# Patient Record
Sex: Female | Born: 1950
Health system: Southern US, Community
[De-identification: ages and names within clinical notes are randomized; demographics above are authoritative.]

## PROBLEM LIST (undated history)

## (undated) DIAGNOSIS — I447 Left bundle-branch block, unspecified: Secondary | ICD-10-CM

## (undated) DIAGNOSIS — K589 Irritable bowel syndrome without diarrhea: Secondary | ICD-10-CM

## (undated) DIAGNOSIS — K219 Gastro-esophageal reflux disease without esophagitis: Secondary | ICD-10-CM

## (undated) DIAGNOSIS — I10 Essential (primary) hypertension: Secondary | ICD-10-CM

## (undated) DIAGNOSIS — E039 Hypothyroidism, unspecified: Secondary | ICD-10-CM

## (undated) DIAGNOSIS — N183 Chronic kidney disease, stage 3 unspecified: Secondary | ICD-10-CM

## (undated) DIAGNOSIS — Z8673 Personal history of transient ischemic attack (TIA), and cerebral infarction without residual deficits: Secondary | ICD-10-CM

## (undated) DIAGNOSIS — G43909 Migraine, unspecified, not intractable, without status migrainosus: Secondary | ICD-10-CM

## (undated) DIAGNOSIS — E785 Hyperlipidemia, unspecified: Secondary | ICD-10-CM

## (undated) DIAGNOSIS — F429 Obsessive-compulsive disorder, unspecified: Secondary | ICD-10-CM

## (undated) DIAGNOSIS — I639 Cerebral infarction, unspecified: Secondary | ICD-10-CM

## (undated) DIAGNOSIS — E782 Mixed hyperlipidemia: Secondary | ICD-10-CM

## (undated) DIAGNOSIS — N390 Urinary tract infection, site not specified: Secondary | ICD-10-CM

## (undated) DIAGNOSIS — K582 Mixed irritable bowel syndrome: Secondary | ICD-10-CM

## (undated) DIAGNOSIS — J302 Other seasonal allergic rhinitis: Secondary | ICD-10-CM

## (undated) DIAGNOSIS — Z87448 Personal history of other diseases of urinary system: Secondary | ICD-10-CM

## (undated) DIAGNOSIS — R7303 Prediabetes: Secondary | ICD-10-CM

## (undated) DIAGNOSIS — K5790 Diverticulosis of intestine, part unspecified, without perforation or abscess without bleeding: Secondary | ICD-10-CM

## (undated) DIAGNOSIS — Z8719 Personal history of other diseases of the digestive system: Secondary | ICD-10-CM

## (undated) DIAGNOSIS — K449 Diaphragmatic hernia without obstruction or gangrene: Secondary | ICD-10-CM

## (undated) DIAGNOSIS — K4020 Bilateral inguinal hernia, without obstruction or gangrene, not specified as recurrent: Secondary | ICD-10-CM

## (undated) DIAGNOSIS — K5730 Diverticulosis of large intestine without perforation or abscess without bleeding: Secondary | ICD-10-CM

## (undated) DIAGNOSIS — G4733 Obstructive sleep apnea (adult) (pediatric): Secondary | ICD-10-CM

## (undated) DIAGNOSIS — F909 Attention-deficit hyperactivity disorder, unspecified type: Secondary | ICD-10-CM

## (undated) DIAGNOSIS — G459 Transient cerebral ischemic attack, unspecified: Secondary | ICD-10-CM

## (undated) DIAGNOSIS — M8580 Other specified disorders of bone density and structure, unspecified site: Secondary | ICD-10-CM

## (undated) DIAGNOSIS — T7840XA Allergy, unspecified, initial encounter: Secondary | ICD-10-CM

## (undated) DIAGNOSIS — I509 Heart failure, unspecified: Secondary | ICD-10-CM

## (undated) DIAGNOSIS — J439 Emphysema, unspecified: Secondary | ICD-10-CM

## (undated) DIAGNOSIS — H269 Unspecified cataract: Secondary | ICD-10-CM

## (undated) DIAGNOSIS — K648 Other hemorrhoids: Secondary | ICD-10-CM

## (undated) DIAGNOSIS — A419 Sepsis, unspecified organism: Secondary | ICD-10-CM

## (undated) DIAGNOSIS — N3010 Interstitial cystitis (chronic) without hematuria: Secondary | ICD-10-CM

## (undated) DIAGNOSIS — D649 Anemia, unspecified: Secondary | ICD-10-CM

## (undated) DIAGNOSIS — A048 Other specified bacterial intestinal infections: Secondary | ICD-10-CM

## (undated) DIAGNOSIS — Z87440 Personal history of urinary (tract) infections: Secondary | ICD-10-CM

## (undated) DIAGNOSIS — Z8619 Personal history of other infectious and parasitic diseases: Secondary | ICD-10-CM

## (undated) DIAGNOSIS — I5032 Chronic diastolic (congestive) heart failure: Secondary | ICD-10-CM

## (undated) DIAGNOSIS — M810 Age-related osteoporosis without current pathological fracture: Secondary | ICD-10-CM

## (undated) DIAGNOSIS — N289 Disorder of kidney and ureter, unspecified: Secondary | ICD-10-CM

## (undated) DIAGNOSIS — F419 Anxiety disorder, unspecified: Secondary | ICD-10-CM

## (undated) DIAGNOSIS — F32A Depression, unspecified: Secondary | ICD-10-CM

## (undated) DIAGNOSIS — G4730 Sleep apnea, unspecified: Secondary | ICD-10-CM

## (undated) DIAGNOSIS — E559 Vitamin D deficiency, unspecified: Secondary | ICD-10-CM

## (undated) DIAGNOSIS — E079 Disorder of thyroid, unspecified: Secondary | ICD-10-CM

## (undated) HISTORY — DX: Unspecified cataract: H26.9

## (undated) HISTORY — PX: AUGMENTATION MAMMAPLASTY: SUR837

## (undated) HISTORY — DX: Anxiety disorder, unspecified: F41.9

## (undated) HISTORY — PX: COLON SURGERY: SHX602

## (undated) HISTORY — DX: Vitamin D deficiency, unspecified: E55.9

## (undated) HISTORY — DX: Prediabetes: R73.03

## (undated) HISTORY — DX: Allergy, unspecified, initial encounter: T78.40XA

## (undated) HISTORY — DX: Heart failure, unspecified: I50.9

## (undated) HISTORY — DX: Anemia, unspecified: D64.9

## (undated) HISTORY — PX: BREAST ENHANCEMENT SURGERY: SHX7

## (undated) HISTORY — DX: Personal history of other diseases of the digestive system: Z87.19

## (undated) HISTORY — PX: OOPHORECTOMY: SHX86

## (undated) HISTORY — DX: Migraine, unspecified, not intractable, without status migrainosus: G43.909

## (undated) HISTORY — DX: Cerebral infarction, unspecified: I63.9

## (undated) HISTORY — DX: Attention-deficit hyperactivity disorder, unspecified type: F90.9

## (undated) HISTORY — DX: Hyperlipidemia, unspecified: E78.5

## (undated) HISTORY — PX: HERNIA REPAIR: SHX51

## (undated) HISTORY — DX: Transient cerebral ischemic attack, unspecified: G45.9

## (undated) HISTORY — PX: ABDOMINAL HYSTERECTOMY: SHX81

## (undated) HISTORY — DX: Other specified bacterial intestinal infections: A04.8

## (undated) HISTORY — DX: Age-related osteoporosis without current pathological fracture: M81.0

## (undated) HISTORY — PX: BREAST SURGERY: SHX581

## (undated) HISTORY — PX: COSMETIC SURGERY: SHX468

## (undated) HISTORY — PX: FRACTURE SURGERY: SHX138

## (undated) HISTORY — DX: Gastro-esophageal reflux disease without esophagitis: K21.9

## (undated) HISTORY — PX: TONSILLECTOMY: SUR1361

## (undated) HISTORY — DX: Emphysema, unspecified: J43.9

## (undated) HISTORY — PX: TUBAL LIGATION: SHX77

## (undated) HISTORY — DX: Disorder of thyroid, unspecified: E07.9

## (undated) HISTORY — PX: EYE SURGERY: SHX253

---

## 1974-04-27 HISTORY — PX: HEMORRHOID SURGERY: SHX153

## 1995-04-28 HISTORY — PX: BUNIONECTOMY: SHX129

## 1997-08-31 ENCOUNTER — Ambulatory Visit (HOSPITAL_COMMUNITY): Admission: RE | Admit: 1997-08-31 | Discharge: 1997-08-31 | Payer: Self-pay | Admitting: Internal Medicine

## 1998-05-01 ENCOUNTER — Ambulatory Visit (HOSPITAL_COMMUNITY): Admission: RE | Admit: 1998-05-01 | Discharge: 1998-05-01 | Payer: Self-pay | Admitting: Internal Medicine

## 1998-05-01 ENCOUNTER — Encounter: Payer: Self-pay | Admitting: Internal Medicine

## 1998-05-09 ENCOUNTER — Encounter: Payer: Self-pay | Admitting: Internal Medicine

## 1998-05-09 ENCOUNTER — Ambulatory Visit (HOSPITAL_COMMUNITY): Admission: RE | Admit: 1998-05-09 | Discharge: 1998-05-09 | Payer: Self-pay | Admitting: Internal Medicine

## 1998-05-20 ENCOUNTER — Ambulatory Visit (HOSPITAL_COMMUNITY): Admission: RE | Admit: 1998-05-20 | Discharge: 1998-05-20 | Payer: Self-pay | Admitting: Internal Medicine

## 1998-12-17 ENCOUNTER — Encounter: Payer: Self-pay | Admitting: Internal Medicine

## 1998-12-17 ENCOUNTER — Ambulatory Visit (HOSPITAL_COMMUNITY): Admission: RE | Admit: 1998-12-17 | Discharge: 1998-12-17 | Payer: Self-pay | Admitting: Internal Medicine

## 1999-01-23 ENCOUNTER — Ambulatory Visit (HOSPITAL_COMMUNITY): Admission: RE | Admit: 1999-01-23 | Discharge: 1999-01-23 | Payer: Self-pay | Admitting: Internal Medicine

## 1999-01-23 ENCOUNTER — Encounter: Payer: Self-pay | Admitting: Internal Medicine

## 1999-08-09 ENCOUNTER — Ambulatory Visit: Admission: RE | Admit: 1999-08-09 | Discharge: 1999-08-09 | Payer: Self-pay | Admitting: Neurology

## 2000-03-29 ENCOUNTER — Encounter: Payer: Self-pay | Admitting: *Deleted

## 2000-04-02 ENCOUNTER — Inpatient Hospital Stay (HOSPITAL_COMMUNITY): Admission: RE | Admit: 2000-04-02 | Discharge: 2000-04-03 | Payer: Self-pay | Admitting: *Deleted

## 2000-04-02 ENCOUNTER — Encounter (INDEPENDENT_AMBULATORY_CARE_PROVIDER_SITE_OTHER): Payer: Self-pay | Admitting: Specialist

## 2001-09-30 ENCOUNTER — Encounter: Payer: Self-pay | Admitting: Internal Medicine

## 2001-09-30 ENCOUNTER — Encounter: Admission: RE | Admit: 2001-09-30 | Discharge: 2001-09-30 | Payer: Self-pay | Admitting: Internal Medicine

## 2001-10-10 ENCOUNTER — Encounter: Payer: Self-pay | Admitting: Internal Medicine

## 2001-10-10 ENCOUNTER — Ambulatory Visit (HOSPITAL_COMMUNITY): Admission: RE | Admit: 2001-10-10 | Discharge: 2001-10-10 | Payer: Self-pay | Admitting: Internal Medicine

## 2002-10-05 ENCOUNTER — Encounter: Payer: Self-pay | Admitting: Internal Medicine

## 2002-10-05 ENCOUNTER — Ambulatory Visit (HOSPITAL_COMMUNITY): Admission: RE | Admit: 2002-10-05 | Discharge: 2002-10-05 | Payer: Self-pay | Admitting: Internal Medicine

## 2002-10-09 ENCOUNTER — Encounter: Payer: Self-pay | Admitting: Internal Medicine

## 2002-10-09 ENCOUNTER — Ambulatory Visit (HOSPITAL_COMMUNITY): Admission: RE | Admit: 2002-10-09 | Discharge: 2002-10-09 | Payer: Self-pay | Admitting: Internal Medicine

## 2003-04-28 HISTORY — PX: CATARACT EXTRACTION W/ INTRAOCULAR LENS IMPLANT: SHX1309

## 2003-08-30 ENCOUNTER — Ambulatory Visit (HOSPITAL_COMMUNITY): Admission: RE | Admit: 2003-08-30 | Discharge: 2003-08-30 | Payer: Self-pay | Admitting: Internal Medicine

## 2004-06-11 ENCOUNTER — Encounter: Admission: RE | Admit: 2004-06-11 | Discharge: 2004-06-11 | Payer: Self-pay | Admitting: Internal Medicine

## 2005-08-13 ENCOUNTER — Ambulatory Visit (HOSPITAL_COMMUNITY): Admission: RE | Admit: 2005-08-13 | Discharge: 2005-08-13 | Payer: Self-pay | Admitting: Internal Medicine

## 2005-08-26 ENCOUNTER — Encounter: Payer: Self-pay | Admitting: Internal Medicine

## 2006-02-25 ENCOUNTER — Ambulatory Visit: Payer: Self-pay | Admitting: Gastroenterology

## 2006-03-25 ENCOUNTER — Ambulatory Visit: Payer: Self-pay | Admitting: Gastroenterology

## 2006-04-05 ENCOUNTER — Ambulatory Visit: Payer: Self-pay | Admitting: Gastroenterology

## 2007-11-17 ENCOUNTER — Ambulatory Visit: Payer: Self-pay | Admitting: Surgery

## 2007-11-17 ENCOUNTER — Encounter (INDEPENDENT_AMBULATORY_CARE_PROVIDER_SITE_OTHER): Payer: Self-pay | Admitting: Family Medicine

## 2007-11-17 ENCOUNTER — Ambulatory Visit: Admission: RE | Admit: 2007-11-17 | Discharge: 2007-11-17 | Payer: Self-pay | Admitting: Family Medicine

## 2008-05-03 ENCOUNTER — Ambulatory Visit (HOSPITAL_COMMUNITY): Admission: RE | Admit: 2008-05-03 | Discharge: 2008-05-03 | Payer: Self-pay | Admitting: Internal Medicine

## 2008-05-15 ENCOUNTER — Encounter: Admission: RE | Admit: 2008-05-15 | Discharge: 2008-05-15 | Payer: Self-pay | Admitting: Internal Medicine

## 2010-04-27 DIAGNOSIS — Z8619 Personal history of other infectious and parasitic diseases: Secondary | ICD-10-CM

## 2010-04-27 HISTORY — DX: Personal history of other infectious and parasitic diseases: Z86.19

## 2010-09-12 NOTE — H&P (Signed)
[facility]  Patient:    [name], [name] [facility]                     MRN: [id number] Adm. Date:  [id number] Attending:  Donne Hazel                         History and Physical  HISTORY OF PRESENT ILLNESS:  [name] is a 60 year old female, postmenopausal. She is admitted at this time to undergo total vaginal hysterectomy for a history of abnormal uterine bleeding.  The patient has been unresponsive to conservative medical management regarding her abnormal uterine bleeding.  She requested definitive therapy in the form of vaginal hysterectomy.  Also, the patient was having some urinary symptoms which were evaluated by Dr. Verl Dicker; he felt that this was primarily an urge incontinence problem and felt that the patient would not benefit from surgical repair.  She is on multiple medications.  MEDICAL HISTORY 1. History of left bundle branch block. 2. History of depression. 3. History of hypothyroidism. 4. History of irritable bowel syndrome. 5. History of interstitial cystitis.  SURGICAL HISTORY 1. History of foot surgery in 1997. 2. Hemorrhoidectomy in 1976. 3. History of breast augmentation. 4. Bilateral tubal ligation in 1976. 5. History of tonsillectomy.  OBSTETRICAL HISTORY:  Normal spontaneous vaginal deliveries x 3 at term.  CURRENT MEDICATIONS:  Current medications are multiple; please see clinic chart.  ALLERGIES:  SULFA, PLASTIC TAPE, CODEINE and CEPHALOSPORINS.  SOCIAL HISTORY:  Positive for smoking one pack per day with 30 years of use.  PHYSICAL EXAMINATION  VITAL SIGNS:  Stable, temperature 98.1, pulse 64, respirations 14, blood pressure 116/74.  GENERAL:  She is a well-developed, well-nourished female in no acute distress.  HEENT:  Within normal limits.  NECK:  Supple without adenopathy or thyromegaly.  HEART:  Regular rate and rhythm without murmur, gallop or rub.  LUNGS:  Clear with decreased breath sounds  throughout.  BREASTS:  Exam is consistent with her previous breast augmentation surgery but are without masses, nipple discharge or axillary adenopathy.  ABDOMEN:  Soft and benign, without masses, tenderness, hernia or organomegaly.  PELVIC:  Normal external female genitalia noted.  Vagina and cervix clear. The uterus is upper limits of normal, freely mobile and nontender.  Adnexa free of mass.  ADMITTING DIAGNOSES 1. Abnormal uterine bleeding. 2. Multiple medical problems.  PLAN:  Total vaginal hysterectomy with bilateral salpingo-oophorectomy.  DISCUSSION:  The risks and benefits of this surgery have been explained at length to the patient.  Goals of therapy and the surgery in detail were explained.  The risks of bleeding, infection and risk of injury to surrounding organs were reviewed.  The patient was allowed to ask questions and wished to proceed. DD:  04/02/00 TD:  04/02/00 Job: [id number]

## 2010-09-12 NOTE — Discharge Summary (Signed)
[facility]  Patient:    [name], [name] [facility]                     MRN: [id number] Adm. Date:  [id number] Disc. Date: [id number] Attending:  Donne Hazel                           Discharge Summary  HISTORY OF PRESENT ILLNESS:  [name] is a 60 year old postmenopausal female admitted to undergo total vaginal hysterectomy with optional bilateral oophorectomy.  She has had irregular bleeding and request a definitive treatment of this, and request hysterectomy.  She also request oophorectomy.  PAST MEDICAL HISTORY: 1. History of left bundle branch block. 2. History of depression. 3. History of hypothyroidism. 4. History of irritable bowel syndrome. 5. History of interstitial cystitis.  PAST SURGICAL HISTORY: 1. Foot surgery in 1997. 2. Hemorrhoidectomy in 1976. 3. History of breast augmentation. 4. Bilateral tubal ligation in 1976. 5. History of tonsillectomy.  OBSTETRICAL HISTORY:  Normal spontaneous vaginal deliveries at term.  CURRENT MEDICATIONS:  Multiple.  ALLERGIES:  SULFA, PLASTIC TAPE, CODEINE, CEPHALOSPORINS.  SOCIAL HISTORY:  Positive for smoking.  PHYSICAL EXAMINATION:  Please see clinic admission history and physical.  ADMISSION DIAGNOSIS:  Abnormal uterine bleeding.  HOSPITAL COURSE:  The patient was admitted on the same day of surgery on April 02, 2000, where she underwent a total vaginal hysterectomy and bilateral salpingo-oophorectomy.  The surgery went well, and blood loss was 100 cc.  The patient stayed in the hospital a little over 24 hours.  She strongly requested discharge on postoperative day #1.  Her hemoglobin was stable at 11.7.  She resumed normal bowel and bladder function, and was ambulatory.  She is discharged in stable condition after only a 24 hour visit after her surgery.  She did very well without complications postoperatively.  DISCHARGE DIAGNOSES: 1. Abnormal uterine bleeding, now status post total  vaginal hysterectomy with bilateral salpingo-oophorectomy. 2. Stable postoperative course.  PLAN: 1. Home. 2. Routine postoperative instructions given. 3. Demerol #40. 4. Motrin #30. 5. Phenergan #20. 6. Follow up in the office in one week for a routine postoperative checkup. DD:  05/10/00 TD:  05/10/00 Job: [id number]

## 2010-09-12 NOTE — Op Note (Signed)
[facility]  Patient:    [name], [name] [facility]                     MRN: [id number] Proc. Date: 04/02/00 Adm. Date:  [id number] Attending:  Donne Hazel                           Operative Report  PREOPERATIVE DIAGNOSIS:  Abnormal uterine bleeding.  POSTOPERATIVE DIAGNOSES:  Abnormal uterine bleeding.  PROCEDURE:  Total vaginal hysterectomy with bilateral salpingo-oophorectomy.  SURGEON:  Willey Blade, M.D.  ASSISTANT:  Juluis Mire, M.D.  ANESTHESIA:  General endotracheal.  ESTIMATED BLOOD LOSS:  100 cc.  COMPLICATIONS:  None.  FINDINGS AT TIME OF HYSTERECTOMY:  The uterus was visualized and noted to be quite boggy, consistent with adenomyosis.  This was also the upper limits of normal.  The adnexa was visualized at the time of surgery and noted to be normal.  DESCRIPTION OF PROCEDURE:  The patient was taken to the operating room where a general endotracheal anesthetic was administered.  The patient was placed on the operating table in the dorsal lithotomy position; the perineum and vagina was prepped and draped in the usual sterile fashion with Betadine and sterile drapes.  The patient received a preoperative antibiotic.  A weighted speculum was placed in the posterior vernix of the vagina, and the cervix was grasped with a Christella Hartigan tenaculum.  The posterior cul-de-sac was sharply entered and atraumatically entered without difficulty.  The uterosacral ligaments were then clamped bilaterally with curved Heaney clamps.  The vascular pedicle was sharply created and suture ligated with a transfixing suture of 0 Vicryl. These ligatures were held for later use.  A circumferential incision was then made around the anterior portion of the cervix sharply.  The bladder flap was developed sharply and bluntly over the lower uterine segment and advanced cephalad.  A bladder blade was placed behind the bladder to ensure its protection during the  procedure.  Successful bites were then carried up the body of the uterus, all hugging the uterus very intently; all vascular pedicles were clamped with curved Heaney clamps and sharply created.  These were suture ligated with 0 Vicryl suture.  The anterior cul-de-sac was then atraumatically entered and a bladder blade placed behind the bladder.  The dissection was then continued up the broad ligament hugging the uterus until the fundal region was met.  This dissection included the vascular pedicles of the uterine artery pedicles.  These were suture ligated with 0 Vicryl suture as well.  The fundus was eventually met.  The fundus was then delivered posteriorly through the incision with the assistance of thyroid tenacula.  The uterine ovarian ligament was then clamped with Heaney clamps bilaterally and the uterus dissected free.  The infundibulopelvic ligaments were then clamped bilaterally proximal to the ovaries and the ovaries dissected free.  This was done without difficulty.  The infundibulopelvic ligaments were then doubly ligated bilaterally with first a free-tie of 0 Vicryl suture.  A transfixing 0 Vicryl tie was then placed distal to the previous tie, and thus the infundibulopelvic pelvic ligaments were doubly ligated bilaterally.  Good hemostasis was noted from these operative areas.  Attention was then turned to closure of the vagina.  Next, the uterosacral ligaments were plicated in the midline with two suture of 0 Vicryl.  This was done to obliterate the posterior cul-de-sac.  The two existing ligatures on the uterosacral  ligaments were then tied together for cuff support.  Next, the vagina was then closed with multiple figure-of-eight sutures of 0 Vicryl.  This was done in an anterior to posterior fashion.  Good anatomical reapproximation was took place.  The vagina was closed completely. A Foley catheter was then inserted with clear urine obtained.  All vaginal instruments  were then removed, and the patient awakened, extubated, and taken to the recovery room in good condition.  Final sponge, needle and instrument counts were correct x 3.  There were no perioperative complications. DD:  04/02/00 TD:  04/02/00 Job: [id number]

## 2010-09-12 NOTE — Assessment & Plan Note (Signed)
[facility]                           GASTROENTEROLOGY OFFICE NOTE   RIAH, KEHOE                       MRN:          [id number]  DATE:02/25/2006                            DOB:          February 23, 1951    REASON FOR REFERRAL:  Change in bowel habits with intermittent incontinence  and a family history of colon polyps.   HISTORY OF PRESENT ILLNESS:  Ms. Parsley is a 60 year old white female that I  have previously evaluated.  She has a long history of alternating diarrhea  and constipation associated with mild gas and bloating.  Her symptoms have  been relatively inactive for the past several years, and she generally has  normal bowel movements.  Her mother had colon polyps at about age 82.  No  other family members with colon cancer, colon polyps or inflammatory bowel  disease.  She underwent colonoscopy in July 1999 which was only complete to  the hepatic flexure.  The colon was very tortuous and lax.  The exam was  normal but incomplete.  She relates problems with occasionally having  incontinence of mucus per rectum intermittently for the past several months.  She also occasionally has a small amount of fecal incontinence a short while  after a bowel movement.  She has a known rectocele and cystocele that were  diagnosed several years ago.  She has not been for GYN followup since then.  She has had recurrent problems with urinary tract infections as well.  She  notes no change in stool caliber, abdominal pain, rectal pain, melena or  hematochezia.   PAST MEDICAL HISTORY:  1. Hypertension.  2. Gastroesophageal reflux disease.  3. Irritable bowel syndrome.  4. Depression.  5. Hypothyroidism.  6. Attention deficit disorder.  7. Obsessive-compulsive disorder.  8. Recurrent urinary tract infections.  9. Interstitial cystitis.  10.Status post hemorrhoidectomy.  11.Status post hysterectomy.  12.Status post tubal ligation.  13.Status post  bunionectomy.  14.Sleep apnea.  15.Status post breast surgery.  16.Left bundle branch block.  17.Allergies.  18.Depression.   MEDICATION ALLERGIES:  1. KEFLEX.  2. SULFA.  3. MACRODANTIN.  4. INTOLERANCE TO CODEINE AND DIET PILLS.   CURRENT MEDICATIONS:  Listed on the chart.  Updated and reviewed.   SOCIAL HISTORY:  Per the handwritten form.   REVIEW OF SYMPTOMS:  Per the handwritten form.   PHYSICAL EXAMINATION:  GENERAL:  Well-developed, well-nourished white  female, in no acute distress.  VITAL SIGNS:  Height 5 feet 5 inches.  Weight 178 pounds.  Blood pressure is  108/72.  Pulse 60 and regular.  HEENT:  Anicteric sclerae.  Oropharynx clear.  CHEST:  Clear to auscultation bilaterally.  CARDIAC:  Regular rate and rhythm without murmurs appreciated.  ABDOMEN:  Soft, non-tender, non-distended.  Normoactive bowel sounds.  No  palpable organomegaly, masses or hernias.  RECTAL:  Examination deferred to time of colonoscopy.  EXTREMITIES:  No clubbing, cyanosis or edema.  NEUROLOGIC:  Alert and oriented x3.  Grossly nonfocal.   Laboratory data from January 26, 2006:  Normal CBC.  Urinalysis consistent  with a urinary tract  infection.  Urine culture showing greater than 100,000  colonies of Escherichia coli.   ASSESSMENT AND PLAN:  1. Change in bowel habits with mucus per rectum and occasional amounts of      fecal incontinence following bowel movements.  First degree relative      with colon polyps at age 40.  History of a rectocele and cystocele.  I      suspect her symptoms are coming from a rectocele.  Other anorectal      pathology needs to be excluded.  Colorectal neoplasms to be excluded.      Risks, benefits and alternatives to colonoscopy with possible biopsy      and possible polypectomy discussed with the patient, and she consents      to proceed.  This will be scheduled electively.  If I am not successful      in reaching the cecum, we will plan to obtain an  air-contrasted barium      enema to evaluate the remainder of the colon.  I have advised her to      schedule followup appointment with her gynecologist.  I have also      advised her to begin a daily fiber supplement to help with the mucus      per rectum.     Venita Lick. Russella Dar, MD, [facility]  Electronically Signed    MTS/MedQ  DD: 02/25/2006  DT: 02/25/2006  Job #: [id number]   cc:   Lucky Cowboy, M.D.

## 2010-09-25 ENCOUNTER — Other Ambulatory Visit: Payer: Self-pay | Admitting: Internal Medicine

## 2010-09-25 DIAGNOSIS — Z1231 Encounter for screening mammogram for malignant neoplasm of breast: Secondary | ICD-10-CM

## 2010-10-24 ENCOUNTER — Ambulatory Visit
Admission: RE | Admit: 2010-10-24 | Discharge: 2010-10-24 | Disposition: A | Discharge status: Home / Self Care | Payer: Commercial Managed Care - PPO | Source: Ambulatory Visit | Attending: Internal Medicine | Admitting: Internal Medicine

## 2010-10-24 DIAGNOSIS — Z1231 Encounter for screening mammogram for malignant neoplasm of breast: Secondary | ICD-10-CM

## 2011-03-20 ENCOUNTER — Other Ambulatory Visit: Payer: Self-pay | Admitting: Family Medicine

## 2011-03-20 ENCOUNTER — Emergency Department
Admission: EM | Admit: 2011-03-20 | Discharge: 2011-03-20 | Disposition: A | Discharge status: Home / Self Care | Payer: Commercial Managed Care - PPO | Source: Home / Self Care | Attending: Family Medicine | Admitting: Family Medicine

## 2011-03-20 ENCOUNTER — Encounter: Payer: Self-pay | Admitting: Emergency Medicine

## 2011-03-20 DIAGNOSIS — L309 Dermatitis, unspecified: Secondary | ICD-10-CM

## 2011-03-20 DIAGNOSIS — L259 Unspecified contact dermatitis, unspecified cause: Secondary | ICD-10-CM

## 2011-03-20 HISTORY — DX: Obsessive-compulsive disorder, unspecified: F42.9

## 2011-03-20 HISTORY — DX: Irritable bowel syndrome, unspecified: K58.9

## 2011-03-20 HISTORY — DX: Essential (primary) hypertension: I10

## 2011-03-20 HISTORY — DX: Depression, unspecified: F32.A

## 2011-03-20 MED ORDER — MICONAZOLE NITRATE 2 % EX CREA: TOPICAL_CREAM | Freq: Two times a day (BID) | CUTANEOUS | Status: AC

## 2011-03-20 MED ORDER — TRIAMCINOLONE ACETONIDE 0.1 % EX CREA: TOPICAL_CREAM | Freq: Two times a day (BID) | CUTANEOUS | Status: AC

## 2011-03-20 MED ORDER — DOXYCYCLINE HYCLATE 100 MG PO TABS: 100.0000 mg | ORAL_TABLET | Freq: Two times a day (BID) | ORAL | Status: AC

## 2011-03-20 NOTE — ED Notes (Signed)
Dermatitis on both hands intermittently for at least 5 years; now right thumb main problem.

## 2011-03-24 LAB — WOUND CULTURE
Gram Stain: NONE SEEN
Gram Stain: NONE SEEN

## 2011-03-24 NOTE — ED Provider Notes (Signed)
History     CSN: [id number] Arrival date & time: 03/20/2011  1:15 PM   First MD Initiated Contact with Patient 03/20/11 1418      Chief Complaint  Patient presents with  . Dermatitis     HPI Comments: Patient complains of 5 year history of recurring painful rash on right thumb, worse over the past several days.  The rash is exacerbated by frequent hand washing (she is a Engineer, civil (consulting)).  Recently there has been slight weeping from the lesions.  She states that she applies Neosporin ointment regularly.  Patient is a 60 y.o. female presenting with rash. The history is provided by the patient.  Rash  This is a chronic problem. The current episode started more than 1 week ago. The problem has been gradually worsening. The problem is associated with nothing. There has been no fever. Affected Location: Right thumb. The pain is mild. The pain has been constant since onset. Associated symptoms include weeping. She has tried antibiotic cream (Neosporin) for the symptoms.    Past Medical History  Diagnosis Date  . Hypothyroid   . Hypertension   . ADD (attention deficit disorder with hyperactivity)   . OCD (obsessive compulsive disorder)   . Depression   . IBS (irritable bowel syndrome)     Past Surgical History  Procedure Date  . Tonsillectomy   . Colon surgery   . Oophorectomy   . Bunionectomy     History reviewed. No pertinent family history.  History  Substance Use Topics  . Smoking status: Not on file  . Smokeless tobacco: Not on file  . Alcohol Use: No    OB History    Grav Para Term Preterm Abortions TAB SAB Ect Mult Living                  Review of Systems  Constitutional: Negative.   HENT: Negative.   Eyes: Negative.   Respiratory: Negative.   Cardiovascular: Negative.   Gastrointestinal: Negative.   Genitourinary: Negative.   Musculoskeletal: Negative.   Skin: Positive for rash.       She notes that there is no rash elsewherre.  Neurological: Negative.      Allergies  Codeine; Keflex; and Sulfa antibiotics  Home Medications   Current Outpatient Rx  Name Route Sig Dispense Refill  . ASPIRIN 81 MG PO CHEW Oral Chew 81 mg by mouth daily.      . ATENOLOL-CHLORTHALIDONE 100-25 MG PO TABS Oral Take 1 tablet by mouth daily.      . BUPROPION HCL ER (SR) 150 MG PO TB12 Oral Take 150 mg by mouth 2 (two) times daily.      Marland Kitchen HYOSCYAMINE SULFATE ER 0.375 MG PO TB12 Oral Take 0.375 mg by mouth every 12 (twelve) hours as needed.      Marland Kitchen LEVOTHYROXINE SODIUM 100 MCG PO TABS Oral Take 100 mcg by mouth daily.      . METHYLPHENIDATE HCL 20 MG PO TABS Oral Take 20 mg by mouth 2 (two) times daily.      Marland Kitchen DOXYCYCLINE HYCLATE 100 MG PO TABS Oral Take 1 tablet (100 mg total) by mouth 2 (two) times daily. 20 tablet 0  . MICONAZOLE NITRATE 2 % EX CREA Topical Apply topically 2 (two) times daily. 28.35 g 0  . TRIAMCINOLONE ACETONIDE 0.1 % EX CREA Topical Apply topically 2 (two) times daily. 30 g 1    BP 163/94  Pulse 83  Temp(Src) 97.6 F (36.4 C) (Oral)  Resp  16  Ht 5\' 4"  (1.626 m)  Wt 181 lb (82.101 kg)  BMI 31.07 kg/m2  SpO2 97%  Physical Exam  Nursing note and vitals reviewed. Constitutional: She appears well-developed and well-nourished. No distress.  Skin:          Skin of right thumb is somewhat macerated and moist but not swollen.  Tender to palpation, mildly erythematous.  Minute amount of discharge apparent at nail folds.  Distal neurovascular function is intact.  Finger has full range of motion all joints.    ED Course  Procedures none   Labs Reviewed  FUNGUS CULTURE W SMEAR   Narrative:    Performed at:  [facility]                [address]                [place], [place] [address]  WOUND CULTURE   Narrative:    Performed at:  [facility]                [address]                [place], [place] [address]      1. Dermatitis       MDM  Chronic  dermatitis.  Suspect a possible contact allergy to Neomycin.  Advised to discontinue OTC antibiotic creams/ointments. ? Chronic superficial fungal infection and/or bacterial infection. Will obtain bacterial culture and fungal culture. Will empirically begin oral doxycycline, topical miconazole cream, and topical triamcinolone cream. Recommend follow-up dermatologist if not improving.        Donna Christen, MD 03/24/11 [phone]

## 2011-03-27 ENCOUNTER — Telehealth: Payer: Self-pay | Admitting: Emergency Medicine

## 2011-04-17 LAB — FUNGUS CULTURE W SMEAR

## 2011-05-08 ENCOUNTER — Encounter: Payer: Self-pay | Admitting: Gastroenterology

## 2012-01-26 ENCOUNTER — Encounter: Payer: Self-pay | Admitting: Gastroenterology

## 2012-03-27 ENCOUNTER — Emergency Department (HOSPITAL_COMMUNITY): Payer: 59

## 2012-03-27 ENCOUNTER — Encounter (HOSPITAL_COMMUNITY): Payer: Self-pay | Admitting: Emergency Medicine

## 2012-03-27 ENCOUNTER — Emergency Department (HOSPITAL_COMMUNITY)
Admission: EM | Admit: 2012-03-27 | Discharge: 2012-03-27 | Disposition: A | Discharge status: Home / Self Care | Payer: 59 | Attending: Emergency Medicine | Admitting: Emergency Medicine

## 2012-03-27 DIAGNOSIS — E039 Hypothyroidism, unspecified: Secondary | ICD-10-CM | POA: Insufficient documentation

## 2012-03-27 DIAGNOSIS — R109 Unspecified abdominal pain: Secondary | ICD-10-CM | POA: Insufficient documentation

## 2012-03-27 DIAGNOSIS — Z79899 Other long term (current) drug therapy: Secondary | ICD-10-CM | POA: Insufficient documentation

## 2012-03-27 DIAGNOSIS — F329 Major depressive disorder, single episode, unspecified: Secondary | ICD-10-CM | POA: Insufficient documentation

## 2012-03-27 DIAGNOSIS — K589 Irritable bowel syndrome without diarrhea: Secondary | ICD-10-CM | POA: Insufficient documentation

## 2012-03-27 DIAGNOSIS — Z7982 Long term (current) use of aspirin: Secondary | ICD-10-CM | POA: Insufficient documentation

## 2012-03-27 DIAGNOSIS — I10 Essential (primary) hypertension: Secondary | ICD-10-CM | POA: Insufficient documentation

## 2012-03-27 DIAGNOSIS — F988 Other specified behavioral and emotional disorders with onset usually occurring in childhood and adolescence: Secondary | ICD-10-CM | POA: Insufficient documentation

## 2012-03-27 DIAGNOSIS — F3289 Other specified depressive episodes: Secondary | ICD-10-CM | POA: Insufficient documentation

## 2012-03-27 DIAGNOSIS — R079 Chest pain, unspecified: Secondary | ICD-10-CM | POA: Insufficient documentation

## 2012-03-27 DIAGNOSIS — K5730 Diverticulosis of large intestine without perforation or abscess without bleeding: Secondary | ICD-10-CM | POA: Insufficient documentation

## 2012-03-27 DIAGNOSIS — F605 Obsessive-compulsive personality disorder: Secondary | ICD-10-CM | POA: Insufficient documentation

## 2012-03-27 HISTORY — DX: Diverticulosis of intestine, part unspecified, without perforation or abscess without bleeding: K57.90

## 2012-03-27 LAB — URINALYSIS, ROUTINE W REFLEX MICROSCOPIC
Hgb urine dipstick: NEGATIVE
Leukocytes, UA: NEGATIVE
Nitrite: NEGATIVE
Protein, ur: NEGATIVE mg/dL
Specific Gravity, Urine: 1.018 (ref 1.005–1.030)
Urobilinogen, UA: 0.2 mg/dL (ref 0.0–1.0)

## 2012-03-27 LAB — COMPREHENSIVE METABOLIC PANEL
ALT: 13 U/L (ref 0–35)
CO2: 24 mEq/L (ref 19–32)
Calcium: 9 mg/dL (ref 8.4–10.5)
Creatinine, Ser: 0.88 mg/dL (ref 0.50–1.10)
GFR calc Af Amer: 81 mL/min — ABNORMAL LOW (ref >90)
GFR calc non Af Amer: 70 mL/min — ABNORMAL LOW (ref >90)
Glucose, Bld: 107 mg/dL — ABNORMAL HIGH (ref 70–99)
Sodium: 137 mEq/L (ref 135–145)
Total Protein: 6.9 g/dL (ref 6.0–8.3)

## 2012-03-27 LAB — CBC WITH DIFFERENTIAL/PLATELET
Basophils Absolute: 0 10*3/uL (ref 0.0–0.1)
Eosinophils Relative: 5 % (ref 0–5)
Lymphocytes Relative: 35 % (ref 12–46)
Lymphs Abs: 1.9 10*3/uL (ref 0.7–4.0)
MCV: 88 fL (ref 78.0–100.0)
Neutro Abs: 2.9 10*3/uL (ref 1.7–7.7)
Neutrophils Relative %: 52 % (ref 43–77)
Platelets: 279 10*3/uL (ref 150–400)
RBC: 4.26 MIL/uL (ref 3.87–5.11)
RDW: 12.5 % (ref 11.5–15.5)
WBC: 5.5 10*3/uL (ref 4.0–10.5)

## 2012-03-27 LAB — LIPASE, BLOOD: Lipase: 22 U/L (ref 11–59)

## 2012-03-27 LAB — POCT I-STAT TROPONIN I: Troponin i, poc: 0 ng/mL (ref 0.00–0.08)

## 2012-03-27 MED ORDER — SODIUM CHLORIDE 0.9 % IV SOLN
1000.0000 mL | Freq: Once | INTRAVENOUS | Status: AC
  Administered 2012-03-27: 1000 mL via INTRAVENOUS

## 2012-03-27 MED ORDER — SODIUM CHLORIDE 0.9 % IV SOLN: 1000.0000 mL | INTRAVENOUS | Status: DC

## 2012-03-27 MED ORDER — ONDANSETRON HCL 4 MG/2ML IJ SOLN
4.0000 mg | Freq: Once | INTRAMUSCULAR | Status: AC
  Administered 2012-03-27: 4 mg via INTRAVENOUS
  Filled 2012-03-27: qty 2

## 2012-03-27 MED ORDER — PANTOPRAZOLE SODIUM 40 MG PO TBEC: 40.0000 mg | DELAYED_RELEASE_TABLET | Freq: Every day | ORAL | Status: DC

## 2012-03-27 NOTE — ED Notes (Signed)
States has had epigastric pain radiating to back, started yesterday, had some nausea and vomiting. States it feels like irritable bowel episode that she has had in past. Has hx irritable bowel, recently had a tooth pulled. On 1-10 scale, pain at a 3.

## 2012-03-27 NOTE — ED Notes (Signed)
Patient transported to X-ray 

## 2012-03-27 NOTE — ED Provider Notes (Signed)
History    CSN: [id number] Arrival date & time 03/27/12  4696 First MD Initiated Contact with Patient 03/27/12 [phone]     Chief Complaint  Patient presents with  . Chest Pain  . Abdominal Pain   HPI Pt presents with epigastric abdominal pain that started last evening before going to work.  It started with her having some nausea and vomiting.  That was preceded with GERD type burping and discomfort.  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  The symptoms escalated during the evening and the pain increased to 8/10 and was aching and uncomfortable.  They have been pretty constant since about midnight but varying in intensity.  She took excedrin migraine and levsin with some improvement although they increased again as she became more active again.   No burning type sensation.  Some nausea but no shortness of breath.  Deep breathing, relexation has helped somewhat.  Pt has history of LBBB but no heart disease.  She does have IBS and feels that this is similar to her prior episodes but she has not had any symptoms in a while.  She wants to make sure this is not related to her heart.   Past Medical History  Diagnosis Date  . Hypothyroid   . Hypertension   . ADD (attention deficit disorder with hyperactivity)   . OCD (obsessive compulsive disorder)   . Depression   . IBS (irritable bowel syndrome)   . Diverticulosis     Past Surgical History  Procedure Date  . Tonsillectomy   . Oophorectomy   . Bunionectomy   . Colon surgery     colonoscopy    History reviewed. No pertinent family history.  History  Substance Use Topics  . Smoking status: Not on file  . Smokeless tobacco: Not on file  . Alcohol Use: No    OB History    Grav Para Term Preterm Abortions TAB SAB Ect Mult Living                  Review of Systems  All other systems reviewed and are negative.    Allergies  Cephalexin; Codeine; and Sulfa antibiotics  Home Medications    Current Outpatient Rx  Name  Route  Sig  Dispense  Refill  . ASPIRIN 81 MG PO CHEW   Oral   Chew 81 mg by mouth daily.           . ATENOLOL-CHLORTHALIDONE 100-25 MG PO TABS   Oral   Take 1 tablet by mouth daily.           . BUPROPION HCL ER (SR) 150 MG PO TB12   Oral   Take 150 mg by mouth 2 (two) times daily.           Marland Kitchen HYOSCYAMINE SULFATE ER 0.375 MG PO TB12   Oral   Take 0.375 mg by mouth every 12 (twelve) hours as needed.           Marland Kitchen LEVOTHYROXINE SODIUM 100 MCG PO TABS   Oral   Take 100 mcg by mouth daily.           . METHYLPHENIDATE HCL 20 MG PO TABS   Oral   Take 20 mg by mouth 2 (two) times daily.             There were no vitals taken for this visit.  Physical Exam  Nursing note and vitals reviewed.  Constitutional: She appears well-developed and well-nourished. No distress.  HENT:  Head: Normocephalic and atraumatic.  Right Ear: External ear normal.  Left Ear: External ear normal.  Eyes: Conjunctivae normal are normal. Right eye exhibits no discharge. Left eye exhibits no discharge. No scleral icterus.  Neck: Neck supple. No tracheal deviation present.  Cardiovascular: Normal rate, regular rhythm and intact distal pulses.   Pulmonary/Chest: Effort normal and breath sounds normal. No stridor. No respiratory distress. She has no wheezes. She has no rales.  Abdominal: Soft. Bowel sounds are normal. She exhibits no distension. There is no tenderness. There is no rebound and no guarding.  Musculoskeletal: She exhibits no edema and no tenderness.  Neurological: She is alert. She has normal strength. No sensory deficit. Cranial nerve deficit:  no gross defecits noted. She exhibits normal muscle tone. She displays no seizure activity. Coordination normal.  Skin: Skin is warm and dry. No rash noted.  Psychiatric: She has a normal mood and affect.    ED Course  Procedures (including critical care time) 7:47 AM PT does not want any medications at this  time.   Rate: 63  Rhythm: normal sinus rhythm  QRS Axis: normal  Intervals: normal  ST/T Wave abnormalities: normal  Conduction Disutrbances:LBBB  Narrative Interpretation: LBBB  Old EKG Reviewed: none available  Labs Reviewed  COMPREHENSIVE METABOLIC PANEL - Abnormal; Notable for the following:    Glucose, Bld 107 (*)     GFR calc non Af Amer 70 (*)     GFR calc Af Amer 81 (*)     All other components within normal limits  LIPASE, BLOOD  URINALYSIS, ROUTINE W REFLEX MICROSCOPIC  CBC WITH DIFFERENTIAL  POCT I-STAT TROPONIN I   Dg Abd Acute W/chest  03/27/2012  *RADIOLOGY REPORT*  Clinical Data: Abdominal pain.  Shortness of breath.  History of irritable bowel syndrome.  ACUTE ABDOMEN SERIES (ABDOMEN 2 VIEW & CHEST 1 VIEW)  Comparison: None.  Findings: Bowel gas pattern unremarkable without evidence of obstruction or significant ileus.  No evidence of free air or significant air fluid levels on the erect image.  Moderate stool burden throughout colon.  Numerous pelvic phleboliths.  No visible opaque urinary tract calculi.  Degenerative changes involving the lower lumbar spine.  Cardiomediastinal silhouette unremarkable.   Lungs clear. Bronchovascular markings normal.  Pulmonary vascularity normal.  No pneumothorax.  No pleural effusions.  Bilateral breast prostheses.  IMPRESSION: Acute abdominal or pulmonary abnormalities.   Original Report Authenticated By: Hulan Saas, M.D.      1. Chest pain       MDM  CP TIMI 1.  Low risk for ACS.  Hx of LBBB which is present on current ECG. Negative Sgarbossa criteria.  Pt had approx 8 hours of constant waxing and waning pain.  With her normal troponin, GI symptoms,  doubt cardiac etiology.  ABD pain Benign exam.  Nl labs and xrays.  Likely related to her IBS, possibly GERD  Will dc home.  Rec close follow up.  Return to the ED for recurrent symptoms.  Will continue hyoscyamine.  Consider adding antacid.       Celene Kras,  MD 03/27/12 [phone]

## 2012-03-27 NOTE — ED Notes (Signed)
O2 sats NOT 79%- pt washed hands, making sensor wet, not reading correctly. O2 sat- 100% on rm air

## 2012-04-27 HISTORY — PX: COLONOSCOPY: SHX174

## 2012-06-11 ENCOUNTER — Other Ambulatory Visit: Payer: Self-pay

## 2012-09-14 ENCOUNTER — Encounter: Payer: Self-pay | Admitting: Gastroenterology

## 2012-10-06 ENCOUNTER — Encounter (HOSPITAL_COMMUNITY): Payer: Self-pay | Admitting: Emergency Medicine

## 2012-10-06 ENCOUNTER — Emergency Department (HOSPITAL_COMMUNITY)
Admission: EM | Admit: 2012-10-06 | Discharge: 2012-10-06 | Disposition: A | Discharge status: Home / Self Care | Payer: 59 | Attending: Emergency Medicine | Admitting: Emergency Medicine

## 2012-10-06 DIAGNOSIS — N12 Tubulo-interstitial nephritis, not specified as acute or chronic: Secondary | ICD-10-CM

## 2012-10-06 DIAGNOSIS — R51 Headache: Secondary | ICD-10-CM | POA: Insufficient documentation

## 2012-10-06 DIAGNOSIS — Z8719 Personal history of other diseases of the digestive system: Secondary | ICD-10-CM | POA: Insufficient documentation

## 2012-10-06 DIAGNOSIS — R259 Unspecified abnormal involuntary movements: Secondary | ICD-10-CM | POA: Insufficient documentation

## 2012-10-06 DIAGNOSIS — F909 Attention-deficit hyperactivity disorder, unspecified type: Secondary | ICD-10-CM | POA: Insufficient documentation

## 2012-10-06 DIAGNOSIS — Z7982 Long term (current) use of aspirin: Secondary | ICD-10-CM | POA: Insufficient documentation

## 2012-10-06 DIAGNOSIS — Z87891 Personal history of nicotine dependence: Secondary | ICD-10-CM | POA: Insufficient documentation

## 2012-10-06 DIAGNOSIS — N10 Acute tubulo-interstitial nephritis: Secondary | ICD-10-CM | POA: Insufficient documentation

## 2012-10-06 DIAGNOSIS — F3289 Other specified depressive episodes: Secondary | ICD-10-CM | POA: Insufficient documentation

## 2012-10-06 DIAGNOSIS — K589 Irritable bowel syndrome without diarrhea: Secondary | ICD-10-CM | POA: Insufficient documentation

## 2012-10-06 DIAGNOSIS — Z87440 Personal history of urinary (tract) infections: Secondary | ICD-10-CM | POA: Insufficient documentation

## 2012-10-06 DIAGNOSIS — F329 Major depressive disorder, single episode, unspecified: Secondary | ICD-10-CM | POA: Insufficient documentation

## 2012-10-06 DIAGNOSIS — I10 Essential (primary) hypertension: Secondary | ICD-10-CM | POA: Insufficient documentation

## 2012-10-06 DIAGNOSIS — R110 Nausea: Secondary | ICD-10-CM | POA: Insufficient documentation

## 2012-10-06 DIAGNOSIS — E039 Hypothyroidism, unspecified: Secondary | ICD-10-CM | POA: Insufficient documentation

## 2012-10-06 DIAGNOSIS — F429 Obsessive-compulsive disorder, unspecified: Secondary | ICD-10-CM | POA: Insufficient documentation

## 2012-10-06 DIAGNOSIS — Z79899 Other long term (current) drug therapy: Secondary | ICD-10-CM | POA: Insufficient documentation

## 2012-10-06 DIAGNOSIS — IMO0001 Reserved for inherently not codable concepts without codable children: Secondary | ICD-10-CM | POA: Insufficient documentation

## 2012-10-06 LAB — URINALYSIS, ROUTINE W REFLEX MICROSCOPIC
Glucose, UA: NEGATIVE mg/dL
Specific Gravity, Urine: 1.016 (ref 1.005–1.030)
Urobilinogen, UA: 0.2 mg/dL (ref 0.0–1.0)

## 2012-10-06 LAB — URINE MICROSCOPIC-ADD ON

## 2012-10-06 LAB — CBC WITH DIFFERENTIAL/PLATELET
Basophils Absolute: 0 10*3/uL (ref 0.0–0.1)
Basophils Relative: 0 % (ref 0–1)
Eosinophils Relative: 1 % (ref 0–5)
HCT: 36 % (ref 36.0–46.0)
MCH: 29.5 pg (ref 26.0–34.0)
MCHC: 33.9 g/dL (ref 30.0–36.0)
MCV: 87.2 fL (ref 78.0–100.0)
Monocytes Absolute: 0.5 10*3/uL (ref 0.1–1.0)
RDW: 12.7 % (ref 11.5–15.5)

## 2012-10-06 LAB — COMPREHENSIVE METABOLIC PANEL
AST: 25 U/L (ref 0–37)
Albumin: 3 g/dL — ABNORMAL LOW (ref 3.5–5.2)
Calcium: 9 mg/dL (ref 8.4–10.5)
Creatinine, Ser: 0.89 mg/dL (ref 0.50–1.10)
GFR calc non Af Amer: 69 mL/min — ABNORMAL LOW (ref >90)

## 2012-10-06 MED ORDER — CIPROFLOXACIN HCL 500 MG PO TABS: 500.0000 mg | ORAL_TABLET | Freq: Two times a day (BID) | ORAL | Status: DC

## 2012-10-06 MED ORDER — CIPROFLOXACIN HCL 500 MG PO TABS
500.0000 mg | ORAL_TABLET | Freq: Once | ORAL | Status: AC
  Administered 2012-10-06: 500 mg via ORAL
  Filled 2012-10-06: qty 1

## 2012-10-06 NOTE — ED Notes (Signed)
Pt coming from [facility] [place] office, sent for fever, chills. Rule out urosepsis

## 2012-10-06 NOTE — Discharge Instructions (Signed)

## 2012-10-06 NOTE — ED Notes (Signed)
A drink was given. Nurse was notified.

## 2012-10-06 NOTE — ED Provider Notes (Signed)
History     CSN: [id number]  Arrival date & time 10/06/12  1443   First MD Initiated Contact with Patient 10/06/12 1526      Chief Complaint  Patient presents with  . Fever    (Consider location/radiation/quality/duration/timing/severity/associated sxs/prior treatment) HPI Comments: Patient presents to the ED with a febrile illness. She states that 2 days ago she started having fever and shaking chills. She's had a MAXIMUM TEMPERATURE of 103. She states she's had diffuse myalgias. She's had some intermittent bifrontal headaches. She denies any neck stiffness. She has had a history of recurrent UTIs. She's currently not having any UTI symptoms but has had UTIs in the past without symptoms. She denies any current diarrhea. She has some mild nausea but no vomiting. She denies any congestion sore throat or coughing. She denies any abdominal pain. She was seen by her primary care provider who found her blood pressure to be 80/50 and his her heart rate to be 103. She was sent over here for further evaluation. She denies any rashes or recent tick bites  Patient is a 62 y.o. female presenting with fever.  Fever Associated symptoms: chills, headaches, myalgias and nausea   Associated symptoms: no chest pain, no congestion, no cough, no diarrhea, no rash, no rhinorrhea and no vomiting     Past Medical History  Diagnosis Date  . Hypothyroid   . Hypertension   . ADD (attention deficit disorder with hyperactivity)   . OCD (obsessive compulsive disorder)   . Depression   . IBS (irritable bowel syndrome)   . Diverticulosis     Past Surgical History  Procedure Laterality Date  . Tonsillectomy    . Oophorectomy    . Bunionectomy    . Colon surgery      colonoscopy    No family history on file.  History  Substance Use Topics  . Smoking status: Former Smoker -- 1.00 packs/day for 40 years    Types: Cigarettes    Quit date: 04/27/2002  . Smokeless tobacco: Never Used  . Alcohol Use: No     OB History   Grav Para Term Preterm Abortions TAB SAB Ect Mult Living                  Review of Systems  Constitutional: Positive for fever, chills and fatigue. Negative for diaphoresis.  HENT: Negative for congestion, rhinorrhea and sneezing.   Eyes: Negative.   Respiratory: Negative for cough, chest tightness and shortness of breath.   Cardiovascular: Negative for chest pain and leg swelling.  Gastrointestinal: Positive for nausea. Negative for vomiting, abdominal pain, diarrhea and blood in stool.  Genitourinary: Negative for frequency, hematuria, flank pain and difficulty urinating.  Musculoskeletal: Positive for myalgias. Negative for back pain and arthralgias.  Skin: Negative for rash.  Neurological: Positive for headaches. Negative for dizziness, speech difficulty, weakness and numbness.    Allergies  Cephalexin; Codeine; and Sulfa antibiotics  Home Medications   Current Outpatient Rx  Name  Route  Sig  Dispense  Refill  . aspirin 81 MG chewable tablet   Oral   Chew 81 mg by mouth daily.           Marland Kitchen aspirin-acetaminophen-caffeine (EXCEDRIN MIGRAINE) 250-250-65 MG per tablet   Oral   Take 2 tablets by mouth every 6 (six) hours as needed. Pain or headache         . atenolol-chlorthalidone (TENORETIC) 100-25 MG per tablet   Oral   Take 1 tablet by  mouth daily.           Marland Kitchen buPROPion (WELLBUTRIN SR) 150 MG 12 hr tablet   Oral   Take 150 mg by mouth daily.          . calcium carbonate (OS-CAL) 600 MG TABS   Oral   Take 600 mg by mouth daily.         . Cholecalciferol (VITAMIN D-3) 5000 UNITS TABS   Oral   Take 1 tablet by mouth daily.         . hyoscyamine (LEVBID) 0.375 MG 12 hr tablet   Oral   Take 0.375 mg by mouth every 12 (twelve) hours as needed. Stomach pain         . hyoscyamine (LEVSIN SL) 0.125 MG SL tablet   Sublingual   Place 0.125 mg under the tongue every 4 (four) hours as needed. Stomach pain         . levothyroxine  (SYNTHROID, LEVOTHROID) 100 MCG tablet   Oral   Take 100 mcg by mouth daily.           . magnesium gluconate (MAGONATE) 500 MG tablet   Oral   Take 500 mg by mouth daily.         . methylphenidate (RITALIN) 20 MG tablet   Oral   Take 20 mg by mouth daily.          . Multiple Vitamin (MULTIVITAMIN WITH MINERALS) TABS   Oral   Take 1 tablet by mouth daily.         . Omega-3 Fatty Acids (FISH OIL) 1000 MG CAPS   Oral   Take 1 capsule by mouth daily.         . pantoprazole (PROTONIX) 40 MG tablet   Oral   Take 1 tablet (40 mg total) by mouth daily.   14 tablet   1   . ciprofloxacin (CIPRO) 500 MG tablet   Oral   Take 1 tablet (500 mg total) by mouth 2 (two) times daily. One po bid x 10 days   20 tablet   0     BP 108/58  Pulse 84  Temp(Src) 100.3 F (37.9 C) (Oral)  Resp 20  SpO2 94%  Physical Exam  Constitutional: She is oriented to person, place, and time. She appears well-developed and well-nourished.  HENT:  Head: Normocephalic and atraumatic.  Mouth/Throat: Oropharynx is clear and moist.  Eyes: Pupils are equal, round, and reactive to light.  Neck: Normal range of motion. Neck supple.  Cardiovascular: Normal rate, regular rhythm and normal heart sounds.   Pulmonary/Chest: Effort normal and breath sounds normal. No respiratory distress. She has no wheezes. She has no rales. She exhibits no tenderness.  Abdominal: Soft. Bowel sounds are normal. There is no tenderness. There is no rebound and no guarding.  No CVA tenderness  Musculoskeletal: Normal range of motion. She exhibits no edema.  Lymphadenopathy:    She has no cervical adenopathy.  Neurological: She is alert and oriented to person, place, and time.  Skin: Skin is warm and dry. No rash noted.  Psychiatric: She has a normal mood and affect.    ED Course  Procedures (including critical care time)  Results for orders placed during the hospital encounter of 10/06/12  CBC WITH DIFFERENTIAL       Result Value Range   WBC 6.3  4.0 - 10.5 K/uL   RBC 4.13  3.87 - 5.11 MIL/uL   Hemoglobin 12.2  12.0 -  15.0 g/dL   HCT 40.9  81.1 - 91.4 %   MCV 87.2  78.0 - 100.0 fL   MCH 29.5  26.0 - 34.0 pg   MCHC 33.9  30.0 - 36.0 g/dL   RDW 78.2  95.6 - 21.3 %   Platelets 190  150 - 400 K/uL   Neutrophils Relative % 68  43 - 77 %   Neutro Abs 4.3  1.7 - 7.7 K/uL   Lymphocytes Relative 23  12 - 46 %   Lymphs Abs 1.4  0.7 - 4.0 K/uL   Monocytes Relative 9  3 - 12 %   Monocytes Absolute 0.5  0.1 - 1.0 K/uL   Eosinophils Relative 1  0 - 5 %   Eosinophils Absolute 0.1  0.0 - 0.7 K/uL   Basophils Relative 0  0 - 1 %   Basophils Absolute 0.0  0.0 - 0.1 K/uL  COMPREHENSIVE METABOLIC PANEL      Result Value Range   Sodium 134 (*) 135 - 145 mEq/L   Potassium 3.6  3.5 - 5.1 mEq/L   Chloride 101  96 - 112 mEq/L   CO2 23  19 - 32 mEq/L   Glucose, Bld 122 (*) 70 - 99 mg/dL   BUN 18  6 - 23 mg/dL   Creatinine, Ser 0.86  0.50 - 1.10 mg/dL   Calcium 9.0  8.4 - 57.8 mg/dL   Total Protein 6.8  6.0 - 8.3 g/dL   Albumin 3.0 (*) 3.5 - 5.2 g/dL   AST 25  0 - 37 U/L   ALT 20  0 - 35 U/L   Alkaline Phosphatase 79  39 - 117 U/L   Total Bilirubin 0.6  0.3 - 1.2 mg/dL   GFR calc non Af Amer 69 (*) >90 mL/min   GFR calc Af Amer 79 (*) >90 mL/min  URINALYSIS, ROUTINE W REFLEX MICROSCOPIC      Result Value Range   Color, Urine YELLOW  YELLOW   APPearance CLOUDY (*) CLEAR   Specific Gravity, Urine 1.016  1.005 - 1.030   pH 5.5  5.0 - 8.0   Glucose, UA NEGATIVE  NEGATIVE mg/dL   Hgb urine dipstick TRACE (*) NEGATIVE   Bilirubin Urine NEGATIVE  NEGATIVE   Ketones, ur NEGATIVE  NEGATIVE mg/dL   Protein, ur 30 (*) NEGATIVE mg/dL   Urobilinogen, UA 0.2  0.0 - 1.0 mg/dL   Nitrite POSITIVE (*) NEGATIVE   Leukocytes, UA MODERATE (*) NEGATIVE  URINE MICROSCOPIC-ADD ON      Result Value Range   Squamous Epithelial / LPF FEW (*) RARE   WBC, UA 7-10  <3 WBC/hpf   RBC / HPF 0-2  <3 RBC/hpf   Bacteria, UA MANY  (*) RARE   Urine-Other MUCOUS PRESENT    CG4 I-STAT (LACTIC ACID)      Result Value Range   Lactic Acid, Venous 0.84  0.5 - 2.2 mmol/L   No results found.   1. Pyelonephritis       MDM  Patient with a urinary tract infection. Given the fever chills and myalgias associated with the back pain it's likely pyelonephritis. She is well-appearing with no vomiting. Her lactic acid is not suggestive of sepsis and her blood pressures been normal since she's been in ED. She was given IV fluids and a dose of Cipro here in the ED. His urine was sent for culture. She was advised to followup closely with her primary care physician return here  if she has worsening symptoms.        Rolan Bucco, MD 10/06/12 1726

## 2012-10-06 NOTE — ED Notes (Signed)
Pt sent her by PCP who wanted patient evaluated for urosepsis. Pt was at office today and had a low blood pressure, BP 88/50 and pulse of 103. Pt reports fevers as high as 103 F.

## 2012-10-08 LAB — URINE CULTURE: Special Requests: NORMAL

## 2012-10-09 ENCOUNTER — Telehealth (HOSPITAL_COMMUNITY): Payer: Self-pay | Admitting: Emergency Medicine

## 2012-10-09 NOTE — ED Notes (Signed)
Post ED Visit - Positive Culture Follow-up  Culture report reviewed by antimicrobial stewardship pharmacist: []  Wes Dulaney, Pharm.D., BCPS []  Celedonio Miyamoto, Pharm.D., BCPS []  Georgina Pillion, [address].D., BCPS []  [place], [address].D., BCPS, AAHIVP []  Estella Husk, Pharm.D., BCPS, AAHIVP [x]  Abran Duke, [address].D., BCPS  Positive urine culture Treated with Cipro, organism sensitive to the same and no further patient follow-up is required at this time.  Kylie A Holland 10/09/2012, 10:31 AM

## 2012-10-12 LAB — CULTURE, BLOOD (ROUTINE X 2): Culture: NO GROWTH

## 2012-11-01 ENCOUNTER — Ambulatory Visit (AMBULATORY_SURGERY_CENTER): Payer: 59

## 2012-11-01 VITALS — Ht 64.5 in | Wt 177.2 lb

## 2012-11-01 DIAGNOSIS — R194 Change in bowel habit: Secondary | ICD-10-CM

## 2012-11-01 DIAGNOSIS — Z8371 Family history of colonic polyps: Secondary | ICD-10-CM

## 2012-11-01 DIAGNOSIS — R198 Other specified symptoms and signs involving the digestive system and abdomen: Secondary | ICD-10-CM

## 2012-11-01 MED ORDER — MOVIPREP 100 G PO SOLR: ORAL | Status: DC

## 2012-11-16 ENCOUNTER — Encounter: Payer: Self-pay | Admitting: Gastroenterology

## 2012-11-16 ENCOUNTER — Ambulatory Visit (AMBULATORY_SURGERY_CENTER): Payer: 59 | Admitting: Gastroenterology

## 2012-11-16 VITALS — BP 149/96 | HR 59 | Temp 93.5°F | Resp 34 | Ht 64.5 in | Wt 177.0 lb

## 2012-11-16 DIAGNOSIS — D126 Benign neoplasm of colon, unspecified: Secondary | ICD-10-CM

## 2012-11-16 DIAGNOSIS — R198 Other specified symptoms and signs involving the digestive system and abdomen: Secondary | ICD-10-CM

## 2012-11-16 DIAGNOSIS — Z83719 Family history of colon polyps, unspecified: Secondary | ICD-10-CM

## 2012-11-16 DIAGNOSIS — Z8371 Family history of colonic polyps: Secondary | ICD-10-CM

## 2012-11-16 MED ORDER — SODIUM CHLORIDE 0.9 % IV SOLN: 500.0000 mL | INTRAVENOUS | Status: DC

## 2012-11-16 NOTE — Progress Notes (Signed)
Called to room to assist during endoscopic procedure.  Patient ID and intended procedure confirmed with present staff. Received instructions for my participation in the procedure from the performing physician.  

## 2012-11-16 NOTE — Progress Notes (Signed)
Patient did not experience any of the following events: a burn prior to discharge; a fall within the facility; wrong site/side/patient/procedure/implant event; or a hospital transfer or hospital admission upon discharge from the facility. (G8907) Patient did not have preoperative order for IV antibiotic SSI prophylaxis. (G8918)  

## 2012-11-16 NOTE — Progress Notes (Signed)
Procedure ends, to recovery, report given and VSS. 

## 2012-11-16 NOTE — Op Note (Addendum)
[facility] [address] [place], [address]   COLONOSCOPY PROCEDURE REPORT PATIENT: [name], [name]  MR#: [id number] BIRTHDATE: [birth year] , 62  yrs. old GENDER: Female ENDOSCOPIST: [name] Dare, MD, [facility] PROCEDURE DATE:  11/16/2012 PROCEDURE:   Colonoscopy with biopsy and snare polypectomy First Screening Colonoscopy - Avg.  risk and is 50 yrs.  old or older - No.  Prior Negative Screening - Now for repeat screening. Above average risk  History of Adenoma - Now for follow-up colonoscopy & has been > or = to 3 yrs.  N/A  Polyps Removed Today? Yes. ASA CLASS:   Class II INDICATIONS:Patient's family history of colon polyps. MEDICATIONS: MAC sedation, administered by CRNA and propofol (Diprivan) 330mg  IV DESCRIPTION OF PROCEDURE:   After the risks benefits and alternatives of the procedure were thoroughly explained, informed consent was obtained.  A digital rectal exam revealed no abnormalities of the rectum.   The LB [id number]  endoscope was introduced through the anus and advanced to the cecum, which was identified by both the appendix and ileocecal valve. No adverse events experienced with a tortuous and redundant colon.   The quality of the prep was adequate, using MoviPrep.  The instrument was then slowly withdrawn as the colon was fully examined.  COLON FINDINGS: A sessile polyp measuring 7 mm in size was found in the sigmoid colon. A polypectomy was performed using snare cautery. The resection was complete and the polyp tissue was completely retrieved.  A sessile polyp measuring 3 mm in size was found in the sigmoid colon. A polypectomy was performed with cold forceps. The resection was complete and the polyp tissue was completely retrieved. Mild diverticulosis was noted in the sigmoid colon. The colon was otherwise normal. There was no diverticulosis, inflammation, polyps or cancers unless previously stated. Retroflexed views revealed no  abnormalities. The time to cecum=3 minutes 32 seconds.  Withdrawal time=12 minutes 00 seconds.  The scope was withdrawn and the procedure completed. COMPLICATIONS: There were no complications. ENDOSCOPIC IMPRESSION: 1.   Sessile polyp measuring 7 mm in the sigmoid colon; polypectomy performed using snare cautery 2.   Sessile polyp measuring 3 mmin the sigmoid colon; polypectomy performed with cold forceps 3.   Mild diverticulosis was noted in the sigmoid colon  RECOMMENDATIONS: 1.  Hold aspirin, aspirin products, and anti-inflammatory medication for 2 weeks. 2.  Await pathology results 3.  High fiber diet with liberal fluid intake. 4.  Repeat Colonoscopy in 5 years.  eSigned:  [name] Dare, MD, [facility] 11/16/2012 11:00 AM Revised: 11/16/2012 11:00 AM  cc: Lucky Cowboy, MD

## 2012-11-16 NOTE — Patient Instructions (Addendum)
YOU HAD AN ENDOSCOPIC PROCEDURE TODAY AT THE [facility]: Refer to the procedure report that was given to you for any specific questions about what was found during the examination.  If the procedure report does not answer your questions, please call your gastroenterologist to clarify.  If you requested that your care partner not be given the details of your procedure findings, then the procedure report has been included in a sealed envelope for you to review at your convenience later.  YOU SHOULD EXPECT: Some feelings of bloating in the abdomen. Passage of more gas than usual.  Walking can help get rid of the air that was put into your GI tract during the procedure and reduce the bloating. If you had a lower endoscopy (such as a colonoscopy or flexible sigmoidoscopy) you may notice spotting of blood in your stool or on the toilet paper. If you underwent a bowel prep for your procedure, then you may not have a normal bowel movement for a few days.  DIET: Your first meal following the procedure should be a light meal and then it is ok to progress to your normal diet.  A half-sandwich or bowl of soup is an example of a good first meal.  Heavy or fried foods are harder to digest and may make you feel nauseous or bloated.  Likewise meals heavy in dairy and vegetables can cause extra gas to form and this can also increase the bloating.  Drink plenty of fluids but you should avoid alcoholic beverages for 24 hours.  ACTIVITY: Your care partner should take you home directly after the procedure.  You should plan to take it easy, moving slowly for the rest of the day.  You can resume normal activity the day after the procedure however you should NOT DRIVE or use heavy machinery for 24 hours (because of the sedation medicines used during the test).    SYMPTOMS TO REPORT IMMEDIATELY: A gastroenterologist can be reached at any hour.  During normal business hours, 8:30 AM to 5:00 PM Monday through Friday,  call [phone].  After hours and on weekends, please call the GI answering service at [phone] who will take a message and have the physician on call contact you.   Following lower endoscopy (colonoscopy or flexible sigmoidoscopy):  Excessive amounts of blood in the stool  Significant tenderness or worsening of abdominal pains  Swelling of the abdomen that is new, acute  Fever of 100F or higher    FOLLOW UP: If any biopsies were taken you will be contacted by phone or by letter within the next 1-3 weeks.  Call your gastroenterologist if you have not heard about the biopsies in 3 weeks.  Our staff will call the home number listed on your records the next business day following your procedure to check on you and address any questions or concerns that you may have at that time regarding the information given to you following your procedure. This is a courtesy call and so if there is no answer at the home number and we have not heard from you through the emergency physician on call, we will assume that you have returned to your regular daily activities without incident.  SIGNATURES/CONFIDENTIALITY: You and/or your care partner have signed paperwork which will be entered into your electronic medical record.  These signatures attest to the fact that that the information above on your After Visit Summary has been reviewed and is understood.  Full responsibility of the confidentiality   of this discharge information lies with you and/or your care-partner.        INFORMATION ON POLYPS ,DIVERTICULOSIS,&HIGH FIBER DIET GIVEN TO YOU TODAY   HOLD ASPIRIN FOR TWO WEEKS

## 2012-11-18 ENCOUNTER — Telehealth: Payer: Self-pay | Admitting: *Deleted

## 2012-11-18 NOTE — Telephone Encounter (Signed)
Left message

## 2012-11-23 ENCOUNTER — Encounter: Payer: Self-pay | Admitting: Gastroenterology

## 2013-03-02 ENCOUNTER — Other Ambulatory Visit: Payer: Self-pay

## 2013-04-04 ENCOUNTER — Ambulatory Visit (INDEPENDENT_AMBULATORY_CARE_PROVIDER_SITE_OTHER): Payer: 59 | Admitting: Emergency Medicine

## 2013-04-04 ENCOUNTER — Encounter: Payer: Self-pay | Admitting: Emergency Medicine

## 2013-04-04 VITALS — BP 122/80

## 2013-04-04 DIAGNOSIS — I10 Essential (primary) hypertension: Secondary | ICD-10-CM

## 2013-04-04 DIAGNOSIS — E039 Hypothyroidism, unspecified: Secondary | ICD-10-CM

## 2013-04-04 DIAGNOSIS — R0602 Shortness of breath: Secondary | ICD-10-CM

## 2013-04-04 DIAGNOSIS — J441 Chronic obstructive pulmonary disease with (acute) exacerbation: Secondary | ICD-10-CM

## 2013-04-04 DIAGNOSIS — J438 Other emphysema: Secondary | ICD-10-CM

## 2013-04-04 LAB — CBC WITH DIFFERENTIAL/PLATELET
Basophils Absolute: 0.1 10*3/uL (ref 0.0–0.1)
Eosinophils Absolute: 0.4 10*3/uL (ref 0.0–0.7)
Eosinophils Relative: 8 % — ABNORMAL HIGH (ref 0–5)
MCH: 30.8 pg (ref 26.0–34.0)
MCHC: 34 g/dL (ref 30.0–36.0)
MCV: 90.5 fL (ref 78.0–100.0)
Neutrophils Relative %: 36 % — ABNORMAL LOW (ref 43–77)
Platelets: 296 10*3/uL (ref 150–400)
RDW: 14.1 % (ref 11.5–15.5)
WBC: 5.5 10*3/uL (ref 4.0–10.5)

## 2013-04-04 LAB — BASIC METABOLIC PANEL WITH GFR
CO2: 30 mEq/L (ref 19–32)
Calcium: 9.3 mg/dL (ref 8.4–10.5)
Creat: 0.92 mg/dL (ref 0.50–1.10)
GFR, Est African American: 78 mL/min
Glucose, Bld: 87 mg/dL (ref 70–99)
Sodium: 139 mEq/L (ref 135–145)

## 2013-04-04 MED ORDER — IPRATROPIUM-ALBUTEROL 0.5-2.5 (3) MG/3ML IN SOLN: 3.0000 mL | RESPIRATORY_TRACT | Status: DC | PRN

## 2013-04-04 NOTE — Patient Instructions (Signed)
Chronic Obstructive Pulmonary Disease Exacerbation ° Chronic obstructive pulmonary disease (COPD) is a lung disease. The lungs become damaged, making it hard to get air in and out of your lungs. COPD exacerbation means that your COPD has gotten worse. If you do not get help, this can be a life-threatening problem. °HOME CARE °· Do not smoke. °· Avoid tobacco smoke and other things that bother your lungs. °· If given, take your antibiotic medicine as told. Finish the medicine even if you start to feel better. °· Only take medicines as told by your doctor. °· Drink enough fluids to keep your pee (urine) clear or pale yellow. °· Use a cool mist machine (vaporizer). °· If you use oxygen or a machine that turns liquid medicine into a mist (nebulizer), continue to use them as told. °· Keep up with shots (vaccinations) as told by your doctor. °· Exercise regularly. °· Eat healthy foods. °· Keep all doctor visits as told. °GET HELP RIGHT AWAY IF: °· You are very short of breath. °· You have trouble talking. °· You have bad chest pain. °· You have blood in your spit (sputum). °· You have a fever, or you keep throwing up (vomiting). °· You feel weak, or you pass out (faint). °· You feel confused. °· You keep getting worse. °MAKE SURE YOU:  °· Understand these instructions. °· Will watch your condition. °· Will get help right away if you are not doing well or get worse. °Document Released: 04/02/2011 Document Revised: 07/06/2011 Document Reviewed: 04/02/2011 °[employer]® Patient Information ©2014 [employer]. ° °

## 2013-04-05 DIAGNOSIS — E039 Hypothyroidism, unspecified: Secondary | ICD-10-CM | POA: Insufficient documentation

## 2013-04-05 DIAGNOSIS — J441 Chronic obstructive pulmonary disease with (acute) exacerbation: Secondary | ICD-10-CM | POA: Insufficient documentation

## 2013-04-05 LAB — TSH: TSH: 0.442 u[IU]/mL (ref 0.350–4.500)

## 2013-04-05 NOTE — Progress Notes (Signed)
Subjective:    Patient ID: [name], female    DOB: 03-04-1951, 62 y.o.   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 She notes with colder weather she has a dry cough with occasional wheeze and mild SOB with exertion. She does not have current Albuterol MDI because she felt they did not work in the past. She wants to try Nebulizer instead. She notes occasional LE edema with prolonged standing, resolves with elevation of legs and activity. She had neg chest XRAY 03/27/12  She also needs HTn/ Hypothyroid recheck of labs. She notes otherwise feeling well. No unusual weight gain. BP good at home.   Current Outpatient Prescriptions on File Prior to Visit  Medication Sig Dispense Refill  . aspirin 81 MG chewable tablet Chew 81 mg by mouth daily.        Marland Kitchen aspirin-acetaminophen-caffeine (EXCEDRIN MIGRAINE) 250-250-65 MG per tablet Take 2 tablets by mouth every 6 (six) hours as needed. Pain or headache      . atenolol (TENORMIN) 100 MG tablet Take 100 mg by mouth daily.      Marland Kitchen buPROPion (WELLBUTRIN SR) 150 MG 12 hr tablet Take 150 mg by mouth daily.       . calcium carbonate (OS-CAL) 600 MG TABS Take 600 mg by mouth daily.      . calcium carbonate (TUMS - DOSED IN MG ELEMENTAL CALCIUM) 500 MG chewable tablet Chew 1 tablet by mouth daily.      . Cholecalciferol (VITAMIN D-3) 5000 UNITS TABS Take 1 tablet by mouth daily.      . hyoscyamine (LEVBID) 0.375 MG 12 hr tablet Take 0.375 mg by mouth every 12 (twelve) hours as needed. Stomach pain      . hyoscyamine (LEVSIN SL) 0.125 MG SL tablet Place 0.125 mg under the tongue every 4 (four) hours as needed. Stomach pain      . levothyroxine (SYNTHROID, LEVOTHROID) 100 MCG tablet Take 100 mcg by mouth daily. 1.5 Tue Thur only 1 rest of week      . magnesium gluconate (MAGONATE) 500 MG tablet Take 500 mg by mouth daily.      . methylphenidate (RITALIN) 20 MG tablet Take 20 mg by mouth as  needed.       . Multiple Vitamin (MULTIVITAMIN WITH MINERALS) TABS Take 1 tablet by mouth daily.      . Omega-3 Fatty Acids (FISH OIL) 1000 MG CAPS Take 1 capsule by mouth daily.      . pantoprazole (PROTONIX) 40 MG tablet Take 1 tablet (40 mg total) by mouth daily.  14 tablet  1  . acetaminophen (TYLENOL) 500 MG tablet Take 500 mg by mouth every 6 (six) hours as needed for pain.      Marland Kitchen ibuprofen (ADVIL,MOTRIN) 200 MG tablet Take 200 mg by mouth every 6 (six) hours as needed for pain.       No current facility-administered medications on file prior to visit.   ALLERGIES Cephalexin; Codeine; and Sulfa antibiotics  Past Medical History  Diagnosis Date  . Hypothyroid   . Hypertension   . ADD (attention deficit disorder with hyperactivity)   . OCD (obsessive compulsive disorder)   . Depression   . IBS (irritable bowel syndrome)   . Diverticulosis   . Irregular heart rate     left bundle branch block  . Helicobacter pylori (H. pylori)   . History of IBS  Review of Systems  Respiratory: Positive for cough, shortness of breath and wheezing.   All other systems reviewed and are negative.   BP 122/80     Objective:   Physical Exam  Nursing note and vitals reviewed. Constitutional: She is oriented to person, place, and time. She appears well-developed and well-nourished. No distress.  HENT:  Head: Normocephalic and atraumatic.  Right Ear: External ear normal.  Left Ear: External ear normal.  Nose: Nose normal.  Mouth/Throat: Oropharynx is clear and moist. No oropharyngeal exudate.  Eyes: Conjunctivae and EOM are normal.  Neck: Normal range of motion. Neck supple. No JVD present. No thyromegaly present.  Cardiovascular: Normal rate, regular rhythm, normal heart sounds and intact distal pulses.   Pulmonary/Chest: Effort normal. She has wheezes.  Abdominal: Soft. Bowel sounds are normal. She exhibits no distension and no mass. There is no tenderness. There is no rebound and no  guarding.  Musculoskeletal: Normal range of motion. She exhibits no edema and no tenderness.  Lymphadenopathy:    She has no cervical adenopathy.  Neurological: She is alert and oriented to person, place, and time. No cranial nerve deficit.  Skin: Skin is warm and dry. No rash noted. No erythema. No pallor.  Psychiatric: She has a normal mood and affect. Her behavior is normal. Judgment and thought content normal.          Assessment & Plan:  1. HTN, HYPOTHYROID RECHECK OF LABS 2.Cough with wheeze with SOB with exertion- Check labs, Patient prefers neb treatments at home, she hope those will be more efficient and can tolerate better than hand held puffer. Duoneb up to QID. Machine and RX given, use AD. May need Pred DP w/c with results. If no improvement will need pulm/ cardio evaluation pt w/c

## 2013-04-21 DIAGNOSIS — F909 Attention-deficit hyperactivity disorder, unspecified type: Secondary | ICD-10-CM | POA: Insufficient documentation

## 2013-04-21 DIAGNOSIS — E559 Vitamin D deficiency, unspecified: Secondary | ICD-10-CM | POA: Insufficient documentation

## 2013-04-21 DIAGNOSIS — F339 Major depressive disorder, recurrent, unspecified: Secondary | ICD-10-CM | POA: Insufficient documentation

## 2013-04-21 DIAGNOSIS — F429 Obsessive-compulsive disorder, unspecified: Secondary | ICD-10-CM | POA: Insufficient documentation

## 2013-04-21 DIAGNOSIS — F3342 Major depressive disorder, recurrent, in full remission: Secondary | ICD-10-CM | POA: Insufficient documentation

## 2013-04-21 DIAGNOSIS — G43909 Migraine, unspecified, not intractable, without status migrainosus: Secondary | ICD-10-CM | POA: Insufficient documentation

## 2013-04-21 DIAGNOSIS — F329 Major depressive disorder, single episode, unspecified: Secondary | ICD-10-CM | POA: Insufficient documentation

## 2013-04-21 DIAGNOSIS — R7309 Other abnormal glucose: Secondary | ICD-10-CM | POA: Insufficient documentation

## 2013-04-21 DIAGNOSIS — K5790 Diverticulosis of intestine, part unspecified, without perforation or abscess without bleeding: Secondary | ICD-10-CM | POA: Insufficient documentation

## 2013-04-25 ENCOUNTER — Ambulatory Visit (INDEPENDENT_AMBULATORY_CARE_PROVIDER_SITE_OTHER): Payer: 59 | Admitting: Internal Medicine

## 2013-04-25 ENCOUNTER — Encounter: Payer: Self-pay | Admitting: Internal Medicine

## 2013-04-25 VITALS — BP 130/74 | HR 76 | Temp 97.9°F | Resp 18 | Wt 181.2 lb

## 2013-04-25 DIAGNOSIS — K589 Irritable bowel syndrome without diarrhea: Secondary | ICD-10-CM

## 2013-04-25 DIAGNOSIS — E782 Mixed hyperlipidemia: Secondary | ICD-10-CM

## 2013-04-25 DIAGNOSIS — F988 Other specified behavioral and emotional disorders with onset usually occurring in childhood and adolescence: Secondary | ICD-10-CM

## 2013-04-25 DIAGNOSIS — J45909 Unspecified asthma, uncomplicated: Secondary | ICD-10-CM

## 2013-04-25 DIAGNOSIS — J0410 Acute tracheitis without obstruction: Secondary | ICD-10-CM

## 2013-04-25 DIAGNOSIS — Z79899 Other long term (current) drug therapy: Secondary | ICD-10-CM

## 2013-04-25 DIAGNOSIS — E559 Vitamin D deficiency, unspecified: Secondary | ICD-10-CM

## 2013-04-25 DIAGNOSIS — R110 Nausea: Secondary | ICD-10-CM

## 2013-04-25 DIAGNOSIS — I10 Essential (primary) hypertension: Secondary | ICD-10-CM

## 2013-04-25 DIAGNOSIS — R7309 Other abnormal glucose: Secondary | ICD-10-CM

## 2013-04-25 LAB — BASIC METABOLIC PANEL WITH GFR
BUN: 20 mg/dL (ref 6–23)
CO2: 29 mEq/L (ref 19–32)
Chloride: 103 mEq/L (ref 96–112)
Creat: 1.02 mg/dL (ref 0.50–1.10)
GFR, Est African American: 69 mL/min
Glucose, Bld: 82 mg/dL (ref 70–99)
Potassium: 4.5 mEq/L (ref 3.5–5.3)
Sodium: 139 mEq/L (ref 135–145)

## 2013-04-25 LAB — MAGNESIUM: Magnesium: 2 mg/dL (ref 1.5–2.5)

## 2013-04-25 LAB — LIPID PANEL
HDL: 57 mg/dL (ref >39)
LDL Cholesterol: 94 mg/dL (ref 0–99)
Total CHOL/HDL Ratio: 2.9 Ratio
Triglycerides: 84 mg/dL (ref <150)

## 2013-04-25 LAB — HEPATIC FUNCTION PANEL
ALT: 14 U/L (ref 0–35)
AST: 19 U/L (ref 0–37)
Albumin: 3.6 g/dL (ref 3.5–5.2)
Bilirubin, Direct: 0.1 mg/dL (ref 0.0–0.3)
Total Protein: 6.7 g/dL (ref 6.0–8.3)

## 2013-04-25 LAB — CBC WITH DIFFERENTIAL/PLATELET
Basophils Absolute: 0 10*3/uL (ref 0.0–0.1)
Basophils Relative: 1 % (ref 0–1)
Eosinophils Absolute: 0.3 10*3/uL (ref 0.0–0.7)
Eosinophils Relative: 8 % — ABNORMAL HIGH (ref 0–5)
HCT: 39.3 % (ref 36.0–46.0)
Hemoglobin: 13.6 g/dL (ref 12.0–15.0)
MCH: 31.2 pg (ref 26.0–34.0)
MCHC: 34.6 g/dL (ref 30.0–36.0)
MCV: 90.1 fL (ref 78.0–100.0)
Monocytes Absolute: 0.4 10*3/uL (ref 0.1–1.0)
Monocytes Relative: 10 % (ref 3–12)
Platelets: 243 10*3/uL (ref 150–400)

## 2013-04-25 LAB — HEMOGLOBIN A1C: Hgb A1c MFr Bld: 5.7 % — ABNORMAL HIGH (ref <5.7)

## 2013-04-25 MED ORDER — METHYLPHENIDATE HCL 20 MG PO TABS: ORAL_TABLET | ORAL | Status: DC

## 2013-04-25 MED ORDER — HYDROCODONE-ACETAMINOPHEN 5-325 MG PO TABS: ORAL_TABLET | ORAL | Status: DC

## 2013-04-25 MED ORDER — PREDNISONE 20 MG PO TABS: 20.0000 mg | ORAL_TABLET | ORAL | Status: DC

## 2013-04-25 MED ORDER — LISINOPRIL-HYDROCHLOROTHIAZIDE 20-12.5 MG PO TABS: 1.0000 | ORAL_TABLET | Freq: Every day | ORAL | Status: DC

## 2013-04-25 MED ORDER — AZITHROMYCIN 250 MG PO TABS: ORAL_TABLET | ORAL | Status: AC

## 2013-04-25 MED ORDER — HYOSCYAMINE SULFATE 0.125 MG SL SUBL: 0.1250 mg | SUBLINGUAL_TABLET | SUBLINGUAL | Status: DC | PRN

## 2013-04-25 MED ORDER — ONDANSETRON HCL 8 MG PO TABS: 8.0000 mg | ORAL_TABLET | Freq: Three times a day (TID) | ORAL | Status: DC | PRN

## 2013-04-25 NOTE — Patient Instructions (Addendum)
Cut Atenolol 100 mg in 1/2 = 50 mg daily  Start Lisinopril/hct 20/12.5   1 daily  - monitor BP's    Continue diet & medications same as discussed.   Further disposition pending lab results.    Asthma, Adult Asthma is a disease of the lungs and can make it hard to breathe. Asthma cannot be cured, but medicine can help control it. Asthma may be started (triggered) by:  Pollen.  Dust.  Animal skin flakes (dander).  Molds.  Foods.  Respiratory infections (colds, flu).  Smoke.  Exercise.  Stress.  Other things that cause allergic reactions or allergies (allergens). HOME CARE   Talk to your doctor about how to manage your attacks at home. This may include:  Using a tool called a peak flow meter.  Having medicine ready to stop the attack.  Take all medicine as told by your doctor.  Wash bed sheets and blankets every week in hot water and put them in the dryer.  Drink enough fluids to keep your pee (urine) clear or pale yellow.  Always be ready to get emergency help. Write down the phone number for your doctor. Keep it where you can easily find it.  Talk about exercise routines with your doctor.  If animal dander is causing your asthma, you may need to find a new home for your pet(s). GET HELP RIGHT AWAY IF:   You have muscle aches.  You cough more.  You have chest pain.  You have thick spit (sputum) that changes to yellow, green, [place], or bloody.  Medicine does not stop your wheezing.  You have problems breathing.  You have a fever.  Your medicine causes:  A rash.  Itching.  Puffiness (swelling).  Breathing problems. MAKE SURE YOU:   Understand these instructions.  Will watch your condition.  Will get help right away if you are not doing well or get worse. Document Released: 09/30/2007 Document Revised: 08/08/2012 Document Reviewed: 02/22/2008 [facility] Patient Information 2014 [place], [place].   Hypertension As your heart beats, it  forces blood through your arteries. This force is your blood pressure. If the pressure is too high, it is called hypertension (HTN) or high blood pressure. HTN is dangerous because you may have it and not know it. High blood pressure may mean that your heart has to work harder to pump blood. Your arteries may be narrow or stiff. The extra work puts you at risk for heart disease, stroke, and other problems.  Blood pressure consists of two numbers, a higher number over a lower, 110/72, for example. It is stated as "110 over 72." The ideal is below 120 for the top number (systolic) and under 80 for the bottom (diastolic). Write down your blood pressure today. You should pay close attention to your blood pressure if you have certain conditions such as:  Heart failure.  Prior heart attack.  Diabetes  Chronic kidney disease.  Prior stroke.  Multiple risk factors for heart disease. To see if you have HTN, your blood pressure should be measured while you are seated with your arm held at the level of the heart. It should be measured at least twice. A one-time elevated blood pressure reading (especially in the Emergency Department) does not mean that you need treatment. There may be conditions in which the blood pressure is different between your right and left arms. It is important to see your caregiver soon for a recheck. Most people have essential hypertension which means that there is  not a specific cause. This type of high blood pressure may be lowered by changing lifestyle factors such as:  Stress.  Smoking.  Lack of exercise.  Excessive weight.  Drug/tobacco/alcohol use.  Eating less salt. Most people do not have symptoms from high blood pressure until it has caused damage to the body. Effective treatment can often prevent, delay or reduce that damage. TREATMENT  When a cause has been identified, treatment for high blood pressure is directed at the cause. There are a large number of  medications to treat HTN. These fall into several categories, and your caregiver will help you select the medicines that are best for you. Medications may have side effects. You should review side effects with your caregiver. If your blood pressure stays high after you have made lifestyle changes or started on medicines,   Your medication(s) may need to be changed.  Other problems may need to be addressed.  Be certain you understand your prescriptions, and know how and when to take your medicine.  Be sure to follow up with your caregiver within the time frame advised (usually within two weeks) to have your blood pressure rechecked and to review your medications.  If you are taking more than one medicine to lower your blood pressure, make sure you know how and at what times they should be taken. Taking two medicines at the same time can result in blood pressure that is too low. SEEK IMMEDIATE MEDICAL CARE IF:  You develop a severe headache, blurred or changing vision, or confusion.  You have unusual weakness or numbness, or a faint feeling.  You have severe chest or abdominal pain, vomiting, or breathing problems. MAKE SURE YOU:   Understand these instructions.  Will watch your condition.  Will get help right away if you are not doing well or get worse. Document Released: 04/13/2005 Document Revised: 07/06/2011 Document Reviewed: 12/02/2007 [facility] Patient Information 2014 [place], [place].  Diabetes and Exercise Exercising regularly is important. It is not just about losing weight. It has many health benefits, such as:  Improving your overall fitness, flexibility, and endurance.  Increasing your bone density.  Helping with weight control.  Decreasing your body fat.  Increasing your muscle strength.  Reducing stress and tension.  Improving your overall health. People with diabetes who exercise gain additional benefits because exercise:  Reduces appetite.  Improves the  body's use of blood sugar (glucose).  Helps lower or control blood glucose.  Decreases blood pressure.  Helps control blood lipids (such as cholesterol and triglycerides).  Improves the body's use of the hormone insulin by:  Increasing the body's insulin sensitivity.  Reducing the body's insulin needs.  Decreases the risk for heart disease because exercising:  Lowers cholesterol and triglycerides levels.  Increases the levels of good cholesterol (such as high-density lipoproteins [HDL]) in the body.  Lowers blood glucose levels. YOUR ACTIVITY PLAN  Choose an activity that you enjoy and set realistic goals. Your health care provider or diabetes educator can help you make an activity plan that works for you. You can break activities into 2 or 3 sessions throughout the day. Doing so is as good as one long session. Exercise ideas include:  Taking the dog for a walk.  Taking the stairs instead of the elevator.  Dancing to your favorite song.  Doing your favorite exercise with a friend. RECOMMENDATIONS FOR EXERCISING WITH TYPE 1 OR TYPE 2 DIABETES   Check your blood glucose before exercising. If blood glucose levels are greater  than 240 mg/dL, check for urine ketones. Do not exercise if ketones are present.  Avoid injecting insulin into areas of the body that are going to be exercised. For example, avoid injecting insulin into:  The arms when playing tennis.  The legs when jogging.  Keep a record of:  Food intake before and after you exercise.  Expected peak times of insulin action.  Blood glucose levels before and after you exercise.  The type and amount of exercise you have done.  Review your records with your health care provider. Your health care provider will help you to develop guidelines for adjusting food intake and insulin amounts before and after exercising.  If you take insulin or oral hypoglycemic agents, watch for signs and symptoms of hypoglycemia. They  include:  Dizziness.  Shaking.  Sweating.  Chills.  Confusion.  Drink plenty of water while you exercise to prevent dehydration or heat stroke. Body water is lost during exercise and must be replaced.  Talk to your health care provider before starting an exercise program to make sure it is safe for you. Remember, almost any type of activity is better than none. Document Released: 07/04/2003 Document Revised: 12/14/2012 Document Reviewed: 09/20/2012 [facility] Patient Information 2014 [place], [place].  Cholesterol Cholesterol is a white, waxy, fat-like protein needed by your body in small amounts. The liver makes all the cholesterol you need. It is carried from the liver by the blood through the blood vessels. Deposits (plaque) may build up on blood vessel walls. This makes the arteries narrower and stiffer. Plaque increases the risk for heart attack and stroke. You cannot feel your cholesterol level even if it is very high. The only way to know is by a blood test to check your lipid (fats) levels. Once you know your cholesterol levels, you should keep a record of the test results. Work with your caregiver to to keep your levels in the desired range. WHAT THE RESULTS MEAN:  Total cholesterol is a rough measure of all the cholesterol in your blood.  LDL is the so-called bad cholesterol. This is the type that deposits cholesterol in the walls of the arteries. You want this level to be low.  HDL is the good cholesterol because it cleans the arteries and carries the LDL away. You want this level to be high.  Triglycerides are fat that the body can either burn for energy or store. High levels are closely linked to heart disease. DESIRED LEVELS:  Total cholesterol below 200.  LDL below 100 for people at risk, below 70 for very high risk.  HDL above 50 is good, above 60 is best.  Triglycerides below 150. HOW TO LOWER YOUR CHOLESTEROL:  Diet.  Choose fish or white meat chicken and  [place], roasted or baked. Limit fatty cuts of red meat, fried foods, and processed meats, such as sausage and lunch meat.  Eat lots of fresh fruits and vegetables. Choose whole grains, beans, pasta, potatoes and cereals.  Use only small amounts of olive, corn or canola oils. Avoid butter, mayonnaise, shortening or palm kernel oils. Avoid foods with trans-fats.  Use skim/nonfat milk and low-fat/nonfat yogurt and cheeses. Avoid whole milk, cream, ice cream, egg yolks and cheeses. Healthy desserts include angel food cake, ginger snaps, animal crackers, hard candy, popsicles, and low-fat/nonfat frozen yogurt. Avoid pastries, cakes, pies and cookies.  Exercise.  A regular program helps decrease LDL and raises HDL.  Helps with weight control.  Do things that increase your activity level like  gardening, walking, or taking the stairs.  Medication.  May be prescribed by your caregiver to help lowering cholesterol and the risk for heart disease.  You may need medicine even if your levels are normal if you have several risk factors. HOME CARE INSTRUCTIONS   Follow your diet and exercise programs as suggested by your caregiver.  Take medications as directed.  Have blood work done when your caregiver feels it is necessary. MAKE SURE YOU:   Understand these instructions.  Will watch your condition.  Will get help right away if you are not doing well or get worse. Document Released: 01/06/2001 Document Revised: 07/06/2011 Document Reviewed: 06/29/2007 [facility] Patient Information 2014 [place], [place].  Vitamin D Deficiency Vitamin D is an important vitamin that your body needs. Having too little of it in your body is called a deficiency. A very bad deficiency can make your bones soft and can cause a condition called rickets.  Vitamin D is important to your body for different reasons, such as:   It helps your body absorb 2 minerals called calcium and phosphorus.  It helps make your bones  healthy.  It may prevent some diseases, such as diabetes and multiple sclerosis.  It helps your muscles and heart. You can get vitamin D in several ways. It is a natural part of some foods. The vitamin is also added to some dairy products and cereals. Some people take vitamin D supplements. Also, your body makes vitamin D when you are in the sun. It changes the sun's rays into a form of the vitamin that your body can use. CAUSES   Not eating enough foods that contain vitamin D.  Not getting enough sunlight.  Having certain digestive system diseases that make it hard to absorb vitamin D. These diseases include Crohn's disease, chronic pancreatitis, and cystic fibrosis.  Having a surgery in which part of the stomach or small intestine is removed.  Being obese. Fat cells pull vitamin D out of your blood. That means that obese people may not have enough vitamin D left in their blood and in other body tissues.  Having chronic kidney or liver disease. RISK FACTORS Risk factors are things that make you more likely to develop a vitamin D deficiency. They include:  Being older.  Not being able to get outside very much.  Living in a nursing home.  Having had broken bones.  Having weak or thin bones (osteoporosis).  Having a disease or condition that changes how your body absorbs vitamin D.  Having dark skin.  Some medicines such as seizure medicines or steroids.  Being overweight or obese. SYMPTOMS Mild cases of vitamin D deficiency may not have any symptoms. If you have a very bad case, symptoms may include:  Bone pain.  Muscle pain.  Falling often.  Broken bones caused by a minor injury, due to osteoporosis. DIAGNOSIS A blood test is the best way to tell if you have a vitamin D deficiency. TREATMENT Vitamin D deficiency can be treated in different ways. Treatment for vitamin D deficiency depends on what is causing it. Options include:  Taking vitamin D  supplements.  Taking a calcium supplement. Your caregiver will suggest what dose is best for you. HOME CARE INSTRUCTIONS  Take any supplements that your caregiver prescribes. Follow the directions carefully. Take only the suggested amount.  Have your blood tested 2 months after you start taking supplements.  Eat foods that contain vitamin D. Healthy choices include:  Fortified dairy products, cereals, or  juices. Fortified means vitamin D has been added to the food. Check the label on the package to be sure.  Fatty fish like salmon or trout.  Eggs.  Oysters.  Do not use a tanning bed.  Keep your weight at a healthy level. Lose weight if you need to.  Keep all follow-up appointments. Your caregiver will need to perform blood tests to make sure your vitamin D deficiency is going away. SEEK MEDICAL CARE IF:  You have any questions about your treatment.  You continue to have symptoms of vitamin D deficiency.  You have nausea or vomiting.  You are constipated.  You feel confused.  You have severe abdominal or back pain. MAKE SURE YOU:  Understand these instructions.  Will watch your condition.  Will get help right away if you are not doing well or get worse. Document Released: 07/06/2011 Document Revised: 08/08/2012 Document Reviewed: 07/06/2011 [facility] Patient Information 2014 [place], [place].

## 2013-04-25 NOTE — Progress Notes (Signed)
Patient ID: [name], female   DOB: 1950-08-28, 62 y.o.   MRN: [id number]   This very nice 62 y.o. DWF presents for 3 month follow up with Hypertension, Hyperlipidemia, Asthma, ADD, Migraine,  IBS, Hypothyroidism, Pre-Diabetes and Vitamin D Deficiency. She was recently seen for asthma Sx's and given Rx for Duoneb which she uses sporadically.    Hypertension predates since 2003 and she also has remote hx/o vascular HA's. BP has been controlled on a beta blocker and she does report occasional asthma Sx's. Today's BP: 130/74 mmHg . Patient denies any cardiac type chest pain, palpitations, dyspnea/orthopnea/PND, dizziness, claudication, or dependent edema.    Hyperlipidemia is controlled with diet. Last Cholesterol was 150, Triglycerides were 49, HDL 60 and LDL 80 - at goal on diet alone. Patient denies myalgias or other med SE's.    Also, the patient has history of PreDiabetes with A1c 5.8% in Mar 2013 with last A1c of 5.4% in June this year. Patient denies any symptoms of reactive hypoglycemia, diabetic polys, paresthesias or visual blurring.   Further, Patient has history of Vitamin D Deficiency of 18 in 2008 & with last vitamin D of 63. Patient supplements vitamin D without any suspected side-effects.  Medication Sig Dispense Refill  . acetaminophen (TYLENOL) 500 MG tablet Take 500 mg by mouth every 6 (six) hours as needed for pain.      Marland Kitchen aspirin 81 MG chewable tablet Chew 81 mg by mouth daily.        Marland Kitchen aspirin-acetaminophen-caffeine (EXCEDRIN MIGRAINE) 250-250-65 MG per tablet Take 2 tablets by mouth every 6 (six) hours as needed. Pain or headache      . atenolol (TENORMIN) 100 MG tablet Take 100 mg by mouth daily.      Marland Kitchen buPROPion (WELLBUTRIN SR) 150 MG 12 hr tablet Take 150 mg by mouth daily.       . calcium carbonate (OS-CAL) 600 MG TABS Take 600 mg by mouth daily.      . calcium carbonate (TUMS - DOSED IN MG ELEMENTAL CALCIUM) 500 MG chewable tablet Chew 1 tablet by mouth daily.      .  Cholecalciferol (VITAMIN D-3) 5000 UNITS TABS Take 1 tablet by mouth daily.      . hyoscyamine (LEVBID) 0.375 MG 12 hr tablet Take 0.375 mg by mouth every 12 (twelve) hours as needed. Stomach pain      . ibuprofen (ADVIL,MOTRIN) 200 MG tablet Take 200 mg by mouth every 6 (six) hours as needed for pain.      Marland Kitchen ipratropium-albuterol (DUONEB) 0.5-2.5 (3) MG/3ML SOLN Take 3 mLs by nebulization every 4 (four) hours as needed.  360 mL  5  . levothyroxine (SYNTHROID, LEVOTHROID) 100 MCG tablet Take 100 mcg by mouth daily. 1.5 Tue Thur only 1 rest of week      . magnesium gluconate (MAGONATE) 500 MG tablet Take 500 mg by mouth daily.      . Multiple Vitamin (MULTIVITAMIN WITH MINERALS) TABS Take 1 tablet by mouth daily.      . Omega-3 Fatty Acids (FISH OIL) 1000 MG CAPS Take 1 capsule by mouth daily.      . pantoprazole (PROTONIX) 40 MG tablet Take 1 tablet (40 mg total) by mouth daily.  14 tablet  1   Allergies  Allergen Reactions  . Cephalexin Anaphylaxis  . Codeine Nausea Only  . Sulfa Antibiotics Rash    PMHx:   Past Medical History  Diagnosis Date  . Hypothyroid   . Hypertension   .  ADD (attention deficit disorder with hyperactivity)   . OCD (obsessive compulsive disorder)   . IBS (irritable bowel syndrome)   . Diverticulosis   . Irregular heart rate     left bundle branch block  . Helicobacter pylori (H. pylori)   . History of IBS   . Depression   . Prediabetes   . Migraines   . Vitamin D deficiency     FHx:    Reviewed / unchanged  SHx:    Reviewed / unchanged  Systems Review: Constitutional: Denies fever, chills, wt changes, headaches, insomnia, fatigue, night sweats, change in appetite. Eyes: Denies redness, blurred vision, diplopia, discharge, itchy, watery eyes.  ENT: Denies discharge, congestion, post nasal drip, epistaxis, sore throat, earache, hearing loss, dental pain, tinnitus, vertigo, sinus pain, snoring.  CV: Denies chest pain, palpitations, irregular heartbeat,  syncope, dyspnea, diaphoresis, orthopnea, PND, claudication, edema. Respiratory: denies cough, dyspnea, DOE, pleurisy, hoarseness, laryngitis, wheezing.  Gastrointestinal: Denies dysphagia, odynophagia, heartburn, reflux, water brash, abdominal pain or cramps, nausea, vomiting, bloating, diarrhea, constipation, hematemesis, melena, hematochezia,  or hemorrhoids. Genitourinary: Denies dysuria, frequency, urgency, nocturia, hesitancy, discharge, hematuria, flank pain. Musculoskeletal: Denies arthralgias, myalgias, stiffness, jt. swelling, pain, limp, strain/sprain.  Skin: Denies pruritus, rash, hives, warts, acne, eczema, change in skin lesion(s). Neuro: No weakness, tremor, incoordination, spasms, paresthesia, or pain. Psychiatric: Denies confusion, memory loss, or sensory loss. Endo: Denies change in weight, skin, hair change.  Heme/Lymph: No excessive bleeding, bruising, orenlarged lymph nodes.  BP: 130/74  Pulse: 76  Temp: 97.9 F (36.6 C)  Resp: 18    Estimated body mass index is 30.63 kg/(m^2) as calculated from the following:   Height as of 11/16/12: 5' 4.5" (1.638 m).   Weight as of this encounter: 181 lb 3.2 oz (82.192 kg).  On Exam: Appears well nourished - in no distress. Eyes: PERRLA, EOMs, conjunctiva no swelling or erythema. Sinuses: No frontal/maxillary tenderness ENT/Mouth: EAC's clear, TM's nl w/o erythema, bulging. Nares clear w/o erythema, swelling, exudates. Oropharynx clear without erythema or exudates. Oral hygiene is good. Tongue normal, non obstructing. Hearing intact.  Neck: Supple. Thyroid nl. Car 2+/2+ without bruits, nodes or JVD. Chest: Respirations nl with BS clear & equal w/o rales, rhonchi or stridor, but few scattered wheezes noted. Cor: Heart sounds normal w/ regular rate and rhythm without sig. murmurs, gallops, clicks, or rubs. Peripheral pulses normal and equal  without edema.  Abdomen: Soft & bowel sounds normal. Non-tender w/o guarding, rebound,  hernias, masses, or organomegaly.  Lymphatics: Unremarkable.  Musculoskeletal: Full ROM all peripheral extremities, joint stability, 5/5 strength, and normal gait.  Skin: Warm, dry without exposed rashes, lesions, ecchymosis apparent.  Neuro: Cranial nerves intact, reflexes equal bilaterally. Sensory-motor testing grossly intact. Tendon reflexes grossly intact.  Pysch: Alert & oriented x 3. Insight and judgement nl & appropriate. No ideations.  Assessment and Plan:  1. Hypertension - Continue monitor blood pressure at home. Cut Atenolol 100 mg in 1/2 = 50 mg qd and add Lisinopril/HCT 20/12.5  And will taper to d/c atenolol if asthma persists.  2. Hyperlipidemia - Continue diet, exercise,& lifestyle modifications. Continue monitor periodic cholesterol/liver & renal functions   3. Pre-diabetes/Insulin Resistance - Continue diet, exercise, lifestyle modifications. Monitor appropriate labs.  4. Vitamin D Deficiency - Continue supplementation.  5. Asthma- ? Beta Blocker exascerbated  Recommended regular exercise, BP monitoring, weight control, and discussed med and SE's. Recommended labs to assess and monitor clinical status. Further disposition pending results of labs.

## 2013-04-27 DIAGNOSIS — I447 Left bundle-branch block, unspecified: Secondary | ICD-10-CM

## 2013-04-27 HISTORY — DX: Left bundle-branch block, unspecified: I44.7

## 2013-05-26 ENCOUNTER — Encounter: Payer: Self-pay | Admitting: Physician Assistant

## 2013-05-26 ENCOUNTER — Telehealth: Payer: Self-pay

## 2013-05-26 ENCOUNTER — Ambulatory Visit (INDEPENDENT_AMBULATORY_CARE_PROVIDER_SITE_OTHER): Payer: 59 | Admitting: Physician Assistant

## 2013-05-26 VITALS — BP 100/60 | HR 72 | Temp 97.9°F | Resp 16 | Ht 64.0 in | Wt 172.0 lb

## 2013-05-26 DIAGNOSIS — N3000 Acute cystitis without hematuria: Secondary | ICD-10-CM

## 2013-05-26 DIAGNOSIS — K219 Gastro-esophageal reflux disease without esophagitis: Secondary | ICD-10-CM

## 2013-05-26 DIAGNOSIS — N179 Acute kidney failure, unspecified: Secondary | ICD-10-CM

## 2013-05-26 DIAGNOSIS — E039 Hypothyroidism, unspecified: Secondary | ICD-10-CM

## 2013-05-26 DIAGNOSIS — R5383 Other fatigue: Secondary | ICD-10-CM

## 2013-05-26 DIAGNOSIS — R0600 Dyspnea, unspecified: Secondary | ICD-10-CM

## 2013-05-26 DIAGNOSIS — R5381 Other malaise: Secondary | ICD-10-CM

## 2013-05-26 LAB — IRON AND TIBC
%SAT: 23 % (ref 20–55)
Iron: 85 ug/dL (ref 42–145)
TIBC: 366 ug/dL (ref 250–470)
UIBC: 281 ug/dL (ref 125–400)

## 2013-05-26 LAB — BASIC METABOLIC PANEL WITHOUT GFR
BUN: 34 mg/dL — ABNORMAL HIGH (ref 6–23)
CO2: 29 meq/L (ref 19–32)
Calcium: 9.8 mg/dL (ref 8.4–10.5)
Chloride: 98 meq/L (ref 96–112)
Creat: 2.02 mg/dL — ABNORMAL HIGH (ref 0.50–1.10)
GFR, Est African American: 30 mL/min — ABNORMAL LOW
GFR, Est Non African American: 26 mL/min — ABNORMAL LOW
Glucose, Bld: 82 mg/dL (ref 70–99)
Potassium: 4.7 meq/L (ref 3.5–5.3)
Sodium: 135 meq/L (ref 135–145)

## 2013-05-26 LAB — CBC WITH DIFFERENTIAL/PLATELET
Basophils Absolute: 0 K/uL (ref 0.0–0.1)
Basophils Relative: 1 % (ref 0–1)
Eosinophils Absolute: 0.4 K/uL (ref 0.0–0.7)
Eosinophils Relative: 6 % — ABNORMAL HIGH (ref 0–5)
HCT: 41.5 % (ref 36.0–46.0)
Hemoglobin: 14.2 g/dL (ref 12.0–15.0)
Lymphocytes Relative: 44 % (ref 12–46)
Lymphs Abs: 2.8 K/uL (ref 0.7–4.0)
MCH: 31.6 pg (ref 26.0–34.0)
MCHC: 34.2 g/dL (ref 30.0–36.0)
MCV: 92.4 fL (ref 78.0–100.0)
Monocytes Absolute: 0.5 K/uL (ref 0.1–1.0)
Monocytes Relative: 8 % (ref 3–12)
Neutro Abs: 2.7 K/uL (ref 1.7–7.7)
Neutrophils Relative %: 41 % — ABNORMAL LOW (ref 43–77)
Platelets: 376 K/uL (ref 150–400)
RBC: 4.49 MIL/uL (ref 3.87–5.11)
RDW: 14 % (ref 11.5–15.5)
WBC: 6.4 K/uL (ref 4.0–10.5)

## 2013-05-26 LAB — MAGNESIUM: MAGNESIUM: 2 mg/dL (ref 1.5–2.5)

## 2013-05-26 LAB — VITAMIN B12: Vitamin B-12: 1138 pg/mL — ABNORMAL HIGH (ref 211–911)

## 2013-05-26 LAB — TSH: TSH: 0.957 u[IU]/mL (ref 0.350–4.500)

## 2013-05-26 LAB — HEPATIC FUNCTION PANEL
ALT: 14 U/L (ref 0–35)
AST: 16 U/L (ref 0–37)
Albumin: 3.8 g/dL (ref 3.5–5.2)
Alkaline Phosphatase: 72 U/L (ref 39–117)
Bilirubin, Direct: 0.1 mg/dL (ref 0.0–0.3)
Indirect Bilirubin: 0.3 mg/dL (ref 0.2–1.2)
Total Bilirubin: 0.4 mg/dL (ref 0.2–1.2)
Total Protein: 7 g/dL (ref 6.0–8.3)

## 2013-05-26 LAB — FERRITIN: FERRITIN: 56 ng/mL (ref 10–291)

## 2013-05-26 MED ORDER — CIPROFLOXACIN HCL 500 MG PO TABS: 500.0000 mg | ORAL_TABLET | Freq: Two times a day (BID) | ORAL | Status: AC

## 2013-05-26 MED ORDER — SUCRALFATE 1 G PO TABS: 1.0000 g | ORAL_TABLET | Freq: Four times a day (QID) | ORAL | Status: DC

## 2013-05-26 NOTE — Patient Instructions (Addendum)
Diet for Gastroesophageal Reflux Disease, Adult Reflux (acid reflux) is when acid from your stomach flows up into the esophagus. When acid comes in contact with the esophagus, the acid causes irritation and soreness (inflammation) in the esophagus. When reflux happens often or so severely that it causes damage to the esophagus, it is called gastroesophageal reflux disease (GERD). Nutrition therapy can help ease the discomfort of GERD. FOODS OR DRINKS TO AVOID OR LIMIT  Smoking or chewing tobacco. Nicotine is one of the most potent stimulants to acid production in the gastrointestinal tract.  Caffeinated and decaffeinated coffee and black tea.  Regular or low-calorie carbonated beverages or energy drinks (caffeine-free carbonated beverages are allowed).   Strong spices, such as black pepper, white pepper, red pepper, cayenne, curry powder, and chili powder.  Peppermint or spearmint.  Chocolate.  High-fat foods, including meats and fried foods. Extra added fats including oils, butter, salad dressings, and nuts. Limit these to less than 8 tsp per day.  Fruits and vegetables if they are not tolerated, such as citrus fruits or tomatoes.  Alcohol.  Any food that seems to aggravate your condition. If you have questions regarding your diet, call your caregiver or a registered dietitian. OTHER THINGS THAT MAY HELP GERD INCLUDE:   Eating your meals slowly, in a relaxed setting.  Eating 5 to 6 small meals per day instead of 3 large meals.  Eliminating food for a period of time if it causes distress.  Not lying down until 3 hours after eating a meal.  Keeping the head of your bed raised 6 to 9 inches (15 to 23 cm) by using a foam wedge or blocks under the legs of the bed. Lying flat may make symptoms worse.  Being physically active. Weight loss may be helpful in reducing reflux in overweight or obese adults.  Wear loose fitting clothing EXAMPLE MEAL PLAN This meal plan is approximately  2,000 calories based on [email or web address] meal planning guidelines. Breakfast   cup cooked oatmeal.  1 cup strawberries.  1 cup low-fat milk.  1 oz almonds. Snack  1 cup cucumber slices.  6 oz yogurt (made from low-fat or fat-free milk). Lunch  2 slice whole-wheat bread.  2 oz sliced [place].  2 tsp mayonnaise.  1 cup blueberries.  1 cup snap peas. Snack  6 whole-wheat crackers.  1 oz string cheese. Dinner   cup brown rice.  1 cup mixed veggies.  1 tsp olive oil.  3 oz grilled fish. Document Released: 04/13/2005 Document Revised: 07/06/2011 Document Reviewed: 02/27/2011 [facility] Patient Information 2014 [place], [place]. Chronic Obstructive Pulmonary Disease Chronic obstructive pulmonary disease (COPD) is a common lung condition in which airflow from the lungs is limited. COPD is a general term that can be used to describe many different lung problems that limit airflow, including both chronic bronchitis and emphysema. If you have COPD, your lung function will probably never return to normal, but there are measures you can take to improve lung function and make yourself feel better.  CAUSES  Smoking (common).  Exposure to secondhand smoke.  Genetic problems. Chronic inflammatory lung diseases or recurrent infections. SYMPTOMS  Shortness of breath, especially with physical activity.  Deep, persistent (chronic) cough with a large amount of thick mucus.  Wheezing.  Rapid breaths (tachypnea).  [place] or bluish discoloration (cyanosis) of the skin, especially in fingers, toes, or lips.  Fatigue.  Weight loss.  Frequent infections or episodes when breathing symptoms become much worse (exacerbations).  Chest tightness. DIAGNOSIS  Your healthcare provider will take a medical history and perform a physical examination to make the initial diagnosis. Additional tests for COPD may include:  Lung (pulmonary) function tests. Chest X-ray. CT scan. Blood  tests. TREATMENT  Treatment available to help you feel better when you have COPD include:  Inhaler and nebulizer medicines. These help manage the symptoms of COPD and make your breathing more comfortable Supplemental oxygen. Supplemental oxygen is only helpful if you have a low oxygen level in your blood.  Exercise and physical activity. These are beneficial for nearly all people with COPD. Some people may also benefit from a pulmonary rehabilitation program. HOME CARE INSTRUCTIONS  Take all medicines (inhaled or pills) as directed by your health care provider. Only take over-the-counter or prescription medicines for pain, fever, or discomfort as directed by your health care provider.  Avoid over-the-counter medicines or cough syrups that dry up your airway (such as antihistamines) and slow down the elimination of secretions unless instructed otherwise by your healthcare provider.  If you are a smoker, the most important thing that you can do is stop smoking. Continuing to smoke will cause further lung damage and breathing trouble. Ask your health care provider for help with quitting smoking. He or she can direct you to community resources or hospitals that provide support. Avoid exposure to irritants such as smoke, chemicals, and fumes that aggravate your breathing. Use oxygen therapy and pulmonary rehabilitation if directed by your health care provider. If you require home oxygen therapy, ask your healthcare provider whether you should purchase a pulse oximeter to measure your oxygen level at home.  Avoid contact with individuals who have a contagious illness. Avoid extreme temperature and humidity changes. Eat healthy foods. Eating smaller, more frequent meals and resting before meals may help you maintain your strength. Stay active, but balance activity with periods of rest. Exercise and physical activity will help you maintain your ability to do things you want to do. Preventing infection  and hospitalization is very important when you have COPD. Make sure to receive all the vaccines your health care provider recommends, especially the pneumococcal and influenza vaccines. Ask your healthcare provider whether you need a pneumonia vaccine. Learn and use relaxation techniques to manage stress. Learn and use controlled breathing techniques as directed by your health care provider. Controlled breathing techniques include:  Pursed lip breathing. Start by breathing in (inhaling) through your nose for 1 second. Then, purse your lips as if you were going to whistle and breathe out (exhale) through the pursed lips for 2 seconds.  Diaphragmatic breathing. Start by putting one hand on your abdomen just above your waist. Inhale slowly through your nose. The hand on your abdomen should move out. Then purse your lips and exhale slowly. You should be able to feel the hand on your abdomen moving in as you exhale.  Learn and use controlled coughing to clear mucus from your lungs. Controlled coughing is a series of short, progressive coughs. The steps of controlled coughing are:  Lean your head slightly forward.  Breathe in deeply using diaphragmatic breathing.  Try to hold your breath for 3 seconds.  Keep your mouth slightly open while coughing twice.  Spit any mucus out into a tissue.  Rest and repeat the steps once or twice as needed. SEEK MEDICAL CARE IF:  You are coughing up more mucus than usual.  There is a change in the color or thickness of your mucus.  Your breathing is more labored than usual.  Your breathing is faster than usual.  SEEK IMMEDIATE MEDICAL CARE IF:  You have shortness of breath while you are resting.  You have shortness of breath that prevents you from: Being able to talk.  Performing your usual physical activities.  You have chest pain lasting longer than 5 minutes.  Your skin color is more cyanotic than usual. You measure low oxygen saturations for  longer than 5 minutes with a pulse oximeter. MAKE SURE YOU:  Understand these instructions. Will watch your condition. Will get help right away if you are not doing well or get worse. Document Released: 01/21/2005 Document Revised: 02/01/2013 Document Reviewed: 12/08/2012 [facility] Patient Information 2014 [place], [place].

## 2013-05-26 NOTE — Progress Notes (Signed)
HPI 63 y.o. female presents for one month follow up. Her last CBC was abnormal at her 3 month follow up and she is here for recheck of that. She states that after that visit she had the flu for a week, now she has been feeling tired since then. She has some epigastric pain, SOB, nausea, lower back pain, and frequency, urgency, and foul odor to her urine for the last 3-4 days. She has been taking a lot of Ibuprofen and Execedrin for the flu and lower back pain. She is on protonix and she denies dark/black stools or blood in her stool.   Increase SOB with exertion since having the flu. She states she has dizziness,nausea, and sweating with it.  She denies CP, after resting it lasts for 10-15 mins. She does have COPD but denies cough, wheezing, fever or chills.  Current Medications:  Current Outpatient Prescriptions on File Prior to Visit  Medication Sig Dispense Refill  . acetaminophen (TYLENOL) 500 MG tablet Take 500 mg by mouth every 6 (six) hours as needed for pain.      Marland Kitchen aspirin 81 MG chewable tablet Chew 81 mg by mouth daily.        Marland Kitchen aspirin-acetaminophen-caffeine (EXCEDRIN MIGRAINE) 250-250-65 MG per tablet Take 2 tablets by mouth every 6 (six) hours as needed. Pain or headache      . atenolol (TENORMIN) 100 MG tablet Take 100 mg by mouth daily.      Marland Kitchen buPROPion (WELLBUTRIN SR) 150 MG 12 hr tablet Take 150 mg by mouth daily.       . calcium carbonate (OS-CAL) 600 MG TABS Take 600 mg by mouth daily.      . calcium carbonate (TUMS - DOSED IN MG ELEMENTAL CALCIUM) 500 MG chewable tablet Chew 1 tablet by mouth daily.      . Cholecalciferol (VITAMIN D-3) 5000 UNITS TABS Take 1 tablet by mouth daily.      Marland Kitchen HYDROcodone-acetaminophen (NORCO) 5-325 MG per tablet 1/2 to 1 tablet every 3 to 4 hours as needed for severe cough or pain  50 tablet  0  . hyoscyamine (LEVBID) 0.375 MG 12 hr tablet Take 0.375 mg by mouth every 12 (twelve) hours as needed. Stomach pain      . hyoscyamine (LEVSIN SL) 0.125 MG SL  tablet Place 1 tablet (0.125 mg total) under the tongue every 4 (four) hours as needed. Stomach pain  100 tablet  99  . hyoscyamine (LEVSIN/SL) 0.125 MG SL tablet Place 1 tablet (0.125 mg total) under the tongue every 4 (four) hours as needed.  100 tablet  99  . ibuprofen (ADVIL,MOTRIN) 200 MG tablet Take 200 mg by mouth every 6 (six) hours as needed for pain.      Marland Kitchen ipratropium-albuterol (DUONEB) 0.5-2.5 (3) MG/3ML SOLN Take 3 mLs by nebulization every 4 (four) hours as needed.  360 mL  5  . levothyroxine (SYNTHROID, LEVOTHROID) 100 MCG tablet Take 100 mcg by mouth daily. 1.5 Tue Thur only 1 rest of week      . lisinopril-hydrochlorothiazide (PRINZIDE,ZESTORETIC) 20-12.5 MG per tablet Take 1 tablet by mouth daily. For BP  90 tablet  99  . magnesium gluconate (MAGONATE) 500 MG tablet Take 500 mg by mouth daily.      . methylphenidate (RITALIN) 20 MG tablet 1/2 to 1 tab 3 x daily as needed for ADD or alertness  90 tablet  0  . Multiple Vitamin (MULTIVITAMIN WITH MINERALS) TABS Take 1 tablet by mouth daily.      Marland Kitchen  Omega-3 Fatty Acids (FISH OIL) 1000 MG CAPS Take 1 capsule by mouth daily.      . ondansetron (ZOFRAN) 8 MG tablet Take 1 tablet (8 mg total) by mouth every 8 (eight) hours as needed for nausea or vomiting.  90 tablet  1  . pantoprazole (PROTONIX) 40 MG tablet Take 1 tablet (40 mg total) by mouth daily.  14 tablet  1  . predniSONE (DELTASONE) 20 MG tablet Take 1 tablet (20 mg total) by mouth See admin instructions. 1 tab 3 x day for 3 days, then 1 tab 2 x day for 3 days, then 1 tab 1 x day for 5 days  20 tablet  0   No current facility-administered medications on file prior to visit.   Medical History:  Past Medical History  Diagnosis Date  . Hypothyroid   . Hypertension   . ADD (attention deficit disorder with hyperactivity)   . OCD (obsessive compulsive disorder)   . IBS (irritable bowel syndrome)   . Diverticulosis   . Irregular heart rate     left bundle branch block  .  Helicobacter pylori (H. pylori)   . History of IBS   . Depression   . Prediabetes   . Migraines   . Vitamin D deficiency    Allergies:  Allergies  Allergen Reactions  . Cephalexin Anaphylaxis  . Codeine Nausea Only  . Sulfa Antibiotics Rash    ROS  Review of Systems: [y] = yes, [ ]  = no   General: Weight gain [ ] ; Weight loss [ ] ; Anorexia [ ] ; Fatigue [ ] ; Fever [ ] ; Chills [ ] ; Weakness [ y]  Cardiac: Chest pain/pressure [ ] ; Resting SOB [ ] ; Exertional SOB Blue.Reese ]; Orthopnea [ ] ; Pedal Edema [ ] ; Palpitations [ ] ; Syncope [ ] ; Presyncope [ ] ; Paroxysmal nocturnal dyspnea[ ]   Pulmonary: Cough [ ] ; Wheezing[ ] ; Hemoptysis[ ] ; Sputum [ ] ; Snoring [ ]   GI: Vomiting[ ] ; Dysphagia[ ] ; Melena[ ] ; Hematochezia [ ] ; Heartburn[y ]; Abdominal pain [ ] ; Constipation [ ] ; Diarrhea [ ] ; BRBPR [ ]   GU: Hematuria[ ] ; Dysuria Blue.Reese ]; Nocturia[ ]   Vascular: Pain in legs with walking [ ] ; Pain in feet with lying flat [ ] ; Non-healing sores [ ] ; Stroke [ ] ; TIA [ ] ; Slurred speech [ ] ;  Neuro: Headaches[ ] ; Vertigo[ ] ; Seizures[ ] ; Paresthesias[ ] ;Blurred vision [ ] ; Diplopia [ ] ; Vision changes [ ]   Ortho/Skin: Arthritis [ ] ; Joint pain [ ] ; Muscle pain [ ] ; Joint swelling [ ] ; Back Pain Blue.Reese ]; Rash [ ]   Psych: Depression[ ] ; Anxiety[ ]   Heme: Bleeding problems [ ] ; Clotting disorders [ ] ; Anemia [ ]   Endocrine: Diabetes [ ] ; Thyroid dysfunction[ ]    Family history- Review and unchanged Social history- Review and unchanged Physical Exam: Filed Vitals:   05/26/13 0901  BP: 100/60  Pulse: 72  Temp: 97.9 F (36.6 C)  Resp: 16   Filed Weights   05/26/13 0901  Weight: 172 lb (78.019 kg)   General Appearance: Well nourished, in no apparent distress. Eyes: PERRLA, EOMs, conjunctiva no swelling or erythema Sinuses: No Frontal/maxillary tenderness ENT/Mouth: Ext aud canals clear, TMs without erythema, bulging. No erythema, swelling, or exudate on post pharynx.  Tonsils not swollen or erythematous.  Hearing normal.  Neck: Supple, thyroid normal.  Respiratory: Respiratory effort normal, BS equal bilaterally without rales, rhonchi, wheezing or stridor.  Cardio: RRR with no MRGs. Brisk peripheral pulses without edema.  Abdomen: Soft, +  BS.  epigastric tender, no guarding, rebound, hernias, masses. Lymphatics: Non tender without lymphadenopathy.  Musculoskeletal: Full ROM, 5/5 strength, normal gait.  Skin: Warm, dry without rashes, lesions, ecchymosis.  Neuro: Cranial nerves intact. Normal muscle tone, no cerebellar symptoms. Sensation intact.  Psych: Awake and oriented X 3, normal affect, Insight and Judgment appropriate.   Assessment and Plan:  Epigastric pain/GERD likely NSAID induced- continue protonix, Carafate given, stop NSAIDS and has Norco at home so continue this.  Fatigue- rule out anemia from GERD, check TSH UTI- check urine, since it is Friday we will start her on Cipro.  SOB with exertion, history of LBBB unchanged, and history of smoking, bradycardia- Suggest seeing cardio again.  Continue diet and meds as discussed. Further disposition pending results of labs. OVER 30 minutes of exam, counseling, chart review, referral performed   Vicie Mutters 9:08 AM

## 2013-05-26 NOTE — Telephone Encounter (Signed)
Benjamine Mola called from [place]. Cipro & Sucralfate was called in for pt. There is an interaction between 2 meds and Benjamine Mola wants to know if you want to alter meds or call something else in?

## 2013-05-27 ENCOUNTER — Telehealth: Payer: Self-pay | Admitting: Physician Assistant

## 2013-05-27 ENCOUNTER — Encounter: Payer: Self-pay | Admitting: Physician Assistant

## 2013-05-27 LAB — URINALYSIS, ROUTINE W REFLEX MICROSCOPIC
Bilirubin Urine: NEGATIVE
GLUCOSE, UA: NEGATIVE mg/dL
Nitrite: POSITIVE — AB
PH: 5.5 (ref 5.0–8.0)
Protein, ur: NEGATIVE mg/dL
Specific Gravity, Urine: 1.013 (ref 1.005–1.030)
Urobilinogen, UA: 0.2 mg/dL (ref 0.0–1.0)

## 2013-05-27 LAB — URINALYSIS, MICROSCOPIC ONLY
Casts: NONE SEEN
Crystals: NONE SEEN
SQUAMOUS EPITHELIAL / LPF: NONE SEEN
WBC, UA: >50 WBC/hpf — AB (ref <3)

## 2013-05-27 NOTE — Telephone Encounter (Signed)
Patient has a history of pyelonephritis, after reviewing her labs I called the patient at home to inform her it did look like she had an infection although her WBC is normal, her kidney function is currently elevated and she has acute renal failure due to dehydration/infection. She states that she is feeling better other than nausea which she has zofran for and it is helping. She does not want to go to the ER at this time. She will continue the Cipro and push fluids, if she has any chest pain, shortness of breath, severe back pain, unable to hold anything down, fever/shakes, or stops urinating then she will go to the ER. She will call on Monday and we may need to get a CT AB.

## 2013-05-28 LAB — URINE CULTURE

## 2013-05-29 ENCOUNTER — Encounter: Payer: Self-pay | Admitting: Physician Assistant

## 2013-05-29 ENCOUNTER — Ambulatory Visit (INDEPENDENT_AMBULATORY_CARE_PROVIDER_SITE_OTHER): Payer: 59 | Admitting: Physician Assistant

## 2013-05-29 ENCOUNTER — Other Ambulatory Visit: Payer: 59

## 2013-05-29 VITALS — BP 100/60 | HR 68 | Temp 98.1°F | Resp 16 | Wt 175.0 lb

## 2013-05-29 DIAGNOSIS — R109 Unspecified abdominal pain: Secondary | ICD-10-CM

## 2013-05-29 DIAGNOSIS — N179 Acute kidney failure, unspecified: Secondary | ICD-10-CM

## 2013-05-29 LAB — HEPATIC FUNCTION PANEL
ALT: 15 U/L (ref 0–35)
AST: 13 U/L (ref 0–37)
Albumin: 3.8 g/dL (ref 3.5–5.2)
Alkaline Phosphatase: 62 U/L (ref 39–117)
BILIRUBIN DIRECT: 0.1 mg/dL (ref 0.0–0.3)
BILIRUBIN TOTAL: 0.3 mg/dL (ref 0.2–1.2)
Indirect Bilirubin: 0.2 mg/dL (ref 0.2–1.2)
Total Protein: 6.3 g/dL (ref 6.0–8.3)

## 2013-05-29 LAB — CBC WITH DIFFERENTIAL/PLATELET
Basophils Absolute: 0.1 10*3/uL (ref 0.0–0.1)
Basophils Relative: 1 % (ref 0–1)
EOS PCT: 7 % — AB (ref 0–5)
Eosinophils Absolute: 0.4 10*3/uL (ref 0.0–0.7)
HCT: 38.8 % (ref 36.0–46.0)
HEMOGLOBIN: 13.1 g/dL (ref 12.0–15.0)
LYMPHS ABS: 2.3 10*3/uL (ref 0.7–4.0)
Lymphocytes Relative: 42 % (ref 12–46)
MCH: 31 pg (ref 26.0–34.0)
MCHC: 33.8 g/dL (ref 30.0–36.0)
MCV: 91.9 fL (ref 78.0–100.0)
MONOS PCT: 11 % (ref 3–12)
Monocytes Absolute: 0.6 10*3/uL (ref 0.1–1.0)
NEUTROS PCT: 39 % — AB (ref 43–77)
Neutro Abs: 2.2 10*3/uL (ref 1.7–7.7)
Platelets: 331 10*3/uL (ref 150–400)
RBC: 4.22 MIL/uL (ref 3.87–5.11)
RDW: 13.9 % (ref 11.5–15.5)
WBC: 5.5 10*3/uL (ref 4.0–10.5)

## 2013-05-29 LAB — BASIC METABOLIC PANEL WITH GFR
BUN: 46 mg/dL — ABNORMAL HIGH (ref 6–23)
CO2: 28 meq/L (ref 19–32)
Calcium: 9.6 mg/dL (ref 8.4–10.5)
Chloride: 102 mEq/L (ref 96–112)
Creat: 2.07 mg/dL — ABNORMAL HIGH (ref 0.50–1.10)
GFR, Est African American: 29 mL/min — ABNORMAL LOW
GFR, Est Non African American: 25 mL/min — ABNORMAL LOW
Glucose, Bld: 83 mg/dL (ref 70–99)
Potassium: 4.3 mEq/L (ref 3.5–5.3)
SODIUM: 137 meq/L (ref 135–145)

## 2013-05-29 NOTE — Progress Notes (Signed)
   Subjective:    Patient ID: [name], female    DOB: 1950-07-30, 63 y.o.   MRN: [id number]  HPI 63 y.o. female with history of pyelonephritis presented on Friday with urinary symptoms and back pain, no fever/chills. Her urine showed UTI sensitive to the cipro which she was placed on Friday. He kidney function showed she was in acute renal failure, we were in contact over the weekend and she did not want to go to the ER. So she has been on her cipro, taking zofran for nausea, and has been pushing fluids.   Today she states the urinary symptoms are better, lower back pain still there, more positional and has improved. She still has nausea, dizziness, and weakness. Nausea is worse with moving around. She states her BP has been lower at home. She is on 1/2 of atenolol 100mg  and was taken off her HCTZ, she is still on the lisinopril though which was started in December.    Review of Systems  Constitutional: Positive for fatigue. Negative for fever and chills.  HENT: Negative.   Respiratory: Negative.   Cardiovascular: Negative.   Gastrointestinal: Negative.   Genitourinary: Negative.   Musculoskeletal: Positive for back pain.  Skin: Negative.   Neurological: Positive for weakness and light-headedness. Negative for tremors, seizures, syncope, speech difficulty, numbness and headaches.  Psychiatric/Behavioral: Negative.        Objective:   Physical Exam  Constitutional: She is oriented to person, place, and time. She appears well-developed and well-nourished.  Neck: Normal range of motion. Neck supple.  Cardiovascular: Normal rate and regular rhythm.   Pulmonary/Chest: Effort normal and breath sounds normal.  Abdominal: Soft. Bowel sounds are normal. She exhibits no distension and no mass. There is tenderness (epigastric tenderness). There is no rebound and no guarding.  Musculoskeletal: Normal range of motion. She exhibits no tenderness.  Neurological: She is alert and oriented to  person, place, and time.  Skin: Skin is warm and dry.      Assessment & Plan:  UTI - finish Cipro will recheck urine in 2-4 weeks Acute kidney failure- will stop the lisinopril, she is not on any NSAIDS, will get stat BMP, CBC.    With epigastric pain and lower back pain still with nausea and kidney failure will get CT AB no  constrast Epigastric pain/nausea- check CBC, LFTs- continue protonix, may need GI consult

## 2013-05-29 NOTE — Patient Instructions (Signed)
Acute Kidney Injury No NSAIDS Stop lisinopril  Acute kidney injury is a disease in which there is sudden (acute) damage to the kidneys. The kidneys are 2 organs that lie on either side of the spine between the middle of the back and the front of the abdomen. The kidneys:  Remove wastes and extra water from the blood.   Produce important hormones. These help keep bones strong, regulate blood pressure, and help create red blood cells.   Balance the fluids and chemicals in the blood and tissues. A small amount of kidney damage may not cause problems, but a large amount of damage may make it difficult or impossible for the kidneys to work the way they should. Acute kidney injury may develop into long-lasting (chronic) kidney disease. It may also develop into a life-threatening disease called end-stage kidney disease. Acute kidney injury can get worse very quickly, so it should be treated right away. Early treatment may prevent other kidney diseases from developing.  CAUSES   A problem with blood flow to the kidneys. This may be caused by:   Blood loss.   Heart disease.   Severe burns.   Liver disease.  Direct damage to the kidneys. This may be caused by:  Some medicines.   A kidney infection.   Poisoning or consuming toxic substances.   A surgical wound.   A blow to the kidney area.   A problem with urine flow. This may be caused by:   Cancer.   Kidney stones.   An enlarged prostate. SYMPTOMS   Swelling (edema) of the legs, ankles, or feet.   Tiredness (lethargy).   Nausea or vomiting.   Confusion.   Problems with urination, such as:   Painful or burning feeling during urination.   Decreased urine production.   Frequent accidents in children who are potty trained.   Bloody urine.   Muscle twitches and cramps.   Shortness of breath.   Seizures.   Chest pain or pressure. Sometimes, no symptoms are present. DIAGNOSIS Acute  kidney injury may be detected and diagnosed by tests, including blood, urine, imaging, or kidney biopsy tests.  TREATMENT Treatment of acute kidney injury varies depending on the cause and severity of the kidney damage. In mild cases, no treatment may be needed. The kidneys may heal on their own. If acute kidney injury is more severe, your caregiver will treat the cause of the kidney damage, help the kidneys heal, and prevent complications from occurring. Severe cases may require a procedure to remove toxic wastes from the body (dialysis) or surgery to repair kidney damage. Surgery may involve:   Repair of a torn kidney.   Removal of an obstruction. Most of the time, you will need to stay overnight at the hospital.  HOME CARE INSTRUCTIONS:  Follow your prescribed diet.  Only take over-the-counter or prescription medicines as directed by your caregiver.  Do not take any new medicines (prescription, over-the-counter, or nutritional supplements) unless approved by your caregiver. Many medicines can worsen your kidney damage or need to have the dose adjusted.   Keep all follow-up appointments as directed by your caregiver.  Observe your condition to make sure you are healing as expected. SEEK IMMEDIATE MEDICAL CARE IF:  You are feeling ill or have severe pain in the back or side.   Your symptoms return or you have new symptoms.  You have any symptoms of end-stage kidney disease. These include:   Persistent itchiness.   Loss of appetite.  Headaches.   Abnormally dark or light skin.  Numbness in the hands or feet.   Easy bruising.   Frequent hiccups.   Menstruation stops.   You have a fever.  You have increased urine production.  You have pain or bleeding when urinating. MAKE SURE YOU:   Understand these instructions.  Will watch your condition.  Will get help right away if you are not doing well or get worse Document Released: 10/27/2010 Document  Revised: 08/08/2012 Document Reviewed: 12/11/2011 [facility] Patient Information 2014 [place].

## 2013-05-29 NOTE — Addendum Note (Signed)
Addended by: Vicie Mutters R on: 05/29/2013 08:51 AM   Modules accepted: Orders

## 2013-05-30 ENCOUNTER — Ambulatory Visit (HOSPITAL_COMMUNITY)
Admission: RE | Admit: 2013-05-30 | Discharge: 2013-05-30 | Disposition: A | Discharge status: Home / Self Care | Payer: 59 | Source: Ambulatory Visit | Attending: Physician Assistant | Admitting: Physician Assistant

## 2013-05-30 ENCOUNTER — Encounter (HOSPITAL_COMMUNITY): Payer: Self-pay

## 2013-05-30 DIAGNOSIS — R109 Unspecified abdominal pain: Secondary | ICD-10-CM | POA: Insufficient documentation

## 2013-05-30 DIAGNOSIS — N179 Acute kidney failure, unspecified: Secondary | ICD-10-CM

## 2013-05-30 DIAGNOSIS — M549 Dorsalgia, unspecified: Secondary | ICD-10-CM | POA: Insufficient documentation

## 2013-05-30 DIAGNOSIS — I700 Atherosclerosis of aorta: Secondary | ICD-10-CM | POA: Insufficient documentation

## 2013-05-31 ENCOUNTER — Other Ambulatory Visit: Payer: Self-pay | Admitting: Physician Assistant

## 2013-05-31 DIAGNOSIS — N179 Acute kidney failure, unspecified: Secondary | ICD-10-CM

## 2013-06-01 ENCOUNTER — Other Ambulatory Visit: Payer: 59

## 2013-06-01 DIAGNOSIS — N179 Acute kidney failure, unspecified: Secondary | ICD-10-CM

## 2013-06-01 LAB — BASIC METABOLIC PANEL WITH GFR
BUN: 23 mg/dL (ref 6–23)
CALCIUM: 9.3 mg/dL (ref 8.4–10.5)
CO2: 31 meq/L (ref 19–32)
CREATININE: 1.3 mg/dL — AB (ref 0.50–1.10)
Chloride: 101 mEq/L (ref 96–112)
GFR, EST AFRICAN AMERICAN: 51 mL/min — AB
GFR, Est Non African American: 44 mL/min — ABNORMAL LOW
Glucose, Bld: 116 mg/dL — ABNORMAL HIGH (ref 70–99)
Potassium: 4.2 mEq/L (ref 3.5–5.3)
SODIUM: 136 meq/L (ref 135–145)

## 2013-06-05 ENCOUNTER — Other Ambulatory Visit: Payer: Self-pay | Admitting: Physician Assistant

## 2013-06-05 DIAGNOSIS — I10 Essential (primary) hypertension: Secondary | ICD-10-CM

## 2013-06-06 ENCOUNTER — Ambulatory Visit: Payer: Self-pay | Admitting: Internal Medicine

## 2013-06-13 ENCOUNTER — Institutional Professional Consult (permissible substitution): Payer: 59 | Admitting: Cardiology

## 2013-06-19 ENCOUNTER — Other Ambulatory Visit: Payer: Self-pay | Admitting: Internal Medicine

## 2013-07-04 ENCOUNTER — Institutional Professional Consult (permissible substitution): Payer: 59 | Admitting: Cardiology

## 2013-07-04 ENCOUNTER — Ambulatory Visit (INDEPENDENT_AMBULATORY_CARE_PROVIDER_SITE_OTHER): Payer: 59 | Admitting: *Deleted

## 2013-07-04 DIAGNOSIS — R300 Dysuria: Secondary | ICD-10-CM

## 2013-07-04 DIAGNOSIS — Z79899 Other long term (current) drug therapy: Secondary | ICD-10-CM

## 2013-07-04 LAB — BASIC METABOLIC PANEL WITH GFR
BUN: 17 mg/dL (ref 6–23)
CALCIUM: 9 mg/dL (ref 8.4–10.5)
CO2: 30 mEq/L (ref 19–32)
Chloride: 105 mEq/L (ref 96–112)
Creat: 1.14 mg/dL — ABNORMAL HIGH (ref 0.50–1.10)
GFR, EST AFRICAN AMERICAN: 60 mL/min
GFR, EST NON AFRICAN AMERICAN: 52 mL/min — AB
GLUCOSE: 90 mg/dL (ref 70–99)
POTASSIUM: 3.8 meq/L (ref 3.5–5.3)
Sodium: 141 mEq/L (ref 135–145)

## 2013-07-04 NOTE — Progress Notes (Signed)
Patient ID: [name], female   DOB: 04-Apr-1951, 63 y.o.   MRN: [id number] Patient presents for recheck of BMET per Vicie Mutters, PA-C orders.  Also has c/o dysuria and UTI symptoms.  Requested UA and C&S.  Per Dr. Melford Aase, ok to recheck urine while patient here with current symptoms.

## 2013-07-05 ENCOUNTER — Institutional Professional Consult (permissible substitution): Payer: 59 | Admitting: Cardiology

## 2013-07-05 LAB — URINALYSIS, ROUTINE W REFLEX MICROSCOPIC
Bilirubin Urine: NEGATIVE
GLUCOSE, UA: NEGATIVE mg/dL
HGB URINE DIPSTICK: NEGATIVE
Ketones, ur: NEGATIVE mg/dL
Nitrite: NEGATIVE
Protein, ur: NEGATIVE mg/dL
Specific Gravity, Urine: <1.005 — ABNORMAL LOW (ref 1.005–1.030)
Urobilinogen, UA: 0.2 mg/dL (ref 0.0–1.0)
pH: 6 (ref 5.0–8.0)

## 2013-07-05 LAB — URINE CULTURE
Colony Count: NO GROWTH
Organism ID, Bacteria: NO GROWTH

## 2013-07-05 LAB — URINALYSIS, MICROSCOPIC ONLY
Bacteria, UA: NONE SEEN
CASTS: NONE SEEN
Crystals: NONE SEEN
Squamous Epithelial / LPF: NONE SEEN

## 2013-07-24 ENCOUNTER — Encounter: Payer: Self-pay | Admitting: Cardiovascular Disease

## 2013-07-24 ENCOUNTER — Ambulatory Visit (INDEPENDENT_AMBULATORY_CARE_PROVIDER_SITE_OTHER): Payer: 59 | Admitting: Cardiovascular Disease

## 2013-07-24 VITALS — BP 130/94 | HR 71 | Ht 64.0 in | Wt 174.0 lb

## 2013-07-24 DIAGNOSIS — I447 Left bundle-branch block, unspecified: Secondary | ICD-10-CM

## 2013-07-24 DIAGNOSIS — I10 Essential (primary) hypertension: Secondary | ICD-10-CM

## 2013-07-24 DIAGNOSIS — R5383 Other fatigue: Secondary | ICD-10-CM

## 2013-07-24 DIAGNOSIS — Z79899 Other long term (current) drug therapy: Secondary | ICD-10-CM

## 2013-07-24 DIAGNOSIS — N19 Unspecified kidney failure: Secondary | ICD-10-CM

## 2013-07-24 DIAGNOSIS — R5381 Other malaise: Secondary | ICD-10-CM

## 2013-07-24 NOTE — Progress Notes (Signed)
Patient ID: [name], female   DOB: 01-23-1951, 63 y.o.   MRN: [id number]   63 yo referred for LBBB  Review of ECGls in [place] shows this was present in 2013 She indicates it has been present since mid 90's  She was started on ACE recently and had nausea lightheadedness and diabphoresis Had acute renal failure Symptoms improved stopping med  On chronic beta blocker no documented AV block.  Indicates echo over 10 years ago.  Non constast CT in ER normal 05/29/13 no calculi.  She has mild fatigue and dyspnea.  No palpitations or syncope  Works as Manufacturing engineer at [employer]  No family history of DCM  Dad had "loud murmur"  No connective tissue disease no sarcoid and CXR with no CE or hilar adenopathy    ROS: Denies fever, malais, weight loss, blurry vision, decreased visual acuity, cough, sputum, SOB, hemoptysis, pleuritic pain, palpitaitons, heartburn, abdominal pain, melena, lower extremity edema, claudication, or rash.  All other systems reviewed and negative   General: Affect appropriate Healthy:  appears stated age 76: normal Neck supple with no adenopathy JVP normal no bruits no thyromegaly Lungs clear with no wheezing and good diaphragmatic motion Heart:  S1/S2 no murmur,rub, gallop or click PMI normal Abdomen: benighn, BS positve, no tenderness, no AAA no bruit.  No HSM or HJR Distal pulses intact with no bruits No edema Neuro non-focal Skin warm and dry No muscular weakness  Medications Current Outpatient Prescriptions  Medication Sig Dispense Refill  . acetaminophen (TYLENOL) 500 MG tablet Take 500 mg by mouth every 6 (six) hours as needed for pain.      Marland Kitchen aspirin 81 MG chewable tablet Chew 81 mg by mouth daily.        Marland Kitchen atenolol (TENORMIN) 100 MG tablet Take 50 mg by mouth daily.       Marland Kitchen buPROPion (WELLBUTRIN SR) 150 MG 12 hr tablet Take 150 mg by mouth daily.       . calcium carbonate (OS-CAL) 600 MG TABS Take 600 mg by mouth daily.      . calcium carbonate (TUMS - DOSED IN MG  ELEMENTAL CALCIUM) 500 MG chewable tablet Chew 1 tablet by mouth as needed.       . Cholecalciferol (VITAMIN D-3) 5000 UNITS TABS Take 1 tablet by mouth daily.      Marland Kitchen HYDROcodone-acetaminophen (NORCO) 5-325 MG per tablet 1/2 to 1 tablet every 3 to 4 hours as needed for severe cough or pain  50 tablet  0  . hyoscyamine (LEVSIN SL) 0.125 MG SL tablet Place 1 tablet (0.125 mg total) under the tongue every 4 (four) hours as needed. Stomach pain  100 tablet  99  . ipratropium-albuterol (DUONEB) 0.5-2.5 (3) MG/3ML SOLN Take 3 mLs by nebulization every 4 (four) hours as needed.  360 mL  5  . levothyroxine (SYNTHROID, LEVOTHROID) 100 MCG tablet Take 100 mcg by mouth daily. 1.5 Tue Thur only 1 rest of week      . magnesium gluconate (MAGONATE) 500 MG tablet Take 500 mg by mouth daily.      . Multiple Vitamin (MULTIVITAMIN WITH MINERALS) TABS Take 1 tablet by mouth as needed.       . Omega-3 Fatty Acids (FISH OIL) 1000 MG CAPS Take 1 capsule by mouth daily.      . ondansetron (ZOFRAN) 8 MG tablet Take 1 tablet (8 mg total) by mouth every 8 (eight) hours as needed for nausea or vomiting.  90 tablet  1   No current facility-administered medications for this visit.    Allergies Ace inhibitors; Cephalexin; Codeine; Hydrochlorothiazide; and Sulfa antibiotics  Family History: Family History  Problem Relation Age of Onset  . Colon polyps Mother   . Colon cancer Neg Hx     Social History: History   Social History  . Marital Status: Legally Separated    Spouse Name: N/A    Number of Children: N/A  . Years of Education: N/A   Occupational History  . Not on file.   Social History Main Topics  . Smoking status: Former Smoker -- 1.00 packs/day for 40 years    Types: Cigarettes    Quit date: 04/27/2002  . Smokeless tobacco: Never Used  . Alcohol Use: No  . Drug Use: No  . Sexual Activity: Not on file   Other Topics Concern  . Not on file   Social History Narrative  . No narrative on file     Electrocardiogram: 12.1.13  abd 1.30.15  SR LBBB no changes   Assessment and Plan

## 2013-07-24 NOTE — Patient Instructions (Signed)
Your physician wants you to follow-up in:  [place] will receive a reminder letter in the mail two months in advance. If you don't receive a letter, please call our office to schedule the follow-up appointment. Your physician recommends that you continue on your current medications as directed. Please refer to the Current Medication list given to you today.  Your physician has requested that you have a renal artery duplex. During this test, an ultrasound is used to evaluate blood flow to the kidneys. Allow one hour for this exam. Do not eat after midnight the day before and avoid carbonated beverages. Take your medications as you usually do.  Your physician has requested that you have an echocardiogram. Echocardiography is a painless test that uses sound waves to create images of your heart. It provides your doctor with information about the size and shape of your heart and how well your heart's chambers and valves are working. This procedure takes approximately one hour. There are no restrictions for this procedure.

## 2013-07-24 NOTE — Assessment & Plan Note (Signed)
Can add hydralazine or norvasc in future if needed  F/U primary

## 2013-07-24 NOTE — Assessment & Plan Note (Signed)
Resolved off ACE  F/U renal duplex to r/o RAS

## 2013-07-24 NOTE — Assessment & Plan Note (Signed)
Chronic mild dyspnea and fatigue check Echo  No evidence of high grade AV block  Would not titrate beta blocker any more Yearly ECG

## 2013-08-14 ENCOUNTER — Ambulatory Visit (HOSPITAL_BASED_OUTPATIENT_CLINIC_OR_DEPARTMENT_OTHER): Payer: 59 | Admitting: Radiology

## 2013-08-14 ENCOUNTER — Ambulatory Visit (HOSPITAL_COMMUNITY): Payer: 59 | Attending: Cardiology | Admitting: Cardiology

## 2013-08-14 DIAGNOSIS — Z79899 Other long term (current) drug therapy: Secondary | ICD-10-CM

## 2013-08-14 DIAGNOSIS — R5381 Other malaise: Secondary | ICD-10-CM | POA: Insufficient documentation

## 2013-08-14 DIAGNOSIS — R0602 Shortness of breath: Secondary | ICD-10-CM

## 2013-08-14 DIAGNOSIS — N19 Unspecified kidney failure: Secondary | ICD-10-CM

## 2013-08-14 DIAGNOSIS — I10 Essential (primary) hypertension: Secondary | ICD-10-CM

## 2013-08-14 DIAGNOSIS — I700 Atherosclerosis of aorta: Secondary | ICD-10-CM

## 2013-08-14 DIAGNOSIS — R5383 Other fatigue: Secondary | ICD-10-CM

## 2013-08-14 NOTE — Progress Notes (Signed)
Echocardiogram performed.  

## 2013-08-14 NOTE — Progress Notes (Signed)
Renal artery duplex performed  

## 2013-08-24 ENCOUNTER — Ambulatory Visit (INDEPENDENT_AMBULATORY_CARE_PROVIDER_SITE_OTHER): Payer: 59 | Admitting: Emergency Medicine

## 2013-08-24 ENCOUNTER — Encounter: Payer: Self-pay | Admitting: Emergency Medicine

## 2013-08-24 VITALS — BP 108/80 | HR 84 | Temp 98.2°F | Resp 18 | Ht 64.0 in | Wt 170.0 lb

## 2013-08-24 DIAGNOSIS — E559 Vitamin D deficiency, unspecified: Secondary | ICD-10-CM

## 2013-08-24 DIAGNOSIS — R7309 Other abnormal glucose: Secondary | ICD-10-CM

## 2013-08-24 DIAGNOSIS — I10 Essential (primary) hypertension: Secondary | ICD-10-CM

## 2013-08-24 LAB — CBC WITH DIFFERENTIAL/PLATELET
Basophils Absolute: 0.1 10*3/uL (ref 0.0–0.1)
Basophils Relative: 1 % (ref 0–1)
Eosinophils Absolute: 0.3 10*3/uL (ref 0.0–0.7)
Eosinophils Relative: 5 % (ref 0–5)
HCT: 41.2 % (ref 36.0–46.0)
HEMOGLOBIN: 14 g/dL (ref 12.0–15.0)
LYMPHS ABS: 2.1 10*3/uL (ref 0.7–4.0)
Lymphocytes Relative: 42 % (ref 12–46)
MCH: 30.6 pg (ref 26.0–34.0)
MCHC: 34 g/dL (ref 30.0–36.0)
MCV: 90.2 fL (ref 78.0–100.0)
MONOS PCT: 8 % (ref 3–12)
Monocytes Absolute: 0.4 10*3/uL (ref 0.1–1.0)
NEUTROS PCT: 44 % (ref 43–77)
Neutro Abs: 2.2 10*3/uL (ref 1.7–7.7)
Platelets: 312 10*3/uL (ref 150–400)
RBC: 4.57 MIL/uL (ref 3.87–5.11)
RDW: 13.5 % (ref 11.5–15.5)
WBC: 5 10*3/uL (ref 4.0–10.5)

## 2013-08-24 LAB — BASIC METABOLIC PANEL WITH GFR
BUN: 27 mg/dL — ABNORMAL HIGH (ref 6–23)
CO2: 28 mEq/L (ref 19–32)
Calcium: 9.6 mg/dL (ref 8.4–10.5)
Chloride: 102 mEq/L (ref 96–112)
Creat: 1.04 mg/dL (ref 0.50–1.10)
GFR, Est African American: 67 mL/min
GFR, Est Non African American: 58 mL/min — ABNORMAL LOW
GLUCOSE: 90 mg/dL (ref 70–99)
POTASSIUM: 4.6 meq/L (ref 3.5–5.3)
Sodium: 139 mEq/L (ref 135–145)

## 2013-08-24 LAB — HEMOGLOBIN A1C
Hgb A1c MFr Bld: 5.5 % (ref <5.7)
Mean Plasma Glucose: 111 mg/dL (ref <117)

## 2013-08-24 LAB — MAGNESIUM: MAGNESIUM: 1.7 mg/dL (ref 1.5–2.5)

## 2013-08-24 NOTE — Patient Instructions (Signed)
Hypertension Hypertension is another name for high blood pressure. High blood pressure may mean that your heart needs to work harder to pump blood. Blood pressure consists of two numbers, which includes a higher number over a lower number (example: 110/72). HOME CARE   Make lifestyle changes as told by your doctor. This may include weight loss and exercise.  Take your blood pressure medicine every day.  Limit how much salt you use.  Stop smoking if you smoke.  Do not use drugs.  Talk to your doctor if you are using decongestants or birth control pills. These medicines might make blood pressure higher.  Females should not drink more than 1 alcoholic drink per day. Males should not drink more than 2 alcoholic drinks per day.  See your doctor as told. GET HELP RIGHT AWAY IF:   You have a blood pressure reading with a top number of 180 or higher.  You get a very bad headache.  You get blurred or changing vision.  You feel confused.  You feel weak, numb, or faint.  You get chest or belly (abdominal) pain.  You throw up (vomit).  You cannot breathe very well. MAKE SURE YOU:   Understand these instructions.  Will watch your condition.  Will get help right away if you are not doing well or get worse. Document Released: 09/30/2007 Document Revised: 07/06/2011 Document Reviewed: 09/30/2007 [employer] Patient Information 2014 [employer].  

## 2013-08-24 NOTE — Progress Notes (Signed)
Subjective:    Patient ID: [name], female    DOB: Jul 22, 1950, 63 y.o.   MRN: [id number]  HPI Comments: 63 yo female presents for 3 month F/U for HTN,  Pre-Dm, D. deficient SHe does not eat meat. She had renal doppler that was neg. Echo was WNL. She is keeping active but no routine cardio. Her cholesterol has not been elevated in the past year. Her BP is good at home. HGBA1C      5.7   04/25/2013 CREATININE     1.14   07/04/2013 BUN              17   07/04/2013 NA              141   07/04/2013 K               3.8   07/04/2013 CL              105   07/04/2013 CO2              30   07/04/2013 WBC      5.5   05/29/2013 HGB     13.1   05/29/2013 HCT     38.8   05/29/2013 MCV     91.9   05/29/2013 PLT      331   05/29/2013      Medication List       This list is accurate as of: 08/24/13  9:03 AM.  Always use your most recent med list.               acetaminophen 500 MG tablet  Commonly known as:  TYLENOL  Take 500 mg by mouth every 6 (six) hours as needed for pain.     aspirin 81 MG chewable tablet  Chew 81 mg by mouth daily.     atenolol 100 MG tablet  Commonly known as:  TENORMIN  Take 50 mg by mouth daily.     buPROPion 150 MG 12 hr tablet  Commonly known as:  WELLBUTRIN SR  Take 150 mg by mouth daily.     calcium carbonate 500 MG chewable tablet  Commonly known as:  TUMS - dosed in mg elemental calcium  Chew 1 tablet by mouth as needed.     calcium carbonate 600 MG Tabs tablet  Commonly known as:  OS-CAL  Take 600 mg by mouth daily.     hyoscyamine 0.125 MG SL tablet  Commonly known as:  LEVSIN SL  Place 1 tablet (0.125 mg total) under the tongue every 4 (four) hours as needed. Stomach pain     levothyroxine 100 MCG tablet  Commonly known as:  SYNTHROID, LEVOTHROID  Take 100 mcg by mouth daily. 1.5 Tue Thur only 1 rest of week     Magnesium 250 MG Tabs  Take 250 mg by mouth daily.     ondansetron 8 MG tablet  Commonly known as:  ZOFRAN  Take 1 tablet (8 mg  total) by mouth every 8 (eight) hours as needed for nausea or vomiting.     Vitamin D-3 5000 UNITS Tabs  Take 1 tablet by mouth daily.       Allergies  Allergen Reactions  . Ace Inhibitors     Acute renal failure  . Cephalexin Anaphylaxis  . Codeine Nausea Only  . Hydrochlorothiazide   . Sulfa Antibiotics Rash   Past Medical History  Diagnosis Date  . Hypothyroid   .  Hypertension   . ADD (attention deficit disorder with hyperactivity)   . OCD (obsessive compulsive disorder)   . IBS (irritable bowel syndrome)   . Diverticulosis   . Irregular heart rate     left bundle branch block  . Helicobacter pylori (H. pylori)   . History of IBS   . Depression   . Prediabetes   . Migraines   . Vitamin D deficiency      Review of Systems  All other systems reviewed and are negative.  BP 108/80  Pulse 84  Temp(Src) 98.2 F (36.8 C) (Temporal)  Resp 18  Ht 5\' 4"  (1.626 m)  Wt 170 lb (77.111 kg)  BMI 29.17 kg/m2     Objective:   Physical Exam  Nursing note and vitals reviewed. Constitutional: She is oriented to person, place, and time. She appears well-developed and well-nourished. No distress.  overweight  HENT:  Head: Normocephalic and atraumatic.  Right Ear: External ear normal.  Left Ear: External ear normal.  Nose: Nose normal.  Eyes: Conjunctivae and EOM are normal.  Neck: Normal range of motion. Neck supple. No JVD present. No thyromegaly present.  Cardiovascular: Normal rate, regular rhythm, normal heart sounds and intact distal pulses.   Pulmonary/Chest: Effort normal and breath sounds normal.  Abdominal: Soft. Bowel sounds are normal. She exhibits no distension and no mass. There is no tenderness. There is no rebound and no guarding.  Musculoskeletal: Normal range of motion. She exhibits no edema and no tenderness.  Lymphadenopathy:    She has no cervical adenopathy.  Neurological: She is alert and oriented to person, place, and time. No cranial nerve  deficit.  Skin: Skin is warm and dry. No rash noted. No erythema. No pallor.  Psychiatric: She has a normal mood and affect. Her behavior is normal. Judgment and thought content normal.          Assessment & Plan:  1.  3 month F/U for HTN, Pre-Dm, D. Deficient. Needs healthy diet, cardio QD and obtain healthy weight. Check Labs, Check BP if >130/80 call office

## 2013-08-25 LAB — VITAMIN D 25 HYDROXY (VIT D DEFICIENCY, FRACTURES): Vit D, 25-Hydroxy: 85 ng/mL (ref 30–89)

## 2013-10-10 ENCOUNTER — Encounter: Payer: Self-pay | Admitting: Internal Medicine

## 2013-10-11 ENCOUNTER — Other Ambulatory Visit: Payer: Self-pay

## 2013-10-11 DIAGNOSIS — Z1231 Encounter for screening mammogram for malignant neoplasm of breast: Secondary | ICD-10-CM

## 2013-10-24 ENCOUNTER — Other Ambulatory Visit: Payer: Self-pay | Admitting: Internal Medicine

## 2013-10-24 ENCOUNTER — Encounter: Payer: Self-pay | Admitting: Internal Medicine

## 2013-10-30 ENCOUNTER — Other Ambulatory Visit: Payer: Self-pay | Admitting: Internal Medicine

## 2013-11-09 ENCOUNTER — Ambulatory Visit
Admission: RE | Admit: 2013-11-09 | Discharge: 2013-11-09 | Disposition: A | Discharge status: Home / Self Care | Payer: 59 | Source: Ambulatory Visit

## 2013-11-09 DIAGNOSIS — Z1231 Encounter for screening mammogram for malignant neoplasm of breast: Secondary | ICD-10-CM

## 2013-11-11 ENCOUNTER — Other Ambulatory Visit: Payer: Self-pay | Admitting: Internal Medicine

## 2013-11-15 ENCOUNTER — Encounter: Payer: Self-pay | Admitting: Internal Medicine

## 2013-11-15 ENCOUNTER — Ambulatory Visit (INDEPENDENT_AMBULATORY_CARE_PROVIDER_SITE_OTHER): Payer: 59 | Admitting: Internal Medicine

## 2013-11-15 VITALS — BP 104/68 | HR 76 | Temp 97.9°F | Resp 16 | Ht 63.5 in | Wt 170.4 lb

## 2013-11-15 DIAGNOSIS — R740 Nonspecific elevation of levels of transaminase and lactic acid dehydrogenase [LDH]: Secondary | ICD-10-CM

## 2013-11-15 DIAGNOSIS — E559 Vitamin D deficiency, unspecified: Secondary | ICD-10-CM

## 2013-11-15 DIAGNOSIS — Z1212 Encounter for screening for malignant neoplasm of rectum: Secondary | ICD-10-CM

## 2013-11-15 DIAGNOSIS — Z0000 Encounter for general adult medical examination without abnormal findings: Secondary | ICD-10-CM

## 2013-11-15 DIAGNOSIS — I10 Essential (primary) hypertension: Secondary | ICD-10-CM

## 2013-11-15 DIAGNOSIS — Z113 Encounter for screening for infections with a predominantly sexual mode of transmission: Secondary | ICD-10-CM

## 2013-11-15 DIAGNOSIS — R7401 Elevation of levels of liver transaminase levels: Secondary | ICD-10-CM

## 2013-11-15 LAB — CBC WITH DIFFERENTIAL/PLATELET
BASOS PCT: 1 % (ref 0–1)
Basophils Absolute: 0.1 10*3/uL (ref 0.0–0.1)
EOS ABS: 0.2 10*3/uL (ref 0.0–0.7)
EOS PCT: 4 % (ref 0–5)
HCT: 40 % (ref 36.0–46.0)
Hemoglobin: 14 g/dL (ref 12.0–15.0)
Lymphocytes Relative: 33 % (ref 12–46)
Lymphs Abs: 1.7 10*3/uL (ref 0.7–4.0)
MCH: 31.4 pg (ref 26.0–34.0)
MCHC: 35 g/dL (ref 30.0–36.0)
MCV: 89.7 fL (ref 78.0–100.0)
MONOS PCT: 5 % (ref 3–12)
Monocytes Absolute: 0.3 10*3/uL (ref 0.1–1.0)
NEUTROS PCT: 57 % (ref 43–77)
Neutro Abs: 3 10*3/uL (ref 1.7–7.7)
Platelets: 337 10*3/uL (ref 150–400)
RBC: 4.46 MIL/uL (ref 3.87–5.11)
RDW: 14 % (ref 11.5–15.5)
WBC: 5.2 10*3/uL (ref 4.0–10.5)

## 2013-11-15 NOTE — Patient Instructions (Addendum)
Recommend the book "The END of DIETING" by Dr Baker Janus   and the book "The END of DIABETES " by Dr Excell Seltzer  At [email or web address] - get book & Audio CD's      Being diabetic has a  300% increased risk for heart attack, stroke, cancer, and alzheimer- type vascular dementia. It is very important that you work harder with diet by avoiding all foods that are white except chicken & fish. Avoid white rice (brown & wild rice is OK), white potatoes (sweetpotatoes in moderation is OK), White bread or wheat bread or anything made out of white flour like bagels, donuts, rolls, buns, biscuits, cakes, pastries, cookies, pizza crust, and pasta (made from white flour & egg whites) - vegetarian pasta or spinach or wheat pasta is OK. Multigrain breads like Arnold's or [employer], or multigrain sandwich thins or flatbreads.  Diet, exercise and weight loss can reverse and cure diabetes in the early stages.  Diet, exercise and weight loss is very important in the control and prevention of complications of diabetes which affects every system in your body, ie. Brain - dementia/stroke, eyes - glaucoma/blindness, heart - heart attack/heart failure, kidneys - dialysis, stomach - gastric paralysis, intestines - malabsorption, nerves - severe painful neuritis, circulation - gangrene & loss of a leg(s), and finally cancer and Alzheimers.    I recommend avoid fried & greasy foods,  sweets/candy, white rice (brown or wild rice or Quinoa is OK), white potatoes (sweet potatoes are OK) - anything made from white flour - bagels, doughnuts, rolls, buns, biscuits,white and wheat breads, pizza crust and traditional pasta made of white flour & egg white(vegetarian pasta or spinach or wheat pasta is OK).  Multi-grain bread is OK - like multi-grain flat bread or sandwich thins. Avoid alcohol in excess. Exercise is also important.    Eat all the vegetables you want - avoid meat, especially red meat and dairy - especially cheese.  Cheese  is the most concentrated form of trans-fats which is the worst thing to clog up our arteries. Veggie cheese is OK which can be found in the fresh produce section at [facility] or [employer] or [employer]  +++++++++++++++++++++++++++++++++++++++++++  Preventive Care for Adults A healthy lifestyle and preventive care can promote health and wellness. Preventive health guidelines for women include the following key practices.  A routine yearly physical is a good way to check with your health care provider about your health and preventive screening. It is a chance to share any concerns and updates on your health and to receive a thorough exam.  Visit your dentist for a routine exam and preventive care every 6 months. Brush your teeth twice a day and floss once a day. Good oral hygiene prevents tooth decay and gum disease.  The frequency of eye exams is based on your age, health, family medical history, use of contact lenses, and other factors. Follow your health care provider's recommendations for frequency of eye exams.  Eat a healthy diet. Foods like vegetables, fruits, whole grains, low-fat dairy products, and lean protein foods contain the nutrients you need without too many calories. Decrease your intake of foods high in solid fats, added sugars, and salt. Eat the right amount of calories for you.Get information about a proper diet from your health care provider, if necessary.  Regular physical exercise is one of the most important things you can do for your health. Most adults should get at least 150 minutes of moderate-intensity exercise (any activity  that increases your heart rate and causes you to sweat) each week. In addition, most adults need muscle-strengthening exercises on 2 or more days a week.  Maintain a healthy weight. The body mass index (BMI) is a screening tool to identify possible weight problems. It provides an estimate of body fat based on height and weight. Your health care  provider can find your BMI, and can help you achieve or maintain a healthy weight.For adults 20 years and older:  A BMI below 18.5 is considered underweight.  A BMI of 18.5 to 24.9 is normal.  A BMI of 25 to 29.9 is considered overweight.  A BMI of 30 and above is considered obese.  Maintain normal blood lipids and cholesterol levels by exercising and minimizing your intake of saturated fat. Eat a balanced diet with plenty of fruit and vegetables. Blood tests for lipids and cholesterol should begin at age 82 and be repeated every 5 years. If your lipid or cholesterol levels are high, you are over 50, or you are at high risk for heart disease, you may need your cholesterol levels checked more frequently.Ongoing high lipid and cholesterol levels should be treated with medicines if diet and exercise are not working.  If you smoke, find out from your health care provider how to quit. If you do not use tobacco, do not start.  Lung cancer screening is recommended for adults aged 56-80 years who are at high risk for developing lung cancer because of a history of smoking. A yearly low-dose CT scan of the lungs is recommended for people who have at least a 30-pack-year history of smoking and are a current smoker or have quit within the past 15 years. A pack year of smoking is smoking an average of 1 pack of cigarettes a day for 1 year (for example: 1 pack a day for 30 years or 2 packs a day for 15 years). Yearly screening should continue until the smoker has stopped smoking for at least 15 years. Yearly screening should be stopped for people who develop a health problem that would prevent them from having lung cancer treatment.  If you are pregnant, do not drink alcohol. If you are breastfeeding, be very cautious about drinking alcohol. If you are not pregnant and choose to drink alcohol, do not have more than 1 drink per day. One drink is considered to be 12 ounces (355 mL) of beer, 5 ounces (148 mL) of  wine, or 1.5 ounces (44 mL) of liquor.  Avoid use of street drugs. Do not share needles with anyone. Ask for help if you need support or instructions about stopping the use of drugs.  High blood pressure causes heart disease and increases the risk of stroke. Your blood pressure should be checked at least every 1 to 2 years. Ongoing high blood pressure should be treated with medicines if weight loss and exercise do not work.  If you are 2-29 years old, ask your health care provider if you should take aspirin to prevent strokes.  Diabetes screening involves taking a blood sample to check your fasting blood sugar level. This should be done once every 3 years, after age 82, if you are within normal weight and without risk factors for diabetes. Testing should be considered at a younger age or be carried out more frequently if you are overweight and have at least 1 risk factor for diabetes.  Breast cancer screening is essential preventive care for women. You should practice "breast self-awareness." This means  understanding the normal appearance and feel of your breasts and may include breast self-examination. Any changes detected, no matter how small, should be reported to a health care provider. Women in their 66s and 30s should have a clinical breast exam (CBE) by a health care provider as part of a regular health exam every 1 to 3 years. After age 54, women should have a CBE every year. Starting at age 66, women should consider having a mammogram (breast X-ray test) every year. Women who have a family history of breast cancer should talk to their health care provider about genetic screening. Women at a high risk of breast cancer should talk to their health care providers about having an MRI and a mammogram every year.  Breast cancer gene (BRCA)-related cancer risk assessment is recommended for women who have family members with BRCA-related cancers. BRCA-related cancers include breast, ovarian, tubal, and  peritoneal cancers. Having family members with these cancers may be associated with an increased risk for harmful changes (mutations) in the breast cancer genes BRCA1 and BRCA2. Results of the assessment will determine the need for genetic counseling and BRCA1 and BRCA2 testing.  Routine pelvic exams to screen for cancer are no longer recommended for nonpregnant women who are considered low risk for cancer of the pelvic organs (ovaries, uterus, and vagina) and who do not have symptoms. Ask your health care provider if a screening pelvic exam is right for you.  If you have had past treatment for cervical cancer or a condition that could lead to cancer, you need Pap tests and screening for cancer for at least 20 years after your treatment. If Pap tests have been discontinued, your risk factors (such as having a new sexual partner) need to be reassessed to determine if screening should be resumed. Some women have medical problems that increase the chance of getting cervical cancer. In these cases, your health care provider may recommend more frequent screening and Pap tests.  The HPV test is an additional test that may be used for cervical cancer screening. The HPV test looks for the virus that can cause the cell changes on the cervix. The cells collected during the Pap test can be tested for HPV. The HPV test could be used to screen women aged 30 years and older, and should be used in women of any age who have unclear Pap test results. After the age of 42, women should have HPV testing at the same frequency as a Pap test.  Colorectal cancer can be detected and often prevented. Most routine colorectal cancer screening begins at the age of 57 years and continues through age 32 years. However, your health care provider may recommend screening at an earlier age if you have risk factors for colon cancer. On a yearly basis, your health care provider may provide home test kits to check for hidden blood in the stool.  Use of a small camera at the end of a tube, to directly examine the colon (sigmoidoscopy or colonoscopy), can detect the earliest forms of colorectal cancer. Talk to your health care provider about this at age 68, when routine screening begins. Direct exam of the colon should be repeated every 5-10 years through age 1 years, unless early forms of pre-cancerous polyps or small growths are found.  People who are at an increased risk for hepatitis B should be screened for this virus. You are considered at high risk for hepatitis B if:  You were born in a country where hepatitis  B occurs often. Talk with your health care provider about which countries are considered high risk.  Your parents were born in a high-risk country and you have not received a shot to protect against hepatitis B (hepatitis B vaccine).  You have HIV or AIDS.  You use needles to inject street drugs.  You live with, or have sex with, someone who has Hepatitis B.  You get hemodialysis treatment.  You take certain medicines for conditions like cancer, organ transplantation, and autoimmune conditions.  Hepatitis C blood testing is recommended for all people born from [birth year] through 1965 and any individual with known risks for hepatitis C.  Practice safe sex. Use condoms and avoid high-risk sexual practices to reduce the spread of sexually transmitted infections (STIs). STIs include gonorrhea, chlamydia, syphilis, trichomonas, herpes, HPV, and human immunodeficiency virus (HIV). Herpes, HIV, and HPV are viral illnesses that have no cure. They can result in disability, cancer, and death.  You should be screened for sexually transmitted illnesses (STIs) including gonorrhea and chlamydia if:  You are sexually active and are younger than 24 years.  You are older than 24 years and your health care provider tells you that you are at risk for this type of infection.  Your sexual activity has changed since you were last screened and  you are at an increased risk for chlamydia or gonorrhea. Ask your health care provider if you are at risk.  If you are at risk of being infected with HIV, it is recommended that you take a prescription medicine daily to prevent HIV infection. This is called preexposure prophylaxis (PrEP). You are considered at risk if:  You are a heterosexual woman, are sexually active, and are at increased risk for HIV infection.  You take drugs by injection.  You are sexually active with a partner who has HIV.  Talk with your health care provider about whether you are at high risk of being infected with HIV. If you choose to begin PrEP, you should first be tested for HIV. You should then be tested every 3 months for as long as you are taking PrEP.  Osteoporosis is a disease in which the bones lose minerals and strength with aging. This can result in serious bone fractures or breaks. The risk of osteoporosis can be identified using a bone density scan. Women ages 33 years and over and women at risk for fractures or osteoporosis should discuss screening with their health care providers. Ask your health care provider whether you should take a calcium supplement or vitamin D to reduce the rate of osteoporosis.  Menopause can be associated with physical symptoms and risks. Hormone replacement therapy is available to decrease symptoms and risks. You should talk to your health care provider about whether hormone replacement therapy is right for you.  Use sunscreen. Apply sunscreen liberally and repeatedly throughout the day. You should seek shade when your shadow is shorter than you. Protect yourself by wearing long sleeves, pants, a wide-brimmed hat, and sunglasses year round, whenever you are outdoors.  Once a month, do a whole body skin exam, using a mirror to look at the skin on your back. Tell your health care provider of new moles, moles that have irregular borders, moles that are larger than a pencil eraser, or  moles that have changed in shape or color.  Stay current with required vaccines (immunizations).  Influenza vaccine. All adults should be immunized every year.  Tetanus, diphtheria, and acellular pertussis (Td, Tdap) vaccine. Pregnant women  should receive 1 dose of Tdap vaccine during each pregnancy. The dose should be obtained regardless of the length of time since the last dose. Immunization is preferred during the 27th-36th week of gestation. An adult who has not previously received Tdap or who does not know her vaccine status should receive 1 dose of Tdap. This initial dose should be followed by tetanus and diphtheria toxoids (Td) booster doses every 10 years. Adults with an unknown or incomplete history of completing a 3-dose immunization series with Td-containing vaccines should begin or complete a primary immunization series including a Tdap dose. Adults should receive a Td booster every 10 years.  Varicella vaccine. An adult without evidence of immunity to varicella should receive 2 doses or a second dose if she has previously received 1 dose. Pregnant females who do not have evidence of immunity should receive the first dose after pregnancy. This first dose should be obtained before leaving the health care facility. The second dose should be obtained 4-8 weeks after the first dose.  Human papillomavirus (HPV) vaccine. Females aged 13-26 years who have not received the vaccine previously should obtain the 3-dose series. The vaccine is not recommended for use in pregnant females. However, pregnancy testing is not needed before receiving a dose. If a female is found to be pregnant after receiving a dose, no treatment is needed. In that case, the remaining doses should be delayed until after the pregnancy. Immunization is recommended for any person with an immunocompromised condition through the age of 60 years if she did not get any or all doses earlier. During the 3-dose series, the second dose  should be obtained 4-8 weeks after the first dose. The third dose should be obtained 24 weeks after the first dose and 16 weeks after the second dose.  Zoster vaccine. One dose is recommended for adults aged 16 years or older unless certain conditions are present.  Measles, mumps, and rubella (MMR) vaccine. Adults born before 25 generally are considered immune to measles and mumps. Adults born in 1 or later should have 1 or more doses of MMR vaccine unless there is a contraindication to the vaccine or there is laboratory evidence of immunity to each of the three diseases. A routine second dose of MMR vaccine should be obtained at least 28 days after the first dose for students attending postsecondary schools, health care workers, or international travelers. People who received inactivated measles vaccine or an unknown type of measles vaccine during 1963-1967 should receive 2 doses of MMR vaccine. People who received inactivated mumps vaccine or an unknown type of mumps vaccine before 1979 and are at high risk for mumps infection should consider immunization with 2 doses of MMR vaccine. For females of childbearing age, rubella immunity should be determined. If there is no evidence of immunity, females who are not pregnant should be vaccinated. If there is no evidence of immunity, females who are pregnant should delay immunization until after pregnancy. Unvaccinated health care workers born before 18 who lack laboratory evidence of measles, mumps, or rubella immunity or laboratory confirmation of disease should consider measles and mumps immunization with 2 doses of MMR vaccine or rubella immunization with 1 dose of MMR vaccine.  Pneumococcal 13-valent conjugate (PCV13) vaccine. When indicated, a person who is uncertain of her immunization history and has no record of immunization should receive the PCV13 vaccine. An adult aged 64 years or older who has certain medical conditions and has not been  previously immunized should receive 1  dose of PCV13 vaccine. This PCV13 should be followed with a dose of pneumococcal polysaccharide (PPSV23) vaccine. The PPSV23 vaccine dose should be obtained at least 8 weeks after the dose of PCV13 vaccine. An adult aged 91 years or older who has certain medical conditions and previously received 1 or more doses of PPSV23 vaccine should receive 1 dose of PCV13. The PCV13 vaccine dose should be obtained 1 or more years after the last PPSV23 vaccine dose.  Pneumococcal polysaccharide (PPSV23) vaccine. When PCV13 is also indicated, PCV13 should be obtained first. All adults aged 59 years and older should be immunized. An adult younger than age 65 years who has certain medical conditions should be immunized. Any person who resides in a nursing home or long-term care facility should be immunized. An adult smoker should be immunized. People with an immunocompromised condition and certain other conditions should receive both PCV13 and PPSV23 vaccines. People with human immunodeficiency virus (HIV) infection should be immunized as soon as possible after diagnosis. Immunization during chemotherapy or radiation therapy should be avoided. Routine use of PPSV23 vaccine is not recommended for American Indians, [place] Natives, or people younger than 65 years unless there are medical conditions that require PPSV23 vaccine. When indicated, people who have unknown immunization and have no record of immunization should receive PPSV23 vaccine. One-time revaccination 5 years after the first dose of PPSV23 is recommended for people aged 19-64 years who have chronic kidney failure, nephrotic syndrome, asplenia, or immunocompromised conditions. People who received 1-2 doses of PPSV23 before age 47 years should receive another dose of PPSV23 vaccine at age 55 years or later if at least 5 years have passed since the previous dose. Doses of PPSV23 are not needed for people immunized with PPSV23 at or  after age 64 years.  Meningococcal vaccine. Adults with asplenia or persistent complement component deficiencies should receive 2 doses of quadrivalent meningococcal conjugate (MenACWY-D) vaccine. The doses should be obtained at least 2 months apart. Microbiologists working with certain meningococcal bacteria, [place] recruits, people at risk during an outbreak, and people who travel to or live in countries with a high rate of meningitis should be immunized. A first-year college student up through age 74 years who is living in a residence hall should receive a dose if she did not receive a dose on or after her 16th birthday. Adults who have certain high-risk conditions should receive one or more doses of vaccine.  Hepatitis A vaccine. Adults who wish to be protected from this disease, have certain high-risk conditions, work with hepatitis A-infected animals, work in hepatitis A research labs, or travel to or work in countries with a high rate of hepatitis A should be immunized. Adults who were previously unvaccinated and who anticipate close contact with an international adoptee during the first 60 days after arrival in the [place] from a country with a high rate of hepatitis A should be immunized.  Hepatitis B vaccine. Adults who wish to be protected from this disease, have certain high-risk conditions, may be exposed to blood or other infectious body fluids, are household contacts or sex partners of hepatitis B positive people, are clients or workers in certain care facilities, or travel to or work in countries with a high rate of hepatitis B should be immunized.  Haemophilus influenzae type b (Hib) vaccine. A previously unvaccinated person with asplenia or sickle cell disease or having a scheduled splenectomy should receive 1 dose of Hib vaccine. Regardless of previous immunization, a recipient of a  hematopoietic stem cell transplant should receive a 3-dose series 6-12 months after her successful  transplant. Hib vaccine is not recommended for adults with HIV infection. Preventive Services / Frequency Ages 68 to 64years  Blood pressure check.** / Every 1 to 2 years.  Lipid and cholesterol check.** / Every 5 years beginning at age 39 years.  Lung cancer screening. / Every year if you are aged 63-80 years and have a 30-pack-year history of smoking and currently smoke or have quit within the past 15 years. Yearly screening is stopped once you have quit smoking for at least 15 years or develop a health problem that would prevent you from having lung cancer treatment.  Clinical breast exam.** / Every year after age 2 years.  BRCA-related cancer risk assessment.** / For women who have family members with a BRCA-related cancer (breast, ovarian, tubal, or peritoneal cancers).  Mammogram.** / Every year beginning at age 92 years and continuing for as long as you are in good health. Consult with your health care provider.  Pap test.** / Every 3 years starting at age 36 years through age 27 or 61 years with a history of 3 consecutive normal Pap tests.  HPV screening.** / Every 3 years from ages 58 years through ages 84 to 46 years with a history of 3 consecutive normal Pap tests.  Fecal occult blood test (FOBT) of stool. / Every year beginning at age 72 years and continuing until age 41 years. You may not need to do this test if you get a colonoscopy every 10 years.  Flexible sigmoidoscopy or colonoscopy.** / Every 5 years for a flexible sigmoidoscopy or every 10 years for a colonoscopy beginning at age 69 years and continuing until age 87 years.  Hepatitis C blood test.** / For all people born from [birth year] through 1964 and any individual with known risks for hepatitis C.  Skin self-exam. / Monthly.  Influenza vaccine. / Every year.  Tetanus, diphtheria, and acellular pertussis (Tdap/Td) vaccine.** / Consult your health care provider. Pregnant women should receive 1 dose of Tdap vaccine  during each pregnancy. 1 dose of Td every 10 years.  Varicella vaccine.** / Consult your health care provider. Pregnant females who do not have evidence of immunity should receive the first dose after pregnancy.  Zoster vaccine.** / 1 dose for adults aged 58 years or older.  Measles, mumps, rubella (MMR) vaccine.** / You need at least 1 dose of MMR if you were born in 1957 or later. You may also need a 2nd dose. For females of childbearing age, rubella immunity should be determined. If there is no evidence of immunity, females who are not pregnant should be vaccinated. If there is no evidence of immunity, females who are pregnant should delay immunization until after pregnancy.  Pneumococcal 13-valent conjugate (PCV13) vaccine.** / Consult your health care provider.  Pneumococcal polysaccharide (PPSV23) vaccine.** / 1 to 2 doses if you smoke cigarettes or if you have certain conditions.  Meningococcal vaccine.** / Consult your health care provider.  Hepatitis A vaccine.** / Consult your health care provider.  Hepatitis B vaccine.** / Consult your health care provider.  Haemophilus influenzae type b (Hib) vaccine.** / Consult your health care provider. Ages 38 years and over  Blood pressure check.** / Every 1 to 2 years.  Lipid and cholesterol check.** / Every 5 years beginning at age 29 years.  Lung cancer screening. / Every year if you are aged 29-80 years and have a 30-pack-year history of smoking  and currently smoke or have quit within the past 15 years. Yearly screening is stopped once you have quit smoking for at least 15 years or develop a health problem that would prevent you from having lung cancer treatment.  Clinical breast exam.** / Every year after age 55 years.  BRCA-related cancer risk assessment.** / For women who have family members with a BRCA-related cancer (breast, ovarian, tubal, or peritoneal cancers).  Mammogram.** / Every year beginning at age 49 years and  continuing for as long as you are in good health. Consult with your health care provider.  Pap test.** / Every 3 years starting at age 45 years through age 39 or 103 years with 3 consecutive normal Pap tests. Testing can be stopped between 65 and 70 years with 3 consecutive normal Pap tests and no abnormal Pap or HPV tests in the past 10 years.  HPV screening.** / Every 3 years from ages 59 years through ages 42 or 84 years with a history of 3 consecutive normal Pap tests. Testing can be stopped between 65 and 70 years with 3 consecutive normal Pap tests and no abnormal Pap or HPV tests in the past 10 years.  Fecal occult blood test (FOBT) of stool. / Every year beginning at age 31 years and continuing until age 68 years. You may not need to do this test if you get a colonoscopy every 10 years.  Flexible sigmoidoscopy or colonoscopy.** / Every 5 years for a flexible sigmoidoscopy or every 10 years for a colonoscopy beginning at age 50 years and continuing until age 55 years.  Hepatitis C blood test.** / For all people born from [birth year] through 1964 and any individual with known risks for hepatitis C.  Osteoporosis screening.** / A one-time screening for women ages 98 years and over and women at risk for fractures or osteoporosis.  Skin self-exam. / Monthly.  Influenza vaccine. / Every year.  Tetanus, diphtheria, and acellular pertussis (Tdap/Td) vaccine.** / 1 dose of Td every 10 years.  Varicella vaccine.** / Consult your health care provider.  Zoster vaccine.** / 1 dose for adults aged 78 years or older.  Pneumococcal 13-valent conjugate (PCV13) vaccine.** / Consult your health care provider.  Pneumococcal polysaccharide (PPSV23) vaccine.** / 1 dose for all adults aged 77 years and older.  Meningococcal vaccine.** / Consult your health care provider.  Hepatitis A vaccine.** / Consult your health care provider.  Hepatitis B vaccine.** / Consult your health care provider.  Haemophilus  influenzae type b (Hib) vaccine.** / Consult your health care provider. ** Family history and personal history of risk and conditions may change your health care provider's recommendations. Document Released: 06/09/2001 Document Revised: 04/18/2013 Document Reviewed: 09/08/2010 [facility] Patient Information 2015 [place], [place]. This information is not intended to replace advice given to you by your health care provider. Make sure you discuss any questions you have with your health care provider.

## 2013-11-15 NOTE — Progress Notes (Signed)
Patient ID: [name], female   DOB: [birth year], 63 y.o.   MRN: [id number]   Annual Screening Comprehensive Examination  This very nice 63 y.o.DWF presents for complete physical.  Patient has been followed for HTN,  Prediabetes, Hyperlipidemia, and Vitamin D Deficiency.    HTN predates since 2003. Patient's BP has been controlled and today's BP: 104/68 mmHg. Patient denies any cardiac symptoms as chest pain, palpitations, shortness of breath, dizziness or ankle swelling.   Patient's hyperlipidemia is controlled with diet and medications. Patient denies myalgias or other medication SE's. Last lipids were at goal as below. Lab Results  Component Value Date   CHOL 168 04/25/2013   HDL 57 04/25/2013   LDLCALC 94 04/25/2013   TRIG 84 04/25/2013   CHOLHDL 2.9 04/25/2013    Patient has prediabetes predating since Feb 2011 with A1c 5.7% and the 6.0% in Aug 2012 and last A1c was 5.5% in Apr 2015. Patient denies reactive hypoglycemic symptoms, visual blurring, diabetic polys, or paresthesias.    Finally, patient has history of Vitamin D Deficiency and last Vitamin D was 85 in Apr 2015.  Medication Sig  . acetaminophen 500 MG tablet Take 500 mg  every 6 hours as needed   . aspirin 81 MG chewable tablet Chew 81 mg by mouth daily.    Marland Kitchen atenolol  100 MG tablet Take 50 mg by mouth daily.   Marland Kitchen buPROPion SR 150 MG 12 hr  Take 150 mg by mouth daily.   . OS-CAL 600 MG TABS Take 600 mg by mouth daily.  . calcium carbonate (TUMS)500 MG  Chew 1 tablet as needed. Uses Rolaids  . VITAMIN D 5000 UNITS Take 1 tablet by mouth daily.  . hyoscyamine  0.125 MG SL tablet  1 tablet  under  tongue every 4  hours as needed  . levothyroxine  100 MCG tablet Take 100 mcg by mouth daily. 1.5 Tue Thur only 1 rest of week  . Magnesium 250 MG TABS Take 250 mg by mouth daily.  . ondansetron  8 MG tablet TAKE 1 TABLET 3 TIMES A DAY AS NEEDED    Allergies  Allergen Reactions  . Ace Inhibitors     Acute renal failure  .  Cephalexin Anaphylaxis  . Codeine Nausea Only  . Hydrochlorothiazide   . Sulfa Antibiotics Rash   Past Medical History  Diagnosis Date  . Hypothyroid   . Hypertension   . ADD (attention deficit disorder with hyperactivity)   . OCD (obsessive compulsive disorder)   . IBS (irritable bowel syndrome)   . Diverticulosis   . Irregular heart rate     left bundle branch block  . Helicobacter pylori (H. pylori)   . History of IBS   . Depression   . Prediabetes   . Migraines   . Vitamin D deficiency    Past Surgical History  Procedure Laterality Date  . Tonsillectomy    . Oophorectomy    . Bunionectomy      Bil  . Colon surgery      colonoscopy  . Hemorrhoid surgery    . Breast enhancement surgery     Family History  Problem Relation Age of Onset  . Colon polyps Mother   . Colon cancer Neg Hx    History  Substance Use Topics  . Smoking status: Former Smoker -- 1.00 packs/day for 40 years    Types: Cigarettes    Quit date: 04/27/2002  . Smokeless tobacco: Never Used  .  Alcohol Use: Yes     Comment: ocassional    ROS Constitutional: Denies fever, chills, weight loss/gain, headaches, insomnia, fatigue, night sweats, and change in appetite. Eyes: Denies redness, blurred vision, diplopia, discharge, itchy, watery eyes.  ENT: Denies discharge, congestion, post nasal drip, epistaxis, sore throat, earache, hearing loss, dental pain, Tinnitus, Vertigo, Sinus pain, snoring.  Cardio: Denies chest pain, palpitations, irregular heartbeat, syncope, dyspnea, diaphoresis, orthopnea, PND, claudication, edema Respiratory: denies cough, dyspnea, DOE, pleurisy, hoarseness, laryngitis, wheezing.  Gastrointestinal: Denies dysphagia, heartburn, reflux, water brash, pain, cramps, nausea, vomiting, bloating, diarrhea, constipation, hematemesis, melena, hematochezia, jaundice, hemorrhoids Genitourinary: Denies dysuria, frequency, urgency, nocturia, hesitancy, discharge, hematuria, flank  pain Breast: Breast lumps, nipple discharge, bleeding.  Musculoskeletal: Denies arthralgia, myalgia, stiffness, Jt. Swelling, pain, limp, and strain/sprain. Denies falls. Skin: Denies puritis, rash, hives, warts, acne, eczema, changing in skin lesion Neuro: No weakness, tremor, incoordination, spasms, paresthesia, pain Psychiatric: Denies confusion, memory loss, sensory loss. Denies Depression. Endocrine: Denies change in weight, skin, hair change, nocturia, and paresthesia, diabetic polys, visual blurring, hyper / hypo glycemic episodes.  Heme/Lymph: No excessive bleeding, bruising, enlarged lymph nodes.  Physical Exam  BP 104/68  P 76  T 97.9 F   Resp 16  Ht 5' 3.5"   Wt 170 lb 6.4 oz   BMI 29.71 kg/m2  General Appearance: Well nourished and in no apparent distress. Eyes: PERRLA, EOMs, conjunctiva no swelling or erythema, normal fundi and vessels. Sinuses: No frontal/maxillary tenderness ENT/Mouth: EACs patent / TMs  nl. Nares clear without erythema, swelling, mucoid exudates. Oral hygiene is good. No erythema, swelling, or exudate. Tongue normal, non-obstructing. Tonsils not swollen or erythematous. Hearing normal.  Neck: Supple, thyroid normal. No bruits, nodes or JVD. Respiratory: Respiratory effort normal.  BS equal and clear bilateral without rales, rhonci, wheezing or stridor. Cardio: Heart sounds are normal with regular rate and rhythm and no murmurs, rubs or gallops. Peripheral pulses are normal and equal bilaterally without edema. No aortic or femoral bruits. Chest: symmetric with normal excursions and percussion. Breasts: DEferred patient preference to GYN.  Abdomen: Flat, soft, with bowl sounds. Nontender, no guarding, rebound, hernias, masses, or organomegaly.  Lymphatics: Non tender without lymphadenopathy.  Musculoskeletal: Full ROM all peripheral extremities, joint stability, 5/5 strength, and normal gait. Skin: Warm and dry without rashes, lesions, cyanosis, clubbing  or  ecchymosis.  Neuro: Cranial nerves intact, reflexes equal bilaterally. Normal muscle tone, no cerebellar symptoms. Sensation intact.  Pysch: Awake and oriented X 3, normal affect, Insight and Judgment appropriate.   Assessment and Plan  1. Annual Screening Examination 2. Hypertension  3. Hyperlipidemia 4. Pre Diabetes 5. Vitamin D Deficiency  Continue prudent diet as discussed, weight control, BP monitoring, regular exercise, and medications. Discussed med's effects and SE's. Screening labs and tests as requested with regular follow-up as recommended.

## 2013-11-16 LAB — HEPATIC FUNCTION PANEL
ALBUMIN: 3.8 g/dL (ref 3.5–5.2)
ALT: 12 U/L (ref 0–35)
AST: 16 U/L (ref 0–37)
Alkaline Phosphatase: 65 U/L (ref 39–117)
Bilirubin, Direct: 0.1 mg/dL (ref 0.0–0.3)
Indirect Bilirubin: 0.6 mg/dL (ref 0.2–1.2)
Total Bilirubin: 0.7 mg/dL (ref 0.2–1.2)
Total Protein: 6.9 g/dL (ref 6.0–8.3)

## 2013-11-16 LAB — HEPATITIS C ANTIBODY: HCV AB: NEGATIVE

## 2013-11-16 LAB — BASIC METABOLIC PANEL WITH GFR
BUN: 20 mg/dL (ref 6–23)
CALCIUM: 9.9 mg/dL (ref 8.4–10.5)
CO2: 26 mEq/L (ref 19–32)
CREATININE: 1.06 mg/dL (ref 0.50–1.10)
Chloride: 102 mEq/L (ref 96–112)
GFR, Est African American: 65 mL/min
GFR, Est Non African American: 56 mL/min — ABNORMAL LOW
GLUCOSE: 97 mg/dL (ref 70–99)
Potassium: 4.3 mEq/L (ref 3.5–5.3)
SODIUM: 138 meq/L (ref 135–145)

## 2013-11-16 LAB — URINALYSIS, MICROSCOPIC ONLY
Bacteria, UA: NONE SEEN
Casts: NONE SEEN
Crystals: NONE SEEN
SQUAMOUS EPITHELIAL / LPF: NONE SEEN

## 2013-11-16 LAB — MAGNESIUM: MAGNESIUM: 2.1 mg/dL (ref 1.5–2.5)

## 2013-11-16 LAB — MICROALBUMIN / CREATININE URINE RATIO
CREATININE, URINE: 40 mg/dL
Microalb Creat Ratio: 12.5 mg/g (ref 0.0–30.0)
Microalb, Ur: 0.5 mg/dL (ref 0.00–1.89)

## 2013-11-16 LAB — LIPID PANEL
Cholesterol: 186 mg/dL (ref 0–200)
HDL: 66 mg/dL (ref >39)
LDL Cholesterol: 103 mg/dL — ABNORMAL HIGH (ref 0–99)
Total CHOL/HDL Ratio: 2.8 Ratio
Triglycerides: 83 mg/dL (ref <150)
VLDL: 17 mg/dL (ref 0–40)

## 2013-11-16 LAB — HEPATITIS B CORE ANTIBODY, TOTAL: Hep B Core Total Ab: NONREACTIVE

## 2013-11-16 LAB — HIV ANTIBODY (ROUTINE TESTING W REFLEX): HIV 1&2 Ab, 4th Generation: NONREACTIVE

## 2013-11-16 LAB — HEPATITIS B SURFACE ANTIBODY,QUALITATIVE: Hep B S Ab: NEGATIVE

## 2013-11-16 LAB — TSH: TSH: 0.614 u[IU]/mL (ref 0.350–4.500)

## 2013-11-16 LAB — VITAMIN B12: Vitamin B-12: 611 pg/mL (ref 211–911)

## 2013-11-16 LAB — HEMOGLOBIN A1C
HEMOGLOBIN A1C: 5.8 % — AB (ref <5.7)
Mean Plasma Glucose: 120 mg/dL — ABNORMAL HIGH (ref <117)

## 2013-11-16 LAB — INSULIN, FASTING: INSULIN FASTING, SERUM: 7 u[IU]/mL (ref 3–28)

## 2013-11-16 LAB — VITAMIN D 25 HYDROXY (VIT D DEFICIENCY, FRACTURES): VIT D 25 HYDROXY: 84 ng/mL (ref 30–89)

## 2013-11-16 LAB — HEPATITIS A ANTIBODY, TOTAL: Hep A Total Ab: REACTIVE — AB

## 2013-11-17 LAB — HEPATITIS B E ANTIBODY: Hepatitis Be Antibody: NONREACTIVE

## 2013-12-28 ENCOUNTER — Other Ambulatory Visit: Payer: Self-pay | Admitting: Internal Medicine

## 2014-01-11 ENCOUNTER — Encounter: Payer: Self-pay | Admitting: Gastroenterology

## 2014-01-15 ENCOUNTER — Other Ambulatory Visit: Payer: Self-pay | Admitting: Internal Medicine

## 2014-03-08 ENCOUNTER — Encounter: Payer: Self-pay | Admitting: Physician Assistant

## 2014-03-08 ENCOUNTER — Ambulatory Visit (INDEPENDENT_AMBULATORY_CARE_PROVIDER_SITE_OTHER): Payer: 59 | Admitting: Physician Assistant

## 2014-03-08 VITALS — BP 102/60 | HR 56 | Temp 97.9°F | Resp 16 | Ht 63.5 in | Wt 169.0 lb

## 2014-03-08 DIAGNOSIS — F909 Attention-deficit hyperactivity disorder, unspecified type: Secondary | ICD-10-CM

## 2014-03-08 DIAGNOSIS — K2100 Gastro-esophageal reflux disease with esophagitis, without bleeding: Secondary | ICD-10-CM

## 2014-03-08 DIAGNOSIS — M25511 Pain in right shoulder: Secondary | ICD-10-CM

## 2014-03-08 DIAGNOSIS — Z79899 Other long term (current) drug therapy: Secondary | ICD-10-CM

## 2014-03-08 DIAGNOSIS — E559 Vitamin D deficiency, unspecified: Secondary | ICD-10-CM

## 2014-03-08 DIAGNOSIS — E782 Mixed hyperlipidemia: Secondary | ICD-10-CM

## 2014-03-08 DIAGNOSIS — E039 Hypothyroidism, unspecified: Secondary | ICD-10-CM

## 2014-03-08 DIAGNOSIS — I10 Essential (primary) hypertension: Secondary | ICD-10-CM

## 2014-03-08 DIAGNOSIS — R7303 Prediabetes: Secondary | ICD-10-CM

## 2014-03-08 DIAGNOSIS — F988 Other specified behavioral and emotional disorders with onset usually occurring in childhood and adolescence: Secondary | ICD-10-CM

## 2014-03-08 LAB — HEMOGLOBIN A1C
HEMOGLOBIN A1C: 5.4 % (ref <5.7)
Mean Plasma Glucose: 108 mg/dL (ref <117)

## 2014-03-08 LAB — CBC WITH DIFFERENTIAL/PLATELET
Basophils Absolute: 0.1 10*3/uL (ref 0.0–0.1)
Basophils Relative: 1 % (ref 0–1)
EOS PCT: 6 % — AB (ref 0–5)
Eosinophils Absolute: 0.3 10*3/uL (ref 0.0–0.7)
HCT: 37.6 % (ref 36.0–46.0)
HEMOGLOBIN: 13 g/dL (ref 12.0–15.0)
LYMPHS ABS: 2.1 10*3/uL (ref 0.7–4.0)
Lymphocytes Relative: 39 % (ref 12–46)
MCH: 31 pg (ref 26.0–34.0)
MCHC: 34.6 g/dL (ref 30.0–36.0)
MCV: 89.5 fL (ref 78.0–100.0)
Monocytes Absolute: 0.5 10*3/uL (ref 0.1–1.0)
Monocytes Relative: 10 % (ref 3–12)
Neutro Abs: 2.4 10*3/uL (ref 1.7–7.7)
Neutrophils Relative %: 44 % (ref 43–77)
Platelets: 305 10*3/uL (ref 150–400)
RBC: 4.2 MIL/uL (ref 3.87–5.11)
RDW: 13.8 % (ref 11.5–15.5)
WBC: 5.4 10*3/uL (ref 4.0–10.5)

## 2014-03-08 MED ORDER — ESOMEPRAZOLE MAGNESIUM 40 MG PO CPDR: 40.0000 mg | DELAYED_RELEASE_CAPSULE | Freq: Every day | ORAL | Status: DC

## 2014-03-08 MED ORDER — METHYLPHENIDATE HCL ER (LA) 20 MG PO CP24: 20.0000 mg | ORAL_CAPSULE | Freq: Every day | ORAL | Status: DC

## 2014-03-08 NOTE — Progress Notes (Signed)
Assessment and Plan:  Hypertension: Continue medication, monitor blood pressure at home. Continue DASH diet.  Reminder to go to the ER if any CP, SOB, nausea, dizziness, severe HA, changes vision/speech, left arm numbness and tingling, and jaw pain. Cholesterol: Continue diet and exercise. Check cholesterol.  Pre-diabetes-Continue diet and exercise. Check A1C Vitamin D Def- check level and continue medications.  ADD-  Continue ADD medication, helps with focus, no AE's. The patient was counseled on the addictive nature of the medication and was encouraged to take drug holidays when not needed.  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 Not likely cardio, but if it happens with exertion/accaompaniments will send to cardio/ER.   Continue diet and meds as discussed. Further disposition pending results of labs.  HPI 63 y.o. female  presents for 3 month follow up with hypertension, hyperlipidemia, prediabetes and vitamin D. Her blood pressure has been controlled at home, today their BP is BP: 102/60 mmHg She does not workout. She denies chest pain, shortness of breath, dizziness.  She is not on cholesterol medication and denies myalgias. Her cholesterol is at goal. The cholesterol last visit was:   Lab Results  Component Value Date   CHOL 186 11/15/2013   HDL 66 11/15/2013   LDLCALC 103* 11/15/2013   TRIG 83 11/15/2013   CHOLHDL 2.8 11/15/2013   She has been working on diet and exercise for prediabetes, and denies paresthesia of the feet, polydipsia, polyuria and visual disturbances. Last A1C in the office was:  Lab Results  Component Value Date   HGBA1C 5.8* 11/15/2013   Patient is on Vitamin D supplement.   Lab Results  Component Value Date   VD25OH 69 11/15/2013     She is on thyroid  medication. Her medication was not changed last visit. Patient denies fatigue, weight changes, heat/cold intolerance, bowel/skin changes or CVS symptoms  Lab Results  Component Value Date   TSH 0.614 11/15/2013  .  She is on wellbutrin but uncertain which dose she is on.  She has had ADD for years, has been off ritalin and takes very sparingly but she would like a refill. She is on Ritalin 20mg  ER and states it helps her at work.  Has been having right shoulder blade pain, burning sensation, lasts 30 secs, denies any accompaniments, radiation, and nothing makes it worse or better.   Current Medications:  Current Outpatient Prescriptions on File Prior to Visit  Medication Sig Dispense Refill  . acetaminophen (TYLENOL) 500 MG tablet Take 500 mg by mouth every 6 (six) hours as needed for pain.    Marland Kitchen aspirin 81 MG chewable tablet Chew 81 mg by mouth daily.      Marland Kitchen atenolol (TENORMIN) 100 MG tablet TAKE 1 TABLET BY MOUTH DAILY 90 tablet 3  . buPROPion (WELLBUTRIN SR) 150 MG 12 hr tablet Take 150 mg by mouth daily.     Marland Kitchen buPROPion (WELLBUTRIN XL) 300 MG 24 hr tablet TAKE 1 TABLET BY MOUTH DAILY 90 tablet 0  . calcium carbonate (OS-CAL) 600 MG TABS Take 600 mg by mouth daily.    . calcium carbonate (TUMS - DOSED IN MG ELEMENTAL CALCIUM) 500 MG chewable tablet Chew 1 tablet by mouth as needed. Uses Rolaids    . Cholecalciferol (VITAMIN D-3) 5000 UNITS TABS Take 1 tablet by mouth  daily.    . hyoscyamine (LEVSIN SL) 0.125 MG SL tablet Place 1 tablet (0.125 mg total) under the tongue every 4 (four) hours as needed. Stomach pain 100 tablet 99  . levothyroxine (SYNTHROID, LEVOTHROID) 100 MCG tablet Take 100 mcg by mouth daily. 1.5 Tue Thur only 1 rest of week    . Magnesium 250 MG TABS Take 250 mg by mouth daily.    . ondansetron (ZOFRAN) 8 MG tablet TAKE 1 TABLET BY MOUTH 3 TIMES A DAY AS NEEDED FOR NAUSEA 30 tablet 3  . OSCIMIN SR 0.375 MG 12 hr tablet TAKE 1-2 TABLETS BY MOUTH TWICE DAILY 120 tablet PRN    No current facility-administered medications on file prior to visit.   Medical History:  Past Medical History  Diagnosis Date  . Hypothyroid   . Hypertension   . ADD (attention deficit disorder with hyperactivity)   . OCD (obsessive compulsive disorder)   . IBS (irritable bowel syndrome)   . Diverticulosis   . Irregular heart rate     left bundle branch block  . Helicobacter pylori (H. pylori)   . History of IBS   . Depression   . Prediabetes   . Migraines   . Vitamin D deficiency    Allergies:  Allergies  Allergen Reactions  . Ace Inhibitors     Acute renal failure  . Cephalexin Anaphylaxis  . Codeine Nausea Only  . Hydrochlorothiazide   . Sulfa Antibiotics Rash   Review of Systems: see HPI  Family history- Review and unchanged Social history- Review and unchanged Physical Exam: BP 102/60 mmHg  Pulse 56  Temp(Src) 97.9 F (36.6 C)  Resp 16  Ht 5' 3.5" (1.613 m)  Wt 169 lb (76.658 kg)  BMI 29.46 kg/m2 Wt Readings from Last 3 Encounters:  03/08/14 169 lb (76.658 kg)  11/15/13 170 lb 6.4 oz (77.293 kg)  08/24/13 170 lb (77.111 kg)   General Appearance: Well nourished, in no apparent distress. Eyes: PERRLA, EOMs, conjunctiva no swelling or erythema Sinuses: No Frontal/maxillary tenderness ENT/Mouth: Ext aud canals clear, TMs without erythema, bulging. No erythema, swelling, or exudate on post pharynx.  Tonsils not swollen or erythematous. Hearing normal.  Neck: Supple, thyroid normal.  Respiratory: Respiratory effort normal, BS equal bilaterally without rales, rhonchi, wheezing or stridor.  Cardio: RRR with no MRGs. Brisk peripheral pulses without edema.  Abdomen: Soft, + BS.  + epigastric tenderness, no guarding, rebound, hernias, masses. Lymphatics: Non tender without lymphadenopathy.  Musculoskeletal: Full ROM, 5/5 strength, normal gait.  Skin: Warm, dry without rashes, lesions, ecchymosis.  Neuro: Cranial nerves intact. Normal muscle tone, no cerebellar  symptoms. Sensation intact.  Psych: Awake and oriented X 3, normal affect, Insight and Judgment appropriate.    Vicie Mutters, PA-C 8:59 AM [facility]

## 2014-03-08 NOTE — Patient Instructions (Signed)
    Bad carbs also include fruit juice, alcohol, and sweet tea. These are empty calories that do not signal to your brain that you are full.   Please remember the good carbs are still carbs which convert into sugar. So please measure them out no more than 1/2-1 cup of rice, oatmeal, pasta, and beans  Veggies are however free foods! Pile them on.   Not all fruit is created equal. Please see the list below, the fruit at the bottom is higher in sugars than the fruit at the top. Please avoid all dried fruits.    Take the nexium/protonix for 2-4 more weeks once a day, then switch to pepcid or zantac (generic is fine) 2 x a day for 2 weeks, then once a day for 2 weeks and then stop. Avoid alcohol, spicy foods, NSAIDS (aleve, ibuprofen) at this time. See foods below.   Food Choices for Gastroesophageal Reflux Disease When you have gastroesophageal reflux disease (GERD), the foods you eat and your eating habits are very important. Choosing the right foods can help ease the discomfort of GERD. WHAT GENERAL GUIDELINES DO I NEED TO FOLLOW?  Choose fruits, vegetables, whole grains, low-fat dairy products, and low-fat meat, fish, and poultry.  Limit fats such as oils, salad dressings, butter, nuts, and avocado.  Keep a food diary to identify foods that cause symptoms.  Avoid foods that cause reflux. These may be different for different people.  Eat frequent small meals instead of three large meals each day.  Eat your meals slowly, in a relaxed setting.  Limit fried foods.  Cook foods using methods other than frying.  Avoid drinking alcohol.  Avoid drinking large amounts of liquids with your meals.  Avoid bending over or lying down until 2-3 hours after eating. WHAT FOODS ARE NOT RECOMMENDED? The following are some foods and drinks that may worsen your symptoms: Vegetables Tomatoes. Tomato juice. Tomato and spaghetti sauce. Chili peppers. Onion and garlic. Horseradish. Fruits Oranges,  grapefruit, and lemon (fruit and juice). Meats High-fat meats, fish, and poultry. This includes hot dogs, ribs, ham, sausage, salami, and bacon. Dairy Whole milk and chocolate milk. Sour cream. Cream. Butter. Ice cream. Cream cheese.  Beverages Coffee and tea, with or without caffeine. Carbonated beverages or energy drinks. Condiments Hot sauce. Barbecue sauce.  Sweets/Desserts Chocolate and cocoa. Donuts. Peppermint and spearmint. Fats and Oils High-fat foods, including [place] fries and potato chips. Other Vinegar. Strong spices, such as black pepper, white pepper, red pepper, cayenne, curry powder, cloves, ginger, and chili powder.

## 2014-03-09 LAB — LIPID PANEL
CHOL/HDL RATIO: 2.2 ratio
Cholesterol: 149 mg/dL (ref 0–200)
HDL: 69 mg/dL (ref >39)
LDL Cholesterol: 64 mg/dL (ref 0–99)
Triglycerides: 80 mg/dL (ref <150)
VLDL: 16 mg/dL (ref 0–40)

## 2014-03-09 LAB — BASIC METABOLIC PANEL WITH GFR
BUN: 34 mg/dL — ABNORMAL HIGH (ref 6–23)
CALCIUM: 9.2 mg/dL (ref 8.4–10.5)
CO2: 30 mEq/L (ref 19–32)
Chloride: 104 mEq/L (ref 96–112)
Creat: 0.98 mg/dL (ref 0.50–1.10)
GFR, EST AFRICAN AMERICAN: 71 mL/min
GFR, Est Non African American: 62 mL/min
GLUCOSE: 94 mg/dL (ref 70–99)
Potassium: 4.5 mEq/L (ref 3.5–5.3)
SODIUM: 139 meq/L (ref 135–145)

## 2014-03-09 LAB — HEPATIC FUNCTION PANEL
ALBUMIN: 3.6 g/dL (ref 3.5–5.2)
ALT: 16 U/L (ref 0–35)
AST: 17 U/L (ref 0–37)
Alkaline Phosphatase: 59 U/L (ref 39–117)
BILIRUBIN DIRECT: 0.1 mg/dL (ref 0.0–0.3)
Indirect Bilirubin: 0.4 mg/dL (ref 0.2–1.2)
Total Bilirubin: 0.5 mg/dL (ref 0.2–1.2)
Total Protein: 6.4 g/dL (ref 6.0–8.3)

## 2014-03-09 LAB — TSH: TSH: 0.064 u[IU]/mL — AB (ref 0.350–4.500)

## 2014-03-09 LAB — MAGNESIUM: MAGNESIUM: 1.8 mg/dL (ref 1.5–2.5)

## 2014-03-09 LAB — VITAMIN D 25 HYDROXY (VIT D DEFICIENCY, FRACTURES): Vit D, 25-Hydroxy: 83 ng/mL (ref 30–89)

## 2014-03-09 LAB — INSULIN, FASTING: Insulin fasting, serum: 3.3 u[IU]/mL (ref 2.0–19.6)

## 2014-04-02 ENCOUNTER — Encounter: Payer: Self-pay | Admitting: Physician Assistant

## 2014-04-02 ENCOUNTER — Ambulatory Visit (INDEPENDENT_AMBULATORY_CARE_PROVIDER_SITE_OTHER): Payer: 59 | Admitting: Physician Assistant

## 2014-04-02 VITALS — BP 106/62 | HR 72 | Temp 98.2°F | Resp 18 | Ht 63.5 in | Wt 170.0 lb

## 2014-04-02 DIAGNOSIS — N3289 Other specified disorders of bladder: Secondary | ICD-10-CM

## 2014-04-02 DIAGNOSIS — R300 Dysuria: Secondary | ICD-10-CM

## 2014-04-02 DIAGNOSIS — K589 Irritable bowel syndrome without diarrhea: Secondary | ICD-10-CM

## 2014-04-02 MED ORDER — CIPROFLOXACIN HCL 250 MG PO TABS: 250.0000 mg | ORAL_TABLET | Freq: Two times a day (BID) | ORAL | Status: DC

## 2014-04-02 MED ORDER — HYOSCYAMINE SULFATE 0.125 MG SL SUBL: 0.2500 mg | SUBLINGUAL_TABLET | SUBLINGUAL | Status: DC | PRN

## 2014-04-02 NOTE — Progress Notes (Addendum)
Subjective:    Patient ID: [name], female    DOB: 1950-08-01, 63 y.o.   MRN: [id number]  Dysuria  This is a new problem. Episode onset: 3 days ago. The problem occurs every urination. The quality of the pain is described as burning (Pinch and then cramping pain). The pain is at a severity of 8/10. She is not sexually active. There is a history of pyelonephritis (A couple years ago). Associated symptoms include chills, frequency and urgency. Pertinent negatives include no flank pain. Associated symptoms comments: Is having bladder spasms and had incontinence last Saturday.. Treatments tried: Azo  The treatment provided mild relief. There is no history of kidney stones.  States she only takes Hyoscamine for the acute symptoms for bladder spasms and stomach pain. Takes Oscimin for long term bladder spasms?? Patient states she did she Dr. Gaynelle Arabian before for urinary issues.  She states she has h/o interstitial cystitis.  She states she has been diagnosed with cystocele and rectocele.  Patient refuses surgery for prolapses due to being scarred of getting infection during surgery (MRSA). GFR= 62 on 03/08/14 Total hysterectomy Review of Systems  Constitutional: Positive for chills. Negative for fever, diaphoresis and fatigue.  HENT: Negative.   Eyes: Negative.   Respiratory: Negative.   Cardiovascular: Negative.   Gastrointestinal: Positive for abdominal pain.       Generalized abdominal pain and cramping. Has IBS and history of interstitial cystitis.  Genitourinary: Positive for dysuria, urgency and frequency. Negative for flank pain, vaginal bleeding and vaginal discharge.  Skin: Negative.  Negative for rash.  Neurological: Negative.  Negative for dizziness, light-headedness and headaches.  Psychiatric/Behavioral: Positive for sleep disturbance. The patient is nervous/anxious.    Past Medical History  Diagnosis Date  . Hypothyroid   . Hypertension   . ADD (attention deficit disorder  with hyperactivity)   . OCD (obsessive compulsive disorder)   . IBS (irritable bowel syndrome)   . Diverticulosis   . Irregular heart rate     left bundle branch block  . Helicobacter pylori (H. pylori)   . History of IBS   . Depression   . Prediabetes   . Migraines   . Vitamin D deficiency    Current Outpatient Prescriptions on File Prior to Visit  Medication Sig Dispense Refill  . acetaminophen (TYLENOL) 500 MG tablet Take 500 mg by mouth every 6 (six) hours as needed for pain.    Marland Kitchen aspirin 81 MG chewable tablet Chew 81 mg by mouth daily.      Marland Kitchen atenolol (TENORMIN) 100 MG tablet TAKE 1 TABLET BY MOUTH DAILY 90 tablet 3  . buPROPion (WELLBUTRIN SR) 150 MG 12 hr tablet Take 150 mg by mouth daily.     Marland Kitchen buPROPion (WELLBUTRIN XL) 300 MG 24 hr tablet TAKE 1 TABLET BY MOUTH DAILY 90 tablet 0  . calcium carbonate (OS-CAL) 600 MG TABS Take 600 mg by mouth daily.    . calcium carbonate (TUMS - DOSED IN MG ELEMENTAL CALCIUM) 500 MG chewable tablet Chew 1 tablet by mouth as needed. Uses Rolaids    . Cholecalciferol (VITAMIN D-3) 5000 UNITS TABS Take 1 tablet by mouth daily.    Marland Kitchen esomeprazole (NEXIUM) 40 MG capsule Take 1 capsule (40 mg total) by mouth daily. 10 capsule 0  . hyoscyamine (LEVSIN SL) 0.125 MG SL tablet Place 1 tablet (0.125 mg total) under the tongue every 4 (four) hours as needed. Stomach pain 100 tablet 99  . levothyroxine (SYNTHROID, LEVOTHROID) 100 MCG  tablet Take 100 mcg by mouth daily. 1.5 Tue Thur only 1 rest of week    . Magnesium 250 MG TABS Take 250 mg by mouth daily.    . methylphenidate (RITALIN LA) 20 MG 24 hr capsule Take 1 capsule (20 mg total) by mouth daily. 30 capsule 0  . ondansetron (ZOFRAN) 8 MG tablet TAKE 1 TABLET BY MOUTH 3 TIMES A DAY AS NEEDED FOR NAUSEA 30 tablet 3  . OSCIMIN SR 0.375 MG 12 hr tablet TAKE 1-2 TABLETS BY MOUTH TWICE DAILY 120 tablet PRN   No current facility-administered medications on file prior to visit.   Allergies  Allergen  Reactions  . Ace Inhibitors     Acute renal failure  . Cephalexin Anaphylaxis  . Codeine Nausea Only  . Hydrochlorothiazide   . Macrobid [Nitrofurantoin]   . Sulfa Antibiotics Rash     BP 106/62 mmHg  Pulse 72  Temp(Src) 98.2 F (36.8 C) (Temporal)  Resp 18  Ht 5' 3.5" (1.613 m)  Wt 170 lb (77.111 kg)  BMI 29.64 kg/m2  SpO2 97% Wt Readings from Last 3 Encounters:  04/02/14 170 lb (77.111 kg)  03/08/14 169 lb (76.658 kg)  11/15/13 170 lb 6.4 oz (77.293 kg)   Objective:   Physical Exam  Constitutional: She is oriented to person, place, and time. She appears well-developed. She does not have a sickly appearance. No distress.  HENT:  Head: Normocephalic.  Eyes: Conjunctivae and lids are normal. Right eye exhibits no discharge. Left eye exhibits no discharge. No scleral icterus.  Neck: Phonation normal.  Cardiovascular: Normal rate, regular rhythm, S1 normal, S2 normal, normal heart sounds and normal pulses.  Exam reveals no gallop, no distant heart sounds and no friction rub.   No murmur heard. Pulmonary/Chest: Effort normal and breath sounds normal. No respiratory distress. She has no decreased breath sounds. She has no wheezes. She has no rhonchi. She has no rales. She exhibits no tenderness.  Abdominal: Soft. Normal appearance and bowel sounds are normal. She exhibits no distension and no abdominal bruit. There is no hepatosplenomegaly. There is tenderness in the suprapubic area. There is no rebound, no guarding and no CVA tenderness. No hernia.  Neurological: She is alert and oriented to person, place, and time. Gait normal.  Skin: Skin is warm, dry and intact. No rash noted. She is not diaphoretic.  Psychiatric: She has a normal mood and affect. Her speech is normal and behavior is normal. Judgment and thought content normal. Cognition and memory are normal.  Vitals reviewed.  Assessment & Plan:  1. Dysuria Ordered labs to R/O UTI. - Urine culture - Urinalysis, Routine w  reflex microscopic - Take Cipro as prescribed with food until sensitivity report comes back- ciprofloxacin (CIPRO) 250 MG tablet; Take 1 tablet (250 mg total) by mouth 2 (two) times daily. For 5 days  Dispense: 10 tablet; Refill: 0  2. IBS (irritable bowel syndrome) and Bladder Spasms -Take Levsin as prescribed for bladder spasms and IBS- hyoscyamine (LEVSIN SL) 0.125 MG SL tablet; Place 2 tablets (0.25 mg total) under the tongue every 4 (four) hours as needed. Stomach pain and bladder spasms  Dispense: 100 tablet; Refill: 2 -Might discontinue Oscimin?? -Told patient to pay attention to I's and O's.  Discussed medication effects and SE's.  Pt agreed to treatment plan. If you are experiencing urinary retention, bladder pain, abdominal pain, nausea, vomiting or constipation, then go to ED immediately.   If you are not feeling better in 7-10  days, then please call the office.  Addendum:  Spoke with Dr. Melford Aase and Vicie Mutters, PA-C Will have Wells Guiles ([place]) call patient to clarify medications today (04/03/14)- Can you call patient and ask her if she is taking regular hyoscyamine (Levsin) along with Oscimin (extended release of hyoscyamine).  If she is, then please tell her to stop the Oscimin while she is on the short acting Levsin.   She can only be on one at a time due to anticholinergic effects.  Can cause blindness, urinary retention, bowel ileus/constipation, and dry mouth.   We can try her on a low dose of amitriptyline to take at bedtime for bladder spasms along with the short acting Levsin.   Maxine Huynh, Stephani Police, PA-C 3:15 PM [facility]

## 2014-04-02 NOTE — Addendum Note (Signed)
Addended by: Charolette Forward on: 04/02/2014 08:47 PM   Modules accepted: Miquel Dunn

## 2014-04-02 NOTE — Patient Instructions (Addendum)
-Take Cipro as prescribed with food.  Can take Zofran at home for nausea. -Will message you on MyChart with results. Might have to change medication depending on sensitivity report. -Take Hyoscyamine 0.125mg - 2 tablets under tongue every 4 hours as needed for bladder spasms and stomach pain.   If you notice urinary retention and having abdominal pain, nausea, vomiting or constipation, then go to ED immediately.    If you are not feeling better in 7-10 days, then please call the office.   Urinary Tract Infection Urinary tract infections (UTIs) can develop anywhere along your urinary tract. Your urinary tract is your body's drainage system for removing wastes and extra water. Your urinary tract includes two kidneys, two ureters, a bladder, and a urethra. Your kidneys are a pair of bean-shaped organs. Each kidney is about the size of your fist. They are located below your ribs, one on each side of your spine. CAUSES Infections are caused by microbes, which are microscopic organisms, including fungi, viruses, and bacteria. These organisms are so small that they can only be seen through a microscope. Bacteria are the microbes that most commonly cause UTIs. SYMPTOMS  Symptoms of UTIs may vary by age and gender of the patient and by the location of the infection. Symptoms in young women typically include a frequent and intense urge to urinate and a painful, burning feeling in the bladder or urethra during urination. Older women and men are more likely to be tired, shaky, and weak and have muscle aches and abdominal pain. A fever may mean the infection is in your kidneys. Other symptoms of a kidney infection include pain in your back or sides below the ribs, nausea, and vomiting. DIAGNOSIS To diagnose a UTI, your caregiver will ask you about your symptoms. Your caregiver also will ask to provide a urine sample. The urine sample will be tested for bacteria and white blood cells. White blood cells are made by  your body to help fight infection. TREATMENT  Typically, UTIs can be treated with medication. Because most UTIs are caused by a bacterial infection, they usually can be treated with the use of antibiotics. The choice of antibiotic and length of treatment depend on your symptoms and the type of bacteria causing your infection. HOME CARE INSTRUCTIONS  If you were prescribed antibiotics, take them exactly as your caregiver instructs you. Finish the medication even if you feel better after you have only taken some of the medication.  Drink enough water and fluids to keep your urine clear or pale yellow.  Avoid caffeine, tea, and carbonated beverages. They tend to irritate your bladder.  Empty your bladder often. Avoid holding urine for long periods of time.  Empty your bladder before and after sexual intercourse.  After a bowel movement, women should cleanse from front to back. Use each tissue only once. SEEK MEDICAL CARE IF:   You have back pain.  You develop a fever.  Your symptoms do not begin to resolve within 3 days. SEEK IMMEDIATE MEDICAL CARE IF:   You have severe back pain or lower abdominal pain.  You develop chills.  You have nausea or vomiting.  You have continued burning or discomfort with urination. MAKE SURE YOU:   Understand these instructions.  Will watch your condition.  Will get help right away if you are not doing well or get worse. Document Released: 01/21/2005 Document Revised: 10/13/2011 Document Reviewed: 05/22/2011 [facility] Patient Information 2015 [place], [place]. This information is not intended to replace advice given  to you by your health care provider. Make sure you discuss any questions you have with your health care provider.  

## 2014-04-03 ENCOUNTER — Telehealth: Payer: Self-pay | Admitting: *Deleted

## 2014-04-03 LAB — URINALYSIS, ROUTINE W REFLEX MICROSCOPIC
BILIRUBIN URINE: NEGATIVE
Glucose, UA: NEGATIVE mg/dL
Hgb urine dipstick: NEGATIVE
Ketones, ur: NEGATIVE mg/dL
NITRITE: POSITIVE — AB
PROTEIN: NEGATIVE mg/dL
SPECIFIC GRAVITY, URINE: 1.016 (ref 1.005–1.030)
UROBILINOGEN UA: 1 mg/dL (ref 0.0–1.0)
pH: 7 (ref 5.0–8.0)

## 2014-04-03 LAB — URINALYSIS, MICROSCOPIC ONLY
Casts: NONE SEEN
Crystals: NONE SEEN
SQUAMOUS EPITHELIAL / LPF: NONE SEEN

## 2014-04-03 MED ORDER — AMITRIPTYLINE HCL 10 MG PO TABS: 10.0000 mg | ORAL_TABLET | Freq: Every day | ORAL | Status: DC

## 2014-04-03 NOTE — Addendum Note (Signed)
Addended by: Charolette Forward on: 04/03/2014 08:45 AM   Modules accepted: Miquel Dunn

## 2014-04-03 NOTE — Telephone Encounter (Signed)
-----   Message from Charolette Forward, PA-C sent at 04/03/2014  8:31 AM EST ----- Regarding: Medication Clarification  Can you call patient and ask her if she is taking regular hyoscyamine (Levsin) along with Oscimin (extended release of hyoscyamine).  If she is, then please tell her to stop the Oscimin while she is on the short acting Levsin.    She can only be on one at a time due to anticholinergic effects.  (can' see , can't pee, can't have bowel movement, can't spit).  We can try her on a low dose of amitriptyline to take at bedtime for bladder spasms along with the short acting Levsin.  Thanks,   [employer], PA-C

## 2014-04-03 NOTE — Telephone Encounter (Signed)
Patient states she is currently taking both the Levsin and Oscimin.  Advised her to d/c taking both and she wants to try the Amitriptyline Rx with the Levisin to see if that offers any better relief of bladder symptoms.  Rx sent into [facility] per Jennifer's orders.

## 2014-04-04 LAB — URINE CULTURE: Colony Count: 9000

## 2014-04-12 ENCOUNTER — Other Ambulatory Visit: Payer: Self-pay | Admitting: Internal Medicine

## 2014-04-12 ENCOUNTER — Other Ambulatory Visit: Payer: Self-pay | Admitting: *Deleted

## 2014-04-12 MED ORDER — ALPRAZOLAM 0.5 MG PO TABS: ORAL_TABLET | ORAL | Status: DC

## 2014-04-15 ENCOUNTER — Other Ambulatory Visit: Payer: Self-pay | Admitting: Physician Assistant

## 2014-04-15 ENCOUNTER — Telehealth: Payer: Self-pay | Admitting: Internal Medicine

## 2014-04-15 ENCOUNTER — Other Ambulatory Visit: Payer: Self-pay | Admitting: Internal Medicine

## 2014-04-15 DIAGNOSIS — K2100 Gastro-esophageal reflux disease with esophagitis, without bleeding: Secondary | ICD-10-CM

## 2014-04-15 MED ORDER — BUPROPION HCL ER (XL) 300 MG PO TB24: 300.0000 mg | ORAL_TABLET | Freq: Every day | ORAL | Status: DC

## 2014-04-15 MED ORDER — ALPRAZOLAM 0.5 MG PO TABS: ORAL_TABLET | ORAL | Status: DC

## 2014-04-15 MED ORDER — ESOMEPRAZOLE MAGNESIUM 40 MG PO CPDR: 40.0000 mg | DELAYED_RELEASE_CAPSULE | Freq: Every day | ORAL | Status: DC

## 2014-04-16 ENCOUNTER — Other Ambulatory Visit: Payer: Self-pay | Admitting: Internal Medicine

## 2014-05-29 ENCOUNTER — Other Ambulatory Visit: Payer: Self-pay | Admitting: Internal Medicine

## 2014-05-29 DIAGNOSIS — F988 Other specified behavioral and emotional disorders with onset usually occurring in childhood and adolescence: Secondary | ICD-10-CM

## 2014-05-29 MED ORDER — METHYLPHENIDATE HCL ER (LA) 20 MG PO CP24: 20.0000 mg | ORAL_CAPSULE | Freq: Every day | ORAL | Status: DC

## 2014-06-12 ENCOUNTER — Encounter: Payer: Self-pay | Admitting: Internal Medicine

## 2014-06-12 ENCOUNTER — Other Ambulatory Visit: Payer: Self-pay | Admitting: Physician Assistant

## 2014-06-12 ENCOUNTER — Ambulatory Visit (INDEPENDENT_AMBULATORY_CARE_PROVIDER_SITE_OTHER): Payer: 59 | Admitting: Internal Medicine

## 2014-06-12 VITALS — BP 112/76 | HR 68 | Temp 97.9°F | Resp 16 | Ht 63.5 in | Wt 172.8 lb

## 2014-06-12 DIAGNOSIS — Z79899 Other long term (current) drug therapy: Secondary | ICD-10-CM

## 2014-06-12 DIAGNOSIS — R300 Dysuria: Secondary | ICD-10-CM

## 2014-06-12 DIAGNOSIS — E782 Mixed hyperlipidemia: Secondary | ICD-10-CM | POA: Insufficient documentation

## 2014-06-12 DIAGNOSIS — F900 Attention-deficit hyperactivity disorder, predominantly inattentive type: Secondary | ICD-10-CM

## 2014-06-12 DIAGNOSIS — R7309 Other abnormal glucose: Secondary | ICD-10-CM

## 2014-06-12 DIAGNOSIS — I10 Essential (primary) hypertension: Secondary | ICD-10-CM

## 2014-06-12 DIAGNOSIS — E559 Vitamin D deficiency, unspecified: Secondary | ICD-10-CM

## 2014-06-12 DIAGNOSIS — R7303 Prediabetes: Secondary | ICD-10-CM

## 2014-06-12 HISTORY — DX: Other long term (current) drug therapy: Z79.899

## 2014-06-12 LAB — CBC WITH DIFFERENTIAL/PLATELET
BASOS ABS: 0.1 10*3/uL (ref 0.0–0.1)
BASOS PCT: 1 % (ref 0–1)
EOS PCT: 6 % — AB (ref 0–5)
Eosinophils Absolute: 0.3 10*3/uL (ref 0.0–0.7)
HCT: 39.5 % (ref 36.0–46.0)
Hemoglobin: 13.3 g/dL (ref 12.0–15.0)
Lymphocytes Relative: 40 % (ref 12–46)
Lymphs Abs: 2 10*3/uL (ref 0.7–4.0)
MCH: 30.9 pg (ref 26.0–34.0)
MCHC: 33.7 g/dL (ref 30.0–36.0)
MCV: 91.9 fL (ref 78.0–100.0)
MPV: 9.9 fL (ref 8.6–12.4)
Monocytes Absolute: 0.4 10*3/uL (ref 0.1–1.0)
Monocytes Relative: 7 % (ref 3–12)
Neutro Abs: 2.3 10*3/uL (ref 1.7–7.7)
Neutrophils Relative %: 46 % (ref 43–77)
PLATELETS: 287 10*3/uL (ref 150–400)
RBC: 4.3 MIL/uL (ref 3.87–5.11)
RDW: 14.3 % (ref 11.5–15.5)
WBC: 5 10*3/uL (ref 4.0–10.5)

## 2014-06-12 LAB — HEMOGLOBIN A1C
Hgb A1c MFr Bld: 5.5 % (ref <5.7)
Mean Plasma Glucose: 111 mg/dL (ref <117)

## 2014-06-12 MED ORDER — PREDNISONE 20 MG PO TABS: ORAL_TABLET | ORAL | Status: DC

## 2014-06-12 MED ORDER — AMITRIPTYLINE HCL 25 MG PO TABS: ORAL_TABLET | ORAL | Status: DC

## 2014-06-12 NOTE — Progress Notes (Signed)
Patient ID: [name], female   DOB: [birth year], 64 y.o.   MRN: [id number]   This very nice 64 y.o. DWF presents for 3 month follow up with Hypertension, Hyperlipidemia, Pre-Diabetes and Vitamin D Deficiency.    Patient is treated for HTN since 2003 & BP has been controlled at home. Today's BP: 112/76 mmHg. Patient has had no complaints of any cardiac type chest pain, palpitations, dyspnea/orthopnea/PND, dizziness, claudication, or dependent edema.   Hyperlipidemia is controlled with diet & meds. Patient denies myalgias or other med SE's. Last Lipids were at goal -  Total Chol 149; HDL  69; LDL  64; Trig 80 on 03/08/2014.   Also, the patient has history of PreDiabetes and has had no symptoms of reactive hypoglycemia, diabetic polys, paresthesias or visual blurring.  Last A1c was 5.4% on 03/08/2014.   Further, the patient also has history of Vitamin D Deficiency of 18 in 2008 and supplements vitamin D without any suspected side-effects. Last vitamin D was 83 on 03/08/2014.  Medication Sig  . acetaminophen  500 MG Take 500 mg by mouth every 6 (six) hours as needed for pain.  Marland Kitchen ALPRAZolam0.5 MG tablet Take 1/2 to 1 tablet 3 x day if needed for anxiety or sleep  . aspirin 81 MG chewable tablet Chew 81 mg by mouth daily.    Marland Kitchen atenolol 100 MG tablet TAKE 1 TABLET BY MOUTH DAILY  . SUPER B COMPLEX  Take by mouth daily.  Marland Kitchen buPROPion - XL) 300 MG 24 hr tablet TAKE 1 TABLET BY MOUTH DAILY  . OS-CAL 600 MG  Take 600 mg by mouth daily.  . calcium carbonate  500 MG  Chew 1 tablet by mouth as needed. Uses Rolaids  . VITAMIN D 5000 UNITS TABS Take 1 tablet by mouth daily.  Marland Kitchen esomeprazole (NEXIUM) 40 MG  Take 1 capsule (40 mg total) by mouth daily.  . hyoscyamine SL) 0.125 MG SL tablet Place 2 tablets (0.25 mg total) under the tongue every 4 (four) hours as needed.   Marland Kitchen levothyroxine  100 MCG tablet Take 100 mcg by mouth daily. 1.5 Tue Thur only 1 rest of week  . Magnesium 250 MG TABS Take 250 mg by mouth  daily.  Marland Kitchen RITALIN LA 20 MG 24 hr capsule Take 1 capsule (20 mg total) by mouth daily.  . Multiple Vitamins-Minerals  Take 1 tablet by mouth daily.  . ondansetron (ZOFRAN) 8 MG tablet TAKE 1 TABLET BY MOUTH 3 TIMES A DAY AS NEEDED FOR NAUSEA  . OSCIMIN SR 0.375 MG 12 hr tablet TAKE 1-2 TABLETS BY MOUTH TWICE DAILY   Allergies  Allergen Reactions  . Ace Inhibitors     Acute renal failure  . Cephalexin Anaphylaxis  . Codeine Nausea Only  . Hydrochlorothiazide   . Macrobid [Nitrofurantoin]   . Sulfa Antibiotics Rash   PMHx:   Past Medical History  Diagnosis Date  . Hypothyroid   . Hypertension   . ADD (attention deficit disorder with hyperactivity)   . OCD (obsessive compulsive disorder)   . IBS (irritable bowel syndrome)   . Diverticulosis   . Irregular heart rate     left bundle branch block  . Helicobacter pylori (H. pylori)   . History of IBS   . Depression   . Prediabetes   . Migraines   . Vitamin D deficiency    Immunization History  Administered Date(s) Administered  . Pneumococcal Polysaccharide-23 02/12/1999  . Zoster 11/24/2005   Past Surgical  History  Procedure Laterality Date  . Tonsillectomy    . Oophorectomy    . Bunionectomy      Bil  . Colon surgery      colonoscopy  . Hemorrhoid surgery    . Breast enhancement surgery     FHx:    Reviewed / unchanged  SHx:    Reviewed / unchanged  Systems Review:  Constitutional: Denies fever, chills, wt changes, headaches, insomnia, fatigue, night sweats, change in appetite. Eyes: Denies redness, blurred vision, diplopia, discharge, itchy, watery eyes.  ENT: Denies discharge, congestion, post nasal drip, epistaxis, sore throat, earache, hearing loss, dental pain, tinnitus, vertigo, sinus pain, snoring.  CV: Denies chest pain, palpitations, irregular heartbeat, syncope, dyspnea, diaphoresis, orthopnea, PND, claudication or edema. Respiratory: denies cough, dyspnea, DOE, pleurisy, hoarseness, laryngitis,  wheezing.  Gastrointestinal: Denies dysphagia, odynophagia, heartburn, reflux, water brash, abdominal pain or cramps, nausea, vomiting, bloating, diarrhea, constipation, hematemesis, melena, hematochezia  or hemorrhoids. Genitourinary: Denies frequency,  nocturia, hesitancy, discharge, hematuria or flank pain. (+) c/o dysuria & urgency. Musculoskeletal: Denies arthralgias, myalgias, stiffness, jt. swelling, pain, limping or strain/sprain.  Skin: Denies pruritus, rash, hives, warts, acne, eczema or change in skin lesion(s). Neuro: No weakness, tremor, incoordination, spasms, paresthesia or pain. Psychiatric: Denies confusion, memory loss or sensory loss. Endo: Denies change in weight, skin or hair change.  Heme/Lymph: No excessive bleeding, bruising or enlarged lymph nodes.  Physical Exam  BP 112/76   Pulse 68  Temp 97.9 F  Resp 16  Ht 5' 3.5"   Wt 172 lb 12.8 oz     BMI 30.13   Appears well nourished and in no distress. Eyes: PERRLA, EOMs, conjunctiva no swelling or erythema. Sinuses: No frontal/maxillary tenderness ENT/Mouth: EAC's clear, TM's nl w/o erythema, bulging. Nares clear w/o erythema, swelling, exudates. Oropharynx clear without erythema or exudates. Oral hygiene is good. Tongue normal, non obstructing. Hearing intact.  Neck: Supple. Thyroid nl. Car 2+/2+ without bruits, nodes or JVD. Chest: Respirations nl with BS clear & equal w/o rales, rhonchi, wheezing or stridor.  Cor: Heart sounds normal w/ regular rate and rhythm without sig. murmurs, gallops, clicks, or rubs. Peripheral pulses normal and equal  without edema.  Abdomen: Soft & bowel sounds normal. Non-tender w/o guarding, rebound, hernias, masses, or organomegaly.  Lymphatics: Unremarkable.  Musculoskeletal: Full ROM all peripheral extremities, joint stability, 5/5 strength, and normal gait.  Skin: Warm, dry without exposed rashes, lesions or ecchymosis apparent.  Neuro: Cranial nerves intact, reflexes equal  bilaterally. Sensory-motor testing grossly intact. Tendon reflexes grossly intact.  Pysch: Alert & oriented x 3.  Insight and judgement nl & appropriate. No ideations.  Assessment and Plan:  1. Essential hypertension  - TSH  2. Prediabetes  - Hemoglobin A1c - Insulin, fasting  3. Mixed hyperlipidemia  - Lipid panel  4. Vitamin D deficiency  - Vit D  25 hydroxy (rtn osteoporosis monitoring)  5. Attention deficit hyperactivity disorder (ADHD), predominantly inattentive type   6. Medication management  - CBC with Differential/Platelet - BASIC METABOLIC PANEL WITH GFR - Hepatic function panel - Magnesium  7. Dysuria  - Urine Microscopic - Urine culture   Recommended regular exercise, BP monitoring, weight control, and discussed med and SE's. Recommended labs to assess and monitor clinical status. Further disposition pending results of labs.

## 2014-06-12 NOTE — Patient Instructions (Signed)

## 2014-06-13 LAB — BASIC METABOLIC PANEL WITH GFR
BUN: 29 mg/dL — AB (ref 6–23)
CO2: 30 mEq/L (ref 19–32)
Calcium: 9 mg/dL (ref 8.4–10.5)
Chloride: 102 mEq/L (ref 96–112)
Creat: 0.96 mg/dL (ref 0.50–1.10)
GFR, EST AFRICAN AMERICAN: 73 mL/min
GFR, Est Non African American: 64 mL/min
Glucose, Bld: 95 mg/dL (ref 70–99)
Potassium: 4.2 mEq/L (ref 3.5–5.3)
Sodium: 140 mEq/L (ref 135–145)

## 2014-06-13 LAB — URINALYSIS, MICROSCOPIC ONLY
CRYSTALS: NONE SEEN
Casts: NONE SEEN
Squamous Epithelial / LPF: NONE SEEN

## 2014-06-13 LAB — LIPID PANEL
CHOL/HDL RATIO: 2.9 ratio
Cholesterol: 183 mg/dL (ref 0–200)
HDL: 64 mg/dL (ref >39)
LDL Cholesterol: 100 mg/dL — ABNORMAL HIGH (ref 0–99)
Triglycerides: 94 mg/dL (ref <150)
VLDL: 19 mg/dL (ref 0–40)

## 2014-06-13 LAB — VITAMIN D 25 HYDROXY (VIT D DEFICIENCY, FRACTURES): Vit D, 25-Hydroxy: 55 ng/mL (ref 30–100)

## 2014-06-13 LAB — INSULIN, FASTING: Insulin fasting, serum: 3.3 u[IU]/mL (ref 2.0–19.6)

## 2014-06-13 LAB — HEPATIC FUNCTION PANEL
ALBUMIN: 3.7 g/dL (ref 3.5–5.2)
ALT: 16 U/L (ref 0–35)
AST: 18 U/L (ref 0–37)
Alkaline Phosphatase: 67 U/L (ref 39–117)
BILIRUBIN DIRECT: 0.1 mg/dL (ref 0.0–0.3)
BILIRUBIN TOTAL: 0.3 mg/dL (ref 0.2–1.2)
Indirect Bilirubin: 0.2 mg/dL (ref 0.2–1.2)
TOTAL PROTEIN: 6.4 g/dL (ref 6.0–8.3)

## 2014-06-13 LAB — MAGNESIUM: MAGNESIUM: 2.1 mg/dL (ref 1.5–2.5)

## 2014-06-13 LAB — TSH: TSH: 0.677 u[IU]/mL (ref 0.350–4.500)

## 2014-06-14 LAB — URINE CULTURE: Colony Count: 3000

## 2014-06-15 ENCOUNTER — Telehealth: Payer: Self-pay | Admitting: Internal Medicine

## 2014-07-16 NOTE — Telephone Encounter (Signed)
documented

## 2014-10-02 ENCOUNTER — Ambulatory Visit (INDEPENDENT_AMBULATORY_CARE_PROVIDER_SITE_OTHER): Payer: 59 | Admitting: Physician Assistant

## 2014-10-02 ENCOUNTER — Encounter: Payer: Self-pay | Admitting: Physician Assistant

## 2014-10-02 ENCOUNTER — Other Ambulatory Visit: Payer: Self-pay

## 2014-10-02 VITALS — BP 130/78 | HR 68 | Temp 98.6°F | Resp 16 | Ht 63.5 in | Wt 176.0 lb

## 2014-10-02 DIAGNOSIS — E559 Vitamin D deficiency, unspecified: Secondary | ICD-10-CM

## 2014-10-02 DIAGNOSIS — E782 Mixed hyperlipidemia: Secondary | ICD-10-CM

## 2014-10-02 DIAGNOSIS — I10 Essential (primary) hypertension: Secondary | ICD-10-CM

## 2014-10-02 DIAGNOSIS — E039 Hypothyroidism, unspecified: Secondary | ICD-10-CM

## 2014-10-02 DIAGNOSIS — H6122 Impacted cerumen, left ear: Secondary | ICD-10-CM

## 2014-10-02 DIAGNOSIS — R7303 Prediabetes: Secondary | ICD-10-CM

## 2014-10-02 DIAGNOSIS — Z79899 Other long term (current) drug therapy: Secondary | ICD-10-CM

## 2014-10-02 DIAGNOSIS — R7309 Other abnormal glucose: Secondary | ICD-10-CM

## 2014-10-02 LAB — TSH: TSH: 0.652 u[IU]/mL (ref 0.350–4.500)

## 2014-10-02 LAB — BASIC METABOLIC PANEL WITH GFR
BUN: 25 mg/dL — ABNORMAL HIGH (ref 6–23)
CALCIUM: 9.4 mg/dL (ref 8.4–10.5)
CHLORIDE: 102 meq/L (ref 96–112)
CO2: 28 meq/L (ref 19–32)
Creat: 1.07 mg/dL (ref 0.50–1.10)
GFR, EST NON AFRICAN AMERICAN: 55 mL/min — AB
GFR, Est African American: 64 mL/min
GLUCOSE: 80 mg/dL (ref 70–99)
POTASSIUM: 4 meq/L (ref 3.5–5.3)
Sodium: 137 mEq/L (ref 135–145)

## 2014-10-02 LAB — HEPATIC FUNCTION PANEL
ALT: 19 U/L (ref 0–35)
AST: 22 U/L (ref 0–37)
Albumin: 3.5 g/dL (ref 3.5–5.2)
Alkaline Phosphatase: 63 U/L (ref 39–117)
BILIRUBIN TOTAL: 0.5 mg/dL (ref 0.2–1.2)
Bilirubin, Direct: 0.1 mg/dL (ref 0.0–0.3)
Indirect Bilirubin: 0.4 mg/dL (ref 0.2–1.2)
Total Protein: 6.6 g/dL (ref 6.0–8.3)

## 2014-10-02 LAB — CBC WITH DIFFERENTIAL/PLATELET
Basophils Absolute: 0.1 10*3/uL (ref 0.0–0.1)
Basophils Relative: 1 % (ref 0–1)
EOS ABS: 0.3 10*3/uL (ref 0.0–0.7)
Eosinophils Relative: 5 % (ref 0–5)
HEMATOCRIT: 39.9 % (ref 36.0–46.0)
HEMOGLOBIN: 13.3 g/dL (ref 12.0–15.0)
Lymphocytes Relative: 39 % (ref 12–46)
Lymphs Abs: 2.5 10*3/uL (ref 0.7–4.0)
MCH: 30.6 pg (ref 26.0–34.0)
MCHC: 33.3 g/dL (ref 30.0–36.0)
MCV: 91.7 fL (ref 78.0–100.0)
MONOS PCT: 8 % (ref 3–12)
MPV: 10.1 fL (ref 8.6–12.4)
Monocytes Absolute: 0.5 10*3/uL (ref 0.1–1.0)
NEUTROS ABS: 3 10*3/uL (ref 1.7–7.7)
NEUTROS PCT: 47 % (ref 43–77)
PLATELETS: 293 10*3/uL (ref 150–400)
RBC: 4.35 MIL/uL (ref 3.87–5.11)
RDW: 13.9 % (ref 11.5–15.5)
WBC: 6.4 10*3/uL (ref 4.0–10.5)

## 2014-10-02 LAB — MAGNESIUM: MAGNESIUM: 2.1 mg/dL (ref 1.5–2.5)

## 2014-10-02 LAB — LIPID PANEL
CHOLESTEROL: 150 mg/dL (ref 0–200)
HDL: 56 mg/dL (ref >=46)
LDL CALC: 86 mg/dL (ref 0–99)
Total CHOL/HDL Ratio: 2.7 Ratio
Triglycerides: 41 mg/dL (ref <150)
VLDL: 8 mg/dL (ref 0–40)

## 2014-10-02 LAB — HEMOGLOBIN A1C
HEMOGLOBIN A1C: 5.6 % (ref <5.7)
Mean Plasma Glucose: 114 mg/dL (ref <117)

## 2014-10-02 MED ORDER — METHYLPHENIDATE HCL 20 MG PO TABS
ORAL_TABLET | ORAL | Status: DC
Start: 2014-10-02 — End: 2015-01-10

## 2014-10-02 NOTE — Patient Instructions (Addendum)
Use a dropper or use a cap to put olive oil,mineral oil or canola oil in the effected ear- 2-3 times a week. Let it soak for 20-30 min then you can take a shower or use a baby bulb with warm water to wash out the ear wax.  Do not use Qtips  VAGINAL DRYNESS OVERVIEW  Vaginal dryness, also known as atrophic vaginitis, is a common condition in postmenopausal women. This condition is also common in women who have had both ovaries removed at the time of hysterectomy.   Some women have uncomfortable symptoms of vaginal dryness, such as pain with sex, burning vaginal discomfort or itching, or abnormal vaginal discharge, while others have no symptoms at all.  VAGINAL DRYNESS CAUSES   Estrogen helps to keep the vagina moist and to maintain thickness of the vaginal lining. Vaginal dryness occurs when the ovaries produce a decreased amount of estrogen. This can occur at certain times in a woman's life, and may be permanent or temporary. Times when less estrogen is made include: ?At the time of menopause. ?After surgical removal of the ovaries, chemotherapy, or radiation therapy of the pelvis for cancer. ?After having a baby, particularly in women who breastfeed. ?While using certain medications, such as danazol, medroxyprogesterone (brand names: Provera or DepoProvera), leuprolide (brand name: Lupron), or nafarelin. When these medications are stopped, estrogen production resumes.  Women who smoke cigarettes have been shown to have an increased risk of an earlier menopause transition as compared to non-smokers. Therefore, atrophic vaginitis symptoms may appear at a younger age in this population.  VAGINAL DRYNESS TREATMENT   There are three treatment options for women with vaginal dryness:  Vaginal lubricants and moisturizers - Vaginal lubricants and moisturizers can be purchased without a prescription. These products do not contain any hormones and have virtually no side effects. - Albolene is found in the  facial cleanser section at [facility], [facility], or [employer]. It is a large jar with a blue top. This is the best lubricant for women because it is hypoallergenic. -Natural lubricants, such as olive, avocado or peanut oil, are easily available products that may be used as a lubricant with sex.  -Vaginal moisturizes (eg, Replens, Moist Again, Vagisil, K-Y Silk-E, and Feminease) are formulated to allow water to be retained in the vaginal tissues. Moisturizers are applied into the vagina three times weekly to allow a continued moisturizing effect. These should not be used just before having sex, as they can be irritating.  Vaginal estrogen - Vaginal estrogen is the most effective treatment option for women with vaginal dryness. Vaginal estrogen must be prescribed by a healthcare provider. Very low doses of vaginal estrogen can be used when it is put into the vagina to treat vaginal dryness. A small amount of estrogen is absorbed into the bloodstream, but only about 100 times less than when using estrogen pills or tablets. As a result, there is a much lower risk of side effects, such as blood clots, breast cancer, and heart attack, compared with other estrogen-containing products (birth control pills, menopausal hormone therapy).   Ospemifene - Ospemifene is a prescription medication that is similar to estrogen, but is not estrogen. In the vaginal tissue, it acts similarly to estrogen. In the breast tissue, it acts as an estrogen blocker. It comes in a pill, and is prescribed for women who want to use an estrogen-like medication for vaginal dryness or painful sex associated with vaginal dryness, but prefer not to use a vaginal medication. The medication may  cause hot flashes as a side effect. This type of medication may increase the risk of blood clots or uterine cancer. Further study of ospemifene is needed to evaluate the risk of these complications. This medication has not been tested in women who have had breast  cancer or are at a high risk of developing breast cancer.    Sexual activity - Vaginal estrogen improves vaginal dryness quickly, usually within a few weeks. You may continue to have sex as you treat vaginal dryness because sex itself can help to keep the vaginal tissues healthy. Vaginal intercourse may help the vaginal tissues by keeping them soft and stretchable and preventing the tissues from shrinking.  If sex continues to be painful despite treatment for vaginal dryness, talk to your healthcare provider.   About Cystocele  Overview  The pelvic organs, including the bladder, are normally supported by pelvic floor muscles and ligaments.  When these muscles and ligaments are stretched, weakened or torn, the wall between the bladder and the vagina sags or herniates causing a prolapse, sometimes called a cystocele.  This condition may cause discomfort and problems with emptying the bladder.  It can be present in various stages.  Some people are not aware of the changes.  Others may notice changes at the vaginal opening or a feeling of the bladder dropping outside the body.  Causes of a Cystocele  A cystocele is usually caused by muscle straining or stretching during childbirth.  In addition, cystocele is more common after menopause, because the hormone estrogen helps keep the elastic tissues around the pelvic organs strong.  A cystocele is more likely to occur when levels of estrogen decrease.  Other causes include: heavy lifting, chronic coughing, previous pelvic surgery and obesity.  Symptoms  A bladder that has dropped from its normal position may cause: unwanted urine leakage (stress incontinence), frequent urination or urge to urinate, incomplete emptying of the bladder (not feeling bladder relief after emptying), pain or discomfort in the vagina, pelvis, groin, lower back or lower abdomen and frequent urinary tract infections.  Mild cases may not cause any symptoms.  Treatment  Options  Pelvic floor (Kegel) exercises:  Strength training the muscles in your genital area  Behavioral changes: Treating and preventing constipation, taking time to empty your bladder properly, learning to lift properly and/or avoid heavy lifting when possible, stopping smoking, avoiding weight gain and treating a chronic cough or bronchitis.  A pessary: A vaginal support device is sometimes used to help pelvic support caused by muscle and ligament changes.  Surgery: Surgical repair may be necessary if symptoms cannot be managed with exercise, behavioral changes and a pessary.  Surgery is usually considered for severe cases.   2007, [employer]

## 2014-10-02 NOTE — Progress Notes (Signed)
Assessment and Plan:  1. Hypertension -Continue medication, monitor blood pressure at home. Continue DASH diet.  Reminder to go to the ER if any CP, SOB, nausea, dizziness, severe HA, changes vision/speech, left arm numbness and tingling and jaw pain.  2. Cholesterol -Continue diet and exercise. Check cholesterol.   3. Prediabetes  -Continue diet and exercise. Check A1C  4. Vitamin D Def - check level and continue medications.   5. Hypothyroidism -check TSH level, continue medications the same, reminded to take on an empty stomach 30-49mins before food.   6. Cerumen impaction - stop using Qtips, irrigation used in the office without complications, use OTC drops/oil at home to prevent reoccurence   Future Appointments Date Time Provider [place]  01/10/2015 11:00 AM Unk [name], MD [facility] None    Continue diet and meds as discussed. Further disposition pending results of labs. Over 30 minutes of exam, counseling, chart review, and critical decision making was performed  HPI 64 y.o. female  presents for 4 month follow up on hypertension, cholesterol, prediabetes, and vitamin D deficiency.   Her blood pressure has been controlled at home, today their BP is BP: 130/78 mmHg  She does not workout. She denies chest pain, shortness of breath, dizziness.  She is not on cholesterol medication and denies myalgias. Her cholesterol is at goal. The cholesterol last visit was:   Lab Results  Component Value Date   CHOL 183 06/12/2014   HDL 64 06/12/2014   LDLCALC 100* 06/12/2014   TRIG 94 06/12/2014   CHOLHDL 2.9 06/12/2014    She has been working on diet and exercise for prediabetes, and denies paresthesia of the feet, polydipsia, polyuria and visual disturbances. Last A1C in the office was:  Lab Results  Component Value Date   HGBA1C 5.5 06/12/2014   Patient is on Vitamin D supplement.   Lab Results  Component Value Date   VD25OH 55 06/12/2014     She is on  thyroid medication. Her medication was not changed last visit.   Lab Results  Component Value Date   TSH 0.677 06/12/2014   She has had negative UA x 2 times, has history of ICS, still has "bladder spasms" occ, she takes amitriptyline which is helping and will keep a food diary.   Current Medications:  Current Outpatient Prescriptions on File Prior to Visit  Medication Sig Dispense Refill  . acetaminophen (TYLENOL) 500 MG tablet Take 500 mg by mouth every 6 (six) hours as needed for pain.    Marland Kitchen ALPRAZolam (XANAX) 0.5 MG tablet Take 1/2 to 1 tablet 3 x day if needed for anxiety or sleep 90 tablet 2  . amitriptyline (ELAVIL) 25 MG tablet Take 1 to 2 tablets at bedtime for bladder spasms 180 tablet 1  . aspirin 81 MG chewable tablet Chew 81 mg by mouth daily.      Marland Kitchen atenolol (TENORMIN) 100 MG tablet TAKE 1 TABLET BY MOUTH DAILY 90 tablet 3  . B Complex-C (SUPER B COMPLEX PO) Take by mouth daily.    Marland Kitchen buPROPion (WELLBUTRIN XL) 300 MG 24 hr tablet TAKE 1 TABLET BY MOUTH DAILY 90 tablet 0  . calcium carbonate (OS-CAL) 600 MG TABS Take 600 mg by mouth daily.    . calcium carbonate (TUMS - DOSED IN MG ELEMENTAL CALCIUM) 500 MG chewable tablet Chew 1 tablet by mouth as needed. Uses Rolaids    . Cholecalciferol (VITAMIN D-3) 5000 UNITS TABS Take 1 tablet by mouth daily.    Marland Kitchen  hyoscyamine (LEVSIN SL) 0.125 MG SL tablet Place 2 tablets (0.25 mg total) under the tongue every 4 (four) hours as needed. Stomach pain and bladder spasms 100 tablet 2  . levothyroxine (SYNTHROID, LEVOTHROID) 100 MCG tablet Take 100 mcg by mouth daily. 1.5 Tue Thur only 1 rest of week    . Magnesium 250 MG TABS Take 250 mg by mouth daily.    . methylphenidate (RITALIN LA) 20 MG 24 hr capsule Take 1 capsule (20 mg total) by mouth daily. 30 capsule 0  . Multiple Vitamins-Minerals (MULTIVITAMIN WITH MINERALS) tablet Take 1 tablet by mouth daily.    . ondansetron (ZOFRAN) 8 MG tablet TAKE 1 TABLET BY MOUTH 3 TIMES A DAY AS NEEDED FOR  NAUSEA 30 tablet 3  . OSCIMIN SR 0.375 MG 12 hr tablet TAKE 1-2 TABLETS BY MOUTH TWICE DAILY 120 tablet PRN  . predniSONE (DELTASONE) 20 MG tablet 1 tab 3 x day for 3 days, then 1 tab 2 x day for 3 days, then 1 tab 1 x day for 5 days 20 tablet 0   No current facility-administered medications on file prior to visit.   Medical History:  Past Medical History  Diagnosis Date  . Hypothyroid   . Hypertension   . ADD (attention deficit disorder with hyperactivity)   . OCD (obsessive compulsive disorder)   . IBS (irritable bowel syndrome)   . Diverticulosis   . Irregular heart rate     left bundle branch block  . Helicobacter pylori (H. pylori)   . History of IBS   . Depression   . Prediabetes   . Migraines   . Vitamin D deficiency    Allergies:  Allergies  Allergen Reactions  . Ace Inhibitors     Acute renal failure  . Cephalexin Anaphylaxis  . Codeine Nausea Only  . Hydrochlorothiazide   . Macrobid [Nitrofurantoin]   . Sulfa Antibiotics Rash    Review of Systems:  Review of Systems  Constitutional: Negative.   HENT: Negative.   Respiratory: Positive for shortness of breath (occ SOB, no accompaniments ). Negative for cough, hemoptysis, sputum production and wheezing.   Cardiovascular: Negative for chest pain, palpitations, orthopnea, claudication, leg swelling and PND.  Gastrointestinal: Negative.   Genitourinary: Positive for dysuria, urgency and frequency. Negative for hematuria and flank pain.  Musculoskeletal: Positive for joint pain (right 1st MCP). Negative for myalgias, back pain, falls and neck pain.  Skin: Negative.   Neurological: Negative.   Psychiatric/Behavioral: Negative.     Family history- Review and unchanged Social history- Review and unchanged Physical Exam: BP 130/78 mmHg  Pulse 68  Temp(Src) 98.6 F (37 C)  Resp 16  Ht 5' 3.5" (1.613 m)  Wt 176 lb (79.833 kg)  BMI 30.68 kg/m2 Wt Readings from Last 3 Encounters:  10/02/14 176 lb (79.833 kg)   06/12/14 172 lb 12.8 oz (78.382 kg)  04/02/14 170 lb (77.111 kg)   General Appearance: Well nourished, in no apparent distress. Eyes: PERRLA, EOMs, conjunctiva no swelling or erythema Sinuses: No Frontal/maxillary tenderness ENT/Mouth: Ext aud canals clear, left with cerumen impaction TMs without erythema, bulging. No erythema, swelling, or exudate on post pharynx.  Tonsils not swollen or erythematous. Hearing normal.  Neck: Supple, thyroid normal.  Respiratory: Respiratory effort normal, BS equal bilaterally without rales, rhonchi, wheezing or stridor.  Cardio: RRR with no MRGs. Brisk peripheral pulses without edema.  Abdomen: Soft, + BS,  Non tender, no guarding, rebound, hernias, masses. Lymphatics: Non tender without lymphadenopathy.  Musculoskeletal: Full ROM, 5/5 strength, Normal gait Skin: Warm, dry without rashes, lesions, ecchymosis.  Neuro: Cranial nerves intact. Normal muscle tone, no cerebellar symptoms. Psych: Awake and oriented X 3, normal affect, Insight and Judgment appropriate.    Vicie Mutters, PA-C 8:47 AM [facility]

## 2014-10-03 LAB — VITAMIN D 25 HYDROXY (VIT D DEFICIENCY, FRACTURES): VIT D 25 HYDROXY: 58 ng/mL (ref 30–100)

## 2014-10-08 ENCOUNTER — Other Ambulatory Visit: Payer: Self-pay | Admitting: Internal Medicine

## 2014-11-19 ENCOUNTER — Encounter: Payer: Self-pay | Admitting: Internal Medicine

## 2014-12-28 ENCOUNTER — Other Ambulatory Visit: Payer: Self-pay | Admitting: Internal Medicine

## 2015-01-10 ENCOUNTER — Encounter: Payer: Self-pay | Admitting: Internal Medicine

## 2015-01-10 ENCOUNTER — Other Ambulatory Visit: Payer: Self-pay | Admitting: *Deleted

## 2015-01-10 ENCOUNTER — Ambulatory Visit (INDEPENDENT_AMBULATORY_CARE_PROVIDER_SITE_OTHER): Payer: 59 | Admitting: Internal Medicine

## 2015-01-10 VITALS — BP 126/82 | HR 56 | Temp 97.5°F | Resp 16 | Ht 63.0 in | Wt 164.6 lb

## 2015-01-10 DIAGNOSIS — I10 Essential (primary) hypertension: Secondary | ICD-10-CM | POA: Diagnosis not present

## 2015-01-10 DIAGNOSIS — R6889 Other general symptoms and signs: Secondary | ICD-10-CM | POA: Diagnosis not present

## 2015-01-10 DIAGNOSIS — I447 Left bundle-branch block, unspecified: Secondary | ICD-10-CM

## 2015-01-10 DIAGNOSIS — N183 Chronic kidney disease, stage 3 (moderate): Secondary | ICD-10-CM | POA: Diagnosis not present

## 2015-01-10 DIAGNOSIS — Z6821 Body mass index (BMI) 21.0-21.9, adult: Secondary | ICD-10-CM

## 2015-01-10 DIAGNOSIS — R7309 Other abnormal glucose: Secondary | ICD-10-CM | POA: Diagnosis not present

## 2015-01-10 DIAGNOSIS — E782 Mixed hyperlipidemia: Secondary | ICD-10-CM

## 2015-01-10 DIAGNOSIS — Z0000 Encounter for general adult medical examination without abnormal findings: Secondary | ICD-10-CM

## 2015-01-10 DIAGNOSIS — Z0001 Encounter for general adult medical examination with abnormal findings: Secondary | ICD-10-CM

## 2015-01-10 DIAGNOSIS — Z79899 Other long term (current) drug therapy: Secondary | ICD-10-CM | POA: Diagnosis not present

## 2015-01-10 DIAGNOSIS — N1831 Chronic kidney disease, stage 3a: Secondary | ICD-10-CM | POA: Insufficient documentation

## 2015-01-10 DIAGNOSIS — F900 Attention-deficit hyperactivity disorder, predominantly inattentive type: Secondary | ICD-10-CM | POA: Diagnosis not present

## 2015-01-10 DIAGNOSIS — K589 Irritable bowel syndrome without diarrhea: Secondary | ICD-10-CM

## 2015-01-10 DIAGNOSIS — R5383 Other fatigue: Secondary | ICD-10-CM

## 2015-01-10 DIAGNOSIS — Z1212 Encounter for screening for malignant neoplasm of rectum: Secondary | ICD-10-CM

## 2015-01-10 DIAGNOSIS — E559 Vitamin D deficiency, unspecified: Secondary | ICD-10-CM

## 2015-01-10 DIAGNOSIS — R7303 Prediabetes: Secondary | ICD-10-CM

## 2015-01-10 LAB — CBC WITH DIFFERENTIAL/PLATELET
Basophils Absolute: 0.1 10*3/uL (ref 0.0–0.1)
Basophils Relative: 1 % (ref 0–1)
EOS ABS: 0.3 10*3/uL (ref 0.0–0.7)
Eosinophils Relative: 6 % — ABNORMAL HIGH (ref 0–5)
HCT: 38.4 % (ref 36.0–46.0)
HEMOGLOBIN: 13.2 g/dL (ref 12.0–15.0)
LYMPHS ABS: 2.4 10*3/uL (ref 0.7–4.0)
Lymphocytes Relative: 43 % (ref 12–46)
MCH: 31.4 pg (ref 26.0–34.0)
MCHC: 34.4 g/dL (ref 30.0–36.0)
MCV: 91.4 fL (ref 78.0–100.0)
MONOS PCT: 9 % (ref 3–12)
MPV: 9.9 fL (ref 8.6–12.4)
Monocytes Absolute: 0.5 10*3/uL (ref 0.1–1.0)
NEUTROS PCT: 41 % — AB (ref 43–77)
Neutro Abs: 2.3 10*3/uL (ref 1.7–7.7)
PLATELETS: 258 10*3/uL (ref 150–400)
RBC: 4.2 MIL/uL (ref 3.87–5.11)
RDW: 14.7 % (ref 11.5–15.5)
WBC: 5.6 10*3/uL (ref 4.0–10.5)

## 2015-01-10 LAB — POC HEMOCCULT BLD/STL (HOME/3-CARD/SCREEN)
Card #2 Fecal Occult Blod, POC: NEGATIVE
Card #3 Fecal Occult Blood, POC: NEGATIVE
Fecal Occult Blood, POC: NEGATIVE

## 2015-01-10 LAB — IRON AND TIBC
%SAT: 26 % (ref 11–50)
IRON: 94 ug/dL (ref 45–160)
TIBC: 356 ug/dL (ref 250–450)
UIBC: 262 ug/dL (ref 125–400)

## 2015-01-10 LAB — LIPID PANEL
Cholesterol: 145 mg/dL (ref 125–200)
HDL: 56 mg/dL (ref >=46)
LDL Cholesterol: 79 mg/dL (ref <130)
Total CHOL/HDL Ratio: 2.6 Ratio (ref <=5.0)
Triglycerides: 48 mg/dL (ref <150)
VLDL: 10 mg/dL (ref <30)

## 2015-01-10 LAB — BASIC METABOLIC PANEL WITH GFR
BUN: 19 mg/dL (ref 7–25)
CHLORIDE: 103 mmol/L (ref 98–110)
CO2: 30 mmol/L (ref 20–31)
CREATININE: 1.08 mg/dL — AB (ref 0.50–0.99)
Calcium: 9.4 mg/dL (ref 8.6–10.4)
GFR, Est African American: 63 mL/min (ref >=60)
GFR, Est Non African American: 55 mL/min — ABNORMAL LOW (ref >=60)
Glucose, Bld: 86 mg/dL (ref 65–99)
Potassium: 4 mmol/L (ref 3.5–5.3)
Sodium: 142 mmol/L (ref 135–146)

## 2015-01-10 LAB — HEPATIC FUNCTION PANEL
ALBUMIN: 3.6 g/dL (ref 3.6–5.1)
ALT: 20 U/L (ref 6–29)
AST: 23 U/L (ref 10–35)
Alkaline Phosphatase: 64 U/L (ref 33–130)
Bilirubin, Direct: 0.1 mg/dL (ref <=0.2)
Indirect Bilirubin: 0.4 mg/dL (ref 0.2–1.2)
TOTAL PROTEIN: 6.2 g/dL (ref 6.1–8.1)
Total Bilirubin: 0.5 mg/dL (ref 0.2–1.2)

## 2015-01-10 LAB — MAGNESIUM: MAGNESIUM: 2.1 mg/dL (ref 1.5–2.5)

## 2015-01-10 MED ORDER — METHYLPHENIDATE HCL 20 MG PO TABS: ORAL_TABLET | ORAL | Status: DC

## 2015-01-10 NOTE — Patient Instructions (Signed)
Recommend Adult Low dose Aspirin or   coated  Aspirin 81 mg daily   To reduce risk of Colon Cancer 20 %,   Skin Cancer 26 % ,   Melanoma 46%   and   Pancreatic cancer 60%  ++++++++++++++++++  Vitamin D goal   is between 70-100.   Please make sure that you are taking your Vitamin D as directed.   It is very important as a natural anti-inflammatory   helping hair, skin, and nails, as well as reducing stroke and heart attack risk.   It helps your bones and helps with mood.  It also decreases numerous cancer risks so please take it as directed.   Low Vit D is associated with a 200-300% higher risk for CANCER   and 200-300% higher risk for HEART   ATTACK  &  STROKE.   ......................................  It is also associated with higher death rate at younger ages,   autoimmune diseases like Rheumatoid arthritis, Lupus, Multiple Sclerosis.     Also many other serious conditions, like depression, Alzheimer's  Dementia, infertility, muscle aches, fatigue, fibromyalgia - just to name a few.  +++++++++++++++++++  Recommend the book "The END of DIETING" by Dr Joel Fuhrman   & the book "The END of DIABETES " by Dr Joel Fuhrman  At [employer] - get book & Audio CD's     Being diabetic has a  300% increased risk for heart attack, stroke, cancer, and alzheimer- type vascular dementia. It is very important that you work harder with diet by avoiding all foods that are white. Avoid white rice (brown & wild rice is OK), white potatoes (sweetpotatoes in moderation is OK), White bread or wheat bread or anything made out of white flour like bagels, donuts, rolls, buns, biscuits, cakes, pastries, cookies, pizza crust, and pasta (made from white flour & egg whites) - vegetarian pasta or spinach or wheat pasta is OK. Multigrain breads like Arnold's or [employer], or multigrain sandwich thins or flatbreads.  Diet, exercise and weight loss can reverse and cure diabetes in the early  stages.  Diet, exercise and weight loss is very important in the control and prevention of complications of diabetes which affects every system in your body, ie. Brain - dementia/stroke, eyes - glaucoma/blindness, heart - heart attack/heart failure, kidneys - dialysis, stomach - gastric paralysis, intestines - malabsorption, nerves - severe painful neuritis, circulation - gangrene & loss of a leg(s), and finally cancer and Alzheimers.    I recommend avoid fried & greasy foods,  sweets/candy, white rice (brown or wild rice or Quinoa is OK), white potatoes (sweet potatoes are OK) - anything made from white flour - bagels, doughnuts, rolls, buns, biscuits,white and wheat breads, pizza crust and traditional pasta made of white flour & egg white(vegetarian pasta or spinach or wheat pasta is OK).  Multi-grain bread is OK - like multi-grain flat bread or sandwich thins. Avoid alcohol in excess. Exercise is also important.    Eat all the vegetables you want - avoid meat, especially red meat and dairy - especially cheese.  Cheese is the most concentrated form of trans-fats which is the worst thing to clog up our arteries. Veggie cheese is OK which can be found in the fresh produce section at [employer] or [employer] or [employer]  ++++++++++++++++++++++++++   Preventive Care for Adults  A healthy lifestyle and preventive care can promote health and wellness. Preventive health guidelines for women include the following key practices.  A routine   yearly physical is a good way to check with your health care provider about your health and preventive screening. It is a chance to share any concerns and updates on your health and to receive a thorough exam.  Visit your dentist for a routine exam and preventive care every 6 months. Brush your teeth twice a day and floss once a day. Good oral hygiene prevents tooth decay and gum disease.  The frequency of eye exams is based on your age, health, family medical  history, use of contact lenses, and other factors. Follow your health care provider's recommendations for frequency of eye exams.  Eat a healthy diet. Foods like vegetables, fruits, whole grains, low-fat dairy products, and lean protein foods contain the nutrients you need without too many calories. Decrease your intake of foods high in solid fats, added sugars, and salt. Eat the right amount of calories for you.Get information about a proper diet from your health care provider, if necessary.  Regular physical exercise is one of the most important things you can do for your health. Most adults should get at least 150 minutes of moderate-intensity exercise (any activity that increases your heart rate and causes you to sweat) each week. In addition, most adults need muscle-strengthening exercises on 2 or more days a week.  Maintain a healthy weight. The body mass index (BMI) is a screening tool to identify possible weight problems. It provides an estimate of body fat based on height and weight. Your health care provider can find your BMI and can help you achieve or maintain a healthy weight.For adults 20 years and older:  A BMI below 18.5 is considered underweight.  A BMI of 18.5 to 24.9 is normal.  A BMI of 25 to 29.9 is considered overweight.  A BMI of 30 and above is considered obese.  Maintain normal blood lipids and cholesterol levels by exercising and minimizing your intake of saturated fat. Eat a balanced diet with plenty of fruit and vegetables. Blood tests for lipids and cholesterol should begin at age 20 and be repeated every 5 years. If your lipid or cholesterol levels are high, you are over 50, or you are at high risk for heart disease, you may need your cholesterol levels checked more frequently.Ongoing high lipid and cholesterol levels should be treated with medicines if diet and exercise are not working.  If you smoke, find out from your health care provider how to quit. If you do  not use tobacco, do not start.  Lung cancer screening is recommended for adults aged 55-80 years who are at high risk for developing lung cancer because of a history of smoking. A yearly low-dose CT scan of the lungs is recommended for people who have at least a 30-pack-year history of smoking and are a current smoker or have quit within the past 15 years. A pack year of smoking is smoking an average of 1 pack of cigarettes a day for 1 year (for example: 1 pack a day for 30 years or 2 packs a day for 15 years). Yearly screening should continue until the smoker has stopped smoking for at least 15 years. Yearly screening should be stopped for people who develop a health problem that would prevent them from having lung cancer treatment.  High blood pressure causes heart disease and increases the risk of stroke. Your blood pressure should be checked at least every 1 to 2 years. Ongoing high blood pressure should be treated with medicines if weight loss and exercise   do not work.  If you are 55-79 years old, ask your health care provider if you should take aspirin to prevent strokes.  Diabetes screening involves taking a blood sample to check your fasting blood sugar level. This should be done once every 3 years, after age 45, if you are within normal weight and without risk factors for diabetes. Testing should be considered at a younger age or be carried out more frequently if you are overweight and have at least 1 risk factor for diabetes.  Breast cancer screening is essential preventive care for women. You should practice "breast self-awareness." This means understanding the normal appearance and feel of your breasts and may include breast self-examination. Any changes detected, no matter how small, should be reported to a health care provider. Women in their 20s and 30s should have a clinical breast exam (CBE) by a health care provider as part of a regular health exam every 1 to 3 years. After age 40, women  should have a CBE every year. Starting at age 40, women should consider having a mammogram (breast X-ray test) every year. Women who have a family history of breast cancer should talk to their health care provider about genetic screening. Women at a high risk of breast cancer should talk to their health care providers about having an MRI and a mammogram every year.  Breast cancer gene (BRCA)-related cancer risk assessment is recommended for women who have family members with BRCA-related cancers. BRCA-related cancers include breast, ovarian, tubal, and peritoneal cancers. Having family members with these cancers may be associated with an increased risk for harmful changes (mutations) in the breast cancer genes BRCA1 and BRCA2. Results of the assessment will determine the need for genetic counseling and BRCA1 and BRCA2 testing.  Routine pelvic exams to screen for cancer are no longer recommended for nonpregnant women who are considered low risk for cancer of the pelvic organs (ovaries, uterus, and vagina) and who do not have symptoms. Ask your health care provider if a screening pelvic exam is right for you.  If you have had past treatment for cervical cancer or a condition that could lead to cancer, you need Pap tests and screening for cancer for at least 20 years after your treatment. If Pap tests have been discontinued, your risk factors (such as having a new sexual partner) need to be reassessed to determine if screening should be resumed. Some women have medical problems that increase the chance of getting cervical cancer. In these cases, your health care provider may recommend more frequent screening and Pap tests.  Colorectal cancer can be detected and often prevented. Most routine colorectal cancer screening begins at the age of 50 years and continues through age 75 years. However, your health care provider may recommend screening at an earlier age if you have risk factors for colon cancer. On a  yearly basis, your health care provider may provide home test kits to check for hidden blood in the stool. Use of a small camera at the end of a tube, to directly examine the colon (sigmoidoscopy or colonoscopy), can detect the earliest forms of colorectal cancer. Talk to your health care provider about this at age 50, when routine screening begins. Direct exam of the colon should be repeated every 5-10 years through age 75 years, unless early forms of pre-cancerous polyps or small growths are found.  Hepatitis C blood testing is recommended for all people born from 1945 through 1965 and any individual with known risks for   hepatitis C.  Pra  Osteoporosis is a disease in which the bones lose minerals and strength with aging. This can result in serious bone fractures or breaks. The risk of osteoporosis can be identified using a bone density scan. Women ages 65 years and over and women at risk for fractures or osteoporosis should discuss screening with their health care providers. Ask your health care provider whether you should take a calcium supplement or vitamin D to reduce the rate of osteoporosis.  Menopause can be associated with physical symptoms and risks. Hormone replacement therapy is available to decrease symptoms and risks. You should talk to your health care provider about whether hormone replacement therapy is right for you.  Use sunscreen. Apply sunscreen liberally and repeatedly throughout the day. You should seek shade when your shadow is shorter than you. Protect yourself by wearing long sleeves, pants, a wide-brimmed hat, and sunglasses year round, whenever you are outdoors.  Once a month, do a whole body skin exam, using a mirror to look at the skin on your back. Tell your health care provider of new moles, moles that have irregular borders, moles that are larger than a pencil eraser, or moles that have changed in shape or color.  Stay current with required vaccines  (immunizations).  Influenza vaccine. All adults should be immunized every year.  Tetanus, diphtheria, and acellular pertussis (Td, Tdap) vaccine. Pregnant women should receive 1 dose of Tdap vaccine during each pregnancy. The dose should be obtained regardless of the length of time since the last dose. Immunization is preferred during the 27th-36th week of gestation. An adult who has not previously received Tdap or who does not know her vaccine status should receive 1 dose of Tdap. This initial dose should be followed by tetanus and diphtheria toxoids (Td) booster doses every 10 years. Adults with an unknown or incomplete history of completing a 3-dose immunization series with Td-containing vaccines should begin or complete a primary immunization series including a Tdap dose. Adults should receive a Td booster every 10 years.  Varicella vaccine. An adult without evidence of immunity to varicella should receive 2 doses or a second dose if she has previously received 1 dose. Pregnant females who do not have evidence of immunity should receive the first dose after pregnancy. This first dose should be obtained before leaving the health care facility. The second dose should be obtained 4-8 weeks after the first dose.  Human papillomavirus (HPV) vaccine. Females aged 13-26 years who have not received the vaccine previously should obtain the 3-dose series. The vaccine is not recommended for use in pregnant females. However, pregnancy testing is not needed before receiving a dose. If a female is found to be pregnant after receiving a dose, no treatment is needed. In that case, the remaining doses should be delayed until after the pregnancy. Immunization is recommended for any person with an immunocompromised condition through the age of 26 years if she did not get any or all doses earlier. During the 3-dose series, the second dose should be obtained 4-8 weeks after the first dose. The third dose should be obtained  24 weeks after the first dose and 16 weeks after the second dose.  Zoster vaccine. One dose is recommended for adults aged 60 years or older unless certain conditions are present.  Measles, mumps, and rubella (MMR) vaccine. Adults born before 1957 generally are considered immune to measles and mumps. Adults born in 1957 or later should have 1 or more doses of MMR   vaccine unless there is a contraindication to the vaccine or there is laboratory evidence of immunity to each of the three diseases. A routine second dose of MMR vaccine should be obtained at least 28 days after the first dose for students attending postsecondary schools, health care workers, or international travelers. People who received inactivated measles vaccine or an unknown type of measles vaccine during 1963-1967 should receive 2 doses of MMR vaccine. People who received inactivated mumps vaccine or an unknown type of mumps vaccine before 1979 and are at high risk for mumps infection should consider immunization with 2 doses of MMR vaccine. For females of childbearing age, rubella immunity should be determined. If there is no evidence of immunity, females who are not pregnant should be vaccinated. If there is no evidence of immunity, females who are pregnant should delay immunization until after pregnancy. Unvaccinated health care workers born before 1957 who lack laboratory evidence of measles, mumps, or rubella immunity or laboratory confirmation of disease should consider measles and mumps immunization with 2 doses of MMR vaccine or rubella immunization with 1 dose of MMR vaccine.  Pneumococcal 13-valent conjugate (PCV13) vaccine. When indicated, a person who is uncertain of her immunization history and has no record of immunization should receive the PCV13 vaccine. An adult aged 19 years or older who has certain medical conditions and has not been previously immunized should receive 1 dose of PCV13 vaccine. This PCV13 should be followed  with a dose of pneumococcal polysaccharide (PPSV23) vaccine. The PPSV23 vaccine dose should be obtained at least 8 weeks after the dose of PCV13 vaccine. An adult aged 19 years or older who has certain medical conditions and previously received 1 or more doses of PPSV23 vaccine should receive 1 dose of PCV13. The PCV13 vaccine dose should be obtained 1 or more years after the last PPSV23 vaccine dose.    Pneumococcal polysaccharide (PPSV23) vaccine. When PCV13 is also indicated, PCV13 should be obtained first. All adults aged 65 years and older should be immunized. An adult younger than age 65 years who has certain medical conditions should be immunized. Any person who resides in a nursing home or long-term care facility should be immunized. An adult smoker should be immunized. People with an immunocompromised condition and certain other conditions should receive both PCV13 and PPSV23 vaccines. People with human immunodeficiency virus (HIV) infection should be immunized as soon as possible after diagnosis. Immunization during chemotherapy or radiation therapy should be avoided. Routine use of PPSV23 vaccine is not recommended for American Indians, [place] Natives, or people younger than 65 years unless there are medical conditions that require PPSV23 vaccine. When indicated, people who have unknown immunization and have no record of immunization should receive PPSV23 vaccine. One-time revaccination 5 years after the first dose of PPSV23 is recommended for people aged 19-64 years who have chronic kidney failure, nephrotic syndrome, asplenia, or immunocompromised conditions. People who received 1-2 doses of PPSV23 before age 65 years should receive another dose of PPSV23 vaccine at age 65 years or later if at least 5 years have passed since the previous dose. Doses of PPSV23 are not needed for people immunized with PPSV23 at or after age 65 years.  Preventive Services / Frequency   Ages 40 to 64 years  Blood  pressure check.  Lipid and cholesterol check.  Lung cancer screening. / Every year if you are aged 55-80 years and have a 30-pack-year history of smoking and currently smoke or have quit within the past 15   years. Yearly screening is stopped once you have quit smoking for at least 15 years or develop a health problem that would prevent you from having lung cancer treatment.  Clinical breast exam.** / Every year after age 40 years.  BRCA-related cancer risk assessment.** / For women who have family members with a BRCA-related cancer (breast, ovarian, tubal, or peritoneal cancers).  Mammogram.** / Every year beginning at age 40 years and continuing for as long as you are in good health. Consult with your health care provider.  Pap test.** / Every 3 years starting at age 30 years through age 65 or 70 years with a history of 3 consecutive normal Pap tests.  HPV screening.** / Every 3 years from ages 30 years through ages 65 to 70 years with a history of 3 consecutive normal Pap tests.  Fecal occult blood test (FOBT) of stool. / Every year beginning at age 50 years and continuing until age 75 years. You may not need to do this test if you get a colonoscopy every 10 years.  Flexible sigmoidoscopy or colonoscopy.** / Every 5 years for a flexible sigmoidoscopy or every 10 years for a colonoscopy beginning at age 50 years and continuing until age 75 years.  Hepatitis C blood test.** / For all people born from 1945 through 1965 and any individual with known risks for hepatitis C.  Skin self-exam. / Monthly.  Influenza vaccine. / Every year.  Tetanus, diphtheria, and acellular pertussis (Tdap/Td) vaccine.** / Consult your health care provider. Pregnant women should receive 1 dose of Tdap vaccine during each pregnancy. 1 dose of Td every 10 years.  Varicella vaccine.** / Consult your health care provider. Pregnant females who do not have evidence of immunity should receive the first dose after  pregnancy.  Zoster vaccine.** / 1 dose for adults aged 60 years or older.  Pneumococcal 13-valent conjugate (PCV13) vaccine.** / Consult your health care provider.  Pneumococcal polysaccharide (PPSV23) vaccine.** / 1 to 2 doses if you smoke cigarettes or if you have certain conditions.  Meningococcal vaccine.** / Consult your health care provider.  Hepatitis A vaccine.** / Consult your health care provider.  Hepatitis B vaccine.** / Consult your health care provider. Screening for abdominal aortic aneurysm (AAA)  by ultrasound is recommended for people over 50 who have history of high blood pressure or who are current or former smokers. 

## 2015-01-10 NOTE — Progress Notes (Signed)
Patient ID: [name], female   DOB: 03/27/1951, 64 y.o.   MRN: [id number]   Wellness & Preventative Comprehensive Examination  This very nice 64 y.o. DWF presents for Wellness /Preventative Exam .  Patient has been followed for HTN, Prediabetes, Hyperlipidemia and Vitamin D Deficiency. Patient also has hx/o ADD and occasionally uses Ritalin to facilitate focusing & concentration with apparent benefit.     HTN predates since 2003. Patient's BP has been controlled at home and patient denies any cardiac symptoms as chest pain, palpitations, shortness of breath, dizziness or ankle swelling. Today's BP: 126/82 mmHg    Patient's hyperlipidemia is controlled with diet. Patient denies myalgias or other medication SE's. Last lipids were at goal -  Cholesterol 150; HDL 56; LDL 86; Triglycerides 41 on 10/02/2014.   Patient has prediabetes predating since 2011 with A1c 5.8% and 6.0% in 2012 and patient denies reactive hypoglycemic symptoms, visual blurring, diabetic polys, or paresthesias. Last A1c was back in normal range at  5.6% on  10/02/2014.   Finally, patient has history of Vitamin D Deficiency of "18" in 2008  and last Vitamin D was 58 on 10/02/2014.     Medication Sig  . acetaminophen  500 MG tablet Take every 6  hours as needed for pain.  Marland Kitchen ALPRAZolam 0.5 MG tablet Take 1/2 to 1 tablet 3 x day if needed for anxiety or sleep  . aspirin 81 MG  Chew 81 mg  daily.    . Atenolol 100 MG tablet TAKE 1 TAB DAILY  . SUPER B COMPLEX  Take  daily.  Marland Kitchen buPROPion  XL 300 MG 24 hr tablet TAKE 1 TAB  DAILY.  Marland Kitchen OS-CAL 600 MG  Take 600 mg by mouth daily.  . TUMS - 500 MG chewable tablet Chew 1 tablet by mouth as needed. Uses Rolaids  . VITAMIN D 5000 UNITS TABS Take 1 tablet by mouth daily.  . hyoscyamine  SL 0.125 MG SL tablet Place 1- 2 tablets  under the tongue every 4 hours as needed  . levothyroxine  100 MCG tablet Take 100 mcg by mouth daily. 1.5 Tue Thur only 1 rest of week  . Magnesium 250 MG TABS Take  250 mg by mouth daily.  . Multiple Vitamins-Minerals  Take 1 tablet by mouth daily.  . ondansetron  8 MG tablet TAKE 1 TAB 3 TIMES DAILY AS NEEDED FOR NAUSEA  . OSCIMIN SR 0.375 MG 12 hr tablet TAKE 1-2 TAB TWICE DAILY  . methylphenidate  20 MG tablet 1/2 to 1 tablet twice daily  . amitriptyline  25 MG tablet Take 1 to 2 tab at bedtime for bladder spasms   Allergies  Allergen Reactions  . Ace Inhibitors     Acute renal failure  . Cephalexin Anaphylaxis  . Codeine Nausea Only  . Hydrochlorothiazide   . Macrobid [Nitrofurantoin]   . Sulfa Antibiotics Rash   Past Medical History  Diagnosis Date  . OCD (obsessive compulsive disorder)   . IBS (irritable bowel syndrome)   . Diverticulosis   . Helicobacter pylori (H. pylori)   . History of IBS   . Prediabetes   . Migraines   . Vitamin D deficiency    Health Maintenance  Topic Date Due  . TETANUS/TDAP  06/29/1970  . PAP SMEAR  06/28/1972  . INFLUENZA VACCINE  11/26/2014  . MAMMOGRAM  11/10/2015  . COLONOSCOPY  11/16/2017  . ZOSTAVAX  Completed  . Hepatitis C Screening  Completed  . HIV  Screening  Completed   Immunization History  Administered Date(s) Administered  . Pneumococcal Polysaccharide-23 02/12/1999  . Zoster 11/24/2005   Past Surgical History  Procedure Laterality Date  . Tonsillectomy    . Oophorectomy    . Bunionectomy      Bil  . Colon surgery      colonoscopy  . Hemorrhoid surgery    . Breast enhancement surgery     Family History  Problem Relation Age of Onset  . Colon polyps Mother   . Colon cancer Neg Hx    Social History  Substance Use Topics  . Smoking status: Former Smoker -- 1.00 packs/day for 40 years    Types: Cigarettes    Quit date: 04/27/2002  . Smokeless tobacco: Never Used  . Alcohol Use: Yes     Comment: ocassional    ROS Constitutional: Denies fever, chills, weight loss/gain, headaches, insomnia,  night sweats, and change in appetite. Does c/o fatigue. Eyes: Denies redness,  blurred vision, diplopia, discharge, itchy, watery eyes.  ENT: Denies discharge, congestion, post nasal drip, epistaxis, sore throat, earache, hearing loss, dental pain, Tinnitus, Vertigo, Sinus pain, snoring.  Cardio: Denies chest pain, palpitations, irregular heartbeat, syncope, dyspnea, diaphoresis, orthopnea, PND, claudication, edema Respiratory: denies cough, dyspnea, DOE, pleurisy, hoarseness, laryngitis, wheezing.  Gastrointestinal: Denies dysphagia, heartburn, reflux, water brash, pain, cramps, nausea, vomiting, bloating, diarrhea, constipation, hematemesis, melena, hematochezia, jaundice, hemorrhoids Genitourinary: reports urinary urgency, occas mild incontinence and occasional end voiding dysuria.  Breast: Breast lumps, nipple discharge, bleeding.  Musculoskeletal: Denies arthralgia, myalgia, stiffness, Jt. Swelling, pain, limp, and strain/sprain. Denies falls. Skin: Denies puritis, rash, hives, warts, acne, eczema, changing in skin lesion Neuro: No weakness, tremor, incoordination, spasms, paresthesia, pain Psychiatric: Denies confusion, memory loss, sensory loss. Denies Depression. Endocrine: Denies change in weight, skin, hair change, nocturia, and paresthesia, diabetic polys, visual blurring, hyper / hypo glycemic episodes.  Heme/Lymph: No excessive bleeding, bruising, enlarged lymph nodes.  Physical Exam  BP 126/82   Pulse 56  Temp97.5 F   Resp 16  Ht 5\' 3"    Wt 164 lb 9.6 oz     BMI 29.16   General Appearance: Well nourished and in no apparent distress. Eyes: PERRLA, EOMs, conjunctiva no swelling or erythema, normal fundi and vessels. Sinuses: No frontal/maxillary tenderness ENT/Mouth: EACs patent / TMs  nl. Nares clear without erythema, swelling, mucoid exudates. Oral hygiene is good. No erythema, swelling, or exudate. Tongue normal, non-obstructing. Tonsils not swollen or erythematous. Hearing normal.  Neck: Supple, thyroid normal. No bruits, nodes or JVD. Respiratory:  Respiratory effort normal.  BS equal and clear bilateral without rales, rhonci, wheezing or stridor. Cardio: Heart sounds are normal with regular rate and rhythm and no murmurs, rubs or gallops. Peripheral pulses are normal and equal bilaterally without edema. No aortic or femoral bruits. Chest: symmetric with normal excursions and percussion. Breasts: Symmetric, palpable mobile soft implants without noted lumps, nipple discharge, retractions, or fibrocystic changes.  Abdomen: Flat, soft, with bowel sounds. Nontender, no guarding, rebound, hernias, masses, or organomegaly.  Lymphatics: Non tender without lymphadenopathy.  Genitourinary:  Musculoskeletal: Full ROM all peripheral extremities, joint stability, 5/5 strength, and normal gait. Skin: Warm and dry without rashes, lesions, cyanosis, clubbing or  ecchymosis.  Neuro: Cranial nerves intact, reflexes equal bilaterally. Normal muscle tone, no cerebellar symptoms. Sensation intact.  Pysch: Awake and oriented X 3, normal affect, Insight and Judgment appropriate.   Assessment and Plan  1. Wellness/ Preventative Visit examination   2. Essential hypertension  -  Microalbumin / creatinine urine ratio - EKG 12-Lead - [place], RETROPERITNL ABD,  LTD - Urinalysis, Routine w reflex microscopic  - TSH  3. Mixed hyperlipidemia  - Lipid panel  4. Prediabetes  - Hemoglobin A1c - Insulin, random  5. Vitamin D deficiency  - Vit D  25 hydroxy   6. Attention deficit hyperactivity disorder (ADHD), predominantly inattentive type   7. CKD Stage 3 (GFR 55 ml/min)   8. IBS    9. LBBB (left bundle branch block)   10. Medication management  - CBC with Differential/Platelet - BASIC METABOLIC PANEL WITH GFR - Hepatic function panel - Magnesium  11. Screening for rectal cancer  - POC Hemoccult Bld/Stl (3-Cd Home Screen);  12. Other fatigue  - Vitamin B12 - Iron and TIBC - TSH  13. Body mass index (BMI) of 21.0-21.9    Continue  prudent diet as discussed, weight control, BP monitoring, regular exercise, and medications. Discussed med's effects and SE's. Screening labs and tests as requested with regular follow-up as recommended.  Over 40 minutes of exam, counseling, chart review was performed.

## 2015-01-11 LAB — HEMOGLOBIN A1C
HEMOGLOBIN A1C: 5.6 % (ref <5.7)
Mean Plasma Glucose: 114 mg/dL (ref <117)

## 2015-01-11 LAB — URINALYSIS, ROUTINE W REFLEX MICROSCOPIC
BILIRUBIN URINE: NEGATIVE
GLUCOSE, UA: NEGATIVE
HGB URINE DIPSTICK: NEGATIVE
KETONES UR: NEGATIVE
LEUKOCYTES UA: NEGATIVE
Nitrite: NEGATIVE
PH: 6 (ref 5.0–8.0)
Protein, ur: NEGATIVE
Specific Gravity, Urine: 1.01 (ref 1.001–1.035)

## 2015-01-11 LAB — MICROALBUMIN / CREATININE URINE RATIO
Creatinine, Urine: 63 mg/dL
MICROALB UR: 0.2 mg/dL (ref <2.0)
Microalb Creat Ratio: 3.2 mg/g (ref 0.0–30.0)

## 2015-01-11 LAB — INSULIN, RANDOM: INSULIN: 2.1 u[IU]/mL (ref 2.0–19.6)

## 2015-01-11 LAB — VITAMIN D 25 HYDROXY (VIT D DEFICIENCY, FRACTURES): VIT D 25 HYDROXY: 71 ng/mL (ref 30–100)

## 2015-01-11 LAB — VITAMIN B12: VITAMIN B 12: 1911 pg/mL — AB (ref 211–911)

## 2015-01-11 LAB — TSH: TSH: 0.281 u[IU]/mL — AB (ref 0.350–4.500)

## 2015-01-14 ENCOUNTER — Other Ambulatory Visit: Payer: Self-pay | Admitting: Internal Medicine

## 2015-02-06 ENCOUNTER — Ambulatory Visit (INDEPENDENT_AMBULATORY_CARE_PROVIDER_SITE_OTHER): Payer: 59 | Admitting: Internal Medicine

## 2015-02-06 ENCOUNTER — Encounter: Payer: Self-pay | Admitting: Internal Medicine

## 2015-02-06 VITALS — BP 122/80 | HR 74 | Temp 97.0°F | Resp 16 | Ht 63.0 in | Wt 172.0 lb

## 2015-02-06 DIAGNOSIS — T7840XA Allergy, unspecified, initial encounter: Secondary | ICD-10-CM | POA: Diagnosis not present

## 2015-02-06 DIAGNOSIS — L237 Allergic contact dermatitis due to plants, except food: Secondary | ICD-10-CM | POA: Diagnosis not present

## 2015-02-06 MED ORDER — TRIAMCINOLONE ACETONIDE 0.1 % EX CREA: 1.0000 "application " | TOPICAL_CREAM | Freq: Three times a day (TID) | CUTANEOUS | Status: DC

## 2015-02-06 MED ORDER — DEXAMETHASONE SODIUM PHOSPHATE 10 MG/ML IJ SOLN: 10.0000 mg | Freq: Once | INTRAMUSCULAR | Status: DC

## 2015-02-06 MED ORDER — FAMOTIDINE 20 MG PO TABS: 20.0000 mg | ORAL_TABLET | Freq: Two times a day (BID) | ORAL | Status: DC

## 2015-02-06 MED ORDER — DOXYCYCLINE HYCLATE 100 MG PO CAPS: 100.0000 mg | ORAL_CAPSULE | Freq: Two times a day (BID) | ORAL | Status: DC

## 2015-02-06 MED ORDER — PREDNISONE 20 MG PO TABS: ORAL_TABLET | ORAL | Status: DC

## 2015-02-06 MED ORDER — DEXAMETHASONE SODIUM PHOSPHATE 100 MG/10ML IJ SOLN
10.0000 mg | Freq: Once | INTRAMUSCULAR | Status: AC
  Administered 2015-02-06: 10 mg via INTRAMUSCULAR

## 2015-02-06 NOTE — Patient Instructions (Signed)

## 2015-02-06 NOTE — Progress Notes (Signed)
   Subjective:    Patient ID: [name], female    DOB: 1950-11-28, 64 y.o.   MRN: [id number]  Rash Pertinent negatives include no fatigue, fever or vomiting.   Patient presents to the office for evaluation of arm redness and rash on her right side x 2 days.  Patient reports that she felt that she got bit or stung.  She didn't see an insect.  She usually has a reaction to insect bites.  She  Reports that the redness is spreading rapidly.  She reports that she has not done anything for it.  She also reports that she got into some poison ivy on her left x 3 days.  She reports that she scrubbed it and hasn't been doing anything for it.  It is very itchy.  Review of Systems  Constitutional: Negative for fever, chills and fatigue.  Gastrointestinal: Negative for nausea and vomiting.  Skin: Positive for rash.       Objective:   Physical Exam  Constitutional: She is oriented to person, place, and time. She appears well-developed and well-nourished. No distress.  HENT:  Head: Normocephalic.  Mouth/Throat: Oropharynx is clear and moist. No oropharyngeal exudate.  Eyes: Conjunctivae are normal. No scleral icterus.  Neck: Normal range of motion. Neck supple. No JVD present. No thyromegaly present.  Cardiovascular: Normal rate, regular rhythm, normal heart sounds and intact distal pulses.  Exam reveals no gallop and no friction rub.   No murmur heard. Pulmonary/Chest: Effort normal and breath sounds normal. No respiratory distress. She has no wheezes. She has no rales. She exhibits no tenderness.  Musculoskeletal: Normal range of motion.  Lymphadenopathy:    She has no cervical adenopathy.  Neurological: She is alert and oriented to person, place, and time.  Skin: Skin is warm and dry. Rash noted. Rash is vesicular. She is not diaphoretic.     Nursing note and vitals reviewed.   Filed Vitals:   02/06/15 1358  BP: 122/80  Pulse: 74  Temp: 97 F (36.1 C)  Resp: 16             Assessment & Plan:    1. Allergic reaction, initial encounter -marked macule with pen, patient to monitor size and call office if increases 1/2 inch outside the line in next 24 hrs. -doxycycline -prednisone -benadryl 50 mg q6hrs -pepcid BID -cool compresses -kenalog - dexamethasone (DECADRON) injection 10 mg; Inject 1 mL (10 mg total) into the muscle once.  2. Poison ivy -see above

## 2015-02-07 ENCOUNTER — Encounter: Payer: Self-pay | Admitting: Internal Medicine

## 2015-03-19 ENCOUNTER — Other Ambulatory Visit: Payer: Self-pay | Admitting: Internal Medicine

## 2015-04-15 ENCOUNTER — Other Ambulatory Visit: Payer: Self-pay | Admitting: Internal Medicine

## 2015-04-23 ENCOUNTER — Ambulatory Visit (INDEPENDENT_AMBULATORY_CARE_PROVIDER_SITE_OTHER): Payer: 59 | Admitting: Internal Medicine

## 2015-04-23 ENCOUNTER — Encounter: Payer: Self-pay | Admitting: Internal Medicine

## 2015-04-23 VITALS — BP 138/86 | HR 88 | Temp 98.0°F | Resp 18 | Ht 63.0 in | Wt 162.0 lb

## 2015-04-23 DIAGNOSIS — K439 Ventral hernia without obstruction or gangrene: Secondary | ICD-10-CM | POA: Diagnosis not present

## 2015-04-23 DIAGNOSIS — R300 Dysuria: Secondary | ICD-10-CM | POA: Diagnosis not present

## 2015-04-23 DIAGNOSIS — R413 Other amnesia: Secondary | ICD-10-CM | POA: Diagnosis not present

## 2015-04-23 LAB — CBC WITH DIFFERENTIAL/PLATELET
BASOS ABS: 0.1 10*3/uL (ref 0.0–0.1)
BASOS PCT: 1 % (ref 0–1)
EOS ABS: 0.4 10*3/uL (ref 0.0–0.7)
EOS PCT: 6 % — AB (ref 0–5)
HCT: 41.3 % (ref 36.0–46.0)
Hemoglobin: 13.9 g/dL (ref 12.0–15.0)
LYMPHS ABS: 2.3 10*3/uL (ref 0.7–4.0)
Lymphocytes Relative: 39 % (ref 12–46)
MCH: 31 pg (ref 26.0–34.0)
MCHC: 33.7 g/dL (ref 30.0–36.0)
MCV: 92.2 fL (ref 78.0–100.0)
MONOS PCT: 8 % (ref 3–12)
MPV: 10.1 fL (ref 8.6–12.4)
Monocytes Absolute: 0.5 10*3/uL (ref 0.1–1.0)
NEUTROS PCT: 46 % (ref 43–77)
Neutro Abs: 2.8 10*3/uL (ref 1.7–7.7)
Platelets: 313 10*3/uL (ref 150–400)
RBC: 4.48 MIL/uL (ref 3.87–5.11)
RDW: 14 % (ref 11.5–15.5)
WBC: 6 10*3/uL (ref 4.0–10.5)

## 2015-04-23 LAB — IRON AND TIBC
%SAT: 31 % (ref 11–50)
Iron: 111 ug/dL (ref 45–160)
TIBC: 357 ug/dL (ref 250–450)
UIBC: 246 ug/dL (ref 125–400)

## 2015-04-23 MED ORDER — CIPROFLOXACIN HCL 500 MG PO TABS: 500.0000 mg | ORAL_TABLET | Freq: Two times a day (BID) | ORAL | Status: AC

## 2015-04-23 MED ORDER — BUPROPION HCL ER (XL) 450 MG PO TB24: 475.0000 mg | ORAL_TABLET | Freq: Every day | ORAL | Status: DC

## 2015-04-23 MED ORDER — BUPROPION HCL ER (XL) 450 MG PO TB24: 450.0000 mg | ORAL_TABLET | Freq: Every day | ORAL | Status: DC

## 2015-04-23 NOTE — Progress Notes (Signed)
Subjective:    Patient ID: [name], female    DOB: 1950-09-28, 64 y.o.   MRN: [id number]  HPI  Patient presents to the office for evaluation of dysuria x 6 days.  She reports that last week she developed burning with urination and stopping the stream.  She reports that she has seen a gynecologist with [facility] who specialized IC.  She reports that the last UTI was in November 29th.  She reports that she did take fosfomycin which helped.  She reports that she found she had E. Coli in her urine.  She does have history of IBS which has been bothering her.    She reports that she has been having issues with her memory.  She reports that unless that is something she does on a daily basis she needs to have some help.  She forgets things in the house when she goes to look for something.  She finds that she is losing stuff more frequently.  She is not repeating stories.  She reports that nobody at work has noticed changes.  She feels like that has increased over the past couple years.  Take fosfomycin did not impact her memory.  She is unaware of a family history of dementia.    She reports that she has been having a lot of depression and anxiety lately.  She reports that her daughter is moving out because she is getting married.  She is stressed about her leaving.  She is also dealing with identity theft.     Review of Systems  Constitutional: Negative for fever, chills and fatigue.  Respiratory: Negative for chest tightness and shortness of breath.   Cardiovascular: Negative for chest pain and palpitations.  Gastrointestinal: Negative for nausea, vomiting, abdominal pain, diarrhea and constipation.  Genitourinary: Positive for dysuria, urgency and frequency.  Musculoskeletal: Negative for myalgias, arthralgias, gait problem and neck stiffness.  Psychiatric/Behavioral: Negative for sleep disturbance, decreased concentration and agitation. The patient is not hyperactive.          Objective:   Physical Exam  Constitutional: She is oriented to person, place, and time. She appears well-developed and well-nourished.  HENT:  Head: Normocephalic.  Mouth/Throat: Oropharynx is clear and moist. No oropharyngeal exudate.  Eyes: Conjunctivae are normal. No scleral icterus.  Neck: Normal range of motion. Neck supple. No JVD present. No thyromegaly present.  Cardiovascular: Normal rate, regular rhythm, normal heart sounds and intact distal pulses.  Exam reveals no gallop and no friction rub.   No murmur heard. Pulmonary/Chest: Effort normal and breath sounds normal. No respiratory distress. She has no wheezes. She has no rales. She exhibits no tenderness.  Abdominal: Soft. Bowel sounds are normal. She exhibits no distension and no mass. There is no tenderness. There is no rebound and no guarding. A hernia is present.    Musculoskeletal: Normal range of motion.  Lymphadenopathy:    She has no cervical adenopathy.  Neurological: She is alert and oriented to person, place, and time. She has normal strength. No cranial nerve deficit or sensory deficit. Coordination normal.  Skin: Skin is warm and dry. She is not diaphoretic.  Psychiatric: Judgment and thought content normal. Her affect is labile. Cognition and memory are normal. She exhibits a depressed mood. She expresses no homicidal and no suicidal ideation. She expresses no suicidal plans and no homicidal plans. She is inattentive.  Nursing note and vitals reviewed.   Filed Vitals:   04/23/15 1641  BP: 138/86  Pulse: 88  Temp: 98 F (36.7 C)  Resp: 18         Assessment & Plan:    1. Dysuria  - Urinalysis, Routine w reflex microscopic (not at [facility]) - Culture, Urine  2. Hernia of abdominal wall -instructed if pain and cannot reduce with gentle massage and laying down to go to the ER - Ambulatory referral to General Surgery  3. Memory loss -likely coming from very uncontrolled depression. -not will to try  any other medications -increase buproprion and if no change in memory to send to neuro for neuropsych testing - BuPROPion HCl ER, XL, 450 MG TB24; Take 450 mg by mouth daily.  Dispense: 90 tablet; Refill: 1 - CBC with Differential/Platelet - Vitamin B12 - Iron and TIBC - TSH

## 2015-04-24 LAB — URINALYSIS, ROUTINE W REFLEX MICROSCOPIC
Bilirubin Urine: NEGATIVE
Glucose, UA: NEGATIVE
Hgb urine dipstick: NEGATIVE
Ketones, ur: NEGATIVE
LEUKOCYTES UA: NEGATIVE
Nitrite: NEGATIVE
PROTEIN: NEGATIVE
Specific Gravity, Urine: 1.012 (ref 1.001–1.035)
pH: 7.5 (ref 5.0–8.0)

## 2015-04-24 LAB — URINE CULTURE
COLONY COUNT: NO GROWTH
Organism ID, Bacteria: NO GROWTH

## 2015-04-24 LAB — VITAMIN B12: Vitamin B-12: >2000 pg/mL — ABNORMAL HIGH (ref 211–911)

## 2015-04-24 LAB — TSH: TSH: 0.715 u[IU]/mL (ref 0.350–4.500)

## 2015-05-09 DIAGNOSIS — R1904 Left lower quadrant abdominal swelling, mass and lump: Secondary | ICD-10-CM | POA: Diagnosis not present

## 2015-05-13 ENCOUNTER — Other Ambulatory Visit: Payer: Self-pay | Admitting: General Surgery

## 2015-05-13 DIAGNOSIS — R1904 Left lower quadrant abdominal swelling, mass and lump: Secondary | ICD-10-CM

## 2015-05-24 ENCOUNTER — Other Ambulatory Visit: Payer: Self-pay | Admitting: Internal Medicine

## 2015-05-24 DIAGNOSIS — Z0000 Encounter for general adult medical examination without abnormal findings: Secondary | ICD-10-CM | POA: Diagnosis not present

## 2015-05-24 DIAGNOSIS — R300 Dysuria: Secondary | ICD-10-CM | POA: Diagnosis not present

## 2015-05-24 DIAGNOSIS — N3010 Interstitial cystitis (chronic) without hematuria: Secondary | ICD-10-CM | POA: Diagnosis not present

## 2015-05-24 MED FILL — LEVOTHYROXINE 125 MCG TAB: 125 | 90 days supply | Qty: 90 | Fill #0

## 2015-05-24 MED FILL — OSCIMIN SR 0.375 MG TABLET: 0.375 | 30 days supply | Qty: 120 | Fill #1

## 2015-05-24 MED FILL — ONDANSETRON HCL 8 MG TABLET: 8 | 8 days supply | Qty: 24 | Fill #0

## 2015-05-30 ENCOUNTER — Ambulatory Visit
Admission: RE | Admit: 2015-05-30 | Discharge: 2015-05-30 | Disposition: A | Discharge status: Home / Self Care | Payer: 59 | Source: Ambulatory Visit | Attending: General Surgery | Admitting: General Surgery

## 2015-05-30 DIAGNOSIS — R1900 Intra-abdominal and pelvic swelling, mass and lump, unspecified site: Secondary | ICD-10-CM | POA: Diagnosis not present

## 2015-05-30 DIAGNOSIS — R1904 Left lower quadrant abdominal swelling, mass and lump: Secondary | ICD-10-CM

## 2015-05-30 MED ORDER — IOPAMIDOL (ISOVUE-300) INJECTION 61%
100.0000 mL | Freq: Once | INTRAVENOUS | Status: AC | PRN
  Administered 2015-05-30: 100 mL via INTRAVENOUS

## 2015-06-12 ENCOUNTER — Other Ambulatory Visit: Payer: Self-pay | Admitting: Internal Medicine

## 2015-06-12 DIAGNOSIS — F688 Other specified disorders of adult personality and behavior: Secondary | ICD-10-CM

## 2015-06-12 DIAGNOSIS — F32A Depression, unspecified: Secondary | ICD-10-CM

## 2015-06-12 DIAGNOSIS — R413 Other amnesia: Secondary | ICD-10-CM

## 2015-06-12 DIAGNOSIS — F329 Major depressive disorder, single episode, unspecified: Secondary | ICD-10-CM

## 2015-06-16 ENCOUNTER — Encounter (HOSPITAL_COMMUNITY): Payer: Self-pay | Admitting: Nurse Practitioner

## 2015-06-16 ENCOUNTER — Inpatient Hospital Stay (HOSPITAL_COMMUNITY)
Admission: EM | Admit: 2015-06-16 | Discharge: 2015-06-19 | DRG: 871 | Disposition: A | Discharge status: Home / Self Care | Payer: 59 | Attending: Internal Medicine | Admitting: Internal Medicine

## 2015-06-16 ENCOUNTER — Emergency Department (HOSPITAL_COMMUNITY): Payer: 59

## 2015-06-16 ENCOUNTER — Inpatient Hospital Stay (HOSPITAL_COMMUNITY): Payer: 59

## 2015-06-16 DIAGNOSIS — N390 Urinary tract infection, site not specified: Secondary | ICD-10-CM

## 2015-06-16 DIAGNOSIS — F329 Major depressive disorder, single episode, unspecified: Secondary | ICD-10-CM | POA: Diagnosis present

## 2015-06-16 DIAGNOSIS — I130 Hypertensive heart and chronic kidney disease with heart failure and stage 1 through stage 4 chronic kidney disease, or unspecified chronic kidney disease: Secondary | ICD-10-CM | POA: Diagnosis present

## 2015-06-16 DIAGNOSIS — N183 Chronic kidney disease, stage 3 (moderate): Secondary | ICD-10-CM | POA: Diagnosis present

## 2015-06-16 DIAGNOSIS — A419 Sepsis, unspecified organism: Secondary | ICD-10-CM | POA: Diagnosis not present

## 2015-06-16 DIAGNOSIS — N179 Acute kidney failure, unspecified: Secondary | ICD-10-CM | POA: Diagnosis present

## 2015-06-16 DIAGNOSIS — Z6825 Body mass index (BMI) 25.0-25.9, adult: Secondary | ICD-10-CM

## 2015-06-16 DIAGNOSIS — E039 Hypothyroidism, unspecified: Secondary | ICD-10-CM | POA: Diagnosis present

## 2015-06-16 DIAGNOSIS — Z881 Allergy status to other antibiotic agents status: Secondary | ICD-10-CM

## 2015-06-16 DIAGNOSIS — D696 Thrombocytopenia, unspecified: Secondary | ICD-10-CM

## 2015-06-16 DIAGNOSIS — E440 Moderate protein-calorie malnutrition: Secondary | ICD-10-CM | POA: Diagnosis present

## 2015-06-16 DIAGNOSIS — I5032 Chronic diastolic (congestive) heart failure: Secondary | ICD-10-CM | POA: Diagnosis present

## 2015-06-16 DIAGNOSIS — R509 Fever, unspecified: Secondary | ICD-10-CM | POA: Diagnosis not present

## 2015-06-16 DIAGNOSIS — N12 Tubulo-interstitial nephritis, not specified as acute or chronic: Secondary | ICD-10-CM | POA: Diagnosis present

## 2015-06-16 DIAGNOSIS — D6959 Other secondary thrombocytopenia: Secondary | ICD-10-CM | POA: Diagnosis present

## 2015-06-16 DIAGNOSIS — R410 Disorientation, unspecified: Secondary | ICD-10-CM

## 2015-06-16 DIAGNOSIS — Z7982 Long term (current) use of aspirin: Secondary | ICD-10-CM

## 2015-06-16 DIAGNOSIS — G9341 Metabolic encephalopathy: Secondary | ICD-10-CM

## 2015-06-16 DIAGNOSIS — Z87891 Personal history of nicotine dependence: Secondary | ICD-10-CM | POA: Diagnosis not present

## 2015-06-16 DIAGNOSIS — A4151 Sepsis due to Escherichia coli [E. coli]: Secondary | ICD-10-CM | POA: Diagnosis not present

## 2015-06-16 DIAGNOSIS — K5730 Diverticulosis of large intestine without perforation or abscess without bleeding: Secondary | ICD-10-CM | POA: Diagnosis not present

## 2015-06-16 DIAGNOSIS — Z90710 Acquired absence of both cervix and uterus: Secondary | ICD-10-CM | POA: Diagnosis not present

## 2015-06-16 DIAGNOSIS — J441 Chronic obstructive pulmonary disease with (acute) exacerbation: Secondary | ICD-10-CM | POA: Diagnosis present

## 2015-06-16 DIAGNOSIS — N1831 Chronic kidney disease, stage 3a: Secondary | ICD-10-CM | POA: Diagnosis present

## 2015-06-16 DIAGNOSIS — I10 Essential (primary) hypertension: Secondary | ICD-10-CM | POA: Diagnosis present

## 2015-06-16 DIAGNOSIS — E038 Other specified hypothyroidism: Secondary | ICD-10-CM | POA: Diagnosis not present

## 2015-06-16 DIAGNOSIS — I447 Left bundle-branch block, unspecified: Secondary | ICD-10-CM | POA: Diagnosis present

## 2015-06-16 HISTORY — DX: Metabolic encephalopathy: G93.41

## 2015-06-16 LAB — COMPREHENSIVE METABOLIC PANEL
ALT: 23 U/L (ref 14–54)
AST: 28 U/L (ref 15–41)
Albumin: 3.2 g/dL — ABNORMAL LOW (ref 3.5–5.0)
Alkaline Phosphatase: 67 U/L (ref 38–126)
Anion gap: 9 (ref 5–15)
BUN: 32 mg/dL — ABNORMAL HIGH (ref 6–20)
CO2: 24 mmol/L (ref 22–32)
Calcium: 8.5 mg/dL — ABNORMAL LOW (ref 8.9–10.3)
Chloride: 102 mmol/L (ref 101–111)
Creatinine, Ser: 1.5 mg/dL — ABNORMAL HIGH (ref 0.44–1.00)
GFR calc Af Amer: 42 mL/min — ABNORMAL LOW (ref >60)
GFR calc non Af Amer: 36 mL/min — ABNORMAL LOW (ref >60)
Glucose, Bld: 121 mg/dL — ABNORMAL HIGH (ref 65–99)
Potassium: 4.3 mmol/L (ref 3.5–5.1)
Sodium: 135 mmol/L (ref 135–145)
Total Bilirubin: 1 mg/dL (ref 0.3–1.2)
Total Protein: 6.4 g/dL — ABNORMAL LOW (ref 6.5–8.1)

## 2015-06-16 LAB — URINALYSIS, ROUTINE W REFLEX MICROSCOPIC
Glucose, UA: NEGATIVE mg/dL
Ketones, ur: 15 mg/dL — AB
Nitrite: NEGATIVE
Protein, ur: 100 mg/dL — AB
Specific Gravity, Urine: 1.021 (ref 1.005–1.030)
pH: 6 (ref 5.0–8.0)

## 2015-06-16 LAB — CBC WITH DIFFERENTIAL/PLATELET
Basophils Absolute: 0 10*3/uL (ref 0.0–0.1)
Basophils Relative: 0 %
Eosinophils Absolute: 0 10*3/uL (ref 0.0–0.7)
Eosinophils Relative: 0 %
HCT: 40.8 % (ref 36.0–46.0)
Hemoglobin: 13.6 g/dL (ref 12.0–15.0)
Lymphocytes Relative: 8 %
Lymphs Abs: 0.6 10*3/uL — ABNORMAL LOW (ref 0.7–4.0)
MCH: 31.3 pg (ref 26.0–34.0)
MCHC: 33.3 g/dL (ref 30.0–36.0)
MCV: 93.8 fL (ref 78.0–100.0)
Monocytes Absolute: 0.6 10*3/uL (ref 0.1–1.0)
Monocytes Relative: 8 %
Neutro Abs: 5.6 10*3/uL (ref 1.7–7.7)
Neutrophils Relative %: 84 %
Platelets: 151 10*3/uL (ref 150–400)
RBC: 4.35 MIL/uL (ref 3.87–5.11)
RDW: 13 % (ref 11.5–15.5)
WBC: 6.7 10*3/uL (ref 4.0–10.5)

## 2015-06-16 LAB — URINE MICROSCOPIC-ADD ON: Squamous Epithelial / LPF: NONE SEEN

## 2015-06-16 LAB — I-STAT CG4 LACTIC ACID, ED: Lactic Acid, Venous: 1.22 mmol/L (ref 0.5–2.0)

## 2015-06-16 MED ORDER — SENNOSIDES-DOCUSATE SODIUM 8.6-50 MG PO TABS: 1.0000 | ORAL_TABLET | Freq: Every evening | ORAL | Status: DC | PRN

## 2015-06-16 MED ORDER — ENSURE ENLIVE PO LIQD
237.0000 mL | Freq: Two times a day (BID) | ORAL | Status: DC
  Administered 2015-06-17: 237 mL via ORAL

## 2015-06-16 MED ORDER — ACETAMINOPHEN 650 MG RE SUPP: 650.0000 mg | Freq: Four times a day (QID) | RECTAL | Status: DC | PRN

## 2015-06-16 MED ORDER — ACETAMINOPHEN 325 MG PO TABS
650.0000 mg | ORAL_TABLET | Freq: Four times a day (QID) | ORAL | Status: DC | PRN
  Administered 2015-06-16: 650 mg via ORAL
  Filled 2015-06-16: qty 2

## 2015-06-16 MED ORDER — PANTOPRAZOLE SODIUM 40 MG IV SOLR
40.0000 mg | Freq: Every day | INTRAVENOUS | Status: DC
  Administered 2015-06-16 – 2015-06-17 (×2): 40 mg via INTRAVENOUS
  Filled 2015-06-16 (×2): qty 40

## 2015-06-16 MED ORDER — ONDANSETRON HCL 4 MG/2ML IJ SOLN
4.0000 mg | Freq: Four times a day (QID) | INTRAMUSCULAR | Status: DC | PRN
  Administered 2015-06-18 – 2015-06-19 (×3): 4 mg via INTRAVENOUS
  Filled 2015-06-16 (×3): qty 2

## 2015-06-16 MED ORDER — SODIUM CHLORIDE 0.9 % IV BOLUS (SEPSIS)
1000.0000 mL | Freq: Once | INTRAVENOUS | Status: AC
  Administered 2015-06-16: 1000 mL via INTRAVENOUS

## 2015-06-16 MED ORDER — DEXTROSE 5 % IV SOLN
2.0000 g | Freq: Three times a day (TID) | INTRAVENOUS | Status: DC
  Administered 2015-06-16 – 2015-06-17 (×3): 2 g via INTRAVENOUS
  Filled 2015-06-16 (×4): qty 2

## 2015-06-16 MED ORDER — ONDANSETRON HCL 4 MG PO TABS
4.0000 mg | ORAL_TABLET | Freq: Four times a day (QID) | ORAL | Status: DC | PRN
  Administered 2015-06-18 – 2015-06-19 (×2): 4 mg via ORAL
  Filled 2015-06-16 (×2): qty 1

## 2015-06-16 MED ORDER — DEXTROSE 5 % IV SOLN
500.0000 mg | Freq: Every day | INTRAVENOUS | Status: DC
  Administered 2015-06-16: 500 mg via INTRAVENOUS
  Filled 2015-06-16 (×2): qty 500

## 2015-06-16 MED ORDER — ACETAMINOPHEN 325 MG PO TABS
650.0000 mg | ORAL_TABLET | Freq: Once | ORAL | Status: AC
Start: 2015-06-16 — End: 2015-06-16
  Administered 2015-06-16: 650 mg via ORAL
  Filled 2015-06-16: qty 2

## 2015-06-16 MED ORDER — ONDANSETRON HCL 4 MG/2ML IJ SOLN
4.0000 mg | Freq: Once | INTRAMUSCULAR | Status: AC
  Administered 2015-06-16: 4 mg via INTRAVENOUS
  Filled 2015-06-16: qty 2

## 2015-06-16 MED ORDER — CIPROFLOXACIN IN D5W 400 MG/200ML IV SOLN
400.0000 mg | Freq: Once | INTRAVENOUS | Status: AC
  Administered 2015-06-16: 400 mg via INTRAVENOUS
  Filled 2015-06-16: qty 200

## 2015-06-16 MED ORDER — SODIUM CHLORIDE 0.9 % IV BOLUS (SEPSIS)
1000.0000 mL | Freq: Once | INTRAVENOUS | Status: AC
Start: 2015-06-16 — End: 2015-06-16
  Administered 2015-06-16: 1000 mL via INTRAVENOUS

## 2015-06-16 MED ORDER — ALUM & MAG HYDROXIDE-SIMETH 200-200-20 MG/5ML PO SUSP: 30.0000 mL | Freq: Four times a day (QID) | ORAL | Status: DC | PRN

## 2015-06-16 MED ORDER — ENOXAPARIN SODIUM 40 MG/0.4ML ~~LOC~~ SOLN
40.0000 mg | Freq: Every day | SUBCUTANEOUS | Status: DC
  Administered 2015-06-16 – 2015-06-18 (×3): 40 mg via SUBCUTANEOUS
  Filled 2015-06-16 (×3): qty 0.4

## 2015-06-16 MED ORDER — SODIUM CHLORIDE 0.9 % IV SOLN
INTRAVENOUS | Status: AC
  Administered 2015-06-16 – 2015-06-17 (×2): via INTRAVENOUS

## 2015-06-16 MED ORDER — VANCOMYCIN HCL IN DEXTROSE 1-5 GM/200ML-% IV SOLN
1000.0000 mg | Freq: Every day | INTRAVENOUS | Status: DC
  Administered 2015-06-17: 1000 mg via INTRAVENOUS
  Filled 2015-06-16: qty 200

## 2015-06-16 NOTE — ED Notes (Signed)
Bed: WA07 Expected date:  Expected time:  Means of arrival:  Comments: [facility] 

## 2015-06-16 NOTE — ED Notes (Signed)
Attempted to call report before shift change. Unable to give report due to unsure of which nurse will take this patient on the assigned floor.

## 2015-06-16 NOTE — H&P (Addendum)
Patient Demographics  [name], is a 65 y.o. 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 Her temperature was greater than 101F, white count 6.700, bun/cr 32/1.50 in the ED and her UA suggested UTI. CXR unrevealing. Patient took some Xanax and phenergan in an effort to sleep off feeling bad. She is mostly somnolent at the time of my encounter but daughter/son helps with the history. She has been started on Ciprofloxacin and referred for admission. The question is the extent of infection, given the fact that patient has been battling a cold. She also reportedly vomited. 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 There is a possibility that she aspirated given vomiting. Benzodiazepines likely increasing somnolence. Patient is full code. I discussed the care plan with patient and her son and daughter. Plan Sepsis (HCC)/UTI (lower urinary tract infection)/COPD Exacerbation/Acute metabolic encephalopathy  Admit Med surg  Septic work up-UA/urine culture/blood culture/chest x-ray/CT abdomen/CT chest  Vanomycin/Aztreonam/Azithromycin CKD Stage 3 (GFR 55 ml/min)/AKI (acute kidney injury) ([place])   Expect renal function to improve with hydration and  treatment of infection  Gentle fluids  Adjust medications for creatinine clearance Hypothyroidism/LBBB (left bundle branch block)/HTN (hypertension)  Hold home medications till she is more with it    DVT/GI Prophylaxis  Lovenox  Pepcid Family Communication: Admission, patients condition and plan of care including tests being ordered have been discussed with the patient and family who indicate understanding and agree with the plan and Code Status. Code Status   Full Code Likely DC  Home  Condition GUARDED    Time spent in minutes : 78  Chief Complaint Chief Complaint  Patient presents with  . Dysuria  . 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 Patient reports having to leave work early on Friday as she felt very fatigued. Symptoms have been progressing since then. She admits to subjective fever,  Dysuria and nausea. No vomiting or diarrhea. No cough or other respiratory complaints.  Review of Systems   As in the HPI above.   Past Medical History  Diagnosis Date  . OCD (obsessive compulsive disorder)   . IBS (irritable bowel syndrome)   . Diverticulosis   . Helicobacter pylori (H. pylori)   . History of IBS   . Prediabetes   . Migraines   . Vitamin D deficiency       Past Surgical History  Procedure Laterality Date  . Tonsillectomy    . Oophorectomy    . Bunionectomy      Bil  . Colon surgery      colonoscopy  . Hemorrhoid surgery    . Breast enhancement surgery      Social History Social History  Substance Use Topics  . Smoking status:  Former Smoker -- 1.00 packs/day for 40 years    Types: Cigarettes    Quit date: 04/27/2002  . Smokeless tobacco: Never Used  . Alcohol Use: No     Comment: ocassional    Family History Family History  Problem Relation Age of Onset  . Colon polyps Mother   . Colon cancer Neg Hx     Prior to Admission medications     Medication Sig Start Date End Date Taking? Authorizing Provider  ALPRAZolam Duanne Moron) 0.5 MG tablet Take 1/2 to 1 tablet 3 x day if needed for anxiety or sleep 04/15/14  Yes Unk [name], MD  acetaminophen (TYLENOL) 500 MG tablet Take 500 mg by mouth every 6 (six) hours as needed for pain.    Historical Provider, MD  amitriptyline (ELAVIL) 25 MG tablet Take 1 to 2 tablets at bedtime for bladder spasms 06/12/14 04/23/15  Unk [name], MD  aspirin 81 MG chewable tablet Chew 81 mg by mouth daily.      Historical Provider, MD  atenolol (TENORMIN) 100 MG tablet TAKE 1 TABLET BY MOUTH DAILY 01/15/14   Unk [name], MD  B Complex-C (SUPER B COMPLEX PO) Take by mouth daily.    Historical Provider, MD  BuPROPion HCl ER, XL, 450 MG TB24 Take 450 mg by mouth daily. 04/23/15   Courtney Forcucci, PA-C  calcium carbonate (OS-CAL) 600 MG TABS Take 600 mg by mouth daily.    Historical Provider, MD  calcium carbonate (TUMS - DOSED IN MG ELEMENTAL CALCIUM) 500 MG chewable tablet Chew 1 tablet by mouth as needed. Uses Rolaids    Historical Provider, MD  Cholecalciferol (VITAMIN D-3) 5000 UNITS TABS Take 7,000 Units by mouth daily.     Historical Provider, MD  famotidine (PEPCID) 20 MG tablet Take 1 tablet (20 mg total) by mouth 2 (two) times daily. 02/06/15 02/06/16  Courtney Forcucci, PA-C  hyoscyamine (LEVSIN SL) 0.125 MG SL tablet Place 2 tablets (0.25 mg total) under the tongue every 4 (four) hours as needed. Stomach pain and bladder spasms 04/02/14   Anderson Malta Couillard, PA-C  levothyroxine (SYNTHROID, LEVOTHROID) 100 MCG tablet Take 100 mcg by mouth daily. 1.5 Tue Thur only 1 rest of week    Historical Provider, MD  levothyroxine (SYNTHROID, LEVOTHROID) 125 MCG tablet TAKE 1 TABLET BY MOUTH ONCE DAILY 01/14/15   Unk [name], MD  Magnesium 250 MG TABS Take 250 mg by mouth daily.    Historical Provider, MD  Melatonin 3 MG TABS Take 1 tablet by mouth as needed.    Historical Provider, MD  Multiple  Vitamins-Minerals (MULTIVITAMIN WITH MINERALS) tablet Take 1 tablet by mouth daily.    Historical Provider, MD  ondansetron (ZOFRAN) 8 MG tablet TAKE 1 TABLET BY MOUTH 3 TIMES DAILY AS NEEDED FOR NAUSEA 05/24/15   Unk [name], MD  OSCIMIN SR 0.375 MG 12 hr tablet TAKE 1-2 TABLETS BY MOUTH TWICE DAILY 03/19/15   Courtney Forcucci, PA-C  OVER THE COUNTER MEDICATION Black Kohosh 1 capsule daily.    Historical Provider, MD    Allergies  Allergen Reactions  . Ace Inhibitors Other (See Comments)    Acute renal failure (lisinopril)  . Cephalexin Anaphylaxis  . Macrobid [Nitrofurantoin] Other (See Comments)    Causes hyperthermia   . Codeine Nausea Only  . Sulfa Antibiotics Rash    Physical Exam  Vitals  Blood pressure 130/92, pulse 78, temperature 98.7 F (37.1 C), temperature source Oral, resp. rate 16, height 5\' 5"  (1.651 m), weight 69.536 kg (153  lb 4.8 oz), SpO2 95 %.   General:  Comfortable at rest.Somnolent.  Cardiovascular: S1-S2 normal. No murmurs. Pulse regular.  Respiratory: Good air entry bilaterally. No rhonchi or rales.  Abdomen: Soft and nontender. Normal bowel sounds. No organomegaly.  Musculoskeletal: No pedal edema   Neurological: Intact  Data Review CBC  Recent Labs Lab 06/16/15 1629  WBC 6.7  HGB 13.6  HCT 40.8  PLT 151  MCV 93.8  MCH 31.3  MCHC 33.3  RDW 13.0  LYMPHSABS 0.6*  MONOABS 0.6  EOSABS 0.0  BASOSABS 0.0   ------------------------------------------------------------------------------------------------------------------  Chemistries   Recent Labs Lab 06/16/15 1629  NA 135  K 4.3  CL 102  CO2 24  GLUCOSE 121*  BUN 32*  CREATININE 1.50*  CALCIUM 8.5*  AST 28  ALT 23  ALKPHOS 67  BILITOT 1.0   ------------------------------------------------------------------------------------------------------------------ estimated creatinine clearance is 37.6 mL/min (by C-G formula based on Cr of  1.5). ------------------------------------------------------------------------------------------------------------------ No results for input(s): TSH, T4TOTAL, T3FREE, THYROIDAB in the last 72 hours.  Invalid input(s): FREET3   Coagulation profile No results for input(s): INR, PROTIME in the last 168 hours. ------------------------------------------------------------------------------------------------------------------- No results for input(s): DDIMER in the last 72 hours. -------------------------------------------------------------------------------------------------------------------  Cardiac Enzymes No results for input(s): CKMB, TROPONINI, MYOGLOBIN in the last 168 hours.  Invalid input(s): CK ------------------------------------------------------------------------------------------------------------------ Invalid input(s): POCBNP   ---------------------------------------------------------------------------------------------------------------  Urinalysis    Component Value Date/Time   COLORURINE AMBER* 06/16/2015 1616   APPEARANCEUR TURBID* 06/16/2015 1616   LABSPEC 1.021 06/16/2015 1616   PHURINE 6.0 06/16/2015 1616   GLUCOSEU NEGATIVE 06/16/2015 1616   HGBUR LARGE* 06/16/2015 1616   BILIRUBINUR SMALL* 06/16/2015 1616   KETONESUR 15* 06/16/2015 1616   PROTEINUR 100* 06/16/2015 1616   UROBILINOGEN 1 04/02/2014 1528   NITRITE NEGATIVE 06/16/2015 1616   LEUKOCYTESUR LARGE* 06/16/2015 1616    ----------------------------------------------------------------------------------------------------------------  Imaging results  Ct Abdomen Pelvis W Contrast  05/30/2015  CLINICAL DATA:  Left lower quadrant abdominal wall mass or hernia. EXAM: CT ABDOMEN AND PELVIS WITH CONTRAST TECHNIQUE: Multidetector CT imaging of the abdomen and pelvis was performed using the standard protocol following bolus administration of intravenous contrast. CONTRAST:  133mL ISOVUE-300 IOPAMIDOL  (ISOVUE-300) INJECTION 61% COMPARISON:  CT scan of May 30, 2013. FINDINGS: Severe degenerative disc disease is noted at L4-5. Visualized lung bases are unremarkable. No gallstones are noted. The liver, spleen and pancreas appear normal. Adrenal glands and kidneys appear normal. No hydronephrosis or renal obstruction is noted. Atherosclerosis of abdominal aorta is noted without aneurysm formation. Stool is noted throughout the colon suggesting constipation. No abnormal bowel dilatation is noted. No abnormal fluid collection is noted. No definite evidence of hernia or mass is noted. Urinary bladder appears normal. Status post hysterectomy. No significant adenopathy is noted. IMPRESSION: Atherosclerosis of abdominal aorta without aneurysm formation. Stool is noted throughout the colon suggesting constipation. No definite evidence of abdominal wall hernia or mass is noted. Electronically Signed   By: Marijo Conception, M.D.   On: 05/30/2015 09:51   Dg Chest Port 1 View  06/16/2015  CLINICAL DATA:  Fever, altered mental status.  Initial encounter. EXAM: PORTABLE CHEST 1 VIEW COMPARISON:  None. FINDINGS: The lungs are clear. Heart size is normal. No pneumothorax or pleural effusion. Calcified breast implant on the right is noted. IMPRESSION: No acute disease. Electronically Signed   By: Inge Rise M.D.   On: 06/16/2015 18:57        Dameka Younker M.D on 06/16/2015 at 10:43 PM  Between 7am  to 7pm - Pager - [phone]  After 7pm go to [email or web address] - password [email or web address]  And look for the night coverage person covering me after hours  [facility] Office  [phone]

## 2015-06-16 NOTE — ED Notes (Addendum)
Pt is presented from home via EMS, c/o of pain with urination obvious altered mental state, AOx2, pale and mildly lethargic, malodorous smell typical of UTI, pt endorses taking multiple AZO's without relief from the urinary symptoms.

## 2015-06-16 NOTE — Progress Notes (Signed)
Pharmacy Antibiotic Note  [name] is a 65 y.o. female admitted on 06/16/2015 with sepsis.  Pharmacy has been consulted for Vancomycin dosing.  Plan: Vancomycin 1gm IV every q24hr hours.  Goal trough 15-20 mcg/mL.  Height: 5\' 5"  (165.1 cm) Weight: 153 lb 4.8 oz (69.536 kg) IBW/kg (Calculated) : 57  Temp (24hrs), Avg:100.2 F (37.9 C), Min:98.7 F (37.1 C), Max:101.7 F (38.7 C)   Recent Labs Lab 06/16/15 1629 06/16/15 1639  WBC 6.7  --   CREATININE 1.50*  --   LATICACIDVEN  --  1.22    Estimated Creatinine Clearance: 37.6 mL/min (by C-G formula based on Cr of 1.5).    Allergies  Allergen Reactions  . Ace Inhibitors Other (See Comments)    Acute renal failure (lisinopril)  . Cephalexin Anaphylaxis  . Macrobid [Nitrofurantoin] Other (See Comments)    Causes hyperthermia   . Codeine Nausea Only  . Sulfa Antibiotics Rash    Antimicrobials this admission: Vancomycin 2/19 >>  Aztreonam 2/19 >>  Azithromycin 2/19 >>   Thank you for allowing pharmacy to be a part of this patient's care.  [name] 06/16/2015 9:25 PM

## 2015-06-16 NOTE — ED Provider Notes (Signed)
CSN: [id number]     Arrival date & time 06/16/15  1554 History   First MD Initiated Contact with Patient 06/16/15 1611     Chief Complaint  Patient presents with  . Dysuria  . Altered Mental Status     (Consider location/radiation/quality/duration/timing/severity/associated sxs/prior Treatment) HPI  [age]-year-old female with altered mental status. Patient reports having to leave work early on Friday she felt very fatigued. Symptoms progressing since then. Subjective fever. Dysuria. Nausea. No vomiting or diarrhea. No cough or other respiratory complaints. No sick contacts.   Past Medical History  Diagnosis Date  . OCD (obsessive compulsive disorder)   . IBS (irritable bowel syndrome)   . Diverticulosis   . Helicobacter pylori (H. pylori)   . History of IBS   . Prediabetes   . Migraines   . Vitamin D deficiency    Past Surgical History  Procedure Laterality Date  . Tonsillectomy    . Oophorectomy    . Bunionectomy      Bil  . Colon surgery      colonoscopy  . Hemorrhoid surgery    . Breast enhancement surgery     Family History  Problem Relation Age of Onset  . Colon polyps Mother   . Colon cancer Neg Hx    Social History  Substance Use Topics  . Smoking status: Former Smoker -- 1.00 packs/day for 40 years    Types: Cigarettes    Quit date: 04/27/2002  . Smokeless tobacco: Never Used  . Alcohol Use: Yes     Comment: ocassional   OB History    No data available     Review of Systems  All systems reviewed and negative, other than as noted in HPI.   Allergies  Ace inhibitors; Cephalexin; Macrobid; Codeine; and Sulfa antibiotics  Home Medications   Prior to Admission medications   Medication Sig Start Date End Date Taking? Authorizing Provider  ALPRAZolam Duanne Moron) 0.5 MG tablet Take 1/2 to 1 tablet 3 x day if needed for anxiety or sleep 04/15/14  Yes Unk [name], MD  acetaminophen (TYLENOL) 500 MG tablet Take 500 mg by mouth every 6 (six) hours as  needed for pain.    Historical Provider, MD  amitriptyline (ELAVIL) 25 MG tablet Take 1 to 2 tablets at bedtime for bladder spasms 06/12/14 04/23/15  Unk [name], MD  aspirin 81 MG chewable tablet Chew 81 mg by mouth daily.      Historical Provider, MD  atenolol (TENORMIN) 100 MG tablet TAKE 1 TABLET BY MOUTH DAILY 01/15/14   Unk [name], MD  B Complex-C (SUPER B COMPLEX PO) Take by mouth daily.    Historical Provider, MD  BuPROPion HCl ER, XL, 450 MG TB24 Take 450 mg by mouth daily. 04/23/15   Courtney Forcucci, PA-C  calcium carbonate (OS-CAL) 600 MG TABS Take 600 mg by mouth daily.    Historical Provider, MD  calcium carbonate (TUMS - DOSED IN MG ELEMENTAL CALCIUM) 500 MG chewable tablet Chew 1 tablet by mouth as needed. Uses Rolaids    Historical Provider, MD  Cholecalciferol (VITAMIN D-3) 5000 UNITS TABS Take 7,000 Units by mouth daily.     Historical Provider, MD  famotidine (PEPCID) 20 MG tablet Take 1 tablet (20 mg total) by mouth 2 (two) times daily. 02/06/15 02/06/16  Courtney Forcucci, PA-C  hyoscyamine (LEVSIN SL) 0.125 MG SL tablet Place 2 tablets (0.25 mg total) under the tongue every 4 (four) hours as needed. Stomach pain and bladder spasms 04/02/14  Jennifer Couillard, PA-C  levothyroxine (SYNTHROID, LEVOTHROID) 100 MCG tablet Take 100 mcg by mouth daily. 1.5 Tue Thur only 1 rest of week    Historical Provider, MD  levothyroxine (SYNTHROID, LEVOTHROID) 125 MCG tablet TAKE 1 TABLET BY MOUTH ONCE DAILY 01/14/15   Unk [name], MD  Magnesium 250 MG TABS Take 250 mg by mouth daily.    Historical Provider, MD  Melatonin 3 MG TABS Take 1 tablet by mouth as needed.    Historical Provider, MD  Multiple Vitamins-Minerals (MULTIVITAMIN WITH MINERALS) tablet Take 1 tablet by mouth daily.    Historical Provider, MD  ondansetron (ZOFRAN) 8 MG tablet TAKE 1 TABLET BY MOUTH 3 TIMES DAILY AS NEEDED FOR NAUSEA 05/24/15   Unk [name], MD  OSCIMIN SR 0.375 MG 12 hr tablet TAKE 1-2 TABLETS BY  MOUTH TWICE DAILY 03/19/15   Courtney Forcucci, PA-C  OVER THE COUNTER MEDICATION [place] 1 capsule daily.    Historical Provider, MD   BP 100/64 mmHg  Pulse 83  Temp(Src) 101.7 F (38.7 C) (Rectal)  Resp 15  SpO2 100% Physical Exam  Constitutional: She appears well-developed and well-nourished. No distress.  HENT:  Head: Normocephalic and atraumatic.  Eyes: Conjunctivae are normal. Right eye exhibits no discharge. Left eye exhibits no discharge.  Neck: Neck supple.  Cardiovascular: Normal rate, regular rhythm and normal heart sounds.  Exam reveals no gallop and no friction rub.   No murmur heard. Pulmonary/Chest: Effort normal and breath sounds normal. No respiratory distress.  Abdominal: Soft. She exhibits no distension. There is no tenderness.  Musculoskeletal: She exhibits no edema or tenderness.  Neurological:  Drowsy. Opens eyes to voice. Speech is slowed but understandable. Answers questions appropriately. Follows simple commands. No focal motor deficit.  Skin: Skin is warm and dry.  Psychiatric: She has a normal mood and affect. Her behavior is normal. Thought content normal.  Nursing note and vitals reviewed.   ED Course  Procedures (including critical care time) Labs Review Labs Reviewed  CBC WITH DIFFERENTIAL/PLATELET - Abnormal; Notable for the following:    Lymphs Abs 0.6 (*)    All other components within normal limits  URINALYSIS, ROUTINE W REFLEX MICROSCOPIC (NOT AT [facility]) - Abnormal; Notable for the following:    Color, Urine AMBER (*)    APPearance TURBID (*)    Hgb urine dipstick LARGE (*)    Bilirubin Urine SMALL (*)    Ketones, ur 15 (*)    Protein, ur 100 (*)    Leukocytes, UA LARGE (*)    All other components within normal limits  COMPREHENSIVE METABOLIC PANEL - Abnormal; Notable for the following:    Glucose, Bld 121 (*)    BUN 32 (*)    Creatinine, Ser 1.50 (*)    Calcium 8.5 (*)    Total Protein 6.4 (*)    Albumin 3.2 (*)    GFR calc non  Af Amer 36 (*)    GFR calc Af Amer 42 (*)    All other components within normal limits  URINE MICROSCOPIC-ADD ON - Abnormal; Notable for the following:    Bacteria, UA MANY (*)    All other components within normal limits  CULTURE, BLOOD (ROUTINE X 2)  CULTURE, BLOOD (ROUTINE X 2)  URINE CULTURE  I-STAT CG4 LACTIC ACID, ED    Imaging Review Ct Abdomen Pelvis Wo Contrast  06/17/2015  CLINICAL DATA:  Acute onset of sepsis.  Initial encounter. EXAM: CT CHEST, ABDOMEN AND PELVIS WITHOUT CONTRAST TECHNIQUE: Multidetector  CT imaging of the chest, abdomen and pelvis was performed following the standard protocol without IV contrast. COMPARISON:  None. FINDINGS: CT CHEST Emphysematous change is noted at the lung apices. Scattered peripheral blebs are seen bilaterally. Minimal bilateral scarring is seen. No pleural effusion or pneumothorax is identified. No masses are seen. Scattered coronary artery calcifications are seen. Scattered calcification is noted along the aortic arch. No pericardial effusion is identified. The great vessels are grossly unremarkable in appearance. The thyroid gland is grossly unremarkable. No axillary lymphadenopathy is seen. Diffuse calcification is noted about the breast implants, more prominent on the right. No acute osseous abnormalities are identified. CT ABDOMEN AND PELVIS The liver and spleen are unremarkable in appearance. The gallbladder is within normal limits. The pancreas and adrenal glands are unremarkable. Nonspecific perinephric stranding is noted on the right, with mildly asymmetric right-sided pelvicaliectasis. No hydronephrosis is seen. Would correlate clinically for any evidence of pyelonephritis. No renal or ureteral stones are identified. No free fluid is identified. The small bowel is unremarkable in appearance. The stomach is within normal limits. No acute vascular abnormalities are seen. Scattered calcification is noted along the abdominal aorta and its  branches. The appendix is not definitely characterized; there is no evidence for appendicitis. Scattered diverticulosis is noted along the sigmoid colon, without evidence of diverticulitis. The bladder is mildly distended and grossly unremarkable. The patient is status post hysterectomy. No suspicious adnexal masses are seen. No inguinal lymphadenopathy is seen. No acute osseous abnormalities are identified. Vacuum phenomenon is noted at L4-L5. IMPRESSION: 1. Right-sided perinephric stranding, with mildly asymmetric right-sided pelvicaliectasis. Would correlate clinically for any evidence of pyelonephritis. 2. Emphysematous change at the lung apices, with scattered bilateral peripheral blebs. Minimal bilateral scarring noted. 3. Scattered coronary artery calcifications seen. 4. Scattered calcification along the abdominal aorta and its branches. 5. Scattered diverticulosis along the sigmoid colon, without evidence of diverticulitis. Electronically Signed   By: Garald Balding M.D.   On: 06/17/2015 02:03   Ct Chest Wo Contrast  06/17/2015  CLINICAL DATA:  Acute onset of sepsis.  Initial encounter. EXAM: CT CHEST, ABDOMEN AND PELVIS WITHOUT CONTRAST TECHNIQUE: Multidetector CT imaging of the chest, abdomen and pelvis was performed following the standard protocol without IV contrast. COMPARISON:  None. FINDINGS: CT CHEST Emphysematous change is noted at the lung apices. Scattered peripheral blebs are seen bilaterally. Minimal bilateral scarring is seen. No pleural effusion or pneumothorax is identified. No masses are seen. Scattered coronary artery calcifications are seen. Scattered calcification is noted along the aortic arch. No pericardial effusion is identified. The great vessels are grossly unremarkable in appearance. The thyroid gland is grossly unremarkable. No axillary lymphadenopathy is seen. Diffuse calcification is noted about the breast implants, more prominent on the right. No acute osseous abnormalities  are identified. CT ABDOMEN AND PELVIS The liver and spleen are unremarkable in appearance. The gallbladder is within normal limits. The pancreas and adrenal glands are unremarkable. Nonspecific perinephric stranding is noted on the right, with mildly asymmetric right-sided pelvicaliectasis. No hydronephrosis is seen. Would correlate clinically for any evidence of pyelonephritis. No renal or ureteral stones are identified. No free fluid is identified. The small bowel is unremarkable in appearance. The stomach is within normal limits. No acute vascular abnormalities are seen. Scattered calcification is noted along the abdominal aorta and its branches. The appendix is not definitely characterized; there is no evidence for appendicitis. Scattered diverticulosis is noted along the sigmoid colon, without evidence of diverticulitis. The bladder is mildly distended  and grossly unremarkable. The patient is status post hysterectomy. No suspicious adnexal masses are seen. No inguinal lymphadenopathy is seen. No acute osseous abnormalities are identified. Vacuum phenomenon is noted at L4-L5. IMPRESSION: 1. Right-sided perinephric stranding, with mildly asymmetric right-sided pelvicaliectasis. Would correlate clinically for any evidence of pyelonephritis. 2. Emphysematous change at the lung apices, with scattered bilateral peripheral blebs. Minimal bilateral scarring noted. 3. Scattered coronary artery calcifications seen. 4. Scattered calcification along the abdominal aorta and its branches. 5. Scattered diverticulosis along the sigmoid colon, without evidence of diverticulitis. Electronically Signed   By: Garald Balding M.D.   On: 06/17/2015 02:03   Dg Chest Port 1 View  06/16/2015  CLINICAL DATA:  Fever, altered mental status.  Initial encounter. EXAM: PORTABLE CHEST 1 VIEW COMPARISON:  None. FINDINGS: The lungs are clear. Heart size is normal. No pneumothorax or pleural effusion. Calcified breast implant on the right is  noted. IMPRESSION: No acute disease. Electronically Signed   By: Inge Rise M.D.   On: 06/16/2015 18:57   I have personally reviewed and evaluated these images and lab results as part of my medical decision-making.   EKG Interpretation   Date/Time:  Sunday June 16 2015 16:14:35 EST Ventricular Rate:  82 PR Interval:  147 QRS Duration: 144 QT Interval:  374 QTC Calculation: 437 R Axis:   63 Text Interpretation:  Sinus rhythm Left bundle branch block, old Confirmed  by Palmina Clodfelter  MD, [place] ([id number]) on 06/16/2015 4:21:56 PM      MDM   Final diagnoses:  Sepsis, due to unspecified organism [facility])  UTI (lower urinary tract infection)    65 year old female with altered mental status. Noted to be febrile. Urinalysis is consistent with urinary tract infection with many bacteria, no squam cells noted, large leuk, too numerous to count WBC and blood. PCN allergy. Cipro. Symptoms seem most consistent with toxic encephalopathy related to UTI. Globally weak. No focal motor deficits.  Neuro imaging deferred.    Virgel Manifold, MD 06/18/15 [phone]

## 2015-06-17 DIAGNOSIS — E440 Moderate protein-calorie malnutrition: Secondary | ICD-10-CM | POA: Insufficient documentation

## 2015-06-17 LAB — CBC WITH DIFFERENTIAL/PLATELET
BASOS ABS: 0 10*3/uL (ref 0.0–0.1)
Basophils Relative: 0 %
EOS ABS: 0 10*3/uL (ref 0.0–0.7)
EOS PCT: 0 %
HCT: 32.8 % — ABNORMAL LOW (ref 36.0–46.0)
HEMOGLOBIN: 10.8 g/dL — AB (ref 12.0–15.0)
LYMPHS PCT: 16 %
Lymphs Abs: 0.6 10*3/uL — ABNORMAL LOW (ref 0.7–4.0)
MCH: 31.5 pg (ref 26.0–34.0)
MCHC: 32.9 g/dL (ref 30.0–36.0)
MCV: 95.6 fL (ref 78.0–100.0)
Monocytes Absolute: 0.2 10*3/uL (ref 0.1–1.0)
Monocytes Relative: 6 %
NEUTROS PCT: 77 %
Neutro Abs: 2.8 10*3/uL (ref 1.7–7.7)
PLATELETS: 113 10*3/uL — AB (ref 150–400)
RBC: 3.43 MIL/uL — AB (ref 3.87–5.11)
RDW: 13.4 % (ref 11.5–15.5)
WBC: 3.7 10*3/uL — AB (ref 4.0–10.5)

## 2015-06-17 LAB — COMPREHENSIVE METABOLIC PANEL
ALBUMIN: 2.4 g/dL — AB (ref 3.5–5.0)
ALK PHOS: 50 U/L (ref 38–126)
ALT: 18 U/L (ref 14–54)
ANION GAP: 5 (ref 5–15)
AST: 20 U/L (ref 15–41)
BUN: 26 mg/dL — ABNORMAL HIGH (ref 6–20)
CALCIUM: 7.8 mg/dL — AB (ref 8.9–10.3)
CO2: 23 mmol/L (ref 22–32)
Chloride: 111 mmol/L (ref 101–111)
Creatinine, Ser: 1.18 mg/dL — ABNORMAL HIGH (ref 0.44–1.00)
GFR calc non Af Amer: 48 mL/min — ABNORMAL LOW (ref >60)
GFR, EST AFRICAN AMERICAN: 56 mL/min — AB (ref >60)
GLUCOSE: 127 mg/dL — AB (ref 65–99)
POTASSIUM: 4 mmol/L (ref 3.5–5.1)
SODIUM: 139 mmol/L (ref 135–145)
Total Bilirubin: 0.5 mg/dL (ref 0.3–1.2)
Total Protein: 5.2 g/dL — ABNORMAL LOW (ref 6.5–8.1)

## 2015-06-17 LAB — PROTIME-INR
INR: 1.31 (ref 0.00–1.49)
Prothrombin Time: 16.4 seconds — ABNORMAL HIGH (ref 11.6–15.2)

## 2015-06-17 LAB — PHOSPHORUS: PHOSPHORUS: 1.9 mg/dL — AB (ref 2.5–4.6)

## 2015-06-17 LAB — MAGNESIUM: Magnesium: 1.9 mg/dL (ref 1.7–2.4)

## 2015-06-17 LAB — APTT: APTT: 39 s — AB (ref 24–37)

## 2015-06-17 LAB — TSH: TSH: 0.121 u[IU]/mL — AB (ref 0.350–4.500)

## 2015-06-17 MED ORDER — CIPROFLOXACIN IN D5W 400 MG/200ML IV SOLN
400.0000 mg | Freq: Once | INTRAVENOUS | Status: AC
  Administered 2015-06-17: 400 mg via INTRAVENOUS
  Filled 2015-06-17: qty 200

## 2015-06-17 MED ORDER — KETOROLAC TROMETHAMINE 15 MG/ML IJ SOLN
15.0000 mg | Freq: Once | INTRAMUSCULAR | Status: AC
  Administered 2015-06-17: 15 mg via INTRAVENOUS
  Filled 2015-06-17: qty 1

## 2015-06-17 MED ORDER — DIPHENHYDRAMINE HCL 50 MG/ML IJ SOLN: 25.0000 mg | Freq: Once | INTRAMUSCULAR | Status: DC

## 2015-06-17 MED ORDER — VANCOMYCIN HCL 500 MG IV SOLR
500.0000 mg | Freq: Two times a day (BID) | INTRAVENOUS | Status: DC
  Administered 2015-06-17: 500 mg via INTRAVENOUS
  Filled 2015-06-17: qty 500

## 2015-06-17 MED ORDER — DIPHENHYDRAMINE HCL 50 MG PO CAPS
50.0000 mg | ORAL_CAPSULE | Freq: Once | ORAL | Status: AC
  Administered 2015-06-17: 50 mg via ORAL
  Filled 2015-06-17: qty 1

## 2015-06-17 MED ORDER — OXYCODONE-ACETAMINOPHEN 5-325 MG PO TABS
1.0000 | ORAL_TABLET | ORAL | Status: DC | PRN
  Administered 2015-06-17 – 2015-06-19 (×7): 1 via ORAL
  Filled 2015-06-17 (×7): qty 1

## 2015-06-17 MED ORDER — DIPHENHYDRAMINE HCL 25 MG PO CAPS
25.0000 mg | ORAL_CAPSULE | Freq: Four times a day (QID) | ORAL | Status: DC | PRN
  Administered 2015-06-17 – 2015-06-19 (×6): 25 mg via ORAL
  Filled 2015-06-17 (×6): qty 1

## 2015-06-17 NOTE — Progress Notes (Signed)
PROGRESS NOTE  [name] [id number] DOB: 1950/11/15 DOA: 06/16/2015 PCP: Alesia Richards, MD  HPI/Recap of past 24 hours: 65 year female past oral history of hypertension, hypothyroidism major depressive disorder admitted on 2/19 for dysuria, fever and weakness. Patient found to have sepsis secondary to large urinary tract infection. Started on IV antibiotics. Initially some confusion.  Today, hurting all over, and complains of fatigue and weakness  Assessment/Plan: Principal Problem:   Sepsis ([place]) of unknown organism secondary to urinary tract infection: Patient meets criteria for sepsis given tachycardia, hypotension, fever and endorgan response including renal failure and metabolic encephalopathy with urinary source: Have switched her antibiotics antibiotics to Cipro. Allergic to cephalosporins, but urinalysis from last year noted pansensitive Escherichia coli. Awaiting blood and urine cultures. The meantime, patient no longer febrile. Confusion resolved Active Problems:   Hypothyroidism: Continue Synthroid   COPD exacerbation ([place]): Stable, no signs of respiratory source   LBBB (left bundle branch block)   HTN (hypertension)    AKI (acute kidney injury) ([place]): Secondary to hypotension from sepsis, improving with IV fluids.    Malnutrition of moderate degree: Patient meets criteria for non-severe malnutrition the context of chronic illness. Seen by nutrition. Started on daily snacks plus Carnation instant breakfast twice a day   Code Status: Full code  Family Communication: Sister at the bedside   Disposition Plan: Anticipate discharge in the next 2-3 days once blood pressure better stabilizes    Consultants:  None   Procedures:  None  Antibiotics:  IV Cipro 2/19-present  IV vancomycin 2/19-2/20  IV aztreonam 2/19-2/20  IV Zithromax 2/19-2/20   Objective: BP 87/40 mmHg  Pulse 77  Temp(Src) 98.2 F (36.8 C) (Oral)  Resp 18  Ht 5\' 5"  (1.651 m)   Wt 69.536 kg (153 lb 4.8 oz)  BMI 25.51 kg/m2  SpO2 92%  Intake/Output Summary (Last 24 hours) at 06/17/15 1608 Last data filed at 06/17/15 1120  Gross per 24 hour  Intake      0 ml  Output     50 ml  Net    -50 ml   Filed Weights   06/16/15 2024  Weight: 69.536 kg (153 lb 4.8 oz)    Exam:   General:  Alert and oriented 3, fatigue   Cardiovascular: Regular rate and rhythm, S1-S2   Respiratory: Decreased breath sounds throughout   Abdomen: Soft, nontender, nondistended, positive bowel sounds   Musculoskeletal: Trace pitting edema    Data Reviewed: Basic Metabolic Panel:  Recent Labs Lab 06/16/15 1629 06/17/15 0404  NA 135 139  K 4.3 4.0  CL 102 111  CO2 24 23  GLUCOSE 121* 127*  BUN 32* 26*  CREATININE 1.50* 1.18*  CALCIUM 8.5* 7.8*  MG  --  1.9  PHOS  --  1.9*   Liver Function Tests:  Recent Labs Lab 06/16/15 1629 06/17/15 0404  AST 28 20  ALT 23 18  ALKPHOS 67 50  BILITOT 1.0 0.5  PROT 6.4* 5.2*  ALBUMIN 3.2* 2.4*   No results for input(s): LIPASE, AMYLASE in the last 168 hours. No results for input(s): AMMONIA in the last 168 hours. CBC:  Recent Labs Lab 06/16/15 1629 06/17/15 0404  WBC 6.7 3.7*  NEUTROABS 5.6 2.8  HGB 13.6 10.8*  HCT 40.8 32.8*  MCV 93.8 95.6  PLT 151 113*   Cardiac Enzymes:   No results for input(s): CKTOTAL, CKMB, CKMBINDEX, TROPONINI in the last 168 hours. BNP (last 3 results) No results for input(s):  BNP in the last 8760 hours.  ProBNP (last 3 results) No results for input(s): PROBNP in the last 8760 hours.  CBG: No results for input(s): GLUCAP in the last 168 hours.  Recent Results (from the past 240 hour(s))  Blood culture (routine x 2)     Status: None (Preliminary result)   Collection Time: 06/16/15  4:29 PM  Result Value Ref Range Status   Specimen Description BLOOD RIGHT ANTECUBITAL  Final   Special Requests BOTTLES DRAWN AEROBIC AND ANAEROBIC 5 CC EACH  Final   Culture   Final    NO GROWTH <  24 HOURS Performed at [facility]    Report Status PENDING  Incomplete  Blood culture (routine x 2)     Status: None (Preliminary result)   Collection Time: 06/16/15  4:30 PM  Result Value Ref Range Status   Specimen Description BLOOD LEFT HAND  Final   Special Requests BOTTLES DRAWN AEROBIC ONLY 5CC  Final   Culture   Final    NO GROWTH < 24 HOURS Performed at [facility]    Report Status PENDING  Incomplete     Studies: Ct Abdomen Pelvis Wo Contrast  06/17/2015  CLINICAL DATA:  Acute onset of sepsis.  Initial encounter. EXAM: CT CHEST, ABDOMEN AND PELVIS WITHOUT CONTRAST TECHNIQUE: Multidetector CT imaging of the chest, abdomen and pelvis was performed following the standard protocol without IV contrast. COMPARISON:  None. FINDINGS: CT CHEST Emphysematous change is noted at the lung apices. Scattered peripheral blebs are seen bilaterally. Minimal bilateral scarring is seen. No pleural effusion or pneumothorax is identified. No masses are seen. Scattered coronary artery calcifications are seen. Scattered calcification is noted along the aortic arch. No pericardial effusion is identified. The great vessels are grossly unremarkable in appearance. The thyroid gland is grossly unremarkable. No axillary lymphadenopathy is seen. Diffuse calcification is noted about the breast implants, more prominent on the right. No acute osseous abnormalities are identified. CT ABDOMEN AND PELVIS The liver and spleen are unremarkable in appearance. The gallbladder is within normal limits. The pancreas and adrenal glands are unremarkable. Nonspecific perinephric stranding is noted on the right, with mildly asymmetric right-sided pelvicaliectasis. No hydronephrosis is seen. Would correlate clinically for any evidence of pyelonephritis. No renal or ureteral stones are identified. No free fluid is identified. The small bowel is unremarkable in appearance. The stomach is within normal limits. No acute  vascular abnormalities are seen. Scattered calcification is noted along the abdominal aorta and its branches. The appendix is not definitely characterized; there is no evidence for appendicitis. Scattered diverticulosis is noted along the sigmoid colon, without evidence of diverticulitis. The bladder is mildly distended and grossly unremarkable. The patient is status post hysterectomy. No suspicious adnexal masses are seen. No inguinal lymphadenopathy is seen. No acute osseous abnormalities are identified. Vacuum phenomenon is noted at L4-L5. IMPRESSION: 1. Right-sided perinephric stranding, with mildly asymmetric right-sided pelvicaliectasis. Would correlate clinically for any evidence of pyelonephritis. 2. Emphysematous change at the lung apices, with scattered bilateral peripheral blebs. Minimal bilateral scarring noted. 3. Scattered coronary artery calcifications seen. 4. Scattered calcification along the abdominal aorta and its branches. 5. Scattered diverticulosis along the sigmoid colon, without evidence of diverticulitis. Electronically Signed   By: Garald Balding M.D.   On: 06/17/2015 02:03   Ct Chest Wo Contrast  06/17/2015  CLINICAL DATA:  Acute onset of sepsis.  Initial encounter. EXAM: CT CHEST, ABDOMEN AND PELVIS WITHOUT CONTRAST TECHNIQUE: Multidetector CT imaging of the  chest, abdomen and pelvis was performed following the standard protocol without IV contrast. COMPARISON:  None. FINDINGS: CT CHEST Emphysematous change is noted at the lung apices. Scattered peripheral blebs are seen bilaterally. Minimal bilateral scarring is seen. No pleural effusion or pneumothorax is identified. No masses are seen. Scattered coronary artery calcifications are seen. Scattered calcification is noted along the aortic arch. No pericardial effusion is identified. The great vessels are grossly unremarkable in appearance. The thyroid gland is grossly unremarkable. No axillary lymphadenopathy is seen. Diffuse  calcification is noted about the breast implants, more prominent on the right. No acute osseous abnormalities are identified. CT ABDOMEN AND PELVIS The liver and spleen are unremarkable in appearance. The gallbladder is within normal limits. The pancreas and adrenal glands are unremarkable. Nonspecific perinephric stranding is noted on the right, with mildly asymmetric right-sided pelvicaliectasis. No hydronephrosis is seen. Would correlate clinically for any evidence of pyelonephritis. No renal or ureteral stones are identified. No free fluid is identified. The small bowel is unremarkable in appearance. The stomach is within normal limits. No acute vascular abnormalities are seen. Scattered calcification is noted along the abdominal aorta and its branches. The appendix is not definitely characterized; there is no evidence for appendicitis. Scattered diverticulosis is noted along the sigmoid colon, without evidence of diverticulitis. The bladder is mildly distended and grossly unremarkable. The patient is status post hysterectomy. No suspicious adnexal masses are seen. No inguinal lymphadenopathy is seen. No acute osseous abnormalities are identified. Vacuum phenomenon is noted at L4-L5. IMPRESSION: 1. Right-sided perinephric stranding, with mildly asymmetric right-sided pelvicaliectasis. Would correlate clinically for any evidence of pyelonephritis. 2. Emphysematous change at the lung apices, with scattered bilateral peripheral blebs. Minimal bilateral scarring noted. 3. Scattered coronary artery calcifications seen. 4. Scattered calcification along the abdominal aorta and its branches. 5. Scattered diverticulosis along the sigmoid colon, without evidence of diverticulitis. Electronically Signed   By: Garald Balding M.D.   On: 06/17/2015 02:03   Dg Chest Port 1 View  06/16/2015  CLINICAL DATA:  Fever, altered mental status.  Initial encounter. EXAM: PORTABLE CHEST 1 VIEW COMPARISON:  None. FINDINGS: The lungs  are clear. Heart size is normal. No pneumothorax or pleural effusion. Calcified breast implant on the right is noted. IMPRESSION: No acute disease. Electronically Signed   By: Inge Rise M.D.   On: 06/16/2015 18:57    Scheduled Meds: . ciprofloxacin  400 mg Intravenous Once  . enoxaparin (LOVENOX) injection  40 mg Subcutaneous QHS  . pantoprazole (PROTONIX) IV  40 mg Intravenous QHS    Continuous Infusions: . sodium chloride 125 mL/hr at 06/16/15 2305     Time spent: 25 minutes   [facility] Pager [phone] . If 7PM-7AM, please contact night-coverage at [email or web address], password [email or web address] 06/17/2015, 4:08 PM  LOS: 1 day

## 2015-06-17 NOTE — Progress Notes (Signed)
Pharmacy Antibiotic Note  [name] is a 65 y.o. female admitted on 06/16/2015 with suspected sepsis, urinary source.  Pharmacy has been consulted for Vancomycin dosing.  Plan:  Azithromycin 500mg  IV q24h x3 days per MD  Aztreonam 2g IV q8h per MD  Increase Vancomycin to 500mg  IV q12h.  Measure Vanc trough at steady state.  Follow up renal fxn, culture results, and clinical course.  Follow up antibiotic de-escalation.   Height: 5\' 5"  (165.1 cm) Weight: 153 lb 4.8 oz (69.536 kg) IBW/kg (Calculated) : 57  Temp (24hrs), Avg:100.5 F (38.1 C), Min:98.7 F (37.1 C), Max:102.8 F (39.3 C)   Recent Labs Lab 06/16/15 1629 06/16/15 1639 06/17/15 0404  WBC 6.7  --  3.7*  CREATININE 1.50*  --  1.18*  LATICACIDVEN  --  1.22  --     Estimated Creatinine Clearance: 47.8 mL/min (by C-G formula based on Cr of 1.18).    Allergies  Allergen Reactions  . Ace Inhibitors Other (See Comments)    Acute renal failure (lisinopril)  . Cephalexin Anaphylaxis  . Macrobid [Nitrofurantoin] Other (See Comments)    Causes hyperthermia   . Codeine Nausea Only  . Sulfa Antibiotics Rash    Antimicrobials this admission: Cipro 2/19 x1 dose Vancomycin 2/19 >>  Aztreonam 2/19 >>  Azithromycin 2/19 >>   Dose adjustments this admission: 2/20 Vanc dose increased for improved renal function  Microbiology results: 2/19 BCx: sent 2/19 UCx: sent    Thank you for allowing pharmacy to be a part of this patient's care.  Gretta Arab PharmD, BCPS Pager [phone] 06/17/2015 8:35 AM

## 2015-06-17 NOTE — Progress Notes (Signed)
Initial Nutrition Assessment  DOCUMENTATION CODES:   Non-severe (moderate) malnutrition in context of chronic illness  INTERVENTION:  -Provide Carnation Instant Breakfast BID, 220 kcal, 13 grams of protein per serving  -Provide daily snacks  NUTRITION DIAGNOSIS:   Inadequate oral intake related to poor appetite as evidenced by energy intake < or equal to 75% for > or equal to 1 month, percent weight loss.  GOAL:   Patient will meet greater than or equal to 90% of their needs  MONITOR:   PO intake, Supplement acceptance, Labs, Weight trends  REASON FOR ASSESSMENT:   Malnutrition Screening Tool  ASSESSMENT:   Pt with essential hypertension/hypothyroidism/CKD Stage 3 who comes in with generalised weakness for the last 3 days, and this is associated with dysuria, cold like symptoms, altered sensorium and fever, and she is found to have Sepsis with metabolic encephalopathy   Pt seen for MST. Pt states her appetite has been poor for 2 months. She has not been able to eat anything since decreased appetite began. She reports she does not like Ensure Enlive, will d/c. She states she would prefer [employer]. Pt states her sister is willing to bring her [employer] into the hospital, will also provide this supplement while she is here. Pt states fruit settles her stomach offered to provide fruit as snack in between meals. Will order daily snacks BID.   Pt states she has lost 25 lb within 2 months. Per chart she has lost 19 lb (11%) within 4 months, which is significant for time frame.  Unable to conduct nutrition focused physical exam, will conduct on follow up. Pt present with agitation.   Medications reviewed. Labs reviewed; Phosphorus 1.9  Diet Order:  Diet Heart Room service appropriate?: Yes; Fluid consistency:: Thin  Skin:  Reviewed, no issues  Last BM:  Unkown  Height:   Ht Readings from Last 1 Encounters:  06/16/15 5\' 5"  (1.651 m)     Weight:   Wt Readings from Last 1 Encounters:  06/16/15 153 lb 4.8 oz (69.536 kg)    Ideal Body Weight:  56.8 kg  BMI:  Body mass index is 25.51 kg/(m^2).  Estimated Nutritional Needs:   Kcal:  1700-1900  Protein:  85-95   Fluid:  1.7-1.9 L  EDUCATION NEEDS:   No education needs identified at this time  [place], Dietetic Intern Pager: [phone]

## 2015-06-18 ENCOUNTER — Inpatient Hospital Stay: Admission: RE | Admit: 2015-06-18 | Payer: 59 | Source: Ambulatory Visit

## 2015-06-18 ENCOUNTER — Inpatient Hospital Stay (HOSPITAL_COMMUNITY): Payer: 59

## 2015-06-18 LAB — CBC
HCT: 35.2 % — ABNORMAL LOW (ref 36.0–46.0)
Hemoglobin: 11.4 g/dL — ABNORMAL LOW (ref 12.0–15.0)
MCH: 31.1 pg (ref 26.0–34.0)
MCHC: 32.4 g/dL (ref 30.0–36.0)
MCV: 95.9 fL (ref 78.0–100.0)
Platelets: 127 10*3/uL — ABNORMAL LOW (ref 150–400)
RBC: 3.67 MIL/uL — ABNORMAL LOW (ref 3.87–5.11)
RDW: 13.2 % (ref 11.5–15.5)
WBC: 2.9 10*3/uL — ABNORMAL LOW (ref 4.0–10.5)

## 2015-06-18 LAB — BASIC METABOLIC PANEL
Anion gap: 5 (ref 5–15)
BUN: 20 mg/dL (ref 6–20)
CALCIUM: 8 mg/dL — AB (ref 8.9–10.3)
CO2: 23 mmol/L (ref 22–32)
CREATININE: 1.06 mg/dL — AB (ref 0.44–1.00)
Chloride: 110 mmol/L (ref 101–111)
GFR calc Af Amer: >60 mL/min (ref >60)
GFR, EST NON AFRICAN AMERICAN: 55 mL/min — AB (ref >60)
GLUCOSE: 116 mg/dL — AB (ref 65–99)
Potassium: 3.7 mmol/L (ref 3.5–5.1)
Sodium: 138 mmol/L (ref 135–145)

## 2015-06-18 LAB — BRAIN NATRIURETIC PEPTIDE: B NATRIURETIC PEPTIDE 5: 233.7 pg/mL — AB (ref 0.0–100.0)

## 2015-06-18 MED ORDER — PANTOPRAZOLE SODIUM 40 MG PO TBEC
40.0000 mg | DELAYED_RELEASE_TABLET | Freq: Every day | ORAL | Status: DC
  Administered 2015-06-18: 40 mg via ORAL
  Filled 2015-06-18: qty 1

## 2015-06-18 MED ORDER — CIPROFLOXACIN IN D5W 400 MG/200ML IV SOLN
400.0000 mg | Freq: Two times a day (BID) | INTRAVENOUS | Status: DC
  Administered 2015-06-18 (×2): 400 mg via INTRAVENOUS
  Filled 2015-06-18 (×3): qty 200

## 2015-06-18 NOTE — Progress Notes (Signed)
Pharmacy IV to PO conversion  The patient is receiving PANTOPRAZOLE by the intravenous route.  Based on criteria approved by the Pharmacy and [place], the medication is being converted to the equivalent oral dose form.   No active GI bleeding or impaired absorption  Not s/p esophagectomy  Documented ability to take oral medications for > 24 hr  Plan to continue treatment for at least 1 day  If you have any questions about this conversion, please contact the Pharmacy Department (ext [phone]).  Thank you.  Reuel Boom, PharmD Pager: [phone] 06/18/2015, 10:32 AM

## 2015-06-18 NOTE — Progress Notes (Signed)
PROGRESS NOTE  [name] [id number] DOB: Aug 20, 1950 DOA: 06/16/2015 PCP: Alesia Richards, MD  HPI/Recap of past 24 hours: 65 year female past oral history of hypertension, hypothyroidism major depressive disorder admitted on 2/19 for dysuria, fever and weakness. Patient found to have sepsis secondary to large urinary tract infection. Started on IV antibiotics. Initially some confusion.  Urine cultures came back for Escherichia coli, sensitivities pending. Patient herself improving.  Today, feeling much better, more like herself. Less overall aching, only mild fatigue  Assessment/Plan: Principal Problem:   Sepsis ([place]) of unknown organism secondary to urinary tract infection: Patient meets criteria for sepsis given tachycardia, hypotension, fever and endorgan response including renal failure and metabolic encephalopathy with urinary source: Have switched her antibiotics antibiotics to Cipro. Allergic to cephalosporins, but urinalysis from last year noted pansensitive Escherichia coli. Awaiting blood cultures and urine cultures sensitivities. Patient continues to significantly improve Confusion resolved  Patient's PCP had ordered a head CT as an outpatient scheduled for today 2/21 secondary to episodes of confusion, possibly felt to be from depression. We'll go ahead and order while inpatient  Active Problems:   Hypothyroidism: Continue Synthroid   COPD exacerbation ([place]): Stable, no signs of respiratory source   LBBB (left bundle branch block)   HTN (hypertension)  Chronic diastolic heart failure: Patient still on dry side. We'll check BNP tomorrow morning    AKI (acute kidney injury) ([place]): Secondary to hypotension from sepsis, nearly resolved now with IV fluids    Malnutrition of moderate degree: Patient meets criteria for non-severe malnutrition the context of chronic illness. Seen by nutrition. Started on daily snacks plus Carnation instant breakfast twice a day   Code  Status: Full code  Family Communication: Sister and daughter and dog at the bedside  Disposition: Anticipate potential discharge tomorrow as long as blood pressure stabilizes, BNP stable and urine cultures back    Consultants:  None   Procedures:  None  Antibiotics:  IV Cipro 2/19-present  IV vancomycin 2/19-2/20  IV aztreonam 2/19-2/20  IV Zithromax 2/19-2/20   Objective: BP 129/72 mmHg  Pulse 71  Temp(Src) 98.6 F (37 C) (Oral)  Resp 16  Ht 5\' 5"  (1.651 m)  Wt 69.536 kg (153 lb 4.8 oz)  BMI 25.51 kg/m2  SpO2 96%  Intake/Output Summary (Last 24 hours) at 06/18/15 1537 Last data filed at 06/18/15 1505  Gross per 24 hour  Intake   4321 ml  Output    200 ml  Net   4121 ml   Filed Weights   06/16/15 2024  Weight: 69.536 kg (153 lb 4.8 oz)    Exam:   General:  Alert and oriented 3,  no acute distress  Cardiovascular: Regular rate and rhythm, S1-S2   Respiratory:  clear to auscultation bilaterally   Abdomen: Soft, nontender, nondistended, positive bowel sounds   Musculoskeletal: Trace pitting edema    Data Reviewed: Basic Metabolic Panel:  Recent Labs Lab 06/16/15 1629 06/17/15 0404 06/18/15 0359  NA 135 139 138  K 4.3 4.0 3.7  CL 102 111 110  CO2 24 23 23   GLUCOSE 121* 127* 116*  BUN 32* 26* 20  CREATININE 1.50* 1.18* 1.06*  CALCIUM 8.5* 7.8* 8.0*  MG  --  1.9  --   PHOS  --  1.9*  --    Liver Function Tests:  Recent Labs Lab 06/16/15 1629 06/17/15 0404  AST 28 20  ALT 23 18  ALKPHOS 67 50  BILITOT 1.0 0.5  PROT 6.4*  5.2*  ALBUMIN 3.2* 2.4*   No results for input(s): LIPASE, AMYLASE in the last 168 hours. No results for input(s): AMMONIA in the last 168 hours. CBC:  Recent Labs Lab 06/16/15 1629 06/17/15 0404 06/18/15 0359  WBC 6.7 3.7* 2.9*  NEUTROABS 5.6 2.8  --   HGB 13.6 10.8* 11.4*  HCT 40.8 32.8* 35.2*  MCV 93.8 95.6 95.9  PLT 151 113* 127*   Cardiac Enzymes:   No results for input(s): CKTOTAL, CKMB,  CKMBINDEX, TROPONINI in the last 168 hours. BNP (last 3 results) No results for input(s): BNP in the last 8760 hours.  ProBNP (last 3 results) No results for input(s): PROBNP in the last 8760 hours.  CBG: No results for input(s): GLUCAP in the last 168 hours.  Recent Results (from the past 240 hour(s))  Blood culture (routine x 2)     Status: None (Preliminary result)   Collection Time: 06/16/15  4:29 PM  Result Value Ref Range Status   Specimen Description BLOOD RIGHT ANTECUBITAL  Final   Special Requests BOTTLES DRAWN AEROBIC AND ANAEROBIC 5 CC EACH  Final   Culture   Final    NO GROWTH 2 DAYS Performed at [facility]    Report Status PENDING  Incomplete  Blood culture (routine x 2)     Status: None (Preliminary result)   Collection Time: 06/16/15  4:30 PM  Result Value Ref Range Status   Specimen Description BLOOD LEFT HAND  Final   Special Requests BOTTLES DRAWN AEROBIC ONLY 5CC  Final   Culture   Final    NO GROWTH 2 DAYS Performed at [facility]    Report Status PENDING  Incomplete  Urine culture     Status: None (Preliminary result)   Collection Time: 06/16/15  6:07 PM  Result Value Ref Range Status   Specimen Description URINE, CLEAN CATCH  Final   Special Requests NONE  Final   Culture   Final    >=100,000 COLONIES/mL ESCHERICHIA COLI Performed at [facility]    Report Status PENDING  Incomplete     Studies: No results found.  Scheduled Meds: . ciprofloxacin  400 mg Intravenous Q12H  . enoxaparin (LOVENOX) injection  40 mg Subcutaneous QHS  . pantoprazole  40 mg Oral QHS    Continuous Infusions: . sodium chloride 125 mL/hr at 06/17/15 2210     Time spent: 25 minutes   [facility] Pager [phone] . If 7PM-7AM, please contact night-coverage at [email or web address], password [facility] 06/18/2015, 3:37 PM  LOS: 2 days

## 2015-06-19 DIAGNOSIS — N390 Urinary tract infection, site not specified: Secondary | ICD-10-CM

## 2015-06-19 DIAGNOSIS — N179 Acute kidney failure, unspecified: Secondary | ICD-10-CM

## 2015-06-19 DIAGNOSIS — E038 Other specified hypothyroidism: Secondary | ICD-10-CM

## 2015-06-19 DIAGNOSIS — G9341 Metabolic encephalopathy: Secondary | ICD-10-CM

## 2015-06-19 DIAGNOSIS — D696 Thrombocytopenia, unspecified: Secondary | ICD-10-CM | POA: Insufficient documentation

## 2015-06-19 DIAGNOSIS — E440 Moderate protein-calorie malnutrition: Secondary | ICD-10-CM

## 2015-06-19 DIAGNOSIS — A4151 Sepsis due to Escherichia coli [E. coli]: Secondary | ICD-10-CM

## 2015-06-19 LAB — BASIC METABOLIC PANEL
ANION GAP: 7 (ref 5–15)
BUN: 12 mg/dL (ref 6–20)
CHLORIDE: 107 mmol/L (ref 101–111)
CO2: 26 mmol/L (ref 22–32)
Calcium: 8.1 mg/dL — ABNORMAL LOW (ref 8.9–10.3)
Creatinine, Ser: 1 mg/dL (ref 0.44–1.00)
GFR calc non Af Amer: 59 mL/min — ABNORMAL LOW (ref >60)
Glucose, Bld: 109 mg/dL — ABNORMAL HIGH (ref 65–99)
POTASSIUM: 4 mmol/L (ref 3.5–5.1)
Sodium: 140 mmol/L (ref 135–145)

## 2015-06-19 LAB — BRAIN NATRIURETIC PEPTIDE: B Natriuretic Peptide: 187.8 pg/mL — ABNORMAL HIGH (ref 0.0–100.0)

## 2015-06-19 LAB — URINE CULTURE: Culture: >=100000

## 2015-06-19 MED ORDER — CIPROFLOXACIN HCL 500 MG PO TABS
500.0000 mg | ORAL_TABLET | Freq: Two times a day (BID) | ORAL | Status: DC
  Administered 2015-06-19: 500 mg via ORAL
  Filled 2015-06-19: qty 1

## 2015-06-19 MED ORDER — CIPROFLOXACIN HCL 500 MG PO TABS: 500.0000 mg | ORAL_TABLET | Freq: Two times a day (BID) | ORAL | Status: DC

## 2015-06-19 MED FILL — CIPROFLOXACIN HCL 500 MG TA: 500 | 5 days supply | Qty: 10 | Fill #0

## 2015-06-19 NOTE — Discharge Summary (Signed)
Physician Discharge Summary  [name] [id number] DOB: [birth year] DOA: 06/16/2015  PCP: Alesia Richards, MD  Admit date: 06/16/2015 Discharge date: 06/19/2015  Time spent: 50 minutes  Recommendations for Outpatient Follow-up:  1. Due to hypotension, Atenolol is on hold- request PCP to recheck BP in 1-2 wks and resume as needed  2. Please recheck TSH in 2 wks as it was noted to be low at 0.121    Discharge Condition: Stable    Discharge Diagnoses:  Principal Problem:   Sepsis ([place]) Active Problems:   AKI (acute kidney injury) ([place])   UTI (lower urinary tract infection)   Encephalopathy, metabolic   Thrombocytopenia ([facility])   Hypothyroidism   COPD exacerbation ([facility])   LBBB (left bundle branch block)   HTN (hypertension)   CKD Stage 3 (GFR 55 ml/min)   Malnutrition of moderate degree   Hypotension   History of present illness:  This is a 65 year old female with a history of hypertension, hypothyroidism, depression who presented to the hospital with a complaint of generalized weakness. She was also noted to be confused and febrile with a temperature of 101. She complained of dysuria and was noted to have a UA that was positive for infection and therefore admitted for treatment of sepsis secondary to UTI. She also admitted to have cold-like symptoms and cough and had vomiting.  Hospital Course:  Sepsis secondary to UTI/pyelonephritis -The patient was tachycardic, hypotensive and febrile with metabolic encephalopathy -She was started on ciprofloxacin for UTI and cultures reveal Escherichia coli that is sensitive to ciprofloxacin -Has been transitioned from IV to oral ciprofloxacin today and will complete a 7 day course -Blood cultures are negative -CT of chest abdomen and pelvis obtained in the ER revealed right-sided perinephric stranding with mild asymmetric right-sided pelvicaliectasis- please see full report mentioned below  Acute renal failure -Secondary to  pyelonephritis and possibly dehydration -BUN 32 with a creatinine of 1.50 on admission -This has improved to 12 and 1.0 today with treatment of infection and IV hydration  Acute thrombocytopenia -Suspected to be secondary to sepsis -Platelets were 313 on 12/16 and when last checked on 12/21 they were 127 -Recommend follow-up CBC and office in 1-2 weeks  Hypothyroidism -As mentioned above, TSH is noted to be low-this could be secondary to sick euthyroid syndrome and therefore I have not adjusted her dose of Synthroid-recommend repeat TSH in 2 weeks  COPD - No exacerbation noted  Hypertension -Patient has been either normotensive or hypotensive during the hospital stay and therefore atenolol has been on hold -Recommend rechecking BP and out patient setting in 1-2 weeks  Malnutrition of moderate degree -Seen by nutritionist and started on supplements-eating well now  Procedures:  None  Consultations:  None  Discharge Exam: [facility] Weights   06/16/15 2024 06/19/15 0147  Weight: 69.536 kg (153 lb 4.8 oz) 69.446 kg (153 lb 1.6 oz)   Filed Vitals:   06/18/15 2025 06/19/15 0418  BP: 111/62 118/65  Pulse: 71 59  Temp: 98.4 F (36.9 C) 98 F (36.7 C)  Resp: 18 16    General: AAO x 3, no distress Cardiovascular: RRR, no murmurs  Respiratory: clear to auscultation bilaterally GI: soft, non-tender, non-distended, bowel sound positive  Discharge Instructions You were cared for by a hospitalist during your hospital stay. If you have any questions about your discharge medications or the care you received while you were in the hospital after you are discharged, you can call the unit and asked to speak with  the hospitalist on call if the hospitalist that took care of you is not available. Once you are discharged, your primary care physician will handle any further medical issues. Please note that NO REFILLS for any discharge medications will be authorized once you are discharged, as it  is imperative that you return to your primary care physician (or establish a relationship with a primary care physician if you do not have one) for your aftercare needs so that they can reassess your need for medications and monitor your lab values.      Discharge Instructions    Diet - low sodium heart healthy    Complete by:  As directed      Discharge instructions    Complete by:  As directed   1. Check TSH in 2 wks 2. Check BP decide whether to resume Atenolol     Increase activity slowly    Complete by:  As directed             Medication List    STOP taking these medications        atenolol 100 MG tablet  Commonly known as:  TENORMIN      TAKE these medications        acetaminophen 500 MG tablet  Commonly known as:  TYLENOL  Take 500 mg by mouth every 6 (six) hours as needed for pain.     ALPRAZolam 0.5 MG tablet  Commonly known as:  XANAX  Take 1/2 to 1 tablet 3 x day if needed for anxiety or sleep     aspirin 81 MG chewable tablet  Chew 81 mg by mouth daily.     calcium carbonate 500 MG chewable tablet  Commonly known as:  TUMS - dosed in mg elemental calcium  Chew 1 tablet by mouth as needed for indigestion. Uses Rolaids     calcium carbonate 600 MG Tabs tablet  Commonly known as:  OS-CAL  Take 600 mg by mouth daily.     ciprofloxacin 500 MG tablet  Commonly known as:  CIPRO  Take 1 tablet (500 mg total) by mouth 2 (two) times daily.     famotidine 20 MG tablet  Commonly known as:  PEPCID  Take 1 tablet (20 mg total) by mouth 2 (two) times daily.     hyoscyamine 0.125 MG SL tablet  Commonly known as:  LEVSIN SL  Place 2 tablets (0.25 mg total) under the tongue every 4 (four) hours as needed. Stomach pain and bladder spasms     OSCIMIN SR 0.375 MG 12 hr tablet  Generic drug:  hyoscyamine  TAKE 1-2 TABLETS BY MOUTH TWICE DAILY     levothyroxine 125 MCG tablet  Commonly known as:  SYNTHROID, LEVOTHROID  TAKE 1 TABLET BY MOUTH ONCE DAILY      Magnesium 250 MG Tabs  Take 250 mg by mouth daily.     Melatonin 3 MG Tabs  Take 1 tablet by mouth as needed (sleep).     multivitamin with minerals tablet  Take 1 tablet by mouth daily.     ondansetron 8 MG tablet  Commonly known as:  ZOFRAN  TAKE 1 TABLET BY MOUTH 3 TIMES DAILY AS NEEDED FOR NAUSEA     OVER THE COUNTER MEDICATION  Black Kohosh 1 capsule daily.     SUPER B COMPLEX PO  Take by mouth daily.     Vitamin D3 1000 units Caps  Take 1,000 Units by mouth daily.  Vitamin D-3 5000 UNITS Tabs  Take 5,000 Units by mouth daily.       Allergies  Allergen Reactions  . Ace Inhibitors Other (See Comments)    Acute renal failure (lisinopril)  . Cephalexin Anaphylaxis  . Macrobid [Nitrofurantoin] Other (See Comments)    Causes hyperthermia   . Codeine Nausea Only  . Sulfa Antibiotics Rash   Follow-up Information    Follow up with Alesia Richards, MD.   Specialty:  Internal Medicine   Why:  in 1-2 wks   Contact information:   [address] [place] [address] [phone]        The results of significant diagnostics from this hospitalization (including imaging, microbiology, ancillary and laboratory) are listed below for reference.    Significant Diagnostic Studies: Ct Abdomen Pelvis Wo Contrast  06/17/2015  CLINICAL DATA:  Acute onset of sepsis.  Initial encounter. EXAM: CT CHEST, ABDOMEN AND PELVIS WITHOUT CONTRAST TECHNIQUE: Multidetector CT imaging of the chest, abdomen and pelvis was performed following the standard protocol without IV contrast. COMPARISON:  None. FINDINGS: CT CHEST Emphysematous change is noted at the lung apices. Scattered peripheral blebs are seen bilaterally. Minimal bilateral scarring is seen. No pleural effusion or pneumothorax is identified. No masses are seen. Scattered coronary artery calcifications are seen. Scattered calcification is noted along the aortic arch. No pericardial effusion is identified. The  great vessels are grossly unremarkable in appearance. The thyroid gland is grossly unremarkable. No axillary lymphadenopathy is seen. Diffuse calcification is noted about the breast implants, more prominent on the right. No acute osseous abnormalities are identified. CT ABDOMEN AND PELVIS The liver and spleen are unremarkable in appearance. The gallbladder is within normal limits. The pancreas and adrenal glands are unremarkable. Nonspecific perinephric stranding is noted on the right, with mildly asymmetric right-sided pelvicaliectasis. No hydronephrosis is seen. Would correlate clinically for any evidence of pyelonephritis. No renal or ureteral stones are identified. No free fluid is identified. The small bowel is unremarkable in appearance. The stomach is within normal limits. No acute vascular abnormalities are seen. Scattered calcification is noted along the abdominal aorta and its branches. The appendix is not definitely characterized; there is no evidence for appendicitis. Scattered diverticulosis is noted along the sigmoid colon, without evidence of diverticulitis. The bladder is mildly distended and grossly unremarkable. The patient is status post hysterectomy. No suspicious adnexal masses are seen. No inguinal lymphadenopathy is seen. No acute osseous abnormalities are identified. Vacuum phenomenon is noted at L4-L5. IMPRESSION: 1. Right-sided perinephric stranding, with mildly asymmetric right-sided pelvicaliectasis. Would correlate clinically for any evidence of pyelonephritis. 2. Emphysematous change at the lung apices, with scattered bilateral peripheral blebs. Minimal bilateral scarring noted. 3. Scattered coronary artery calcifications seen. 4. Scattered calcification along the abdominal aorta and its branches. 5. Scattered diverticulosis along the sigmoid colon, without evidence of diverticulitis. Electronically Signed   By: Garald Balding M.D.   On: 06/17/2015 02:03   Ct Head Wo  Contrast  06/18/2015  CLINICAL DATA:  History of hypertension and major depressive disorder. Sepsis secondary to urinary tract infection. EXAM: CT HEAD WITHOUT CONTRAST TECHNIQUE: Contiguous axial images were obtained from the base of the skull through the vertex without intravenous contrast. COMPARISON:  None. FINDINGS: No acute intracranial abnormality. Specifically, no intra or extra-axial hemorrhage, mass effect, mass lesion, or evidence of acute infarction. The Kist-white differentiation is maintained. No white matter abnormalities identified. The skull is intact. The visualized paranasal sinuses and mastoid air cells are clear. IMPRESSION:  Normal head CT P Electronically Signed   By: Curlene Dolphin M.D.   On: 06/18/2015 16:30   Ct Chest Wo Contrast  06/17/2015  CLINICAL DATA:  Acute onset of sepsis.  Initial encounter. EXAM: CT CHEST, ABDOMEN AND PELVIS WITHOUT CONTRAST TECHNIQUE: Multidetector CT imaging of the chest, abdomen and pelvis was performed following the standard protocol without IV contrast. COMPARISON:  None. FINDINGS: CT CHEST Emphysematous change is noted at the lung apices. Scattered peripheral blebs are seen bilaterally. Minimal bilateral scarring is seen. No pleural effusion or pneumothorax is identified. No masses are seen. Scattered coronary artery calcifications are seen. Scattered calcification is noted along the aortic arch. No pericardial effusion is identified. The great vessels are grossly unremarkable in appearance. The thyroid gland is grossly unremarkable. No axillary lymphadenopathy is seen. Diffuse calcification is noted about the breast implants, more prominent on the right. No acute osseous abnormalities are identified. CT ABDOMEN AND PELVIS The liver and spleen are unremarkable in appearance. The gallbladder is within normal limits. The pancreas and adrenal glands are unremarkable. Nonspecific perinephric stranding is noted on the right, with mildly asymmetric right-sided  pelvicaliectasis. No hydronephrosis is seen. Would correlate clinically for any evidence of pyelonephritis. No renal or ureteral stones are identified. No free fluid is identified. The small bowel is unremarkable in appearance. The stomach is within normal limits. No acute vascular abnormalities are seen. Scattered calcification is noted along the abdominal aorta and its branches. The appendix is not definitely characterized; there is no evidence for appendicitis. Scattered diverticulosis is noted along the sigmoid colon, without evidence of diverticulitis. The bladder is mildly distended and grossly unremarkable. The patient is status post hysterectomy. No suspicious adnexal masses are seen. No inguinal lymphadenopathy is seen. No acute osseous abnormalities are identified. Vacuum phenomenon is noted at L4-L5. IMPRESSION: 1. Right-sided perinephric stranding, with mildly asymmetric right-sided pelvicaliectasis. Would correlate clinically for any evidence of pyelonephritis. 2. Emphysematous change at the lung apices, with scattered bilateral peripheral blebs. Minimal bilateral scarring noted. 3. Scattered coronary artery calcifications seen. 4. Scattered calcification along the abdominal aorta and its branches. 5. Scattered diverticulosis along the sigmoid colon, without evidence of diverticulitis. Electronically Signed   By: Garald Balding M.D.   On: 06/17/2015 02:03   Ct Abdomen Pelvis W Contrast  05/30/2015  CLINICAL DATA:  Left lower quadrant abdominal wall mass or hernia. EXAM: CT ABDOMEN AND PELVIS WITH CONTRAST TECHNIQUE: Multidetector CT imaging of the abdomen and pelvis was performed using the standard protocol following bolus administration of intravenous contrast. CONTRAST:  153mL ISOVUE-300 IOPAMIDOL (ISOVUE-300) INJECTION 61% COMPARISON:  CT scan of May 30, 2013. FINDINGS: Severe degenerative disc disease is noted at L4-5. Visualized lung bases are unremarkable. No gallstones are noted. The liver,  spleen and pancreas appear normal. Adrenal glands and kidneys appear normal. No hydronephrosis or renal obstruction is noted. Atherosclerosis of abdominal aorta is noted without aneurysm formation. Stool is noted throughout the colon suggesting constipation. No abnormal bowel dilatation is noted. No abnormal fluid collection is noted. No definite evidence of hernia or mass is noted. Urinary bladder appears normal. Status post hysterectomy. No significant adenopathy is noted. IMPRESSION: Atherosclerosis of abdominal aorta without aneurysm formation. Stool is noted throughout the colon suggesting constipation. No definite evidence of abdominal wall hernia or mass is noted. Electronically Signed   By: Marijo Conception, M.D.   On: 05/30/2015 09:51   Dg Chest Port 1 View  06/16/2015  CLINICAL DATA:  Fever, altered mental status.  Initial encounter. EXAM: PORTABLE CHEST 1 VIEW COMPARISON:  None. FINDINGS: The lungs are clear. Heart size is normal. No pneumothorax or pleural effusion. Calcified breast implant on the right is noted. IMPRESSION: No acute disease. Electronically Signed   By: Inge Rise M.D.   On: 06/16/2015 18:57    Microbiology: Recent Results (from the past 240 hour(s))  Blood culture (routine x 2)     Status: None (Preliminary result)   Collection Time: 06/16/15  4:29 PM  Result Value Ref Range Status   Specimen Description BLOOD RIGHT ANTECUBITAL  Final   Special Requests BOTTLES DRAWN AEROBIC AND ANAEROBIC 5 CC EACH  Final   Culture   Final    NO GROWTH 3 DAYS Performed at [facility]    Report Status PENDING  Incomplete  Blood culture (routine x 2)     Status: None (Preliminary result)   Collection Time: 06/16/15  4:30 PM  Result Value Ref Range Status   Specimen Description BLOOD LEFT HAND  Final   Special Requests BOTTLES DRAWN AEROBIC ONLY 5CC  Final   Culture   Final    NO GROWTH 3 DAYS Performed at [facility]    Report Status PENDING  Incomplete   Urine culture     Status: None   Collection Time: 06/16/15  6:07 PM  Result Value Ref Range Status   Specimen Description URINE, CLEAN CATCH  Final   Special Requests NONE  Final   Culture   Final    >=100,000 COLONIES/mL ESCHERICHIA COLI Performed at [facility]    Report Status 06/19/2015 FINAL  Final   Organism ID, Bacteria ESCHERICHIA COLI  Final      Susceptibility   Escherichia coli - MIC*    AMPICILLIN 4 SENSITIVE Sensitive     CEFAZOLIN <=4 SENSITIVE Sensitive     CEFTRIAXONE <=1 SENSITIVE Sensitive     CIPROFLOXACIN <=0.25 SENSITIVE Sensitive     GENTAMICIN <=1 SENSITIVE Sensitive     IMIPENEM <=0.25 SENSITIVE Sensitive     NITROFURANTOIN <=16 SENSITIVE Sensitive     TRIMETH/SULFA <=20 SENSITIVE Sensitive     AMPICILLIN/SULBACTAM <=2 SENSITIVE Sensitive     PIP/TAZO <=4 SENSITIVE Sensitive     * >=100,000 COLONIES/mL ESCHERICHIA COLI     Labs: Basic Metabolic Panel:  Recent Labs Lab 06/16/15 1629 06/17/15 0404 06/18/15 0359 06/19/15 0414  NA 135 139 138 140  K 4.3 4.0 3.7 4.0  CL 102 111 110 107  CO2 24 23 23 26   GLUCOSE 121* 127* 116* 109*  BUN 32* 26* 20 12  CREATININE 1.50* 1.18* 1.06* 1.00  CALCIUM 8.5* 7.8* 8.0* 8.1*  MG  --  1.9  --   --   PHOS  --  1.9*  --   --    Liver Function Tests:  Recent Labs Lab 06/16/15 1629 06/17/15 0404  AST 28 20  ALT 23 18  ALKPHOS 67 50  BILITOT 1.0 0.5  PROT 6.4* 5.2*  ALBUMIN 3.2* 2.4*   No results for input(s): LIPASE, AMYLASE in the last 168 hours. No results for input(s): AMMONIA in the last 168 hours. CBC:  Recent Labs Lab 06/16/15 1629 06/17/15 0404 06/18/15 0359  WBC 6.7 3.7* 2.9*  NEUTROABS 5.6 2.8  --   HGB 13.6 10.8* 11.4*  HCT 40.8 32.8* 35.2*  MCV 93.8 95.6 95.9  PLT 151 113* 127*   Cardiac Enzymes: No results for input(s): CKTOTAL, CKMB, CKMBINDEX, TROPONINI in the last 168 hours.  BNP: BNP (last 3 results)  Recent Labs  06/18/15 1451 06/19/15 0414  BNP 233.7*  187.8*    ProBNP (last 3 results) No results for input(s): PROBNP in the last 8760 hours.  CBG: No results for input(s): GLUCAP in the last 168 hours.     SignedDebbe Odea, MD [facility] 06/19/2015, 1:10 PM

## 2015-06-20 NOTE — Progress Notes (Signed)
Patient ID: AHSLEE [name], female   DOB: [birth year], 65 y.o.   MRN: [id number]     Cardiology Office Note   Date:  06/21/2015   ID:  [name], DOB Apr 24, 1951, MRN [id number]  PCP:  Alesia Richards, MD  Cardiologist:   Jenkins Rouge, MD   Chief Complaint  Patient presents with  . Hypertension    some nausea      History of Present Illness: [name] is a 65 y.o. female who presents for surgical clearance.  Seen in March 2015 for LBBB.  History of HTN.  Reviewed echo from 08/14/13 and EF normal 55-60 Mild MR.  Had renal failure with ACE in past  Also reviewed renal artery duplex same time and no RAS.  Just d/c from hospital 4/22/1 for pylonephritis / and sepsis .  Grew Ecoli.  Had hypotension and confusion.  Beta blocker for BP held.  D/c with cipro.  ? Of diastolic CHF with max BNP only 233 and clinically was hydrated for sepsis and hypotension. Also noted suppressed TSH at .121    She is still on antibiotics.  BP still low  Has a left inguinal hernia and saw Dr Redmond Pulling a month ago  ? Surgery.  She had SSCP radiating to jaw then and was told she needed preop clearence     Past Medical History  Diagnosis Date  . OCD (obsessive compulsive disorder)   . IBS (irritable bowel syndrome)   . Diverticulosis   . Helicobacter pylori (H. pylori)   . History of IBS   . Prediabetes   . Migraines   . Vitamin D deficiency     Past Surgical History  Procedure Laterality Date  . Tonsillectomy    . Oophorectomy    . Bunionectomy      Bil  . Colon surgery      colonoscopy  . Hemorrhoid surgery    . Breast enhancement surgery       Current Outpatient Prescriptions  Medication Sig Dispense Refill  . acetaminophen (TYLENOL) 500 MG tablet Take 500 mg by mouth every 6 (six) hours as needed for pain.    Marland Kitchen ALPRAZolam (XANAX) 0.5 MG tablet Take 1/2 to 1 tablet 3 x day if needed for anxiety or sleep 90 tablet 2  . aspirin 81 MG chewable tablet Chew 81 mg by mouth daily.        . B Complex-C (SUPER B COMPLEX PO) Take 1 tablet by mouth daily.     . calcium carbonate (OS-CAL) 600 MG TABS Take 600 mg by mouth daily.    . calcium carbonate (TUMS - DOSED IN MG ELEMENTAL CALCIUM) 500 MG chewable tablet Chew 1 tablet by mouth as needed for indigestion. Uses Rolaids    . Cholecalciferol (VITAMIN D-3) 5000 UNITS TABS Take 5,000 Units by mouth daily.     . ciprofloxacin (CIPRO) 500 MG tablet Take 1 tablet (500 mg total) by mouth 2 (two) times daily. 10 tablet 0  . famotidine (PEPCID) 20 MG tablet Take 20 mg by mouth daily.    . FORFIVO XL 450 MG TB24 Take 450 mg by mouth daily.  1  . hyoscyamine (LEVSIN SL) 0.125 MG SL tablet Place 2 tablets (0.25 mg total) under the tongue every 4 (four) hours as needed. Stomach pain and bladder spasms 100 tablet 2  . levothyroxine (SYNTHROID, LEVOTHROID) 125 MCG tablet TAKE 1 TABLET BY MOUTH ONCE DAILY 90 tablet 3  . Magnesium 250 MG TABS Take 250  mg by mouth daily.    . Melatonin 3 MG TABS Take 1 tablet by mouth daily as needed (sleep).     . Multiple Vitamins-Minerals (MULTIVITAMIN WITH MINERALS) tablet Take 1 tablet by mouth daily.    . ondansetron (ZOFRAN) 8 MG tablet TAKE 1 TABLET BY MOUTH 3 TIMES DAILY AS NEEDED FOR NAUSEA 24 tablet 3  . OSCIMIN SR 0.375 MG 12 hr tablet TAKE 1-2 TABLETS BY MOUTH TWICE DAILY 120 tablet PRN  . OVER THE COUNTER MEDICATION Black Kohosh 1 capsule by mouth daily.     No current facility-administered medications for this visit.    Allergies:   Ace inhibitors; Cephalexin; Macrobid; Codeine; and Sulfa antibiotics    Social History:  The patient  reports that she quit smoking about 13 years ago. Her smoking use included Cigarettes. She has a 40 pack-year smoking history. She has never used smokeless tobacco. She reports that she does not drink alcohol or use illicit drugs.   Family History:  The patient's family history includes Colon polyps in her mother. There is no history of Colon cancer.    ROS:   Please see the history of present illness.   Otherwise, review of systems are positive for none.   All other systems are reviewed and negative.    PHYSICAL EXAM: VS:  BP 80/60 mmHg  Pulse 104  Ht 5\' 4"  (1.626 m)  Wt 70.852 kg (156 lb 3.2 oz)  BMI 26.80 kg/m2  SpO2 98% , BMI Body mass index is 26.8 kg/(m^2). Affect appropriate Healthy:  appears stated age 71: normal Neck supple with no adenopathy JVP normal no bruits no thyromegaly Lungs clear with no wheezing and good diaphragmatic motion Heart:  S1/S2 no murmur, no rub, gallop or click PMI normal Abdomen: benighn, BS positve, no tenderness, no AAA no bruit.  No HSM or HJR Distal pulses intact with no bruits No edema Neuro non-focal Skin warm and dry No muscular weakness    EKG:  06/16/15  SR rate 87 Lbbb    Recent Labs: 06/17/2015: ALT 18; Magnesium 1.9; TSH 0.121* 06/18/2015: Hemoglobin 11.4*; Platelets 127* 06/19/2015: B Natriuretic Peptide 187.8*; BUN 12; Creatinine, Ser 1.00; Potassium 4.0; Sodium 140    Lipid Panel    Component Value Date/Time   CHOL 145 01/10/2015 1252   TRIG 48 01/10/2015 1252   HDL 56 01/10/2015 1252   CHOLHDL 2.6 01/10/2015 1252   VLDL 10 01/10/2015 1252   LDLCALC 79 01/10/2015 1252      Wt Readings from Last 3 Encounters:  06/21/15 70.852 kg (156 lb 3.2 oz)  06/19/15 69.446 kg (153 lb 1.6 oz)  04/23/15 73.483 kg (162 lb)      Other studies Reviewed: Additional studies/ records that were reviewed today include:  Epic notes recent hospital admission and my previous office notes.    ASSESSMENT AND PLAN:  1.  Preop:  Would not do elective hernia surgery until pylonephritis resolved and TSH normalized.  Given LBBB and ? Diastolic dysfunction with volume loading During sepsis will order lexiscan myovue and echo 2. Thyroid:  Needs f/u TSH T4 with primary 3. Pylonephritis:  Continue cipro.  F/u primary Monday  Needs repeat UA and ? CT to make sure infection cleared.   4. LBBB   Chronic no previous history of DCM  No AV block    Current medicines are reviewed at length with the patient today.  The patient does not have concerns regarding medicines.  The following changes have been  made:  no change  Labs/ tests ordered today include:  Echo and Lexiscan Myovue   No orders of the defined types were placed in this encounter.     Disposition:   FU with me next available      Signed, Jenkins Rouge, MD  06/21/2015 10:45 AM    [address], [place], [place]  [address] Phone: [phone]; Fax: [phone]

## 2015-06-21 ENCOUNTER — Encounter: Payer: Self-pay | Admitting: Cardiovascular Disease

## 2015-06-21 ENCOUNTER — Ambulatory Visit (INDEPENDENT_AMBULATORY_CARE_PROVIDER_SITE_OTHER): Payer: 59 | Admitting: Cardiovascular Disease

## 2015-06-21 VITALS — BP 80/60 | HR 104 | Ht 64.0 in | Wt 156.2 lb

## 2015-06-21 DIAGNOSIS — R079 Chest pain, unspecified: Secondary | ICD-10-CM

## 2015-06-21 DIAGNOSIS — R0600 Dyspnea, unspecified: Secondary | ICD-10-CM | POA: Diagnosis not present

## 2015-06-21 DIAGNOSIS — I10 Essential (primary) hypertension: Secondary | ICD-10-CM | POA: Diagnosis not present

## 2015-06-21 LAB — CULTURE, BLOOD (ROUTINE X 2)
Culture: NO GROWTH
Culture: NO GROWTH

## 2015-06-21 NOTE — Patient Instructions (Addendum)
Medication Instructions:  Your physician recommends that you continue on your current medications as directed. Please refer to the Current Medication list given to you today.  Labwork: NONE  Testing/Procedures: Your physician has requested that you have an echocardiogram. Echocardiography is a painless test that uses sound waves to create images of your heart. It provides your doctor with information about the size and shape of your heart and how well your heart's chambers and valves are working. This procedure takes approximately one hour. There are no restrictions for this procedure.  Your physician has requested that you have a lexiscan myoview. For further information please visit [email or web address]. Please follow instruction sheet, as given.  Follow-Up: Your physician wants you to follow-up next available with Dr. Johnsie Cancel.   If you need a refill on your cardiac medications before your next appointment, please call your pharmacy.

## 2015-06-23 NOTE — Progress Notes (Signed)
Subjective:    Patient ID: [name], female    DOB: 05/25/1950, 65 y.o.   MRN: [id number]  HPI  Very nice 65 yo DWF recently hospitalized Feb 19-22 with Urosepsis due to [facility] treated with Cipro and she was rehydrated for dehydration with rise in BUN 32 & Creat 1.50. Patient improved from her uroseptic state that hospitalized her , but now is also c/o increased sadness and became tearful during OV w/o any admitted aggravating factors.     She does report GERD sx's are not controlled with BID Pepcid and alleges dietary compliance.  Medication Sig  . acetaminophen  500 MG  Take  every 6 hrs as needed for pain.  Marland Kitchen ALPRAZolam  0.5 MG Take 1/2 to 1 tablet 3 x day if needed for anxiety or sleep  . aspirin 81 MG Chew 81 mgdaily.    . SUPER B COMPLEX  Take 1 tab daily.   . OS-CAL 600 MG  Take  daily.  . TUMS 500 MG  Chew 1 tab as needed for indigestion  . VITAMIN D 5000 UNITS  Take daily.   . ciprofloxacin (CIPRO) 500 MG tablet Take 1 tab 2  times daily.  . famotidine  20 MG  Take 20 mg by mouth daily.  . FORFIVO XL 450 MG  Take 450 mg by mouth daily.  . hyoscyamine  SL 0.125 MG SL Place 1-2 tab under the tongue every 4  hrs as needed  . levothyroxine 125 MCG  TAKE 1 TAB ONCE DAILY  . Magnesium 250 MG  Take  daily.  . Melatonin 3 MG TABS Take 1 tab daily as needed (sleep).   . Multiple Vitamins-Minerals Take 1 tab daily.  . ondansetron  8 MG tablet TAKE 1 TAB 3 TIMES DAILY AS NEEDED   . OSCIMIN SR 0.375 MG  TAKE 1-2 TAB TWICE DAILY  . Black Kohosh  1 capsule by mouth daily.    Allergies  Allergen Reactions  . Ace Inhibitors Other (See Comments)    Acute renal failure (lisinopril)  . Cephalexin Anaphylaxis  . Macrobid [Nitrofurantoin] Other (See Comments)    Causes hyperthermia   . Codeine Nausea Only  . Sulfa Antibiotics Rash   Past Medical History  Diagnosis Date  . OCD (obsessive compulsive disorder)   . IBS (irritable bowel syndrome)   . Diverticulosis   . Helicobacter  pylori (H. pylori)   . History of IBS   . Prediabetes   . Migraines   . Vitamin D deficiency    Review of Systems  10 point systems review negative except as above.    Objective:   Physical Exam  BP 134/90 mmHg  Pulse 88  Temp(Src) 97.7 F (36.5 C)  Resp 16  Ht 5\' 3"  (1.6 m)  Wt 155 lb (70.308 kg)  BMI 27.46 kg/m2  HEENT - Eac's patent. TM's Nl. EOM's full. PERRLA. NasoOroPharynx clear. Neck - supple. Nl Thyroid. Carotids 2+ & No bruits, nodes, JVD Chest - Clear equal BS w/o Rales, rhonchi, wheezes. Cor - Nl HS. RRR w/o sig MGR. PP 1(+). No edema. Abd - No palpable organomegaly, masses or tenderness. BS nl. MS- FROM w/o deformities. Muscle power, tone and bulk Nl. Gait Nl. Neuro - No obvious Cr N abnormalities. Sensory, motor and Cerebellar functions appear Nl w/o focal abnormalities. Psyche - Mental status normal & appropriate.    Assessment & Plan:   1. Essential hypertension   2. UTI (lower urinary tract infection)  -  Urinalysis, Routine w reflex microscopic  - Urine culture  3. ADD (attention deficit disorder)   4. Depression  - Continue buPROPion (WELLBUTRIN XL) 300 MG t; Take 1 tablet  daily.  - Add sertraline (ZOLOFT) 100 MG tablet; Start 1/4 tab x 8 days then 1/2 tab x 2 weks then may increase up to 1 whole tablet  - ROV 1 month   - Recc note for OOW from hospitalization from 2/19 - 06/18/2014 and recc stay out of work thru Mar 3rd  and then RTW Mar 4th   5. Gastroesophageal reflux disease, esophagitis presence not specified  - pantoprazole (PROTONIX) 40 MG tablet; Take 1 tablet daily for acid indigestion & reflux  Dispense: 90 tablet; Refill: 1  6. Medication management  - CBC with Differential/Platelet - BASIC METABOLIC PANEL WITH GFR

## 2015-06-24 ENCOUNTER — Ambulatory Visit (INDEPENDENT_AMBULATORY_CARE_PROVIDER_SITE_OTHER): Payer: 59 | Admitting: Internal Medicine

## 2015-06-24 ENCOUNTER — Encounter: Payer: Self-pay | Admitting: Internal Medicine

## 2015-06-24 VITALS — BP 134/90 | HR 88 | Temp 97.7°F | Resp 16 | Ht 63.0 in | Wt 155.0 lb

## 2015-06-24 DIAGNOSIS — K219 Gastro-esophageal reflux disease without esophagitis: Secondary | ICD-10-CM | POA: Diagnosis not present

## 2015-06-24 DIAGNOSIS — F909 Attention-deficit hyperactivity disorder, unspecified type: Secondary | ICD-10-CM

## 2015-06-24 DIAGNOSIS — F329 Major depressive disorder, single episode, unspecified: Secondary | ICD-10-CM | POA: Diagnosis not present

## 2015-06-24 DIAGNOSIS — F988 Other specified behavioral and emotional disorders with onset usually occurring in childhood and adolescence: Secondary | ICD-10-CM

## 2015-06-24 DIAGNOSIS — Z79899 Other long term (current) drug therapy: Secondary | ICD-10-CM | POA: Diagnosis not present

## 2015-06-24 DIAGNOSIS — F32A Depression, unspecified: Secondary | ICD-10-CM

## 2015-06-24 DIAGNOSIS — I10 Essential (primary) hypertension: Secondary | ICD-10-CM | POA: Diagnosis not present

## 2015-06-24 DIAGNOSIS — N390 Urinary tract infection, site not specified: Secondary | ICD-10-CM

## 2015-06-24 LAB — CBC WITH DIFFERENTIAL/PLATELET
BASOS ABS: 0.1 10*3/uL (ref 0.0–0.1)
Basophils Relative: 1 % (ref 0–1)
EOS ABS: 0.4 10*3/uL (ref 0.0–0.7)
Eosinophils Relative: 6 % — ABNORMAL HIGH (ref 0–5)
HCT: 41.6 % (ref 36.0–46.0)
Hemoglobin: 13.7 g/dL (ref 12.0–15.0)
LYMPHS ABS: 2.1 10*3/uL (ref 0.7–4.0)
LYMPHS PCT: 30 % (ref 12–46)
MCH: 30.6 pg (ref 26.0–34.0)
MCHC: 32.9 g/dL (ref 30.0–36.0)
MCV: 93.1 fL (ref 78.0–100.0)
MPV: 9.6 fL (ref 8.6–12.4)
Monocytes Absolute: 0.6 10*3/uL (ref 0.1–1.0)
Monocytes Relative: 8 % (ref 3–12)
NEUTROS PCT: 55 % (ref 43–77)
Neutro Abs: 3.9 10*3/uL (ref 1.7–7.7)
PLATELETS: 490 10*3/uL — AB (ref 150–400)
RBC: 4.47 MIL/uL (ref 3.87–5.11)
RDW: 14.5 % (ref 11.5–15.5)
WBC: 7 10*3/uL (ref 4.0–10.5)

## 2015-06-24 LAB — BASIC METABOLIC PANEL WITH GFR
BUN: 23 mg/dL (ref 7–25)
CHLORIDE: 98 mmol/L (ref 98–110)
CO2: 28 mmol/L (ref 20–31)
Calcium: 10.3 mg/dL (ref 8.6–10.4)
Creat: 1.22 mg/dL — ABNORMAL HIGH (ref 0.50–0.99)
GFR, EST AFRICAN AMERICAN: 54 mL/min — AB (ref >=60)
GFR, EST NON AFRICAN AMERICAN: 47 mL/min — AB (ref >=60)
Glucose, Bld: 98 mg/dL (ref 65–99)
POTASSIUM: 5 mmol/L (ref 3.5–5.3)
SODIUM: 135 mmol/L (ref 135–146)

## 2015-06-24 MED ORDER — BUPROPION HCL ER (XL) 300 MG PO TB24: 300.0000 mg | ORAL_TABLET | Freq: Every day | ORAL | Status: DC

## 2015-06-24 MED ORDER — PANTOPRAZOLE SODIUM 40 MG PO TBEC: DELAYED_RELEASE_TABLET | ORAL | Status: DC

## 2015-06-24 MED ORDER — SERTRALINE HCL 100 MG PO TABS: ORAL_TABLET | ORAL | Status: DC

## 2015-06-24 MED ORDER — METHYLPHENIDATE HCL 20 MG PO TABS: ORAL_TABLET | ORAL | Status: DC

## 2015-06-24 MED FILL — SERTRALINE HCL 100 MG TAB: 100 | 30 days supply | Qty: 30 | Fill #0

## 2015-06-24 NOTE — Patient Instructions (Signed)

## 2015-06-25 LAB — URINALYSIS, ROUTINE W REFLEX MICROSCOPIC
Bilirubin Urine: NEGATIVE
Glucose, UA: NEGATIVE
Hgb urine dipstick: NEGATIVE
Ketones, ur: NEGATIVE
LEUKOCYTES UA: NEGATIVE
NITRITE: NEGATIVE
PROTEIN: NEGATIVE
SPECIFIC GRAVITY, URINE: 1.011 (ref 1.001–1.035)
pH: 7.5 (ref 5.0–8.0)

## 2015-06-25 LAB — URINE CULTURE
Colony Count: NO GROWTH
Organism ID, Bacteria: NO GROWTH

## 2015-06-25 MED FILL — PANTOPRAZOLE SOD DR 40 MG T: 40 | 90 days supply | Qty: 90 | Fill #0 | Status: TO

## 2015-06-26 ENCOUNTER — Other Ambulatory Visit: Payer: Self-pay | Admitting: Internal Medicine

## 2015-06-26 ENCOUNTER — Telehealth (HOSPITAL_COMMUNITY): Payer: Self-pay

## 2015-06-26 NOTE — Telephone Encounter (Signed)
Encounter complete. 

## 2015-06-27 ENCOUNTER — Encounter: Payer: Self-pay | Admitting: Internal Medicine

## 2015-06-27 ENCOUNTER — Ambulatory Visit (INDEPENDENT_AMBULATORY_CARE_PROVIDER_SITE_OTHER): Payer: 59 | Admitting: Internal Medicine

## 2015-06-27 VITALS — BP 124/70 | HR 72 | Temp 97.8°F | Resp 18 | Ht 63.0 in | Wt 153.0 lb

## 2015-06-27 DIAGNOSIS — N390 Urinary tract infection, site not specified: Secondary | ICD-10-CM

## 2015-06-27 DIAGNOSIS — R531 Weakness: Secondary | ICD-10-CM

## 2015-06-27 MED ORDER — MECLIZINE HCL 25 MG PO TABS: 25.0000 mg | ORAL_TABLET | Freq: Three times a day (TID) | ORAL | Status: DC | PRN

## 2015-06-27 MED FILL — MECLIZINE 25 MG TABLET: 25 | 10 days supply | Qty: 30 | Fill #0

## 2015-06-27 NOTE — Patient Instructions (Signed)
Katrina will call you about the physical therapy in the next couple days.  Please take meclizine up to 3 times daily as needed for nausea and dizziness.  We will call you tomorrow when your FMLA papers are completed.  Please rest and hydrate well.

## 2015-06-27 NOTE — Progress Notes (Signed)
Assessment and Plan:   1. Urinary tract infection without hematuria, site unspecified -FMLA paper -PT -continued rest and fluids -recheck 17th of march -return to work 07/17/15  HPI 65 y.o.female presents for 1 month follow up of hospitalization secondary to her urosepsis.  She reports that she is doing slightly better.  She reports that she is still having some nausea and she is still having extreme fatigue since being discharged. Patient reports that they have been doing well.  female is taking their medication.  They are not having difficulty with their medications.  They report no adverse reactions.  She feels like she is not doing well enough to go back to work.  She reports that she gets so tired she just has to sit.  She was disharged on 06/19/15.  She has chronic nausea which has been problematic recently.  She cannot get in to see her GI doctor until the end of April.     Past Medical History  Diagnosis Date  . OCD (obsessive compulsive disorder)   . IBS (irritable bowel syndrome)   . Diverticulosis   . Helicobacter pylori (H. pylori)   . History of IBS   . Prediabetes   . Migraines   . Vitamin D deficiency      Allergies  Allergen Reactions  . Ace Inhibitors Other (See Comments)    Acute renal failure (lisinopril)  . Cephalexin Anaphylaxis  . Macrobid [Nitrofurantoin] Other (See Comments)    Causes hyperthermia   . Codeine Nausea Only  . Sulfa Antibiotics Rash      Current Outpatient Prescriptions on File Prior to Visit  Medication Sig Dispense Refill  . acetaminophen (TYLENOL) 500 MG tablet Take 500 mg by mouth every 6 (six) hours as needed for pain.    Marland Kitchen ALPRAZolam (XANAX) 0.5 MG tablet Take 1/2 to 1 tablet 3 x day if needed for anxiety or sleep 90 tablet 2  . aspirin 81 MG chewable tablet Chew 81 mg by mouth daily.      Marland Kitchen atenolol (TENORMIN) 100 MG tablet Take 100 mg by mouth daily.    . B Complex-C (SUPER B COMPLEX PO) Take 1 tablet by mouth daily.     Marland Kitchen  buPROPion (WELLBUTRIN XL) 300 MG 24 hr tablet Take 1 tablet (300 mg total) by mouth daily. 90 tablet 0  . calcium carbonate (OS-CAL) 600 MG TABS Take 600 mg by mouth daily.    . calcium carbonate (TUMS - DOSED IN MG ELEMENTAL CALCIUM) 500 MG chewable tablet Chew 1 tablet by mouth as needed for indigestion. Uses Rolaids    . Cholecalciferol (VITAMIN D-3) 5000 UNITS TABS Take 5,000 Units by mouth daily.     . hyoscyamine (LEVSIN SL) 0.125 MG SL tablet Place 2 tablets (0.25 mg total) under the tongue every 4 (four) hours as needed. Stomach pain and bladder spasms 100 tablet 2  . levothyroxine (SYNTHROID, LEVOTHROID) 125 MCG tablet TAKE 1 TABLET BY MOUTH ONCE DAILY 90 tablet 3  . Magnesium 250 MG TABS Take 250 mg by mouth daily.    . Melatonin 3 MG TABS Take 1 tablet by mouth daily as needed (sleep).     . Multiple Vitamins-Minerals (MULTIVITAMIN WITH MINERALS) tablet Take 1 tablet by mouth daily.    . ondansetron (ZOFRAN) 8 MG tablet TAKE 1 TABLET BY MOUTH 3 TIMES DAILY AS NEEDED FOR NAUSEA 24 tablet 3  . OSCIMIN SR 0.375 MG 12 hr tablet TAKE 1-2 TABLETS BY MOUTH TWICE DAILY 120 tablet  PRN  . OVER THE COUNTER MEDICATION Black Kohosh 1 capsule by mouth daily.    . pantoprazole (PROTONIX) 40 MG tablet Take 1 tablet daily for acid indigestion & reflux 90 tablet 1  . sertraline (ZOLOFT) 100 MG tablet Take 1/2 to 1 tablet daily as directed for Mood, Anxiety & Depression 30 tablet 3  . methylphenidate (RITALIN) 20 MG tablet 1/2 to 1 tab 3 x daily as needed for ADD or alertness (Patient not taking: Reported on 06/27/2015) 90 tablet 0   No current facility-administered medications on file prior to visit.    ROS: all negative except above.   Physical Exam: Filed Weights   06/27/15 1415  Weight: 153 lb (69.4 kg)   BP 124/70 mmHg  Pulse 72  Temp(Src) 97.8 F (36.6 C) (Temporal)  Resp 18  Ht 5\' 3"  (1.6 m)  Wt 153 lb (69.4 kg)  BMI 27.11 kg/m2 General Appearance: Well developed well nourished,  non-toxic appearing in no apparent distress. Eyes: PERRLA, EOMs, conjunctiva w/ no swelling or erythema or discharge Sinuses: No Frontal/maxillary tenderness ENT/Mouth: Ear canals clear without swelling or erythema.  TM's normal bilaterally with no retractions, bulging, or loss of landmarks.   Neck: Supple, thyroid normal, no notable JVD  Respiratory: Respiratory effort normal, Clear breath sounds anteriorly and posteriorly bilaterally without rales, rhonchi, wheezing or stridor. No retractions or accessory muscle usage. Cardio: RRR with no MRGs.   Abdomen: Soft, + BS.  Non tender, no guarding, rebound, hernias, masses.  Musculoskeletal: Full ROM, 5/5 strength, normal gait.  Skin: Warm, dry without rashes  Neuro: Awake and oriented X 3, Cranial nerves intact. Normal muscle tone, no cerebellar symptoms. Sensation intact.  Psych: normal affect, Insight and Judgment appropriate.     Starlyn Skeans, PA-C 2:55 PM [facility]

## 2015-06-28 ENCOUNTER — Ambulatory Visit (HOSPITAL_BASED_OUTPATIENT_CLINIC_OR_DEPARTMENT_OTHER)
Admission: RE | Admit: 2015-06-28 | Discharge: 2015-06-28 | Disposition: A | Discharge status: Home / Self Care | Payer: 59 | Source: Ambulatory Visit | Attending: Cardiovascular Disease | Admitting: Cardiovascular Disease

## 2015-06-28 ENCOUNTER — Ambulatory Visit (HOSPITAL_COMMUNITY)
Admission: RE | Admit: 2015-06-28 | Discharge: 2015-06-28 | Disposition: A | Discharge status: Home / Self Care | Payer: 59 | Source: Ambulatory Visit | Attending: Internal Medicine | Admitting: Internal Medicine

## 2015-06-28 DIAGNOSIS — R5383 Other fatigue: Secondary | ICD-10-CM | POA: Insufficient documentation

## 2015-06-28 DIAGNOSIS — I351 Nonrheumatic aortic (valve) insufficiency: Secondary | ICD-10-CM | POA: Insufficient documentation

## 2015-06-28 DIAGNOSIS — I340 Nonrheumatic mitral (valve) insufficiency: Secondary | ICD-10-CM | POA: Insufficient documentation

## 2015-06-28 DIAGNOSIS — R0600 Dyspnea, unspecified: Secondary | ICD-10-CM | POA: Insufficient documentation

## 2015-06-28 DIAGNOSIS — R0602 Shortness of breath: Secondary | ICD-10-CM | POA: Insufficient documentation

## 2015-06-28 DIAGNOSIS — I071 Rheumatic tricuspid insufficiency: Secondary | ICD-10-CM | POA: Insufficient documentation

## 2015-06-28 DIAGNOSIS — I119 Hypertensive heart disease without heart failure: Secondary | ICD-10-CM | POA: Insufficient documentation

## 2015-06-28 DIAGNOSIS — I447 Left bundle-branch block, unspecified: Secondary | ICD-10-CM | POA: Diagnosis not present

## 2015-06-28 DIAGNOSIS — R42 Dizziness and giddiness: Secondary | ICD-10-CM | POA: Diagnosis not present

## 2015-06-28 DIAGNOSIS — R079 Chest pain, unspecified: Secondary | ICD-10-CM | POA: Insufficient documentation

## 2015-06-28 DIAGNOSIS — R0609 Other forms of dyspnea: Secondary | ICD-10-CM | POA: Insufficient documentation

## 2015-06-28 DIAGNOSIS — Z87891 Personal history of nicotine dependence: Secondary | ICD-10-CM | POA: Insufficient documentation

## 2015-06-28 LAB — MYOCARDIAL PERFUSION IMAGING
CHL CUP NUCLEAR SDS: 0
CHL CUP NUCLEAR SRS: 3
CHL CUP RESTING HR STRESS: 57 {beats}/min
CHL CUP STRESS STAGE 1 DBP: 84 mmHg
CHL CUP STRESS STAGE 3 GRADE: 0 %
CHL CUP STRESS STAGE 4 GRADE: 0 %
CHL CUP STRESS STAGE 4 SPEED: 0 mph
CSEPEW: 1 METS
CSEPPHR: 75 {beats}/min
CSEPPMHR: 47 %
LV dias vol: 11 mL
LV sys vol: 44 mL
Peak BP: 126 mmHg
SSS: 3
Stage 1 Grade: 0 %
Stage 1 HR: 58 {beats}/min
Stage 1 SBP: 124 mmHg
Stage 1 Speed: 0 mph
Stage 2 Grade: 0 %
Stage 2 HR: 58 {beats}/min
Stage 2 Speed: 0 mph
Stage 3 DBP: 96 mmHg
Stage 3 HR: 75 {beats}/min
Stage 3 SBP: 126 mmHg
Stage 3 Speed: 0 mph
Stage 4 DBP: 82 mmHg
Stage 4 HR: 67 {beats}/min
Stage 4 SBP: 110 mmHg
TID: 1.08

## 2015-06-28 MED ORDER — AMINOPHYLLINE 25 MG/ML IV SOLN
75.0000 mg | Freq: Once | INTRAVENOUS | Status: AC
  Administered 2015-06-28: 75 mg via INTRAVENOUS

## 2015-06-28 MED ORDER — REGADENOSON 0.4 MG/5ML IV SOLN
0.4000 mg | Freq: Once | INTRAVENOUS | Status: AC
  Administered 2015-06-28: 0.4 mg via INTRAVENOUS

## 2015-06-28 MED ORDER — TECHNETIUM TC 99M SESTAMIBI GENERIC - CARDIOLITE
10.8000 | Freq: Once | INTRAVENOUS | Status: AC | PRN
  Administered 2015-06-28: 11 via INTRAVENOUS

## 2015-06-28 MED ORDER — TECHNETIUM TC 99M SESTAMIBI GENERIC - CARDIOLITE
31.0000 | Freq: Once | INTRAVENOUS | Status: AC | PRN
  Administered 2015-06-28: 31 via INTRAVENOUS

## 2015-07-02 ENCOUNTER — Encounter: Payer: Self-pay | Admitting: Internal Medicine

## 2015-07-05 DIAGNOSIS — R531 Weakness: Secondary | ICD-10-CM | POA: Diagnosis not present

## 2015-07-08 ENCOUNTER — Other Ambulatory Visit (HOSPITAL_COMMUNITY): Payer: 59

## 2015-07-08 ENCOUNTER — Encounter (HOSPITAL_COMMUNITY): Payer: 59

## 2015-07-08 ENCOUNTER — Ambulatory Visit: Payer: 59 | Admitting: Physician Assistant

## 2015-07-09 ENCOUNTER — Encounter: Payer: 59 | Admitting: Cardiovascular Disease

## 2015-07-12 ENCOUNTER — Ambulatory Visit (INDEPENDENT_AMBULATORY_CARE_PROVIDER_SITE_OTHER): Payer: 59 | Admitting: Internal Medicine

## 2015-07-12 ENCOUNTER — Encounter: Payer: Self-pay | Admitting: Internal Medicine

## 2015-07-12 VITALS — BP 124/66 | HR 62 | Temp 98.0°F | Resp 16 | Ht 63.0 in | Wt 149.0 lb

## 2015-07-12 DIAGNOSIS — E039 Hypothyroidism, unspecified: Secondary | ICD-10-CM | POA: Diagnosis not present

## 2015-07-12 DIAGNOSIS — N390 Urinary tract infection, site not specified: Secondary | ICD-10-CM

## 2015-07-12 LAB — COMPREHENSIVE METABOLIC PANEL
ALK PHOS: 55 U/L (ref 33–130)
ALT: 15 U/L (ref 6–29)
AST: 17 U/L (ref 10–35)
Albumin: 3.6 g/dL (ref 3.6–5.1)
BUN: 18 mg/dL (ref 7–25)
CALCIUM: 9 mg/dL (ref 8.6–10.4)
CHLORIDE: 101 mmol/L (ref 98–110)
CO2: 28 mmol/L (ref 20–31)
Creat: 1.29 mg/dL — ABNORMAL HIGH (ref 0.50–0.99)
GLUCOSE: 86 mg/dL (ref 65–99)
POTASSIUM: 3.8 mmol/L (ref 3.5–5.3)
Sodium: 137 mmol/L (ref 135–146)
Total Bilirubin: 0.7 mg/dL (ref 0.2–1.2)
Total Protein: 6.2 g/dL (ref 6.1–8.1)

## 2015-07-12 LAB — TSH: TSH: 0.39 m[IU]/L — AB

## 2015-07-12 NOTE — Progress Notes (Signed)
Assessment and Plan:   1. UTI (lower urinary tract infection)  - Comprehensive metabolic panel  2. Hypothyroidism, unspecified hypothyroidism type  - TSH  Patient fully cleared to return to full duty at work.  Her general surgeon would like to have CMET and TSH to be able to clear her for surgery for Nissen Fundoplication surgery for hiatal hernia.     HPI 65 y.o.female presents for 1 month follow up of UTI with weakness after discharge. Patient reports that they have been doing .  female is taking their medication.  They are not having difficulty with their medications.  They report no adverse reactions.  She is doing physical therapy and is going to do one more session and be done with it.  She reports that she is still having some minor back pain.  She does feel that depression has improved with increased doses of zoloft and also wellbutrin.  Past Medical History  Diagnosis Date  . OCD (obsessive compulsive disorder)   . IBS (irritable bowel syndrome)   . Diverticulosis   . Helicobacter pylori (H. pylori)   . History of IBS   . Prediabetes   . Migraines   . Vitamin D deficiency      Allergies  Allergen Reactions  . Ace Inhibitors Other (See Comments)    Acute renal failure (lisinopril)  . Cephalexin Anaphylaxis  . Macrobid [Nitrofurantoin] Other (See Comments)    Causes hyperthermia   . Codeine Nausea Only  . Sulfa Antibiotics Rash      Current Outpatient Prescriptions on File Prior to Visit  Medication Sig Dispense Refill  . acetaminophen (TYLENOL) 500 MG tablet Take 500 mg by mouth every 6 (six) hours as needed for pain.    Marland Kitchen ALPRAZolam (XANAX) 0.5 MG tablet Take 1/2 to 1 tablet 3 x day if needed for anxiety or sleep 90 tablet 2  . aspirin 81 MG chewable tablet Chew 81 mg by mouth daily.      Marland Kitchen atenolol (TENORMIN) 100 MG tablet Take 100 mg by mouth daily.    . B Complex-C (SUPER B COMPLEX PO) Take 1 tablet by mouth daily.     Marland Kitchen buPROPion (WELLBUTRIN XL) 300 MG  24 hr tablet Take 1 tablet (300 mg total) by mouth daily. 90 tablet 0  . calcium carbonate (OS-CAL) 600 MG TABS Take 600 mg by mouth daily.    . calcium carbonate (TUMS - DOSED IN MG ELEMENTAL CALCIUM) 500 MG chewable tablet Chew 1 tablet by mouth as needed for indigestion. Uses Rolaids    . Cholecalciferol (VITAMIN D-3) 5000 UNITS TABS Take 5,000 Units by mouth daily.     . hyoscyamine (LEVSIN SL) 0.125 MG SL tablet Place 2 tablets (0.25 mg total) under the tongue every 4 (four) hours as needed. Stomach pain and bladder spasms 100 tablet 2  . levothyroxine (SYNTHROID, LEVOTHROID) 125 MCG tablet TAKE 1 TABLET BY MOUTH ONCE DAILY 90 tablet 3  . Magnesium 250 MG TABS Take 250 mg by mouth daily.    . meclizine (ANTIVERT) 25 MG tablet Take 1 tablet (25 mg total) by mouth 3 (three) times daily as needed for nausea. 30 tablet 1  . Melatonin 3 MG TABS Take 1 tablet by mouth daily as needed (sleep).     . methylphenidate (RITALIN) 20 MG tablet 1/2 to 1 tab 3 x daily as needed for ADD or alertness 90 tablet 0  . Multiple Vitamins-Minerals (MULTIVITAMIN WITH MINERALS) tablet Take 1 tablet by mouth daily.    Marland Kitchen  ondansetron (ZOFRAN) 8 MG tablet TAKE 1 TABLET BY MOUTH 3 TIMES DAILY AS NEEDED FOR NAUSEA 24 tablet 3  . OSCIMIN SR 0.375 MG 12 hr tablet TAKE 1-2 TABLETS BY MOUTH TWICE DAILY 120 tablet PRN  . OVER THE COUNTER MEDICATION Black Kohosh 1 capsule by mouth daily.    . pantoprazole (PROTONIX) 40 MG tablet Take 1 tablet daily for acid indigestion & reflux 90 tablet 1  . sertraline (ZOLOFT) 100 MG tablet Take 1/2 to 1 tablet daily as directed for Mood, Anxiety & Depression 30 tablet 3   No current facility-administered medications on file prior to visit.    ROS: all negative except above.   Physical Exam: Filed Weights   07/12/15 1027  Weight: 149 lb (67.586 kg)   BP 124/66 mmHg  Pulse 62  Temp(Src) 98 F (36.7 C) (Temporal)  Resp 16  Ht 5\' 3"  (1.6 m)  Wt 149 lb (67.586 kg)  BMI 26.40  kg/m2 General Appearance: Well developed well nourished, non-toxic appearing in no apparent distress. Eyes: PERRLA, EOMs, conjunctiva w/ no swelling or erythema or discharge Sinuses: No Frontal/maxillary tenderness ENT/Mouth: Ear canals clear without swelling or erythema.  TM's normal bilaterally with no retractions, bulging, or loss of landmarks.   Neck: Supple, thyroid normal, no notable JVD  Respiratory: Respiratory effort normal, Clear breath sounds anteriorly and posteriorly bilaterally without rales, rhonchi, wheezing or stridor. No retractions or accessory muscle usage. Cardio: RRR with no MRGs.   Abdomen: Soft, + BS.  Non tender, no guarding, rebound, hernias, masses.  Musculoskeletal: Full ROM, 5/5 strength, normal gait.  Skin: Warm, dry without rashes  Neuro: Awake and oriented X 3, Cranial nerves intact. Normal muscle tone, no cerebellar symptoms. Sensation intact.  Psych: normal affect, Insight and Judgment appropriate.     Starlyn Skeans, PA-C 10:41 AM [facility]

## 2015-07-15 DIAGNOSIS — R531 Weakness: Secondary | ICD-10-CM | POA: Diagnosis not present

## 2015-07-16 ENCOUNTER — Ambulatory Visit: Payer: Self-pay | Admitting: Internal Medicine

## 2015-07-22 ENCOUNTER — Other Ambulatory Visit: Payer: Self-pay | Admitting: Internal Medicine

## 2015-07-22 MED FILL — ONDANSETRON HCL 8 MG TABLET: 8 | 8 days supply | Qty: 24 | Fill #1

## 2015-07-22 MED FILL — SERTRALINE HCL 100 MG TAB: 100 | 30 days supply | Qty: 30 | Fill #1

## 2015-07-22 MED FILL — FORFIVO XL 450 MG TABLET: 450 | 90 days supply | Qty: 90 | Fill #1

## 2015-07-22 MED FILL — ATENOLOL 100 MG TABLET: 100 | 90 days supply | Qty: 90 | Fill #0

## 2015-07-22 MED FILL — MECLIZINE 25 MG TABLET: 25 | 10 days supply | Qty: 30 | Fill #1

## 2015-07-25 DIAGNOSIS — R198 Other specified symptoms and signs involving the digestive system and abdomen: Secondary | ICD-10-CM | POA: Diagnosis not present

## 2015-07-30 ENCOUNTER — Other Ambulatory Visit: Payer: Self-pay | Admitting: Internal Medicine

## 2015-07-30 DIAGNOSIS — F988 Other specified behavioral and emotional disorders with onset usually occurring in childhood and adolescence: Secondary | ICD-10-CM

## 2015-07-30 MED ORDER — METHYLPHENIDATE HCL 20 MG PO TABS: ORAL_TABLET | ORAL | Status: DC

## 2015-08-01 ENCOUNTER — Ambulatory Visit: Payer: Self-pay | Admitting: Internal Medicine

## 2015-08-07 ENCOUNTER — Ambulatory Visit (INDEPENDENT_AMBULATORY_CARE_PROVIDER_SITE_OTHER): Payer: 59 | Admitting: Gastroenterology

## 2015-08-07 ENCOUNTER — Encounter: Payer: Self-pay | Admitting: Gastroenterology

## 2015-08-07 VITALS — BP 110/74 | HR 80 | Ht 63.0 in | Wt 150.8 lb

## 2015-08-07 DIAGNOSIS — K589 Irritable bowel syndrome without diarrhea: Secondary | ICD-10-CM | POA: Diagnosis not present

## 2015-08-07 DIAGNOSIS — K219 Gastro-esophageal reflux disease without esophagitis: Secondary | ICD-10-CM | POA: Diagnosis not present

## 2015-08-07 DIAGNOSIS — R159 Full incontinence of feces: Secondary | ICD-10-CM

## 2015-08-07 NOTE — Patient Instructions (Signed)
You have been scheduled to have an anorectal manometry at [facility] on 08/19/15 at 10:30am. Please arrive 30 minutes prior to your appointment time for registration (1st floor of the hospital-admissions).  Please make certain to use 1 Fleets enema 2 hours prior to coming for your appointment. You can purchase Fleets enemas from the laxative section at your drug store. You should not eat anything during the two hours prior to the procedure. You may take regular medications with small sips of water at least 2 hours prior to the study.  Anorectal manometry is a test performed to evaluate patients with constipation or fecal incontinence. This test measures the pressures of the anal sphincter muscles, the sensation in the rectum, and the neural reflexes that are needed for normal bowel movements.  THE PROCEDURE The test takes approximately 30 minutes to 1 hour. You will be asked to change into a hospital gown. A technician or nurse will explain the procedure to you, take a brief health history, and answer any questions you may have. The patient then lies on his or her left side. A small, flexible tube, about the size of a thermometer, with a balloon at the end is inserted into the rectum. The catheter is connected to a machine that measures the pressure. During the test, the small balloon attached to the catheter may be inflated in the rectum to assess the normal reflex pathways. The nurse or technician may also ask the person to squeeze, relax, and push at various times. The anal sphincter muscle pressures are measured during each of these maneuvers. To squeeze, the patient tightens the sphincter muscles as if trying to prevent anything from coming out. To push or bear down, the patient strains down as if trying to have a bowel movement.

## 2015-08-07 NOTE — Progress Notes (Signed)
    History of Present Illness: This is a 65 year old female self referred for the evaluation of IBS, GERD, fecal incontinence and LLQ bulge. She has had long-standing irritable bowel syndrome with alternating diarrhea and constipation. The past several months she has noted incontinence of diarrhea and mucus. She is also related worsening problems with nausea. Pantoprazole 40 mg daily was recently started in her nausea has completely abated. She underwent colonoscopy in 10/2012 showing 2 hyperplastic polyps and mild sigmoid colon diverticulosis. States she has noted a left lower quadrant bulge when standing. This worsens when she is constipated. She states she was evaluated by Dr. Redmond Pulling at [facility] and a hernia was suspected but was not confirmed on CT scan ion 05/30/2015 and she is monitoring this for now.  Abd/pelvic CT 06/17/2015 IMPRESSION: 1. Right-sided perinephric stranding, with mildly asymmetric right-sided pelvicaliectasis. Would correlate clinically for any evidence of pyelonephritis. 2. Emphysematous change at the lung apices, with scattered bilateral peripheral blebs. Minimal bilateral scarring noted. 3. Scattered coronary artery calcifications seen. 4. Scattered calcification along the abdominal aorta and its branches. 5. Scattered diverticulosis along the sigmoid colon, without evidence of diverticulitis.  Review of Systems: Pertinent positive and negative review of systems were noted in the above HPI section. All other review of systems were otherwise negative.  Current Medications, Allergies, Past Medical History, Past Surgical History, Family History and Social History were reviewed in [employer] record.  Physical Exam: General: Well developed, well nourished, no acute distress Head: Normocephalic and atraumatic Eyes:  sclerae anicteric, EOMI Ears: Normal auditory acuity Mouth: No deformity or lesions Neck: Supple, no masses or  thyromegaly Lungs: Clear throughout to auscultation Heart: Regular rate and rhythm; no murmurs, rubs or bruits Abdomen: Soft, non tender and non distended. No masses, hepatosplenomegaly noted. LLQ bulge that feels like bowel noted while standing. Normal Bowel sounds Rectal: markedly decreased resting sphincter tone and squeeze, no lesions, heme neg stool Musculoskeletal: Symmetrical with no gross deformities  Skin: No lesions on visible extremities Pulses:  Normal pulses noted Extremities: No clubbing, cyanosis, edema or deformities noted Neurological: Alert oriented x 4, grossly nonfocal Cervical Nodes:  No significant cervical adenopathy Inguinal Nodes: No significant inguinal adenopathy Psychological:  Alert and cooperative. Normal mood and affect  Assessment and Recommendations:  1. GERD. Continue pantoprazole 40 mg daily and antireflux measures.   2. IBS, alternating bowel pattern. Continue hyoscamine 0.375 bid and hysocyamine 0.125-025 mg q4h prn. Increase dietary fiber intake.   3. Fecal incontinence. Markedly decreased sphincter tone. Schedule anorectal manometry. Avoid foods that increase diarrhea. Increase dietary fiber intake.  4. LLQ bulge. No hernia noted on 2 CT scans in February. Follow up with Dr. Redmond Pulling as needed.

## 2015-08-19 ENCOUNTER — Ambulatory Visit (HOSPITAL_COMMUNITY): Admission: RE | Admit: 2015-08-19 | Payer: 59 | Source: Ambulatory Visit | Admitting: Gastroenterology

## 2015-08-19 ENCOUNTER — Encounter (HOSPITAL_COMMUNITY): Admission: RE | Payer: Self-pay | Source: Ambulatory Visit

## 2015-08-19 SURGERY — MANOMETRY, ANORECTAL

## 2015-08-19 MED FILL — METHYLPHENIDATE 20 MG TAB: 20 | 30 days supply | Qty: 90 | Fill #0

## 2015-08-26 ENCOUNTER — Other Ambulatory Visit: Payer: Self-pay

## 2015-08-26 DIAGNOSIS — N3289 Other specified disorders of bladder: Secondary | ICD-10-CM

## 2015-08-26 DIAGNOSIS — K589 Irritable bowel syndrome without diarrhea: Secondary | ICD-10-CM

## 2015-08-26 MED ORDER — HYOSCYAMINE SULFATE 0.125 MG SL SUBL: 0.2500 mg | SUBLINGUAL_TABLET | SUBLINGUAL | Status: DC | PRN

## 2015-08-26 MED FILL — LEVOTHYROXINE 125 MCG TAB: 125 | 90 days supply | Qty: 90 | Fill #1

## 2015-08-26 MED FILL — OSCIMIN SR 0.375 MG TABLET: 0.375 | 30 days supply | Qty: 120 | Fill #2 | Status: TO

## 2015-08-26 MED FILL — SERTRALINE HCL 100 MG TAB: 100 | 30 days supply | Qty: 30 | Fill #2 | Status: TO

## 2015-08-26 MED FILL — OSCIMIN SL 0.125 MG TABLET: 0.125 | 8 days supply | Qty: 100 | Fill #0

## 2015-09-10 ENCOUNTER — Other Ambulatory Visit: Payer: Self-pay | Admitting: Internal Medicine

## 2015-09-10 MED FILL — ONDANSETRON HCL 8 MG TABLET: 8 | 8 days supply | Qty: 24 | Fill #2 | Status: TO

## 2015-09-10 MED FILL — MECLIZINE 25 MG TABLET: 25 | 30 days supply | Qty: 90 | Fill #0

## 2015-09-13 ENCOUNTER — Encounter: Payer: Self-pay | Admitting: Internal Medicine

## 2015-09-13 ENCOUNTER — Ambulatory Visit (INDEPENDENT_AMBULATORY_CARE_PROVIDER_SITE_OTHER): Payer: 59 | Admitting: Internal Medicine

## 2015-09-13 VITALS — BP 122/80 | HR 72 | Temp 97.7°F | Resp 16 | Ht 63.0 in | Wt 152.2 lb

## 2015-09-13 DIAGNOSIS — R300 Dysuria: Secondary | ICD-10-CM | POA: Diagnosis not present

## 2015-09-13 MED ORDER — CIPROFLOXACIN HCL 500 MG PO TABS
500.0000 mg | ORAL_TABLET | Freq: Two times a day (BID) | ORAL | Status: AC
Start: 2015-09-13 — End: 2015-09-16

## 2015-09-13 MED ORDER — TRIMETHOPRIM 100 MG PO TABS: 100.0000 mg | ORAL_TABLET | Freq: Two times a day (BID) | ORAL | Status: DC

## 2015-09-13 MED FILL — CIPROFLOXACIN HCL 500 MG TA: 500 | 5 days supply | Qty: 10 | Fill #0

## 2015-09-13 MED FILL — TRIMETHOPRIM 100 MG TABLET: 100 | 45 days supply | Qty: 90 | Fill #0

## 2015-09-13 NOTE — Progress Notes (Signed)
   Subjective:    Patient ID: [name], female    DOB: Jul 27, 1950, 65 y.o.   MRN: [id number]  Urinary Tract Infection  Associated symptoms include frequency and urgency. Pertinent negatives include no chills, hematuria, nausea or vomiting.  Patient reports to the office for evaluation of urinary frequency.  She reports that she has been having symptoms since 09/09/15.  She reports that she has a history of very frequent UTI.  She reports that she really thinks that this is infection.  She has a history of a diagnosis of interstitial cystitis and also a history of urosepsis.  She has no fevers chills, vomiting.  Mild nausea.  No flank pain.  NO back pain.  She is drinking plenty of water.      Review of Systems  Constitutional: Negative for fever, chills and fatigue.  Gastrointestinal: Negative for nausea, vomiting, abdominal pain, diarrhea and constipation.  Genitourinary: Positive for dysuria, urgency and frequency. Negative for hematuria, vaginal bleeding, vaginal discharge and vaginal pain.       Objective:   Physical Exam  Constitutional: She is oriented to person, place, and time. She appears well-developed and well-nourished. No distress.  HENT:  Head: Normocephalic.  Mouth/Throat: Oropharynx is clear and moist. No oropharyngeal exudate.  Eyes: Conjunctivae are normal. No scleral icterus.  Neck: Normal range of motion. Neck supple. No JVD present. No thyromegaly present.  Cardiovascular: Normal rate, regular rhythm, normal heart sounds and intact distal pulses.  Exam reveals no gallop and no friction rub.   No murmur heard. Pulmonary/Chest: Effort normal and breath sounds normal. No respiratory distress. She has no wheezes. She has no rales. She exhibits no tenderness.  Abdominal: Soft. Bowel sounds are normal. She exhibits no distension and no mass. There is no tenderness. There is no rebound and no guarding.  Musculoskeletal: Normal range of motion.  Lymphadenopathy:    She  has no cervical adenopathy.  Neurological: She is alert and oriented to person, place, and time.  Skin: Skin is warm and dry. She is not diaphoretic.  Psychiatric: She has a normal mood and affect. Her behavior is normal. Judgment and thought content normal.  Nursing note and vitals reviewed.   Filed Vitals:   09/13/15 1139  BP: 122/80  Pulse: 72  Temp: 97.7 F (36.5 C)  Resp: 16         Assessment & Plan:    1. Dysuria -cipro -try daily trimethoprim - Urinalysis, Reflex Microscopic - Culture, Urine

## 2015-09-14 LAB — URINALYSIS, ROUTINE W REFLEX MICROSCOPIC
BILIRUBIN URINE: NEGATIVE
GLUCOSE, UA: NEGATIVE
HGB URINE DIPSTICK: NEGATIVE
Ketones, ur: NEGATIVE
Nitrite: NEGATIVE
PROTEIN: NEGATIVE
Specific Gravity, Urine: 1.01 (ref 1.001–1.035)
pH: 6.5 (ref 5.0–8.0)

## 2015-09-14 LAB — URINALYSIS, MICROSCOPIC ONLY
Casts: NONE SEEN [LPF]
Crystals: NONE SEEN [HPF]
RBC / HPF: NONE SEEN RBC/HPF (ref <=2)
Squamous Epithelial / LPF: NONE SEEN [HPF] (ref <=5)
YEAST: NONE SEEN [HPF]

## 2015-09-16 ENCOUNTER — Encounter: Payer: Self-pay | Admitting: Internal Medicine

## 2015-09-16 LAB — URINE CULTURE: Colony Count: >=100000

## 2015-09-30 MED FILL — OSCIMIN SR 0.375 MG TABLET: 0.375 | 30 days supply | Qty: 120 | Fill #0 | Status: TO

## 2015-09-30 MED FILL — PANTOPRAZOLE SOD DR 40 MG T: 40 | 90 days supply | Qty: 90 | Fill #0

## 2015-09-30 MED FILL — SERTRALINE HCL 100 MG TAB: 100 | 30 days supply | Qty: 30 | Fill #0

## 2015-10-21 ENCOUNTER — Encounter: Payer: Self-pay | Admitting: Internal Medicine

## 2015-10-21 ENCOUNTER — Ambulatory Visit (INDEPENDENT_AMBULATORY_CARE_PROVIDER_SITE_OTHER): Payer: 59 | Admitting: Internal Medicine

## 2015-10-21 ENCOUNTER — Other Ambulatory Visit: Payer: Self-pay | Admitting: Internal Medicine

## 2015-10-21 VITALS — BP 106/60 | HR 78 | Temp 98.2°F | Resp 18 | Ht 63.0 in | Wt 151.0 lb

## 2015-10-21 DIAGNOSIS — D696 Thrombocytopenia, unspecified: Secondary | ICD-10-CM

## 2015-10-21 DIAGNOSIS — N183 Chronic kidney disease, stage 3 (moderate): Secondary | ICD-10-CM | POA: Diagnosis not present

## 2015-10-21 DIAGNOSIS — E782 Mixed hyperlipidemia: Secondary | ICD-10-CM

## 2015-10-21 DIAGNOSIS — Z79899 Other long term (current) drug therapy: Secondary | ICD-10-CM | POA: Diagnosis not present

## 2015-10-21 DIAGNOSIS — E038 Other specified hypothyroidism: Secondary | ICD-10-CM | POA: Diagnosis not present

## 2015-10-21 DIAGNOSIS — I10 Essential (primary) hypertension: Secondary | ICD-10-CM

## 2015-10-21 DIAGNOSIS — N1831 Chronic kidney disease, stage 3a: Secondary | ICD-10-CM

## 2015-10-21 DIAGNOSIS — R7303 Prediabetes: Secondary | ICD-10-CM | POA: Diagnosis not present

## 2015-10-21 LAB — HEPATIC FUNCTION PANEL
ALBUMIN: 3.9 g/dL (ref 3.6–5.1)
ALT: 14 U/L (ref 6–29)
AST: 16 U/L (ref 10–35)
Alkaline Phosphatase: 62 U/L (ref 33–130)
BILIRUBIN INDIRECT: 0.2 mg/dL (ref 0.2–1.2)
Bilirubin, Direct: 0.1 mg/dL (ref <=0.2)
TOTAL PROTEIN: 6.6 g/dL (ref 6.1–8.1)
Total Bilirubin: 0.3 mg/dL (ref 0.2–1.2)

## 2015-10-21 LAB — CBC WITH DIFFERENTIAL/PLATELET
BASOS ABS: 0 {cells}/uL (ref 0–200)
Basophils Relative: 0 %
EOS ABS: 336 {cells}/uL (ref 15–500)
Eosinophils Relative: 7 %
HEMATOCRIT: 39.9 % (ref 35.0–45.0)
HEMOGLOBIN: 13.1 g/dL (ref 11.7–15.5)
LYMPHS ABS: 2208 {cells}/uL (ref 850–3900)
LYMPHS PCT: 46 %
MCH: 31.6 pg (ref 27.0–33.0)
MCHC: 32.8 g/dL (ref 32.0–36.0)
MCV: 96.1 fL (ref 80.0–100.0)
MONO ABS: 336 {cells}/uL (ref 200–950)
MPV: 10.4 fL (ref 7.5–12.5)
Monocytes Relative: 7 %
NEUTROS PCT: 40 %
Neutro Abs: 1920 cells/uL (ref 1500–7800)
Platelets: 290 10*3/uL (ref 140–400)
RBC: 4.15 MIL/uL (ref 3.80–5.10)
RDW: 13.9 % (ref 11.0–15.0)
WBC: 4.8 10*3/uL (ref 3.8–10.8)

## 2015-10-21 LAB — BASIC METABOLIC PANEL WITH GFR
BUN: 29 mg/dL — AB (ref 7–25)
CALCIUM: 9.4 mg/dL (ref 8.6–10.4)
CO2: 29 mmol/L (ref 20–31)
Chloride: 103 mmol/L (ref 98–110)
Creat: 1.31 mg/dL — ABNORMAL HIGH (ref 0.50–0.99)
GFR, EST AFRICAN AMERICAN: 50 mL/min — AB (ref >=60)
GFR, EST NON AFRICAN AMERICAN: 43 mL/min — AB (ref >=60)
GLUCOSE: 101 mg/dL — AB (ref 65–99)
Potassium: 4.2 mmol/L (ref 3.5–5.3)
Sodium: 140 mmol/L (ref 135–146)

## 2015-10-21 LAB — LIPID PANEL
Cholesterol: 161 mg/dL (ref 125–200)
HDL: 62 mg/dL (ref >=46)
LDL CALC: 79 mg/dL (ref <130)
TRIGLYCERIDES: 99 mg/dL (ref <150)
Total CHOL/HDL Ratio: 2.6 Ratio (ref <=5.0)
VLDL: 20 mg/dL (ref <30)

## 2015-10-21 LAB — HEMOGLOBIN A1C
HEMOGLOBIN A1C: 5 % (ref <5.7)
Mean Plasma Glucose: 97 mg/dL

## 2015-10-21 LAB — TSH: TSH: 0.52 m[IU]/L

## 2015-10-21 MED FILL — TRIMETHOPRIM 100 MG TABLET: 100 | 45 days supply | Qty: 90 | Fill #0

## 2015-10-21 MED FILL — SERTRALINE HCL 100 MG TAB: 100 | 90 days supply | Qty: 90 | Fill #0

## 2015-10-21 MED FILL — ATENOLOL 100 MG TABLET: 100 | 90 days supply | Qty: 90 | Fill #1

## 2015-10-21 NOTE — Progress Notes (Signed)
Assessment and Plan:  Hypertension:  -Continue medication,  -monitor blood pressure at home.  -Continue DASH diet.   -Reminder to go to the ER if any CP, SOB, nausea, dizziness, severe HA, changes vision/speech, left arm numbness and tingling, and jaw pain.  Cholesterol: -Continue diet and exercise.  -Check cholesterol.   Pre-diabetes: -Continue diet and exercise.  -Check A1C  Vitamin D Def: -check level -continue medications.   Chronic UTI -cont trimethoprim  ITP -CBC  Continue diet and meds as discussed. Further disposition pending results of labs.  HPI 65 y.o. female  presents for 3 month follow up with hypertension, hyperlipidemia, prediabetes and vitamin D.   Her blood pressure has been controlled at home, today their BP is BP: 106/60 mmHg.   She does not workout. She denies chest pain, shortness of breath, dizziness.   She is on cholesterol medication and denies myalgias. Her cholesterol is at goal. The cholesterol last visit was:   Lab Results  Component Value Date   CHOL 145 01/10/2015   HDL 56 01/10/2015   LDLCALC 79 01/10/2015   TRIG 48 01/10/2015   CHOLHDL 2.6 01/10/2015     She has been working on diet and exercise for prediabetes, and denies foot ulcerations, hyperglycemia, hypoglycemia , increased appetite, nausea, paresthesia of the feet, polydipsia, polyuria, visual disturbances, vomiting and weight loss. Last A1C in the office was:  Lab Results  Component Value Date   HGBA1C 5.6 01/10/2015    Patient is on Vitamin D supplement.  Lab Results  Component Value Date   VD25OH 55 01/10/2015     She reports that she has been doing well with her prophylactic for chronic UTIs.  She has not had an infection since we started her on daily trimethroprim.  She reports that she is very happy with it.    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  She is not currently on asa due to this.    Current Medications:  Current Outpatient  Prescriptions on File Prior to Visit  Medication Sig Dispense Refill  . acetaminophen (TYLENOL) 500 MG tablet Take 500 mg by mouth every 6 (six) hours as needed for pain.    Marland Kitchen ALPRAZolam (XANAX) 0.5 MG tablet Take 1/2 to 1 tablet 3 x day if needed for anxiety or sleep 90 tablet 2  . aspirin 81 MG chewable tablet Chew 81 mg by mouth daily.      Marland Kitchen atenolol (TENORMIN) 100 MG tablet TAKE 1 TABLET BY MOUTH DAILY 90 tablet 1  . B Complex-C (SUPER B COMPLEX PO) Take 1 tablet by mouth daily.     . calcium carbonate (OS-CAL) 600 MG TABS Take 600 mg by mouth daily.    . calcium carbonate (TUMS - DOSED IN MG ELEMENTAL CALCIUM) 500 MG chewable tablet Chew 1 tablet by mouth as needed for indigestion. Uses Rolaids    . Cholecalciferol (VITAMIN D-3) 5000 UNITS TABS Take 5,000 Units by mouth daily.     Marland Kitchen docusate sodium (COLACE) 100 MG capsule Take 100 mg by mouth 2 (two) times daily as needed for mild constipation.    . hyoscyamine (LEVSIN SL) 0.125 MG SL tablet Place 2 tablets (0.25 mg total) under the tongue every 4 (four) hours as needed. Stomach pain and bladder spasms 100 tablet 0  . levothyroxine (SYNTHROID, LEVOTHROID) 125 MCG tablet TAKE 1 TABLET BY MOUTH ONCE DAILY 90 tablet 3  . Magnesium 250 MG TABS Take 250 mg by mouth daily.    Marland Kitchen  meclizine (ANTIVERT) 25 MG tablet TAKE 1 TABLET BY MOUTH 3 TIMES DAILY AS NEEDED FOR NAUSEA 90 tablet 1  . Melatonin 3 MG TABS Take 1 tablet by mouth daily as needed (sleep).     . methylphenidate (RITALIN) 20 MG tablet 1/2 to 1 tab 3 x daily as needed for ADD or alertness 90 tablet 0  . Multiple Vitamins-Minerals (MULTIVITAMIN WITH MINERALS) tablet Take 1 tablet by mouth daily.    . ondansetron (ZOFRAN) 8 MG tablet TAKE 1 TABLET BY MOUTH 3 TIMES DAILY AS NEEDED FOR NAUSEA 24 tablet 3  . OSCIMIN SR 0.375 MG 12 hr tablet TAKE 1-2 TABLETS BY MOUTH TWICE DAILY 120 tablet PRN  . pantoprazole (PROTONIX) 40 MG tablet Take 1 tablet daily for acid indigestion & reflux 90 tablet 1    No current facility-administered medications on file prior to visit.    Medical History:  Past Medical History  Diagnosis Date  . OCD (obsessive compulsive disorder)   . IBS (irritable bowel syndrome)   . Diverticulosis   . Helicobacter pylori (H. pylori)   . History of IBS   . Prediabetes   . Migraines   . Vitamin D deficiency     Allergies:  Allergies  Allergen Reactions  . Ace Inhibitors Other (See Comments)    Acute renal failure (lisinopril)  . Cephalexin Anaphylaxis  . Macrobid [Nitrofurantoin] Other (See Comments)    Causes hyperthermia   . Codeine Nausea Only  . Sulfa Antibiotics Rash     Review of Systems:  Review of Systems  Constitutional: Negative for fever, chills and malaise/fatigue.  HENT: Negative for congestion, ear pain and sore throat.   Eyes: Negative.   Respiratory: Negative for cough, shortness of breath and wheezing.   Cardiovascular: Negative for chest pain, palpitations and leg swelling.  Gastrointestinal: Negative for heartburn, abdominal pain, diarrhea, constipation, blood in stool and melena.  Genitourinary: Negative.   Skin: Negative.   Neurological: Negative for dizziness, sensory change, loss of consciousness and headaches.  Psychiatric/Behavioral: Negative for depression. The patient is not nervous/anxious and does not have insomnia.     Family history- Review and unchanged  Social history- Review and unchanged  Physical Exam: BP 106/60 mmHg  Pulse 78  Temp(Src) 98.2 F (36.8 C) (Temporal)  Resp 18  Ht 5\' 3"  (1.6 m)  Wt 151 lb (68.493 kg)  BMI 26.76 kg/m2 Wt Readings from Last 3 Encounters:  10/21/15 151 lb (68.493 kg)  09/13/15 152 lb 3.2 oz (69.037 kg)  08/07/15 150 lb 12.8 oz (68.402 kg)    General Appearance: Well nourished well developed, in no apparent distress. Eyes: PERRLA, EOMs, conjunctiva no swelling or erythema ENT/Mouth: Ear canals normal without obstruction, swelling, erythma, discharge.  TMs normal  bilaterally.  Oropharynx moist, clear, without exudate, or postoropharyngeal swelling. Neck: Supple, thyroid normal,no cervical adenopathy  Respiratory: Respiratory effort normal, Breath sounds clear A&P without rhonchi, wheeze, or rale.  No retractions, no accessory usage. Cardio: RRR with no MRGs. Brisk peripheral pulses without edema.  Abdomen: Soft, + BS,  Non tender, no guarding, rebound, hernias, masses. Musculoskeletal: Full ROM, 5/5 strength, Normal gait Skin: Warm, dry without rashes, lesions, ecchymosis.  Neuro: Awake and oriented X 3, Cranial nerves intact. Normal muscle tone, no cerebellar symptoms. Psych: Normal affect, Insight and Judgment appropriate.    Starlyn Skeans, PA-C 10:41 AM [facility]

## 2015-11-04 MED FILL — FORFIVO XL 450 MG TABLET: 450 | 90 days supply | Qty: 90 | Fill #0

## 2015-12-02 ENCOUNTER — Other Ambulatory Visit: Payer: Self-pay | Admitting: Internal Medicine

## 2015-12-02 MED FILL — LEVOTHYROXINE 125 MCG TAB: 125 | 90 days supply | Qty: 90 | Fill #2

## 2015-12-02 MED FILL — TRIMETHOPRIM 100 MG TABLET: 100 | 45 days supply | Qty: 90 | Fill #0

## 2015-12-03 ENCOUNTER — Other Ambulatory Visit: Payer: Self-pay | Admitting: Internal Medicine

## 2015-12-17 ENCOUNTER — Other Ambulatory Visit: Payer: Self-pay | Admitting: Internal Medicine

## 2016-01-02 ENCOUNTER — Telehealth: Payer: 59 | Admitting: Physician Assistant

## 2016-01-02 DIAGNOSIS — J309 Allergic rhinitis, unspecified: Secondary | ICD-10-CM

## 2016-01-02 DIAGNOSIS — K1379 Other lesions of oral mucosa: Secondary | ICD-10-CM | POA: Diagnosis not present

## 2016-01-02 NOTE — Progress Notes (Signed)
E visit for Allergic Rhinitis We are sorry that you are not feeling well.  Her is how we plan to help!  Based on what you have shared with me it looks like you have Allergic Rhinitis.  Rhinitis is when a reaction occurs that causes nasal congestion, runny nose, sneezing, and itching.  Most types of rhinitis are caused by an inflammation and are associated with symptoms in the eyes ears or throat. There are several types of rhinitis.  The most common are acute rhinitis, which is usually caused by a viral illness, allergic or seasonal rhinitis, and nonallergic or year-round rhinitis.  Nasal allergies occur certain times of the year.  Allergic rhinitis is caused when allergens in the air trigger the release of histamine in the body.  Histamine causes itching, swelling, and fluid to build up in the fragile linings of the nasal passages, sinuses and eyelids.  An itchy nose and clear discharge are common.  The sores in the mouth could be viral or aphthous ulcers from trauma or irritation. Use a soft-bristled toothbrush. Get some over the counter Peroxyl mouthwash to use a few times a day to promote cleansing, pain relief and healing. I would recommend you see your primary provider so they can assess these sores further.  I recommend the following over the counter treatments: You should take a daily dose of antihistamine and Clarinex 5 mg take 1 tablet daily (Cannot take if pregnant or breastfeeding)  I also would recommend an over the counter nasal spray: Flonase 2 sprays into each nostril once daily  HOME CARE:   You can use an over-the-counter saline nasal spray as needed  Avoid areas where there is heavy dust, mites, or molds  Stay indoors on windy days during the pollen season  Keep windows closed in home, at least in bedroom; use air conditioner.  Use high-efficiency house air filter  Keep windows closed in car, turn AC on re-circulate  Avoid playing out with dog during pollen  season  GET HELP RIGHT AWAY IF:   If your symptoms do not improve within 10 days  You become short of breath  You develop yellow or green discharge from your nose for over 3 days  You have coughing fits  MAKE SURE YOU:   Understand these instructions  Will watch your condition  Will get help right away if you are not doing well or get worse  Thank you for choosing an e-visit. Your e-visit answers were reviewed by a board certified advanced clinical practitioner to complete your personal care plan. Depending upon the condition, your plan could have included both over the counter or prescription medications. Please review your pharmacy choice. Be sure that the pharmacy you have chosen is open so that you can pick up your prescription now.  If there is a problem you may message your provider in [place] to have the prescription routed to another pharmacy. Your safety is important to [place]. If you have drug allergies check your prescription carefully.  For the next 24 hours, you can use MyChart to ask questions about today's visit, request a non-urgent call back, or ask for a work or school excuse from your e-visit provider. You will get an email in the next two days asking about your experience. I hope that your e-visit has been valuable and will speed your recovery.

## 2016-01-09 ENCOUNTER — Other Ambulatory Visit: Payer: Self-pay | Admitting: Internal Medicine

## 2016-01-09 DIAGNOSIS — K219 Gastro-esophageal reflux disease without esophagitis: Secondary | ICD-10-CM

## 2016-01-09 MED FILL — PANTOPRAZOLE SOD DR 40 MG T: 40 | 90 days supply | Qty: 90 | Fill #0

## 2016-01-20 ENCOUNTER — Other Ambulatory Visit: Payer: Self-pay | Admitting: Internal Medicine

## 2016-01-20 MED FILL — TRIMETHOPRIM 100 MG TABLET: 100 | 45 days supply | Qty: 90 | Fill #0

## 2016-01-20 MED FILL — SERTRALINE HCL 100 MG TAB: 100 | 90 days supply | Qty: 90 | Fill #1

## 2016-01-27 MED FILL — ONDANSETRON HCL 8 MG TABLET: 8 | 8 days supply | Qty: 24 | Fill #0 | Status: TO

## 2016-02-05 ENCOUNTER — Encounter: Payer: Self-pay | Admitting: Internal Medicine

## 2016-02-07 ENCOUNTER — Other Ambulatory Visit: Payer: Self-pay | Admitting: Internal Medicine

## 2016-02-08 ENCOUNTER — Other Ambulatory Visit: Payer: Self-pay | Admitting: Internal Medicine

## 2016-02-08 MED ORDER — BUPROPION HCL ER (XL) 150 MG PO TB24: ORAL_TABLET | ORAL | 1 refills | Status: DC

## 2016-02-09 ENCOUNTER — Emergency Department (HOSPITAL_COMMUNITY): Payer: 59

## 2016-02-09 ENCOUNTER — Encounter (HOSPITAL_COMMUNITY): Payer: Self-pay

## 2016-02-09 ENCOUNTER — Emergency Department (HOSPITAL_COMMUNITY)
Admission: EM | Admit: 2016-02-09 | Discharge: 2016-02-09 | Disposition: A | Discharge status: Home / Self Care | Payer: 59 | Attending: Emergency Medicine | Admitting: Emergency Medicine

## 2016-02-09 DIAGNOSIS — I129 Hypertensive chronic kidney disease with stage 1 through stage 4 chronic kidney disease, or unspecified chronic kidney disease: Secondary | ICD-10-CM | POA: Diagnosis not present

## 2016-02-09 DIAGNOSIS — Z87891 Personal history of nicotine dependence: Secondary | ICD-10-CM | POA: Diagnosis not present

## 2016-02-09 DIAGNOSIS — M79642 Pain in left hand: Secondary | ICD-10-CM

## 2016-02-09 DIAGNOSIS — N183 Chronic kidney disease, stage 3 (moderate): Secondary | ICD-10-CM | POA: Insufficient documentation

## 2016-02-09 DIAGNOSIS — M7989 Other specified soft tissue disorders: Secondary | ICD-10-CM | POA: Diagnosis not present

## 2016-02-09 DIAGNOSIS — F909 Attention-deficit hyperactivity disorder, unspecified type: Secondary | ICD-10-CM | POA: Diagnosis not present

## 2016-02-09 DIAGNOSIS — E039 Hypothyroidism, unspecified: Secondary | ICD-10-CM | POA: Diagnosis not present

## 2016-02-09 DIAGNOSIS — Z79899 Other long term (current) drug therapy: Secondary | ICD-10-CM | POA: Insufficient documentation

## 2016-02-09 DIAGNOSIS — M25532 Pain in left wrist: Secondary | ICD-10-CM | POA: Diagnosis not present

## 2016-02-09 DIAGNOSIS — J449 Chronic obstructive pulmonary disease, unspecified: Secondary | ICD-10-CM | POA: Insufficient documentation

## 2016-02-09 DIAGNOSIS — M19042 Primary osteoarthritis, left hand: Secondary | ICD-10-CM | POA: Diagnosis not present

## 2016-02-09 DIAGNOSIS — Z7982 Long term (current) use of aspirin: Secondary | ICD-10-CM | POA: Diagnosis not present

## 2016-02-09 NOTE — Discharge Instructions (Signed)
Your x-ray findings have been consistent with osteoarthritis and possible early inflammatory arthritis. You may take Tylenol for pain. Use the wrist splint as needed for comfort. Follow up with her primary doctor at your appointment this week for further workup and referral. Please return to ED if your symptoms worsen.

## 2016-02-09 NOTE — ED Provider Notes (Signed)
[facility] DEPT Provider Note   CSN: [id number] Arrival date & time: 02/09/16  0730     History   Chief Complaint Chief Complaint  Patient presents with  . Hand Pain    HPI [name] is a 65 y.o. female.  65 year old Caucasian female history of migraines, hypertension, CK D presents to the ED today with left thumb and wrist pain. Patient states that she felt back in March due to mechanical fall and landed on her left thumb. Patient states she sent medically treated her symptoms at home and seemed to improve initially. However since then the pain has been intermittent. 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 States that it is worse with movement. She has tried nothing for the pain. She reports only minimal swelling without any erythema. Patient without history of gout. Patient states that she has to work tonight and would like a splint to help with the pain. Patient denies being diagnosed with arthritis however states that she feels this could be arthritis related.       Past Medical History:  Diagnosis Date  . Diverticulosis   . Helicobacter pylori (H. pylori)   . History of IBS   . IBS (irritable bowel syndrome)   . Migraines   . OCD (obsessive compulsive disorder)   . Prediabetes   . Vitamin D deficiency     Patient Active Problem List   Diagnosis Date Noted  . Thrombocytopenia ([place]) 06/19/2015  . Malnutrition of moderate degree 06/17/2015  . UTI (lower urinary tract infection) 06/16/2015  . Encephalopathy, metabolic [id number]  . CKD Stage 3 (GFR 55 ml/min) 01/10/2015  . IBS  01/10/2015  . Mixed hyperlipidemia 06/12/2014  . Medication management 06/12/2014  . LBBB (left bundle branch block) 07/24/2013  . HTN (hypertension) 07/24/2013  . Depression, controlled   . Diverticulosis   . ADHD (attention deficit hyperactivity disorder)   . OCD (obsessive compulsive disorder)   . Prediabetes   . Migraines   . Vitamin D deficiency   .  Hypothyroidism 04/05/2013  . COPD exacerbation ([place]) 04/05/2013    Past Surgical History:  Procedure Laterality Date  . BREAST ENHANCEMENT SURGERY    . BUNIONECTOMY     Bil  . COLON SURGERY     colonoscopy  . HEMORRHOID SURGERY    . OOPHORECTOMY    . TONSILLECTOMY      OB History    No data available       Home Medications    Prior to Admission medications   Medication Sig Start Date End Date Taking? Authorizing Provider  acetaminophen (TYLENOL) 500 MG tablet Take 500 mg by mouth every 6 (six) hours as needed for pain.    Historical Provider, MD  ALPRAZolam Duanne Moron) 0.5 MG tablet Take 1/2 to 1 tablet 3 x day if needed for anxiety or sleep 04/15/14   Unk [name], MD  aspirin 81 MG chewable tablet Chew 81 mg by mouth daily.      Historical Provider, MD  atenolol (TENORMIN) 100 MG tablet TAKE 1 TABLET BY MOUTH DAILY 02/07/16   Unk [name], MD  B Complex-C (SUPER B COMPLEX PO) Take 1 tablet by mouth daily.     Historical Provider, MD  buPROPion (WELLBUTRIN XL) 150 MG 24 hr tablet Take 3 tablets every morning for Mood / Depression 02/08/16 08/08/16  Unk [name], MD  calcium carbonate (OS-CAL) 600 MG TABS Take 600 mg by mouth daily.    Historical Provider, MD  calcium  carbonate (TUMS - DOSED IN MG ELEMENTAL CALCIUM) 500 MG chewable tablet Chew 1 tablet by mouth as needed for indigestion. Uses Rolaids    Historical Provider, MD  Cholecalciferol (VITAMIN D-3) 5000 UNITS TABS Take 5,000 Units by mouth daily.     Historical Provider, MD  docusate sodium (COLACE) 100 MG capsule Take 100 mg by mouth 2 (two) times daily as needed for mild constipation.    Historical Provider, MD  hyoscyamine (LEVSIN SL) 0.125 MG SL tablet Place 2 tablets (0.25 mg total) under the tongue every 4 (four) hours as needed. Stomach pain and bladder spasms 08/26/15   Unk [name], MD  levothyroxine (SYNTHROID, LEVOTHROID) 125 MCG tablet TAKE 1 TABLET BY MOUTH ONCE DAILY 01/14/15   Unk [name], MD    Magnesium 250 MG TABS Take 250 mg by mouth daily.    Historical Provider, MD  meclizine (ANTIVERT) 25 MG tablet TAKE 1 TABLET BY MOUTH 3 TIMES DAILY AS NEEDED FOR NAUSEA 09/10/15   Unk [name], MD  Melatonin 3 MG TABS Take 1 tablet by mouth daily as needed (sleep).     Historical Provider, MD  methylphenidate (RITALIN) 20 MG tablet 1/2 to 1 tab 3 x daily as needed for ADD or alertness 07/30/15 10/21/15  Starlyn Skeans, PA-C  Multiple Vitamins-Minerals (MULTIVITAMIN WITH MINERALS) tablet Take 1 tablet by mouth daily.    Historical Provider, MD  ondansetron (ZOFRAN) 8 MG tablet TAKE 1 TABLET BY MOUTH 3 TIMES DAILY AS NEEDED FOR NAUSEA 05/24/15   Unk [name], MD  OSCIMIN SR 0.375 MG 12 hr tablet TAKE 1-2 TABLETS BY MOUTH TWICE DAILY 03/19/15   Courtney Forcucci, PA-C  pantoprazole (PROTONIX) 40 MG tablet TAKE 1 TABLET BY MOUTH ONCE DAILY FOR ACID INDIGESTION AND REFLUX 01/09/16   Vicie Mutters, PA-C  sertraline (ZOLOFT) 100 MG tablet Take 1 tablet daily for Mood & Depression 10/21/15   Unk [name], MD  trimethoprim (TRIMPEX) 100 MG tablet TAKE 1 TABLET BY MOUTH TWICE DAILY 01/20/16   Unk [name], MD    Family History Family History  Problem Relation Age of Onset  . Colon polyps Mother   . Colon cancer Neg Hx     Social History Social History  Substance Use Topics  . Smoking status: Former Smoker    Packs/day: 1.00    Years: 40.00    Types: Cigarettes    Quit date: 04/27/2002  . Smokeless tobacco: Never Used  . Alcohol use No     Comment: ocassional     Allergies   Ace inhibitors; Cephalexin; Macrobid [nitrofurantoin]; Codeine; and Sulfa antibiotics   Review of Systems Review of Systems  Constitutional: Negative for chills and fever.  HENT: Negative for congestion and rhinorrhea.   Eyes: Negative for discharge.  Respiratory: Negative for cough and shortness of breath.   Cardiovascular: Negative for chest pain and palpitations.  Gastrointestinal: Negative for  abdominal pain, nausea and vomiting.  Genitourinary: Negative for frequency and urgency.  Musculoskeletal: Positive for arthralgias and joint swelling (minimal).  Skin: Negative for color change and rash.  Neurological: Negative for dizziness, syncope, weakness, light-headedness and headaches.     Physical Exam Updated Vital Signs BP 125/99 (BP Location: Right Arm)   Pulse 74   Temp 98.5 F (36.9 C) (Oral)   Resp 18   SpO2 100%   Physical Exam  Constitutional: She appears well-developed and well-nourished. No distress.  Eyes: Right eye exhibits no discharge. Left eye exhibits no discharge. No scleral icterus.  Cardiovascular:  Pulses:  Radial pulses are 2+ on the right side, and 2+ on the left side.       Dorsalis pedis pulses are 2+ on the right side, and 2+ on the left side.  Pulmonary/Chest: No respiratory distress.  Musculoskeletal: Normal range of motion.  Patient with pain over the 1st metacarpal. There is minimal swelling. No erythema appreicated. Patient with full ROM that causes pain. No deformity noted. Patient is tender over the thenar imminence and schapoid tenderness. The pain radiates to the distal radius. ROM is normal. No elbow pain. Sensation intact. Cap refill normal. DP pulses are 2+ bilaterally. No other joints are tender, erythematous or edematous.   Neurological: She is alert.  Skin: Skin is warm and dry. Capillary refill takes less than 2 seconds. No pallor.  Nursing note and vitals reviewed.    ED Treatments / Results  Labs (all labs ordered are listed, but only abnormal results are displayed) Labs Reviewed - No data to display  EKG  EKG Interpretation None       Radiology Dg Wrist Complete Left  Result Date: 02/09/2016 CLINICAL DATA:  Left wrist pain for 2 weeks. Pain with supination. Pain at base of thumb. EXAM: LEFT WRIST - COMPLETE 3+ VIEW COMPARISON:  Hand radiographs from the same day. FINDINGS: Degenerative change and subluxation is  noted at the first [facility] joint. Soft tissue swelling is present over the dorsum of the MCP joints. The wrist is located. No acute or osseous abnormality is present at the wrist. IMPRESSION: 1. No acute or focal abnormality within the wrist. 2. Mild degenerative change in subluxation of the first [facility] joint, suggesting osteoarthritis. Electronically Signed   By: San Morelle M.D.   On: 02/09/2016 08:40   Dg Hand Complete Left  Result Date: 02/09/2016 CLINICAL DATA:  Left wrist and hand pain for 2 weeks. Pain is worse with supination of the hand. Thumb pain. EXAM: LEFT HAND - COMPLETE 3+ VIEW COMPARISON:  Wrist radiographs from the same day. FINDINGS: Degenerative changes and mild subluxation are noted at the first [facility]  joint. There is soft tissue swelling over the dorsum of the MCP joints. No acute or other focal osseous abnormality is present. IMPRESSION: 1. Mild degenerative change and subluxation at the first [facility] joint compatible with osteoarthritis. 2. Soft tissue swelling of the dorsal aspect of the MCP joint suggesting an inflammatory arthritis. Electronically Signed   By: San Morelle M.D.   On: 02/09/2016 08:36    Procedures Procedures (including critical care time)  Medications Ordered in ED Medications - No data to display   Initial Impression / Assessment and Plan / ED Course  I have reviewed the triage vital signs and the nursing notes.  Pertinent labs & imaging results that were available during my care of the patient were reviewed by me and considered in my medical decision making (see chart for details).  Clinical Course  Patient presents to ED with complaint of left thumb and wrist pain. Patient stating fall back in March having intermittent pain since. X-ray of left hand and wrist reveals osteoarthritis. Of note soft tissue swelling of the dorsal aspect of the MCP joint suggesting an inflammatory arthritis. Patient without history of arthritis. Patient does have  family history of rheumatoid arthritis. She has an appointment with her PCP this week for routine care. Encouraged to keep her appointment and follow-up for possible rheumatology referral. Patient was given a splint for comfort states that she hoped for tonight. Encouraged to take Tylenol at  home for pain. Given strict return precautions. Patient verbalized understanding the plan of care. Patient is hemolytically stable and discharged home in no acute distress.  Final Clinical Impressions(s) / ED Diagnoses   Final diagnoses:  Osteoarthritis of left hand, unspecified osteoarthritis type  Left hand pain    New Prescriptions New Prescriptions   No medications on file     Doristine Devoid, PA-C 02/09/16 [place], MD 02/09/16 [phone]

## 2016-02-09 NOTE — ED Triage Notes (Signed)
She c/o severe left thumb pain, mostly at thenar eminence radiating into distal ant. Left forearm/wrist.

## 2016-02-10 MED FILL — ATENOLOL 100 MG TABLET: 100 | 30 days supply | Qty: 30 | Fill #0

## 2016-02-10 MED FILL — BUPROPION HCL XL 150 MG TAB: 150 | 90 days supply | Qty: 270 | Fill #0

## 2016-02-13 ENCOUNTER — Ambulatory Visit (INDEPENDENT_AMBULATORY_CARE_PROVIDER_SITE_OTHER): Payer: 59 | Admitting: Internal Medicine

## 2016-02-13 ENCOUNTER — Encounter: Payer: Self-pay | Admitting: Internal Medicine

## 2016-02-13 ENCOUNTER — Other Ambulatory Visit: Payer: Self-pay | Admitting: *Deleted

## 2016-02-13 VITALS — BP 126/80 | HR 64 | Temp 97.4°F | Resp 16 | Ht 63.5 in | Wt 154.8 lb

## 2016-02-13 DIAGNOSIS — Z1212 Encounter for screening for malignant neoplasm of rectum: Secondary | ICD-10-CM

## 2016-02-13 DIAGNOSIS — R7303 Prediabetes: Secondary | ICD-10-CM

## 2016-02-13 DIAGNOSIS — R5383 Other fatigue: Secondary | ICD-10-CM | POA: Diagnosis not present

## 2016-02-13 DIAGNOSIS — E559 Vitamin D deficiency, unspecified: Secondary | ICD-10-CM | POA: Diagnosis not present

## 2016-02-13 DIAGNOSIS — Z136 Encounter for screening for cardiovascular disorders: Secondary | ICD-10-CM

## 2016-02-13 DIAGNOSIS — K219 Gastro-esophageal reflux disease without esophagitis: Secondary | ICD-10-CM

## 2016-02-13 DIAGNOSIS — F988 Other specified behavioral and emotional disorders with onset usually occurring in childhood and adolescence: Secondary | ICD-10-CM

## 2016-02-13 DIAGNOSIS — Z79899 Other long term (current) drug therapy: Secondary | ICD-10-CM | POA: Diagnosis not present

## 2016-02-13 DIAGNOSIS — Z0000 Encounter for general adult medical examination without abnormal findings: Secondary | ICD-10-CM | POA: Diagnosis not present

## 2016-02-13 DIAGNOSIS — Z1211 Encounter for screening for malignant neoplasm of colon: Secondary | ICD-10-CM

## 2016-02-13 DIAGNOSIS — I10 Essential (primary) hypertension: Secondary | ICD-10-CM

## 2016-02-13 DIAGNOSIS — E782 Mixed hyperlipidemia: Secondary | ICD-10-CM

## 2016-02-13 DIAGNOSIS — E038 Other specified hypothyroidism: Secondary | ICD-10-CM

## 2016-02-13 DIAGNOSIS — Z0001 Encounter for general adult medical examination with abnormal findings: Secondary | ICD-10-CM | POA: Diagnosis not present

## 2016-02-13 DIAGNOSIS — F900 Attention-deficit hyperactivity disorder, predominantly inattentive type: Secondary | ICD-10-CM

## 2016-02-13 DIAGNOSIS — N390 Urinary tract infection, site not specified: Secondary | ICD-10-CM

## 2016-02-13 LAB — IRON AND TIBC
%SAT: 19 % (ref 11–50)
IRON: 69 ug/dL (ref 45–160)
TIBC: 354 ug/dL (ref 250–450)
UIBC: 285 ug/dL (ref 125–400)

## 2016-02-13 LAB — CBC WITH DIFFERENTIAL/PLATELET
BASOS ABS: 45 {cells}/uL (ref 0–200)
Basophils Relative: 1 %
Eosinophils Absolute: 360 cells/uL (ref 15–500)
Eosinophils Relative: 8 %
HEMATOCRIT: 39.6 % (ref 35.0–45.0)
HEMOGLOBIN: 13.1 g/dL (ref 11.7–15.5)
LYMPHS ABS: 1620 {cells}/uL (ref 850–3900)
Lymphocytes Relative: 36 %
MCH: 31.6 pg (ref 27.0–33.0)
MCHC: 33.1 g/dL (ref 32.0–36.0)
MCV: 95.7 fL (ref 80.0–100.0)
MONO ABS: 405 {cells}/uL (ref 200–950)
MPV: 9.9 fL (ref 7.5–12.5)
Monocytes Relative: 9 %
NEUTROS PCT: 46 %
Neutro Abs: 2070 cells/uL (ref 1500–7800)
Platelets: 308 10*3/uL (ref 140–400)
RBC: 4.14 MIL/uL (ref 3.80–5.10)
RDW: 13.6 % (ref 11.0–15.0)
WBC: 4.5 10*3/uL (ref 3.8–10.8)

## 2016-02-13 LAB — BASIC METABOLIC PANEL WITH GFR
BUN: 28 mg/dL — ABNORMAL HIGH (ref 7–25)
CALCIUM: 9.4 mg/dL (ref 8.6–10.4)
CHLORIDE: 103 mmol/L (ref 98–110)
CO2: 28 mmol/L (ref 20–31)
Creat: 1.26 mg/dL — ABNORMAL HIGH (ref 0.50–0.99)
GFR, EST NON AFRICAN AMERICAN: 45 mL/min — AB (ref >=60)
GFR, Est African American: 52 mL/min — ABNORMAL LOW (ref >=60)
GLUCOSE: 76 mg/dL (ref 65–99)
Potassium: 4.8 mmol/L (ref 3.5–5.3)
Sodium: 140 mmol/L (ref 135–146)

## 2016-02-13 LAB — HEPATIC FUNCTION PANEL
ALK PHOS: 59 U/L (ref 33–130)
ALT: 16 U/L (ref 6–29)
AST: 18 U/L (ref 10–35)
Albumin: 3.6 g/dL (ref 3.6–5.1)
BILIRUBIN INDIRECT: 0.3 mg/dL (ref 0.2–1.2)
Bilirubin, Direct: 0.1 mg/dL (ref <=0.2)
TOTAL PROTEIN: 6.4 g/dL (ref 6.1–8.1)
Total Bilirubin: 0.4 mg/dL (ref 0.2–1.2)

## 2016-02-13 LAB — LIPID PANEL
CHOLESTEROL: 168 mg/dL (ref 125–200)
HDL: 59 mg/dL (ref >=46)
LDL CALC: 91 mg/dL (ref <130)
Total CHOL/HDL Ratio: 2.8 Ratio (ref <=5.0)
Triglycerides: 90 mg/dL (ref <150)
VLDL: 18 mg/dL (ref <30)

## 2016-02-13 LAB — MAGNESIUM: Magnesium: 1.8 mg/dL (ref 1.5–2.5)

## 2016-02-13 LAB — TSH: TSH: 2.44 m[IU]/L

## 2016-02-13 MED ORDER — METHYLPHENIDATE HCL 20 MG PO TABS: ORAL_TABLET | ORAL | 0 refills | Status: DC

## 2016-02-13 NOTE — Progress Notes (Signed)
[name] Unk [name], M.D.    Uvaldo Bristle. Silverio Lay, P.A.-C      Starlyn Skeans, P.A.-C  [facility]                [address] [place], [place] [id number] Telephone [phone] Telefax [phone]  Annual Screening/Preventative Visit & Comprehensive Evaluation &  Examination     This very nice 65 y.o. DWF presents for a Screening/Preventative Visit & comprehensive evaluation and management of multiple medical co-morbidities.  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 Patient also has hx/o Depression which seems compensated on her Wellbutrin & Zoloft. Other problems include GERD & IBS also quiescent on prudent diet and current meds.       HTN predates circa 2003. Patient's BP has been controlled at home and patient denies any cardiac symptoms as chest pain, palpitations, shortness of breath, dizziness or ankle swelling. Today's BP is 126/80.      Patient also has been on Thyroid Replacement since the 1990's.  Patient's hyperlipidemia is controlled with diet and medications. Patient denies myalgias or other medication SE's. Last lipids were at goal: Lab Results  Component Value Date   CHOL 161 10/21/2015   HDL 62 10/21/2015   LDLCALC 79 10/21/2015   TRIG 99 10/21/2015   CHOLHDL 2.6 10/21/2015      Patient has prediabetes with A1c 5.8% in 2011 and A1c 6.0% in 2012 predating since      and patient denies reactive hypoglycemic symptoms, visual blurring, diabetic polys, or paresthesias. Last A1c was at goal: Lab Results  Component Value Date   HGBA1C 5.0 10/21/2015      Finally, patient has history of Vitamin D Deficiency in 2008 of "18" and last Vitamin D was  Lab Results  Component Value Date   VD25OH 41 01/10/2015    Current Outpatient Prescriptions on File Prior to Visit  Medication Sig  . acetaminophen (TYLENOL) 500 MG tablet Take 500 mg by mouth every 6 (six) hours as needed for pain.  Marland Kitchen ALPRAZolam (XANAX) 0.5 MG tablet Take 1/2 to 1 tablet 3 x day if needed for anxiety or sleep  . aspirin 81 MG chewable tablet Chew 81 mg by mouth daily.    Marland Kitchen atenolol (TENORMIN) 100 MG tablet TAKE 1 TABLET BY MOUTH DAILY  . B Complex-C (SUPER B COMPLEX PO) Take 1 tablet by mouth daily.   Marland Kitchen buPROPion (WELLBUTRIN XL) 150 MG 24 hr tablet Take 3 tablets every morning for Mood / Depression  . calcium carbonate (OS-CAL) 600 MG TABS Take 600 mg by mouth daily.  . calcium carbonate (TUMS - DOSED IN MG ELEMENTAL CALCIUM) 500 MG chewable tablet Chew 1 tablet by mouth as needed for indigestion. Uses Rolaids  . Cholecalciferol (VITAMIN D-3) 5000 UNITS TABS Take 5,000 Units by mouth daily.   Marland Kitchen docusate sodium (COLACE) 100 MG capsule Take 100 mg by mouth 2 (two) times daily as needed for mild constipation.  . hyoscyamine (LEVSIN SL) 0.125 MG SL tablet Place 2 tablets (0.25 mg total) under the tongue every 4 (four) hours as needed. Stomach pain and bladder spasms  . levothyroxine (SYNTHROID, LEVOTHROID) 125 MCG tablet TAKE 1 TABLET BY MOUTH ONCE DAILY  . Magnesium 250  MG TABS Take 250 mg by mouth daily.  . meclizine (ANTIVERT) 25 MG tablet TAKE 1 TABLET BY MOUTH 3 TIMES DAILY AS NEEDED FOR NAUSEA  . Melatonin 3 MG TABS Take 1 tablet by mouth daily as needed (sleep).   . Multiple Vitamins-Minerals (MULTIVITAMIN WITH MINERALS) tablet Take 1 tablet by mouth daily.  . ondansetron (ZOFRAN) 8 MG tablet TAKE 1 TABLET BY MOUTH 3 TIMES DAILY AS NEEDED FOR NAUSEA  . OSCIMIN SR 0.375 MG 12 hr tablet TAKE 1-2 TABLETS BY MOUTH TWICE DAILY  . pantoprazole (PROTONIX) 40 MG tablet TAKE 1 TABLET BY MOUTH ONCE DAILY FOR ACID INDIGESTION AND REFLUX  . sertraline (ZOLOFT) 100 MG tablet Take 1 tablet daily for Mood & Depression  . trimethoprim  (TRIMPEX) 100 MG tablet TAKE 1 TABLET BY MOUTH TWICE DAILY   No current facility-administered medications on file prior to visit.    Allergies  Allergen Reactions  . Ace Inhibitors Other (See Comments)    Acute renal failure (lisinopril)  . Cephalexin Anaphylaxis  . Macrobid [Nitrofurantoin] Other (See Comments)    Causes hyperthermia   . Codeine Nausea Only  . Sulfa Antibiotics Rash   Past Medical History:  Diagnosis Date  . Diverticulosis   . Helicobacter pylori (H. pylori)   . History of IBS   . IBS (irritable bowel syndrome)   . Migraines   . OCD (obsessive compulsive disorder)   . Prediabetes   . Vitamin D deficiency    Health Maintenance  Topic Date Due  . TETANUS/TDAP  06/29/1970  . PAP SMEAR  06/28/1972  . MAMMOGRAM  11/10/2015  . INFLUENZA VACCINE  11/26/2015  . COLONOSCOPY  11/16/2017  . ZOSTAVAX  Completed  . Hepatitis C Screening  Completed  . HIV Screening  Completed   Immunization History  Administered Date(s) Administered  . Influenza-Unspecified 01/31/2016  . Pneumococcal Polysaccharide-23 02/12/1999  . Td 04/27/2013  . Zoster 11/24/2005   Past Surgical History:  Procedure Laterality Date  . BREAST ENHANCEMENT SURGERY    . BUNIONECTOMY     Bil  . COLON SURGERY     colonoscopy  . HEMORRHOID SURGERY    . OOPHORECTOMY    . TONSILLECTOMY     Family History  Problem Relation Age of Onset  . Colon polyps Mother   . Colon cancer Neg Hx    Social History  Substance Use Topics  . Smoking status: Former Smoker    Packs/day: 1.00    Years: 40.00    Types: Cigarettes    Quit date: 04/27/2002  . Smokeless tobacco: Never Used  . Alcohol use No     Comment: ocassional    ROS Constitutional: Denies fever, chills, weight loss/gain, headaches, insomnia,  night sweats, and change in appetite. Does c/o fatigue. Eyes: Denies redness, blurred vision, diplopia, discharge, itchy, watery eyes.  ENT: Denies discharge, congestion, post nasal drip,  epistaxis, sore throat, earache, hearing loss, dental pain, Tinnitus, Vertigo, Sinus pain, snoring.  Cardio: Denies chest pain, palpitations, irregular heartbeat, syncope, dyspnea, diaphoresis, orthopnea, PND, claudication, edema Respiratory: denies cough, dyspnea, DOE, pleurisy, hoarseness, laryngitis, wheezing.  Gastrointestinal: Denies dysphagia, heartburn, reflux, water brash, pain, cramps, nausea, vomiting, bloating, diarrhea, constipation, hematemesis, melena, hematochezia, jaundice, hemorrhoids Genitourinary:  Has occas dysuria, frequency and urgency w/o nocturia, hesitancy, discharge, hematuria, flank pain Breast: Breast lumps, nipple discharge, bleeding.  Musculoskeletal: Denies arthralgia, myalgia, stiffness, Jt. Swelling, pain, limp, and strain/sprain. Denies falls. Skin: Denies puritis, rash, hives, warts, acne, eczema, changing in skin lesion  Neuro: No weakness, tremor, incoordination, spasms, paresthesia, pain Psychiatric: Denies confusion, memory loss, sensory loss. Denies Depression. Endocrine: Denies change in weight, skin, hair change, nocturia, and paresthesia, diabetic polys, visual blurring, hyper / hypo glycemic episodes.  Heme/Lymph: No excessive bleeding, bruising, enlarged lymph nodes.  Physical Exam  BP 126/80   Pulse 64   Temp 97.4 F (36.3 C)   Resp 16   Ht 5' 3.5" (1.613 m)   Wt 154 lb 12.8 oz (70.2 kg)   BMI 26.99 kg/m   General Appearance: Well nourished and in no apparent distress.  Eyes: PERRLA, EOMs, conjunctiva no swelling or erythema, normal fundi and vessels. Sinuses: No frontal/maxillary tenderness ENT/Mouth: EACs patent / TMs  nl. Nares clear without erythema, swelling, mucoid exudates. Oral hygiene is good. No erythema, swelling, or exudate. Tongue normal, non-obstructing. Tonsils not swollen or erythematous. Hearing normal.  Neck: Supple, thyroid normal. No bruits, nodes or JVD. Respiratory: Respiratory effort normal.  BS equal and clear  bilateral without rales, rhonci, wheezing or stridor. Cardio: Heart sounds are normal with regular rate and rhythm and no murmurs, rubs or gallops. Peripheral pulses are normal and equal bilaterally without edema. No aortic or femoral bruits. Chest: symmetric with normal excursions and percussion. Breasts: Symmetric w/ palpable soft implants without lumps, nipple discharge, retractions, or fibrocystic changes.  Abdomen: Flat, soft with bowel sounds active. Nontender, no guarding, rebound, hernias, masses, or organomegaly.  Lymphatics: Non tender without lymphadenopathy.  Musculoskeletal: Full ROM all peripheral extremities, joint stability, 5/5 strength, and normal gait. Skin: Warm and dry without rashes, lesions, cyanosis, clubbing or  ecchymosis.  Neuro: Cranial nerves intact, reflexes equal bilaterally. Normal muscle tone, no cerebellar symptoms. Sensation intact.  Pysch: Alert and oriented X 3, normal affect, Insight and Judgment appropriate.   Assessment and Plan  1. Annual Preventative Screening Examination  - Microalbumin / creatinine urine ratio - EKG 12-Lead - POC Hemoccult Bld/Stl  - Urinalysis, Routine w reflex microscopic  - Vitamin B12 - Iron and TIBC - CBC with Differential/Platelet - BASIC METABOLIC PANEL WITH GFR - Hepatic function panel - Magnesium - Lipid panel - TSH - Hemoglobin A1c - Insulin, random - VITAMIN D 25 Hydroxy - Urine culture  2. Essential hypertension  - Microalbumin / creatinine urine ratio - EKG 12-Lead - TSH  3. Mixed hyperlipidemia  - Lipid panel - TSH  4. Prediabetes  - Hemoglobin A1c - Insulin, random  5. Vitamin D deficiency  - VITAMIN D 25 Hydroxy   6. Gastroesophageal reflux disease   7. Hypothyroidism  - TSH  8. Attention deficit hyperactivity disorder (ADHD), predominantly inattentive type   9. Urinary tract infection without hematuria  - Urinalysis, Routine w reflex microscopic  - Urine culture  10.  Screening for ischemic heart disease   11. Screening for colorectal cancer  - POC Hemoccult Bld/Stl   12. Other fatigue  - Vitamin B12 - Iron and TIBC - CBC with Differential/Platelet - TSH  13. Medication management  - CBC with Differential/Platelet - BASIC METABOLIC PANEL WITH GFR - Hepatic function panel - Magnesium     Continue prudent diet as discussed, weight control, BP monitoring, regular exercise, and medications. Discussed med's effects and SE's. Screening labs and tests as requested with regular follow-up as recommended. Over 40 minutes of exam, counseling, chart review and high complex critical decision making was performed.

## 2016-02-13 NOTE — Patient Instructions (Signed)

## 2016-02-14 LAB — VITAMIN D 25 HYDROXY (VIT D DEFICIENCY, FRACTURES): Vit D, 25-Hydroxy: 80 ng/mL (ref 30–100)

## 2016-02-14 LAB — URINALYSIS, ROUTINE W REFLEX MICROSCOPIC
Bilirubin Urine: NEGATIVE
Glucose, UA: NEGATIVE
HGB URINE DIPSTICK: NEGATIVE
KETONES UR: NEGATIVE
Leukocytes, UA: NEGATIVE
NITRITE: NEGATIVE
Protein, ur: NEGATIVE
Specific Gravity, Urine: 1.012 (ref 1.001–1.035)
pH: 6.5 (ref 5.0–8.0)

## 2016-02-14 LAB — HEMOGLOBIN A1C
Hgb A1c MFr Bld: 5.2 % (ref <5.7)
Mean Plasma Glucose: 103 mg/dL

## 2016-02-14 LAB — MICROALBUMIN / CREATININE URINE RATIO
CREATININE, URINE: 58 mg/dL (ref 20–320)
MICROALB UR: 0.3 mg/dL
MICROALB/CREAT RATIO: 5 ug/mg{creat} (ref <30)

## 2016-02-14 LAB — INSULIN, RANDOM: Insulin: 3 u[IU]/mL (ref 2.0–19.6)

## 2016-02-14 LAB — URINE CULTURE: Organism ID, Bacteria: NO GROWTH

## 2016-02-14 LAB — VITAMIN B12

## 2016-02-17 MED FILL — METHYLPHENIDATE 20 MG TAB: 20 | 30 days supply | Qty: 90 | Fill #0

## 2016-03-02 ENCOUNTER — Other Ambulatory Visit: Payer: Self-pay | Admitting: Internal Medicine

## 2016-03-02 DIAGNOSIS — Z1231 Encounter for screening mammogram for malignant neoplasm of breast: Secondary | ICD-10-CM

## 2016-03-03 ENCOUNTER — Other Ambulatory Visit: Payer: Self-pay | Admitting: Internal Medicine

## 2016-03-03 MED FILL — LEVOTHYROXINE 125 MCG TAB: 125 | 90 days supply | Qty: 90 | Fill #0

## 2016-03-09 ENCOUNTER — Other Ambulatory Visit: Payer: Self-pay | Admitting: Internal Medicine

## 2016-03-09 MED FILL — OSCIMIN SR 0.375 MG TABLET: 0.375 | 30 days supply | Qty: 120 | Fill #0

## 2016-03-09 MED FILL — ATENOLOL 100 MG TABLET: 100 | 30 days supply | Qty: 30 | Fill #1

## 2016-03-09 MED FILL — TRIMETHOPRIM 100 MG TABLET: 100 | 45 days supply | Qty: 90 | Fill #0

## 2016-04-10 MED FILL — ATENOLOL 100 MG TABLET: 100 | 30 days supply | Qty: 30 | Fill #2

## 2016-04-14 DIAGNOSIS — Z6827 Body mass index (BMI) 27.0-27.9, adult: Secondary | ICD-10-CM | POA: Diagnosis not present

## 2016-04-14 DIAGNOSIS — Z01419 Encounter for gynecological examination (general) (routine) without abnormal findings: Secondary | ICD-10-CM | POA: Diagnosis not present

## 2016-04-15 DIAGNOSIS — H2513 Age-related nuclear cataract, bilateral: Secondary | ICD-10-CM | POA: Diagnosis not present

## 2016-04-15 DIAGNOSIS — H43393 Other vitreous opacities, bilateral: Secondary | ICD-10-CM | POA: Diagnosis not present

## 2016-05-04 ENCOUNTER — Ambulatory Visit
Admission: RE | Admit: 2016-05-04 | Discharge: 2016-05-04 | Disposition: A | Discharge status: Home / Self Care | Payer: 59 | Source: Ambulatory Visit | Attending: Internal Medicine | Admitting: Internal Medicine

## 2016-05-04 ENCOUNTER — Other Ambulatory Visit: Payer: Self-pay | Admitting: Internal Medicine

## 2016-05-04 DIAGNOSIS — Z1231 Encounter for screening mammogram for malignant neoplasm of breast: Secondary | ICD-10-CM | POA: Diagnosis not present

## 2016-05-04 MED FILL — ONDANSETRON HCL 8 MG TABLET: 8 | 8 days supply | Qty: 24 | Fill #0

## 2016-05-04 MED FILL — SERTRALINE HCL 100 MG TAB: 100 | 90 days supply | Qty: 90 | Fill #0

## 2016-05-07 ENCOUNTER — Other Ambulatory Visit: Payer: Self-pay | Admitting: Internal Medicine

## 2016-05-07 DIAGNOSIS — K589 Irritable bowel syndrome without diarrhea: Secondary | ICD-10-CM

## 2016-05-07 MED FILL — TRIMETHOPRIM 100 MG TABLET: 100 | 45 days supply | Qty: 90 | Fill #1

## 2016-05-07 MED FILL — HYOSCYAMINE 0.125 MG TAB SL: 0.125 | 17 days supply | Qty: 100 | Fill #0

## 2016-05-08 MED FILL — BUPROPION HCL XL 150 MG TAB: 150 | 90 days supply | Qty: 270 | Fill #1

## 2016-05-08 MED FILL — ATENOLOL 100 MG TABLET: 100 | 30 days supply | Qty: 30 | Fill #3

## 2016-05-18 DIAGNOSIS — H2513 Age-related nuclear cataract, bilateral: Secondary | ICD-10-CM | POA: Diagnosis not present

## 2016-05-27 ENCOUNTER — Ambulatory Visit (INDEPENDENT_AMBULATORY_CARE_PROVIDER_SITE_OTHER): Payer: 59 | Admitting: Internal Medicine

## 2016-05-27 DIAGNOSIS — E559 Vitamin D deficiency, unspecified: Secondary | ICD-10-CM | POA: Diagnosis not present

## 2016-05-27 DIAGNOSIS — K219 Gastro-esophageal reflux disease without esophagitis: Secondary | ICD-10-CM | POA: Diagnosis not present

## 2016-05-27 DIAGNOSIS — F988 Other specified behavioral and emotional disorders with onset usually occurring in childhood and adolescence: Secondary | ICD-10-CM | POA: Diagnosis not present

## 2016-05-27 DIAGNOSIS — F329 Major depressive disorder, single episode, unspecified: Secondary | ICD-10-CM

## 2016-05-27 DIAGNOSIS — N390 Urinary tract infection, site not specified: Secondary | ICD-10-CM

## 2016-05-27 DIAGNOSIS — E038 Other specified hypothyroidism: Secondary | ICD-10-CM | POA: Diagnosis not present

## 2016-05-27 DIAGNOSIS — R7303 Prediabetes: Secondary | ICD-10-CM | POA: Diagnosis not present

## 2016-05-27 DIAGNOSIS — F32A Depression, unspecified: Secondary | ICD-10-CM

## 2016-05-27 DIAGNOSIS — I10 Essential (primary) hypertension: Secondary | ICD-10-CM

## 2016-05-27 MED ORDER — PANTOPRAZOLE SODIUM 40 MG PO TBEC: DELAYED_RELEASE_TABLET | ORAL | 0 refills | Status: DC

## 2016-05-27 MED ORDER — METHYLPHENIDATE HCL 20 MG PO TABS: ORAL_TABLET | ORAL | 0 refills | Status: DC

## 2016-05-27 MED FILL — METHYLPHENIDATE 20 MG TAB: 20 | 30 days supply | Qty: 90 | Fill #0

## 2016-05-27 MED FILL — PANTOPRAZOLE SOD DR 40 MG T: 40 | 90 days supply | Qty: 90 | Fill #0

## 2016-05-27 NOTE — Progress Notes (Signed)
Assessment and Plan:  Hypertension:  -Continue medication,  -monitor blood pressure at home.  -Continue DASH diet.   -Reminder to go to the ER if any CP, SOB, nausea, dizziness, severe HA, changes vision/speech, left arm numbness and tingling, and jaw pain.  Cholesterol: -Continue diet and exercise.   Pre-diabetes: -Continue diet and exercise.   Vitamin D Def: -continue medications.   ADHD medication -refilled today -recommend that once finished working she discontinues use  Depression and OCD -drop wellbutrin to 300 mg -is doing well  Frequent UTI -cont trimethoprim -has had sepsis from UTI in past  Continue diet and meds as discussed. Further disposition pending results of labs.  HPI 66 y.o. female  presents for 3 month follow up with hypertension, hyperlipidemia, prediabetes and vitamin D.   Her blood pressure has been controlled at home, today their BP is  .   She does workout. She denies chest pain, shortness of breath, dizziness.   She is on cholesterol medication and denies myalgias. Her cholesterol is at goal. The cholesterol last visit was:   Lab Results  Component Value Date   CHOL 168 02/13/2016   HDL 59 02/13/2016   LDLCALC 91 02/13/2016   TRIG 90 02/13/2016   CHOLHDL 2.8 02/13/2016     She has been working on diet and exercise for prediabetes, and denies foot ulcerations, hyperglycemia, hypoglycemia , increased appetite, nausea, paresthesia of the feet, polydipsia, polyuria, visual disturbances and vomiting. Last A1C in the office was:  Lab Results  Component Value Date   HGBA1C 5.2 02/13/2016    Patient is on Vitamin D supplement.  Lab Results  Component Value Date   VD25OH 47 02/13/2016     She reports that she would like to cut back on some of the medications she is taking.  She is wondering whether she can stop the trimethoprim for UTI prevention.  She is still taking daily cranberry and Vit C.    She retires in a week.  She feels like her  stress levels are much better and she would like to cut back to 300 mg of wellbutrin daily.  Current Medications:  Current Outpatient Prescriptions on File Prior to Visit  Medication Sig Dispense Refill  . acetaminophen (TYLENOL) 500 MG tablet Take 500 mg by mouth every 6 (six) hours as needed for pain.    Marland Kitchen ALPRAZolam (XANAX) 0.5 MG tablet Take 1/2 to 1 tablet 3 x day if needed for anxiety or sleep 90 tablet 2  . aspirin 81 MG chewable tablet Chew 81 mg by mouth daily.      Marland Kitchen atenolol (TENORMIN) 100 MG tablet TAKE 1 TABLET BY MOUTH DAILY 90 tablet 1  . B Complex-C (SUPER B COMPLEX PO) Take 1 tablet by mouth daily.     Marland Kitchen buPROPion (WELLBUTRIN XL) 150 MG 24 hr tablet Take 3 tablets every morning for Mood / Depression 270 tablet 1  . calcium carbonate (OS-CAL) 600 MG TABS Take 600 mg by mouth daily.    . calcium carbonate (TUMS - DOSED IN MG ELEMENTAL CALCIUM) 500 MG chewable tablet Chew 1 tablet by mouth as needed for indigestion. Uses Rolaids    . Cholecalciferol (VITAMIN D-3) 5000 UNITS TABS Take 5,000 Units by mouth daily.     Marland Kitchen docusate sodium (COLACE) 100 MG capsule Take 100 mg by mouth 2 (two) times daily as needed for mild constipation.    . hyoscyamine (LEVSIN SL) 0.125 MG SL tablet PLACE 1 TABLET (0.125 MG TOTAL)  UNDER THE TONGUE EVERY 4 (FOUR) HOURS AS NEEDED. 100 tablet 1  . levothyroxine (SYNTHROID, LEVOTHROID) 125 MCG tablet TAKE 1 TABLET BY MOUTH ONCE DAILY 90 tablet 2  . Magnesium 250 MG TABS Take 250 mg by mouth daily.    . meclizine (ANTIVERT) 25 MG tablet TAKE 1 TABLET BY MOUTH 3 TIMES DAILY AS NEEDED FOR NAUSEA 90 tablet 1  . Melatonin 3 MG TABS Take 1 tablet by mouth daily as needed (sleep).     . methylphenidate (RITALIN) 20 MG tablet 1/2 to 1 tab 3 x daily as needed for ADD or alertness 90 tablet 0  . Multiple Vitamins-Minerals (MULTIVITAMIN WITH MINERALS) tablet Take 1 tablet by mouth daily.    . ondansetron (ZOFRAN) 8 MG tablet TAKE 1 TABLET BY MOUTH 3 TIMES DAILY AS  NEEDED FOR NAUSEA 24 tablet 0  . OSCIMIN SR 0.375 MG 12 hr tablet TAKE 1-2 TABLETS BY MOUTH TWICE DAILY 120 tablet PRN  . pantoprazole (PROTONIX) 40 MG tablet TAKE 1 TABLET BY MOUTH ONCE DAILY FOR ACID INDIGESTION AND REFLUX 90 tablet 0  . sertraline (ZOLOFT) 100 MG tablet TAKE 1 TABLET BY MOUTH ONCE DAILY FOR MOOD AND DEPRESSION 90 tablet 1  . trimethoprim (TRIMPEX) 100 MG tablet TAKE 1 TABLET BY MOUTH TWICE DAILY 90 tablet 1   No current facility-administered medications on file prior to visit.     Medical History:  Past Medical History:  Diagnosis Date  . Diverticulosis   . Helicobacter pylori (H. pylori)   . History of IBS   . IBS (irritable bowel syndrome)   . Migraines   . OCD (obsessive compulsive disorder)   . Prediabetes   . Vitamin D deficiency     Allergies:  Allergies  Allergen Reactions  . Ace Inhibitors Other (See Comments)    Acute renal failure (lisinopril)  . Cephalexin Anaphylaxis  . Macrobid [Nitrofurantoin] Other (See Comments)    Causes hyperthermia   . Codeine Nausea Only  . Sulfa Antibiotics Rash     Review of Systems:  Review of Systems  Constitutional: Negative for chills, fever and malaise/fatigue.  HENT: Negative for congestion, ear pain and sore throat.   Eyes: Negative.   Respiratory: Negative for cough, shortness of breath and wheezing.   Cardiovascular: Negative for chest pain, palpitations and leg swelling.  Gastrointestinal: Negative for abdominal pain, blood in stool, constipation, diarrhea, heartburn and melena.  Genitourinary: Negative.   Skin: Negative.   Neurological: Negative for dizziness, sensory change, loss of consciousness and headaches.  Psychiatric/Behavioral: Negative for depression. The patient is not nervous/anxious and does not have insomnia.     Family history- Review and unchanged  Social history- Review and unchanged  Physical Exam: There were no vitals taken for this visit. Wt Readings from Last 3 Encounters:   02/13/16 154 lb 12.8 oz (70.2 kg)  10/21/15 151 lb (68.5 kg)  09/13/15 152 lb 3.2 oz (69 kg)    General Appearance: Well nourished well developed, in no apparent distress. Eyes: PERRLA, EOMs, conjunctiva no swelling or erythema ENT/Mouth: Ear canals normal without obstruction, swelling, erythma, discharge.  TMs normal bilaterally.  Oropharynx moist, clear, without exudate, or postoropharyngeal swelling. Neck: Supple, thyroid normal,no cervical adenopathy  Respiratory: Respiratory effort normal, Breath sounds clear A&P without rhonchi, wheeze, or rale.  No retractions, no accessory usage. Cardio: RRR with no MRGs. Brisk peripheral pulses without edema.  Abdomen: Soft, + BS,  Non tender, no guarding, rebound, hernias, masses. Musculoskeletal: Full ROM, 5/5 strength,  Normal gait Skin: Warm, dry without rashes, lesions, ecchymosis.  Neuro: Awake and oriented X 3, Cranial nerves intact. Normal muscle tone, no cerebellar symptoms. Psych: Normal affect, Insight and Judgment appropriate.    Starlyn Skeans, PA-C 9:07 AM [facility]

## 2016-06-08 MED FILL — LEVOTHYROXINE 125 MCG TAB: 125 | 90 days supply | Qty: 90 | Fill #1

## 2016-06-21 MED FILL — ATENOLOL 100 MG TABLET: 100 | 30 days supply | Qty: 30 | Fill #4

## 2016-06-22 ENCOUNTER — Other Ambulatory Visit: Payer: Self-pay | Admitting: Internal Medicine

## 2016-06-22 MED FILL — ONDANSETRON HCL 8 MG TABLET: 8 | 30 days supply | Qty: 30 | Fill #0

## 2016-06-22 MED FILL — HYOSCYAMINE 0.125 MG TAB SL: 0.125 | 17 days supply | Qty: 100 | Fill #1

## 2016-06-22 MED FILL — TRIMETHOPRIM 100 MG TABLET: 100 | 45 days supply | Qty: 90 | Fill #0

## 2016-06-24 MED FILL — PENICILLIN VK 500 MG TABLET: 500 | 7 days supply | Qty: 28 | Fill #0

## 2016-07-25 ENCOUNTER — Ambulatory Visit (HOSPITAL_COMMUNITY)
Admission: EM | Admit: 2016-07-25 | Discharge: 2016-07-25 | Disposition: A | Discharge status: Home / Self Care | Payer: Medicare Other | Attending: Internal Medicine | Admitting: Internal Medicine

## 2016-07-25 ENCOUNTER — Encounter (HOSPITAL_COMMUNITY): Payer: Self-pay | Admitting: Emergency Medicine

## 2016-07-25 DIAGNOSIS — R6884 Jaw pain: Secondary | ICD-10-CM

## 2016-07-25 DIAGNOSIS — Z9289 Personal history of other medical treatment: Secondary | ICD-10-CM

## 2016-07-25 HISTORY — DX: Disorder of kidney and ureter, unspecified: N28.9

## 2016-07-25 HISTORY — DX: Sepsis, unspecified organism: A41.9

## 2016-07-25 HISTORY — DX: Emphysema, unspecified: J43.9

## 2016-07-25 MED ORDER — AMOXICILLIN-POT CLAVULANATE 875-125 MG PO TABS: 1.0000 | ORAL_TABLET | Freq: Two times a day (BID) | ORAL | 0 refills | Status: AC

## 2016-07-25 NOTE — Discharge Instructions (Addendum)
Followup with dental provider to assess jaw pain for possibility of infection.  Prescription for augmentin sent to pharmacy, to cover this possibility.  Tetanus shot was updated in 2015.

## 2016-07-25 NOTE — ED Provider Notes (Addendum)
[place]    CSN: [id number] Arrival date & time: 07/25/16  1646     History   Chief Complaint Chief Complaint  Patient presents with  . Jaw Pain    HPI [name] is a 66 y.o. female. She presents today with right jaw pain, hurts to open mouth and chew.  Also has swelling/pain of left 3rd MCP.  Family hx Rheumatoid arthritis.  Has been working in yard and arms are all scratched up, wanted to be sure tetanus shot was up to date.  Had urosepsis in 2016, on trimethoprim.  Dental implants right upper bicuspid area a month ago.  No fever.  Feels a little tired.    HPI  Past Medical History:  Diagnosis Date  . Diverticulosis   . Emphysema lung ([place])   . Helicobacter pylori (H. pylori)   . History of IBS   . IBS (irritable bowel syndrome)   . Migraines   . OCD (obsessive compulsive disorder)   . Prediabetes   . Renal disorder    AKI due to lisinpril, his. pyelonephritis,   . Sepsis ([place])   . Vitamin D deficiency     Patient Active Problem List   Diagnosis Date Noted  . Thrombocytopenia ([place]) 06/19/2015  . Lower urinary tract infectious disease 06/16/2015  . CKD Stage 3 (GFR 55 ml/min) 01/10/2015  . IBS  01/10/2015  . Mixed hyperlipidemia 06/12/2014  . Medication management 06/12/2014  . LBBB (left bundle branch block) 07/24/2013  . HTN (hypertension) 07/24/2013  . Depression, controlled   . Diverticulosis   . ADHD (attention deficit hyperactivity disorder)   . OCD (obsessive compulsive disorder)   . Prediabetes   . Migraines   . Vitamin D deficiency   . Hypothyroidism 04/05/2013    Past Surgical History:  Procedure Laterality Date  . BREAST ENHANCEMENT SURGERY    . BUNIONECTOMY     Bil  . COLON SURGERY     colonoscopy  . HEMORRHOID SURGERY    . OOPHORECTOMY    . TONSILLECTOMY        Home Medications    Prior to Admission medications   Medication Sig Start Date End Date Taking? Authorizing Provider  acetaminophen (TYLENOL) 500 MG  tablet Take 500 mg by mouth every 6 (six) hours as needed for pain.    Historical Provider, MD  ALPRAZolam Duanne Moron) 0.5 MG tablet Take 1/2 to 1 tablet 3 x day if needed for anxiety or sleep 04/15/14   Unk [name], MD  amoxicillin-clavulanate (AUGMENTIN) 875-125 MG tablet Take 1 tablet by mouth 2 (two) times daily. 07/25/16 08/01/16  Sherlene Shams, MD  atenolol (TENORMIN) 100 MG tablet TAKE 1 TABLET BY MOUTH DAILY 02/07/16   Unk [name], MD  buPROPion (WELLBUTRIN XL) 150 MG 24 hr tablet Take 3 tablets every morning for Mood / Depression 02/08/16 08/08/16  Unk [name], MD  calcium carbonate (TUMS - DOSED IN MG ELEMENTAL CALCIUM) 500 MG chewable tablet Chew 1 tablet by mouth as needed for indigestion. Uses Rolaids    Historical Provider, MD  Cholecalciferol (VITAMIN D-3) 5000 UNITS TABS Take 5,000 Units by mouth daily.     Historical Provider, MD  docusate sodium (COLACE) 100 MG capsule Take 100 mg by mouth 2 (two) times daily as needed for mild constipation.    Historical Provider, MD  hyoscyamine (LEVSIN SL) 0.125 MG SL tablet PLACE 1 TABLET (0.125 MG TOTAL) UNDER THE TONGUE EVERY 4 (FOUR) HOURS AS NEEDED. 05/07/16   Gwyndolyn Saxon  Melford Aase, MD  levothyroxine (SYNTHROID, LEVOTHROID) 125 MCG tablet TAKE 1 TABLET BY MOUTH ONCE DAILY 03/03/16   Unk [name], MD  Magnesium 250 MG TABS Take 250 mg by mouth daily.    Historical Provider, MD  meclizine (ANTIVERT) 25 MG tablet TAKE 1 TABLET BY MOUTH 3 TIMES DAILY AS NEEDED FOR NAUSEA 09/10/15   Unk [name], MD  Melatonin 3 MG TABS Take 1 tablet by mouth daily as needed (sleep).     Historical Provider, MD  methylphenidate (RITALIN) 20 MG tablet 1/2 to 1 tab 3 x daily as needed for ADD or alertness 05/27/16 08/18/16  Courtney Forcucci, PA-C  Multiple Vitamins-Minerals (MULTIVITAMIN WITH MINERALS) tablet Take 1 tablet by mouth daily.    Historical Provider, MD  ondansetron (ZOFRAN) 8 MG tablet TAKE 1 TABLET BY MOUTH 3 TIMES DAILY AS NEEDED FOR NAUSEA 06/22/16    Unk [name], MD  OSCIMIN SR 0.375 MG 12 hr tablet TAKE 1-2 TABLETS BY MOUTH TWICE DAILY 03/19/15   Courtney Forcucci, PA-C  pantoprazole (PROTONIX) 40 MG tablet TAKE 1 TABLET BY MOUTH ONCE DAILY FOR ACID INDIGESTION AND REFLUX 05/27/16   Courtney Forcucci, PA-C  sertraline (ZOLOFT) 100 MG tablet TAKE 1 TABLET BY MOUTH ONCE DAILY FOR MOOD AND DEPRESSION 05/04/16   Unk [name], MD  trimethoprim (TRIMPEX) 100 MG tablet TAKE 1 TABLET BY MOUTH TWICE DAILY 06/22/16   Unk [name], MD    Family History Family History  Problem Relation Age of Onset  . Colon polyps Mother   . Colon cancer Neg Hx   rheumatoid arthritis   Social History Social History  Substance Use Topics  . Smoking status: Former Smoker    Packs/day: 1.00    Years: 40.00    Types: Cigarettes    Quit date: 04/27/2002  . Smokeless tobacco: Never Used  . Alcohol use No     Comment: ocassional     Allergies   Ace inhibitors; Cephalexin; Macrobid [nitrofurantoin]; and Codeine   Review of Systems Review of Systems  All other systems reviewed and are negative.    Physical Exam Triage Vital Signs ED Triage Vitals  Enc Vitals Group     BP 07/25/16 1706 126/77     Pulse Rate 07/25/16 1706 (!) 52     Resp 07/25/16 1706 14     Temp 07/25/16 1706 97.7 F (36.5 C)     Temp Source 07/25/16 1706 Oral     SpO2 07/25/16 1706 98 %     Weight 07/25/16 1740 157 lb (71.2 kg)     Height 07/25/16 1740 5\' 3"  (1.6 m)     Pain Score 07/25/16 1740 4     Pain Loc --    Updated Vital Signs BP 126/77 (BP Location: Right Arm)   Pulse (!) 52 Comment: notified rn  Temp 97.7 F (36.5 C) (Oral)   Resp 14   Ht 5\' 3"  (1.6 m)   Wt 157 lb (71.2 kg)   SpO2 98%   BMI 27.81 kg/m   Physical Exam  Constitutional: She is oriented to person, place, and time. No distress.  Alert, nicely groomed  HENT:  Head: Atraumatic.  Parotid glands non tender, no clicking with palpation of jaw, no trismus, able to open/close jaw but painful.   No swelling/erythema of right parotid papilla.  Dentition in good repair.  No facial swelling.  Eyes:  Conjugate gaze, no eye redness/drainage  Neck: Neck supple.  Cardiovascular: Normal rate.   Pulmonary/Chest: No respiratory distress.  Abdominal:  She exhibits no distension.  Musculoskeletal: Normal range of motion.  No leg swelling Left 3rd MCP swollen compared to right.  Neurological: She is alert and oriented to person, place, and time.  Skin: Skin is warm and dry.  No cyanosis Scratches on forearms, no significant surrounding erythema/swelling, not draining.    Nursing note and vitals reviewed.    UC Treatments / Results   Procedures Procedures (including critical care time) None today  Final Clinical Impressions(s) / UC Diagnoses   Final diagnoses:  Jaw pain  History of dental surgery   Followup with dental provider to assess jaw pain for possibility of infection.  Prescription for augmentin sent to pharmacy, to cover this possibility.  Tetanus shot was updated in 2015.  New Prescriptions Discharge Medication List as of 07/25/2016  6:20 PM    START taking these medications   Details  amoxicillin-clavulanate (AUGMENTIN) 875-125 MG tablet Take 1 tablet by mouth 2 (two) times daily., Starting Sat 07/25/2016, Until Sat 08/01/2016, Normal            Sherlene Shams, MD 07/26/16 2145

## 2016-07-25 NOTE — ED Triage Notes (Addendum)
PT has been working in the yard for the past two weeks. PT has multiple scratches from tree branches. PT woke up this AM with right sided jaw pain. PT is concerned about tetanus. PT denies any related cardiac symptoms. PT is unsure if her TDAP is UTD. PT is on trimethoprim daily due to urosepsis in Feb 2016. PT reports she had right sided tooth extraction and bone graft done approx 4 weeks ago

## 2016-07-27 ENCOUNTER — Other Ambulatory Visit: Payer: Self-pay | Admitting: Internal Medicine

## 2016-07-27 MED FILL — SERTRALINE HCL 100 MG TAB: 100 | 90 days supply | Qty: 90 | Fill #1

## 2016-07-27 MED FILL — ATENOLOL 100 MG TABLET: 100 | 30 days supply | Qty: 30 | Fill #5

## 2016-07-29 ENCOUNTER — Other Ambulatory Visit: Payer: Self-pay | Admitting: *Deleted

## 2016-07-29 MED ORDER — DICYCLOMINE HCL 10 MG PO CAPS: 10.0000 mg | ORAL_CAPSULE | Freq: Three times a day (TID) | ORAL | 1 refills | Status: DC

## 2016-07-29 MED FILL — DICYCLOMINE 10 MG CAPSULE: 10 | 90 days supply | Qty: 270 | Fill #0

## 2016-08-03 MED FILL — OSCIMIN SR 0.375 MG TABLET: 0.375 | 30 days supply | Qty: 120 | Fill #0

## 2016-08-04 ENCOUNTER — Other Ambulatory Visit: Payer: Self-pay | Admitting: Internal Medicine

## 2016-08-06 MED FILL — TRIMETHOPRIM 100 MG TABLET: 100 | 45 days supply | Qty: 90 | Fill #1

## 2016-08-20 ENCOUNTER — Other Ambulatory Visit: Payer: Self-pay | Admitting: Internal Medicine

## 2016-08-20 MED FILL — ATENOLOL 100 MG TABLET: 100 | 90 days supply | Qty: 90 | Fill #0

## 2016-08-31 MED FILL — LEVOTHYROXINE 125 MCG TABLE: 125 | 90 days supply | Qty: 90 | Fill #2

## 2016-09-01 ENCOUNTER — Encounter: Payer: Self-pay | Admitting: Internal Medicine

## 2016-09-01 ENCOUNTER — Ambulatory Visit (INDEPENDENT_AMBULATORY_CARE_PROVIDER_SITE_OTHER): Payer: PPO | Admitting: Internal Medicine

## 2016-09-01 VITALS — BP 122/76 | HR 72 | Temp 97.3°F | Resp 16 | Ht 63.5 in | Wt 163.0 lb

## 2016-09-01 DIAGNOSIS — E559 Vitamin D deficiency, unspecified: Secondary | ICD-10-CM | POA: Diagnosis not present

## 2016-09-01 DIAGNOSIS — I10 Essential (primary) hypertension: Secondary | ICD-10-CM | POA: Diagnosis not present

## 2016-09-01 DIAGNOSIS — Z79899 Other long term (current) drug therapy: Secondary | ICD-10-CM | POA: Diagnosis not present

## 2016-09-01 DIAGNOSIS — R7303 Prediabetes: Secondary | ICD-10-CM

## 2016-09-01 DIAGNOSIS — K219 Gastro-esophageal reflux disease without esophagitis: Secondary | ICD-10-CM | POA: Diagnosis not present

## 2016-09-01 DIAGNOSIS — E782 Mixed hyperlipidemia: Secondary | ICD-10-CM

## 2016-09-01 DIAGNOSIS — F900 Attention-deficit hyperactivity disorder, predominantly inattentive type: Secondary | ICD-10-CM | POA: Diagnosis not present

## 2016-09-01 LAB — BASIC METABOLIC PANEL WITH GFR
BUN: 40 mg/dL — ABNORMAL HIGH (ref 7–25)
CALCIUM: 9.1 mg/dL (ref 8.6–10.4)
CO2: 24 mmol/L (ref 20–31)
Chloride: 105 mmol/L (ref 98–110)
Creat: 1.2 mg/dL — ABNORMAL HIGH (ref 0.50–0.99)
GFR, EST AFRICAN AMERICAN: 55 mL/min — AB (ref >=60)
GFR, EST NON AFRICAN AMERICAN: 48 mL/min — AB (ref >=60)
Glucose, Bld: 109 mg/dL — ABNORMAL HIGH (ref 65–99)
Potassium: 5.5 mmol/L — ABNORMAL HIGH (ref 3.5–5.3)
SODIUM: 139 mmol/L (ref 135–146)

## 2016-09-01 LAB — HEPATIC FUNCTION PANEL
ALT: 15 U/L (ref 6–29)
AST: 15 U/L (ref 10–35)
Albumin: 3.7 g/dL (ref 3.6–5.1)
Alkaline Phosphatase: 56 U/L (ref 33–130)
BILIRUBIN DIRECT: 0.1 mg/dL (ref <=0.2)
BILIRUBIN INDIRECT: 0.1 mg/dL — AB (ref 0.2–1.2)
BILIRUBIN TOTAL: 0.2 mg/dL (ref 0.2–1.2)
TOTAL PROTEIN: 6.4 g/dL (ref 6.1–8.1)

## 2016-09-01 LAB — LIPID PANEL
CHOLESTEROL: 170 mg/dL (ref <200)
HDL: 65 mg/dL (ref >50)
LDL Cholesterol: 89 mg/dL (ref <100)
TRIGLYCERIDES: 82 mg/dL (ref <150)
Total CHOL/HDL Ratio: 2.6 Ratio (ref <5.0)
VLDL: 16 mg/dL (ref <30)

## 2016-09-01 LAB — CBC WITH DIFFERENTIAL/PLATELET
BASOS PCT: 1 %
Basophils Absolute: 50 cells/uL (ref 0–200)
EOS PCT: 10 %
Eosinophils Absolute: 500 cells/uL (ref 15–500)
HCT: 41.3 % (ref 35.0–45.0)
Hemoglobin: 13.4 g/dL (ref 11.7–15.5)
LYMPHS ABS: 1400 {cells}/uL (ref 850–3900)
LYMPHS PCT: 28 %
MCH: 31.2 pg (ref 27.0–33.0)
MCHC: 32.4 g/dL (ref 32.0–36.0)
MCV: 96 fL (ref 80.0–100.0)
MPV: 10 fL (ref 7.5–12.5)
Monocytes Absolute: 400 cells/uL (ref 200–950)
Monocytes Relative: 8 %
NEUTROS PCT: 53 %
Neutro Abs: 2650 cells/uL (ref 1500–7800)
Platelets: 290 10*3/uL (ref 140–400)
RBC: 4.3 MIL/uL (ref 3.80–5.10)
RDW: 14.2 % (ref 11.0–15.0)
WBC: 5 10*3/uL (ref 3.8–10.8)

## 2016-09-01 LAB — TSH: TSH: 0.94 mIU/L

## 2016-09-01 MED ORDER — RANITIDINE HCL 300 MG PO TABS: ORAL_TABLET | ORAL | 99 refills | Status: DC

## 2016-09-01 NOTE — Progress Notes (Signed)
This very nice 66 y.o. DWF presents for 6 month follow up with Hypertension, Hyperlipidemia, Pre-Diabetes and Vitamin D Deficiency. Patient also is followed for ADD- inattentive type with good clinical response with Bupropion and prn Ritalin improving her ability to focus and concentrate. Other problems include hx/o Depression compensated with the Bupropion and also Sertraline. Further she has manifest GERD & IBS - now controlled since she's retired with Pantoprazole and prn Hyoscyamine.      Patient is treated for HTN (2003) & BP has been controlled at home. Today's  . Patient has had no complaints of any cardiac type chest pain, palpitations, dyspnea/orthopnea/PND, dizziness, claudication, or dependent edema.     Hyperlipidemia is controlled with diet & meds. Patient denies myalgias or other med SE's. Last Lipids were at goal: Lab Results  Component Value Date   CHOL 168 02/13/2016   HDL 59 02/13/2016   LDLCALC 91 02/13/2016   TRIG 90 02/13/2016   CHOLHDL 2.8 02/13/2016      Also, the patient has history of PreDiabetes (A1c 5.8% in 2011 and 6.0% in  2012) and has had no symptoms of reactive hypoglycemia, diabetic polys, paresthesias or visual blurring.  Last A1c was at goal: Lab Results  Component Value Date   HGBA1C 5.2 02/13/2016      Patient also has been on Thyroid Replacement since the 1990's. Further, the patient also has history of Vitamin D Deficiency ("18" in 2008) and supplements vitamin D without any suspected side-effects. Last vitamin D was at goal:  Lab Results  Component Value Date   VD25OH 80 02/13/2016   Current Outpatient Prescriptions on File Prior to Visit  Medication Sig  . acetaminophen (TYLENOL) 500 MG tablet Take 500 mg by mouth every 6 (six) hours as needed for pain.  Marland Kitchen ALPRAZolam (XANAX) 0.5 MG tablet Take 1/2 to 1 tablet 3 x day if needed for anxiety or sleep  . atenolol (TENORMIN) 100 MG tablet TAKE 1 TABLET BY MOUTH DAILY  . buPROPion (WELLBUTRIN XL)  150 MG 24 hr tablet Take 3 tablets every morning for Mood / Depression  . calcium carbonate (TUMS - DOSED IN MG ELEMENTAL CALCIUM) 500 MG chewable tablet Chew 1 tablet by mouth as needed for indigestion. Uses Rolaids  . Cholecalciferol (VITAMIN D-3) 5000 UNITS TABS Take 5,000 Units by mouth daily.   Marland Kitchen dicyclomine (BENTYL) 10 MG capsule Take 1 capsule (10 mg total) by mouth 3 (three) times daily before meals.  . docusate sodium (COLACE) 100 MG capsule Take 100 mg by mouth 2 (two) times daily as needed for mild constipation.  Marland Kitchen levothyroxine (SYNTHROID, LEVOTHROID) 125 MCG tablet TAKE 1 TABLET BY MOUTH ONCE DAILY  . Magnesium 250 MG TABS Take 250 mg by mouth daily.  . meclizine (ANTIVERT) 25 MG tablet TAKE 1 TABLET BY MOUTH 3 TIMES DAILY AS NEEDED FOR NAUSEA  . Melatonin 3 MG TABS Take 1 tablet by mouth daily as needed (sleep).   . methylphenidate (RITALIN) 20 MG tablet 1/2 to 1 tab 3 x daily as needed for ADD or alertness  . Multiple Vitamins-Minerals (MULTIVITAMIN WITH MINERALS) tablet Take 1 tablet by mouth daily.  . ondansetron (ZOFRAN) 8 MG tablet TAKE 1 TABLET BY MOUTH 3 TIMES DAILY AS NEEDED FOR NAUSEA  . OSCIMIN SR 0.375 MG 12 hr tablet TAKE 1 TO 2 TABLETS BY MOUTH TWICE DAILY  . pantoprazole (PROTONIX) 40 MG tablet TAKE 1 TABLET BY MOUTH ONCE DAILY FOR ACID INDIGESTION AND REFLUX  .  sertraline (ZOLOFT) 100 MG tablet TAKE 1 TABLET BY MOUTH ONCE DAILY FOR MOOD AND DEPRESSION  . trimethoprim (TRIMPEX) 100 MG tablet TAKE 1 TABLET BY MOUTH TWICE DAILY   No current facility-administered medications on file prior to visit.    Allergies  Allergen Reactions  . Ace Inhibitors Other (See Comments)    Acute renal failure (lisinopril)  . Cephalexin Anaphylaxis  . Macrobid [Nitrofurantoin] Other (See Comments)    Causes hyperthermia   . Codeine Nausea Only  . Sulfa Antibiotics Rash   PMHx:   Past Medical History:  Diagnosis Date  . Diverticulosis   . Emphysema lung ([place])   . Helicobacter  pylori (H. pylori)   . History of IBS   . IBS (irritable bowel syndrome)   . Migraines   . OCD (obsessive compulsive disorder)   . Prediabetes   . Renal disorder    AKI due to lisinpril, his. pyelonephritis,   . Sepsis ([place])   . Vitamin D deficiency    Immunization History  Administered Date(s) Administered  . Influenza-Unspecified 01/31/2016  . Pneumococcal Polysaccharide-23 02/12/1999  . Td 04/27/2013  . Zoster 11/24/2005   Past Surgical History:  Procedure Laterality Date  . BREAST ENHANCEMENT SURGERY    . BUNIONECTOMY     Bil  . COLON SURGERY     colonoscopy  . HEMORRHOID SURGERY    . OOPHORECTOMY    . TONSILLECTOMY     FHx:    Reviewed / unchanged  SHx:    Reviewed / unchanged  Systems Review:  Constitutional: Denies fever, chills, wt changes, headaches, insomnia, fatigue, night sweats, change in appetite. Eyes: Denies redness, blurred vision, diplopia, discharge, itchy, watery eyes.  ENT: Denies discharge, congestion, post nasal drip, epistaxis, sore throat, earache, hearing loss, dental pain, tinnitus, vertigo, sinus pain, snoring.  CV: Denies chest pain, palpitations, irregular heartbeat, syncope, dyspnea, diaphoresis, orthopnea, PND, claudication or edema. Respiratory: denies cough, dyspnea, DOE, pleurisy, hoarseness, laryngitis, wheezing.  Gastrointestinal: Denies dysphagia, odynophagia, heartburn, reflux, water brash, abdominal pain or cramps, nausea, vomiting, bloating, diarrhea, constipation, hematemesis, melena, hematochezia  or hemorrhoids. Genitourinary: Denies dysuria, frequency, urgency, nocturia, hesitancy, discharge, hematuria or flank pain. Musculoskeletal: Denies arthralgias, myalgias, stiffness, jt. swelling, pain, limping or strain/sprain.  Skin: Denies pruritus, rash, hives, warts, acne, eczema or change in skin lesion(s). Neuro: No weakness, tremor, incoordination, spasms, paresthesia or pain. Psychiatric: Denies confusion, memory loss or sensory  loss. Endo: Denies change in weight, skin or hair change.  Heme/Lymph: No excessive bleeding, bruising or enlarged lymph nodes.  Physical Exam  There were no vitals taken for this visit.  Appears well nourished, well groomed  and in no distress.  Eyes: PERRLA, EOMs, conjunctiva no swelling or erythema. Sinuses: No frontal/maxillary tenderness ENT/Mouth: EAC's clear, TM's nl w/o erythema, bulging. Nares clear w/o erythema, swelling, exudates. Oropharynx clear without erythema or exudates. Oral hygiene is good. Tongue normal, non obstructing. Hearing intact.  Neck: Supple. Thyroid nl. Car 2+/2+ without bruits, nodes or JVD. Chest: Respirations nl with BS clear & equal w/o rales, rhonchi, wheezing or stridor.  Cor: Heart sounds normal w/ regular rate and rhythm without sig. murmurs, gallops, clicks or rubs. Peripheral pulses normal and equal  without edema.  Abdomen: Soft & bowel sounds normal. Non-tender w/o guarding, rebound, hernias, masses or organomegaly.  Lymphatics: Unremarkable.  Musculoskeletal: Full ROM all peripheral extremities, joint stability, 5/5 strength and normal gait.  Skin: Warm, dry without exposed rashes, lesions or ecchymosis apparent.  Neuro: Cranial nerves intact, reflexes  equal bilaterally. Sensory-motor testing grossly intact. Tendon reflexes grossly intact.  Pysch: Alert & oriented x 3.  Insight and judgement nl & appropriate. No ideations.  Assessment and Plan:  1. Essential hypertension  - CBC  - Continue medication, monitor blood pressure at home.  - Continue DASH diet. Reminder to go to the ER if any CP,  SOB, nausea, dizziness, severe HA, changes vision/speech,  left arm numbness and tingling and jaw pain.  Differential/Platelet - BASIC METABOLIC PANEL WITH GFR - Magnesium - TSH  2. Hyperlipidemia, mixed  - Continue diet/meds, exercise,& lifestyle modifications.  - Continue monitor periodic cholesterol/liver & renal functions   - Hepatic  function panel - Lipid panel - TSH  3. Prediabetes  - Continue diet, exercise, lifestyle modifications.  - Monitor appropriate labs.  - Hemoglobin A1c - Insulin, random  4. Vitamin D deficiency  - Continue supplementation.  - VITAMIN D 25 Hydroxy   5. ADHD, inattentive type   6. Medication management  - CBC with Differential/Platelet - BASIC METABOLIC PANEL WITH GFR - Hepatic function panel - Magnesium - Lipid panel - TSH - Hemoglobin A1c - Insulin, random - VITAMIN D 25 Hydroxy        Discussed  regular exercise, BP monitoring, weight control to achieve/maintain BMI less than 25 and discussed med and SE's. Recommended labs to assess and monitor clinical status with further disposition pending results of labs. Over 30 minutes of exam, counseling, chart review was performed.

## 2016-09-01 NOTE — Patient Instructions (Signed)

## 2016-09-02 LAB — HEMOGLOBIN A1C
Hgb A1c MFr Bld: 5.1 % (ref <5.7)
MEAN PLASMA GLUCOSE: 100 mg/dL

## 2016-09-02 LAB — INSULIN, RANDOM: Insulin: 10.7 u[IU]/mL (ref 2.0–19.6)

## 2016-09-02 LAB — MAGNESIUM: Magnesium: 2 mg/dL (ref 1.5–2.5)

## 2016-09-02 LAB — VITAMIN D 25 HYDROXY (VIT D DEFICIENCY, FRACTURES): Vit D, 25-Hydroxy: 69 ng/mL (ref 30–100)

## 2016-09-28 ENCOUNTER — Other Ambulatory Visit: Payer: Self-pay | Admitting: Internal Medicine

## 2016-09-28 MED FILL — TRIMETHOPRIM 100 MG TABLET: 100 | 45 days supply | Qty: 90 | Fill #0

## 2016-09-29 ENCOUNTER — Other Ambulatory Visit: Payer: Self-pay | Admitting: Internal Medicine

## 2016-09-29 MED FILL — BUPROPION XL 150 MG TAB: 150 | 90 days supply | Qty: 270 | Fill #0

## 2016-11-02 ENCOUNTER — Other Ambulatory Visit: Payer: Self-pay | Admitting: Internal Medicine

## 2016-11-02 MED FILL — SERTRALINE HCL 100 MG TAB: 100 | 90 days supply | Qty: 90 | Fill #0

## 2016-11-02 MED FILL — OSCIMIN SR 0.375 MG TABLET: 0.375 | 30 days supply | Qty: 120 | Fill #1

## 2016-11-16 ENCOUNTER — Other Ambulatory Visit: Payer: Self-pay | Admitting: Internal Medicine

## 2016-11-16 MED FILL — LEVOTHYROXINE 125 MCG TABLE: 125 | 90 days supply | Qty: 90 | Fill #0

## 2016-11-16 MED FILL — TRIMETHOPRIM 100 MG TABLET: 100 | 45 days supply | Qty: 90 | Fill #1

## 2016-11-16 MED FILL — raNITIdine HCL 300 MG TABS: 300 | 90 days supply | Qty: 180 | Fill #0

## 2016-11-16 MED FILL — ATENOLOL 100 MG TABLET: 100 | 90 days supply | Qty: 90 | Fill #1

## 2016-11-19 MED FILL — PENICILLIN VK 500 MG TABLET: 500 | 7 days supply | Qty: 28 | Fill #0

## 2016-11-19 MED FILL — HYDROCODON-APAP 5-325: 5-325 | 3 days supply | Qty: 14 | Fill #0

## 2016-12-09 ENCOUNTER — Encounter (HOSPITAL_COMMUNITY): Payer: Self-pay | Admitting: Emergency Medicine

## 2016-12-09 ENCOUNTER — Ambulatory Visit (HOSPITAL_COMMUNITY)
Admission: EM | Admit: 2016-12-09 | Discharge: 2016-12-09 | Disposition: A | Discharge status: Home / Self Care | Payer: PPO | Attending: Family Medicine | Admitting: Family Medicine

## 2016-12-09 DIAGNOSIS — S60561A Insect bite (nonvenomous) of right hand, initial encounter: Secondary | ICD-10-CM | POA: Diagnosis not present

## 2016-12-09 DIAGNOSIS — W57XXXA Bitten or stung by nonvenomous insect and other nonvenomous arthropods, initial encounter: Secondary | ICD-10-CM

## 2016-12-09 DIAGNOSIS — L03119 Cellulitis of unspecified part of limb: Secondary | ICD-10-CM | POA: Diagnosis not present

## 2016-12-09 MED ORDER — METHYLPREDNISOLONE ACETATE 80 MG/ML IJ SUSP
80.0000 mg | Freq: Once | INTRAMUSCULAR | Status: AC
  Administered 2016-12-09: 80 mg via INTRAMUSCULAR

## 2016-12-09 MED ORDER — DOXYCYCLINE HYCLATE 100 MG PO TABS: 100.0000 mg | ORAL_TABLET | Freq: Two times a day (BID) | ORAL | 0 refills | Status: DC

## 2016-12-09 MED ORDER — METHYLPREDNISOLONE ACETATE 80 MG/ML IJ SUSP
INTRAMUSCULAR | Status: AC
  Filled 2016-12-09: qty 1

## 2016-12-09 MED FILL — DOXYCYCLINE HYC 100 MG TAB: 100 | 10 days supply | Qty: 20 | Fill #0

## 2016-12-09 NOTE — ED Provider Notes (Signed)
[name]    CSN: [id number] Arrival date & time: 12/09/16  1324     History   Chief Complaint Chief Complaint  Patient presents with  . Insect Bite    HPI [name] is a 66 y.o. female.   PT was stung on right hand by an insect yesterday while she was trimming a bush. PT thinks she was stung twice. Affected hand is red, swollen, and itchy. PT reports her lips were numb yesterday, but that has resolved.   She's had no shortness of breath. 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      Past Medical History:  Diagnosis Date  . Diverticulosis   . Emphysema lung ([place])   . Helicobacter pylori (H. pylori)   . History of IBS   . IBS (irritable bowel syndrome)   . Migraines   . OCD (obsessive compulsive disorder)   . Prediabetes   . Renal disorder    AKI due to lisinpril, his. pyelonephritis,   . Sepsis ([place])   . Vitamin D deficiency     Patient Active Problem List   Diagnosis Date Noted  . Thrombocytopenia ([place]) 06/19/2015  . Lower urinary tract infectious disease 06/16/2015  . CKD Stage 3 (GFR 55 ml/min) 01/10/2015  . IBS  01/10/2015  . Mixed hyperlipidemia 06/12/2014  . Medication management 06/12/2014  . LBBB (left bundle branch block) 07/24/2013  . HTN (hypertension) 07/24/2013  . Depression, controlled   . Diverticulosis   . ADHD (attention deficit hyperactivity disorder)   . OCD (obsessive compulsive disorder)   . Prediabetes   . Migraines   . Vitamin D deficiency   . Hypothyroidism 04/05/2013    Past Surgical History:  Procedure Laterality Date  . BREAST ENHANCEMENT SURGERY    . BUNIONECTOMY     Bil  . COLON SURGERY     colonoscopy  . HEMORRHOID SURGERY    . OOPHORECTOMY    . TONSILLECTOMY      OB History    No data available       Home Medications    Prior to Admission medications   Medication Sig Start Date End Date Taking? Authorizing Provider  acetaminophen (TYLENOL) 500 MG tablet Take 500 mg by mouth  every 6 (six) hours as needed for pain.    [provider]  ALPRAZolam Duanne Moron) 0.5 MG tablet Take 1/2 to 1 tablet 3 x day if needed for anxiety or sleep 04/15/14   Unk [name], MD  atenolol (TENORMIN) 100 MG tablet TAKE 1 TABLET BY MOUTH DAILY 08/20/16   Unk [name], MD  buPROPion (WELLBUTRIN XL) 150 MG 24 hr tablet TAKE 3 TABLETS BY MOUTH EVERY MORNING FOR MOOD/DEPRESSION 09/29/16   Unk [name], MD  calcium carbonate (TUMS - DOSED IN MG ELEMENTAL CALCIUM) 500 MG chewable tablet Chew 1 tablet by mouth as needed for indigestion. Uses Rolaids    [provider]  Cholecalciferol (VITAMIN D-3) 5000 UNITS TABS Take 5,000 Units by mouth daily.     [provider]  dicyclomine (BENTYL) 10 MG capsule  07/29/16   [provider]  docusate sodium (COLACE) 100 MG capsule Take 100 mg by mouth 2 (two) times daily as needed for mild constipation.    [provider]  doxycycline (VIBRA-TABS) 100 MG tablet Take 1 tablet (100 mg total) by mouth 2 (two) times daily. 12/09/16   Robyn Haber, MD  hyoscyamine (LEVSIN SL) 0.125 MG SL tablet Place 0.125 mg under the  tongue every 4 (four) hours as needed.    [provider]  levothyroxine (SYNTHROID, LEVOTHROID) 125 MCG tablet TAKE 1 TABLET BY MOUTH ONCE DAILY 11/16/16   Unk [name], MD  Magnesium 250 MG TABS Take 250 mg by mouth daily.    [provider]  meclizine (ANTIVERT) 25 MG tablet TAKE 1 TABLET BY MOUTH 3 TIMES DAILY AS NEEDED FOR NAUSEA 09/10/15   Unk [name], MD  Melatonin 3 MG TABS Take 1 tablet by mouth daily as needed (sleep).     [provider]  methylphenidate (RITALIN) 20 MG tablet 1/2 to 1 tab 3 x daily as needed for ADD or alertness 05/27/16 08/18/16  Forcucci, Loma Sousa, PA-C  Multiple Vitamins-Minerals (MULTIVITAMIN WITH MINERALS) tablet Take 1 tablet by mouth daily.    [provider]  ondansetron (ZOFRAN) 8 MG tablet TAKE 1 TABLET BY MOUTH 3 TIMES DAILY  AS NEEDED FOR NAUSEA 06/22/16   Unk [name], MD  OSCIMIN SR 0.375 MG 12 hr tablet TAKE 1 TO 2 TABLETS BY MOUTH TWICE DAILY 08/04/16   Unk [name], MD  pantoprazole (PROTONIX) 40 MG tablet TAKE 1 TABLET BY MOUTH ONCE DAILY FOR ACID INDIGESTION AND REFLUX 05/27/16   Forcucci, Loma Sousa, PA-C  ranitidine (ZANTAC) 300 MG tablet Take 1 to 2 tablets daily for heartburn & reflux to allow wean and transition from PPI / pantoprazole 09/01/16   Unk [name], MD  sertraline (ZOLOFT) 100 MG tablet TAKE 1 TABLET BY MOUTH ONCE DAILY FOR MOOD AND DEPRESSION 11/02/16   Vicie Mutters, PA-C  trimethoprim (TRIMPEX) 100 MG tablet TAKE 1 TABLET BY MOUTH TWICE DAILY 09/28/16   Vicie Mutters, PA-C    Family History Family History  Problem Relation Age of Onset  . Colon polyps Mother   . Colon cancer Neg Hx     Social History Social History  Substance Use Topics  . Smoking status: Former Smoker    Packs/day: 1.00    Years: 40.00    Types: Cigarettes    Quit date: 04/27/2002  . Smokeless tobacco: Never Used  . Alcohol use No     Comment: ocassional     Allergies   Ace inhibitors; Cephalexin; Macrobid [nitrofurantoin]; and Codeine   Review of Systems Review of Systems  Musculoskeletal: Positive for joint swelling.  Skin: Positive for rash.  All other systems reviewed and are negative.    Physical Exam Triage Vital Signs ED Triage Vitals  Enc Vitals Group     BP 12/09/16 1426 (!) 100/53     Pulse Rate 12/09/16 1426 (!) 56     Resp 12/09/16 1426 16     Temp 12/09/16 1426 98 F (36.7 C)     Temp src --      SpO2 12/09/16 1426 96 %     Weight 12/09/16 1425 162 lb (73.5 kg)     Height 12/09/16 1425 5\' 4"  (1.626 m)     Head Circumference --      Peak Flow --      Pain Score 12/09/16 1425 0     Pain Loc --      Pain Edu? --      Excl. in [place]? --    No data found.   Updated Vital Signs BP (!) 100/53   Pulse (!) 56   Temp 98 F (36.7 C)   Resp 16   Ht 5\' 4"  (1.626 m)   Wt 162  lb (73.5 kg)   SpO2 96%   BMI 27.81  kg/m    Physical Exam  Constitutional: She is oriented to person, place, and time. She appears well-developed and well-nourished.  HENT:  Right Ear: External ear normal.  Left Ear: External ear normal.  Mouth/Throat: Oropharynx is clear and moist.  Eyes: Pupils are equal, round, and reactive to light. Conjunctivae are normal.  Neck: Normal range of motion. Neck supple.  Pulmonary/Chest: Effort normal.  Musculoskeletal: Normal range of motion.  Neurological: She is alert and oriented to person, place, and time.  Skin: Skin is warm.  Dorsal hand and wrist of the Right upper extremity are diffusely erythematous. There is some erythema on the volar surface of the right wrist as well.  Nursing note and vitals reviewed.    UC Treatments / Results  Labs (all labs ordered are listed, but only abnormal results are displayed) Labs Reviewed - No data to display  EKG  EKG Interpretation None       Radiology No results found.  Procedures Procedures (including critical care time)  Medications Ordered in UC Medications  methylPREDNISolone acetate (DEPO-MEDROL) injection 80 mg (not administered)     Initial Impression / Assessment and Plan / UC Course  I have reviewed the triage vital signs and the nursing notes.  Pertinent labs & imaging results that were available during my care of the patient were reviewed by me and considered in my medical decision making (see chart for details).     Final Clinical Impressions(s) / UC Diagnoses   Final diagnoses:  Insect bite, initial encounter  Cellulitis of upper extremity, unspecified laterality    New Prescriptions New Prescriptions   DOXYCYCLINE (VIBRA-TABS) 100 MG TABLET    Take 1 tablet (100 mg total) by mouth 2 (two) times daily.     Controlled Substance Prescriptions [place] Controlled Substance Registry consulted? Not Applicable   Robyn Haber, MD 12/09/16 1436

## 2016-12-09 NOTE — ED Triage Notes (Signed)
PT was stung on right hand by an insect yesterday while she was trimming a bush. PT thinks she was stung twice. Affected hand is red, swollen, and itchy. PT reports her lips were numb yesterday, but that has resolved.

## 2016-12-10 ENCOUNTER — Ambulatory Visit (INDEPENDENT_AMBULATORY_CARE_PROVIDER_SITE_OTHER): Payer: PPO | Admitting: Internal Medicine

## 2016-12-10 VITALS — BP 124/86 | HR 75 | Temp 97.5°F | Resp 14 | Ht 63.5 in | Wt 164.8 lb

## 2016-12-10 DIAGNOSIS — IMO0001 Reserved for inherently not codable concepts without codable children: Secondary | ICD-10-CM

## 2016-12-10 DIAGNOSIS — M7989 Other specified soft tissue disorders: Secondary | ICD-10-CM

## 2016-12-10 DIAGNOSIS — M79641 Pain in right hand: Secondary | ICD-10-CM | POA: Diagnosis not present

## 2016-12-10 DIAGNOSIS — T63484D Toxic effect of venom of other arthropod, undetermined, subsequent encounter: Secondary | ICD-10-CM | POA: Diagnosis not present

## 2016-12-10 MED ORDER — PREDNISONE 20 MG PO TABS: ORAL_TABLET | ORAL | 2 refills | Status: DC

## 2016-12-10 MED FILL — predniSONE 20 MG TABS: 20 | 11 days supply | Qty: 20 | Fill #0

## 2016-12-12 ENCOUNTER — Encounter: Payer: Self-pay | Admitting: Internal Medicine

## 2016-12-12 NOTE — Progress Notes (Signed)
Subjective:    Patient ID: [name], female    DOB: Sep 05, 1950, 66 y.o.   MRN: [id number]  HPI  This nice 66 yo DWF had an insect sting  To the dorsal Rt hand 2 days ago and went to an Urgent care and was given a steroid injection. Yesterday she went to [facility] and was suspected to have cellulitis and was given Doxycycline. She presents today for evaluation of her swollen Rt hand.   Medication Sig  . acetaminophen  500 MG tablet Take 500 mg by mouth every 6 (six) hours as needed for pain.  Marland Kitchen ALPRAZolam 0.5 MG tablet Take 1/2 to 1 tablet 3 x day if needed for anxiety or sleep  . atenolol100 MG tablet TAKE 1 TABLET BY MOUTH DAILY  . buPROPion-XL 150 MG  TAKE 3 TAB EVERY MORNING FOR MOOD/DEPRESSION  . TUMS 500 mg Chew 1 tablet by mouth as needed for indigestion. Uses Rolaids  . VITAMIN D- 5000 UNITS  Take 5,000 Units by mouth daily.   Marland Kitchen COLACE 100 MG capsule Take 100 mg by mouth 2 (two) times daily as needed for mild constipation.  Marland Kitchen doxycycline  100 MG tablet Take 1 tablet (100 mg total) by mouth 2 (two) times daily.  Marland Kitchen LEVSIN SL 0.125 MG SL Place 0.125 mg under the tongue every 4 (four) hours as needed.  Marland Kitchen levothyroxine  125 MCG tablet TAKE 1 TABLET BY MOUTH ONCE DAILY  . Magnesium 250 MG TABS Take 250 mg by mouth daily.  . meclizine  25 MG tablet TAKE 1 TABLET BY MOUTH 3 TIMES DAILY AS NEEDED FOR NAUSEA  . Multiple Vitamins-Minerals  Take 1 tablet by mouth daily.  . ondansetron  8 MG tablet TAKE 1 TABLET BY MOUTH 3 TIMES DAILY AS NEEDED FOR NAUSEA  . OSCIMIN SR 0.375 MG 12 hr t TAKE 1 TO 2 TABLETS BY MOUTH TWICE DAILY  . ranitidine  300 MG tablet Take 1 to 2 tablets daily for heartburn   . sertraline  100 MG tablet TAKE 1 TAB ONCE DAILY FOR MOOD AND DEPRESSION  . trimethoprim  100 MG tablet TAKE 1 TABLET BY MOUTH TWICE DAILY  . dicyclomine10 MG capsule   . Melatonin 3 MG TABS Take 1 tablet by mouth daily as needed (sleep).   Marland Kitchen RITALIN 20 MG tablet 1/2 to 1 tab 3 x daily as needed for  ADD or alertness  . pantoprazole 40 MG tablet TAKE 1 TAB ONCE DAILY    Allergies  Allergen Reactions  . Ace Inhibitors Other (See Comments)    Acute renal failure (lisinopril)  . Cephalexin Anaphylaxis  . Macrobid [Nitrofurantoin] Other (See Comments)    Causes hyperthermia   . Codeine Nausea Only   Past Medical History:  Diagnosis Date  . Diverticulosis   . Emphysema lung ([place])   . Helicobacter pylori (H. pylori)   . History of IBS   . IBS (irritable bowel syndrome)   . Migraines   . OCD (obsessive compulsive disorder)   . Prediabetes   . Renal disorder    AKI due to lisinpril, his. pyelonephritis,   . Sepsis ([place])   . Vitamin D deficiency    Past Surgical History:  Procedure Laterality Date  . BREAST ENHANCEMENT SURGERY    . BUNIONECTOMY     Bil  . COLON SURGERY     colonoscopy  . HEMORRHOID SURGERY    . OOPHORECTOMY    . TONSILLECTOMY  Review of Systems  10 point systems review negative except as above.    Objective:   Physical Exam  BP 124/86   Pulse 75   Temp (!) 97.5 F (36.4 C)   Resp 14   Ht 5' 3.5" (1.613 m)   Wt 164 lb 12.8 oz (74.8 kg)   BMI 28.74 kg/m   HEENT - WNL. Neck - supple.  Chest - Clear equal BS. Cor - Nl HS. RRR w/o sig MGR. PP 1(+). No edema. MS- FROM w/o deformities.  Gait Nl. Neuro -  Nl w/o focal abnormalities. Skin - Erythematous STS over the dorsal Rt hand /wris w/o lymphangitic streaking      Assessment & Plan:   1. Hymenoptera sting, undetermined intent, subsequent encounter  - predniSONE (DELTASONE) 20 MG tablet; 1 tab 3 x day for 3 days, then 1 tab 2 x day for 3 days, then 1 tab 1 x day for 5 days  Dispense: 20 tablet; Refill: 2  - Advised to stop Doxycycline as I doubt infection

## 2016-12-17 ENCOUNTER — Ambulatory Visit: Payer: Self-pay | Admitting: Physician Assistant

## 2016-12-22 ENCOUNTER — Ambulatory Visit (INDEPENDENT_AMBULATORY_CARE_PROVIDER_SITE_OTHER): Payer: PPO | Admitting: Internal Medicine

## 2016-12-22 ENCOUNTER — Encounter: Payer: Self-pay | Admitting: Internal Medicine

## 2016-12-22 VITALS — BP 136/90 | HR 68 | Temp 97.3°F | Ht 63.5 in | Wt 163.0 lb

## 2016-12-22 DIAGNOSIS — Z79899 Other long term (current) drug therapy: Secondary | ICD-10-CM | POA: Diagnosis not present

## 2016-12-22 DIAGNOSIS — S0083XA Contusion of other part of head, initial encounter: Secondary | ICD-10-CM

## 2016-12-22 DIAGNOSIS — E782 Mixed hyperlipidemia: Secondary | ICD-10-CM

## 2016-12-22 DIAGNOSIS — I10 Essential (primary) hypertension: Secondary | ICD-10-CM

## 2016-12-22 DIAGNOSIS — E039 Hypothyroidism, unspecified: Secondary | ICD-10-CM | POA: Diagnosis not present

## 2016-12-22 DIAGNOSIS — R7303 Prediabetes: Secondary | ICD-10-CM

## 2016-12-22 DIAGNOSIS — E559 Vitamin D deficiency, unspecified: Secondary | ICD-10-CM

## 2016-12-22 DIAGNOSIS — K219 Gastro-esophageal reflux disease without esophagitis: Secondary | ICD-10-CM

## 2016-12-22 MED ORDER — PANTOPRAZOLE SODIUM 40 MG PO TBEC: 40.0000 mg | DELAYED_RELEASE_TABLET | Freq: Every day | ORAL | 1 refills | Status: DC

## 2016-12-22 MED ORDER — ONDANSETRON HCL 8 MG PO TABS: ORAL_TABLET | ORAL | 2 refills | Status: DC

## 2016-12-22 NOTE — Progress Notes (Signed)
This very nice 66 y.o. DWF presents for 3 month follow up with Hypertension, Hyperlipidemia, Pre-Diabetes and Vitamin D Deficiency. Patient is on Protonix and prn Hyoscyamine for GERD and IBS respectively which are in apparent control. Patient also reports a fall 5 days ago in her yard picking up large limbs and tripped sustaining a contusion to the forehead, nose and cheeks. She denies presyncopal sx's and no LOC.      Patient is treated for HTN since 2003 & reports random home BP have been normal. Today's BP is borderline high normal at 136/90. Patient has had no complaints of any cardiac type chest pain, palpitations, dyspnea/orthopnea/PND, dizziness, claudication, or dependent edema.     Hyperlipidemia is controlled with diet & meds. Patient denies myalgias or other med SE's. Last Lipids were at goal:  Lab Results  Component Value Date   CHOL 170 09/01/2016   HDL 65 09/01/2016   LDLCALC 89 09/01/2016   TRIG 82 09/01/2016   CHOLHDL 2.6 09/01/2016      Also, the patient has history of PreDiabetes in 2011 of A1c 5.8%  and has had no symptoms of reactive hypoglycemia, diabetic polys, paresthesias or visual blurring.  Last A1c was at goal: Lab Results  Component Value Date   HGBA1C 5.1 09/01/2016      Patient was dx'd Hypothyroid in the 1990's and has since been on replacement. Further, the patient also has history of Vitamin D Deficiency in 20089 of "18" and supplements vitamin D without any suspected side-effects. Last vitamin D was at goal:  Lab Results  Component Value Date   VD25OH 69 09/01/2016   Current Outpatient Prescriptions on File Prior to Visit  Medication Sig  . acetaminophen (TYLENOL) 500 MG tablet Take 500 mg by mouth every 6 (six) hours as needed for pain.  Marland Kitchen ALPRAZolam (XANAX) 0.5 MG tablet Take 1/2 to 1 tablet 3 x day if needed for anxiety or sleep  . Ascorbic Acid (VITAMIN C PO) Take 1 tablet by mouth daily.  Marland Kitchen atenolol (TENORMIN) 100 MG tablet TAKE 1 TABLET BY  MOUTH DAILY  . buPROPion (WELLBUTRIN XL) 150 MG 24 hr tablet TAKE 3 TABLETS BY MOUTH EVERY MORNING FOR MOOD/DEPRESSION  . calcium carbonate (TUMS - DOSED IN MG ELEMENTAL CALCIUM) 500 MG chewable tablet Chew 1 tablet by mouth as needed for indigestion. Uses Rolaids  . Cholecalciferol (VITAMIN D-3) 5000 UNITS TABS Take 5,000 Units by mouth daily.   Marland Kitchen docusate sodium (COLACE) 100 MG capsule Take 100 mg by mouth 2 (two) times daily as needed for mild constipation.  . hyoscyamine (LEVSIN SL) 0.125 MG SL tablet Place 0.125 mg under the tongue every 4 (four) hours as needed.  Marland Kitchen levothyroxine (SYNTHROID, LEVOTHROID) 125 MCG tablet TAKE 1 TABLET BY MOUTH ONCE DAILY  . Magnesium 250 MG TABS Take 250 mg by mouth daily.  . meclizine (ANTIVERT) 25 MG tablet TAKE 1 TABLET BY MOUTH 3 TIMES DAILY AS NEEDED FOR NAUSEA  . Multiple Vitamins-Minerals (MULTIVITAMIN WITH MINERALS) tablet Take 1 tablet by mouth daily.  . OSCIMIN SR 0.375 MG 12 hr tablet TAKE 1 TO 2 TABLETS BY MOUTH TWICE DAILY  . ranitidine (ZANTAC) 300 MG tablet Take 1 to 2 tablets daily for heartburn & reflux to allow wean and transition from PPI / pantoprazole  . sertraline (ZOLOFT) 100 MG tablet TAKE 1 TABLET BY MOUTH ONCE DAILY FOR MOOD AND DEPRESSION  . trimethoprim (TRIMPEX) 100 MG tablet TAKE 1 TABLET BY MOUTH  TWICE DAILY   No current facility-administered medications on file prior to visit.    Allergies  Allergen Reactions  . Ace Inhibitors Other (See Comments)    Acute renal failure (lisinopril)  . Cephalexin Anaphylaxis  . Macrobid [Nitrofurantoin] Other (See Comments)    Causes hyperthermia   . Codeine Nausea Only   PMHx:   Past Medical History:  Diagnosis Date  . Diverticulosis   . Emphysema lung ([place])   . Helicobacter pylori (H. pylori)   . History of IBS   . IBS (irritable bowel syndrome)   . Migraines   . OCD (obsessive compulsive disorder)   . Prediabetes   . Renal disorder    AKI due to lisinpril, his.  pyelonephritis,   . Sepsis ([place])   . Vitamin D deficiency    Immunization History  Administered Date(s) Administered  . Influenza-Unspecified 01/31/2016  . Pneumococcal Polysaccharide-23 02/12/1999  . Td 04/27/2013  . Zoster 11/24/2005   Past Surgical History:  Procedure Laterality Date  . BREAST ENHANCEMENT SURGERY    . BUNIONECTOMY     Bil  . COLON SURGERY     colonoscopy  . HEMORRHOID SURGERY    . OOPHORECTOMY    . TONSILLECTOMY     FHx:    Reviewed / unchanged  SHx:    Reviewed / unchanged  Systems Review:  Constitutional: Denies fever, chills, wt changes, headaches, insomnia, fatigue, night sweats, change in appetite. Eyes: Denies redness, blurred vision, diplopia, discharge, itchy, watery eyes.  ENT: Denies discharge, congestion, post nasal drip, epistaxis, sore throat, earache, hearing loss, dental pain, tinnitus, vertigo, sinus pain, snoring.  CV: Denies chest pain, palpitations, irregular heartbeat, syncope, dyspnea, diaphoresis, orthopnea, PND, claudication or edema. Respiratory: denies cough, dyspnea, DOE, pleurisy, hoarseness, laryngitis, wheezing.  Gastrointestinal: Denies dysphagia, odynophagia, heartburn, reflux, water brash, abdominal pain or cramps, nausea, vomiting, bloating, diarrhea, constipation, hematemesis, melena, hematochezia  or hemorrhoids. Genitourinary: Denies dysuria, frequency, urgency, nocturia, hesitancy, discharge, hematuria or flank pain. Musculoskeletal: Denies arthralgias, myalgias, stiffness, jt. swelling, pain, limping or strain/sprain.  Skin: Denies pruritus, rash, hives, warts, acne, eczema or change in skin lesion(s). Neuro: No weakness, tremor, incoordination, spasms, paresthesia or pain. Psychiatric: Denies confusion, memory loss or sensory loss. Endo: Denies change in weight, skin or hair change.  Heme/Lymph: No excessive bleeding, bruising or enlarged lymph nodes.  Physical Exam  BP 136/90   Pulse 68   Temp (!) 97.3 F (36.3  C)   Ht 5' 3.5" (1.613 m)   Wt 163 lb (73.9 kg)   SpO2 97%   BMI 28.42 kg/m   Appears well nourished, well groomed  and in no distress.  Eyes: PERRLA, EOMs, conjunctiva no swelling or erythema. Sinuses: No frontal/maxillary tenderness ENT/Mouth: EAC's clear, TM's nl w/o erythema, bulging. Nares clear w/o erythema, swelling, exudates. Oropharynx clear without erythema or exudates. Oral hygiene is good. Tongue normal, non obstructing. Hearing intact.  Neck: Supple. Thyroid nl. Car 2+/2+ without bruits, nodes or JVD. Chest: Respirations nl with BS clear & equal w/o rales, rhonchi, wheezing or stridor.  Cor: Heart sounds normal w/ regular rate and rhythm without sig. murmurs, gallops, clicks or rubs. Peripheral pulses normal and equal  without edema.  Abdomen: Soft & bowel sounds normal. Non-tender w/o guarding, rebound, hernias, masses or organomegaly.  Lymphatics: Unremarkable.  Musculoskeletal: Full ROM all peripheral extremities, joint stability, 5/5 strength and normal gait.  Skin: Warm, dry without exposed rashes, lesions , but with greenish purple yellowish  ecchymosis over the forehead, nasal  bridge and inferior orbital areas .  Neuro: Cranial nerves intact, reflexes equal bilaterally. Sensory-motor testing grossly intact. Tendon reflexes grossly intact.  Pysch: Alert & oriented x 3.  Insight and judgement nl & appropriate. No ideations.  Assessment and Plan:   1. Essential hypertension  - Continue medication, monitor blood pressure at home.  - Continue DASH diet. Reminder to go to the ER if any CP,  SOB, nausea, dizziness, severe HA, changes vision/speech.  - CBC with Differential/Platelet - BASIC METABOLIC PANEL WITH GFR - Magnesium - Lipid panel - TSH  2. Hyperlipidemia, mixed  - Continue diet/meds, exercise,& lifestyle modifications.  - Continue monitor periodic cholesterol/liver & renal functions  - Hepatic function panel - Lipid panel  3. Prediabetes  -  Continue diet, exercise, lifestyle modifications.  - Monitor appropriate labs.  - Hemoglobin A1c - Insulin, fasting  4. Vitamin D deficiency  - Continue supplementation  - VITAMIN D 25 Hydroxy   5. Gastroesophageal reflux disease   - ondansetron (ZOFRAN) 8 MG tablet; TAKE 1 TABLET 3 x  DAILY AS NEEDED FOR NAUSEA  Dispense: 60 tablet; Refill: 2 - pantoprazole (PROTONIX) 40 MG tablet; Take 1 tablet (40 mg total) by mouth daily.  Dispense: 90 tablet; Refill: 1  - CBC with Differential/Platelet  6. Hypothyroidism  - TSH  7. Facial contusion   8. Medication management  - CBC with Differential/Platelet - BASIC METABOLIC PANEL WITH GFR - Hepatic function panel - Magnesium - Lipid panel - Hemoglobin A1c - Insulin, fasting - VITAMIN D 25 Hydroxy  - TSH       Discussed  regular exercise, BP monitoring, weight control to achieve/maintain BMI less than 25 and discussed med and SE's. Recommended labs to assess and monitor clinical status with further disposition pending results of labs. Over 30 minutes of exam, counseling, chart review was performed.

## 2016-12-22 NOTE — Patient Instructions (Signed)

## 2016-12-23 ENCOUNTER — Other Ambulatory Visit: Payer: Self-pay | Admitting: Internal Medicine

## 2016-12-23 DIAGNOSIS — N289 Disorder of kidney and ureter, unspecified: Secondary | ICD-10-CM

## 2016-12-24 LAB — HEPATIC FUNCTION PANEL
AG RATIO: 1.3 (calc) (ref 1.0–2.5)
ALKALINE PHOSPHATASE (APISO): 65 U/L (ref 33–130)
ALT: 17 U/L (ref 6–29)
AST: 17 U/L (ref 10–35)
Albumin: 3.7 g/dL (ref 3.6–5.1)
BILIRUBIN INDIRECT: 0.4 mg/dL (ref 0.2–1.2)
BILIRUBIN TOTAL: 0.5 mg/dL (ref 0.2–1.2)
Bilirubin, Direct: 0.1 mg/dL (ref 0.0–0.2)
GLOBULIN: 2.8 g/dL (ref 1.9–3.7)
Total Protein: 6.5 g/dL (ref 6.1–8.1)

## 2016-12-24 LAB — BASIC METABOLIC PANEL WITH GFR
BUN / CREAT RATIO: 14 (calc) (ref 6–22)
BUN: 23 mg/dL (ref 7–25)
CO2: 31 mmol/L (ref 20–32)
CREATININE: 1.66 mg/dL — AB (ref 0.50–0.99)
Calcium: 9.7 mg/dL (ref 8.6–10.4)
Chloride: 99 mmol/L (ref 98–110)
GFR, EST NON AFRICAN AMERICAN: 32 mL/min/{1.73_m2} — AB (ref >=60)
GFR, Est African American: 37 mL/min/{1.73_m2} — ABNORMAL LOW (ref >=60)
GLUCOSE: 94 mg/dL (ref 65–99)
Potassium: 5 mmol/L (ref 3.5–5.3)
SODIUM: 138 mmol/L (ref 135–146)

## 2016-12-24 LAB — HEMOGLOBIN A1C
EAG (MMOL/L): 6 (calc)
Hgb A1c MFr Bld: 5.4 % of total Hgb (ref <5.7)
MEAN PLASMA GLUCOSE: 108 (calc)

## 2016-12-24 LAB — CBC WITH DIFFERENTIAL/PLATELET
BASOS ABS: 47 {cells}/uL (ref 0–200)
Basophils Relative: 0.6 %
EOS ABS: 379 {cells}/uL (ref 15–500)
Eosinophils Relative: 4.8 %
HEMATOCRIT: 42.7 % (ref 35.0–45.0)
HEMOGLOBIN: 14.4 g/dL (ref 11.7–15.5)
LYMPHS ABS: 1849 {cells}/uL (ref 850–3900)
MCH: 31.6 pg (ref 27.0–33.0)
MCHC: 33.7 g/dL (ref 32.0–36.0)
MCV: 93.6 fL (ref 80.0–100.0)
MPV: 9.9 fL (ref 7.5–12.5)
Monocytes Relative: 6.9 %
NEUTROS ABS: 5080 {cells}/uL (ref 1500–7800)
NEUTROS PCT: 64.3 %
Platelets: 328 10*3/uL (ref 140–400)
RBC: 4.56 10*6/uL (ref 3.80–5.10)
RDW: 12.8 % (ref 11.0–15.0)
Total Lymphocyte: 23.4 %
WBC: 7.9 10*3/uL (ref 3.8–10.8)
WBCMIX: 545 {cells}/uL (ref 200–950)

## 2016-12-24 LAB — MAGNESIUM: Magnesium: 2.2 mg/dL (ref 1.5–2.5)

## 2016-12-24 LAB — VITAMIN D 25 HYDROXY (VIT D DEFICIENCY, FRACTURES): Vit D, 25-Hydroxy: 60 ng/mL (ref 30–100)

## 2016-12-24 LAB — LIPID PANEL
CHOL/HDL RATIO: 2.8 (calc) (ref <5.0)
Cholesterol: 212 mg/dL — ABNORMAL HIGH (ref <200)
HDL: 76 mg/dL (ref >50)
LDL CHOLESTEROL (CALC): 109 mg/dL — AB
NON-HDL CHOLESTEROL (CALC): 136 mg/dL — AB (ref <130)
Triglycerides: 155 mg/dL — ABNORMAL HIGH (ref <150)

## 2016-12-24 LAB — TSH: TSH: 0.62 mIU/L (ref 0.40–4.50)

## 2016-12-24 LAB — INSULIN, FASTING: Insulin: 4.5 u[IU]/mL (ref 2.0–19.6)

## 2016-12-24 MED FILL — ONDANSETRON HCL 8 MG TAB: 8 | 20 days supply | Qty: 60 | Fill #0

## 2016-12-24 MED FILL — PANTOPRAZOLE SOD DR 40 MG T: 40 | 90 days supply | Qty: 90 | Fill #0

## 2017-01-07 ENCOUNTER — Other Ambulatory Visit: Payer: Self-pay | Admitting: Physician Assistant

## 2017-01-07 MED FILL — TRIMETHOPRIM 100 MG TABLET: 100 | 45 days supply | Qty: 90 | Fill #0

## 2017-01-21 DIAGNOSIS — M25511 Pain in right shoulder: Secondary | ICD-10-CM | POA: Diagnosis not present

## 2017-01-21 DIAGNOSIS — M545 Low back pain: Secondary | ICD-10-CM | POA: Diagnosis not present

## 2017-01-21 DIAGNOSIS — M542 Cervicalgia: Secondary | ICD-10-CM | POA: Diagnosis not present

## 2017-02-02 MED FILL — SERTRALINE HCL 100 MG TAB: 100 | 90 days supply | Qty: 90 | Fill #1

## 2017-02-15 MED FILL — BUPROPION HCL XL 150 MG TAB: 150 | 90 days supply | Qty: 270 | Fill #1

## 2017-02-17 MED FILL — TRIMETHOPRIM 100 MG TABLET: 100 | 45 days supply | Qty: 90 | Fill #1

## 2017-02-26 ENCOUNTER — Other Ambulatory Visit: Payer: Self-pay | Admitting: Internal Medicine

## 2017-02-26 MED FILL — LEVOTHYROXINE 125 MCG TABLE: 125 | 90 days supply | Qty: 90 | Fill #1

## 2017-02-26 MED FILL — ATENOLOL 100 MG TABLET: 100 | 90 days supply | Qty: 90 | Fill #0

## 2017-02-26 MED FILL — HYOSCYAMINE ER 0.375 MG TAB: 0.375 | 30 days supply | Qty: 120 | Fill #0

## 2017-03-01 ENCOUNTER — Other Ambulatory Visit: Payer: Self-pay | Admitting: Internal Medicine

## 2017-03-08 ENCOUNTER — Other Ambulatory Visit: Payer: Self-pay | Admitting: Internal Medicine

## 2017-03-22 ENCOUNTER — Ambulatory Visit: Payer: PPO | Admitting: Internal Medicine

## 2017-03-22 ENCOUNTER — Encounter: Payer: Self-pay | Admitting: Internal Medicine

## 2017-03-22 VITALS — BP 118/80 | HR 68 | Temp 97.0°F | Resp 16 | Ht 63.0 in | Wt 160.4 lb

## 2017-03-22 DIAGNOSIS — Z136 Encounter for screening for cardiovascular disorders: Secondary | ICD-10-CM | POA: Diagnosis not present

## 2017-03-22 DIAGNOSIS — Z0000 Encounter for general adult medical examination without abnormal findings: Secondary | ICD-10-CM

## 2017-03-22 DIAGNOSIS — E559 Vitamin D deficiency, unspecified: Secondary | ICD-10-CM

## 2017-03-22 DIAGNOSIS — E039 Hypothyroidism, unspecified: Secondary | ICD-10-CM

## 2017-03-22 DIAGNOSIS — E782 Mixed hyperlipidemia: Secondary | ICD-10-CM

## 2017-03-22 DIAGNOSIS — R7309 Other abnormal glucose: Secondary | ICD-10-CM

## 2017-03-22 DIAGNOSIS — Z23 Encounter for immunization: Secondary | ICD-10-CM

## 2017-03-22 DIAGNOSIS — I10 Essential (primary) hypertension: Secondary | ICD-10-CM | POA: Diagnosis not present

## 2017-03-22 DIAGNOSIS — R7303 Prediabetes: Secondary | ICD-10-CM

## 2017-03-22 DIAGNOSIS — F988 Other specified behavioral and emotional disorders with onset usually occurring in childhood and adolescence: Secondary | ICD-10-CM

## 2017-03-22 DIAGNOSIS — Z0001 Encounter for general adult medical examination with abnormal findings: Secondary | ICD-10-CM

## 2017-03-22 DIAGNOSIS — N1831 Chronic kidney disease, stage 3a: Secondary | ICD-10-CM

## 2017-03-22 DIAGNOSIS — Z79899 Other long term (current) drug therapy: Secondary | ICD-10-CM

## 2017-03-22 DIAGNOSIS — N183 Chronic kidney disease, stage 3 (moderate): Secondary | ICD-10-CM

## 2017-03-22 DIAGNOSIS — Z1211 Encounter for screening for malignant neoplasm of colon: Secondary | ICD-10-CM

## 2017-03-22 DIAGNOSIS — Z1212 Encounter for screening for malignant neoplasm of rectum: Secondary | ICD-10-CM

## 2017-03-22 MED ORDER — METHYLPHENIDATE HCL 20 MG PO TABS: ORAL_TABLET | ORAL | 0 refills | Status: DC

## 2017-03-22 NOTE — Progress Notes (Signed)
[facility] Unk [name], M.D.     Uvaldo Bristle. Silverio Lay, P.A.-C Liane Comber, [facility] [address] [place], [place] [address] Telephone [phone] Telefax [phone] Annual Screening/Preventative Visit & Comprehensive Evaluation &  Examination     This very nice 66 y.o. DWF presents for a Screening/Preventative Visit & comprehensive evaluation and management of multiple medical co-morbidities.  Patient has been followed for HTN, T2_NIDDM  Prediabetes, Hyperlipidemia and Vitamin D Deficiency.                         Patient has hx/o ADD- inattentive type in  good clinical response with Bupropion and prn Ritalin improving her ability to focus and concentrate. She also has hx/o Depression compensated with the Bupropion and also Sertraline.      HTN predates since 2003. Patient's BP has been controlled at home and patient denies any cardiac symptoms as chest pain, palpitations, shortness of breath, dizziness or ankle swelling. Today's BP is at goal -  118/80.      Patient's hyperlipidemia is controlled with diet and medications. Patient denies myalgias or other medication SE's. Last lipids were at goal with HDL 76 & LDL 89.  Lab Results  Component Value Date   CHOL 212 (H) 12/22/2016   HDL 76 12/22/2016   LDLCALC 89 09/01/2016   TRIG 155 (H) 12/22/2016   CHOLHDL 2.8 12/22/2016      Patient has prediabetes pr(A1c 5.8% / 2011 and 6.0% / 2012)  and patient denies reactive hypoglycemic symptoms, visual blurring, diabetic polys, or paresthesias. Last A1c was normal and back to goal: Lab Results  Component Value Date   HGBA1C 5.4 12/22/2016      Patient das dx'd Hypothyroid circa 1990's & has been on replacement since. Finally, patient has history of Vitamin D Deficiency and last Vitamin D was at goal: Lab Results  Component Value Date   VD25OH 60 12/22/2016   Current Outpatient Medications on File Prior to  Visit  Medication Sig  . acetaminophen (TYLENOL) 500 MG tablet Take 500 mg by mouth every 6 (six) hours as needed for pain.  Marland Kitchen ALPRAZolam (XANAX) 0.5 MG tablet Take 1/2 to 1 tablet 3 x day if needed for anxiety or sleep  . Ascorbic Acid (VITAMIN C PO) Take 1 tablet by mouth daily.  Marland Kitchen aspirin EC 81 MG tablet Take 81 mg by mouth daily.  Marland Kitchen atenolol (TENORMIN) 100 MG tablet TAKE 1 TABLET BY MOUTH DAILY  . buPROPion (WELLBUTRIN XL) 150 MG 24 hr tablet TAKE 3 TABLETS BY MOUTH EVERY MORNING FOR MOOD/DEPRESSION  . calcium carbonate (TUMS - DOSED IN MG ELEMENTAL CALCIUM) 500 MG chewable tablet Chew 1 tablet by mouth as needed for indigestion. Uses Rolaids  . Cholecalciferol (VITAMIN D-3) 5000 UNITS TABS Take 5,000 Units by mouth daily.   Marland Kitchen docusate sodium (COLACE) 100 MG capsule Take 100 mg by mouth 2 (two) times daily as needed for mild constipation.  . hyoscyamine (LEVSIN SL) 0.125 MG SL tablet Place 0.125 mg under the tongue every 4 (four) hours as needed.  Marland Kitchen levothyroxine (SYNTHROID, LEVOTHROID) 125 MCG tablet TAKE 1 TABLET BY MOUTH ONCE DAILY  . Magnesium 250 MG TABS Take 250 mg by mouth daily.  . meclizine (ANTIVERT) 25 MG tablet TAKE 1 TABLET BY MOUTH 3 TIMES DAILY AS NEEDED FOR NAUSEA  . Multiple Vitamins-Minerals (MULTIVITAMIN WITH MINERALS) tablet Take 1 tablet by mouth daily.  . ondansetron (  ZOFRAN) 8 MG tablet TAKE 1 TABLET 3 x  DAILY AS NEEDED FOR NAUSEA  . OSCIMIN SR 0.375 MG 12 hr tablet TAKE 1 TO 2 TABLETS BY MOUTH TWICE DAILY  . pantoprazole (PROTONIX) 40 MG tablet Take 1 tablet (40 mg total) by mouth daily.  . ranitidine (ZANTAC) 300 MG tablet Take 1 to 2 tablets daily for heartburn & reflux to allow wean and transition from PPI / pantoprazole  . sertraline (ZOLOFT) 100 MG tablet TAKE 1 TABLET BY MOUTH ONCE DAILY FOR MOOD AND DEPRESSION  . trimethoprim (TRIMPEX) 100 MG tablet TAKE 1 TABLET BY MOUTH TWICE DAILY   No current facility-administered medications on file prior to visit.     Allergies  Allergen Reactions  . Ace Inhibitors Other (See Comments)    Acute renal failure (lisinopril)  . Cephalexin Anaphylaxis  . Macrobid [Nitrofurantoin] Other (See Comments)    Causes hyperthermia   . Codeine Nausea Only   Past Medical History:  Diagnosis Date  . Diverticulosis   . Emphysema lung ([place])   . Helicobacter pylori (H. pylori)   . History of IBS   . IBS (irritable bowel syndrome)   . Migraines   . OCD (obsessive compulsive disorder)   . Prediabetes   . Renal disorder    AKI due to lisinpril, his. pyelonephritis,   . Sepsis ([place])   . Vitamin D deficiency    Health Maintenance  Topic Date Due  . PAP SMEAR  06/28/1972  . PNA vac Low Risk Adult (1 of 2 - PCV13) 06/28/2016  . INFLUENZA VACCINE  11/25/2016  . COLONOSCOPY  11/16/2017  . MAMMOGRAM  05/04/2018  . TETANUS/TDAP  04/28/2023  . DEXA SCAN  Completed  . Hepatitis C Screening  Completed  . HIV Screening  Completed   Immunization History  Administered Date(s) Administered  . Influenza-Unspecified 01/31/2016  . Pneumococcal Polysaccharide-23 02/12/1999  . Td 04/27/2013  . Zoster 11/24/2005   Past Surgical History:  Procedure Laterality Date  . BREAST ENHANCEMENT SURGERY    . BUNIONECTOMY     Bil  . COLON SURGERY     colonoscopy  . HEMORRHOID SURGERY    . OOPHORECTOMY    . TONSILLECTOMY     Family History  Problem Relation Age of Onset  . Colon polyps Mother   . Colon cancer Neg Hx    Social History   Tobacco Use  . Smoking status: Former Smoker    Packs/day: 1.00    Years: 40.00    Pack years: 40.00    Types: Cigarettes    Last attempt to quit: 04/27/2002    Years since quitting: 14.9  . Smokeless tobacco: Never Used  Substance Use Topics  . Alcohol use: No    Comment: ocassional  . Drug use: No    ROS Constitutional: Denies fever, chills, weight loss/gain, headaches, insomnia,  night sweats, and change in appetite. Does c/o fatigue. Eyes: Denies redness, blurred vision,  diplopia, discharge, itchy, watery eyes.  ENT: Denies discharge, congestion, post nasal drip, epistaxis, sore throat, earache, hearing loss, dental pain, Tinnitus, Vertigo, Sinus pain, snoring.  Cardio: Denies chest pain, palpitations, irregular heartbeat, syncope, dyspnea, diaphoresis, orthopnea, PND, claudication, edema Respiratory: denies cough, dyspnea, DOE, pleurisy, hoarseness, laryngitis, wheezing.  Gastrointestinal: Denies dysphagia, heartburn, reflux, water brash, pain, cramps, nausea, vomiting, bloating, diarrhea, constipation, hematemesis, melena, hematochezia, jaundice, hemorrhoids Genitourinary: Denies dysuria, frequency, urgency, nocturia, hesitancy, discharge, hematuria, flank pain Breast: Breast lumps, nipple discharge, bleeding.  Musculoskeletal: Denies arthralgia, myalgia, stiffness,  Jt. Swelling, pain, limp, and strain/sprain. Denies falls. Skin: Denies puritis, rash, hives, warts, acne, eczema, changing in skin lesion Neuro: No weakness, tremor, incoordination, spasms, paresthesia, pain Psychiatric: Denies confusion, memory loss, sensory loss. Denies Depression. Endocrine: Denies change in weight, skin, hair change, nocturia, and paresthesia, diabetic polys, visual blurring, hyper / hypo glycemic episodes.  Heme/Lymph: No excessive bleeding, bruising, enlarged lymph nodes.  Physical Exam  BP 118/80   Pulse 68   Temp (!) 97 F (36.1 C)   Resp 16   Ht 5\' 3"  (1.6 m)   Wt 160 lb 6.4 oz (72.8 kg)   BMI 28.41 kg/m   General Appearance: Well nourished, well groomed and in no apparent distress.  Eyes: PERRLA, EOMs, conjunctiva no swelling or erythema, normal fundi and vessels. Sinuses: No frontal/maxillary tenderness ENT/Mouth: EACs patent / TMs  nl. Nares clear without erythema, swelling, mucoid exudates. Oral hygiene is good. No erythema, swelling, or exudate. Tongue normal, non-obstructing. Tonsils not swollen or erythematous. Hearing normal.  Neck: Supple, thyroid  normal. No bruits, nodes or JVD. Respiratory: Respiratory effort normal.  BS equal and clear bilateral without rales, rhonci, wheezing or stridor. Cardio: Heart sounds are normal with regular rate and rhythm and no murmurs, rubs or gallops. Peripheral pulses are normal and equal bilaterally without edema. No aortic or femoral bruits. Chest: symmetric with normal excursions and percussion. Breasts: Symmetric, without lumps, nipple discharge, retractions, or fibrocystic changes.  Abdomen: Flat, soft with bowel sounds active. Nontender, no guarding, rebound, hernias, masses, or organomegaly.  Lymphatics: Non tender without lymphadenopathy.  Genitourinary:  Musculoskeletal: Full ROM all peripheral extremities, joint stability, 5/5 strength, and normal gait. Skin: Warm and dry without rashes, lesions, cyanosis, clubbing or  ecchymosis.  Neuro: Cranial nerves intact, reflexes equal bilaterally. Normal muscle tone, no cerebellar symptoms. Sensation intact.  Pysch: Alert and oriented X 3, normal affect, Insight and Judgment appropriate.   Assessment and Plan  1. Annual Preventative Screening Examination  2. Essential hypertension  - EKG 12-Lead - Urinalysis, Routine w reflex microscopic - Microalbumin / creatinine urine ratio - CBC with Differential/Platelet - BASIC METABOLIC PANEL WITH GFR - Magnesium - TSH  3. Hyperlipidemia, mixed  - EKG 12-Lead - Hepatic function panel - Lipid panel - TSH  4. Prediabetes  - EKG 12-Lead - Hemoglobin A1c - Insulin, random  5. Vitamin D deficiency  - VITAMIN D 25 Hydroxy   6. Abnormal glucose  - Hemoglobin A1c - Insulin, random  7. Hypothyroidism   8. CKD Stage 3 (GFR 55 ml/min)   9. Screening for colorectal cancer  - POC Hemoccult Bld/St  10. Screening for ischemic heart disease  - EKG 12-Lead  11. Medication management  - Urinalysis, Routine w reflex microscopic - Microalbumin / creatinine urine ratio - CBC with  Differential/Platelet - BASIC METABOLIC PANEL WITH GFR - Hepatic function panel - Magnesium - Lipid panel - TSH - Hemoglobin A1c - Insulin, random - VITAMIN D 25 Hydroxy  12. Need for immunization against influenza  - Flu vaccine HIGH DOSE PF (Fluzone High dose)  13. Need for prophylactic vaccination against Streptococcus pneumoniae (pneumococcus)  - Pneumococcal conjugate vaccine 13-valent       Patient was counseled in prudent diet to achieve/maintain BMI less than 25 for weight control, BP monitoring, regular exercise and medications. Discussed med's effects and SE's. Screening labs and tests as requested with regular follow-up as recommended. Over 40 minutes of exam, counseling, chart review and high complex critical decision making was performed.

## 2017-03-22 NOTE — Patient Instructions (Signed)

## 2017-03-23 ENCOUNTER — Other Ambulatory Visit: Payer: Self-pay | Admitting: Internal Medicine

## 2017-03-23 DIAGNOSIS — N183 Chronic kidney disease, stage 3 unspecified: Secondary | ICD-10-CM

## 2017-03-23 DIAGNOSIS — N1831 Chronic kidney disease, stage 3a: Secondary | ICD-10-CM

## 2017-03-23 DIAGNOSIS — E039 Hypothyroidism, unspecified: Secondary | ICD-10-CM

## 2017-03-23 LAB — LIPID PANEL
Cholesterol: 203 mg/dL — ABNORMAL HIGH (ref <200)
HDL: 63 mg/dL (ref >50)
LDL Cholesterol (Calc): 115 mg/dL (calc) — ABNORMAL HIGH
NON-HDL CHOLESTEROL (CALC): 140 mg/dL — AB (ref <130)
Total CHOL/HDL Ratio: 3.2 (calc) (ref <5.0)
Triglycerides: 139 mg/dL (ref <150)

## 2017-03-23 LAB — BASIC METABOLIC PANEL WITH GFR
BUN / CREAT RATIO: 19 (calc) (ref 6–22)
BUN: 29 mg/dL — ABNORMAL HIGH (ref 7–25)
CHLORIDE: 102 mmol/L (ref 98–110)
CO2: 27 mmol/L (ref 20–32)
CREATININE: 1.51 mg/dL — AB (ref 0.50–0.99)
Calcium: 9.9 mg/dL (ref 8.6–10.4)
GFR, Est African American: 42 mL/min/{1.73_m2} — ABNORMAL LOW (ref >=60)
GFR, Est Non African American: 36 mL/min/{1.73_m2} — ABNORMAL LOW (ref >=60)
GLUCOSE: 94 mg/dL (ref 65–99)
Potassium: 5.7 mmol/L — ABNORMAL HIGH (ref 3.5–5.3)
SODIUM: 137 mmol/L (ref 135–146)

## 2017-03-23 LAB — URINALYSIS, ROUTINE W REFLEX MICROSCOPIC
BILIRUBIN URINE: NEGATIVE
GLUCOSE, UA: NEGATIVE
Hgb urine dipstick: NEGATIVE
Ketones, ur: NEGATIVE
LEUKOCYTES UA: NEGATIVE
Nitrite: NEGATIVE
PROTEIN: NEGATIVE
SPECIFIC GRAVITY, URINE: 1.019 (ref 1.001–1.03)
pH: 5.5 (ref 5.0–8.0)

## 2017-03-23 LAB — CBC WITH DIFFERENTIAL/PLATELET
BASOS PCT: 1 %
Basophils Absolute: 59 cells/uL (ref 0–200)
EOS ABS: 248 {cells}/uL (ref 15–500)
EOS PCT: 4.2 %
HCT: 41.7 % (ref 35.0–45.0)
HEMOGLOBIN: 14.1 g/dL (ref 11.7–15.5)
Lymphs Abs: 1522 cells/uL (ref 850–3900)
MCH: 32 pg (ref 27.0–33.0)
MCHC: 33.8 g/dL (ref 32.0–36.0)
MCV: 94.8 fL (ref 80.0–100.0)
MPV: 10.1 fL (ref 7.5–12.5)
Monocytes Relative: 7.5 %
NEUTROS ABS: 3629 {cells}/uL (ref 1500–7800)
Neutrophils Relative %: 61.5 %
Platelets: 348 10*3/uL (ref 140–400)
RBC: 4.4 10*6/uL (ref 3.80–5.10)
RDW: 12.3 % (ref 11.0–15.0)
Total Lymphocyte: 25.8 %
WBC mixed population: 443 cells/uL (ref 200–950)
WBC: 5.9 10*3/uL (ref 3.8–10.8)

## 2017-03-23 LAB — INSULIN, RANDOM: Insulin: 6 u[IU]/mL (ref 2.0–19.6)

## 2017-03-23 LAB — HEPATIC FUNCTION PANEL
AG Ratio: 1.5 (calc) (ref 1.0–2.5)
ALKALINE PHOSPHATASE (APISO): 71 U/L (ref 33–130)
ALT: 14 U/L (ref 6–29)
AST: 16 U/L (ref 10–35)
Albumin: 4 g/dL (ref 3.6–5.1)
BILIRUBIN INDIRECT: 0.3 mg/dL (ref 0.2–1.2)
BILIRUBIN TOTAL: 0.4 mg/dL (ref 0.2–1.2)
Bilirubin, Direct: 0.1 mg/dL (ref 0.0–0.2)
Globulin: 2.7 g/dL (calc) (ref 1.9–3.7)
TOTAL PROTEIN: 6.7 g/dL (ref 6.1–8.1)

## 2017-03-23 LAB — MICROALBUMIN / CREATININE URINE RATIO
CREATININE, URINE: 98 mg/dL (ref 20–275)
MICROALB/CREAT RATIO: 8 ug/mg{creat} (ref <30)
Microalb, Ur: 0.8 mg/dL

## 2017-03-23 LAB — MAGNESIUM: Magnesium: 1.9 mg/dL (ref 1.5–2.5)

## 2017-03-23 LAB — HEMOGLOBIN A1C
EAG (MMOL/L): 6 (calc)
HEMOGLOBIN A1C: 5.4 %{Hb} (ref <5.7)
MEAN PLASMA GLUCOSE: 108 (calc)

## 2017-03-23 LAB — TSH: TSH: 7.54 mIU/L — ABNORMAL HIGH (ref 0.40–4.50)

## 2017-03-23 LAB — VITAMIN D 25 HYDROXY (VIT D DEFICIENCY, FRACTURES): Vit D, 25-Hydroxy: 75 ng/mL (ref 30–100)

## 2017-03-24 ENCOUNTER — Encounter: Payer: Self-pay | Admitting: Internal Medicine

## 2017-03-24 ENCOUNTER — Other Ambulatory Visit: Payer: Self-pay

## 2017-03-24 DIAGNOSIS — Z1212 Encounter for screening for malignant neoplasm of rectum: Principal | ICD-10-CM

## 2017-03-24 DIAGNOSIS — Z1211 Encounter for screening for malignant neoplasm of colon: Secondary | ICD-10-CM

## 2017-03-24 LAB — POC HEMOCCULT BLD/STL (HOME/3-CARD/SCREEN)
Card #3 Fecal Occult Blood, POC: NEGATIVE
FECAL OCCULT BLD: NEGATIVE
FECAL OCCULT BLD: NEGATIVE

## 2017-03-25 DIAGNOSIS — Z1211 Encounter for screening for malignant neoplasm of colon: Secondary | ICD-10-CM | POA: Diagnosis not present

## 2017-03-31 ENCOUNTER — Telehealth: Payer: Self-pay | Admitting: *Deleted

## 2017-03-31 NOTE — Telephone Encounter (Signed)
Patient was advised to stop her Trimethoprim by the nephrology doctors in preparation for her upcoming appointment there.

## 2017-04-26 ENCOUNTER — Other Ambulatory Visit: Payer: Self-pay | Admitting: Internal Medicine

## 2017-04-26 ENCOUNTER — Other Ambulatory Visit: Payer: Self-pay | Admitting: Physician Assistant

## 2017-04-26 DIAGNOSIS — K589 Irritable bowel syndrome without diarrhea: Secondary | ICD-10-CM

## 2017-04-26 MED FILL — MECLIZINE 25 MG TABLET: 25 | 30 days supply | Qty: 90 | Fill #0

## 2017-04-26 MED FILL — SERTRALINE HCL 100 MG TAB: 100 | 90 days supply | Qty: 90 | Fill #0

## 2017-04-26 MED FILL — predniSONE 20 MG TABS: 20 | 11 days supply | Qty: 20 | Fill #1

## 2017-04-26 MED FILL — OSCIMIN SL 0.125 MG TABLET: 0.125 | 17 days supply | Qty: 100 | Fill #0

## 2017-04-28 ENCOUNTER — Ambulatory Visit (INDEPENDENT_AMBULATORY_CARE_PROVIDER_SITE_OTHER): Payer: PPO | Admitting: Adult Health

## 2017-04-28 ENCOUNTER — Encounter: Payer: Self-pay | Admitting: Adult Health

## 2017-04-28 ENCOUNTER — Telehealth: Payer: Self-pay | Admitting: Internal Medicine

## 2017-04-28 VITALS — BP 102/70 | HR 68 | Temp 97.7°F | Ht 63.0 in | Wt 156.0 lb

## 2017-04-28 DIAGNOSIS — N390 Urinary tract infection, site not specified: Secondary | ICD-10-CM | POA: Diagnosis not present

## 2017-04-28 DIAGNOSIS — R300 Dysuria: Secondary | ICD-10-CM | POA: Diagnosis not present

## 2017-04-28 DIAGNOSIS — N183 Chronic kidney disease, stage 3 (moderate): Secondary | ICD-10-CM | POA: Diagnosis not present

## 2017-04-28 DIAGNOSIS — H43812 Vitreous degeneration, left eye: Secondary | ICD-10-CM | POA: Diagnosis not present

## 2017-04-28 DIAGNOSIS — N1831 Chronic kidney disease, stage 3a: Secondary | ICD-10-CM

## 2017-04-28 MED ORDER — CIPROFLOXACIN HCL 250 MG PO TABS: 250.0000 mg | ORAL_TABLET | Freq: Two times a day (BID) | ORAL | 0 refills | Status: DC

## 2017-04-28 MED FILL — CIPROFLOXACIN HCL 250 MG TA: 250 | 7 days supply | Qty: 14 | Fill #0

## 2017-04-28 NOTE — Patient Instructions (Signed)

## 2017-04-28 NOTE — Progress Notes (Signed)
Assessment and Plan:  James was seen today for urinary tract infection.  Diagnoses and all orders for this visit:  Dysuria -     Urinalysis w microscopic + reflex cultur -     ciprofloxacin (CIPRO) 250 MG tablet; Take 1 tablet (250 mg total) by mouth 2 (two) times daily. Reached out to Dr. Shelva Majestic office to coordinate follow up and verify agreement with plan.   CKD Stage 3 (GFR 36 ml/min) -     BASIC METABOLIC PANEL WITH GFR  Further disposition pending results of labs. Discussed med's effects and SE's.   Over 15 minutes of exam, counseling, chart review, and critical decision making was performed.   Future Appointments  Date Time Provider [place]  06/22/2017  2:30 PM Liane Comber, NP [facility] None  09/27/2017  2:30 PM Unk [name], MD [facility] None  05/02/2018  2:00 PM Unk [name], MD [facility] None    ------------------------------------------------------------------------------------------------------------------   HPI BP 102/70   Pulse 68   Temp 97.7 F (36.5 C)   Ht 5\' 3"  (1.6 m)   Wt 156 lb (70.8 kg)   SpO2 97%   BMI 27.63 kg/m   67 y.o.female presents for dysuria, frequency, urgency x3 days - she reports she started to notice odor to urine - denies cloudiness/discoloration. She denies N/V/abdominal pain, fever/chills. She denies back pain coinciding with urinary sympsoms. She does endorse chronic lower back pain that is improved from baseline.   She is not married, not sexually active. Denies vaginal itching, rash   She has been treated by daily bactrim for recurrent UTIs leading to sepsis; she was recently advised to stop this medication by this officer per request of nephrology in preparation for upcoming appointment on12/5. Unclear when this appointment is. It appears she was meant to be contacted directly but has not received any phone calls or voice messages.   Past Medical History:  Diagnosis Date  . Diverticulosis   .  Emphysema lung ([place])   . Helicobacter pylori (H. pylori)   . History of IBS   . IBS (irritable bowel syndrome)   . Migraines   . OCD (obsessive compulsive disorder)   . Prediabetes   . Renal disorder    AKI due to lisinpril, his. pyelonephritis,   . Sepsis ([place])   . Vitamin D deficiency      Allergies  Allergen Reactions  . Ace Inhibitors Other (See Comments)    Acute renal failure (lisinopril)  . Cephalexin Anaphylaxis  . Macrobid [Nitrofurantoin] Other (See Comments)    Causes hyperthermia   . Codeine Nausea Only    Current Outpatient Medications on File Prior to Visit  Medication Sig  . acetaminophen (TYLENOL) 500 MG tablet Take 500 mg by mouth every 6 (six) hours as needed for pain.  Marland Kitchen ALPRAZolam (XANAX) 0.5 MG tablet Take 1/2 to 1 tablet 3 x day if needed for anxiety or sleep  . Ascorbic Acid (VITAMIN C PO) Take 1 tablet by mouth daily.  Marland Kitchen aspirin EC 81 MG tablet Take 81 mg by mouth daily.  Marland Kitchen atenolol (TENORMIN) 100 MG tablet TAKE 1 TABLET BY MOUTH DAILY  . buPROPion (WELLBUTRIN XL) 150 MG 24 hr tablet TAKE 3 TABLETS BY MOUTH EVERY MORNING FOR MOOD/DEPRESSION  . calcium carbonate (TUMS - DOSED IN MG ELEMENTAL CALCIUM) 500 MG chewable tablet Chew 1 tablet by mouth as needed for indigestion. Uses Rolaids  . Cholecalciferol (VITAMIN D-3) 5000 UNITS TABS Take 5,000 Units by mouth daily.   Marland Kitchen  docusate sodium (COLACE) 100 MG capsule Take 100 mg by mouth 2 (two) times daily as needed for mild constipation.  . hyoscyamine (LEVSIN SL) 0.125 MG SL tablet PLACE 1 TABLET (0.125 MG TOTAL) UNDER THE TONGUE EVERY 4 (FOUR) HOURS AS NEEDED.  Marland Kitchen levothyroxine (SYNTHROID, LEVOTHROID) 125 MCG tablet TAKE 1 TABLET BY MOUTH ONCE DAILY  . Magnesium 250 MG TABS Take 250 mg by mouth daily.  . meclizine (ANTIVERT) 25 MG tablet TAKE 1 TABLET BY MOUTH 3 TIMES DAILY AS NEEDED FOR NAUSEA  . Multiple Vitamins-Minerals (MULTIVITAMIN WITH MINERALS) tablet Take 1 tablet by mouth daily.  . ondansetron  (ZOFRAN) 8 MG tablet TAKE 1 TABLET 3 x  DAILY AS NEEDED FOR NAUSEA  . pantoprazole (PROTONIX) 40 MG tablet Take 1 tablet (40 mg total) by mouth daily.  . ranitidine (ZANTAC) 300 MG tablet Take 1 to 2 tablets daily for heartburn & reflux to allow wean and transition from PPI / pantoprazole  . sertraline (ZOLOFT) 100 MG tablet TAKE 1 TABLET BY MOUTH ONCE DAILY FOR MOOD AND DEPRESSION  . methylphenidate (RITALIN) 20 MG tablet 1/2 to 1 tab 1 to 2 x / daily as needed for ADD or alertness  . trimethoprim (TRIMPEX) 100 MG tablet TAKE 1 TABLET BY MOUTH TWICE DAILY (Patient not taking: Reported on 04/28/2017)   No current facility-administered medications on file prior to visit.     ROS: all negative except above.   Physical Exam:  BP 102/70   Pulse 68   Temp 97.7 F (36.5 C)   Ht 5\' 3"  (1.6 m)   Wt 156 lb (70.8 kg)   SpO2 97%   BMI 27.63 kg/m   General Appearance: Well nourished, in no apparent distress. Neck: Supple.  Respiratory: Respiratory effort normal, BS equal bilaterally without rales, rhonchi, wheezing or stridor.  Cardio: RRR with no MRGs. Brisk peripheral pulses without edema.  Abdomen: Soft, + BS.  Non tender, no guarding, rebound, hernias, masses. Lymphatics: Non tender without lymphadenopathy.  Musculoskeletal: Full ROM, 5/5 strength, normal gait.  Skin: Warm, dry without rashes, lesions, ecchymosis.  Neuro: Cranial nerves intact. Normal muscle tone, no cerebellar symptoms. Sensation intact.  Psych: Awake and oriented X 3, normal affect, Insight and Judgment appropriate.     Izora Ribas, NP 4:17 PM [facility]

## 2017-04-28 NOTE — Telephone Encounter (Signed)
Called [facility],  to advise patient being seen in our office for UTI, please advise if we can rx cipro. Left message with Hinton Dyer, for Dr Verneda Skill, can patient take Cipro for UTI? Yes. Cipro ok, per [facility]. Patient was advised previously to stop Trimethoprim due to potential to elevate Potassium levels. Patient is aware Okay to take Cipro 250mg  x bid for 7days.  Also scheduled patient for 05-26-17, with Dr Donato Heinz, please fax any notes or labs since 03-27-2017.

## 2017-04-30 MED FILL — METHYLPHENIDATE 20 MG TAB: 20 | 30 days supply | Qty: 60 | Fill #0

## 2017-05-01 LAB — URINALYSIS W MICROSCOPIC + REFLEX CULTURE
Bilirubin Urine: NEGATIVE
GLUCOSE, UA: NEGATIVE
Ketones, ur: NEGATIVE
NITRITES URINE, INITIAL: NEGATIVE
PH: 6.5 (ref 5.0–8.0)
Specific Gravity, Urine: 1.021 (ref 1.001–1.03)
Squamous Epithelial / LPF: NONE SEEN /HPF (ref <=5)

## 2017-05-01 LAB — BASIC METABOLIC PANEL WITH GFR
BUN/Creatinine Ratio: 20 (calc) (ref 6–22)
BUN: 29 mg/dL — ABNORMAL HIGH (ref 7–25)
CO2: 28 mmol/L (ref 20–32)
CREATININE: 1.48 mg/dL — AB (ref 0.50–0.99)
Calcium: 9.7 mg/dL (ref 8.6–10.4)
Chloride: 101 mmol/L (ref 98–110)
GFR, EST AFRICAN AMERICAN: 43 mL/min/{1.73_m2} — AB (ref >=60)
GFR, EST NON AFRICAN AMERICAN: 37 mL/min/{1.73_m2} — AB (ref >=60)
Glucose, Bld: 81 mg/dL (ref 65–99)
POTASSIUM: 4.7 mmol/L (ref 3.5–5.3)
SODIUM: 138 mmol/L (ref 135–146)

## 2017-05-01 LAB — URINE CULTURE
MICRO NUMBER: 90008505
SPECIMEN QUALITY:: ADEQUATE

## 2017-05-01 LAB — CULTURE INDICATED

## 2017-05-12 DIAGNOSIS — H2513 Age-related nuclear cataract, bilateral: Secondary | ICD-10-CM | POA: Diagnosis not present

## 2017-05-18 ENCOUNTER — Other Ambulatory Visit: Payer: Self-pay | Admitting: Internal Medicine

## 2017-05-18 MED FILL — buPROPion HCL ER (XL) 150 M: 150 | 90 days supply | Qty: 270 | Fill #0

## 2017-05-18 MED FILL — LEVOTHYROXINE 125 MCG TABLE: 125 | 90 days supply | Qty: 90 | Fill #0

## 2017-05-18 MED FILL — ATENOLOL 100 MG TABLET: 100 | 90 days supply | Qty: 90 | Fill #1

## 2017-05-26 DIAGNOSIS — N179 Acute kidney failure, unspecified: Secondary | ICD-10-CM | POA: Diagnosis not present

## 2017-05-26 DIAGNOSIS — R7303 Prediabetes: Secondary | ICD-10-CM | POA: Diagnosis not present

## 2017-05-26 DIAGNOSIS — I129 Hypertensive chronic kidney disease with stage 1 through stage 4 chronic kidney disease, or unspecified chronic kidney disease: Secondary | ICD-10-CM | POA: Diagnosis not present

## 2017-05-26 DIAGNOSIS — J439 Emphysema, unspecified: Secondary | ICD-10-CM | POA: Diagnosis not present

## 2017-05-26 DIAGNOSIS — E785 Hyperlipidemia, unspecified: Secondary | ICD-10-CM | POA: Diagnosis not present

## 2017-05-26 DIAGNOSIS — N390 Urinary tract infection, site not specified: Secondary | ICD-10-CM | POA: Diagnosis not present

## 2017-05-26 DIAGNOSIS — R809 Proteinuria, unspecified: Secondary | ICD-10-CM | POA: Diagnosis not present

## 2017-05-26 DIAGNOSIS — N183 Chronic kidney disease, stage 3 (moderate): Secondary | ICD-10-CM | POA: Diagnosis not present

## 2017-05-26 DIAGNOSIS — Z87440 Personal history of urinary (tract) infections: Secondary | ICD-10-CM | POA: Diagnosis not present

## 2017-05-26 MED FILL — CIPROFLOXACIN HCL 250 MG TA: 250 | 10 days supply | Qty: 20 | Fill #0

## 2017-05-27 ENCOUNTER — Other Ambulatory Visit: Payer: Self-pay | Admitting: Nephrology

## 2017-05-27 DIAGNOSIS — N183 Chronic kidney disease, stage 3 unspecified: Secondary | ICD-10-CM

## 2017-06-03 ENCOUNTER — Ambulatory Visit
Admission: RE | Admit: 2017-06-03 | Discharge: 2017-06-03 | Disposition: A | Discharge status: Home / Self Care | Payer: PPO | Source: Ambulatory Visit | Attending: Nephrology | Admitting: Nephrology

## 2017-06-03 ENCOUNTER — Other Ambulatory Visit: Payer: Self-pay | Admitting: Nephrology

## 2017-06-03 DIAGNOSIS — N183 Chronic kidney disease, stage 3 unspecified: Secondary | ICD-10-CM

## 2017-06-18 ENCOUNTER — Ambulatory Visit
Admission: RE | Admit: 2017-06-18 | Discharge: 2017-06-18 | Disposition: A | Discharge status: Home / Self Care | Payer: PPO | Source: Ambulatory Visit | Attending: Nephrology | Admitting: Nephrology

## 2017-06-18 DIAGNOSIS — N183 Chronic kidney disease, stage 3 unspecified: Secondary | ICD-10-CM

## 2017-06-21 DIAGNOSIS — N390 Urinary tract infection, site not specified: Secondary | ICD-10-CM

## 2017-06-21 HISTORY — DX: Urinary tract infection, site not specified: N39.0

## 2017-06-21 NOTE — Progress Notes (Signed)
MEDICARE ANNUAL WELLNESS VISIT AND FOLLOW UP  Assessment:   Diagnoses and all orders for this visit:  Encounter for Medicare annual wellness exam  Essential hypertension At goal; continue medication Monitor blood pressure at home; call if consistently over 130/80 Continue DASH diet.   Reminder to go to the ER if any CP, SOB, nausea, dizziness, severe HA, changes vision/speech, left arm numbness and tingling and jaw pain. -     EKG 12-Lead -     [place], RETROPERITNL ABD,  LTD  LBBB (left bundle branch block) Continue to monitor- cardiology as indicated  Migraine without status migrainosus, not intractable, unspecified migraine type Reports improved recently;  No significant issues or concerns  Irritable bowel syndrome, unspecified type Symptoms stable/improved - continue monitoring diet/lifestyle modification  Hypothyroidism, unspecified type continue medications the same pending lab results reminded to take on an empty stomach 30-15mins before food.  -     TSH  CKD Stage 3 (GFR 55 ml/min) Increase fluids, avoid NSAIDS, monitor sugars, will monitor Continue follow up with nephrology -     BASIC METABOLIC PANEL WITH GFR  Attention deficit hyperactivity disorder (ADHD), predominantly inattentive type Continue medications: wellbutrin daily, ritalin PRN Helps with focus, no AE's. The patient was counseled on the addictive nature of the medication and was encouraged to take drug holidays when not needed. Advised to limit to 5 days a week or less, she is agreeable and optimistic that this is reasonable.   Depression, controlled Continue medications; discussed minimizing use of benzos to avoid tolerance Lifestyle discussed: diet/exerise, sleep hygiene, stress management, hydration  Diverticulosis of intestine without bleeding, unspecified intestinal tract location Due for colonoscopy this July - will refer back to GI at next visit  Medication management -     CBC with  Differential/Platelet -     BASIC METABOLIC PANEL WITH GFR -     Hepatic function panel  Mixed hyperlipidemia Fair control by lifestyle modification only Continue low cholesterol diet and exercise.  -     Lipid panel  Obsessive-compulsive disorder, unspecified type Symptoms improved on SSRI  Other abnormal glucose Recent A1Cs at goal Discussed disease and risks Discussed diet/exercise, weight management  -     BASIC METABOLIC PANEL WITH GFR  Thrombocytopenia (H[facility]) -     CBC with Differential/Platelet  Vitamin D deficiency At goal at recent check; continue to recommend supplementation for goal of 70-100 Defer vitamin D level  Lower urinary tract infection  Chronic recurrent, followed by urology/nephrology   Estrogen deficiency -     DG Bone Density; Future  Cellulitis of left toe Ongoing for 3 weeks; doxycycline sent in to pharmacy Monitor symptoms for improvement, call with any concerns.   Over 40 minutes of exam, counseling, chart review and critical decision making was performed Future Appointments  Date Time Provider [place]  09/27/2017  2:30 PM Unk [name], MD [facility] None  05/02/2018  2:00 PM Unk [name], MD [facility] None     Plan:   During the course of the visit the patient was educated and counseled about appropriate screening and preventive services including:    Pneumococcal vaccine   Prevnar 13  Influenza vaccine  Td vaccine  Screening electrocardiogram  Bone densitometry screening  Colorectal cancer screening  Diabetes screening  Glaucoma screening  Nutrition counseling   Advanced directives: requested   Subjective:  [name] is a 67 y.o. female who presents for Medicare Annual Wellness Visit and 3 month follow up.   Patient  is on an ADD medication (wellbutrin daily, ritalin PRN), she states that the medication is helping and she denies any adverse reactions.   she has a diagnosis of depression/anxiety  and is currently on wellbutrin, sertraline and xanax 0.5 mg TID PRN, reports symptoms are well controlled on current regimen. she currently reports using xanax  Very rarely - she reports she hasn't used in months.   BMI is Body mass index is 27.99 kg/m., she has been working on diet and exercise. Wt Readings from Last 3 Encounters:  06/22/17 158 lb (71.7 kg)  04/28/17 156 lb (70.8 kg)  03/22/17 160 lb 6.4 oz (72.8 kg)    Her blood pressure has been controlled at home, today their BP is BP: 100/60 She does not workout but is doing lots of intense housework. She denies chest pain, shortness of breath, dizziness.   She is not on cholesterol medication and denies myalgias. Her cholesterol is at goal. The cholesterol last visit was:   Lab Results  Component Value Date   CHOL 203 (H) 03/22/2017   HDL 63 03/22/2017   LDLCALC 89 09/01/2016   TRIG 139 03/22/2017   CHOLHDL 3.2 03/22/2017    She has been working on diet and exercise for glucose management, and denies increased appetite, nausea, paresthesia of the feet, polydipsia, polyuria, visual disturbances and vomiting. Last A1C in the office was:  Lab Results  Component Value Date   HGBA1C 5.4 03/22/2017   Last GFR: Lab Results  Component Value Date   GFRNONAA 37 (L) 04/28/2017   Patient is on Vitamin D supplement and at goal at recent check:  Lab Results  Component Value Date   VD25OH 75 03/22/2017      Medication Review: Current Outpatient Medications on File Prior to Visit  Medication Sig Dispense Refill  . acetaminophen (TYLENOL) 500 MG tablet Take 500 mg by mouth every 6 (six) hours as needed for pain.    Marland Kitchen ALPRAZolam (XANAX) 0.5 MG tablet Take 1/2 to 1 tablet 3 x day if needed for anxiety or sleep 90 tablet 2  . Ascorbic Acid (VITAMIN C PO) Take 1 tablet by mouth daily.    Marland Kitchen aspirin EC 81 MG tablet Take 81 mg by mouth daily.    Marland Kitchen atenolol (TENORMIN) 100 MG tablet TAKE 1 TABLET BY MOUTH DAILY 90 tablet 1  . buPROPion  (WELLBUTRIN XL) 150 MG 24 hr tablet TAKE 3 TABLETS BY MOUTH EVERY MORNING FOR MOOD/DEPRESSION 270 tablet 1  . calcium carbonate (TUMS - DOSED IN MG ELEMENTAL CALCIUM) 500 MG chewable tablet Chew 1 tablet by mouth as needed for indigestion. Uses Rolaids    . Calcium-Phosphorus-Vitamin D (CALCIUM/D3 ADULT GUMMIES PO) Take by mouth.    . Cholecalciferol (VITAMIN D-3) 5000 UNITS TABS Take 5,000 Units by mouth daily.     Marland Kitchen DICYCLOMINE HCL PO Take by mouth 2 (two) times daily.    Marland Kitchen docusate sodium (COLACE) 100 MG capsule Take 100 mg by mouth 2 (two) times daily as needed for mild constipation.    . hyoscyamine (LEVSIN SL) 0.125 MG SL tablet PLACE 1 TABLET (0.125 MG TOTAL) UNDER THE TONGUE EVERY 4 (FOUR) HOURS AS NEEDED. 100 tablet 0  . levothyroxine (SYNTHROID, LEVOTHROID) 125 MCG tablet TAKE 1 TABLET BY MOUTH ONCE DAILY 90 tablet 1  . Magnesium 250 MG TABS Take 250 mg by mouth daily.    . meclizine (ANTIVERT) 25 MG tablet TAKE 1 TABLET BY MOUTH 3 TIMES DAILY AS NEEDED FOR NAUSEA  90 tablet 1  . Multiple Vitamins-Minerals (MULTIVITAMIN WITH MINERALS) tablet Take 1 tablet by mouth daily.    . ondansetron (ZOFRAN) 8 MG tablet TAKE 1 TABLET 3 x  DAILY AS NEEDED FOR NAUSEA 60 tablet 2  . ranitidine (ZANTAC) 300 MG tablet Take 1 to 2 tablets daily for heartburn & reflux to allow wean and transition from PPI / pantoprazole 180 tablet 99  . sertraline (ZOLOFT) 100 MG tablet TAKE 1 TABLET BY MOUTH ONCE DAILY FOR MOOD AND DEPRESSION 90 tablet 1  . ciprofloxacin (CIPRO) 250 MG tablet Take 1 tablet (250 mg total) by mouth 2 (two) times daily. (Patient not taking: Reported on 06/22/2017) 14 tablet 0  . methylphenidate (RITALIN) 20 MG tablet 1/2 to 1 tab 1 to 2 x / daily as needed for ADD or alertness 60 tablet 0  . pantoprazole (PROTONIX) 40 MG tablet Take 1 tablet (40 mg total) by mouth daily. (Patient not taking: Reported on 06/22/2017) 90 tablet 1  . trimethoprim (TRIMPEX) 100 MG tablet TAKE 1 TABLET BY MOUTH TWICE  DAILY (Patient not taking: Reported on 06/22/2017) 90 tablet 1   No current facility-administered medications on file prior to visit.     Allergies  Allergen Reactions  . Ace Inhibitors Other (See Comments)    Acute renal failure (lisinopril)  . Cephalexin Anaphylaxis  . Macrobid [Nitrofurantoin] Other (See Comments)    Causes hyperthermia   . Codeine Nausea Only    Current Problems (verified) Patient Active Problem List   Diagnosis Date Noted  . Lower urinary tract infection 06/21/2017  . Thrombocytopenia ([place]) 06/19/2015  . CKD Stage 3 (GFR 55 ml/min) 01/10/2015  . IBS  01/10/2015  . Mixed hyperlipidemia 06/12/2014  . Medication management 06/12/2014  . LBBB (left bundle branch block) 07/24/2013  . HTN (hypertension) 07/24/2013  . Depression, controlled   . Diverticulosis   . ADHD (attention deficit hyperactivity disorder)   . OCD (obsessive compulsive disorder)   . Other abnormal glucose   . Migraines   . Vitamin D deficiency   . Hypothyroidism 04/05/2013    Screening Tests Immunization History  Administered Date(s) Administered  . Influenza, High Dose Seasonal PF 03/22/2017  . Influenza-Unspecified 01/31/2016  . Pneumococcal Conjugate-13 03/22/2017  . Pneumococcal Polysaccharide-23 02/12/1999  . Td 04/27/2013  . Zoster 11/24/2005    Preventative care: Last colonoscopy: 10/2012 - due f/u 10/2017 Last mammogram: 04/2016 Last pap smear/pelvic exam: 2018  DEXA: 2010, DUE   Prior vaccinations: TD or Tdap: 2015  Influenza: 2018 Pneumococcal: 2000 Prevnar13:  2018 Shingles/Zostavax: 2007  Names of Other Physician/Practitioners you currently use: 1. [facility] here for primary care 2. Dr. Delman Cheadle, eye doctor, last visit 2019 3. , dentist, last visit 2018 -  Patient Care Team: Unk [name], MD as PCP - General (Internal Medicine)  SURGICAL HISTORY She  has a past surgical history that includes Tonsillectomy;  Oophorectomy; Bunionectomy; Hemorrhoid surgery; Breast enhancement surgery; and Colon surgery. FAMILY HISTORY Her family history includes Colon polyps in her mother. SOCIAL HISTORY She  reports that she quit smoking about 15 years ago. Her smoking use included cigarettes. She has a 40.00 pack-year smoking history. she has never used smokeless tobacco. She reports that she does not drink alcohol or use drugs.   MEDICARE WELLNESS OBJECTIVES: Physical activity: Current Exercise Habits: The patient has a physically strenous job, but has no regular exercise apart from work., Exercise limited by: None identified Cardiac risk factors: Cardiac Risk Factors include:  advanced age (>44men, >31 women);dyslipidemia;hypertension;smoking/ tobacco exposure Depression/mood screen:   Depression screen [facility] 2/9 06/22/2017  Decreased Interest 0  Down, Depressed, Hopeless 0  PHQ - 2 Score 0    ADLs:  In your present state of health, do you have any difficulty performing the following activities: 06/22/2017 03/22/2017  Hearing? N N  Vision? Y N  Comment Needs cataract surgery -  Difficulty concentrating or making decisions? Y N  Comment has noted some short term memory loss -  Walking or climbing stairs? N N  Dressing or bathing? N N  Doing errands, shopping? N N  Some recent data might be hidden     Cognitive Testing  Alert? Yes  Normal Appearance?Yes  Oriented to person? Yes  Place? Yes   Time? Yes  Recall of three objects?  Yes  Can perform simple calculations? Yes  Displays appropriate judgment?Yes  Can read the correct time from a watch face?Yes  EOL planning: Does Patient Have a Medical Advance Directive?: No Would patient like information on creating a medical advance directive?: Yes (MAU/Ambulatory/Procedural Areas - Information given)  Review of Systems  Constitutional: Negative for malaise/fatigue and weight loss.  HENT: Negative for hearing loss and tinnitus.   Eyes: Negative for  blurred vision and double vision.  Respiratory: Negative for cough, shortness of breath and wheezing.   Cardiovascular: Negative for chest pain, palpitations, orthopnea, claudication and leg swelling.  Gastrointestinal: Negative for abdominal pain, blood in stool, constipation, diarrhea, heartburn, melena, nausea and vomiting.  Genitourinary: Negative.   Musculoskeletal: Positive for falls (Mechanical). Negative for joint pain and myalgias.  Skin: Negative for rash.  Neurological: Negative for dizziness, tingling, sensory change, weakness and headaches.  Endo/Heme/Allergies: Negative for polydipsia.  Psychiatric/Behavioral: Positive for memory loss (Mild, "can't remember names" - no acute changes). Negative for depression, substance abuse and suicidal ideas. The patient is nervous/anxious. The patient does not have insomnia.   All other systems reviewed and are negative.    Objective:     Today's Vitals   06/22/17 1436  BP: 100/60  Pulse: 61  Temp: 97.7 F (36.5 C)  SpO2: 97%  Weight: 158 lb (71.7 kg)  Height: 5\' 3"  (1.6 m)   Body mass index is 27.99 kg/m.  General appearance: alert, no distress, WD/WN, female HEENT: normocephalic, sclerae anicteric, TMs pearly, nares patent, no discharge or erythema, pharynx normal Oral cavity: MMM, no lesions Neck: supple, no lymphadenopathy, no thyromegaly, no masses Heart: RRR, normal S1, S2, no murmurs Lungs: CTA bilaterally, no wheezes, rhonchi, or rales Abdomen: +bs, soft, non tender, non distended, no masses, no hepatomegaly, no splenomegaly Musculoskeletal: nontender, no swelling, no obvious deformity Extremities: no edema, no cyanosis, no clubbing - left toe injected with healing scabs Pulses: 2+ symmetric, upper and lower extremities, normal cap refill Neurological: alert, oriented x 3, CN2-12 intact, strength normal upper extremities and lower extremities, sensation normal throughout, DTRs 2+ throughout, no cerebellar signs, gait  normal Psychiatric: normal affect, behavior normal, pleasant   Medicare Attestation I have personally reviewed: The patient's medical and social history Their use of alcohol, tobacco or illicit drugs Their current medications and supplements The patient's functional ability including ADLs,fall risks, home safety risks, cognitive, and hearing and visual impairment Diet and physical activities Evidence for depression or mood disorders  The patient's weight, height, BMI, and visual acuity have been recorded in the chart.  I have made referrals, counseling, and provided education to the patient based on review of the above and I  have provided the patient with a written personalized care plan for preventive services.     Izora Ribas, NP   06/22/2017

## 2017-06-22 ENCOUNTER — Ambulatory Visit (INDEPENDENT_AMBULATORY_CARE_PROVIDER_SITE_OTHER): Payer: PPO | Admitting: Adult Health

## 2017-06-22 ENCOUNTER — Encounter: Payer: Self-pay | Admitting: Adult Health

## 2017-06-22 VITALS — BP 100/60 | HR 61 | Temp 97.7°F | Ht 63.0 in | Wt 158.0 lb

## 2017-06-22 DIAGNOSIS — F900 Attention-deficit hyperactivity disorder, predominantly inattentive type: Secondary | ICD-10-CM

## 2017-06-22 DIAGNOSIS — G43909 Migraine, unspecified, not intractable, without status migrainosus: Secondary | ICD-10-CM

## 2017-06-22 DIAGNOSIS — Z136 Encounter for screening for cardiovascular disorders: Secondary | ICD-10-CM | POA: Diagnosis not present

## 2017-06-22 DIAGNOSIS — N1831 Chronic kidney disease, stage 3a: Secondary | ICD-10-CM

## 2017-06-22 DIAGNOSIS — E782 Mixed hyperlipidemia: Secondary | ICD-10-CM | POA: Diagnosis not present

## 2017-06-22 DIAGNOSIS — N390 Urinary tract infection, site not specified: Secondary | ICD-10-CM

## 2017-06-22 DIAGNOSIS — Z0000 Encounter for general adult medical examination without abnormal findings: Secondary | ICD-10-CM

## 2017-06-22 DIAGNOSIS — E039 Hypothyroidism, unspecified: Secondary | ICD-10-CM

## 2017-06-22 DIAGNOSIS — E559 Vitamin D deficiency, unspecified: Secondary | ICD-10-CM

## 2017-06-22 DIAGNOSIS — I10 Essential (primary) hypertension: Secondary | ICD-10-CM

## 2017-06-22 DIAGNOSIS — I447 Left bundle-branch block, unspecified: Secondary | ICD-10-CM | POA: Diagnosis not present

## 2017-06-22 DIAGNOSIS — Z79899 Other long term (current) drug therapy: Secondary | ICD-10-CM | POA: Diagnosis not present

## 2017-06-22 DIAGNOSIS — Z0001 Encounter for general adult medical examination with abnormal findings: Secondary | ICD-10-CM

## 2017-06-22 DIAGNOSIS — K589 Irritable bowel syndrome without diarrhea: Secondary | ICD-10-CM

## 2017-06-22 DIAGNOSIS — N183 Chronic kidney disease, stage 3 (moderate): Secondary | ICD-10-CM | POA: Diagnosis not present

## 2017-06-22 DIAGNOSIS — F329 Major depressive disorder, single episode, unspecified: Secondary | ICD-10-CM

## 2017-06-22 DIAGNOSIS — K5790 Diverticulosis of intestine, part unspecified, without perforation or abscess without bleeding: Secondary | ICD-10-CM | POA: Diagnosis not present

## 2017-06-22 DIAGNOSIS — R7309 Other abnormal glucose: Secondary | ICD-10-CM

## 2017-06-22 DIAGNOSIS — F32A Depression, unspecified: Secondary | ICD-10-CM

## 2017-06-22 DIAGNOSIS — R7303 Prediabetes: Secondary | ICD-10-CM

## 2017-06-22 DIAGNOSIS — D696 Thrombocytopenia, unspecified: Secondary | ICD-10-CM | POA: Diagnosis not present

## 2017-06-22 DIAGNOSIS — E2839 Other primary ovarian failure: Secondary | ICD-10-CM

## 2017-06-22 DIAGNOSIS — F429 Obsessive-compulsive disorder, unspecified: Secondary | ICD-10-CM

## 2017-06-22 MED ORDER — DOXYCYCLINE HYCLATE 100 MG PO CAPS: ORAL_CAPSULE | ORAL | 0 refills | Status: DC

## 2017-06-22 NOTE — Patient Instructions (Signed)
Aim for 7+ servings of fruits and vegetables daily  80+ fluid ounces of water or unsweet tea for healthy kidneys  1 drink of alcohol per day  Limit animal fats in diet for cholesterol and heart health - choose grass fed whenever available  Aim for low stress - take time to unwind and care for your mental health  Aim for 150 min of moderate intensity exercise weekly for heart health, and weights twice weekly for bone health  Aim for 7-9 hours of sleep daily    Exercising to Stay Healthy Exercising regularly is important. It has many health benefits, such as:  Improving your overall fitness, flexibility, and endurance.  Increasing your bone density.  Helping with weight control.  Decreasing your body fat.  Increasing your muscle strength.  Reducing stress and tension.  Improving your overall health.  In order to become healthy and stay healthy, it is recommended that you do moderate-intensity and vigorous-intensity exercise. You can tell that you are exercising at a moderate intensity if you have a higher heart rate and faster breathing, but you are still able to hold a conversation. You can tell that you are exercising at a vigorous intensity if you are breathing much harder and faster and cannot hold a conversation while exercising. How often should I exercise? Choose an activity that you enjoy and set realistic goals. Your health care provider can help you to make an activity plan that works for you. Exercise regularly as directed by your health care provider. This may include:  Doing resistance training twice each week, such as: ? Push-ups. ? Sit-ups. ? Lifting weights. ? Using resistance bands.  Doing a given intensity of exercise for a given amount of time. Choose from these options: ? 150 minutes of moderate-intensity exercise every week. ? 75 minutes of vigorous-intensity exercise every week. ? A mix of moderate-intensity and vigorous-intensity exercise every  week.  Children, pregnant women, people who are out of shape, people who are overweight, and older adults may need to consult a health care provider for individual recommendations. If you have any sort of medical condition, be sure to consult your health care provider before starting a new exercise program. What are some exercise ideas? Some moderate-intensity exercise ideas include:  Walking at a rate of 1 mile in 15 minutes.  Biking.  Hiking.  Golfing.  Dancing.  Some vigorous-intensity exercise ideas include:  Walking at a rate of at least 4.5 miles per hour.  Jogging or running at a rate of 5 miles per hour.  Biking at a rate of at least 10 miles per hour.  Lap swimming.  Roller-skating or in-line skating.  Cross-country skiing.  Vigorous competitive sports, such as football, basketball, and soccer.  Jumping rope.  Aerobic dancing.  What are some everyday activities that can help me to get exercise?  [place] work, such as: ? Pushing a Conservation officer, nature. ? Raking and bagging leaves.  Washing and waxing your car.  Pushing a stroller.  Shoveling snow.  Gardening.  Washing windows or floors. How can I be more active in my day-to-day activities?  Use the stairs instead of the elevator.  Take a walk during your lunch break.  If you drive, park your car farther away from work or school.  If you take public transportation, get off one stop early and walk the rest of the way.  Make all of your phone calls while standing up and walking around.  Get up, stretch, and walk around  every 30 minutes throughout the day. What guidelines should I follow while exercising?  Do not exercise so much that you hurt yourself, feel dizzy, or get very short of breath.  Consult your health care provider before starting a new exercise program.  Wear comfortable clothes and shoes with good support.  Drink plenty of water while you exercise to prevent dehydration or heat stroke.  Body water is lost during exercise and must be replaced.  Work out until you breathe faster and your heart beats faster. This information is not intended to replace advice given to you by your health care provider. Make sure you discuss any questions you have with your health care provider. Document Released: 05/16/2010 Document Revised: 09/19/2015 Document Reviewed: 09/14/2013 Elsevier Interactive Patient Education  [employer].

## 2017-06-23 LAB — HEPATIC FUNCTION PANEL
AG RATIO: 1.4 (calc) (ref 1.0–2.5)
ALT: 14 U/L (ref 6–29)
AST: 16 U/L (ref 10–35)
Albumin: 3.7 g/dL (ref 3.6–5.1)
Alkaline phosphatase (APISO): 72 U/L (ref 33–130)
BILIRUBIN DIRECT: 0.1 mg/dL (ref 0.0–0.2)
BILIRUBIN INDIRECT: 0.3 mg/dL (ref 0.2–1.2)
BILIRUBIN TOTAL: 0.4 mg/dL (ref 0.2–1.2)
GLOBULIN: 2.7 g/dL (ref 1.9–3.7)
Total Protein: 6.4 g/dL (ref 6.1–8.1)

## 2017-06-23 LAB — CBC WITH DIFFERENTIAL/PLATELET
Basophils Absolute: 50 cells/uL (ref 0–200)
Basophils Relative: 1 %
EOS ABS: 300 {cells}/uL (ref 15–500)
Eosinophils Relative: 6 %
HEMATOCRIT: 38.6 % (ref 35.0–45.0)
Hemoglobin: 12.9 g/dL (ref 11.7–15.5)
LYMPHS ABS: 1700 {cells}/uL (ref 850–3900)
MCH: 30.5 pg (ref 27.0–33.0)
MCHC: 33.4 g/dL (ref 32.0–36.0)
MCV: 91.3 fL (ref 80.0–100.0)
MPV: 10.4 fL (ref 7.5–12.5)
Monocytes Relative: 8.2 %
Neutro Abs: 2540 cells/uL (ref 1500–7800)
Neutrophils Relative %: 50.8 %
PLATELETS: 279 10*3/uL (ref 140–400)
RBC: 4.23 10*6/uL (ref 3.80–5.10)
RDW: 11.9 % (ref 11.0–15.0)
TOTAL LYMPHOCYTE: 34 %
WBC: 5 10*3/uL (ref 3.8–10.8)
WBCMIX: 410 {cells}/uL (ref 200–950)

## 2017-06-23 LAB — BASIC METABOLIC PANEL WITH GFR
BUN / CREAT RATIO: 25 (calc) — AB (ref 6–22)
BUN: 30 mg/dL — ABNORMAL HIGH (ref 7–25)
CALCIUM: 9.6 mg/dL (ref 8.6–10.4)
CO2: 30 mmol/L (ref 20–32)
CREATININE: 1.22 mg/dL — AB (ref 0.50–0.99)
Chloride: 105 mmol/L (ref 98–110)
GFR, EST AFRICAN AMERICAN: 54 mL/min/{1.73_m2} — AB (ref >=60)
GFR, EST NON AFRICAN AMERICAN: 46 mL/min/{1.73_m2} — AB (ref >=60)
Glucose, Bld: 82 mg/dL (ref 65–99)
Potassium: 4.4 mmol/L (ref 3.5–5.3)
Sodium: 140 mmol/L (ref 135–146)

## 2017-06-23 LAB — LIPID PANEL
CHOL/HDL RATIO: 3 (calc) (ref <5.0)
CHOLESTEROL: 167 mg/dL (ref <200)
HDL: 55 mg/dL (ref >50)
LDL CHOLESTEROL (CALC): 93 mg/dL
NON-HDL CHOLESTEROL (CALC): 112 mg/dL (ref <130)
Triglycerides: 95 mg/dL (ref <150)

## 2017-06-23 LAB — TSH: TSH: 0.06 mIU/L — ABNORMAL LOW (ref 0.40–4.50)

## 2017-06-24 ENCOUNTER — Encounter: Payer: Self-pay | Admitting: Adult Health

## 2017-07-05 DIAGNOSIS — H524 Presbyopia: Secondary | ICD-10-CM | POA: Diagnosis not present

## 2017-07-05 DIAGNOSIS — H2513 Age-related nuclear cataract, bilateral: Secondary | ICD-10-CM | POA: Diagnosis not present

## 2017-07-22 ENCOUNTER — Ambulatory Visit (INDEPENDENT_AMBULATORY_CARE_PROVIDER_SITE_OTHER): Payer: PPO

## 2017-07-22 DIAGNOSIS — N1831 Chronic kidney disease, stage 3a: Secondary | ICD-10-CM

## 2017-07-22 DIAGNOSIS — E039 Hypothyroidism, unspecified: Secondary | ICD-10-CM

## 2017-07-22 DIAGNOSIS — Z79899 Other long term (current) drug therapy: Secondary | ICD-10-CM | POA: Diagnosis not present

## 2017-07-22 DIAGNOSIS — R300 Dysuria: Secondary | ICD-10-CM | POA: Diagnosis not present

## 2017-07-22 DIAGNOSIS — N183 Chronic kidney disease, stage 3 (moderate): Secondary | ICD-10-CM | POA: Diagnosis not present

## 2017-07-22 DIAGNOSIS — N390 Urinary tract infection, site not specified: Secondary | ICD-10-CM | POA: Diagnosis not present

## 2017-07-22 LAB — TSH: TSH: 4.53 m[IU]/L — AB (ref 0.40–4.50)

## 2017-07-22 NOTE — Progress Notes (Signed)
Patient presents to the office for a nurse visit to have TSH checked. Patient states that she is currently taking her Levothyroxine, 1 tablet, 6 days a week. Patient not taking it the way that it was changed to previously. Patient also had a UA,Culture & BMP w/GFR done due to having UTI symptoms. This is a recurrent issue.

## 2017-07-23 ENCOUNTER — Other Ambulatory Visit: Payer: Self-pay | Admitting: Adult Health

## 2017-07-23 ENCOUNTER — Encounter: Payer: Self-pay | Admitting: Adult Health

## 2017-07-23 DIAGNOSIS — N390 Urinary tract infection, site not specified: Secondary | ICD-10-CM

## 2017-07-23 DIAGNOSIS — R300 Dysuria: Secondary | ICD-10-CM

## 2017-07-23 LAB — URINE CULTURE
MICRO NUMBER:: 90390015
SPECIMEN QUALITY:: ADEQUATE

## 2017-07-23 LAB — URINALYSIS, ROUTINE W REFLEX MICROSCOPIC
Bacteria, UA: NONE SEEN /HPF
Bilirubin Urine: NEGATIVE
GLUCOSE, UA: NEGATIVE
Hgb urine dipstick: NEGATIVE
Hyaline Cast: NONE SEEN /LPF
KETONES UR: NEGATIVE
Nitrite: NEGATIVE
Protein, ur: NEGATIVE
SPECIFIC GRAVITY, URINE: 1.022 (ref 1.001–1.03)

## 2017-07-23 LAB — BASIC METABOLIC PANEL WITH GFR
BUN/Creatinine Ratio: 22 (calc) (ref 6–22)
BUN: 27 mg/dL — ABNORMAL HIGH (ref 7–25)
CHLORIDE: 106 mmol/L (ref 98–110)
CO2: 29 mmol/L (ref 20–32)
CREATININE: 1.22 mg/dL — AB (ref 0.50–0.99)
Calcium: 9.1 mg/dL (ref 8.6–10.4)
GFR, Est African American: 53 mL/min/{1.73_m2} — ABNORMAL LOW (ref >=60)
GFR, Est Non African American: 46 mL/min/{1.73_m2} — ABNORMAL LOW (ref >=60)
GLUCOSE: 87 mg/dL (ref 65–99)
Potassium: 4.4 mmol/L (ref 3.5–5.3)
Sodium: 141 mmol/L (ref 135–146)

## 2017-07-23 MED ORDER — SULFAMETHOXAZOLE-TRIMETHOPRIM 800-160 MG PO TABS: 1.0000 | ORAL_TABLET | Freq: Two times a day (BID) | ORAL | 0 refills | Status: DC

## 2017-07-25 ENCOUNTER — Other Ambulatory Visit: Payer: Self-pay | Admitting: Adult Health

## 2017-08-03 DIAGNOSIS — E785 Hyperlipidemia, unspecified: Secondary | ICD-10-CM | POA: Diagnosis not present

## 2017-08-03 DIAGNOSIS — R7303 Prediabetes: Secondary | ICD-10-CM | POA: Diagnosis not present

## 2017-08-03 DIAGNOSIS — N183 Chronic kidney disease, stage 3 (moderate): Secondary | ICD-10-CM | POA: Diagnosis not present

## 2017-08-03 DIAGNOSIS — J439 Emphysema, unspecified: Secondary | ICD-10-CM | POA: Diagnosis not present

## 2017-08-03 DIAGNOSIS — N179 Acute kidney failure, unspecified: Secondary | ICD-10-CM | POA: Diagnosis not present

## 2017-08-03 DIAGNOSIS — R809 Proteinuria, unspecified: Secondary | ICD-10-CM | POA: Diagnosis not present

## 2017-08-03 DIAGNOSIS — Z87440 Personal history of urinary (tract) infections: Secondary | ICD-10-CM | POA: Diagnosis not present

## 2017-08-03 DIAGNOSIS — I129 Hypertensive chronic kidney disease with stage 1 through stage 4 chronic kidney disease, or unspecified chronic kidney disease: Secondary | ICD-10-CM | POA: Diagnosis not present

## 2017-08-03 DIAGNOSIS — N390 Urinary tract infection, site not specified: Secondary | ICD-10-CM | POA: Diagnosis not present

## 2017-08-03 MED FILL — raNITIdine HCL 300 MG TABS: 300 | 90 days supply | Qty: 180 | Fill #1

## 2017-08-03 MED FILL — SERTRALINE HCL 100 MG TAB: 100 | 90 days supply | Qty: 90 | Fill #1

## 2017-08-19 ENCOUNTER — Emergency Department (HOSPITAL_COMMUNITY): Payer: PPO

## 2017-08-19 ENCOUNTER — Emergency Department (HOSPITAL_COMMUNITY)
Admission: EM | Admit: 2017-08-19 | Discharge: 2017-08-19 | Disposition: A | Discharge status: Home / Self Care | Payer: PPO | Attending: Emergency Medicine | Admitting: Emergency Medicine

## 2017-08-19 ENCOUNTER — Other Ambulatory Visit: Payer: Self-pay

## 2017-08-19 ENCOUNTER — Encounter (HOSPITAL_COMMUNITY): Payer: Self-pay

## 2017-08-19 ENCOUNTER — Other Ambulatory Visit: Payer: Self-pay | Admitting: Internal Medicine

## 2017-08-19 DIAGNOSIS — Z87891 Personal history of nicotine dependence: Secondary | ICD-10-CM | POA: Insufficient documentation

## 2017-08-19 DIAGNOSIS — N183 Chronic kidney disease, stage 3 (moderate): Secondary | ICD-10-CM | POA: Insufficient documentation

## 2017-08-19 DIAGNOSIS — S8292XA Unspecified fracture of left lower leg, initial encounter for closed fracture: Secondary | ICD-10-CM | POA: Diagnosis not present

## 2017-08-19 DIAGNOSIS — S82452A Displaced comminuted fracture of shaft of left fibula, initial encounter for closed fracture: Secondary | ICD-10-CM | POA: Insufficient documentation

## 2017-08-19 DIAGNOSIS — S82302A Unspecified fracture of lower end of left tibia, initial encounter for closed fracture: Secondary | ICD-10-CM | POA: Diagnosis not present

## 2017-08-19 DIAGNOSIS — S8252XA Displaced fracture of medial malleolus of left tibia, initial encounter for closed fracture: Secondary | ICD-10-CM | POA: Diagnosis not present

## 2017-08-19 DIAGNOSIS — W109XXA Fall (on) (from) unspecified stairs and steps, initial encounter: Secondary | ICD-10-CM | POA: Diagnosis not present

## 2017-08-19 DIAGNOSIS — Z79899 Other long term (current) drug therapy: Secondary | ICD-10-CM | POA: Diagnosis not present

## 2017-08-19 DIAGNOSIS — S82892A Other fracture of left lower leg, initial encounter for closed fracture: Secondary | ICD-10-CM

## 2017-08-19 DIAGNOSIS — S9305XA Dislocation of left ankle joint, initial encounter: Secondary | ICD-10-CM | POA: Insufficient documentation

## 2017-08-19 DIAGNOSIS — Z91040 Latex allergy status: Secondary | ICD-10-CM | POA: Diagnosis not present

## 2017-08-19 DIAGNOSIS — S82832A Other fracture of upper and lower end of left fibula, initial encounter for closed fracture: Secondary | ICD-10-CM | POA: Diagnosis not present

## 2017-08-19 DIAGNOSIS — S99912A Unspecified injury of left ankle, initial encounter: Secondary | ICD-10-CM | POA: Diagnosis present

## 2017-08-19 DIAGNOSIS — Y999 Unspecified external cause status: Secondary | ICD-10-CM | POA: Insufficient documentation

## 2017-08-19 DIAGNOSIS — Y929 Unspecified place or not applicable: Secondary | ICD-10-CM | POA: Insufficient documentation

## 2017-08-19 DIAGNOSIS — S8262XA Displaced fracture of lateral malleolus of left fibula, initial encounter for closed fracture: Secondary | ICD-10-CM | POA: Diagnosis not present

## 2017-08-19 DIAGNOSIS — Y939 Activity, unspecified: Secondary | ICD-10-CM | POA: Insufficient documentation

## 2017-08-19 MED ORDER — MORPHINE SULFATE (PF) 4 MG/ML IV SOLN
4.0000 mg | Freq: Once | INTRAVENOUS | Status: AC
Start: 2017-08-19 — End: 2017-08-19
  Administered 2017-08-19: 4 mg via INTRAVENOUS
  Filled 2017-08-19: qty 1

## 2017-08-19 MED ORDER — ONDANSETRON HCL 4 MG/2ML IJ SOLN
4.0000 mg | Freq: Once | INTRAMUSCULAR | Status: AC
  Administered 2017-08-19: 4 mg via INTRAVENOUS
  Filled 2017-08-19: qty 2

## 2017-08-19 MED ORDER — HYDROCODONE-ACETAMINOPHEN 5-325 MG PO TABS: 1.0000 | ORAL_TABLET | ORAL | 0 refills | Status: DC | PRN

## 2017-08-19 MED ORDER — ETOMIDATE 2 MG/ML IV SOLN
0.1000 mg/kg | Freq: Once | INTRAVENOUS | Status: AC
  Administered 2017-08-19: 7.22 mg via INTRAVENOUS
  Filled 2017-08-19: qty 10

## 2017-08-19 MED FILL — LEVOTHYROXINE 125 MCG TABLE: 125 | 90 days supply | Qty: 90 | Fill #1

## 2017-08-19 MED FILL — DICYCLOMINE 10 MG CAPSULE: 10 | 90 days supply | Qty: 270 | Fill #0

## 2017-08-19 NOTE — ED Notes (Signed)
This nurse removed pt's IV in L forearm for d/c.  No redness, swelling or edema at site.  It was clean, dry, with the catheter intact.

## 2017-08-19 NOTE — ED Notes (Signed)
[phone] Cecille Rubin- daughter

## 2017-08-19 NOTE — ED Triage Notes (Signed)
Pt arrived with granddtr and reports that she had a fall today, while she was going down the stairs in her home pt has pain to her left ankle 8/10 pain area appears to be deformed + PMS, and swelling and bruising is noted.

## 2017-08-19 NOTE — ED Notes (Signed)
Bed: WA19 Expected date:  Expected time:  Means of arrival:  Comments: triage 

## 2017-08-20 NOTE — ED Provider Notes (Signed)
[facility] DEPT Provider Note   CSN: [id number] Arrival date & time: 08/19/17  1648     History   Chief Complaint Chief Complaint  Patient presents with  . Fall  . Leg Injury    HPI [name] is a 67 y.o. female.  HPI Patient presents after fall.  Left ankle injury and deformity.  States she just missed a step.  No head neck or upper extremity pain.  Not on anticoagulation.  No abdominal pain or chest pain. Past Medical History:  Diagnosis Date  . Diverticulosis   . Emphysema lung ([place])   . Helicobacter pylori (H. pylori)   . History of IBS   . IBS (irritable bowel syndrome)   . Migraines   . OCD (obsessive compulsive disorder)   . Prediabetes   . Renal disorder    AKI due to lisinpril, his. pyelonephritis,   . Sepsis ([place])   . Vitamin D deficiency     Patient Active Problem List   Diagnosis Date Noted  . Lower urinary tract infection 06/21/2017  . Thrombocytopenia ([place]) 06/19/2015  . CKD Stage 3 (GFR 55 ml/min) 01/10/2015  . IBS  01/10/2015  . Mixed hyperlipidemia 06/12/2014  . Medication management 06/12/2014  . LBBB (left bundle branch block) 07/24/2013  . HTN (hypertension) 07/24/2013  . Depression, controlled   . Diverticulosis   . ADHD (attention deficit hyperactivity disorder)   . OCD (obsessive compulsive disorder)   . Other abnormal glucose   . Migraines   . Vitamin D deficiency   . Hypothyroidism 04/05/2013    Past Surgical History:  Procedure Laterality Date  . BREAST ENHANCEMENT SURGERY    . BUNIONECTOMY     Bil  . COLON SURGERY     colonoscopy  . HEMORRHOID SURGERY    . OOPHORECTOMY    . TONSILLECTOMY       OB History   None      Home Medications    Prior to Admission medications   Medication Sig Start Date End Date Taking? Authorizing Provider  acetaminophen (TYLENOL) 500 MG tablet Take 500 mg by mouth every 6 (six) hours as needed for pain.   Yes [provider]  ALPRAZolam  Duanne Moron) 0.5 MG tablet Take 1/2 to 1 tablet 3 x day if needed for anxiety or sleep 04/15/14  Yes Unk [name], MD  Ascorbic Acid (VITAMIN C PO) Take 500 mg by mouth daily.    Yes [provider]  aspirin EC 81 MG tablet Take 81 mg by mouth daily.   Yes [provider]  atenolol (TENORMIN) 100 MG tablet TAKE 1 TABLET BY MOUTH DAILY 02/26/17  Yes Unk [name], MD  buPROPion (WELLBUTRIN XL) 150 MG 24 hr tablet TAKE 3 TABLETS BY MOUTH EVERY MORNING FOR MOOD/DEPRESSION 05/18/17  Yes Liane Comber, NP  Cholecalciferol (VITAMIN D-3) 5000 UNITS TABS Take 5,000 Units by mouth daily.    Yes [provider]  DICYCLOMINE HCL PO Take 10 mg by mouth 2 (two) times daily.    Yes [provider]  docusate sodium (COLACE) 100 MG capsule Take 100 mg by mouth 2 (two) times daily as needed for mild constipation.   Yes [provider]  hyoscyamine (LEVSIN SL) 0.125 MG SL tablet PLACE 1 TABLET (0.125 MG TOTAL) UNDER THE TONGUE EVERY 4 (FOUR) HOURS AS NEEDED. 04/26/17  Yes Unk [name], MD  levothyroxine (SYNTHROID, LEVOTHROID) 125 MCG tablet TAKE 1 TABLET BY MOUTH ONCE DAILY 05/18/17  Yes Corbett,  Caryl Pina, NP  Magnesium 250 MG TABS Take 250 mg by mouth daily.   Yes [provider]  meclizine (ANTIVERT) 25 MG tablet TAKE 1 TABLET BY MOUTH 3 TIMES DAILY AS NEEDED FOR NAUSEA 03/08/17  Yes Unk [name], MD  Multiple Vitamins-Minerals (MULTIVITAMIN WITH MINERALS) tablet Take 1 tablet by mouth daily.   Yes [provider]  ondansetron (ZOFRAN) 8 MG tablet TAKE 1 TABLET 3 x  DAILY AS NEEDED FOR NAUSEA 12/22/16  Yes Unk [name], MD  ranitidine (ZANTAC) 300 MG tablet Take 1 to 2 tablets daily for heartburn & reflux to allow wean and transition from PPI / pantoprazole 09/01/16  Yes Unk [name], MD  sertraline (ZOLOFT) 100 MG tablet TAKE 1 TABLET BY MOUTH ONCE DAILY FOR MOOD AND DEPRESSION 04/26/17  Yes Unk [name], MD  dicyclomine (BENTYL) 10 MG  capsule TAKE 1 CAPSULE BY MOUTH 3 TIMES DAILY BEFORE MEALS Patient taking differently: TAKE 1 CAPSULE BY MOUTH 3 TIMES DAILY BEFORE MEALS as needed 08/19/17   Unk [name], MD  doxycycline (VIBRAMYCIN) 100 MG capsule Take 1 capsule 2 x/day with food for 10 days 06/22/17   Liane Comber, NP  HYDROcodone-acetaminophen (NORCO/VICODIN) 5-325 MG tablet Take 1-2 tablets by mouth every 4 (four) hours as needed for moderate pain. 08/19/17   Davonna Belling, MD  methylphenidate (RITALIN) 20 MG tablet 1/2 to 1 tab 1 to 2 x / daily as needed for ADD or alertness 03/22/17 04/22/17  Unk [name], MD  pantoprazole (PROTONIX) 40 MG tablet Take 1 tablet (40 mg total) by mouth daily. Patient not taking: Reported on 06/22/2017 12/22/16 12/22/17  Unk [name], MD  trimethoprim (TRIMPEX) 100 MG tablet TAKE 1 TABLET BY MOUTH TWICE DAILY Patient not taking: Reported on 06/22/2017 01/07/17   Vicie Mutters, PA-C    Family History Family History  Problem Relation Age of Onset  . Colon polyps Mother   . Colon cancer Neg Hx     Social History Social History   Tobacco Use  . Smoking status: Former Smoker    Packs/day: 1.00    Years: 40.00    Pack years: 40.00    Types: Cigarettes    Last attempt to quit: 04/27/2002    Years since quitting: 15.3  . Smokeless tobacco: Never Used  Substance Use Topics  . Alcohol use: No    Comment: ocassional  . Drug use: No     Allergies   Ace inhibitors; Cephalexin; Macrobid [nitrofurantoin]; Codeine; Latex; and Sulfa antibiotics   Review of Systems Review of Systems  Constitutional: Negative for appetite change.  HENT: Negative for congestion.   Respiratory: Negative for shortness of breath.   Gastrointestinal: Negative for abdominal distention.  Genitourinary: Negative for flank pain.  Musculoskeletal: Negative for back pain.       Left ankle pain. deformity  Skin: Negative for rash.  Neurological: Negative for weakness.  Psychiatric/Behavioral:  Negative for confusion.     Physical Exam Updated Vital Signs BP 110/69   Pulse (!) 55   Temp 97.8 F (36.6 C) (Oral)   Resp 16   Ht 5\' 4"  (1.626 m)   Wt 72.1 kg (159 lb)   SpO2 95%   BMI 27.29 kg/m   Physical Exam  Constitutional: She appears well-developed.  HENT:  Head: Atraumatic.  Eyes: EOM are normal.  Cardiovascular: Normal rate.  Pulmonary/Chest: Effort normal.  Abdominal: Soft.  Musculoskeletal: She exhibits deformity.  Deformity of left ankle.  Neurovascular intact in foot with strong dorsalis pedis pulse.  No tenderness over proximal fibula knee or ankle.  Neurological: She is alert.  Skin: Skin is warm. Capillary refill takes less than 2 seconds.         ED Treatments / Results  Labs (all labs ordered are listed, but only abnormal results are displayed) Labs Reviewed - No data to display  EKG None  Radiology Dg Tibia/fibula Left  Result Date: 08/19/2017 CLINICAL DATA:  Fall with deformity EXAM: LEFT TIBIA AND FIBULA - 2 VIEW COMPARISON:  None. FINDINGS: Proximal tibia and fibula appear intact. Comminuted displaced and angulated fracture distal fibula with multiple fracture fragments adjacent to the lateral malleolar tip. Acute comminuted medial malleolar fracture. Probable avulsion fractures off the anterior inferior cortex of the tibia. Dislocation at the ankle joint with lateral and posterior displacement of the talus with respect to the distal tibia. IMPRESSION: 1. Proximal fibula and tibia are intact. 2. Acute displaced and angulated fractures of the distal fibula and tibia with dislocation at the ankle joint. Electronically Signed   By: Donavan Foil M.D.   On: 08/19/2017 18:12   Dg Ankle Complete Left  Result Date: 08/19/2017 CLINICAL DATA:  S/p reduction left ankle EXAM: LEFT ANKLE COMPLETE - 3+ VIEW COMPARISON:  08/19/2017 FINDINGS: Status post reduction LEFT ankle. Ankle is imaged through splinting material. Detail is obscured. There has been  interval closed reduction of the LEFT ankle following bimalleolar fracture. Remote bunionectomy. IMPRESSION: Status post closed reduction of the LEFT ankle fracture. Electronically Signed   By: Nolon Nations M.D.   On: 08/19/2017 19:13   Dg Ankle Complete Left  Result Date: 08/19/2017 CLINICAL DATA:  [place] with ankle deformity EXAM: LEFT ANKLE COMPLETE - 3+ VIEW COMPARISON:  None. FINDINGS: Acute comminuted fracture involving the distal shaft and metaphysis of the fibula with mild to moderate lateral angulation of distal fracture fragment and separation of fracture fragments by about 1.2 cm on the lateral view. Acute displaced medial malleolar fracture. Dislocation at the ankle joint with posterior and lateral displacement of the talus with respect to the distal tibia and fibula. There is mild-to-moderate angulation of the ankle and foot with respect to the distal shafts of the fibula and tibia. Fixating screw within the first metatarsal. Multiple fracture fragments adjacent to the tip of the fibular malleolus. IMPRESSION: Acute comminuted fractures involving the distal fibula and tibia with dislocation of the ankle joint, talus is positioned lateral and posterior with respect to the distal tibia. Electronically Signed   By: Donavan Foil M.D.   On: 08/19/2017 18:09    Procedures .Ortho Injury Treatment Date/Time: 08/20/2017 12:17 AM Performed by: Davonna Belling, MD Authorized by: Davonna Belling, MD   Consent:    Consent obtained:  Written   Consent given by:  Patient   Risks discussed:  Fracture, nerve damage, restricted joint movement, irreducible dislocation and recurrent dislocation   Alternatives discussed:  No treatment, alternative treatment and immobilizationInjury location: ankle Location details: left ankle Injury type: fracture-dislocation Pre-procedure neurovascular assessment: neurovascularly intact Pre-procedure distal perfusion: normal Pre-procedure neurological function:  normal Pre-procedure range of motion: reduced  Anesthesia: Local anesthesia used: no  Patient sedated: Yes. Refer to sedation procedure documentation for details of sedation. Manipulation performed: yes Reduction successful: yes X-ray confirmed reduction: yes Immobilization: splint Splint type: ankle stirrup and short leg Supplies used: plaster Post-procedure neurovascular assessment: post-procedure neurovascularly intact Post-procedure distal perfusion: normal Post-procedure neurological function: normal Patient tolerance: Patient tolerated the procedure well with no immediate complications  Sedation procedure Date/Time: 08/20/2017 12:19  AM Performed by: Davonna Belling, MD Authorized by: Davonna Belling, MD   Consent:    Consent obtained:  Verbal   Consent given by:  Patient   Risks discussed:  Allergic reaction, dysrhythmia, inadequate sedation, nausea, respiratory compromise necessitating ventilatory assistance and intubation, prolonged sedation necessitating reversal and prolonged hypoxia resulting in organ damage   Alternatives discussed:  Analgesia without sedation Universal protocol:    Procedure explained and questions answered to patient or proxy's satisfaction: yes     Test results available and properly labeled: yes     Imaging studies available: yes     Immediately prior to procedure a time out was called: yes     Patient identity confirmation method:  Arm band and verbally with patient Indications:    Procedure performed:  Fracture reduction   Procedure necessitating sedation performed by:  Physician performing sedation   Intended level of sedation:  Moderate (conscious sedation) Pre-sedation assessment:    Time since last food or drink:  4hrs   ASA classification: class 2 - patient with mild systemic disease     Mallampati score:  III - soft palate, base of uvula visible   Pre-sedation assessments completed and reviewed: airway patency and cardiovascular  function   Immediate pre-procedure details:    Reassessment: Patient reassessed immediately prior to procedure     Reviewed: vital signs     Verified: bag valve mask available, emergency equipment available, intubation equipment available, IV patency confirmed and oxygen available   Procedure details (see MAR for exact dosages):    Preoxygenation:  Nasal cannula   Sedation:  Etomidate   Intra-procedure monitoring:  Blood pressure monitoring, cardiac monitor, continuous capnometry and continuous pulse oximetry   Intra-procedure events: none     Total Provider sedation time (minutes):  10 Post-procedure details:    Attendance: Constant attendance by certified staff until patient recovered     Post-sedation assessments completed and reviewed: airway patency and cardiovascular function     Patient is stable for discharge or admission: yes     Patient tolerance:  Tolerated well, no immediate complications Comments:     When patient woke up she stated I am still awake because she was worried that the procedure was going to be done before she was sedated.   (including critical care time)  Medications Ordered in ED Medications  ondansetron (ZOFRAN) injection 4 mg (4 mg Intravenous Given 08/19/17 1814)  morphine 4 MG/ML injection 4 mg (4 mg Intravenous Given 08/19/17 1814)  etomidate (AMIDATE) injection 7.22 mg (7.22 mg Intravenous Given 08/19/17 1842)     Initial Impression / Assessment and Plan / ED Course  I have reviewed the triage vital signs and the nursing notes.  Pertinent labs & imaging results that were available during my care of the patient were reviewed by me and considered in my medical decision making (see chart for details).     Patient with left ankle fracture.  Also dislocation.  Reduced under sedation.  Feels much better.  Discharged home to follow-up with Ortho.  Patient states she may want to see Dr. Sharol Given.  Final Clinical Impressions(s) / ED Diagnoses   Final  diagnoses:  Closed fracture of left ankle, initial encounter    ED Discharge Orders        Ordered    HYDROcodone-acetaminophen (NORCO/VICODIN) 5-325 MG tablet  Every 4 hours PRN     08/19/17 2019       Davonna Belling, MD 08/20/17 0021

## 2017-08-23 ENCOUNTER — Ambulatory Visit (INDEPENDENT_AMBULATORY_CARE_PROVIDER_SITE_OTHER): Payer: Self-pay | Admitting: Orthopaedic Surgery

## 2017-08-23 DIAGNOSIS — S82899A Other fracture of unspecified lower leg, initial encounter for closed fracture: Secondary | ICD-10-CM | POA: Insufficient documentation

## 2017-08-23 DIAGNOSIS — S8262XA Displaced fracture of lateral malleolus of left fibula, initial encounter for closed fracture: Secondary | ICD-10-CM | POA: Diagnosis not present

## 2017-08-23 DIAGNOSIS — S8252XA Displaced fracture of medial malleolus of left tibia, initial encounter for closed fracture: Secondary | ICD-10-CM | POA: Diagnosis not present

## 2017-08-23 NOTE — Progress Notes (Signed)
Assessment and Plan:  [name] was seen today for cough, medical clearance and dysuria.  Diagnoses and all orders for this visit:   Closed fracture of left ankle with routine healing, subsequent encounter Presents for surgical clearance today; she presents with suspected UTI and ?asmatic bronchitis/mild COPD exacerbation; will treat and have her follow up in 2 days on 08/26/2017 for reevaluation but will hold off on providing surgical clearance at this time. Surgery may need to be postponed for a few days from 5/6 unless significantly improved at follow up appt.   Mild intermittent asthmatic bronchitis with acute exacerbation Samples of trelegy ellipta provided with instructions on use -     doxycycline (VIBRAMYCIN) 100 MG capsule; Take 1 capsule 2 x/day with food for 10 days -     predniSONE (DELTASONE) 20 MG tablet; 2 tablets daily for 3 days, 1 tablet daily for 4 days. -     promethazine-dextromethorphan (PROMETHAZINE-DM) 6.25-15 MG/5ML syrup; Take 5 mLs by mouth 4 (four) times daily as needed for cough. Follow up in 2 days for reevaluation prior to surgery  Dysuria/ Recurrent urinary tract infection -     Urinalysis w microscopic + reflex cultur -     doxycycline (VIBRAMYCIN) 100 MG capsule; Take 1 capsule 2 x/day with food for 10 days  Further disposition pending results of labs. Discussed med's effects and SE's.   Over 30 minutes of exam, counseling, chart review, and critical decision making was performed.   Future Appointments  Date Time Provider [place]  08/26/2017  2:30 PM Liane Comber, NP [facility] None  08/26/2017  4:30 PM GI-315 CT 1 [facility]-315[facility]  09/27/2017  2:30 PM Unk [name], MD [facility] None  05/02/2018  2:00 PM Unk [name], MD [facility] None  06/22/2018  2:30 PM Liane Comber, NP [facility] None    ------------------------------------------------------------------------------------------------------------------   HPI BP 109/62   Pulse  (!) 57   Temp (!) 97.5 F (36.4 C)   Resp 16   SpO2 98%   67 y.o.female presents in wheelchair accompanied by a friend; she is a former smoker (quit in 2004), reports hx of emphysema though not currently on treatment and not followed by pulmonology, presents for non-productive cough and wheezing that began ~2 weeks ago after working out in her yard, and seems to have progressed to chest congestion in the last few days. She does endorse mild dyspnea with wheezing. Denies chest pain, dizziness. She is able to speak in complete sentences, not in any apparent distress. She has tried some old nebulized albuterol that she has from 4 years ago, reports has not helped. She is not on allergy medication. Has not tried other interventions.    She also c/o "UTI symptoms" x 1 week - has hx of recurrent UTIs (e. Coli), reports dysuria, frequency, cloudy/odor x 1 week. Has been pushing fluids without improvement. She also has late stage 3 CKD, is followed closely by nephrology; recently improved with most recent BMP on 3/28 demonstrating BUN 27, Cr 1.22 and GFR of 46. She has multiple abx allergies, but has tolerated and responded to doxycycline in the past for UTIs.   She also presents today for surgical clearance; she recently had a mechanical fall at home 08/19/2017, fell off a step and fractured fibula and tibia of left ankle. Area was displaced but closed. Was reduced under sedation in [facility] ED and has followed up on outpatient basis with Dr. Lyla Glassing, who has scheduled her for ORIF on 08/30/2017. She  is struggling with mobility and has requested a wheelchair and commode for her home which has been ordered prior to this visit by our office.    Past Medical History:  Diagnosis Date  . Diverticulosis   . Emphysema lung ([place])   . Helicobacter pylori (H. pylori)   . History of IBS   . IBS (irritable bowel syndrome)   . Migraines   . OCD (obsessive compulsive disorder)   . Prediabetes   . Renal disorder     AKI due to lisinpril, his. pyelonephritis,   . Sepsis ([place])   . Vitamin D deficiency      Allergies  Allergen Reactions  . Ace Inhibitors Other (See Comments)    Acute renal failure (lisinopril)  . Cephalexin Anaphylaxis  . Macrobid [Nitrofurantoin] Other (See Comments)    Causes hyperthermia   . Codeine Nausea Only  . Latex Rash  . Sulfa Antibiotics     Do not prescribe per nephrology    Current Outpatient Medications on File Prior to Visit  Medication Sig  . acetaminophen (TYLENOL) 500 MG tablet Take 500 mg by mouth every 6 (six) hours as needed for pain.  Marland Kitchen ALPRAZolam (XANAX) 0.5 MG tablet Take 1/2 to 1 tablet 3 x day if needed for anxiety or sleep  . Ascorbic Acid (VITAMIN C PO) Take 500 mg by mouth daily.   Marland Kitchen aspirin EC 81 MG tablet Take 81 mg by mouth daily.  Marland Kitchen atenolol (TENORMIN) 100 MG tablet TAKE 1 TABLET BY MOUTH DAILY  . buPROPion (WELLBUTRIN XL) 150 MG 24 hr tablet TAKE 3 TABLETS BY MOUTH EVERY MORNING FOR MOOD/DEPRESSION  . Cholecalciferol (VITAMIN D-3) 5000 UNITS TABS Take 5,000 Units by mouth daily.   Marland Kitchen DICYCLOMINE HCL PO Take 10 mg by mouth 2 (two) times daily.   Marland Kitchen docusate sodium (COLACE) 100 MG capsule Take 100 mg by mouth 2 (two) times daily as needed for mild constipation.  Marland Kitchen HYDROcodone-acetaminophen (NORCO/VICODIN) 5-325 MG tablet Take 1-2 tablets by mouth every 4 (four) hours as needed for moderate pain.  . hyoscyamine (LEVSIN SL) 0.125 MG SL tablet PLACE 1 TABLET (0.125 MG TOTAL) UNDER THE TONGUE EVERY 4 (FOUR) HOURS AS NEEDED.  Marland Kitchen levothyroxine (SYNTHROID, LEVOTHROID) 125 MCG tablet TAKE 1 TABLET BY MOUTH ONCE DAILY  . Magnesium 250 MG TABS Take 250 mg by mouth daily.  . meclizine (ANTIVERT) 25 MG tablet TAKE 1 TABLET BY MOUTH 3 TIMES DAILY AS NEEDED FOR NAUSEA  . Multiple Vitamins-Minerals (MULTIVITAMIN WITH MINERALS) tablet Take 1 tablet by mouth daily.  . ondansetron (ZOFRAN) 8 MG tablet TAKE 1 TABLET 3 x  DAILY AS NEEDED FOR NAUSEA  . ranitidine  (ZANTAC) 300 MG tablet Take 1 to 2 tablets daily for heartburn & reflux to allow wean and transition from PPI / pantoprazole  . sertraline (ZOLOFT) 100 MG tablet TAKE 1 TABLET BY MOUTH ONCE DAILY FOR MOOD AND DEPRESSION  . dicyclomine (BENTYL) 10 MG capsule TAKE 1 CAPSULE BY MOUTH 3 TIMES DAILY BEFORE MEALS (Patient not taking: Reported on 08/24/2017)  . methylphenidate (RITALIN) 20 MG tablet 1/2 to 1 tab 1 to 2 x / daily as needed for ADD or alertness  . pantoprazole (PROTONIX) 40 MG tablet Take 1 tablet (40 mg total) by mouth daily. (Patient not taking: Reported on 08/24/2017)  . trimethoprim (TRIMPEX) 100 MG tablet TAKE 1 TABLET BY MOUTH TWICE DAILY (Patient not taking: Reported on 08/24/2017)   No current facility-administered medications on file prior to visit.  ROS: Review of Systems  Constitutional: Negative for chills, diaphoresis, fever and malaise/fatigue.  HENT: Negative for congestion, ear discharge, ear pain, hearing loss, sinus pain, sore throat and tinnitus.   Eyes: Negative for blurred vision, pain, discharge and redness.  Respiratory: Positive for cough, shortness of breath and wheezing. Negative for hemoptysis, sputum production and stridor.   Cardiovascular: Negative for chest pain, palpitations and orthopnea.  Gastrointestinal: Negative for abdominal pain, diarrhea, nausea and vomiting.  Genitourinary: Positive for dysuria and frequency. Negative for flank pain, hematuria and urgency.  Musculoskeletal: Positive for falls. Negative for joint pain and myalgias.  Skin: Negative for rash.  Neurological: Negative for dizziness, sensory change, weakness and headaches.  Endo/Heme/Allergies: Negative for environmental allergies.  Psychiatric/Behavioral: Negative.   All other systems reviewed and are negative.   Physical Exam:  BP 109/62   Pulse (!) 57   Temp (!) 97.5 F (36.4 C)   Resp 16   SpO2 98%   General Appearance: Well nourished, in no acute distress. Eyes:  PERRLA, EOMs, conjunctiva no swelling or erythema Sinuses: No Frontal/maxillary tenderness ENT/Mouth: Ext aud canals clear, TMs without erythema, bulging. No erythema, swelling, or exudate on post pharynx.  Tonsils not swollen or erythematous. Hearing normal.  Neck: Supple.  Respiratory: Respiratory effort normal, frequent reactive cough with attempts to deep breathe, BS present throughout with scant end-expiratory wheezing and scant crackles that clear with cough in bilateral bases without rhonchi or stridor.  Cardio: RRR with no MRGs.  Abdomen: Soft, + BS.  Non tender, no guarding, rebound, hernias, masses. Musculoskeletal: left lower extremity in dressing, patient in wheelchair  Skin: Warm, dry without visible rashes, lesions Psych: Awake and oriented X 3, normal affect, Insight and Judgment appropriate.     Izora Ribas, NP 12:55 PM [facility]

## 2017-08-24 ENCOUNTER — Encounter: Payer: Self-pay | Admitting: Adult Health

## 2017-08-24 ENCOUNTER — Ambulatory Visit: Payer: Self-pay | Admitting: Orthopedic Surgery

## 2017-08-24 ENCOUNTER — Other Ambulatory Visit: Payer: Self-pay | Admitting: Orthopedic Surgery

## 2017-08-24 ENCOUNTER — Ambulatory Visit (INDEPENDENT_AMBULATORY_CARE_PROVIDER_SITE_OTHER): Payer: PPO | Admitting: Adult Health

## 2017-08-24 VITALS — BP 109/62 | HR 57 | Temp 97.5°F | Resp 16

## 2017-08-24 DIAGNOSIS — S82892A Other fracture of left lower leg, initial encounter for closed fracture: Secondary | ICD-10-CM | POA: Diagnosis not present

## 2017-08-24 DIAGNOSIS — N390 Urinary tract infection, site not specified: Secondary | ICD-10-CM

## 2017-08-24 DIAGNOSIS — R300 Dysuria: Secondary | ICD-10-CM | POA: Diagnosis not present

## 2017-08-24 DIAGNOSIS — S8262XA Displaced fracture of lateral malleolus of left fibula, initial encounter for closed fracture: Secondary | ICD-10-CM

## 2017-08-24 DIAGNOSIS — S8255XA Nondisplaced fracture of medial malleolus of left tibia, initial encounter for closed fracture: Secondary | ICD-10-CM

## 2017-08-24 DIAGNOSIS — J4521 Mild intermittent asthma with (acute) exacerbation: Secondary | ICD-10-CM

## 2017-08-24 DIAGNOSIS — S82892D Other fracture of left lower leg, subsequent encounter for closed fracture with routine healing: Secondary | ICD-10-CM | POA: Diagnosis not present

## 2017-08-24 MED ORDER — PREDNISONE 20 MG PO TABS: ORAL_TABLET | ORAL | 0 refills | Status: DC

## 2017-08-24 MED ORDER — DOXYCYCLINE HYCLATE 100 MG PO CAPS: ORAL_CAPSULE | ORAL | 0 refills | Status: DC

## 2017-08-24 MED ORDER — PROMETHAZINE-DM 6.25-15 MG/5ML PO SYRP: 5.0000 mL | ORAL_SOLUTION | Freq: Four times a day (QID) | ORAL | 1 refills | Status: DC | PRN

## 2017-08-24 MED FILL — DOXYCYCLINE HYCLATE 100 MG: 100 | 10 days supply | Qty: 20 | Fill #0

## 2017-08-24 MED FILL — predniSONE 20 MG TABS: 20 | 7 days supply | Qty: 10 | Fill #0

## 2017-08-24 MED FILL — PROMETHAZINE-DM SYRUP: 6.25-15 | 12 days supply | Qty: 240 | Fill #0

## 2017-08-25 DIAGNOSIS — S82843A Displaced bimalleolar fracture of unspecified lower leg, initial encounter for closed fracture: Secondary | ICD-10-CM | POA: Insufficient documentation

## 2017-08-25 DIAGNOSIS — S82892A Other fracture of left lower leg, initial encounter for closed fracture: Secondary | ICD-10-CM | POA: Diagnosis not present

## 2017-08-25 LAB — URINALYSIS W MICROSCOPIC + REFLEX CULTURE
BILIRUBIN URINE: NEGATIVE
Bacteria, UA: NONE SEEN /HPF
GLUCOSE, UA: NEGATIVE
HGB URINE DIPSTICK: NEGATIVE
Hyaline Cast: NONE SEEN /LPF
Ketones, ur: NEGATIVE
NITRITES URINE, INITIAL: NEGATIVE
PROTEIN: NEGATIVE
RBC / HPF: NONE SEEN /HPF (ref 0–2)
SQUAMOUS EPITHELIAL / LPF: NONE SEEN /HPF (ref <=5)
Specific Gravity, Urine: 1.022 (ref 1.001–1.03)
pH: 7 (ref 5.0–8.0)

## 2017-08-25 LAB — CULTURE INDICATED

## 2017-08-25 NOTE — Pre-Procedure Instructions (Signed)
[name]  08/25/2017      [place], [place] [address] [place] [address] Phone: [phone] Fax: [phone]    Your procedure is scheduled on Aug 30, 2017.  Report to [facility] Admitting at [place].  Call this number if you have problems the morning of surgery:  [phone]   Remember:  Do not eat food or drink liquids after midnight.  Take these medicines the morning of surgery with A SIP OF WATER Acetaminophen (Tylenol) if needed, Alprazolam (Xanax) if needed, Atenolol (Tenormin), Bupropion (Wellbutrin), Dicyclomine, Doxycycline (Vibramycin), Hydrocodone-Acetaminophen (Norco/Vicodin) if needed, Hyoscyamine (Levsin) if needed, Levothyroxine (Synthroid), Meclizine (Antivert) if needed, Ondansetron (Zofran) if needed, Pantoprazole (Protonix), Prednisone (Deltasone), Sertraline (Zoloft).  7 days prior to surgery STOP taking any Aspirin(unless otherwise instructed by your surgeon), Aleve, Naproxen, Ibuprofen, Motrin, Advil, Goody's, BC's, all herbal medications, fish oil, and all vitamins   Do not wear jewelry, make-up or nail polish.  Do not wear lotions, powders, or perfumes, or deodorant.  Do not shave 48 hours prior to surgery.  Do not bring valuables to the hospital.  [facility] is not responsible for any belongings or valuables.  Contacts, dentures or bridgework may not be worn into surgery.  Leave your suitcase in the car.  After surgery it may be brought to your room.  For patients admitted to the hospital, discharge time will be determined by your treatment team.  Patients discharged the day of surgery will not be allowed to drive home.   [place]- Preparing For Surgery  Before surgery, you can play an important role. Because skin is not sterile, your skin needs to be as free of germs as possible. You can reduce the number of germs on your skin by washing with CHG (chlorahexidine  gluconate) Soap before surgery.  CHG is an antiseptic cleaner which kills germs and bonds with the skin to continue killing germs even after washing.  Please do not use if you have an allergy to CHG or antibacterial soaps. If your skin becomes reddened/irritated stop using the CHG.  Do not shave (including legs and underarms) for at least 48 hours prior to first CHG shower. It is OK to shave your face.  Please follow these instructions carefully.   1. Shower the NIGHT BEFORE SURGERY and the MORNING OF SURGERY with CHG.   2. If you chose to wash your hair, wash your hair first as usual with your normal shampoo.  3. After you shampoo, rinse your hair and body thoroughly to remove the shampoo.  4. Use CHG as you would any other liquid soap. You can apply CHG directly to the skin and wash gently with a scrungie or a clean washcloth.   5. Apply the CHG Soap to your body ONLY FROM THE NECK DOWN.  Do not use on open wounds or open sores. Avoid contact with your eyes, ears, mouth and genitals (private parts). Wash Face and genitals (private parts)  with your normal soap.  6. Wash thoroughly, paying special attention to the area where your surgery will be performed.  7. Thoroughly rinse your body with warm water from the neck down.  8. DO NOT shower/wash with your normal soap after using and rinsing off the CHG Soap.  9. Pat yourself dry with a CLEAN TOWEL.  10. Wear CLEAN PAJAMAS to bed the night before surgery, wear comfortable clothes the morning of surgery  11. Place CLEAN SHEETS on your bed the night of your first shower and DO NOT SLEEP WITH PETS.    Day of Surgery: Do not apply any deodorants/lotions. Please wear clean clothes to the hospital/surgery center.

## 2017-08-26 ENCOUNTER — Encounter: Payer: Self-pay | Admitting: Adult Health

## 2017-08-26 ENCOUNTER — Ambulatory Visit (HOSPITAL_COMMUNITY)
Admission: RE | Admit: 2017-08-26 | Discharge: 2017-08-26 | Disposition: A | Discharge status: Home / Self Care | Payer: PPO | Source: Ambulatory Visit | Attending: Anesthesiology | Admitting: Anesthesiology

## 2017-08-26 ENCOUNTER — Other Ambulatory Visit: Payer: Self-pay

## 2017-08-26 ENCOUNTER — Ambulatory Visit: Payer: Self-pay | Admitting: Adult Health

## 2017-08-26 ENCOUNTER — Ambulatory Visit: Payer: PPO

## 2017-08-26 ENCOUNTER — Encounter (HOSPITAL_COMMUNITY)
Admission: RE | Admit: 2017-08-26 | Discharge: 2017-08-26 | Disposition: A | Discharge status: Home / Self Care | Payer: PPO | Source: Ambulatory Visit | Attending: Orthopedic Surgery | Admitting: Orthopedic Surgery

## 2017-08-26 DIAGNOSIS — Z01818 Encounter for other preprocedural examination: Secondary | ICD-10-CM | POA: Diagnosis not present

## 2017-08-26 DIAGNOSIS — R05 Cough: Secondary | ICD-10-CM

## 2017-08-26 DIAGNOSIS — R059 Cough, unspecified: Secondary | ICD-10-CM

## 2017-08-26 DIAGNOSIS — Z01812 Encounter for preprocedural laboratory examination: Secondary | ICD-10-CM | POA: Diagnosis not present

## 2017-08-26 HISTORY — DX: Left bundle-branch block, unspecified: I44.7

## 2017-08-26 HISTORY — DX: Sleep apnea, unspecified: G47.30

## 2017-08-26 HISTORY — DX: Urinary tract infection, site not specified: N39.0

## 2017-08-26 HISTORY — DX: Chronic kidney disease, stage 3 unspecified: N18.30

## 2017-08-26 HISTORY — DX: Chronic kidney disease, stage 3 (moderate): N18.3

## 2017-08-26 LAB — CBC
HCT: 38.5 % (ref 36.0–46.0)
Hemoglobin: 12.4 g/dL (ref 12.0–15.0)
MCH: 30.2 pg (ref 26.0–34.0)
MCHC: 32.2 g/dL (ref 30.0–36.0)
MCV: 93.9 fL (ref 78.0–100.0)
PLATELETS: 361 10*3/uL (ref 150–400)
RBC: 4.1 MIL/uL (ref 3.87–5.11)
RDW: 14.4 % (ref 11.5–15.5)
WBC: 8.2 10*3/uL (ref 4.0–10.5)

## 2017-08-26 LAB — BASIC METABOLIC PANEL
Anion gap: 9 (ref 5–15)
BUN: 33 mg/dL — AB (ref 6–20)
CO2: 28 mmol/L (ref 22–32)
CREATININE: 1.04 mg/dL — AB (ref 0.44–1.00)
Calcium: 9.2 mg/dL (ref 8.9–10.3)
Chloride: 102 mmol/L (ref 101–111)
GFR calc Af Amer: >60 mL/min (ref >60)
GFR, EST NON AFRICAN AMERICAN: 55 mL/min — AB (ref >60)
Glucose, Bld: 106 mg/dL — ABNORMAL HIGH (ref 65–99)
Potassium: 4.6 mmol/L (ref 3.5–5.1)
SODIUM: 139 mmol/L (ref 135–145)

## 2017-08-26 LAB — URINE CULTURE
MICRO NUMBER: 90530889
SPECIMEN QUALITY: ADEQUATE

## 2017-08-26 LAB — SURGICAL PCR SCREEN
MRSA, PCR: NEGATIVE
STAPHYLOCOCCUS AUREUS: NEGATIVE

## 2017-08-26 LAB — HEMOGLOBIN A1C
Hgb A1c MFr Bld: 5.5 % (ref 4.8–5.6)
MEAN PLASMA GLUCOSE: 111.15 mg/dL

## 2017-08-26 NOTE — Progress Notes (Signed)
PCP - Brigid Re Cardiologist - Johnsie Cancel hasn't seen in 2 years or so  Chest x-ray - 08/26/17 - recent cough seen PCP follow up today at 230 EKG - 06/22/17 Stress Test - unsure thinks 2017 but no records ECHO - 2017 Cardiac Cath - denies   Doesn't check her blood sugar   Anesthesia review: yes for recent cough, xray completed, has a follow up on 5/2 at 230  Patient denies shortness of breath, fever, cough and chest pain at PAT appointment   Patient verbalized understanding of instructions that were given to them at the PAT appointment. Patient was also instructed that they will need to review over the PAT instructions again at home before surgery.

## 2017-08-27 ENCOUNTER — Encounter: Payer: Self-pay | Admitting: Adult Health

## 2017-08-27 ENCOUNTER — Ambulatory Visit
Admission: RE | Admit: 2017-08-27 | Discharge: 2017-08-27 | Disposition: A | Discharge status: Home / Self Care | Payer: PPO | Source: Ambulatory Visit | Attending: Orthopedic Surgery | Admitting: Orthopedic Surgery

## 2017-08-27 ENCOUNTER — Encounter (HOSPITAL_COMMUNITY): Payer: Self-pay

## 2017-08-27 DIAGNOSIS — S8262XA Displaced fracture of lateral malleolus of left fibula, initial encounter for closed fracture: Secondary | ICD-10-CM | POA: Diagnosis not present

## 2017-08-27 DIAGNOSIS — S8255XA Nondisplaced fracture of medial malleolus of left tibia, initial encounter for closed fracture: Secondary | ICD-10-CM

## 2017-08-27 DIAGNOSIS — S9302XA Subluxation of left ankle joint, initial encounter: Secondary | ICD-10-CM | POA: Diagnosis not present

## 2017-08-27 NOTE — Progress Notes (Signed)
Patient presents for reevaluation today; cough and urinary symptoms have resolved, she feels back to baseline. Lungs are clear throughout. CXR from presurgical clearance reviewed and unremarkable. Surgical clearance letter was completed and faxed to Dr. Sid Falcon office as requested, and an additional copy was provided to the patient.

## 2017-08-27 NOTE — Progress Notes (Addendum)
Anesthesia Chart Review:   Case:  [id number] Date/Time:  08/30/17 1130   Procedure:  OPEN REDUCTION INTERNAL FIXATION (ORIF) ANKLE FRACTURE (Left )   Anesthesia type:  Spinal   Pre-op diagnosis:  Left ankle fracture   Location:  [facility] OR ROOM 08 / [place] OR   Surgeon:  Rod Can, MD      DISCUSSION:  - Pt is a 67 year old female with hx LBBB, CKD (stage III)  - Pt c/o cough at pre-admission testing.  Was seen at PCP's office 08/24/17 and dx exacerbation of mild intermittent asthmatic bronchitis and UTI. Was reevaluated by PCP 08/27/17; pt had returned to normal baseline and "low risk" for surgery.      VS: BP 111/73   Pulse 63   Temp 36.8 C   Resp 18   Ht 5\' 4"  (1.626 m)   Wt 159 lb (72.1 kg)   SpO2 95%   BMI 27.29 kg/m    PROVIDERS: PCP is Unk [name], MD Nephrologist is Donato Heinz, MD Used to see cardiologist Jenkins Rouge, MD, evaluated for LBBB. Last office visit 06/20/15 for a pre-op eval.     LABS: Labs reviewed: Acceptable for surgery. (all labs ordered are listed, but only abnormal results are displayed)  Labs Reviewed  BASIC METABOLIC PANEL - Abnormal; Notable for the following components:      Result Value   Glucose, Bld 106 (*)    BUN 33 (*)    Creatinine, Ser 1.04 (*)    GFR calc non Af Amer 55 (*)    All other components within normal limits  SURGICAL PCR SCREEN  CBC  HEMOGLOBIN A1C     IMAGES:  CXR 08/26/17: No active cardiopulmonary disease.  Chronic changes are noted  Renal US 06/18/17: No evidence of significant renal artery stenosis.    EKG 06/22/17: Sinus bradycardia (55 bpm).  LBBB.   CV:  Echo 06/28/15:  - Left ventricle: The cavity size was normal. Wall thickness was at the upper limits of normal. Systolic function was normal. The estimated ejection fraction was in the range of 55% to 60%. Wall motion was normal; there were no regional wall motion abnormalities. Doppler parameters are consistent with abnormal left ventricular  relaxation (grade 1 diastolic dysfunction). - Aortic valve: There was trivial regurgitation. - Mitral valve: There was trivial regurgitation. - Left atrium: The atrium was mildly dilated. - Right atrium: Central venous pressure (est): 3 mm Hg. - Atrial septum: No defect or patent foramen ovale was identified. - Tricuspid valve: There was mild regurgitation. - Pulmonary arteries: PA peak pressure: 25 mm Hg (S). - Pericardium, extracardiac: There was no pericardial effusion. - Impressions: Upper normal LV wall thickness with LVEF 55-60%.  Grade 1 diastolic dysfunction with normal estimated LV filling pressure. Mild left atrial enlargement. Trivial mitral regurgitation. Trivial aortic regurgitation. Mild tricuspid regurgitation with PASP 25 mmHg.  Nuclear stress test 06/28/15:   The left ventricular ejection fraction is normal (55-65%).  Nuclear stress EF: 60%.  There was no ST segment deviation noted during stress.  The study is normal. No evidence of ischemia  This is a low risk study.   Past Medical History:  Diagnosis Date  . Chronic kidney disease (CKD), stage III (moderate) (HCC)   . Diverticulosis   . Emphysema lung ([place])   . Helicobacter pylori (H. pylori)   . History of IBS   . IBS (irritable bowel syndrome)   . LBBB (left bundle branch block)   . Lower  urinary tract infection 06/21/2017  . Migraines   . OCD (obsessive compulsive disorder)   . Prediabetes    pcp  . Renal disorder    AKI due to lisinpril, his. pyelonephritis,   . Sepsis ([place])   . Sleep apnea    no CPAP  . Vitamin D deficiency     Past Surgical History:  Procedure Laterality Date  . BREAST ENHANCEMENT SURGERY    . BUNIONECTOMY     Bil  . COLON SURGERY     colonoscopy  . HEMORRHOID SURGERY    . OOPHORECTOMY    . TONSILLECTOMY      MEDICATIONS: . acetaminophen (TYLENOL) 500 MG tablet  . ALPRAZolam (XANAX) 0.5 MG tablet  . Ascorbic Acid (VITAMIN C PO)  . aspirin EC 81 MG tablet  . atenolol  (TENORMIN) 100 MG tablet  . buPROPion (WELLBUTRIN XL) 150 MG 24 hr tablet  . Cholecalciferol (VITAMIN D-3) 5000 UNITS TABS  . dicyclomine (BENTYL) 10 MG capsule  . DICYCLOMINE HCL PO  . docusate sodium (COLACE) 100 MG capsule  . doxycycline (VIBRAMYCIN) 100 MG capsule  . HYDROcodone-acetaminophen (NORCO/VICODIN) 5-325 MG tablet  . hyoscyamine (LEVSIN SL) 0.125 MG SL tablet  . levothyroxine (SYNTHROID, LEVOTHROID) 125 MCG tablet  . Magnesium 250 MG TABS  . meclizine (ANTIVERT) 25 MG tablet  . methylphenidate (RITALIN) 20 MG tablet  . Multiple Vitamins-Minerals (MULTIVITAMIN WITH MINERALS) tablet  . ondansetron (ZOFRAN) 8 MG tablet  . pantoprazole (PROTONIX) 40 MG tablet  . predniSONE (DELTASONE) 20 MG tablet  . promethazine-dextromethorphan (PROMETHAZINE-DM) 6.25-15 MG/5ML syrup  . ranitidine (ZANTAC) 300 MG tablet  . sertraline (ZOLOFT) 100 MG tablet  . trimethoprim (TRIMPEX) 100 MG tablet   No current facility-administered medications for this encounter.     If no changes, I anticipate pt can proceed with surgery as scheduled.   Willeen Cass, FNP-BC [facility] Short Stay Surgical Center/Anesthesiology Phone: [phone] 08/27/2017 3:30 PM

## 2017-08-30 ENCOUNTER — Observation Stay (HOSPITAL_COMMUNITY)
Admission: RE | Admit: 2017-08-30 | Discharge: 2017-08-31 | Disposition: A | Discharge status: Home / Self Care | Payer: PPO | Source: Ambulatory Visit | Attending: Orthopedic Surgery | Admitting: Orthopedic Surgery

## 2017-08-30 ENCOUNTER — Inpatient Hospital Stay (HOSPITAL_COMMUNITY): Payer: PPO | Admitting: Anesthesiology

## 2017-08-30 ENCOUNTER — Inpatient Hospital Stay (HOSPITAL_COMMUNITY): Payer: PPO | Admitting: Emergency Medicine

## 2017-08-30 ENCOUNTER — Encounter (HOSPITAL_COMMUNITY): Payer: Self-pay | Admitting: *Deleted

## 2017-08-30 ENCOUNTER — Inpatient Hospital Stay (HOSPITAL_COMMUNITY): Payer: PPO

## 2017-08-30 ENCOUNTER — Encounter (HOSPITAL_COMMUNITY)
Admission: RE | Disposition: A | Discharge status: Home / Self Care | Payer: Self-pay | Source: Ambulatory Visit | Attending: Orthopedic Surgery

## 2017-08-30 ENCOUNTER — Observation Stay (HOSPITAL_COMMUNITY): Payer: PPO

## 2017-08-30 DIAGNOSIS — Z8619 Personal history of other infectious and parasitic diseases: Secondary | ICD-10-CM | POA: Insufficient documentation

## 2017-08-30 DIAGNOSIS — I1310 Hypertensive heart and chronic kidney disease without heart failure, with stage 1 through stage 4 chronic kidney disease, or unspecified chronic kidney disease: Secondary | ICD-10-CM | POA: Diagnosis not present

## 2017-08-30 DIAGNOSIS — Z87891 Personal history of nicotine dependence: Secondary | ICD-10-CM | POA: Diagnosis not present

## 2017-08-30 DIAGNOSIS — F429 Obsessive-compulsive disorder, unspecified: Secondary | ICD-10-CM | POA: Diagnosis not present

## 2017-08-30 DIAGNOSIS — R7303 Prediabetes: Secondary | ICD-10-CM | POA: Insufficient documentation

## 2017-08-30 DIAGNOSIS — Z888 Allergy status to other drugs, medicaments and biological substances status: Secondary | ICD-10-CM | POA: Diagnosis not present

## 2017-08-30 DIAGNOSIS — I447 Left bundle-branch block, unspecified: Secondary | ICD-10-CM | POA: Diagnosis not present

## 2017-08-30 DIAGNOSIS — S82852A Displaced trimalleolar fracture of left lower leg, initial encounter for closed fracture: Principal | ICD-10-CM | POA: Insufficient documentation

## 2017-08-30 DIAGNOSIS — J439 Emphysema, unspecified: Secondary | ICD-10-CM | POA: Insufficient documentation

## 2017-08-30 DIAGNOSIS — Z885 Allergy status to narcotic agent status: Secondary | ICD-10-CM | POA: Diagnosis not present

## 2017-08-30 DIAGNOSIS — K589 Irritable bowel syndrome without diarrhea: Secondary | ICD-10-CM | POA: Diagnosis not present

## 2017-08-30 DIAGNOSIS — Z79899 Other long term (current) drug therapy: Secondary | ICD-10-CM | POA: Insufficient documentation

## 2017-08-30 DIAGNOSIS — E559 Vitamin D deficiency, unspecified: Secondary | ICD-10-CM | POA: Diagnosis not present

## 2017-08-30 DIAGNOSIS — N183 Chronic kidney disease, stage 3 (moderate): Secondary | ICD-10-CM | POA: Diagnosis not present

## 2017-08-30 DIAGNOSIS — G4730 Sleep apnea, unspecified: Secondary | ICD-10-CM | POA: Diagnosis not present

## 2017-08-30 DIAGNOSIS — I10 Essential (primary) hypertension: Secondary | ICD-10-CM | POA: Diagnosis not present

## 2017-08-30 DIAGNOSIS — Z7982 Long term (current) use of aspirin: Secondary | ICD-10-CM | POA: Diagnosis not present

## 2017-08-30 DIAGNOSIS — Z419 Encounter for procedure for purposes other than remedying health state, unspecified: Secondary | ICD-10-CM

## 2017-08-30 DIAGNOSIS — Z91040 Latex allergy status: Secondary | ICD-10-CM | POA: Diagnosis not present

## 2017-08-30 DIAGNOSIS — S82851A Displaced trimalleolar fracture of right lower leg, initial encounter for closed fracture: Secondary | ICD-10-CM | POA: Diagnosis not present

## 2017-08-30 DIAGNOSIS — K5790 Diverticulosis of intestine, part unspecified, without perforation or abscess without bleeding: Secondary | ICD-10-CM | POA: Insufficient documentation

## 2017-08-30 DIAGNOSIS — Z882 Allergy status to sulfonamides status: Secondary | ICD-10-CM | POA: Insufficient documentation

## 2017-08-30 DIAGNOSIS — Z881 Allergy status to other antibiotic agents status: Secondary | ICD-10-CM | POA: Insufficient documentation

## 2017-08-30 DIAGNOSIS — G43909 Migraine, unspecified, not intractable, without status migrainosus: Secondary | ICD-10-CM | POA: Diagnosis not present

## 2017-08-30 DIAGNOSIS — W19XXXA Unspecified fall, initial encounter: Secondary | ICD-10-CM | POA: Insufficient documentation

## 2017-08-30 DIAGNOSIS — E039 Hypothyroidism, unspecified: Secondary | ICD-10-CM | POA: Diagnosis not present

## 2017-08-30 DIAGNOSIS — G8918 Other acute postprocedural pain: Secondary | ICD-10-CM | POA: Diagnosis not present

## 2017-08-30 DIAGNOSIS — Z09 Encounter for follow-up examination after completed treatment for conditions other than malignant neoplasm: Secondary | ICD-10-CM

## 2017-08-30 HISTORY — PX: ORIF ANKLE FRACTURE: SHX5408

## 2017-08-30 HISTORY — DX: Displaced trimalleolar fracture of left lower leg, initial encounter for closed fracture: S82.852A

## 2017-08-30 LAB — CBC
HEMATOCRIT: 39.8 % (ref 36.0–46.0)
HEMOGLOBIN: 12.8 g/dL (ref 12.0–15.0)
MCH: 30.5 pg (ref 26.0–34.0)
MCHC: 32.2 g/dL (ref 30.0–36.0)
MCV: 94.8 fL (ref 78.0–100.0)
Platelets: 385 10*3/uL (ref 150–400)
RBC: 4.2 MIL/uL (ref 3.87–5.11)
RDW: 14.8 % (ref 11.5–15.5)
WBC: 16.7 10*3/uL — AB (ref 4.0–10.5)

## 2017-08-30 LAB — CREATININE, SERUM
Creatinine, Ser: 1.28 mg/dL — ABNORMAL HIGH (ref 0.44–1.00)
GFR calc Af Amer: 49 mL/min — ABNORMAL LOW (ref >60)
GFR, EST NON AFRICAN AMERICAN: 43 mL/min — AB (ref >60)

## 2017-08-30 SURGERY — OPEN REDUCTION INTERNAL FIXATION (ORIF) ANKLE FRACTURE
Anesthesia: General | Site: Ankle | Laterality: Left

## 2017-08-30 MED ORDER — MIDAZOLAM HCL 2 MG/2ML IJ SOLN
INTRAMUSCULAR | Status: AC
  Filled 2017-08-30: qty 2

## 2017-08-30 MED ORDER — ONDANSETRON HCL 4 MG/2ML IJ SOLN
INTRAMUSCULAR | Status: DC | PRN
  Administered 2017-08-30: 4 mg via INTRAVENOUS

## 2017-08-30 MED ORDER — SODIUM CHLORIDE 0.9 % IV SOLN: INTRAVENOUS | Status: DC

## 2017-08-30 MED ORDER — FENTANYL CITRATE (PF) 250 MCG/5ML IJ SOLN
INTRAMUSCULAR | Status: DC | PRN
  Administered 2017-08-30 (×3): 50 ug via INTRAVENOUS

## 2017-08-30 MED ORDER — VITAMIN C 500 MG PO TABS: 500.0000 mg | ORAL_TABLET | Freq: Every day | ORAL | Status: DC

## 2017-08-30 MED ORDER — LEVOTHYROXINE SODIUM 75 MCG PO TABS
125.0000 ug | ORAL_TABLET | Freq: Every day | ORAL | Status: DC
  Administered 2017-08-31: 125 ug via ORAL
  Filled 2017-08-30: qty 1

## 2017-08-30 MED ORDER — DICYCLOMINE HCL 10 MG PO CAPS
10.0000 mg | ORAL_CAPSULE | Freq: Two times a day (BID) | ORAL | Status: DC
  Administered 2017-08-30: 10 mg via ORAL
  Filled 2017-08-30: qty 1

## 2017-08-30 MED ORDER — LIDOCAINE 2% (20 MG/ML) 5 ML SYRINGE
INTRAMUSCULAR | Status: DC | PRN
  Administered 2017-08-30: 60 mg via INTRAVENOUS

## 2017-08-30 MED ORDER — SERTRALINE HCL 50 MG PO TABS
100.0000 mg | ORAL_TABLET | Freq: Every day | ORAL | Status: DC
Start: 2017-08-31 — End: 2017-08-31

## 2017-08-30 MED ORDER — METOCLOPRAMIDE HCL 5 MG PO TABS
5.0000 mg | ORAL_TABLET | Freq: Three times a day (TID) | ORAL | Status: DC | PRN
  Filled 2017-08-30: qty 2

## 2017-08-30 MED ORDER — 0.9 % SODIUM CHLORIDE (POUR BTL) OPTIME
TOPICAL | Status: DC | PRN
  Administered 2017-08-30: 1000 mL

## 2017-08-30 MED ORDER — FENTANYL CITRATE (PF) 100 MCG/2ML IJ SOLN
INTRAMUSCULAR | Status: AC
  Administered 2017-08-30: 50 ug
  Filled 2017-08-30: qty 2

## 2017-08-30 MED ORDER — PROMETHAZINE HCL 25 MG/ML IJ SOLN: 6.2500 mg | INTRAMUSCULAR | Status: DC | PRN

## 2017-08-30 MED ORDER — MIDAZOLAM HCL 2 MG/2ML IJ SOLN
INTRAMUSCULAR | Status: AC
  Administered 2017-08-30: 2 mg
  Filled 2017-08-30: qty 2

## 2017-08-30 MED ORDER — METHOCARBAMOL 1000 MG/10ML IJ SOLN
500.0000 mg | Freq: Four times a day (QID) | INTRAVENOUS | Status: DC | PRN
  Filled 2017-08-30: qty 5

## 2017-08-30 MED ORDER — KETOROLAC TROMETHAMINE 15 MG/ML IJ SOLN
7.5000 mg | Freq: Four times a day (QID) | INTRAMUSCULAR | Status: DC
  Administered 2017-08-30 – 2017-08-31 (×2): 7.5 mg via INTRAVENOUS
  Filled 2017-08-30 (×2): qty 1

## 2017-08-30 MED ORDER — PROPOFOL 10 MG/ML IV BOLUS
INTRAVENOUS | Status: AC
  Filled 2017-08-30: qty 20

## 2017-08-30 MED ORDER — FENTANYL CITRATE (PF) 250 MCG/5ML IJ SOLN
INTRAMUSCULAR | Status: AC
  Filled 2017-08-30: qty 5

## 2017-08-30 MED ORDER — ARTIFICIAL TEARS OPHTHALMIC OINT
TOPICAL_OINTMENT | OPHTHALMIC | Status: AC
  Filled 2017-08-30: qty 3.5

## 2017-08-30 MED ORDER — ENOXAPARIN SODIUM 40 MG/0.4ML ~~LOC~~ SOLN
40.0000 mg | SUBCUTANEOUS | Status: DC
  Administered 2017-08-31: 40 mg via SUBCUTANEOUS
  Filled 2017-08-30: qty 0.4

## 2017-08-30 MED ORDER — SUGAMMADEX SODIUM 200 MG/2ML IV SOLN
INTRAVENOUS | Status: DC | PRN
  Administered 2017-08-30: 150 mg via INTRAVENOUS

## 2017-08-30 MED ORDER — CLINDAMYCIN PHOSPHATE 900 MG/50ML IV SOLN
INTRAVENOUS | Status: AC
  Filled 2017-08-30: qty 50

## 2017-08-30 MED ORDER — METHOCARBAMOL 500 MG PO TABS
ORAL_TABLET | ORAL | Status: AC
  Administered 2017-08-30: 500 mg via ORAL
  Filled 2017-08-30: qty 1

## 2017-08-30 MED ORDER — METHOCARBAMOL 500 MG PO TABS
500.0000 mg | ORAL_TABLET | Freq: Four times a day (QID) | ORAL | Status: DC | PRN
  Administered 2017-08-30: 500 mg via ORAL
  Filled 2017-08-30: qty 1

## 2017-08-30 MED ORDER — BUPROPION HCL ER (XL) 150 MG PO TB24
150.0000 mg | ORAL_TABLET | Freq: Every day | ORAL | Status: DC
  Filled 2017-08-30: qty 1

## 2017-08-30 MED ORDER — ROCURONIUM BROMIDE 10 MG/ML (PF) SYRINGE
PREFILLED_SYRINGE | INTRAVENOUS | Status: DC | PRN
  Administered 2017-08-30: 30 mg via INTRAVENOUS
  Administered 2017-08-30: 20 mg via INTRAVENOUS
  Administered 2017-08-30: 50 mg via INTRAVENOUS
  Administered 2017-08-30: 20 mg via INTRAVENOUS

## 2017-08-30 MED ORDER — DIPHENHYDRAMINE HCL 12.5 MG/5ML PO ELIX: 12.5000 mg | ORAL_SOLUTION | ORAL | Status: DC | PRN

## 2017-08-30 MED ORDER — HYDROCODONE-ACETAMINOPHEN 7.5-325 MG PO TABS: 1.0000 | ORAL_TABLET | ORAL | Status: DC | PRN

## 2017-08-30 MED ORDER — CLINDAMYCIN PHOSPHATE 900 MG/50ML IV SOLN
900.0000 mg | INTRAVENOUS | Status: AC
  Administered 2017-08-30: 900 mg via INTRAVENOUS

## 2017-08-30 MED ORDER — HYDROMORPHONE HCL 2 MG/ML IJ SOLN: 0.3000 mg | INTRAMUSCULAR | Status: DC | PRN

## 2017-08-30 MED ORDER — ACETAMINOPHEN 325 MG PO TABS: 325.0000 mg | ORAL_TABLET | Freq: Four times a day (QID) | ORAL | Status: DC | PRN

## 2017-08-30 MED ORDER — ATENOLOL 100 MG PO TABS
100.0000 mg | ORAL_TABLET | Freq: Every day | ORAL | Status: DC
  Filled 2017-08-30: qty 1

## 2017-08-30 MED ORDER — METOCLOPRAMIDE HCL 5 MG/ML IJ SOLN
5.0000 mg | Freq: Three times a day (TID) | INTRAMUSCULAR | Status: DC | PRN
  Filled 2017-08-30: qty 2

## 2017-08-30 MED ORDER — ONDANSETRON HCL 4 MG PO TABS
4.0000 mg | ORAL_TABLET | Freq: Four times a day (QID) | ORAL | Status: DC | PRN
  Filled 2017-08-30: qty 1

## 2017-08-30 MED ORDER — CLINDAMYCIN PHOSPHATE 600 MG/50ML IV SOLN
600.0000 mg | Freq: Four times a day (QID) | INTRAVENOUS | Status: DC
  Administered 2017-08-30 – 2017-08-31 (×2): 600 mg via INTRAVENOUS
  Filled 2017-08-30 (×2): qty 50

## 2017-08-30 MED ORDER — PHENYLEPHRINE 40 MCG/ML (10ML) SYRINGE FOR IV PUSH (FOR BLOOD PRESSURE SUPPORT)
PREFILLED_SYRINGE | INTRAVENOUS | Status: DC | PRN
  Administered 2017-08-30 (×2): 80 ug via INTRAVENOUS
  Administered 2017-08-30: 40 ug via INTRAVENOUS

## 2017-08-30 MED ORDER — CHLORHEXIDINE GLUCONATE 4 % EX LIQD: 60.0000 mL | Freq: Once | CUTANEOUS | Status: DC

## 2017-08-30 MED ORDER — ROPIVACAINE HCL 5 MG/ML IJ SOLN
INTRAMUSCULAR | Status: DC | PRN
  Administered 2017-08-30: 25 mL via PERINEURAL

## 2017-08-30 MED ORDER — PROPOFOL 10 MG/ML IV BOLUS
INTRAVENOUS | Status: DC | PRN
  Administered 2017-08-30: 100 mg via INTRAVENOUS

## 2017-08-30 MED ORDER — ALPRAZOLAM 0.5 MG PO TABS: 0.5000 mg | ORAL_TABLET | Freq: Three times a day (TID) | ORAL | Status: DC | PRN

## 2017-08-30 MED ORDER — PHENYLEPHRINE HCL 10 MG/ML IJ SOLN
INTRAMUSCULAR | Status: DC | PRN
  Administered 2017-08-30: 25 ug/min via INTRAVENOUS

## 2017-08-30 MED ORDER — POVIDONE-IODINE 10 % EX SWAB: 2.0000 "application " | Freq: Once | CUTANEOUS | Status: DC

## 2017-08-30 MED ORDER — KETOROLAC TROMETHAMINE 30 MG/ML IJ SOLN
INTRAMUSCULAR | Status: AC
  Administered 2017-08-30: 30 mg via INTRAVENOUS
  Filled 2017-08-30: qty 1

## 2017-08-30 MED ORDER — KETOROLAC TROMETHAMINE 30 MG/ML IJ SOLN
30.0000 mg | Freq: Once | INTRAMUSCULAR | Status: AC | PRN
  Administered 2017-08-30: 30 mg via INTRAVENOUS

## 2017-08-30 MED ORDER — HYDROCODONE-ACETAMINOPHEN 5-325 MG PO TABS
ORAL_TABLET | ORAL | Status: AC
  Administered 2017-08-30: 1
  Filled 2017-08-30: qty 1

## 2017-08-30 MED ORDER — HYDROCODONE-ACETAMINOPHEN 5-325 MG PO TABS
1.0000 | ORAL_TABLET | ORAL | Status: DC | PRN
  Administered 2017-08-30: 2 via ORAL
  Filled 2017-08-30: qty 2

## 2017-08-30 MED ORDER — DEXAMETHASONE SODIUM PHOSPHATE 10 MG/ML IJ SOLN
INTRAMUSCULAR | Status: DC | PRN
  Administered 2017-08-30: 10 mg via INTRAVENOUS

## 2017-08-30 MED ORDER — HYOSCYAMINE SULFATE 0.125 MG SL SUBL
0.1250 mg | SUBLINGUAL_TABLET | SUBLINGUAL | Status: DC | PRN
  Filled 2017-08-30: qty 1

## 2017-08-30 MED ORDER — LACTATED RINGERS IV SOLN
INTRAVENOUS | Status: DC
  Administered 2017-08-30 (×2): via INTRAVENOUS

## 2017-08-30 MED ORDER — DOXYCYCLINE HYCLATE 100 MG PO TABS
100.0000 mg | ORAL_TABLET | Freq: Two times a day (BID) | ORAL | Status: DC
  Administered 2017-08-30: 100 mg via ORAL
  Filled 2017-08-30 (×3): qty 1

## 2017-08-30 MED ORDER — LIDOCAINE-EPINEPHRINE (PF) 1.5 %-1:200000 IJ SOLN
INTRAMUSCULAR | Status: DC | PRN
  Administered 2017-08-30: 10 mL via PERINEURAL

## 2017-08-30 MED ORDER — DOCUSATE SODIUM 100 MG PO CAPS
100.0000 mg | ORAL_CAPSULE | Freq: Two times a day (BID) | ORAL | Status: DC
  Administered 2017-08-30: 100 mg via ORAL
  Filled 2017-08-30: qty 1

## 2017-08-30 MED ORDER — MECLIZINE HCL 25 MG PO TABS
25.0000 mg | ORAL_TABLET | Freq: Two times a day (BID) | ORAL | Status: DC | PRN
  Filled 2017-08-30: qty 1

## 2017-08-30 MED ORDER — ONDANSETRON HCL 4 MG/2ML IJ SOLN
4.0000 mg | Freq: Four times a day (QID) | INTRAMUSCULAR | Status: DC | PRN
  Filled 2017-08-30: qty 2

## 2017-08-30 MED ORDER — EPHEDRINE SULFATE 50 MG/ML IJ SOLN
INTRAMUSCULAR | Status: DC | PRN
  Administered 2017-08-30: 5 mg via INTRAVENOUS
  Administered 2017-08-30 (×2): 10 mg via INTRAVENOUS

## 2017-08-30 MED ORDER — ARTIFICIAL TEARS OPHTHALMIC OINT
TOPICAL_OINTMENT | OPHTHALMIC | Status: DC | PRN
  Administered 2017-08-30: 1 via OPHTHALMIC

## 2017-08-30 MED ORDER — MORPHINE SULFATE (PF) 2 MG/ML IV SOLN: 0.5000 mg | INTRAVENOUS | Status: DC | PRN

## 2017-08-30 SURGICAL SUPPLY — 57 items
BANDAGE ACE 4X5 VEL STRL LF (GAUZE/BANDAGES/DRESSINGS) ×2 IMPLANT
BANDAGE ACE 6X5 VEL STRL LF (GAUZE/BANDAGES/DRESSINGS) ×2 IMPLANT
BIT DRILL 2.5X2.75 QC CALB (BIT) ×1 IMPLANT
BIT DRILL 2.9 CANN QC NONSTRL (BIT) ×1 IMPLANT
BNDG CMPR MED 10X6 ELC LF (GAUZE/BANDAGES/DRESSINGS)
BNDG COHESIVE 4X5 TAN STRL (GAUZE/BANDAGES/DRESSINGS) ×2 IMPLANT
BNDG ELASTIC 6X10 VLCR STRL LF (GAUZE/BANDAGES/DRESSINGS) ×1 IMPLANT
CHLORAPREP W/TINT 26ML (MISCELLANEOUS) ×2 IMPLANT
CUFF TOURN SGL QUICK 34 (TOURNIQUET CUFF) ×2
CUFF TRNQT CYL 34X4X40X1 (TOURNIQUET CUFF) ×1 IMPLANT
DRAPE C-ARM 42X120 X-RAY (DRAPES) ×2 IMPLANT
DRAPE C-ARM 42X72 X-RAY (DRAPES) ×2 IMPLANT
DRAPE C-ARMOR (DRAPES) ×2 IMPLANT
DRAPE U-SHAPE 47X51 STRL (DRAPES) ×2 IMPLANT
DRSG ADAPTIC 3X8 NADH LF (GAUZE/BANDAGES/DRESSINGS) ×2 IMPLANT
DRSG PAD ABDOMINAL 8X10 ST (GAUZE/BANDAGES/DRESSINGS) ×2 IMPLANT
ELECT REM PT RETURN 9FT ADLT (ELECTROSURGICAL) ×2
ELECTRODE REM PT RTRN 9FT ADLT (ELECTROSURGICAL) ×1 IMPLANT
FACESHIELD WRAPAROUND (MASK) ×2 IMPLANT
FACESHIELD WRAPAROUND OR TEAM (MASK) ×1 IMPLANT
GAUZE SPONGE 4X4 12PLY STRL (GAUZE/BANDAGES/DRESSINGS) ×3 IMPLANT
GLOVE BIO SURGEON STRL SZ8.5 (GLOVE) IMPLANT
GLOVE BIOGEL PI IND STRL 8.5 (GLOVE) ×1 IMPLANT
GLOVE BIOGEL PI INDICATOR 8.5 (GLOVE) ×1
GOWN SPEC L3 XXLG W/TWL (GOWN DISPOSABLE) ×4 IMPLANT
GOWN STRL REUS W/TWL LRG LVL3 (GOWN DISPOSABLE) ×2 IMPLANT
K-WIRE ACE 1.6X6 (WIRE) ×10
KWIRE ACE 1.6X6 (WIRE) IMPLANT
MANIFOLD NEPTUNE II (INSTRUMENTS) ×1 IMPLANT
NS IRRIG 1000ML POUR BTL (IV SOLUTION) ×2 IMPLANT
PACK ORTHO EXTREMITY (CUSTOM PROCEDURE TRAY) ×2 IMPLANT
PAD CAST 4YDX4 CTTN HI CHSV (CAST SUPPLIES) ×2 IMPLANT
PADDING CAST ABS 6INX4YD NS (CAST SUPPLIES)
PADDING CAST ABS COTTON 6X4 NS (CAST SUPPLIES) ×2 IMPLANT
PADDING CAST COTTON 4X4 STRL (CAST SUPPLIES) ×2
PLATE ACE 3.5MM 4HOLE (Plate) ×1 IMPLANT
PLATE FIBULAR COMP LOCK 10H (Plate) ×1 IMPLANT
SCREW ACE CAN 4.0 40M (Screw) ×1 IMPLANT
SCREW ACE CAN 4.0 44M (Screw) ×1 IMPLANT
SCREW CORTICAL 3.5MM 26MM (Screw) ×1 IMPLANT
SCREW CORTICAL 3.5MM 36MM (Screw) ×1 IMPLANT
SCREW CORTICAL LOW PROF 3.5X20 (Screw) ×1 IMPLANT
SCREW LOCK 3.5X10 DIST TIB (Screw) ×2 IMPLANT
SCREW LOCK 3.5X14 DIST TIB (Screw) ×2 IMPLANT
SCREW LOCK 3.5X22 DIST TIB (Screw) ×1 IMPLANT
SCREW LOW PROFILE 18MMX3.5MM (Screw) ×2 IMPLANT
SCREW LOW PROFILE 22MMX3.5MM (Screw) ×1 IMPLANT
SCREW NON LOCKING LP 3.5 16MM (Screw) ×2 IMPLANT
SPLINT PLASTER CAST XFAST 5X30 (CAST SUPPLIES) IMPLANT
SPLINT PLASTER XFAST SET 5X30 (CAST SUPPLIES) ×1
STRIP CLOSURE SKIN 1/2X4 (GAUZE/BANDAGES/DRESSINGS) ×2 IMPLANT
SUT ETHILON 3 0 PS 1 (SUTURE) ×5 IMPLANT
SUT VIC AB 1 CT1 27 (SUTURE) ×2
SUT VIC AB 1 CT1 27XBRD ANTBC (SUTURE) ×2 IMPLANT
SUT VIC AB 3-0 PS2 18 (SUTURE) ×6
SUT VIC AB 3-0 PS2 18XBRD (SUTURE) ×3 IMPLANT
TOWEL OR 17X26 10 PK STRL BLUE (TOWEL DISPOSABLE) ×3 IMPLANT

## 2017-08-30 NOTE — Anesthesia Postprocedure Evaluation (Signed)
Anesthesia Post Note  Patient: [name]  Procedure(s) Performed: OPEN REDUCTION INTERNAL FIXATION (ORIF) ANKLE FRACTURE (Left Ankle)     Patient location during evaluation: PACU Anesthesia Type: General Level of consciousness: awake and alert, oriented and patient cooperative Pain management: pain level controlled (some medial malleolus pain, improving with meds) Vital Signs Assessment: post-procedure vital signs reviewed and stable Respiratory status: spontaneous breathing, nonlabored ventilation and respiratory function stable Cardiovascular status: blood pressure returned to baseline and stable Postop Assessment: no apparent nausea or vomiting Anesthetic complications: no    Last Vitals:  Vitals:   08/30/17 1745 08/30/17 1800  BP: 124/76 135/64  Pulse: 64 69  Resp: 15 (!) 28  Temp:    SpO2: 97% 97%    Last Pain:  Vitals:   08/30/17 1300  TempSrc:   PainSc: 4                  Laraya Pestka,E. Ilhan Madan

## 2017-08-30 NOTE — Discharge Instructions (Signed)
Nonweightbearing left lower extremity. Elevate toes above nose on blanket/pillows. Keep splint clean and dry.  Do not remove.

## 2017-08-30 NOTE — Anesthesia Preprocedure Evaluation (Signed)
Anesthesia Evaluation  Patient identified by MRN, date of birth, ID band Patient awake    Reviewed: Allergy & Precautions, NPO status , Patient's Chart, lab work & pertinent test results  Airway Mallampati: II  TM Distance: >3 FB Neck ROM: Full    Dental no notable dental hx.    Pulmonary sleep apnea , former smoker,    Pulmonary exam normal breath sounds clear to auscultation       Cardiovascular hypertension, Normal cardiovascular exam Rhythm:Regular Rate:Normal     Neuro/Psych negative neurological ROS  negative psych ROS   GI/Hepatic negative GI ROS, Neg liver ROS,   Endo/Other  negative endocrine ROS  Renal/GU Renal InsufficiencyRenal disease  negative genitourinary   Musculoskeletal negative musculoskeletal ROS (+)   Abdominal   Peds negative pediatric ROS (+)  Hematology negative hematology ROS (+)   Anesthesia Other Findings   Reproductive/Obstetrics negative OB ROS                             Anesthesia Physical Anesthesia Plan  ASA: II  Anesthesia Plan: General   Post-op Pain Management:  Regional for Post-op pain   Induction: Intravenous  PONV Risk Score and Plan: 3 and Ondansetron, Dexamethasone and Treatment may vary due to age or medical condition  Airway Management Planned: Oral ETT  Additional Equipment:   Intra-op Plan:   Post-operative Plan: Extubation in OR  Informed Consent: I have reviewed the patients History and Physical, chart, labs and discussed the procedure including the risks, benefits and alternatives for the proposed anesthesia with the patient or authorized representative who has indicated his/her understanding and acceptance.   Dental advisory given  Plan Discussed with: CRNA and Surgeon  Anesthesia Plan Comments:         Anesthesia Quick Evaluation

## 2017-08-30 NOTE — Anesthesia Procedure Notes (Signed)
Anesthesia Procedure Image    

## 2017-08-30 NOTE — Anesthesia Procedure Notes (Signed)
Anesthesia Regional Block: Popliteal block   Pre-Anesthetic Checklist: ,, timeout performed, Correct Patient, Correct Site, Correct Laterality, Correct Procedure, Correct Position, site marked, Risks and benefits discussed,  Surgical consent,  Pre-op evaluation,  At surgeon's request and post-op pain management  Laterality: Left  Prep: chloraprep       Needles:  Injection technique: Single-shot  Needle Type: Echogenic Needle     Needle Length: 9cm      Additional Needles:   Procedures:,,,, ultrasound used (permanent image in chart),,,,  Narrative:  Start time: 08/30/2017 1:49 PM End time: 08/30/2017 1:57 PM Injection made incrementally with aspirations every 5 mL.  Performed by: Personally  Anesthesiologist: Myrtie Soman, MD  Additional Notes: Patient tolerated the procedure well without complications

## 2017-08-30 NOTE — Transfer of Care (Signed)
Immediate Anesthesia Transfer of Care Note  Patient: [name]  Procedure(s) Performed: OPEN REDUCTION INTERNAL FIXATION (ORIF) ANKLE FRACTURE (Left Ankle)  Patient Location: PACU  Anesthesia Type:General  Level of Consciousness: awake and alert   Airway & Oxygen Therapy: Patient Spontanous Breathing and Patient connected to face mask oxygen  Post-op Assessment: Report given to RN and Post -op Vital signs reviewed and stable  Post vital signs: Reviewed and stable  Last Vitals:  Vitals Value Taken Time  BP 129/104 08/30/2017  5:30 PM  Temp    Pulse 71 08/30/2017  5:31 PM  Resp 15 08/30/2017  5:31 PM  SpO2 93 % 08/30/2017  5:31 PM  Vitals shown include unvalidated device data.  Last Pain:  Vitals:   08/30/17 1300  TempSrc:   PainSc: 4       Patients Stated Pain Goal: 3 (38/88/28 0034)  Complications: No apparent anesthesia complications

## 2017-08-30 NOTE — Anesthesia Procedure Notes (Addendum)
Procedure Name: Intubation Date/Time: 08/30/2017 2:31 PM Performed by: Bryson Corona, CRNA Pre-anesthesia Checklist: Patient identified, Emergency Drugs available, Suction available and Patient being monitored Patient Re-evaluated:Patient Re-evaluated prior to induction Oxygen Delivery Method: Circle System Utilized Preoxygenation: Pre-oxygenation with 100% oxygen Induction Type: IV induction Ventilation: Mask ventilation without difficulty Laryngoscope Size: Mac and 3 Grade View: Grade I Tube type: Oral Tube size: 7.0 mm Number of attempts: 1 Airway Equipment and Method: Stylet Placement Confirmation: ETT inserted through vocal cords under direct vision,  positive ETCO2 and breath sounds checked- equal and bilateral Secured at: 22 cm Tube secured with: Tape Dental Injury: Teeth and Oropharynx as per pre-operative assessment

## 2017-08-30 NOTE — Op Note (Signed)
OPERATIVE REPORT   08/30/2017  4:47 PM  PATIENT:  [name]   SURGEON:  Bertram Savin, MD  ASSISTANT:  April Green, RNFA.   PREOPERATIVE DIAGNOSIS: Left trimalleolar ankle fracture/dislocation.  POSTOPERATIVE DIAGNOSIS:  Same.  PROCEDURE: Open reduction internal fixation left trimalleolar ankle fracture including medial, lateral, posterior malleoli.  ANESTHESIA:   GETA.  ANTIBIOTICS: 900 mg clindamycin.  IMPLANTS:  Biomet Alps 10 hole locking composite plate with 3.5 millimeter screws. Biomet 4-hole one third tubular plate with 3.5 millimeter screws x2. Biomet 4 mm cannulated screws x2.  SPECIMENS: None.  COMPLICATIONS: None.  DISPOSITION: Stable to PACU.  SURGICAL INDICATIONS:  [name] is a 67 y.o. female with a diagnosis of left trimalleolar ankle fracture/dislocation.  She was indicated for surgery.  The risks, benefits, and alternatives were discussed with the patient preoperatively including but not limited to the risks of infection, bleeding, nerve / blood vessel injury, malunion, nonunion, cardiopulmonary complications, the need for repeat surgery, among others, and the patient was willing to proceed.  PROCEDURE IN DETAIL: The patient was identified in the holding area using 2 identifiers.  The surgical site was marked by myself.  Regional block was placed by anesthesia.  Patient was taken the operating room, and general anesthesia was induced on the stretcher.    The patient was then positioned prone on the operating room table.  Gel rolls were placed under her.  Tourniquet was applied to the left thigh.  The left lower extremity was prepped and draped in normal sterile surgical fashion.  Timeout was called, verifying site and site of surgery. Antibiotics were given within 60 minutes of beginning the procedure.  Gravity was used to exsanguinate the lower extremity, and the tourniquet was elevated.  I made a longitudinal incision in between the  posterior border of the fibula and the lateral border of the Achilles tendon.  Careful blunt dissection was performed in order to protect the sural nerve.    I incised the fascia over the peroneals.  The peroneals were retracted medially, thus exposing the posterior fibula.   I identified the fracture.  She had a comminuted lateral malleolus fracture that was shortened and displaced.  Periosteal elevator was used to expose the fibula. Scar tissue was excisionally debrided with a curette and rongeur.  Fracture was reduced with traction and a provisional K wire was placed.  I selected a 10 hole plate which I pinned in place.  The plate was secured proximally and distally with 3.5 mm nonlocking screws.  A third screw was added proximally.  Distally, locking screw was placed.  The 2 compression screws distally were exchanged for locking screws.    I then retracted the peroneals laterally.  Blunt dissection was used to sweep the FHL off the posterior border of the tibia.  I placed a Hohmann retractor.  I identified the posterior malleolus fracture.  I irrigated the fracture site.  The fracture was reduced and pinned in the place.  I selected a 4 hole third tubular plate and I contoured the distal aspect of the plate.  The plate was held with two 3.5 mm bicortical screws proximally.   I then flexed the knee to 90 degrees.  I made a longitudinal incision over the medial malleolus.  Full-thickness skin flaps were created.  I identified the medial malleolus fracture site which I cleaned with a rongeur.  The fracture was reduced and I placed 2 K wires.  The wires were sequentially measured, drilled,  and then cannulated screws were placed.  I then obtained AP, lateral, mortise, and external rotation stress radiographs.  Fracture reduction was near-anatomic with restoration of fibular length.  Mortise intact.  No lateral talar shifting during stressing.  The tourniquet was let down.  Copious normal saline irrigation was  performed.  Meticulous hemostasis was achieved with Bovie electrocautery. Deep dermal closure was performed with 3-0 interrupted Vicryl sutures.  Skin was closed with 3-0 nylon using Algower-Donati technique.  A well-padded, well molded short leg posterior splint with a U was applied.  Sponge, needle, and instrument counts were correct at the end of the case x2.  There were no known complications.  The patient was extubated, and taken to the PACU in stable condition.  POSTOPERATIVE PLAN: Postoperatively, the patient will be admitted for 23-hour observation.  Nonweightbearing left lower extremity.  Lovenox in-house, home on aspirin for DVT prophylaxis.  Discharge home after clearing physical therapy.

## 2017-08-30 NOTE — H&P (Signed)
PREOPERATIVE H&P  Chief Complaint: Left ankle fracture  HPI: [name] is a 67 y.o. female who presents for preoperative history and physical with a diagnosis of Left trimalleolar ankle fracture / dislocation. She has elected for surgical management.   Past Medical History:  Diagnosis Date  . Chronic kidney disease (CKD), stage III (moderate) (HCC)   . Diverticulosis   . Emphysema lung ([place])   . Helicobacter pylori (H. pylori)   . History of IBS   . IBS (irritable bowel syndrome)   . LBBB (left bundle branch block)   . Lower urinary tract infection 06/21/2017  . Migraines   . OCD (obsessive compulsive disorder)   . Prediabetes    pcp  . Renal disorder    AKI due to lisinpril, his. pyelonephritis,   . Sepsis ([place])   . Sleep apnea    no CPAP  . Vitamin D deficiency    Past Surgical History:  Procedure Laterality Date  . BREAST ENHANCEMENT SURGERY    . BUNIONECTOMY     Bil  . COLON SURGERY     colonoscopy  . HEMORRHOID SURGERY    . OOPHORECTOMY    . TONSILLECTOMY     Social History   Socioeconomic History  . Marital status: Legally Separated    Spouse name: Not on file  . Number of children: Not on file  . Years of education: Not on file  . Highest education level: Not on file  Occupational History  . Not on file  Social Needs  . Financial resource strain: Not on file  . Food insecurity:    Worry: Not on file    Inability: Not on file  . Transportation needs:    Medical: Not on file    Non-medical: Not on file  Tobacco Use  . Smoking status: Former Smoker    Packs/day: 1.00    Years: 40.00    Pack years: 40.00    Types: Cigarettes    Last attempt to quit: 04/27/2002    Years since quitting: 15.3  . Smokeless tobacco: Never Used  Substance and Sexual Activity  . Alcohol use: No  . Drug use: No  . Sexual activity: Not on file  Lifestyle  . Physical activity:    Days per week: Not on file    Minutes per session: Not on file  . Stress: Not on file   Relationships  . Social connections:    Talks on phone: Not on file    Gets together: Not on file    Attends religious service: Not on file    Active member of club or organization: Not on file    Attends meetings of clubs or organizations: Not on file    Relationship status: Not on file  Other Topics Concern  . Not on file  Social History Narrative  . Not on file   Family History  Problem Relation Age of Onset  . Colon polyps Mother   . Colon cancer Neg Hx    Allergies  Allergen Reactions  . Ace Inhibitors Other (See Comments)    Acute renal failure (lisinopril)  . Cephalexin Anaphylaxis  . Macrobid [Nitrofurantoin] Other (See Comments)    Causes hyperthermia   . Codeine Nausea Only  . Latex Rash  . Sulfa Antibiotics Other (See Comments)    Do not take per nephrology   Prior to Admission medications   Medication Sig Start Date End Date Taking? Authorizing Provider  acetaminophen (TYLENOL) 500 MG tablet  Take 500 mg by mouth every 6 (six) hours as needed for pain.   Yes [provider]  ALPRAZolam Duanne Moron) 0.5 MG tablet Take 1/2 to 1 tablet 3 x day if needed for anxiety or sleep 04/15/14  Yes Unk [name], MD  Ascorbic Acid (VITAMIN C PO) Take 500 mg by mouth daily.    Yes [provider]  aspirin EC 81 MG tablet Take 81 mg by mouth daily.   Yes [provider]  atenolol (TENORMIN) 100 MG tablet TAKE 1 TABLET BY MOUTH DAILY 02/26/17  Yes Unk [name], MD  buPROPion (WELLBUTRIN XL) 150 MG 24 hr tablet TAKE 3 TABLETS BY MOUTH EVERY MORNING FOR MOOD/DEPRESSION 05/18/17  Yes Liane Comber, NP  Cholecalciferol (VITAMIN D-3) 5000 UNITS TABS Take 5,000 Units by mouth daily.    Yes [provider]  DICYCLOMINE HCL PO Take 10 mg by mouth 2 (two) times daily.    Yes [provider]  docusate sodium (COLACE) 100 MG capsule Take 100 mg by mouth 2 (two) times daily as needed for mild constipation.   Yes [provider]   doxycycline (VIBRAMYCIN) 100 MG capsule Take 1 capsule 2 x/day with food for 10 days 08/24/17  Yes Liane Comber, NP  HYDROcodone-acetaminophen (NORCO/VICODIN) 5-325 MG tablet Take 1-2 tablets by mouth every 4 (four) hours as needed for moderate pain. 08/19/17  Yes Davonna Belling, MD  hyoscyamine (LEVSIN SL) 0.125 MG SL tablet PLACE 1 TABLET (0.125 MG TOTAL) UNDER THE TONGUE EVERY 4 (FOUR) HOURS AS NEEDED. 04/26/17  Yes Unk [name], MD  levothyroxine (SYNTHROID, LEVOTHROID) 125 MCG tablet TAKE 1 TABLET BY MOUTH ONCE DAILY 05/18/17  Yes Liane Comber, NP  Magnesium 250 MG TABS Take 250 mg by mouth daily.   Yes [provider]  Multiple Vitamins-Minerals (MULTIVITAMIN WITH MINERALS) tablet Take 1 tablet by mouth daily.   Yes [provider]  ondansetron (ZOFRAN) 8 MG tablet TAKE 1 TABLET 3 x  DAILY AS NEEDED FOR NAUSEA 12/22/16  Yes Unk [name], MD  predniSONE (DELTASONE) 20 MG tablet 2 tablets daily for 3 days, 1 tablet daily for 4 days. 08/24/17  Yes Liane Comber, NP  promethazine-dextromethorphan (PROMETHAZINE-DM) 6.25-15 MG/5ML syrup Take 5 mLs by mouth 4 (four) times daily as needed for cough. 08/24/17  Yes Liane Comber, NP  ranitidine (ZANTAC) 300 MG tablet Take 1 to 2 tablets daily for heartburn & reflux to allow wean and transition from PPI / pantoprazole 09/01/16  Yes Unk [name], MD  sertraline (ZOLOFT) 100 MG tablet TAKE 1 TABLET BY MOUTH ONCE DAILY FOR MOOD AND DEPRESSION 04/26/17  Yes Unk [name], MD  meclizine (ANTIVERT) 25 MG tablet TAKE 1 TABLET BY MOUTH 3 TIMES DAILY AS NEEDED FOR NAUSEA 03/08/17   Unk [name], MD  methylphenidate (RITALIN) 20 MG tablet 1/2 to 1 tab 1 to 2 x / daily as needed for ADD or alertness 03/22/17 04/22/17  Unk [name], MD  pantoprazole (PROTONIX) 40 MG tablet Take 1 tablet (40 mg total) by mouth daily. Patient not taking: Reported on 08/24/2017 12/22/16 12/22/17  Unk [name], MD     Positive ROS: All  other systems have been reviewed and were otherwise negative with the exception of those mentioned in the HPI and as above.  Physical Exam: General: Alert, no acute distress Cardiovascular: No pedal edema Respiratory: No cyanosis, no use of accessory musculature GI: No organomegaly, abdomen is soft and non-tender Skin: No lesions in the area of chief complaint Neurologic: Sensation intact distally  Psychiatric: Patient is competent for consent with normal mood and affect Lymphatic: No axillary or cervical lymphadenopathy  MUSCULOSKELETAL: Examination of the left ankle reveals a small healing abrasion over the medial ankle.  She has extensive ecchymosis about the ankle.  She does have swelling, but skin wrinkling is present.  She has decreased sensation over the superficial peroneal nerve distribution.  She has intact sensation over the sural, saphenous, deep peroneal, and posterior tibial distributions.  Assessment: Left ankle fracture  Plan: Plan for Procedure(s): OPEN REDUCTION INTERNAL FIXATION (ORIF) ANKLE FRACTURE  Plan for ORIF left trimalleolar ankle fracture/dislocation.  Admit for 23-hour observation and physical therapy.  Discharge home when she clears physical therapy.   The risks, benefits, and alternatives were discussed with the patient. There are risks associated with the surgery including, but not limited to, problems with anesthesia (death), infection, differences in leg length/angulation/rotation, fracture of bones, loosening or failure of implants, malunion, nonunion, hematoma (blood accumulation) which may require surgical drainage, blood clots, pulmonary embolism, nerve injury (foot drop), and blood vessel injury. The patient understands these risks and elects to proceed.   Bertram Savin, MD Cell [phone]   08/30/2017 1:36 PM

## 2017-08-31 ENCOUNTER — Encounter: Payer: Self-pay | Admitting: Adult Health

## 2017-08-31 ENCOUNTER — Encounter (HOSPITAL_COMMUNITY): Payer: Self-pay | Admitting: Orthopedic Surgery

## 2017-08-31 ENCOUNTER — Other Ambulatory Visit: Payer: Self-pay | Admitting: Internal Medicine

## 2017-08-31 DIAGNOSIS — S82852A Displaced trimalleolar fracture of left lower leg, initial encounter for closed fracture: Secondary | ICD-10-CM | POA: Diagnosis not present

## 2017-08-31 MED ORDER — ASPIRIN EC 325 MG PO TBEC: 325.0000 mg | DELAYED_RELEASE_TABLET | Freq: Two times a day (BID) | ORAL | 0 refills | Status: DC

## 2017-08-31 MED ORDER — ENOXAPARIN SODIUM 40 MG/0.4ML ~~LOC~~ SOLN: 40.0000 mg | SUBCUTANEOUS | 0 refills | Status: DC

## 2017-08-31 MED ORDER — HYDROCODONE-ACETAMINOPHEN 7.5-325 MG PO TABS: 1.0000 | ORAL_TABLET | Freq: Four times a day (QID) | ORAL | 0 refills | Status: DC | PRN

## 2017-08-31 MED FILL — ATENOLOL 100 MG TABLET: 100 | 90 days supply | Qty: 90 | Fill #0

## 2017-08-31 MED FILL — buPROPion HCL ER (XL) 150 M: 150 | 90 days supply | Qty: 270 | Fill #1

## 2017-08-31 MED FILL — HYDROCODON-APAP 7.5-325: 7.5-325 | 6 days supply | Qty: 50 | Fill #0

## 2017-08-31 NOTE — Discharge Summary (Signed)
Physician Discharge Summary  Patient ID: [name] MRN: [id number] DOB/AGE: 1950/11/01 67 y.o.  Admit date: 08/30/2017 Discharge date: 08/31/2017  Admission Diagnoses:  Closed displaced trimalleolar fracture of left ankle  Discharge Diagnoses:  Principal Problem:   Closed displaced trimalleolar fracture of left ankle   Past Medical History:  Diagnosis Date  . Chronic kidney disease (CKD), stage III (moderate) (HCC)   . Diverticulosis   . Emphysema lung ([place])   . Helicobacter pylori (H. pylori)   . History of IBS   . IBS (irritable bowel syndrome)   . LBBB (left bundle branch block)   . Lower urinary tract infection 06/21/2017  . Migraines   . OCD (obsessive compulsive disorder)   . Prediabetes    pcp  . Renal disorder    AKI due to lisinpril, his. pyelonephritis,   . Sepsis ([place])   . Sleep apnea    no CPAP  . Vitamin D deficiency     Surgeries: Procedure(s): OPEN REDUCTION INTERNAL FIXATION (ORIF) ANKLE FRACTURE on 08/30/2017   Consultants (if any):   Discharged Condition: Improved  Hospital Course: [name] is an 67 y.o. female who was admitted 08/30/2017 with a diagnosis of Closed displaced trimalleolar fracture of left ankle and went to the operating room on 08/30/2017 and underwent the above named procedures.    She was given perioperative antibiotics:  Anti-infectives (From admission, onward)   Start     Dose/Rate Route Frequency Ordered Stop   08/31/17 0600  clindamycin (CLEOCIN) IVPB 900 mg     900 mg 100 mL/hr over 30 Minutes Intravenous On call to O.R. 08/30/17 1243 08/30/17 1506   08/30/17 2200  doxycycline (VIBRA-TABS) tablet 100 mg  Status:  Discontinued    Note to Pharmacy:  Take 1 capsule 2 x/day with food for 10 days     100 mg Oral Every 12 hours 08/30/17 1847 08/31/17 1411   08/30/17 2030  clindamycin (CLEOCIN) IVPB 600 mg  Status:  Discontinued     600 mg 100 mL/hr over 30 Minutes Intravenous Every 6 hours 08/30/17 1847 08/31/17 1411    08/30/17 1243  clindamycin (CLEOCIN) 900 MG/50ML IVPB    Note to Pharmacy:  Leandrew Koyanagi   : cabinet override      08/30/17 1243 08/30/17 1436    .  She was given sequential compression devices, and lovenox in house --> home on ASA for DVT prophylaxis.  She benefited maximally from the hospital stay and there were no complications.    Recent vital signs:  Vitals:   08/31/17 0350 08/31/17 0754  BP: (!) 83/71 101/71  Pulse: 64 72  Resp: 18 18  Temp: 98 F (36.7 C) 98.3 F (36.8 C)  SpO2: 98% 100%    Recent laboratory studies:  Lab Results  Component Value Date   HGB 12.8 08/30/2017   HGB 12.4 08/26/2017   HGB 12.9 06/22/2017   Lab Results  Component Value Date   WBC 16.7 (H) 08/30/2017   PLT 385 08/30/2017   Lab Results  Component Value Date   INR 1.31 06/17/2015   Lab Results  Component Value Date   NA 139 08/26/2017   K 4.6 08/26/2017   CL 102 08/26/2017   CO2 28 08/26/2017   BUN 33 (H) 08/26/2017   CREATININE 1.28 (H) 08/30/2017   GLUCOSE 106 (H) 08/26/2017    Discharge Medications:   Allergies as of 08/31/2017      Reactions   Ace Inhibitors Other (See  Comments)   Acute renal failure (lisinopril)   Cephalexin Anaphylaxis   Macrobid [nitrofurantoin] Other (See Comments)   Causes hyperthermia   Codeine Nausea Only   Latex Rash   Sulfa Antibiotics Other (See Comments)   Do not take per nephrology      Medication List    STOP taking these medications   acetaminophen 500 MG tablet Commonly known as:  TYLENOL   HYDROcodone-acetaminophen 5-325 MG tablet Commonly known as:  NORCO/VICODIN Replaced by:  HYDROcodone-acetaminophen 7.5-325 MG tablet   predniSONE 20 MG tablet Commonly known as:  DELTASONE     TAKE these medications   ALPRAZolam 0.5 MG tablet Commonly known as:  XANAX Take 1/2 to 1 tablet 3 x day if needed for anxiety or sleep   aspirin EC 325 MG tablet Take 1 tablet (325 mg total) by mouth 2 (two) times daily after a  meal. What changed:    medication strength  how much to take  when to take this   buPROPion 150 MG 24 hr tablet Commonly known as:  WELLBUTRIN XL TAKE 3 TABLETS BY MOUTH EVERY MORNING FOR MOOD/DEPRESSION   DICYCLOMINE HCL PO Take 10 mg by mouth 2 (two) times daily.   docusate sodium 100 MG capsule Commonly known as:  COLACE Take 100 mg by mouth 2 (two) times daily as needed for mild constipation.   doxycycline 100 MG capsule Commonly known as:  VIBRAMYCIN Take 1 capsule 2 x/day with food for 10 days   HYDROcodone-acetaminophen 7.5-325 MG tablet Commonly known as:  NORCO Take 1-2 tablets by mouth every 6 (six) hours as needed for severe pain. Replaces:  HYDROcodone-acetaminophen 5-325 MG tablet   hyoscyamine 0.125 MG SL tablet Commonly known as:  LEVSIN SL PLACE 1 TABLET (0.125 MG TOTAL) UNDER THE TONGUE EVERY 4 (FOUR) HOURS AS NEEDED.   levothyroxine 125 MCG tablet Commonly known as:  SYNTHROID, LEVOTHROID TAKE 1 TABLET BY MOUTH ONCE DAILY   Magnesium 250 MG Tabs Take 250 mg by mouth daily.   meclizine 25 MG tablet Commonly known as:  ANTIVERT TAKE 1 TABLET BY MOUTH 3 TIMES DAILY AS NEEDED FOR NAUSEA   methylphenidate 20 MG tablet Commonly known as:  RITALIN 1/2 to 1 tab 1 to 2 x / daily as needed for ADD or alertness   multivitamin with minerals tablet Take 1 tablet by mouth daily.   ondansetron 8 MG tablet Commonly known as:  ZOFRAN TAKE 1 TABLET 3 x  DAILY AS NEEDED FOR NAUSEA   pantoprazole 40 MG tablet Commonly known as:  PROTONIX Take 1 tablet (40 mg total) by mouth daily.   promethazine-dextromethorphan 6.25-15 MG/5ML syrup Commonly known as:  PROMETHAZINE-DM Take 5 mLs by mouth 4 (four) times daily as needed for cough.   ranitidine 300 MG tablet Commonly known as:  ZANTAC Take 1 to 2 tablets daily for heartburn & reflux to allow wean and transition from PPI / pantoprazole   sertraline 100 MG tablet Commonly known as:  ZOLOFT TAKE 1 TABLET  BY MOUTH ONCE DAILY FOR MOOD AND DEPRESSION   VITAMIN C PO Take 500 mg by mouth daily.   Vitamin D-3 5000 units Tabs Take 5,000 Units by mouth daily.     ASK your doctor about these medications   atenolol 100 MG tablet Commonly known as:  TENORMIN TAKE 1 TABLET BY MOUTH DAILY       Diagnostic Studies: Dg Chest 2 View  Result Date: 08/26/2017 CLINICAL DATA:  Preop EXAM: CHEST -  2 VIEW COMPARISON:  None. FINDINGS: The heart is upper normal in size. Interstitial prominence and increased AP diameter of the thorax are likely related to COPD. No consolidation or mass. No pneumothorax. No definite pleural effusion. Spondylitic changes in the spine without vertebral compression deformity. IMPRESSION: No active cardiopulmonary disease.  Chronic changes are noted. Electronically Signed   By: Marybelle Killings M.D.   On: 08/26/2017 16:23   Dg Tibia/fibula Left  Result Date: 08/19/2017 CLINICAL DATA:  Fall with deformity EXAM: LEFT TIBIA AND FIBULA - 2 VIEW COMPARISON:  None. FINDINGS: Proximal tibia and fibula appear intact. Comminuted displaced and angulated fracture distal fibula with multiple fracture fragments adjacent to the lateral malleolar tip. Acute comminuted medial malleolar fracture. Probable avulsion fractures off the anterior inferior cortex of the tibia. Dislocation at the ankle joint with lateral and posterior displacement of the talus with respect to the distal tibia. IMPRESSION: 1. Proximal fibula and tibia are intact. 2. Acute displaced and angulated fractures of the distal fibula and tibia with dislocation at the ankle joint. Electronically Signed   By: Donavan Foil M.D.   On: 08/19/2017 18:12   Dg Ankle Complete Left  Result Date: 08/30/2017 CLINICAL DATA:  ORIF left ankle. EXAM: DG C-ARM 61-120 MIN; LEFT ANKLE COMPLETE - 3+ VIEW COMPARISON:  08/19/2017 FINDINGS: Examination demonstrates reduction of patient's tibiotalar dislocation with normal alignment over the ankle mortise.  Fixation plate and screws. Patient's distal fibular fracture as well as posterior malleolar fractures with anatomic alignment over the fracture site and hardware intact. There are 2 orthopedic screws extending from inferior to superior bridging patient's medial malleolar fracture with hardware intact and normal anatomic alignment over the fracture site. IMPRESSION: Near anatomic alignment post internal fixation of trimalleolar fracture with hardware intact. Reduction of previously seen tibiotalar subluxation. Electronically Signed   By: Marin Olp M.D.   On: 08/30/2017 17:26   Dg Ankle Complete Left  Result Date: 08/19/2017 CLINICAL DATA:  S/p reduction left ankle EXAM: LEFT ANKLE COMPLETE - 3+ VIEW COMPARISON:  08/19/2017 FINDINGS: Status post reduction LEFT ankle. Ankle is imaged through splinting material. Detail is obscured. There has been interval closed reduction of the LEFT ankle following bimalleolar fracture. Remote bunionectomy. IMPRESSION: Status post closed reduction of the LEFT ankle fracture. Electronically Signed   By: Nolon Nations M.D.   On: 08/19/2017 19:13   Dg Ankle Complete Left  Result Date: 08/19/2017 CLINICAL DATA:  [place] with ankle deformity EXAM: LEFT ANKLE COMPLETE - 3+ VIEW COMPARISON:  None. FINDINGS: Acute comminuted fracture involving the distal shaft and metaphysis of the fibula with mild to moderate lateral angulation of distal fracture fragment and separation of fracture fragments by about 1.2 cm on the lateral view. Acute displaced medial malleolar fracture. Dislocation at the ankle joint with posterior and lateral displacement of the talus with respect to the distal tibia and fibula. There is mild-to-moderate angulation of the ankle and foot with respect to the distal shafts of the fibula and tibia. Fixating screw within the first metatarsal. Multiple fracture fragments adjacent to the tip of the fibular malleolus. IMPRESSION: Acute comminuted fractures involving the  distal fibula and tibia with dislocation of the ankle joint, talus is positioned lateral and posterior with respect to the distal tibia. Electronically Signed   By: Donavan Foil M.D.   On: 08/19/2017 18:09   Ct Ankle Left Wo Contrast  Result Date: 08/28/2017 CLINICAL DATA:  Left ankle fracture subluxations sustained during fall 1 week ago. Closed reduction.  Preoperative evaluation. EXAM: CT OF THE LEFT ANKLE WITHOUT CONTRAST TECHNIQUE: Multidetector CT imaging of the left ankle was performed according to the standard protocol. Multiplanar CT image reconstructions were also generated. COMPARISON:  Radiographs 08/19/2017. FINDINGS: Bones/Joint/Cartilage The ankle remains splinted. There is an extensively comminuted fracture of the distal fibular metadiaphysis. There is an oblique component which measures more than 8 cm in length and results in 7 mm of posterior displacement proximally. There are small acute avulsion fractures from the anterior aspect of the lateral malleolus. In addition, there are underlying ossicles in this area. The distal tibiofibular joint is widened with interposed fracture fragments. There is a moderate size ankle joint effusion. There is a comminuted intra-articular fracture of the distal tibia. Posterior component involves the tibial plafond and results in up to 3 mm of impaction of the articular surface posteriorly. There is a medial component which extends through the base of the medial malleolus and is mildly comminuted. Relative to the tibial plafond, the talar dome is slightly subluxed laterally. There is no residual dislocation. The tarsal bones and visualized metatarsals appear intact. Previous surgery at the base of the 1st metatarsal noted. Ligaments Suboptimally assessed by CT. Probable lateral ankle ligamentous injuries. Muscles and Tendons The ankle tendons appear grossly intact and normally located. There is a small amount of medial sheath fluid. Soft tissues Mild  periarticular soft tissue edema without focal fluid collection. IMPRESSION: 1. Comminuted and mildly displaced fracture of the distal fibula. 2. Comminuted fracture of the distal tibia, involving the base of the medial malleolus and posterior aspect of the tibial plafond. 3. Persistent mild widening of the ankle mortise with interposed fracture fragments and slight lateral subluxation of the talus. No evidence of talar dome injury or other tarsal bone fracture. Electronically Signed   By: Richardean Sale M.D.   On: 08/28/2017 09:08   Dg Ankle Left Port  Result Date: 08/30/2017 CLINICAL DATA:  Postop day 0 ORIF trimalleolar LEFT ankle fracture/dislocation which occurred on 08/19/2017. EXAM: PORTABLE LEFT ANKLE - 2 VIEW COMPARISON:  Intraoperative LEFT ankle x-rays earlier today. LEFT ankle CT 08/27/2017. LEFT ankle x-rays 08/19/2017. FINDINGS: Examination is performed in fiberglass splint material. ORIF of the trimalleolar fracture dislocation with anatomic alignment of the ankle joint. No acute complicating features. IMPRESSION: Anatomic alignment post ORIF of the trimalleolar fracture/dislocation involving the RIGHT ankle. No acute complicating features. Electronically Signed   By: Evangeline Dakin M.D.   On: 08/30/2017 17:46   Dg C-arm 1-60 Min  Result Date: 08/30/2017 CLINICAL DATA:  ORIF left ankle. EXAM: DG C-ARM 61-120 MIN; LEFT ANKLE COMPLETE - 3+ VIEW COMPARISON:  08/19/2017 FINDINGS: Examination demonstrates reduction of patient's tibiotalar dislocation with normal alignment over the ankle mortise. Fixation plate and screws. Patient's distal fibular fracture as well as posterior malleolar fractures with anatomic alignment over the fracture site and hardware intact. There are 2 orthopedic screws extending from inferior to superior bridging patient's medial malleolar fracture with hardware intact and normal anatomic alignment over the fracture site. IMPRESSION: Near anatomic alignment post internal  fixation of trimalleolar fracture with hardware intact. Reduction of previously seen tibiotalar subluxation. Electronically Signed   By: Marin Olp M.D.   On: 08/30/2017 17:26   Dg C-arm 1-60 Min  Result Date: 08/30/2017 CLINICAL DATA:  ORIF left ankle. EXAM: DG C-ARM 61-120 MIN; LEFT ANKLE COMPLETE - 3+ VIEW COMPARISON:  08/19/2017 FINDINGS: Examination demonstrates reduction of patient's tibiotalar dislocation with normal alignment over the ankle mortise. Fixation plate and screws.  Patient's distal fibular fracture as well as posterior malleolar fractures with anatomic alignment over the fracture site and hardware intact. There are 2 orthopedic screws extending from inferior to superior bridging patient's medial malleolar fracture with hardware intact and normal anatomic alignment over the fracture site. IMPRESSION: Near anatomic alignment post internal fixation of trimalleolar fracture with hardware intact. Reduction of previously seen tibiotalar subluxation. Electronically Signed   By: Marin Olp M.D.   On: 08/30/2017 17:26    Disposition:    Discharge Instructions    Call MD / Call 911   Complete by:  As directed    If you experience chest pain or shortness of breath, CALL 911 and be transported to the hospital emergency room.  If you develope a fever above 101 F, pus (white drainage) or increased drainage or redness at the wound, or calf pain, call your surgeon's office.   Constipation Prevention   Complete by:  As directed    Drink plenty of fluids.  Prune juice may be helpful.  You may use a stool softener, such as Colace (over the counter) 100 mg twice a day.  Use MiraLax (over the counter) for constipation as needed.   Diet - low sodium heart healthy   Complete by:  As directed    Discharge instructions   Complete by:  As directed    Non weight bearing left leg Elevate toes above nose on blankets Keep splint clean and dry. Do not remove.   Driving restrictions   Complete by:   As directed    No driving for 6 weeks   Increase activity slowly as tolerated   Complete by:  As directed    Lifting restrictions   Complete by:  As directed    No lifting for 6 weeks      Follow-up Information    Tamberly Pomplun, Aaron Edelman, MD. Schedule an appointment as soon as possible for a visit in 2 weeks.   Specialty:  Orthopedic Surgery Why:  For suture removal Contact information: [address] [place] [address] [phone]            Signed: Hilton Cork Cailen Texeira 09/01/2017, 11:48 AM

## 2017-08-31 NOTE — Evaluation (Addendum)
Occupational Therapy Evaluation and Discharge Patient Details Name: [name] MRN: [id number] DOB: Sep 03, 1950 Today's Date: 08/31/2017    History of Present Illness Pt is a 67 y.o. female who is s/p ORIF L trimalleolar ankle fracture. PMH significant for chronic kidney disease, diverticulosis, emphysema lung, helicobacter pylori, IBS, left bundle branch block, lower urinary tract infection, migraines, OCD, prediabetes, renal disorder, sepsis, sleep apnea, and vitamin D deficiency.    Clinical Impression   Pt has been managing with NWB LLE since initial fracture in April. Pt has had supervision at home for the majority of the day from family. She has been "bumping" up the steps in seated position to her bathroom for wash-ups and toileting tasks and has been able to complete ADL with modified independence.  Pt currently requiring supervision for ambulating toilet transfers, cues to lower to commode safely, increased time for all ADL participation, and supervision for LB ADL tasks. She demonstrates decreased balance during all standing tasks and poor depth perception (awaiting cataract removal) further impacts her instability. Pt would benefit from 24 hour assistance post-acute D/C to ensure safety at least initially as she will need to navigate steps up to reach her toilet. She plans to continue with wash-ups rather than attempt showering at this time. Pt verbalized understanding of OT recommendations; however, she reports that she will not have 24 hour assistance. Further recommended assistance initially when navigating steps and bathing tasks and pt reports this can be arranged. Pt verbalizes understanding of all education topics and planning to D/C home today. No further acute OT needs identified and OT will sign off.     Follow Up Recommendations  No OT follow up;Supervision/Assistance - 24 hour    Equipment Recommendations  None recommended by OT    Recommendations for Other Services        Precautions / Restrictions Precautions Precaution Comments: NWB LLE  Restrictions Weight Bearing Restrictions: Yes LLE Weight Bearing: Non weight bearing      Mobility Bed Mobility Overal bed mobility: Independent                Transfers Overall transfer level: Needs assistance Equipment used: Rolling walker (2 wheeled) Transfers: Sit to/from Stand Sit to Stand: Supervision         General transfer comment: Supervision for safety    Balance Overall balance assessment: Needs assistance Sitting-balance support: No upper extremity supported;Feet supported Sitting balance-Leahy Scale: Good     Standing balance support: Bilateral upper extremity supported;During functional activity;No upper extremity supported Standing balance-Leahy Scale: Fair Standing balance comment: Relies on B UE support for dynamic standing tasks.                            ADL either performed or assessed with clinical judgement   ADL Overall ADL's : Needs assistance/impaired Eating/Feeding: Set up;Sitting   Grooming: Supervision/safety;Standing   Upper Body Bathing: Set up;Sitting   Lower Body Bathing: Supervison/ safety;Sit to/from stand   Upper Body Dressing : Set up;Sitting   Lower Body Dressing: Supervision/safety;Sit to/from stand   Toilet Transfer: Supervision/safety;Ambulation;RW;Regular Glass blower/designer Details (indicate cue type and reason): Requiring increased verbal cues to avoid pulling on RW and allowing it to collapse to commode.  Toileting- Clothing Manipulation and Hygiene: Supervision/safety;Sit to/from stand       Functional mobility during ADLs: Supervision/safety;Rolling walker General ADL Comments: Pt with significantly limited activity tolerance for ADL. She required cues to safely complete toilet transfers  and dressing tasks. Unstable on her feet and requires leaning on wall for stability. Educated concerning use of chair once "bumped" up  stairs for toileting and bathing. Pt without toilet on main level and will use BSC for urination but needs to go upstairs for bowel movement.      Vision Baseline Vision/History: Wears glasses;Cataracts Wears Glasses: At all times Patient Visual Report: Other (comment)(needs cataracts removed) Vision Assessment?: Yes Eye Alignment: Within Functional Limits Ocular Range of Motion: Within Functional Limits Alignment/Gaze Preference: Within Defined Limits Tracking/Visual Pursuits: Able to track stimulus in all quads without difficulty Visual Fields: No apparent deficits Depth Perception: Overshoots;Undershoots Additional Comments: Pt with poor depth perception and needs cataracts removed. Feel visual deficits potentially impacting her balance.      Perception     Praxis      Pertinent Vitals/Pain Pain Assessment: 0-10 Pain Score: 4  Pain Location: L ankle Pain Descriptors / Indicators: Operative site guarding;Sore Pain Intervention(s): Limited activity within patient's tolerance;Monitored during session;Repositioned     Hand Dominance     Extremity/Trunk Assessment Upper Extremity Assessment Upper Extremity Assessment: Overall WFL for tasks assessed   Lower Extremity Assessment Lower Extremity Assessment: LLE deficits/detail LLE Deficits / Details: L ankle and lower leg immobilized.        Communication Communication Communication: No difficulties   Cognition Arousal/Alertness: Awake/alert Behavior During Therapy: WFL for tasks assessed/performed Overall Cognitive Status: Within Functional Limits for tasks assessed                                     General Comments  Educated pt that OT recommends 24 hour assistance.     Exercises     Shoulder Instructions      Home Living Family/patient expects to be discharged to:: Private residence Living Arrangements: Alone Available Help at Discharge: Family;Available PRN/intermittently Type of Home:  House Home Access: Stairs to enter Entrance Stairs-Number of Steps: 3   Home Layout: Multi-level;Bed/bath upstairs(no bath on main level) Alternate Level Stairs-Number of Steps: 6 upstairs, 8 downstairs   Bathroom Shower/Tub: Tub/shower unit;Walk-in shower(tub upstairs and walk-in downstairs)   Bathroom Toilet: Standard     Home Equipment: Walker - standard;Bedside commode;Hand held shower head          Prior Functioning/Environment Level of Independence: Independent with assistive device(s)        Comments: Has been "bumping" up stairs to bathroom. Doing wash ups seated on commode. Family there most of the time prior to surgery. Will not have 24 hour assistance at home.         OT Problem List: Decreased strength;Decreased activity tolerance;Impaired balance (sitting and/or standing);Decreased safety awareness;Decreased knowledge of use of DME or AE;Decreased knowledge of precautions;Pain      OT Treatment/Interventions:      OT Goals(Current goals can be found in the care plan section) Acute Rehab OT Goals Patient Stated Goal: to go home OT Goal Formulation: With patient  OT Frequency:     Barriers to D/C:            Co-evaluation              AM-PAC PT "6 Clicks" Daily Activity     Outcome Measure Help from another person eating meals?: None Help from another person taking care of personal grooming?: A Little Help from another person toileting, which includes using toliet, bedpan, or urinal?: A Little Help from another  person bathing (including washing, rinsing, drying)?: A Little Help from another person to put on and taking off regular upper body clothing?: None Help from another person to put on and taking off regular lower body clothing?: A Little 6 Click Score: 20   End of Session Equipment Utilized During Treatment: Rolling walker Nurse Communication: Mobility status  Activity Tolerance: Patient tolerated treatment well Patient left: in bed;with  call bell/phone within reach(seated at EOB)  OT Visit Diagnosis: Other abnormalities of gait and mobility (R26.89);Pain Pain - Right/Left: Left Pain - part of body: Ankle and joints of foot                Time: 5686-1683 OT Time Calculation (min): 22 min Charges:  OT General Charges $OT Visit: 1 Visit OT Evaluation $OT Eval Moderate Complexity: 1 Mod G-Codes:     Norman Herrlich, MS OTR/L  Pager: Lauren A Female Schultz 08/31/2017, 10:29 AM

## 2017-08-31 NOTE — Progress Notes (Signed)
Patient alert and oriented, mae's well, voiding adequate amount of urine, swallowing without difficulty, no c/o pain at time of discharge. Patient discharged home with family. Script and discharged instructions given to patient. Patient and family stated understanding of instructions given. Patient has an appointment with Dr. Delfino Lovett

## 2017-08-31 NOTE — Progress Notes (Signed)
    Subjective:  Patient reports pain as mild to moderate.  Denies N/V/CP/SOB. States block hasn't worn off.  Objective:   VITALS:   Vitals:   08/30/17 1843 08/30/17 1951 08/30/17 2312 08/31/17 0350  BP: 137/89 90/67 (!) 87/56 (!) 83/71  Pulse: 66 76 64 64  Resp: 16 18 18 18   Temp: 97.8 F (36.6 C)  97.8 F (36.6 C) 98 F (36.7 C)  TempSrc: Oral  Oral Oral  SpO2: 97% 94% 98% 98%  Weight:      Height:        NAD ABD soft Intact pulses distally Splint C/D/I Toes warn Unable to assess motor & sensory due to block  Lab Results  Component Value Date   WBC 16.7 (H) 08/30/2017   HGB 12.8 08/30/2017   HCT 39.8 08/30/2017   MCV 94.8 08/30/2017   PLT 385 08/30/2017   BMET    Component Value Date/Time   NA 139 08/26/2017 1131   K 4.6 08/26/2017 1131   CL 102 08/26/2017 1131   CO2 28 08/26/2017 1131   GLUCOSE 106 (H) 08/26/2017 1131   BUN 33 (H) 08/26/2017 1131   CREATININE 1.28 (H) 08/30/2017 2015   CREATININE 1.22 (H) 07/22/2017 1025   CALCIUM 9.2 08/26/2017 1131   GFRNONAA 43 (L) 08/30/2017 2015   GFRNONAA 46 (L) 07/22/2017 1025   GFRAA 49 (L) 08/30/2017 2015   GFRAA 53 (L) 07/22/2017 1025     Assessment/Plan: 1 Day Post-Op   Active Problems:   Closed displaced trimalleolar fracture of left ankle   WBAT with walker DVT ppx: Lovenox in house --> home on ASA, SCDs, TEDS PO pain control PT/OT Dispo: D/C home today with HEP   [name] 08/31/2017, 7:47 AM   Rod Can, MD Cell [phone]

## 2017-08-31 NOTE — Evaluation (Signed)
Physical Therapy Evaluation Patient Details Name: [name] MRN: [id number] DOB: 02-21-1951 Today's Date: 08/31/2017   History of Present Illness  Pt is a 67 y.o. female who is s/p ORIF L trimalleolar ankle fracture. PMH significant for chronic kidney disease, diverticulosis, emphysema lung, helicobacter pylori, left bundle branch block, renal disorder.  Clinical Impression  Pt admitted with above diagnosis. Pt currently with functional limitations due to the deficits listed below (see PT Problem List). At the time of PT eval, pt was able to perform transfers and ambulation with up to supervision for safety. Pt required hands-on guarding with stair training, and was educated in multiple stair negotiation with RW (handout provided). Did not feel safe taking pt into stairwell to practice as pt reported dizziness with any OOB mobility. BP during this time was 89/67 and RN aware. Pt will benefit from skilled PT to increase their independence and safety with mobility to allow discharge to the venue listed below.     Follow Up Recommendations No PT follow up;Supervision/Assistance - 24 hour    Equipment Recommendations  Rolling walker with 5" wheels    Recommendations for Other Services       Precautions / Restrictions Precautions Precautions: Fall Precaution Comments: NWB LLE  Restrictions Weight Bearing Restrictions: Yes LLE Weight Bearing: Non weight bearing      Mobility  Bed Mobility Overal bed mobility: Independent                Transfers Overall transfer level: Needs assistance Equipment used: Rolling walker (2 wheeled) Transfers: Sit to/from Stand Sit to Stand: Supervision         General transfer comment: Supervision for safety. VC's for hand placement on seated surface for safety.   Ambulation/Gait Ambulation/Gait assistance: Supervision Ambulation Distance (Feet): 30 Feet Assistive device: Rolling walker (2 wheeled) Gait Pattern/deviations: Step-to  pattern;Decreased stride length;Trunk flexed Gait velocity: Decreased Gait velocity interpretation: <1.8 ft/sec, indicate of risk for recurrent falls General Gait Details: VC's for improved posture, closer walker proximity, and general sequencing/safety with the RW. Distance limited due to pt dizziness with OOB mobility.   Stairs Stairs: Yes Stairs assistance: Min guard Stair Management: No rails;Backwards;With walker Number of Stairs: 1 General stair comments: Pt was instructed in backwards negotiation of curb step. Limited due to dizziness. Pt was given handout to negotiate >1 step with walker and was verbally educated. Hesitant to take pt into stairwell to practice due to symptomatic hypotension. Pt reports she feels comfortable and can always bump up on her bottom as she has been doing. PT instructed pt in getting up from a seated position if she chooses to negotiate stairs that way.   Wheelchair Mobility    Modified Rankin (Stroke Patients Only)       Balance Overall balance assessment: Needs assistance Sitting-balance support: No upper extremity supported;Feet supported Sitting balance-Leahy Scale: Good     Standing balance support: Bilateral upper extremity supported;During functional activity;No upper extremity supported Standing balance-Leahy Scale: Fair Standing balance comment: Relies on B UE support for dynamic standing tasks.                              Pertinent Vitals/Pain Pain Assessment: 0-10 Pain Score: 4  Pain Location: L ankle Pain Descriptors / Indicators: Operative site guarding;Sore Pain Intervention(s): Monitored during session    Home Living Family/patient expects to be discharged to:: Private residence Living Arrangements: Alone Available Help at Discharge: Family;Available PRN/intermittently Type  of Home: House Home Access: Stairs to enter   [employer] of Steps: 3 Home Layout: Multi-level;Bed/bath upstairs(no bath on  main level) Home Equipment: Walker - standard;Bedside commode;Hand held shower head      Prior Function Level of Independence: Independent with assistive device(s)         Comments: Has been "bumping" up stairs to bathroom. Doing wash ups seated on commode. Family there most of the time prior to surgery. Will not have 24 hour assistance at home.      Hand Dominance   Dominant Hand: Right    Extremity/Trunk Assessment   Upper Extremity Assessment Upper Extremity Assessment: Overall WFL for tasks assessed    Lower Extremity Assessment Lower Extremity Assessment: LLE deficits/detail LLE Deficits / Details: L ankle and lower leg immobilized.  LLE: Unable to fully assess due to immobilization LLE Sensation: (Block in place)    Cervical / Trunk Assessment Cervical / Trunk Assessment: Other exceptions Cervical / Trunk Exceptions: Forward head/rounded shoulder posture  Communication   Communication: No difficulties  Cognition Arousal/Alertness: Awake/alert Behavior During Therapy: WFL for tasks assessed/performed Overall Cognitive Status: Within Functional Limits for tasks assessed                                        General Comments General comments (skin integrity, edema, etc.): Educated pt that OT recommends 24 hour assistance.     Exercises     Assessment/Plan    PT Assessment Patient needs continued PT services  PT Problem List Decreased strength;Decreased range of motion;Decreased activity tolerance;Decreased balance;Decreased mobility;Decreased knowledge of use of DME;Decreased safety awareness;Decreased knowledge of precautions       PT Treatment Interventions DME instruction;Stair training;Gait training;Functional mobility training;Therapeutic activities;Therapeutic exercise;Neuromuscular re-education;Patient/family education    PT Goals (Current goals can be found in the Care Plan section)  Acute Rehab PT Goals Patient Stated Goal: to go  home PT Goal Formulation: With patient Time For Goal Achievement: 09/07/17 Potential to Achieve Goals: Good    Frequency Min 5X/week   Barriers to discharge        Co-evaluation               AM-PAC PT "6 Clicks" Daily Activity  Outcome Measure Difficulty turning over in bed (including adjusting bedclothes, sheets and blankets)?: None Difficulty moving from lying on back to sitting on the side of the bed? : A Little Difficulty sitting down on and standing up from a chair with arms (e.g., wheelchair, bedside commode, etc,.)?: A Little Help needed moving to and from a bed to chair (including a wheelchair)?: A Little Help needed walking in hospital room?: A Little Help needed climbing 3-5 steps with a railing? : A Little 6 Click Score: 19    End of Session Equipment Utilized During Treatment: Gait belt Activity Tolerance: Treatment limited secondary to medical complications (Comment)(Soft BP - symptomatic) Patient left: in bed;with call bell/phone within reach Nurse Communication: Mobility status PT Visit Diagnosis: Unsteadiness on feet (R26.81);Difficulty in walking, not elsewhere classified (R26.2)    Time: 1610-9604 PT Time Calculation (min) (ACUTE ONLY): 27 min   Charges:   PT Evaluation $PT Eval Moderate Complexity: 1 Mod PT Treatments $Gait Training: 8-22 mins   PT G Codes:        Rolinda Roan, PT, DPT Acute Rehabilitation Services Pager: [phone]   Thelma Comp 08/31/2017, 11:08 AM

## 2017-09-02 ENCOUNTER — Telehealth: Payer: Self-pay | Admitting: *Deleted

## 2017-09-02 NOTE — Telephone Encounter (Signed)
Called patient on 09/02/2017 , 10:16 AM in an attempt to reach the patient for a hospital follow up.   Admit date: 08/30/17 Discharge: 08/31/17   She does not  have any questions or concerns about medications from the hospital admission. The patient's medications were reviewed over the phone, they were counseled to bring in all current medications to the hospital follow up visit.   I advised the patient to call if any questions or concerns arise about the hospital admission or medications    Home health was not  started in the hospital.  All questions were answered and a follow up appointment was made.   Prior to Admission medications   Medication Sig Start Date End Date Taking? Authorizing Provider  ALPRAZolam Duanne Moron) 0.5 MG tablet Take 1/2 to 1 tablet 3 x day if needed for anxiety or sleep 04/15/14   Unk [name], MD  Ascorbic Acid (VITAMIN C PO) Take 500 mg by mouth daily.     [provider]  aspirin EC 325 MG tablet Take 1 tablet (325 mg total) by mouth 2 (two) times daily after a meal. 08/31/17   Swinteck, Aaron Edelman, MD  atenolol (TENORMIN) 100 MG tablet TAKE 1 TABLET BY MOUTH DAILY 08/31/17   Unk [name], MD  buPROPion (WELLBUTRIN XL) 150 MG 24 hr tablet TAKE 3 TABLETS BY MOUTH EVERY MORNING FOR MOOD/DEPRESSION 05/18/17   Liane Comber, NP  Cholecalciferol (VITAMIN D-3) 5000 UNITS TABS Take 5,000 Units by mouth daily.     [provider]  DICYCLOMINE HCL PO Take 10 mg by mouth 2 (two) times daily.     [provider]  docusate sodium (COLACE) 100 MG capsule Take 100 mg by mouth 2 (two) times daily as needed for mild constipation.    [provider]  doxycycline (VIBRAMYCIN) 100 MG capsule Take 1 capsule 2 x/day with food for 10 days 08/24/17   Liane Comber, NP  HYDROcodone-acetaminophen (NORCO) 7.5-325 MG tablet Take 1-2 tablets by mouth every 6 (six) hours as needed for severe pain. 08/31/17   Swinteck, Aaron Edelman, MD  hyoscyamine (LEVSIN SL) 0.125 MG SL  tablet PLACE 1 TABLET (0.125 MG TOTAL) UNDER THE TONGUE EVERY 4 (FOUR) HOURS AS NEEDED. 04/26/17   Unk [name], MD  levothyroxine (SYNTHROID, LEVOTHROID) 125 MCG tablet TAKE 1 TABLET BY MOUTH ONCE DAILY 05/18/17   Liane Comber, NP  Magnesium 250 MG TABS Take 250 mg by mouth daily.    [provider]  meclizine (ANTIVERT) 25 MG tablet TAKE 1 TABLET BY MOUTH 3 TIMES DAILY AS NEEDED FOR NAUSEA 03/08/17   Unk [name], MD  methylphenidate (RITALIN) 20 MG tablet 1/2 to 1 tab 1 to 2 x / daily as needed for ADD or alertness 03/22/17 04/22/17  Unk [name], MD  Multiple Vitamins-Minerals (MULTIVITAMIN WITH MINERALS) tablet Take 1 tablet by mouth daily.    [provider]  ondansetron (ZOFRAN) 8 MG tablet TAKE 1 TABLET 3 x  DAILY AS NEEDED FOR NAUSEA 12/22/16   Unk [name], MD  pantoprazole (PROTONIX) 40 MG tablet Take 1 tablet (40 mg total) by mouth daily. Patient not taking: Reported on 08/24/2017 12/22/16 12/22/17  Unk [name], MD  promethazine-dextromethorphan (PROMETHAZINE-DM) 6.25-15 MG/5ML syrup Take 5 mLs by mouth 4 (four) times daily as needed for cough. 08/24/17   Liane Comber, NP  ranitidine (ZANTAC) 300 MG tablet Take 1 to 2 tablets daily for heartburn & reflux to allow wean and transition from PPI / pantoprazole 09/01/16   Unk [name], MD  sertraline (ZOLOFT) 100 MG tablet TAKE 1 TABLET BY MOUTH ONCE DAILY FOR MOOD AND DEPRESSION 04/26/17   Unk [name], MD

## 2017-09-07 NOTE — Progress Notes (Signed)
Hospital follow up  Assessment and Plan: Hospital visit follow up for : closed displaced trimalleolar fracture of left ankle repair by ORIF Hospital discharge meds were reviewed, and reconciled with the patient.   Closed displaced trimalleolar fracture of left ankle, sequela Follow up as scheduled with Dr. Lyla Glassing next week Doing well without issues Reports doing well without pain  Essential hypertension Continue medication Monitor blood pressure at home; call if consistently over 130/80 Continue DASH diet.   Reminder to go to the ER if any CP, SOB, nausea, dizziness, severe HA, changes vision/speech, left arm numbness and tingling and jaw pain.  Hypothyroidism, unspecified type continue medications the same pending lab results reminded to take on an empty stomach 30-76mins before food.  check TSH level -     TSH  CKD Stage 3 (GFR 55 ml/min) Increase fluids, avoid NSAIDS, monitor sugars, will monitor -     COMPLETE METABOLIC PANEL WITH GFR  Vitamin D deficiency -     VITAMIN D 25 Hydroxy (Vit-D Deficiency, Fractures)  Thrombocytopenia (HCC) -     CBC with Differential/Platelet  Other abnormal glucose -     COMPLETE METABOLIC PANEL WITH GFR  Medication management -     CBC with Differential/Platelet -     COMPLETE METABOLIC PANEL WITH GFR  There are no discontinued medications.  Over 40 minutes of exam, counseling, chart review, and complex, high/moderate level critical decision making was performed this visit.   Future Appointments  Date Time Provider [place]  05/02/2018  2:00 PM Unk [name], MD [facility] None  06/22/2018  2:30 PM Liane Comber, NP [facility] None      HPI 67 y.o.female presents for follow up for transition from recent hospitalization or SNIF stay. Admit date to the hospital was 08/30/17, patient was discharged from the hospital on 08/31/17 and our clinical staff contacted the office the day after discharge to set up a follow up  appointment. The discharge summary, medications, and diagnostic test results were reviewed before meeting with the patient. The patient was admitted for: left ankle trimalleolar fracture.   [name] is an 67 y.o. female who was admitted 08/30/2017 with a diagnosis of Closed displaced trimalleolar fracture of left ankle after mechanical fall on 08/19/2017 and went to the operating room on 08/30/2017 and underwent OPEN REDUCTION INTERNAL FIXATION (ORIF) ANKLE FRACTURE on 08/30/2017. She tolerated procedure and perioperative treatments including IV clindamycin.   She was discharged with norco but has not needed and has 2 week follow up scheduled with Dr. Lyla Glassing (surgeon) next week. Takes tylenol if needed. She is doing fairly at home with daughter to help her.   Home health is not involved.   Images while in the hospital: Ct Ankle Left Wo Contrast  Result Date: 08/28/2017 CLINICAL DATA:  Left ankle fracture subluxations sustained during fall 1 week ago. Closed reduction. Preoperative evaluation. EXAM: CT OF THE LEFT ANKLE WITHOUT CONTRAST TECHNIQUE: Multidetector CT imaging of the left ankle was performed according to the standard protocol. Multiplanar CT image reconstructions were also generated. COMPARISON:  Radiographs 08/19/2017. FINDINGS: Bones/Joint/Cartilage The ankle remains splinted. There is an extensively comminuted fracture of the distal fibular metadiaphysis. There is an oblique component which measures more than 8 cm in length and results in 7 mm of posterior displacement proximally. There are small acute avulsion fractures from the anterior aspect of the lateral malleolus. In addition, there are underlying ossicles in this area. The distal tibiofibular joint is widened with interposed fracture fragments.  There is a moderate size ankle joint effusion. There is a comminuted intra-articular fracture of the distal tibia. Posterior component involves the tibial plafond and results in up to 3 mm of  impaction of the articular surface posteriorly. There is a medial component which extends through the base of the medial malleolus and is mildly comminuted. Relative to the tibial plafond, the talar dome is slightly subluxed laterally. There is no residual dislocation. The tarsal bones and visualized metatarsals appear intact. Previous surgery at the base of the 1st metatarsal noted. Ligaments Suboptimally assessed by CT. Probable lateral ankle ligamentous injuries. Muscles and Tendons The ankle tendons appear grossly intact and normally located. There is a small amount of medial sheath fluid. Soft tissues Mild periarticular soft tissue edema without focal fluid collection. IMPRESSION: 1. Comminuted and mildly displaced fracture of the distal fibula. 2. Comminuted fracture of the distal tibia, involving the base of the medial malleolus and posterior aspect of the tibial plafond. 3. Persistent mild widening of the ankle mortise with interposed fracture fragments and slight lateral subluxation of the talus. No evidence of talar dome injury or other tarsal bone fracture. Electronically Signed   By: Richardean Sale M.D.   On: 08/28/2017 09:08      She is also due for chronic care follow up today  she has a diagnosis of depression/anxiety and is currently on zoloft 100 mg daily, xanax 0.25-0.5 mg TID PRN, reports symptoms are well controlled on current regimen. she currently taking 0.25 mg typically once a day, but has days that she skips or needs BID.   She has dx of ADD and is prescribed ritalin 20 mg but has not been taking while she is at home due to recent injury.    she has not been working on diet. Wt Readings from Last 3 Encounters:  08/30/17 159 lb (72.1 kg)  08/26/17 159 lb (72.1 kg)  08/19/17 159 lb (72.1 kg)   Her blood pressure has been controlled at home, today their BP is BP: 90/63  She does not workout. She denies chest pain, shortness of breath, dizziness.   She is not on cholesterol  medication. Her cholesterol is at goal. The cholesterol last visit was:   Lab Results  Component Value Date   CHOL 167 06/22/2017   HDL 55 06/22/2017   LDLCALC 93 06/22/2017   TRIG 95 06/22/2017   CHOLHDL 3.0 06/22/2017    She has not been working on diet and exercise for glucose management, and denies foot ulcerations, increased appetite, nausea, paresthesia of the feet, polydipsia, polyuria, visual disturbances, vomiting and weight loss. Last A1C in the office was:  Lab Results  Component Value Date   HGBA1C 5.5 08/26/2017   Patient is on Vitamin D supplement and at goal at last check:    Lab Results  Component Value Date   VD25OH 75 03/22/2017        Past Medical History:  Diagnosis Date  . Chronic kidney disease (CKD), stage III (moderate) (HCC)   . Diverticulosis   . Emphysema lung ([place])   . Helicobacter pylori (H. pylori)   . History of IBS   . IBS (irritable bowel syndrome)   . LBBB (left bundle branch block)   . Lower urinary tract infection 06/21/2017  . Migraines   . OCD (obsessive compulsive disorder)   . Prediabetes    pcp  . Renal disorder    AKI due to lisinpril, his. pyelonephritis,   . Sepsis ([place])   .  Sleep apnea    no CPAP  . Vitamin D deficiency      Allergies  Allergen Reactions  . Ace Inhibitors Other (See Comments)    Acute renal failure (lisinopril)  . Cephalexin Anaphylaxis  . Macrobid [Nitrofurantoin] Other (See Comments)    Causes hyperthermia   . Codeine Nausea Only  . Latex Rash  . Sulfa Antibiotics Other (See Comments)    Do not take per nephrology      Current Outpatient Medications on File Prior to Visit  Medication Sig Dispense Refill  . ALPRAZolam (XANAX) 0.5 MG tablet Take 1/2 to 1 tablet 3 x day if needed for anxiety or sleep 90 tablet 2  . Ascorbic Acid (VITAMIN C PO) Take 500 mg by mouth daily.     Marland Kitchen aspirin EC 325 MG tablet Take 1 tablet (325 mg total) by mouth 2 (two) times daily after a meal. (Patient taking  differently: Take 81 mg by mouth 2 (two) times daily after a meal. ) 60 tablet 0  . atenolol (TENORMIN) 100 MG tablet TAKE 1 TABLET BY MOUTH DAILY 90 tablet 1  . buPROPion (WELLBUTRIN XL) 150 MG 24 hr tablet TAKE 3 TABLETS BY MOUTH EVERY MORNING FOR MOOD/DEPRESSION 270 tablet 1  . Cholecalciferol (VITAMIN D-3) 5000 UNITS TABS Take 5,000 Units by mouth daily.     Marland Kitchen docusate sodium (COLACE) 100 MG capsule Take 100 mg by mouth 2 (two) times daily as needed for mild constipation.    . hyoscyamine (LEVSIN SL) 0.125 MG SL tablet PLACE 1 TABLET (0.125 MG TOTAL) UNDER THE TONGUE EVERY 4 (FOUR) HOURS AS NEEDED. (Patient taking differently: Place 0.125 mg under the tongue 2 (two) times daily. ) 100 tablet 0  . levothyroxine (SYNTHROID, LEVOTHROID) 125 MCG tablet TAKE 1 TABLET BY MOUTH ONCE DAILY 90 tablet 1  . Magnesium 250 MG TABS Take 250 mg by mouth daily.    . meclizine (ANTIVERT) 25 MG tablet TAKE 1 TABLET BY MOUTH 3 TIMES DAILY AS NEEDED FOR NAUSEA 90 tablet 1  . Multiple Vitamins-Minerals (MULTIVITAMIN WITH MINERALS) tablet Take 1 tablet by mouth daily.    . ondansetron (ZOFRAN) 8 MG tablet TAKE 1 TABLET 3 x  DAILY AS NEEDED FOR NAUSEA 60 tablet 2  . pantoprazole (PROTONIX) 40 MG tablet Take 1 tablet (40 mg total) by mouth daily. 90 tablet 1  . promethazine-dextromethorphan (PROMETHAZINE-DM) 6.25-15 MG/5ML syrup Take 5 mLs by mouth 4 (four) times daily as needed for cough. 240 mL 1  . ranitidine (ZANTAC) 300 MG tablet Take 1 to 2 tablets daily for heartburn & reflux to allow wean and transition from PPI / pantoprazole 180 tablet 99  . sertraline (ZOLOFT) 100 MG tablet TAKE 1 TABLET BY MOUTH ONCE DAILY FOR MOOD AND DEPRESSION 90 tablet 1  . DICYCLOMINE HCL PO Take 10 mg by mouth 2 (two) times daily.     Marland Kitchen doxycycline (VIBRAMYCIN) 100 MG capsule Take 1 capsule 2 x/day with food for 10 days (Patient not taking: Reported on 09/08/2017) 20 capsule 0  . HYDROcodone-acetaminophen (NORCO) 7.5-325 MG tablet Take  1-2 tablets by mouth every 6 (six) hours as needed for severe pain. (Patient not taking: Reported on 09/08/2017) 50 tablet 0  . methylphenidate (RITALIN) 20 MG tablet 1/2 to 1 tab 1 to 2 x / daily as needed for ADD or alertness 60 tablet 0   No current facility-administered medications on file prior to visit.     ROS: all negative except above.  Physical Exam: There were no vitals filed for this visit. BP 90/63   Pulse (!) 55   Temp (!) 97.4 F (36.3 C)   Resp (!) 97  General Appearance: Well nourished, in no apparent distress. Eyes: PERRLA, EOMs, conjunctiva no swelling or erythema Sinuses: No Frontal/maxillary tenderness ENT/Mouth: Ext aud canals clear, TMs without erythema, bulging. No erythema, swelling, or exudate on post pharynx.  Tonsils not swollen or erythematous. Hearing normal.  Neck: Supple, thyroid normal.  Respiratory: Respiratory effort normal, BS equal bilaterally without rales, wheezing or stridor. Faint scattered rhonchi to bilateral bases which clear with cough. Cardio: RRR with no MRGs. Brisk peripheral pulses without edema.  Abdomen: Soft, + BS.  Non tender, no guarding, rebound, hernias, masses. Lymphatics: Non tender without lymphadenopathy.  Musculoskeletal: In wheelchair; left LE in cast, toes blanch with normal sensation and movement  Skin: Warm, dry without rashes, lesions, ecchymosis.  Neuro: Cranial nerves intact. Normal muscle tone. Sensation intact.  Psych: Awake and oriented X 3, normal affect, Insight and Judgment appropriate.     Izora Ribas, NP 3:50 PM [facility]

## 2017-09-08 ENCOUNTER — Encounter: Payer: Self-pay | Admitting: Adult Health

## 2017-09-08 ENCOUNTER — Ambulatory Visit (INDEPENDENT_AMBULATORY_CARE_PROVIDER_SITE_OTHER): Payer: PPO | Admitting: Adult Health

## 2017-09-08 VITALS — BP 90/63 | HR 55 | Temp 97.4°F | Resp 97

## 2017-09-08 DIAGNOSIS — E559 Vitamin D deficiency, unspecified: Secondary | ICD-10-CM | POA: Diagnosis not present

## 2017-09-08 DIAGNOSIS — S82852S Displaced trimalleolar fracture of left lower leg, sequela: Secondary | ICD-10-CM

## 2017-09-08 DIAGNOSIS — R7309 Other abnormal glucose: Secondary | ICD-10-CM

## 2017-09-08 DIAGNOSIS — N183 Chronic kidney disease, stage 3 (moderate): Secondary | ICD-10-CM

## 2017-09-08 DIAGNOSIS — D696 Thrombocytopenia, unspecified: Secondary | ICD-10-CM | POA: Diagnosis not present

## 2017-09-08 DIAGNOSIS — E039 Hypothyroidism, unspecified: Secondary | ICD-10-CM

## 2017-09-08 DIAGNOSIS — I10 Essential (primary) hypertension: Secondary | ICD-10-CM | POA: Diagnosis not present

## 2017-09-08 DIAGNOSIS — N1831 Chronic kidney disease, stage 3a: Secondary | ICD-10-CM

## 2017-09-08 DIAGNOSIS — Z79899 Other long term (current) drug therapy: Secondary | ICD-10-CM | POA: Diagnosis not present

## 2017-09-08 MED FILL — PANTOPRAZOLE SOD DR 40 MG T: 40 | 90 days supply | Qty: 90 | Fill #1

## 2017-09-08 MED FILL — HYOSCYAMINE ER 0.375 MG TAB: 0.375 | 15 days supply | Qty: 60 | Fill #0

## 2017-09-08 MED FILL — ONDANSETRON HCL 8 MG TABLET: 8 | 20 days supply | Qty: 60 | Fill #1

## 2017-09-09 LAB — CBC WITH DIFFERENTIAL/PLATELET
BASOS ABS: 41 {cells}/uL (ref 0–200)
Basophils Relative: 0.5 %
Eosinophils Absolute: 413 cells/uL (ref 15–500)
Eosinophils Relative: 5.1 %
HEMATOCRIT: 37.9 % (ref 35.0–45.0)
HEMOGLOBIN: 12.9 g/dL (ref 11.7–15.5)
Lymphs Abs: 1677 cells/uL (ref 850–3900)
MCH: 30.9 pg (ref 27.0–33.0)
MCHC: 34 g/dL (ref 32.0–36.0)
MCV: 90.7 fL (ref 80.0–100.0)
MPV: 10.1 fL (ref 7.5–12.5)
Monocytes Relative: 7.3 %
NEUTROS PCT: 66.4 %
Neutro Abs: 5378 cells/uL (ref 1500–7800)
Platelets: 385 10*3/uL (ref 140–400)
RBC: 4.18 10*6/uL (ref 3.80–5.10)
RDW: 13.5 % (ref 11.0–15.0)
TOTAL LYMPHOCYTE: 20.7 %
WBC mixed population: 591 cells/uL (ref 200–950)
WBC: 8.1 10*3/uL (ref 3.8–10.8)

## 2017-09-09 LAB — COMPLETE METABOLIC PANEL WITH GFR
AG RATIO: 1.3 (calc) (ref 1.0–2.5)
ALBUMIN MSPROF: 3.5 g/dL — AB (ref 3.6–5.1)
ALKALINE PHOSPHATASE (APISO): 106 U/L (ref 33–130)
ALT: 14 U/L (ref 6–29)
AST: 15 U/L (ref 10–35)
BUN / CREAT RATIO: 21 (calc) (ref 6–22)
BUN: 31 mg/dL — ABNORMAL HIGH (ref 7–25)
CALCIUM: 9.4 mg/dL (ref 8.6–10.4)
CO2: 31 mmol/L (ref 20–32)
CREATININE: 1.46 mg/dL — AB (ref 0.50–0.99)
Chloride: 102 mmol/L (ref 98–110)
GFR, EST NON AFRICAN AMERICAN: 37 mL/min/{1.73_m2} — AB (ref >=60)
GFR, Est African American: 43 mL/min/{1.73_m2} — ABNORMAL LOW (ref >=60)
GLOBULIN: 2.8 g/dL (ref 1.9–3.7)
Glucose, Bld: 99 mg/dL (ref 65–99)
POTASSIUM: 5.5 mmol/L — AB (ref 3.5–5.3)
SODIUM: 139 mmol/L (ref 135–146)
Total Bilirubin: 0.4 mg/dL (ref 0.2–1.2)
Total Protein: 6.3 g/dL (ref 6.1–8.1)

## 2017-09-09 LAB — VITAMIN D 25 HYDROXY (VIT D DEFICIENCY, FRACTURES): Vit D, 25-Hydroxy: 64 ng/mL (ref 30–100)

## 2017-09-09 LAB — MAGNESIUM: Magnesium: 2.2 mg/dL (ref 1.5–2.5)

## 2017-09-09 LAB — TSH: TSH: 3.46 mIU/L (ref 0.40–4.50)

## 2017-09-15 DIAGNOSIS — S82852D Displaced trimalleolar fracture of left lower leg, subsequent encounter for closed fracture with routine healing: Secondary | ICD-10-CM | POA: Diagnosis not present

## 2017-09-15 DIAGNOSIS — Z4889 Encounter for other specified surgical aftercare: Secondary | ICD-10-CM | POA: Diagnosis not present

## 2017-09-17 ENCOUNTER — Encounter: Payer: Self-pay | Admitting: Gastroenterology

## 2017-09-25 DIAGNOSIS — S82892A Other fracture of left lower leg, initial encounter for closed fracture: Secondary | ICD-10-CM | POA: Diagnosis not present

## 2017-09-27 ENCOUNTER — Ambulatory Visit: Payer: Self-pay | Admitting: Internal Medicine

## 2017-10-11 ENCOUNTER — Encounter: Payer: Self-pay | Admitting: Internal Medicine

## 2017-10-13 DIAGNOSIS — Z4789 Encounter for other orthopedic aftercare: Secondary | ICD-10-CM | POA: Diagnosis not present

## 2017-10-13 DIAGNOSIS — S82852D Displaced trimalleolar fracture of left lower leg, subsequent encounter for closed fracture with routine healing: Secondary | ICD-10-CM | POA: Diagnosis not present

## 2017-10-14 ENCOUNTER — Other Ambulatory Visit: Payer: Self-pay | Admitting: Adult Health

## 2017-10-14 DIAGNOSIS — Z1231 Encounter for screening mammogram for malignant neoplasm of breast: Secondary | ICD-10-CM

## 2017-10-18 DIAGNOSIS — S82852D Displaced trimalleolar fracture of left lower leg, subsequent encounter for closed fracture with routine healing: Secondary | ICD-10-CM | POA: Diagnosis not present

## 2017-10-26 DIAGNOSIS — S82852D Displaced trimalleolar fracture of left lower leg, subsequent encounter for closed fracture with routine healing: Secondary | ICD-10-CM | POA: Diagnosis not present

## 2017-11-02 DIAGNOSIS — S82852D Displaced trimalleolar fracture of left lower leg, subsequent encounter for closed fracture with routine healing: Secondary | ICD-10-CM | POA: Diagnosis not present

## 2017-11-08 ENCOUNTER — Other Ambulatory Visit: Payer: Self-pay | Admitting: Internal Medicine

## 2017-11-08 MED FILL — SERTRALINE HCL 100 MG TAB: 100 | 90 days supply | Qty: 90 | Fill #0

## 2017-11-08 MED FILL — HYOSCYAMINE ER 0.375 MG TAB: 0.375 | 15 days supply | Qty: 60 | Fill #1

## 2017-11-09 DIAGNOSIS — S82852D Displaced trimalleolar fracture of left lower leg, subsequent encounter for closed fracture with routine healing: Secondary | ICD-10-CM | POA: Diagnosis not present

## 2017-11-16 DIAGNOSIS — S82852D Displaced trimalleolar fracture of left lower leg, subsequent encounter for closed fracture with routine healing: Secondary | ICD-10-CM | POA: Diagnosis not present

## 2017-11-23 ENCOUNTER — Other Ambulatory Visit: Payer: Self-pay | Admitting: Adult Health

## 2017-11-23 DIAGNOSIS — S82852D Displaced trimalleolar fracture of left lower leg, subsequent encounter for closed fracture with routine healing: Secondary | ICD-10-CM | POA: Diagnosis not present

## 2017-11-23 MED FILL — LEVOTHYROXINE 125 MCG TABLE: 125 | 90 days supply | Qty: 90 | Fill #0

## 2017-11-24 DIAGNOSIS — S82852D Displaced trimalleolar fracture of left lower leg, subsequent encounter for closed fracture with routine healing: Secondary | ICD-10-CM | POA: Diagnosis not present

## 2017-12-07 DIAGNOSIS — H2511 Age-related nuclear cataract, right eye: Secondary | ICD-10-CM | POA: Diagnosis not present

## 2017-12-07 DIAGNOSIS — H25811 Combined forms of age-related cataract, right eye: Secondary | ICD-10-CM | POA: Diagnosis not present

## 2017-12-08 MED FILL — ATENOLOL 100 MG TABLET: 100 | 90 days supply | Qty: 90 | Fill #1

## 2017-12-09 ENCOUNTER — Other Ambulatory Visit: Payer: Self-pay | Admitting: Adult Health

## 2017-12-09 MED FILL — buPROPion HCL ER (XL) 150 M: 150 | 90 days supply | Qty: 270 | Fill #0

## 2017-12-14 NOTE — Progress Notes (Signed)
This very nice 67 y.o. DWF presents for 3 month follow up with HTN, HLD, Pre-Diabetes and Vitamin D Deficiency. Patient has hx/o Depression & Add for which she's on Wellbutrin & Ritalin with good response. Patient also has GERD & IBS controlled on her meds.      Today patient also c/o intermittent Lt sided HA's and Lt ear pain. Denies associated visual sx's , nausea or vomitting. .      Patient is treated for HTN (2003) & BP has been controlled at home. Today's BP is at goal - 116/76. Patient has had no complaints of any cardiac type chest pain, palpitations, dyspnea / orthopnea / PND, dizziness, claudication, or dependent edema.     Hyperlipidemia is controlled with diet & meds. Patient denies myalgias or other med SE's. Last Lipids were  Lab Results  Component Value Date   CHOL 167 06/22/2017   HDL 55 06/22/2017   LDLCALC 93 06/22/2017   TRIG 95 06/22/2017   CHOLHDL 3.0 06/22/2017      Also, the patient has history of  PreDiabetes (A1c 5.8%/2011 and 6.0%/2012) and has had no symptoms of reactive hypoglycemia, diabetic polys, paresthesias or visual blurring.  Last A1c was at goal: Lab Results  Component Value Date   HGBA1C 5.5 08/26/2017      Patient has been on Thyroid replacement since the 1990's.      Further, the patient also has history of Vitamin D Deficiency and supplements vitamin D without any suspected side-effects. Last vitamin D was at goal: Lab Results  Component Value Date   VD25OH 64 09/08/2017   Current Outpatient Medications on File Prior to Visit  Medication Sig  . ALPRAZolam (XANAX) 0.5 MG tablet Take 1/2 to 1 tablet 3 x day if needed for anxiety or sleep  . Ascorbic Acid (VITAMIN C PO) Take 500 mg by mouth daily.   Marland Kitchen aspirin EC 81 MG tablet Take 81 mg by mouth daily.  Marland Kitchen atenolol (TENORMIN) 100 MG tablet TAKE 1 TABLET BY MOUTH DAILY  . buPROPion (WELLBUTRIN XL) 150 MG 24 hr tablet TAKE 3 TABLETS BY MOUTH EVERY MORNING FOR MOOD/DEPRESSION  . Cholecalciferol  (VITAMIN D-3) 5000 UNITS TABS Take 5,000 Units by mouth daily.   Marland Kitchen DICYCLOMINE HCL PO Take 10 mg by mouth 2 (two) times daily.   Marland Kitchen docusate sodium (COLACE) 100 MG capsule Take 100 mg by mouth 2 (two) times daily as needed for mild constipation.  . hyoscyamine (LEVSIN SL) 0.125 MG SL tablet PLACE 1 TABLET (0.125 MG TOTAL) UNDER THE TONGUE EVERY 4 (FOUR) HOURS AS NEEDED. (Patient taking differently: Place 0.125 mg under the tongue 2 (two) times daily. )  . levothyroxine (SYNTHROID, LEVOTHROID) 125 MCG tablet TAKE 1 TABLET BY MOUTH ONCE DAILY  . Magnesium 250 MG TABS Take 250 mg by mouth daily.  . meclizine (ANTIVERT) 25 MG tablet TAKE 1 TABLET BY MOUTH 3 TIMES DAILY AS NEEDED FOR NAUSEA  . Multiple Vitamins-Minerals (MULTIVITAMIN WITH MINERALS) tablet Take 1 tablet by mouth daily.  . ondansetron (ZOFRAN) 8 MG tablet TAKE 1 TABLET 3 x  DAILY AS NEEDED FOR NAUSEA  . pantoprazole (PROTONIX) 40 MG tablet Take 1 tablet (40 mg total) by mouth daily.  . ranitidine (ZANTAC) 300 MG tablet Take 1 to 2 tablets daily for heartburn & reflux to allow wean and transition from PPI / pantoprazole  . sertraline (ZOLOFT) 100 MG tablet TAKE 1 TABLET BY MOUTH ONCE DAILY FOR MOOD AND DEPRESSION  .  methylphenidate (RITALIN) 20 MG tablet 1/2 to 1 tab 1 to 2 x / daily as needed for ADD or alertness   No current facility-administered medications on file prior to visit.    Allergies  Allergen Reactions  . Ace Inhibitors Other (See Comments)    Acute renal failure (lisinopril)  . Cephalexin Anaphylaxis  . Macrobid [Nitrofurantoin] Other (See Comments)    Causes hyperthermia   . Codeine Nausea Only  . Latex Rash  . Sulfa Antibiotics Other (See Comments)    Do not take per nephrology   PMHx:   Past Medical History:  Diagnosis Date  . Chronic kidney disease (CKD), stage III (moderate) (HCC)   . Diverticulosis   . Emphysema lung ([place])   . Helicobacter pylori (H. pylori)   . History of IBS   . IBS (irritable bowel  syndrome)   . LBBB (left bundle branch block)   . Lower urinary tract infection 06/21/2017  . Migraines   . OCD (obsessive compulsive disorder)   . Prediabetes    pcp  . Renal disorder    AKI due to lisinpril, his. pyelonephritis,   . Sepsis ([place])   . Sleep apnea    no CPAP  . Vitamin D deficiency    Immunization History  Administered Date(s) Administered  . Influenza, High Dose Seasonal PF 03/22/2017  . Influenza-Unspecified 01/31/2016  . Pneumococcal Conjugate-13 03/22/2017  . Pneumococcal Polysaccharide-23 02/12/1999  . Td 04/27/2013  . Zoster 11/24/2005   Past Surgical History:  Procedure Laterality Date  . BREAST ENHANCEMENT SURGERY    . BUNIONECTOMY     Bil  . COLON SURGERY     colonoscopy  . HEMORRHOID SURGERY    . OOPHORECTOMY    . ORIF ANKLE FRACTURE Left 08/30/2017   Procedure: OPEN REDUCTION INTERNAL FIXATION (ORIF) ANKLE FRACTURE;  Surgeon: Rod Can, MD;  Location: [facility];  Service: Orthopedics;  Laterality: Left;  . TONSILLECTOMY     FHx:    Reviewed / unchanged  SHx:    Reviewed / unchanged   Systems Review:  Constitutional: Denies fever, chills, wt changes, headaches, insomnia, fatigue, night sweats, change in appetite. Eyes: Denies redness, blurred vision, diplopia, discharge, itchy, watery eyes.  ENT: Denies discharge, congestion, post nasal drip, epistaxis, sore throat, earache, hearing loss, dental pain, tinnitus, vertigo, sinus pain, snoring.  CV: Denies chest pain, palpitations, irregular heartbeat, syncope, dyspnea, diaphoresis, orthopnea, PND, claudication or edema. Respiratory: denies cough, dyspnea, DOE, pleurisy, hoarseness, laryngitis, wheezing.  Gastrointestinal: Denies dysphagia, odynophagia, heartburn, reflux, water brash, abdominal pain or cramps, nausea, vomiting, bloating, diarrhea, constipation, hematemesis, melena, hematochezia  or hemorrhoids. Genitourinary: Denies dysuria, frequency, urgency, nocturia, hesitancy, discharge,  hematuria or flank pain. Musculoskeletal: Denies arthralgias, myalgias, stiffness, jt. swelling, pain, limping or strain/sprain.  Skin: Denies pruritus, rash, hives, warts, acne, eczema or change in skin lesion(s). Neuro: No weakness, tremor, incoordination, spasms, paresthesia or pain. Psychiatric: Denies confusion, memory loss or sensory loss. Endo: Denies change in weight, skin or hair change.  Heme/Lymph: No excessive bleeding, bruising or enlarged lymph nodes.  Physical Exam  BP 116/76   Pulse 64   Temp (!) 97.3 F (36.3 C)   Resp 16   Ht 5\' 3"  (1.6 m)   Wt 156 lb 6.4 oz (70.9 kg)   BMI 27.71 kg/m   Appears  well nourished, well groomed  and in no distress.  Eyes: PERRLA, EOMs, conjunctiva no swelling or erythema. Sinuses: No frontal/maxillary tenderness ENT/Mouth: EAC's clear, TM's nl w/o erythema, bulging.  Exquisite Lt TM jt tenderness.  Nares clear w/o erythema, swelling, exudates. Oropharynx clear without erythema or exudates. Oral hygiene is good. Tongue normal, non obstructing. Hearing intact.  Neck: Supple. Thyroid not palpable. Car 2+/2+ without bruits, nodes or JVD. Chest: Respirations nl with BS clear & equal w/o rales, rhonchi, wheezing or stridor.  Cor: Heart sounds normal w/ regular rate and rhythm without sig. murmurs, gallops, clicks or rubs. Peripheral pulses normal and equal  without edema.  Abdomen: Soft & bowel sounds normal. Non-tender w/o guarding, rebound, hernias, masses or organomegaly.  Lymphatics: Unremarkable.  Musculoskeletal: Full ROM all peripheral extremities, joint stability, 5/5 strength and normal gait.  Skin: Warm, dry without exposed rashes, lesions or ecchymosis apparent.  Neuro: Cranial nerves intact, reflexes equal bilaterally. Sensory-motor testing grossly intact. Tendon reflexes grossly intact.  Pysch: Alert & oriented x 3.  Insight and judgement nl & appropriate. No ideations.  Assessment and Plan:  1. Essential hypertension  -  Continue medication, monitor blood pressure at home.  - Continue DASH diet.  Reminder to go to the ER if any CP,  SOB, nausea, dizziness, severe HA, changes vision/speech.  - CBC with Differential/Platelet - COMPLETE METABOLIC PANEL WITH GFR - Magnesium - TSH  2. Hyperlipidemia, mixed  - Continue diet/meds, exercise,& lifestyle modifications.  - Continue monitor periodic cholesterol/liver & renal functions   - Lipid panel - TSH  3. Abnormal glucose  - Continue diet, exercise, lifestyle modifications.  - Monitor appropriate labs.  - Hemoglobin A1c - Insulin, random  4. Vitamin D deficiency  - Continue supplementation.   - VITAMIN D 25 Hydroxyl  5. Prediabetes  - Hemoglobin A1c - Insulin, random  6. CKD Stage 3 (GFR 55 ml/min)  - COMPLETE METABOLIC PANEL WITH GFR  7. Hypothyroidism  - TSH  8. Gastroesophageal reflux disease  - CBC with Differential/Platelet  9. Irritable bowel syndrome with both constipation and diarrhea   10. TM jt Arthralgia  - Rx Prednisone to try on a sparingly basis and also heating pad at low setting.  11. Medication management  - CBC with Differential/Platelet - COMPLETE METABOLIC PANEL WITH GFR - Magnesium - Lipid panel - TSH - Hemoglobin A1c - Insulin, random - VITAMIN D 25 Hydroxyl       Discussed  regular exercise, BP monitoring, weight control to achieve/maintain BMI less than 25 and discussed med and SE's. Recommended labs to assess and monitor clinical status with further disposition pending results of labs. Over 30 minutes of exam, counseling, chart review was performed.

## 2017-12-15 ENCOUNTER — Ambulatory Visit (INDEPENDENT_AMBULATORY_CARE_PROVIDER_SITE_OTHER): Payer: PPO | Admitting: Internal Medicine

## 2017-12-15 ENCOUNTER — Encounter: Payer: Self-pay | Admitting: Internal Medicine

## 2017-12-15 VITALS — BP 116/76 | HR 64 | Temp 97.3°F | Resp 16 | Ht 63.0 in | Wt 156.4 lb

## 2017-12-15 DIAGNOSIS — R7303 Prediabetes: Secondary | ICD-10-CM

## 2017-12-15 DIAGNOSIS — R7309 Other abnormal glucose: Secondary | ICD-10-CM | POA: Diagnosis not present

## 2017-12-15 DIAGNOSIS — E039 Hypothyroidism, unspecified: Secondary | ICD-10-CM | POA: Diagnosis not present

## 2017-12-15 DIAGNOSIS — N183 Chronic kidney disease, stage 3 (moderate): Secondary | ICD-10-CM

## 2017-12-15 DIAGNOSIS — I10 Essential (primary) hypertension: Secondary | ICD-10-CM | POA: Diagnosis not present

## 2017-12-15 DIAGNOSIS — E782 Mixed hyperlipidemia: Secondary | ICD-10-CM

## 2017-12-15 DIAGNOSIS — K219 Gastro-esophageal reflux disease without esophagitis: Secondary | ICD-10-CM

## 2017-12-15 DIAGNOSIS — N1831 Chronic kidney disease, stage 3a: Secondary | ICD-10-CM

## 2017-12-15 DIAGNOSIS — Z79899 Other long term (current) drug therapy: Secondary | ICD-10-CM | POA: Diagnosis not present

## 2017-12-15 DIAGNOSIS — K582 Mixed irritable bowel syndrome: Secondary | ICD-10-CM

## 2017-12-15 DIAGNOSIS — M26622 Arthralgia of left temporomandibular joint: Secondary | ICD-10-CM

## 2017-12-15 DIAGNOSIS — E559 Vitamin D deficiency, unspecified: Secondary | ICD-10-CM | POA: Diagnosis not present

## 2017-12-15 MED ORDER — PREDNISONE 10 MG PO TABS: ORAL_TABLET | ORAL | 1 refills | Status: DC

## 2017-12-15 MED FILL — predniSONE 10 MG TABS: 10 | 10 days supply | Qty: 30 | Fill #0

## 2017-12-15 NOTE — Patient Instructions (Signed)

## 2017-12-16 LAB — COMPLETE METABOLIC PANEL WITH GFR
AG Ratio: 1.3 (calc) (ref 1.0–2.5)
ALKALINE PHOSPHATASE (APISO): 95 U/L (ref 33–130)
ALT: 16 U/L (ref 6–29)
AST: 18 U/L (ref 10–35)
Albumin: 3.9 g/dL (ref 3.6–5.1)
BILIRUBIN TOTAL: 0.5 mg/dL (ref 0.2–1.2)
BUN/Creatinine Ratio: 22 (calc) (ref 6–22)
BUN: 30 mg/dL — ABNORMAL HIGH (ref 7–25)
CHLORIDE: 102 mmol/L (ref 98–110)
CO2: 28 mmol/L (ref 20–32)
CREATININE: 1.34 mg/dL — AB (ref 0.50–0.99)
Calcium: 9.8 mg/dL (ref 8.6–10.4)
GFR, Est African American: 48 mL/min/{1.73_m2} — ABNORMAL LOW (ref >=60)
GFR, Est Non African American: 41 mL/min/{1.73_m2} — ABNORMAL LOW (ref >=60)
GLUCOSE: 94 mg/dL (ref 65–99)
Globulin: 2.9 g/dL (calc) (ref 1.9–3.7)
Potassium: 5.1 mmol/L (ref 3.5–5.3)
SODIUM: 139 mmol/L (ref 135–146)
Total Protein: 6.8 g/dL (ref 6.1–8.1)

## 2017-12-16 LAB — CBC WITH DIFFERENTIAL/PLATELET
BASOS ABS: 42 {cells}/uL (ref 0–200)
Basophils Relative: 0.9 %
EOS PCT: 6.4 %
Eosinophils Absolute: 301 cells/uL (ref 15–500)
HCT: 42.6 % (ref 35.0–45.0)
Hemoglobin: 14 g/dL (ref 11.7–15.5)
Lymphs Abs: 1542 cells/uL (ref 850–3900)
MCH: 30.2 pg (ref 27.0–33.0)
MCHC: 32.9 g/dL (ref 32.0–36.0)
MCV: 91.8 fL (ref 80.0–100.0)
MONOS PCT: 9.4 %
MPV: 10.5 fL (ref 7.5–12.5)
NEUTROS PCT: 50.5 %
Neutro Abs: 2374 cells/uL (ref 1500–7800)
Platelets: 298 10*3/uL (ref 140–400)
RBC: 4.64 10*6/uL (ref 3.80–5.10)
RDW: 13 % (ref 11.0–15.0)
Total Lymphocyte: 32.8 %
WBC mixed population: 442 cells/uL (ref 200–950)
WBC: 4.7 10*3/uL (ref 3.8–10.8)

## 2017-12-16 LAB — HEMOGLOBIN A1C
EAG (MMOL/L): 6.2 (calc)
Hgb A1c MFr Bld: 5.5 % of total Hgb (ref <5.7)
MEAN PLASMA GLUCOSE: 111 (calc)

## 2017-12-16 LAB — VITAMIN D 25 HYDROXY (VIT D DEFICIENCY, FRACTURES): VIT D 25 HYDROXY: 68 ng/mL (ref 30–100)

## 2017-12-16 LAB — INSULIN, RANDOM: INSULIN: 8 u[IU]/mL (ref 2.0–19.6)

## 2017-12-16 LAB — LIPID PANEL
Cholesterol: 212 mg/dL — ABNORMAL HIGH (ref <200)
HDL: 63 mg/dL (ref >50)
LDL Cholesterol (Calc): 129 mg/dL (calc) — ABNORMAL HIGH
Non-HDL Cholesterol (Calc): 149 mg/dL (calc) — ABNORMAL HIGH (ref <130)
Total CHOL/HDL Ratio: 3.4 (calc) (ref <5.0)
Triglycerides: 97 mg/dL (ref <150)

## 2017-12-16 LAB — MAGNESIUM: MAGNESIUM: 2 mg/dL (ref 1.5–2.5)

## 2017-12-16 LAB — TSH: TSH: 0.86 m[IU]/L (ref 0.40–4.50)

## 2017-12-29 ENCOUNTER — Ambulatory Visit: Payer: PPO

## 2017-12-29 ENCOUNTER — Ambulatory Visit
Admission: RE | Admit: 2017-12-29 | Discharge: 2017-12-29 | Disposition: A | Discharge status: Home / Self Care | Payer: PPO | Source: Ambulatory Visit | Attending: Adult Health | Admitting: Adult Health

## 2017-12-29 DIAGNOSIS — Z1231 Encounter for screening mammogram for malignant neoplasm of breast: Secondary | ICD-10-CM

## 2017-12-31 MED FILL — PREDNISOLONE AC 1% EYE DROP: 1 | 38 days supply | Qty: 10 | Fill #0

## 2018-01-20 ENCOUNTER — Other Ambulatory Visit (INDEPENDENT_AMBULATORY_CARE_PROVIDER_SITE_OTHER): Payer: PPO

## 2018-01-20 DIAGNOSIS — Z1211 Encounter for screening for malignant neoplasm of colon: Secondary | ICD-10-CM

## 2018-01-20 LAB — POC HEMOCCULT BLD/STL (HOME/3-CARD/SCREEN)
Card #3 Fecal Occult Blood, POC: NEGATIVE
FECAL OCCULT BLD: NEGATIVE
FECAL OCCULT BLD: NEGATIVE

## 2018-01-31 DIAGNOSIS — S82852D Displaced trimalleolar fracture of left lower leg, subsequent encounter for closed fracture with routine healing: Secondary | ICD-10-CM | POA: Diagnosis not present

## 2018-02-07 DIAGNOSIS — R7303 Prediabetes: Secondary | ICD-10-CM | POA: Diagnosis not present

## 2018-02-07 DIAGNOSIS — N183 Chronic kidney disease, stage 3 (moderate): Secondary | ICD-10-CM | POA: Diagnosis not present

## 2018-02-07 DIAGNOSIS — N179 Acute kidney failure, unspecified: Secondary | ICD-10-CM | POA: Diagnosis not present

## 2018-02-07 DIAGNOSIS — R809 Proteinuria, unspecified: Secondary | ICD-10-CM | POA: Diagnosis not present

## 2018-02-07 DIAGNOSIS — N390 Urinary tract infection, site not specified: Secondary | ICD-10-CM | POA: Diagnosis not present

## 2018-02-07 DIAGNOSIS — J439 Emphysema, unspecified: Secondary | ICD-10-CM | POA: Diagnosis not present

## 2018-02-07 DIAGNOSIS — I129 Hypertensive chronic kidney disease with stage 1 through stage 4 chronic kidney disease, or unspecified chronic kidney disease: Secondary | ICD-10-CM | POA: Diagnosis not present

## 2018-02-07 DIAGNOSIS — E785 Hyperlipidemia, unspecified: Secondary | ICD-10-CM | POA: Diagnosis not present

## 2018-02-07 DIAGNOSIS — Z87440 Personal history of urinary (tract) infections: Secondary | ICD-10-CM | POA: Diagnosis not present

## 2018-02-07 MED FILL — SERTRALINE HCL 100 MG TAB: 100 | 90 days supply | Qty: 90 | Fill #1

## 2018-02-16 DIAGNOSIS — N179 Acute kidney failure, unspecified: Secondary | ICD-10-CM | POA: Diagnosis not present

## 2018-02-16 DIAGNOSIS — E875 Hyperkalemia: Secondary | ICD-10-CM | POA: Diagnosis not present

## 2018-02-22 ENCOUNTER — Other Ambulatory Visit: Payer: Self-pay | Admitting: Internal Medicine

## 2018-02-22 MED FILL — LEVOTHYROXINE 125 MCG TABLE: 125 | 90 days supply | Qty: 90 | Fill #1

## 2018-02-22 MED FILL — HYOSCYAMINE ER 0.375 MG TAB: 0.375 | 15 days supply | Qty: 60 | Fill #2

## 2018-03-09 MED FILL — ATENOLOL 100 MG TABLET: 100 | 90 days supply | Qty: 90 | Fill #0

## 2018-03-14 DIAGNOSIS — N3941 Urge incontinence: Secondary | ICD-10-CM | POA: Diagnosis not present

## 2018-03-14 DIAGNOSIS — N949 Unspecified condition associated with female genital organs and menstrual cycle: Secondary | ICD-10-CM | POA: Diagnosis not present

## 2018-03-14 DIAGNOSIS — R32 Unspecified urinary incontinence: Secondary | ICD-10-CM | POA: Diagnosis not present

## 2018-03-14 DIAGNOSIS — N393 Stress incontinence (female) (male): Secondary | ICD-10-CM | POA: Diagnosis not present

## 2018-03-14 DIAGNOSIS — R151 Fecal smearing: Secondary | ICD-10-CM | POA: Diagnosis not present

## 2018-03-14 DIAGNOSIS — R399 Unspecified symptoms and signs involving the genitourinary system: Secondary | ICD-10-CM | POA: Diagnosis not present

## 2018-03-14 MED FILL — PREMARIN VAGINAL CREAM-APPL: 0.625 | 30 days supply | Qty: 30 | Fill #0

## 2018-03-22 ENCOUNTER — Ambulatory Visit (INDEPENDENT_AMBULATORY_CARE_PROVIDER_SITE_OTHER): Payer: PPO | Admitting: Adult Health Nurse Practitioner

## 2018-03-22 ENCOUNTER — Encounter: Payer: Self-pay | Admitting: Internal Medicine

## 2018-03-22 ENCOUNTER — Encounter: Payer: Self-pay | Admitting: Adult Health Nurse Practitioner

## 2018-03-22 VITALS — BP 126/80 | HR 67 | Temp 97.7°F | Ht 63.0 in | Wt 162.0 lb

## 2018-03-22 DIAGNOSIS — Z1389 Encounter for screening for other disorder: Secondary | ICD-10-CM

## 2018-03-22 DIAGNOSIS — Z0000 Encounter for general adult medical examination without abnormal findings: Secondary | ICD-10-CM | POA: Diagnosis not present

## 2018-03-22 DIAGNOSIS — E559 Vitamin D deficiency, unspecified: Secondary | ICD-10-CM

## 2018-03-22 DIAGNOSIS — I10 Essential (primary) hypertension: Secondary | ICD-10-CM

## 2018-03-22 DIAGNOSIS — K219 Gastro-esophageal reflux disease without esophagitis: Secondary | ICD-10-CM

## 2018-03-22 DIAGNOSIS — E039 Hypothyroidism, unspecified: Secondary | ICD-10-CM

## 2018-03-22 DIAGNOSIS — E782 Mixed hyperlipidemia: Secondary | ICD-10-CM | POA: Diagnosis not present

## 2018-03-22 DIAGNOSIS — N183 Chronic kidney disease, stage 3 unspecified: Secondary | ICD-10-CM

## 2018-03-22 DIAGNOSIS — H9203 Otalgia, bilateral: Secondary | ICD-10-CM

## 2018-03-22 DIAGNOSIS — R32 Unspecified urinary incontinence: Secondary | ICD-10-CM

## 2018-03-22 DIAGNOSIS — F900 Attention-deficit hyperactivity disorder, predominantly inattentive type: Secondary | ICD-10-CM

## 2018-03-22 DIAGNOSIS — R7309 Other abnormal glucose: Secondary | ICD-10-CM | POA: Diagnosis not present

## 2018-03-22 DIAGNOSIS — F429 Obsessive-compulsive disorder, unspecified: Secondary | ICD-10-CM

## 2018-03-22 DIAGNOSIS — D696 Thrombocytopenia, unspecified: Secondary | ICD-10-CM

## 2018-03-22 DIAGNOSIS — R053 Chronic cough: Secondary | ICD-10-CM

## 2018-03-22 DIAGNOSIS — Z79899 Other long term (current) drug therapy: Secondary | ICD-10-CM

## 2018-03-22 DIAGNOSIS — R05 Cough: Secondary | ICD-10-CM

## 2018-03-22 DIAGNOSIS — K589 Irritable bowel syndrome without diarrhea: Secondary | ICD-10-CM

## 2018-03-22 DIAGNOSIS — S0300XS Dislocation of jaw, unspecified side, sequela: Secondary | ICD-10-CM

## 2018-03-22 DIAGNOSIS — Z6828 Body mass index (BMI) 28.0-28.9, adult: Secondary | ICD-10-CM

## 2018-03-22 MED ORDER — CYCLOBENZAPRINE HCL 10 MG PO TABS: 10.0000 mg | ORAL_TABLET | Freq: Every evening | ORAL | 1 refills | Status: DC | PRN

## 2018-03-22 MED FILL — CYCLOBENZAPRINE HCL 10 MG T: 10 | 30 days supply | Qty: 30 | Fill #0

## 2018-03-22 NOTE — Progress Notes (Signed)
Complete Physical  Assessment and Plan:  [name] was seen today for annual exam, other and cough.  Diagnoses and all orders for this visit:  Essential hypertension Continue medications with benefit Monitor blood pressure at home PRN -     EKG 12-Lead -     COMPLETE METABOLIC PANEL WITH GFR -     Microalbumin / creatinine urine ratio -     Amb ref to Medical Nutrition Therapy-MNT  CKD (chronic kidney disease) stage 3, GFR 30-59 ml/min (HCC) -     Urinalysis w microscopic + reflex cultur -     CBC with Differential/Platelet -     Amb ref to Medical Nutrition Therapy-MNT Follow with Nephrology for this Increase fluids, avoid NSAIDS, monitor sugars, will monitor  Irritable bowel syndrome, unspecified type -     Amb ref to Medical Nutrition Therapy-MNT Continue probiotic and current regiment  Hypothyroidism, unspecified type Taking levothyroxine 125mg  daily  -     TSH continue medications the same, reminded to take on an empty stomach 30-35mins before food.   Thrombocytopenia (HCC) -     CBC with Differential/Platelet  Attention deficit hyperactivity disorder (ADHD), predominantly inattentive type Doing well continue current regiment  Obsessive-compulsive disorder, unspecified type Doing well, continue current regiment  Mixed hyperlipidemia -     Lipid panel  Dislocation of temporomandibular joint, sequela -     cyclobenzaprine (FLEXERIL) 10 MG tablet; Take 1 tablet (10 mg total) by mouth at bedtime as needed for muscle spasms.  Incontinence in female Following with urology for this Using Premarin cream QHS  GERD Continue OTC Zantac 300mg  Discussed recall with patient Reports it is working and she does not want to change at this time  Chronic Cough Discussed possible causes of this GERD vs allergies? Will increase Zantac to BID If not effective try Zyrtec or Allegra  Vitamin D deficiency -     VITAMIN D 25 Hydroxy  Continue supplements  Other abnormal  glucose -     Hemoglobin A1c -     Insulin, random  Medication management  -     Urinalysis w microscopic + reflex cultur -     CBC with Differential/Platelet -     COMPLETE METABOLIC PANEL WITH GFR -     Lipid panel -     TSH -     Hemoglobin A1c -     Insulin, random -     VITAMIN D 25 Hydroxy (Vit-D Deficiency, Fractures) -     Magnesium -     Vitamin B12 -     Microalbumin / creatinine urine ratio -     Amb ref to Medical Nutrition Therapy-MNT  Screening for blood or protein in urine -     Urinalysis w microscopic + reflex cultur -     Microalbumin / creatinine urine ratio  Body mass index (BMI) of 28.0 to 28.9 in adult -     Amb ref to Medical Nutrition Therapy-MNT  Acute Oltagia, bilateral: Bilateral ear lavage completed Bilateral canals clear Tolerated well Discussed ear hygiene and care    Discussed med's effects and SE's. Screening labs and tests as requested with regular follow-up as recommended. Over 40 minutes of exam, counseling, chart review, and complex, high level critical decision making was performed this visit.   HPI  67 y.o. female  presents for a complete physical and follow up for has Hypothyroidism; Depression, controlled; Diverticulosis; ADHD (attention deficit hyperactivity disorder); OCD (obsessive compulsive disorder); Other abnormal glucose;  Migraines; Vitamin D deficiency; LBBB (left bundle branch block); HTN (hypertension); Mixed hyperlipidemia; Medication management; CKD Stage 3 (GFR 55 ml/min); IBS ; Thrombocytopenia ([place]); and Closed displaced trimalleolar fracture of left ankle on their problem list..  Reports she has urge incontinence.  She is following with urology for this.  Reports she is using premerine cream for vaginal atrophy. And doing pelvic floor therapy for this.  December 30th is her next follow up.    She reports that she has a non productive cough for over the past year.  It is non-productive and intermittent.  She is unable  to associate this with eating or drinking or temperature changes  She is taking Ranitiadine 300mg  nightly.  She has recently stopped taking PPI.    Reports that her jaw has been bothering her for over 6 months.  Reports that her jaw is stiff and it is painful to chew her food.  Her jaw clicks when she tried to open fully.  She has tried wearing teeth whitening trays at night but she has a hard time tolerating something her her mouth while she sleeps.  She has tried OTC mouth guards to help with teeth grinding.  She has not discussed this with her dentist.  The patient reports she has had multiple changes and demands in regards to her diet.  Her Nephrologist wants her to have low potasium diet, she has been trying low fat to assist with weight loss.  She is trying high fiber to help with her IBS.  She would like a referral for nutritionist related to changing dietary requirements with chronic conditions.  She is just not sure what she should be eating.     Her blood pressure has been controlled at home, today their BP is BP: 126/80 She does workout. She denies chest pain, shortness of breath, dizziness.   She is not on cholesterol medication and denies myalgias. Her cholesterol is not at goal. The cholesterol last visit was:   Lab Results  Component Value Date   CHOL 212 (H) 12/15/2017   HDL 63 12/15/2017   LDLCALC 129 (H) 12/15/2017   TRIG 97 12/15/2017   CHOLHDL 3.4 12/15/2017    She has been working on diet and exercise forshe is on bASA, she is not on ACE/ARB and denies hyperglycemia, hypoglycemia , polydipsia and polyuria. Last A1C in the office was:  Lab Results  Component Value Date   HGBA1C 5.5 12/15/2017    Last GFR: Lab Results  Component Value Date   GFRNONAA 41 (L) 12/15/2017   Lab Results  Component Value Date   GFRAA 48 (L) 12/15/2017    Patient is on Vitamin D supplement.   Lab Results  Component Value Date   VD25OH 68 12/15/2017      Current Medications:   Current Outpatient Medications on File Prior to Visit  Medication Sig Dispense Refill  . ALPRAZolam (XANAX) 0.5 MG tablet Take 1/2 to 1 tablet 3 x day if needed for anxiety or sleep 90 tablet 2  . Ascorbic Acid (VITAMIN C PO) Take 500 mg by mouth daily.     Marland Kitchen aspirin EC 81 MG tablet Take 81 mg by mouth daily.    Marland Kitchen atenolol (TENORMIN) 100 MG tablet TAKE 1 TABLET BY MOUTH ONCE DAILY 90 tablet 1  . buPROPion (WELLBUTRIN XL) 150 MG 24 hr tablet TAKE 3 TABLETS BY MOUTH EVERY MORNING FOR MOOD/DEPRESSION 270 tablet 1  . Cholecalciferol (VITAMIN D-3) 5000 UNITS TABS Take 5,000 Units  by mouth daily.     Marland Kitchen DICYCLOMINE HCL PO Take 10 mg by mouth 2 (two) times daily.     Marland Kitchen docusate sodium (COLACE) 100 MG capsule Take 100 mg by mouth 2 (two) times daily as needed for mild constipation.    . hyoscyamine (LEVSIN SL) 0.125 MG SL tablet PLACE 1 TABLET (0.125 MG TOTAL) UNDER THE TONGUE EVERY 4 (FOUR) HOURS AS NEEDED. (Patient taking differently: Place 0.125 mg under the tongue 2 (two) times daily. ) 100 tablet 0  . levothyroxine (SYNTHROID, LEVOTHROID) 125 MCG tablet TAKE 1 TABLET BY MOUTH ONCE DAILY 90 tablet 1  . Magnesium 250 MG TABS Take 250 mg by mouth daily.    . meclizine (ANTIVERT) 25 MG tablet TAKE 1 TABLET BY MOUTH 3 TIMES DAILY AS NEEDED FOR NAUSEA 90 tablet 1  . Multiple Vitamins-Minerals (MULTIVITAMIN WITH MINERALS) tablet Take 1 tablet by mouth daily.    . ondansetron (ZOFRAN) 8 MG tablet TAKE 1 TABLET 3 x  DAILY AS NEEDED FOR NAUSEA 60 tablet 2  . ranitidine (ZANTAC) 300 MG tablet Take 1 to 2 tablets daily for heartburn & reflux to allow wean and transition from PPI / pantoprazole 180 tablet 99  . sertraline (ZOLOFT) 100 MG tablet TAKE 1 TABLET BY MOUTH ONCE DAILY FOR MOOD AND DEPRESSION 90 tablet 1  . methylphenidate (RITALIN) 20 MG tablet 1/2 to 1 tab 1 to 2 x / daily as needed for ADD or alertness 60 tablet 0   No current facility-administered medications on file prior to visit.    Allergies:   Allergies  Allergen Reactions  . Ace Inhibitors Other (See Comments)    Acute renal failure (lisinopril)  . Cephalexin Anaphylaxis  . Macrobid [Nitrofurantoin] Other (See Comments)    Causes hyperthermia   . Codeine Nausea Only  . Latex Rash  . Sulfa Antibiotics Other (See Comments)    Do not take per nephrology   Medical History:  She has Hypothyroidism; Depression, controlled; Diverticulosis; ADHD (attention deficit hyperactivity disorder); OCD (obsessive compulsive disorder); Other abnormal glucose; Migraines; Vitamin D deficiency; LBBB (left bundle branch block); HTN (hypertension); Mixed hyperlipidemia; Medication management; CKD Stage 3 (GFR 55 ml/min); IBS ; Thrombocytopenia ([place]); and Closed displaced trimalleolar fracture of left ankle on their problem list. Health Maintenance:   Immunization History  Administered Date(s) Administered  . Influenza, High Dose Seasonal PF 03/22/2017  . Influenza-Unspecified 01/31/2016  . Pneumococcal Conjugate-13 03/22/2017  . Pneumococcal Polysaccharide-23 02/12/1999  . Td 04/27/2013  . Zoster 11/24/2005    Tetanus: 2015 Pneumovax:2000 Prevnar 13:  2018 Flu vaccine: 2019 [facility] Zostavax:2007  Shingrix DUE? Pap: Total hysterectomy MGM: 2012 DUE DEXA: 2010, DUE 2020 Colonoscopy: 2014 EGD: 1999  Last Dental Exam:  Q28months, 2019 Last Eye Exam:  2019,   Patient Care Team: Unk [name], MD as PCP - General (Internal Medicine) Rockey Situ (Urology) Donato Heinz, MD as Consulting Physician (Nephrology) Melissa Noon, [place] as Referring Physician (Optometry)  Surgical History:  She has a past surgical history that includes Tonsillectomy; Oophorectomy; Bunionectomy; Hemorrhoid surgery; Breast enhancement surgery; Colon surgery; and ORIF ankle fracture (Left, 08/30/2017). Family History:  Herfamily history includes Colon polyps in her mother. Social History:  She reports that she quit smoking about 15 years ago. Her  smoking use included cigarettes. She has a 40.00 pack-year smoking history. She has never used smokeless tobacco. She reports that she does not drink alcohol or use drugs.  Review of Systems: Review of Systems  Constitutional: Negative for chills,  diaphoresis, fever, malaise/fatigue and weight loss.  HENT: Negative for congestion, ear discharge, ear pain, hearing loss, nosebleeds, sinus pain, sore throat and tinnitus.   Eyes: Negative for blurred vision, double vision, photophobia, pain, discharge and redness.  Respiratory: Positive for cough. Negative for hemoptysis, sputum production, shortness of breath, wheezing and stridor.   Cardiovascular: Negative for chest pain, palpitations, orthopnea, claudication, leg swelling and PND.  Gastrointestinal: Positive for constipation and diarrhea. Negative for abdominal pain, blood in stool, heartburn, melena, nausea and vomiting.  Genitourinary: Positive for dysuria, frequency and urgency. Negative for flank pain and hematuria.  Musculoskeletal: Negative for back pain, falls, joint pain, myalgias and neck pain.  Skin: Negative for itching and rash.  Neurological: Negative for dizziness, tingling, tremors, sensory change, speech change, focal weakness, seizures, loss of consciousness, weakness and headaches.  Endo/Heme/Allergies: Negative for environmental allergies and polydipsia. Does not bruise/bleed easily.  Psychiatric/Behavioral: Negative for depression, hallucinations, memory loss, substance abuse and suicidal ideas. The patient is nervous/anxious. The patient does not have insomnia.        Reports related to ADD/OCD.    Physical Exam: Estimated body mass index is 28.7 kg/m as calculated from the following:   Height as of this encounter: 5\' 3"  (1.6 m).   Weight as of this encounter: 162 lb (73.5 kg). BP 126/80   Pulse 67   Temp 97.7 F (36.5 C)   Ht 5\' 3"  (1.6 m)   Wt 162 lb (73.5 kg)   SpO2 95%   BMI 28.70 kg/m  General Appearance:  Well nourished, in no apparent distress.  Eyes: PERRLA, EOMs, conjunctiva no swelling or erythema, normal fundi and vessels.  Sinuses: No Frontal/maxillary tenderness  ENT/Mouth: Bilateral canals cerumen impaction. , Good dentition. No erythema, swelling, or exudate on post pharynx. Tonsils not swollen or erythematous. Hearing normal. Bilateral jaw tightness, crepitus noted.  Tender to palpation bilaterally.  Left mild edema noted. Neck: Supple, thyroid normal. No bruits  Respiratory: Respiratory effort normal, BS equal bilaterally without rales, rhonchi, wheezing or stridor.  Cardio: RRR without murmurs, rubs or gallops. Brisk peripheral pulses without edema.  Chest: symmetric, with normal excursions and percussion.  Breasts: Symmetric, without lumps, nipple discharge, retractions.  Abdomen: Soft, nontender, no guarding, rebound, hernias, masses, or organomegaly.  Lymphatics: Non tender without lymphadenopathy.  Genitourinary:  Musculoskeletal: Full ROM all peripheral extremities,5/5 strength, and normal gait.  Skin: Warm, dry without rashes, lesions, ecchymosis. Neuro: Cranial nerves intact, reflexes equal bilaterally. Normal muscle tone, no cerebellar symptoms. Sensation intact.  Psych: Awake and oriented X 3, normal affect, Insight and Judgment appropriate.   EKG: WNL no ST changes. AORTA SCAN: WNL   [name] 1:09 PM [facility]

## 2018-03-22 NOTE — Patient Instructions (Signed)
Today we flushed both of your ears  Use a dropper or use a cap to put peroxide in your ear.  Make sure it is room temp or warm.  Cold will cause dizziness and nausea.  Leave in ear for 3-14mn.  Do this every 2 weeks. THEN use couple drops of olive oil,mineral oil effected ear for 3 days after peroxide cleanse.   You can take a shower or use a syringe or baby bulb with warm water to wash out the ear wax.  Do not use Qtips.  We will do a referral for a nutritionist for you related to the dietary requirements of your chronic conditions. Continue probiotic daily  TMJ, use warm most compress to jaw.  Will send in muscle relaxer to use as needed at night.  May make you sleepy, do not drive on this medication.  Your cough could be related to reflux.  Increase your zantac to twice a day to see if this helps. You can also try and allergy pill to see if the cough is related to allergies.  Take Zyrtec or Allegra, you can get this at any pharmacy.    We Do NOT Approve of  [facility], [employer] Our Patients  To Do Home Visits & We Do NOT Approve of [employer] > > > > > > > > > > > > > > > > > > > > > > > > > > > > > > > > > > > > > > >  Preventive Care for Adults  A healthy lifestyle and preventive care can promote health and wellness. Preventive health guidelines for women include the following key practices.  A routine yearly physical is a good way to check with your health care provider about your health and preventive screening. It is a chance to share any concerns and updates on your health and to receive a thorough exam.  Visit your dentist for a routine exam and preventive care every 6 months. Brush your teeth twice a day and floss once a day. Good oral hygiene prevents tooth decay and gum disease.  The frequency of eye exams is based on your age, health, family medical history, use of contact lenses, and other factors. Follow your health care provider's  recommendations for frequency of eye exams.  Eat a healthy diet. Foods like vegetables, fruits, whole grains, low-fat dairy products, and lean protein foods contain the nutrients you need without too many calories. Decrease your intake of foods high in solid fats, added sugars, and salt. Eat the right amount of calories for you. Get information about a proper diet from your health care provider, if necessary.  Regular physical exercise is one of the most important things you can do for your health. Most adults should get at least 150 minutes of moderate-intensity exercise (any activity that increases your heart rate and causes you to sweat) each week. In addition, most adults need muscle-strengthening exercises on 2 or more days a week.  Maintain a healthy weight. The body mass index (BMI) is a screening tool to identify possible weight problems. It provides an estimate of body fat based on height and weight. Your health care provider can find your BMI and can help you achieve or maintain a healthy weight. For adults 20 years and older:  A BMI below 18.5 is considered underweight.  A BMI of 18.5 to 24.9 is normal.  A BMI of 25 to 29.9 is considered overweight.  A  BMI of 30 and above is considered obese.  Maintain normal blood lipids and cholesterol levels by exercising and minimizing your intake of saturated fat. Eat a balanced diet with plenty of fruit and vegetables. If your lipid or cholesterol levels are high, you are over 50, or you are at high risk for heart disease, you may need your cholesterol levels checked more frequently. Ongoing high lipid and cholesterol levels should be treated with medicines if diet and exercise are not working.  If you smoke, find out from your health care provider how to quit. If you do not use tobacco, do not start.  Lung cancer screening is recommended for adults aged 55-80 years who are at high risk for developing lung cancer because of a history of smoking.  A yearly low-dose CT scan of the lungs is recommended for people who have at least a 30-pack-year history of smoking and are a current smoker or have quit within the past 15 years. A pack year of smoking is smoking an average of 1 pack of cigarettes a day for 1 year (for example: 1 pack a day for 30 years or 2 packs a day for 15 years). Yearly screening should continue until the smoker has stopped smoking for at least 15 years. Yearly screening should be stopped for people who develop a health problem that would prevent them from having lung cancer treatment.  Avoid use of street drugs. Do not share needles with anyone. Ask for help if you need support or instructions about stopping the use of drugs.  High blood pressure causes heart disease and increases the risk of stroke.  Ongoing high blood pressure should be treated with medicines if weight loss and exercise do not work.  If you are 105-38 years old, ask your health care provider if you should take aspirin to prevent strokes.  Diabetes screening involves taking a blood sample to check your fasting blood sugar level. This should be done once every 3 years, after age 52, if you are within normal weight and without risk factors for diabetes. Testing should be considered at a younger age or be carried out more frequently if you are overweight and have at least 1 risk factor for diabetes.  Breast cancer screening is essential preventive care for women. You should practice "breast self-awareness." This means understanding the normal appearance and feel of your breasts and may include breast self-examination. Any changes detected, no matter how small, should be reported to a health care provider. Women in their 72s and 30s should have a clinical breast exam (CBE) by a health care provider as part of a regular health exam every 1 to 3 years. After age 63, women should have a CBE every year. Starting at age 88, women should consider having a mammogram (breast  X-ray test) every year. Women who have a family history of breast cancer should talk to their health care provider about genetic screening. Women at a high risk of breast cancer should talk to their health care providers about having an MRI and a mammogram every year.  Breast cancer gene (BRCA)-related cancer risk assessment is recommended for women who have family members with BRCA-related cancers. BRCA-related cancers include breast, ovarian, tubal, and peritoneal cancers. Having family members with these cancers may be associated with an increased risk for harmful changes (mutations) in the breast cancer genes BRCA1 and BRCA2. Results of the assessment will determine the need for genetic counseling and BRCA1 and BRCA2 testing.  Routine pelvic exams  to screen for cancer are no longer recommended for nonpregnant women who are considered low risk for cancer of the pelvic organs (ovaries, uterus, and vagina) and who do not have symptoms. Ask your health care provider if a screening pelvic exam is right for you.  If you have had past treatment for cervical cancer or a condition that could lead to cancer, you need Pap tests and screening for cancer for at least 20 years after your treatment. If Pap tests have been discontinued, your risk factors (such as having a new sexual partner) need to be reassessed to determine if screening should be resumed. Some women have medical problems that increase the chance of getting cervical cancer. In these cases, your health care provider may recommend more frequent screening and Pap tests.    Colorectal cancer can be detected and often prevented. Most routine colorectal cancer screening begins at the age of 43 years and continues through age 7 years. However, your health care provider may recommend screening at an earlier age if you have risk factors for colon cancer. On a yearly basis, your health care provider may provide home test kits to check for hidden blood in the  stool. Use of a small camera at the end of a tube, to directly examine the colon (sigmoidoscopy or colonoscopy), can detect the earliest forms of colorectal cancer. Talk to your health care provider about this at age 31, when routine screening begins.  Direct exam of the colon should be repeated every 5-10 years through age 57 years, unless early forms of pre-cancerous polyps or small growths are found.  Osteoporosis is a disease in which the bones lose minerals and strength with aging. This can result in serious bone fractures or breaks. The risk of osteoporosis can be identified using a bone density scan. Women ages 56 years and over and women at risk for fractures or osteoporosis should discuss screening with their health care providers. Ask your health care provider whether you should take a calcium supplement or vitamin D to reduce the rate of osteoporosis.  Menopause can be associated with physical symptoms and risks. Hormone replacement therapy is available to decrease symptoms and risks. You should talk to your health care provider about whether hormone replacement therapy is right for you.  Use sunscreen. Apply sunscreen liberally and repeatedly throughout the day. You should seek shade when your shadow is shorter than you. Protect yourself by wearing long sleeves, pants, a wide-brimmed hat, and sunglasses year round, whenever you are outdoors.  Once a month, do a whole body skin exam, using a mirror to look at the skin on your back. Tell your health care provider of new moles, moles that have irregular borders, moles that are larger than a pencil eraser, or moles that have changed in shape or color.  Stay current with required vaccines (immunizations).  Influenza vaccine. All adults should be immunized every year.  Tetanus, diphtheria, and acellular pertussis (Td, Tdap) vaccine. Pregnant women should receive 1 dose of Tdap vaccine during each pregnancy. The dose should be obtained regardless  of the length of time since the last dose. Immunization is preferred during the 27th-36th week of gestation. An adult who has not previously received Tdap or who does not know her vaccine status should receive 1 dose of Tdap. This initial dose should be followed by tetanus and diphtheria toxoids (Td) booster doses every 10 years. Adults with an unknown or incomplete history of completing a 3-dose immunization series with Td-containing vaccines  should begin or complete a primary immunization series including a Tdap dose. Adults should receive a Td booster every 10 years.    Zoster vaccine. One dose is recommended for adults aged 21 years or older unless certain conditions are present.    Pneumococcal 13-valent conjugate (PCV13) vaccine. When indicated, a person who is uncertain of her immunization history and has no record of immunization should receive the PCV13 vaccine. An adult aged 9 years or older who has certain medical conditions and has not been previously immunized should receive 1 dose of PCV13 vaccine. This PCV13 should be followed with a dose of pneumococcal polysaccharide (PPSV23) vaccine. The PPSV23 vaccine dose should be obtained at least 1 or more year(s) after the dose of PCV13 vaccine. An adult aged 16 years or older who has certain medical conditions and previously received 1 or more doses of PPSV23 vaccine should receive 1 dose of PCV13. The PCV13 vaccine dose should be obtained 1 or more years after the last PPSV23 vaccine dose.    Pneumococcal polysaccharide (PPSV23) vaccine. When PCV13 is also indicated, PCV13 should be obtained first. All adults aged 11 years and older should be immunized. An adult younger than age 70 years who has certain medical conditions should be immunized. Any person who resides in a nursing home or long-term care facility should be immunized. An adult smoker should be immunized. People with an immunocompromised condition and certain other conditions should  receive both PCV13 and PPSV23 vaccines. People with human immunodeficiency virus (HIV) infection should be immunized as soon as possible after diagnosis. Immunization during chemotherapy or radiation therapy should be avoided. Routine use of PPSV23 vaccine is not recommended for American Indians, [place] Natives, or people younger than 65 years unless there are medical conditions that require PPSV23 vaccine. When indicated, people who have unknown immunization and have no record of immunization should receive PPSV23 vaccine. One-time revaccination 5 years after the first dose of PPSV23 is recommended for people aged 19-64 years who have chronic kidney failure, nephrotic syndrome, asplenia, or immunocompromised conditions. People who received 1-2 doses of PPSV23 before age 45 years should receive another dose of PPSV23 vaccine at age 16 years or later if at least 5 years have passed since the previous dose. Doses of PPSV23 are not needed for people immunized with PPSV23 at or after age 54 years.   Preventive Services / Frequency  Ages 39 years and over  Blood pressure check.  Lipid and cholesterol check.  Lung cancer screening. / Every year if you are aged 15-80 years and have a 30-pack-year history of smoking and currently smoke or have quit within the past 15 years. Yearly screening is stopped once you have quit smoking for at least 15 years or develop a health problem that would prevent you from having lung cancer treatment.  Clinical breast exam.** / Every year after age 74 years.   BRCA-related cancer risk assessment.** / For women who have family members with a BRCA-related cancer (breast, ovarian, tubal, or peritoneal cancers).  Mammogram.** / Every year beginning at age 10 years and continuing for as long as you are in good health. Consult with your health care provider.  Pap test.** / Every 3 years starting at age 40 years through age 60 or 41 years with 3 consecutive normal Pap tests.  Testing can be stopped between 65 and 70 years with 3 consecutive normal Pap tests and no abnormal Pap or HPV tests in the past 10 years.  Fecal occult  blood test (FOBT) of stool. / Every year beginning at age 82 years and continuing until age 87 years. You may not need to do this test if you get a colonoscopy every 10 years.  Flexible sigmoidoscopy or colonoscopy.** / Every 5 years for a flexible sigmoidoscopy or every 10 years for a colonoscopy beginning at age 46 years and continuing until age 23 years.  Hepatitis C blood test.** / For all people born from [birth year] through 1964 and any individual with known risks for hepatitis C.  Osteoporosis screening.** / A one-time screening for women ages 6 years and over and women at risk for fractures or osteoporosis.  Skin self-exam. / Monthly.  Influenza vaccine. / Every year.  Tetanus, diphtheria, and acellular pertussis (Tdap/Td) vaccine.** / 1 dose of Td every 10 years.  Zoster vaccine.** / 1 dose for adults aged 2 years or older.  Pneumococcal 13-valent conjugate (PCV13) vaccine.** / Consult your health care provider.  Pneumococcal polysaccharide (PPSV23) vaccine.** / 1 dose for all adults aged 63 years and older. Screening for abdominal aortic aneurysm (AAA)  by ultrasound is recommended for people who have history of high blood pressure or who are current or former smokers. ++++++++++++++++++++ Recommend Adult Low Dose Aspirin or  coated  Aspirin 81 mg daily  To reduce risk of Colon Cancer 20 %,  Skin Cancer 26 % ,  Melanoma 46%  and  Pancreatic cancer 60% ++++++++++++++++++++ Vitamin D goal  is between 70-100.  Please make sure that you are taking your Vitamin D as directed.  It is very important as a natural anti-inflammatory  helping hair, skin, and nails, as well as reducing stroke and heart attack risk.  It helps your bones and helps with mood. It also decreases numerous cancer risks so please take it as directed.  Low Vit D  is associated with a 200-300% higher risk for CANCER  and 200-300% higher risk for HEART   ATTACK  &  STROKE.   .....................................Marland Kitchen It is also associated with higher death rate at younger ages,  autoimmune diseases like Rheumatoid arthritis, Lupus, Multiple Sclerosis.    Also many other serious conditions, like depression, Alzheimer's Dementia, infertility, muscle aches, fatigue, fibromyalgia - just to name a few. ++++++++++++++++++ Recommend the book "The END of DIETING" by Dr Excell Seltzer  & the book "The END of DIABETES " by Dr Excell Seltzer At [email or web address] - get book & Audio CD's    Being diabetic has a  300% increased risk for heart attack, stroke, cancer, and alzheimer- type vascular dementia. It is very important that you work harder with diet by avoiding all foods that are white. Avoid white rice (brown & wild rice is OK), white potatoes (sweetpotatoes in moderation is OK), White bread or wheat bread or anything made out of white flour like bagels, donuts, rolls, buns, biscuits, cakes, pastries, cookies, pizza crust, and pasta (made from white flour & egg whites) - vegetarian pasta or spinach or wheat pasta is OK. Multigrain breads like Arnold's or Pepperidge [employer], or multigrain sandwich thins or flatbreads.  Diet, exercise and weight loss can reverse and cure diabetes in the early stages.  Diet, exercise and weight loss is very important in the control and prevention of complications of diabetes which affects every system in your body, ie. Brain - dementia/stroke, eyes - glaucoma/blindness, heart - heart attack/heart failure, kidneys - dialysis, stomach - gastric paralysis, intestines - malabsorption, nerves - severe painful neuritis, circulation - gangrene & loss of a  leg(s), and finally cancer and Alzheimers.    I recommend avoid fried & greasy foods,  sweets/candy, white rice (brown or wild rice or Quinoa is OK), white potatoes (sweet potatoes are OK) - anything made from  white flour - bagels, doughnuts, rolls, buns, biscuits,white and wheat breads, pizza crust and traditional pasta made of white flour & egg white(vegetarian pasta or spinach or wheat pasta is OK).  Multi-grain bread is OK - like multi-grain flat bread or sandwich thins. Avoid alcohol in excess. Exercise is also important.    Eat all the vegetables you want - avoid meat, especially red meat and dairy - especially cheese.  Cheese is the most concentrated form of trans-fats which is the worst thing to clog up our arteries. Veggie cheese is OK which can be found in the fresh produce section at [employer] or [employer] or [employer]  +++++++++++++++++++ DASH Eating Plan  DASH stands for "Dietary Approaches to Stop Hypertension."   The DASH eating plan is a healthy eating plan that has been shown to reduce high blood pressure (hypertension). Additional health benefits may include reducing the risk of type 2 diabetes mellitus, heart disease, and stroke. The DASH eating plan may also help with weight loss. WHAT DO I NEED TO KNOW ABOUT THE DASH EATING PLAN? For the DASH eating plan, you will follow these general guidelines:  Choose foods with a percent daily value for sodium of less than 5% (as listed on the food label).  Use salt-free seasonings or herbs instead of table salt or sea salt.  Check with your health care provider or pharmacist before using salt substitutes.  Eat lower-sodium products, often labeled as "lower sodium" or "no salt added."  Eat fresh foods.  Eat more vegetables, fruits, and low-fat dairy products.  Choose whole grains. Look for the word "whole" as the first word in the ingredient list.  Choose fish   Limit sweets, desserts, sugars, and sugary drinks.  Choose heart-healthy fats.  Eat veggie cheese   Eat more home-cooked food and less restaurant, buffet, and fast food.  Limit fried foods.  Cook foods using methods other than frying.  Limit canned vegetables.  If you do use them, rinse them well to decrease the sodium.  When eating at a restaurant, ask that your food be prepared with less salt, or no salt if possible.                      WHAT FOODS CAN I EAT? Read Dr Fara Olden Fuhrman's books on The End of Dieting & The End of Diabetes  Grains Whole grain or whole wheat bread. Brown rice. Whole grain or whole wheat pasta. Quinoa, bulgur, and whole grain cereals. Low-sodium cereals. Corn or whole wheat flour tortillas. Whole grain cornbread. Whole grain crackers. Low-sodium crackers.  Vegetables Fresh or frozen vegetables (raw, steamed, roasted, or grilled). Low-sodium or reduced-sodium tomato and vegetable juices. Low-sodium or reduced-sodium tomato sauce and paste. Low-sodium or reduced-sodium canned vegetables.   Fruits All fresh, canned (in natural juice), or frozen fruits.  Protein Products  All fish and seafood.  Dried beans, peas, or lentils. Unsalted nuts and seeds. Unsalted canned beans.  Dairy Low-fat dairy products, such as skim or 1% milk, 2% or reduced-fat cheeses, low-fat ricotta or cottage cheese, or plain low-fat yogurt. Low-sodium or reduced-sodium cheeses.  Fats and Oils Tub margarines without trans fats. Light or reduced-fat mayonnaise and salad dressings (reduced sodium). Avocado. Safflower, olive, or canola oils. Natural  peanut or almond butter.  Other Unsalted popcorn and pretzels. The items listed above may not be a complete list of recommended foods or beverages. Contact your dietitian for more options.  +++++++++++++++  WHAT FOODS ARE NOT RECOMMENDED? Grains/ White flour or wheat flour White bread. White pasta. White rice. Refined cornbread. Bagels and croissants. Crackers that contain trans fat.  Vegetables  Creamed or fried vegetables. Vegetables in a . Regular canned vegetables. Regular canned tomato sauce and paste. Regular tomato and vegetable juices.  Fruits Dried fruits. Canned fruit in light or heavy  syrup. Fruit juice.  Meat and Other Protein Products Meat in general - RED meat & White meat.  Fatty cuts of meat. Ribs, chicken wings, all processed meats as bacon, sausage, bologna, salami, fatback, hot dogs, bratwurst and packaged luncheon meats.  Dairy Whole or 2% milk, cream, half-and-half, and cream cheese. Whole-fat or sweetened yogurt. Full-fat cheeses or blue cheese. Non-dairy creamers and whipped toppings. Processed cheese, cheese spreads, or cheese curds.  Condiments Onion and garlic salt, seasoned salt, table salt, and sea salt. Canned and packaged gravies. Worcestershire sauce. Tartar sauce. Barbecue sauce. Teriyaki sauce. Soy sauce, including reduced sodium. Steak sauce. Fish sauce. Oyster sauce. Cocktail sauce. Horseradish. Ketchup and mustard. Meat flavorings and tenderizers. Bouillon cubes. Hot sauce. Tabasco sauce. Marinades. Taco seasonings. Relishes.  Fats and Oils Butter, stick margarine, lard, shortening and bacon fat. Coconut, palm kernel, or palm oils. Regular salad dressings.  Pickles and olives. Salted popcorn and pretzels.  The items listed above may not be a complete list of foods and beverages to avoid.

## 2018-03-23 ENCOUNTER — Other Ambulatory Visit: Payer: Self-pay | Admitting: Adult Health Nurse Practitioner

## 2018-03-23 DIAGNOSIS — N390 Urinary tract infection, site not specified: Secondary | ICD-10-CM

## 2018-03-23 MED ORDER — CIPROFLOXACIN HCL 250 MG PO TABS: 250.0000 mg | ORAL_TABLET | Freq: Two times a day (BID) | ORAL | 0 refills | Status: DC

## 2018-03-23 MED FILL — CIPROFLOXACIN HCL 250 MG TA: 250 | 7 days supply | Qty: 14 | Fill #0

## 2018-03-23 MED FILL — buPROPion HCL ER (XL) 150 M: 150 | 90 days supply | Qty: 270 | Fill #1

## 2018-03-24 LAB — COMPLETE METABOLIC PANEL WITH GFR
AST: 17 U/L (ref 10–35)
Albumin: 3.8 g/dL (ref 3.6–5.1)
Alkaline phosphatase (APISO): 79 U/L (ref 33–130)
BUN: 35 mg/dL — ABNORMAL HIGH (ref 7–25)
CO2: 30 mmol/L (ref 20–32)
Creat: 1.4 mg/dL — ABNORMAL HIGH (ref 0.50–0.99)
Glucose, Bld: 92 mg/dL (ref 65–99)

## 2018-03-24 LAB — CBC WITH DIFFERENTIAL/PLATELET
Basophils Absolute: 50 cells/uL (ref 0–200)
Basophils Relative: 1 %
Eosinophils Absolute: 435 {cells}/uL (ref 15–500)
Eosinophils Relative: 8.7 %
HCT: 40.3 % (ref 35.0–45.0)
Hemoglobin: 13.5 g/dL (ref 11.7–15.5)
Lymphs Abs: 1460 {cells}/uL (ref 850–3900)
MCH: 31.3 pg (ref 27.0–33.0)
MCHC: 33.5 g/dL (ref 32.0–36.0)
MCV: 93.3 fL (ref 80.0–100.0)
MPV: 10.4 fL (ref 7.5–12.5)
Monocytes Relative: 7.4 %
Neutro Abs: 2685 {cells}/uL (ref 1500–7800)
Neutrophils Relative %: 53.7 %
Platelets: 299 10*3/uL (ref 140–400)
RBC: 4.32 10*6/uL (ref 3.80–5.10)
RDW: 13.9 % (ref 11.0–15.0)
Total Lymphocyte: 29.2 %
WBC mixed population: 370 {cells}/uL (ref 200–950)
WBC: 5 10*3/uL (ref 3.8–10.8)

## 2018-03-24 LAB — COMPLETE METABOLIC PANEL WITHOUT GFR
AG Ratio: 1.3 (calc) (ref 1.0–2.5)
ALT: 11 U/L (ref 6–29)
BUN/Creatinine Ratio: 25 (calc) — ABNORMAL HIGH (ref 6–22)
Calcium: 9.6 mg/dL (ref 8.6–10.4)
Chloride: 101 mmol/L (ref 98–110)
GFR, Est African American: 45 mL/min/1.73m2 — ABNORMAL LOW (ref >=60)
GFR, Est Non African American: 39 mL/min/1.73m2 — ABNORMAL LOW (ref >=60)
Globulin: 2.9 g/dL (ref 1.9–3.7)
Potassium: 4.8 mmol/L (ref 3.5–5.3)
Sodium: 137 mmol/L (ref 135–146)
Total Bilirubin: 0.3 mg/dL (ref 0.2–1.2)
Total Protein: 6.7 g/dL (ref 6.1–8.1)

## 2018-03-24 LAB — URINE CULTURE
MICRO NUMBER:: 91430392
SPECIMEN QUALITY:: ADEQUATE

## 2018-03-24 LAB — URINALYSIS W MICROSCOPIC + REFLEX CULTURE
Bacteria, UA: NONE SEEN /HPF
Bilirubin Urine: NEGATIVE
Glucose, UA: NEGATIVE
Hgb urine dipstick: NEGATIVE
Hyaline Cast: NONE SEEN /LPF
Ketones, ur: NEGATIVE
Nitrites, Initial: NEGATIVE
Protein, ur: NEGATIVE
Specific Gravity, Urine: 1.018 (ref 1.001–1.03)
Squamous Epithelial / HPF: NONE SEEN /HPF (ref <=5)
WBC, UA: >=60 /HPF — AB (ref 0–5)
pH: 5.5 (ref 5.0–8.0)

## 2018-03-24 LAB — INSULIN, RANDOM: Insulin: 4.3 u[IU]/mL (ref 2.0–19.6)

## 2018-03-24 LAB — MICROALBUMIN / CREATININE URINE RATIO
Creatinine, Urine: 88 mg/dL (ref 20–275)
Microalb Creat Ratio: 6 ug/mg{creat} (ref <30)
Microalb, Ur: 0.5 mg/dL

## 2018-03-24 LAB — MAGNESIUM: Magnesium: 2.1 mg/dL (ref 1.5–2.5)

## 2018-03-24 LAB — LIPID PANEL
Cholesterol: 178 mg/dL (ref <200)
HDL: 61 mg/dL (ref >50)
LDL Cholesterol (Calc): 99 mg/dL (calc)
Non-HDL Cholesterol (Calc): 117 mg/dL (ref <130)
Total CHOL/HDL Ratio: 2.9 (calc) (ref <5.0)
Triglycerides: 89 mg/dL (ref <150)

## 2018-03-24 LAB — TSH: TSH: 1.3 m[IU]/L (ref 0.40–4.50)

## 2018-03-24 LAB — VITAMIN B12: Vitamin B-12: >2000 pg/mL — ABNORMAL HIGH (ref 200–1100)

## 2018-03-24 LAB — HEMOGLOBIN A1C
Hgb A1c MFr Bld: 5.4 %{Hb} (ref <5.7)
Mean Plasma Glucose: 108 (calc)
eAG (mmol/L): 6 (calc)

## 2018-03-24 LAB — CULTURE INDICATED

## 2018-03-24 LAB — VITAMIN D 25 HYDROXY (VIT D DEFICIENCY, FRACTURES): Vit D, 25-Hydroxy: 80 ng/mL (ref 30–100)

## 2018-04-18 ENCOUNTER — Encounter: Payer: Self-pay | Admitting: Dietician

## 2018-04-18 ENCOUNTER — Encounter: Payer: PPO | Attending: Internal Medicine | Admitting: Dietician

## 2018-04-18 DIAGNOSIS — Z713 Dietary counseling and surveillance: Secondary | ICD-10-CM | POA: Insufficient documentation

## 2018-04-18 DIAGNOSIS — I10 Essential (primary) hypertension: Secondary | ICD-10-CM

## 2018-04-18 DIAGNOSIS — N1831 Chronic kidney disease, stage 3a: Secondary | ICD-10-CM

## 2018-04-18 DIAGNOSIS — N183 Chronic kidney disease, stage 3 (moderate): Secondary | ICD-10-CM | POA: Insufficient documentation

## 2018-04-18 DIAGNOSIS — K589 Irritable bowel syndrome without diarrhea: Secondary | ICD-10-CM

## 2018-04-18 DIAGNOSIS — I129 Hypertensive chronic kidney disease with stage 1 through stage 4 chronic kidney disease, or unspecified chronic kidney disease: Secondary | ICD-10-CM | POA: Insufficient documentation

## 2018-04-18 NOTE — Patient Instructions (Addendum)
[email or web address] National kidney foundation- [email or web address].org  Use low sodium chicken broth or bullion (read label to avoid potassium), herbs, spices rather than salt.  Consider cooking chicken breast up and freezing for later use rather than canned chicken.

## 2018-04-18 NOTE — Progress Notes (Signed)
  Medical Nutrition Therapy:  Appt start time: 1100 end time:  1215  .   Assessment:  Primary concerns today: Patient is here today alone.  Referral for HTN, IBS, BMI, and CKD stage 3.  Other history includes ADHD, OCD, thrombocytopenia, hypothyroidism, HLD, GERD, vitamin D deficiency, jaw pain.  She is to follow a low potassium diet per her nephrologist, a low fat diet for weight loss and increased fiber but is struggling with knowing what to eat.  IBS is often spasms with fluctuating constipation and diarrhea.  She does not tolerate cruciferous vegetables but cannot identify other foods.   Labs include:  BUN/Creatinine 35/1.4, Potassium 4.8, GFR 39, vitamin D 80, vitamin B-12 >20000 and A1C 5.4% 03/22/18.   Weight 164 lbs and generally stable.  Lives alone.  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  Preferred Learning Style:   No preference indicated   Learning Readiness:   Ready  Change in progress  MEDICATIONS: see list to include:  Probiotic/prebiotic,    DIETARY INTAKE:  Avoided foods include Cruciferous vegetables.   24-hr recall:  B ( AM): [employer] toast with butter, jelly or LF cream cheese, skim milk OR cottage cheese and pear or apple OR 1-2 eggs, toast, butter OR cereal (rice or corn chex), 1 cup skim milk Snk ( AM): plain chex cereal or cottage cheese and fruit or toast  L ( PM): same as breakfast and occasional nuts OR leftovers Snk ( PM): similar to am D (10 PM): fish, canned or frozen vegetables, rice or pasta or beans OR chicken, stuffing, cranberry sauce Snk ( PM): none Beverages: coffee with sweet and low and creamer occasionally, water, chocolate almond milk, skim milk, grape juice  Usual physical activity: working 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   Nutritional Diagnosis:  NB-1.1 Food and nutrition-related knowledge deficit As related to renal diet with  other restrictions.  As evidenced by patient report.    Intervention:  Nutrition education related to nutrition and CKD with focus on lower potassium and sodium.  Discussed phosphorous and that this should be decreased in her diet if lab values are elevated.  Discussed basic meal planning.  Discussed basic IBS symptoms and intolerances as well.  Discussed saturated and unsaturated fat.    -[employer] -National kidney foundation- kidney.org -Use low sodium chicken broth or bullion (read label to avoid potassium), herbs, spices rather than salt. -Consider cooking chicken breast up and freezing for later use rather than canned chicken.  Teaching Method Utilized:  Visual Auditory  Handouts given during visit include:  CKD stages 1-4 nutrition therapy from AND  Renal diet pyramid  IBS nutrition therapy from AND  Barriers to learning/adherence to lifestyle change: none  Demonstrated degree of understanding via:  Teach Back   Monitoring/Evaluation:  Dietary intake, exercise, label reading, and body weight prn.

## 2018-04-25 DIAGNOSIS — H2513 Age-related nuclear cataract, bilateral: Secondary | ICD-10-CM | POA: Diagnosis not present

## 2018-05-02 ENCOUNTER — Encounter: Payer: Self-pay | Admitting: Internal Medicine

## 2018-05-04 ENCOUNTER — Other Ambulatory Visit: Payer: Self-pay | Admitting: Adult Health

## 2018-05-04 ENCOUNTER — Other Ambulatory Visit: Payer: Self-pay | Admitting: Internal Medicine

## 2018-05-04 DIAGNOSIS — K219 Gastro-esophageal reflux disease without esophagitis: Secondary | ICD-10-CM

## 2018-05-04 MED FILL — SERTRALINE HCL 100 MG TAB: 100 | 90 days supply | Qty: 90 | Fill #0

## 2018-05-06 MED FILL — OSCIMIN SR 0.375 MG TABLET: 0.375 | 15 days supply | Qty: 60 | Fill #0

## 2018-05-09 DIAGNOSIS — N3946 Mixed incontinence: Secondary | ICD-10-CM | POA: Diagnosis not present

## 2018-05-18 DIAGNOSIS — R399 Unspecified symptoms and signs involving the genitourinary system: Secondary | ICD-10-CM | POA: Diagnosis not present

## 2018-05-23 ENCOUNTER — Other Ambulatory Visit: Payer: Self-pay | Admitting: Internal Medicine

## 2018-05-23 MED FILL — CIPROFLOXACIN HCL 500 MG TA: 500 | 3 days supply | Qty: 6 | Fill #0

## 2018-05-23 MED FILL — LEVOTHYROXINE 125 MCG TABLE: 125 | 90 days supply | Qty: 90 | Fill #0

## 2018-05-30 DIAGNOSIS — N3941 Urge incontinence: Secondary | ICD-10-CM | POA: Diagnosis not present

## 2018-05-30 DIAGNOSIS — N393 Stress incontinence (female) (male): Secondary | ICD-10-CM | POA: Diagnosis not present

## 2018-05-30 DIAGNOSIS — R32 Unspecified urinary incontinence: Secondary | ICD-10-CM | POA: Diagnosis not present

## 2018-05-30 DIAGNOSIS — R399 Unspecified symptoms and signs involving the genitourinary system: Secondary | ICD-10-CM | POA: Diagnosis not present

## 2018-05-30 DIAGNOSIS — Z09 Encounter for follow-up examination after completed treatment for conditions other than malignant neoplasm: Secondary | ICD-10-CM | POA: Diagnosis not present

## 2018-05-30 DIAGNOSIS — N949 Unspecified condition associated with female genital organs and menstrual cycle: Secondary | ICD-10-CM | POA: Diagnosis not present

## 2018-05-30 MED FILL — MYRBETRIQ ER 25 MG TABLET: 25 | 30 days supply | Qty: 30 | Fill #0

## 2018-06-05 MED FILL — ATENOLOL 100 MG TABLET: 100 | 90 days supply | Qty: 90 | Fill #1

## 2018-06-09 ENCOUNTER — Other Ambulatory Visit: Payer: Self-pay | Admitting: Physician Assistant

## 2018-06-09 MED FILL — buPROPion HCL ER (XL) 150 M: 150 | 90 days supply | Qty: 270 | Fill #0

## 2018-06-22 ENCOUNTER — Ambulatory Visit: Payer: Self-pay | Admitting: Adult Health

## 2018-06-28 ENCOUNTER — Other Ambulatory Visit: Payer: Self-pay | Admitting: Internal Medicine

## 2018-06-28 DIAGNOSIS — K635 Polyp of colon: Secondary | ICD-10-CM | POA: Insufficient documentation

## 2018-06-28 DIAGNOSIS — Z860100 Personal history of colon polyps, unspecified: Secondary | ICD-10-CM | POA: Insufficient documentation

## 2018-06-28 DIAGNOSIS — K219 Gastro-esophageal reflux disease without esophagitis: Secondary | ICD-10-CM

## 2018-06-28 DIAGNOSIS — K589 Irritable bowel syndrome without diarrhea: Secondary | ICD-10-CM

## 2018-06-28 MED FILL — OSCIMIN SL 0.125 MG TABLET: 0.125 | 16 days supply | Qty: 100 | Fill #0

## 2018-06-28 MED FILL — ONDANSETRON HCL 8 MG TABLET: 8 | 20 days supply | Qty: 60 | Fill #0

## 2018-06-28 MED FILL — MYRBETRIQ ER 25 MG TABLET: 25 | 30 days supply | Qty: 30 | Fill #1

## 2018-06-28 NOTE — Progress Notes (Signed)
MEDICARE ANNUAL WELLNESS VISIT AND FOLLOW UP  Assessment:   Diagnoses and all orders for this visit:  Encounter for Medicare annual wellness exam  Essential hypertension At goal; continue medication Monitor blood pressure at home; call if consistently over 130/80 Continue DASH diet.   Reminder to go to the ER if any CP, SOB, nausea, dizziness, severe HA, changes vision/speech, left arm numbness and tingling and jaw pain.  LBBB (left bundle branch block) Continue to monitor- cardiology as indicated  Migraine without status migrainosus, not intractable, unspecified migraine type Reports improved recently;  No significant issues or concerns  Irritable bowel syndrome, unspecified type Symptoms stable/improved - continue monitoring diet/lifestyle modification  Hypothyroidism, unspecified type continue medications the same pending lab results reminded to take on an empty stomach 30-11mins before food.  -     TSH  CKD Stage 3 (GFR 55 ml/min) Increase fluids, avoid NSAIDS, monitor sugars, will monitor Continue follow up with nephrology -     CMP/GFR  Attention deficit hyperactivity disorder (ADHD), predominantly inattentive type Continue medications: ritalin PRN Helps with focus, no AE's. The patient was counseled on the addictive nature of the medication and was encouraged to take drug holidays when not needed. Advised to limit to 5 days a week or less, [name] is agreeable and optimistic that this is reasonable.   Depression, controlled Continue medications; discussed minimizing use of benzos to avoid tolerance Lifestyle discussed: diet/exerise, sleep hygiene, stress management, hydration  Diverticulosis of intestine without bleeding, unspecified intestinal tract location Due for colonoscopy this July - will refer back to GI at next visit  Medication management -     CBC with Differential/Platelet -     CMP/GFR  Mixed hyperlipidemia Fair control by lifestyle modification  only Continue low cholesterol diet and exercise.  -     Lipid panel  Obsessive-compulsive disorder, unspecified type Symptoms improved on SSRI  Other abnormal glucose Recent A1Cs at goal Discussed disease and risks Discussed diet/exercise, weight management  -     CMP/GFR  Thrombocytopenia (HCC) -     CBC with Differential/Platelet  Vitamin D deficiency At goal at recent check; continue to recommend supplementation for goal of 70-100 Defer vitamin D level  Estrogen deficiency -     DG Bone Density; Future   Memory changes Word-finding difficulty x 1-2 years, worse in last 6 months Check labs - CBC, CMP, TSH, ferritin, RPR (has recent normal B12, neg HIV in 2015) Will monitor, if progressing further discussed neuro referral which [name] declines at this time   Over 40 minutes of exam, counseling, chart review and critical decision making was performed Future Appointments  Date Time Provider [place]  04/19/2019  2:00 PM Unk [name], MD [facility] None     Plan:   During the course of the visit the patient was educated and counseled about appropriate screening and preventive services including:    Pneumococcal vaccine   Prevnar 13  Influenza vaccine  Td vaccine  Screening electrocardiogram  Bone densitometry screening  Colorectal cancer screening  Diabetes screening  Glaucoma screening  Nutrition counseling   Advanced directives: requested   Subjective:  [name] is a 68 y.o. female who presents for Medicare Annual Wellness Visit and 3 month follow up.   [name] has a diagnosis of depression/anxiety and is currently on wellbutrin, sertraline and xanax 0.5 mg TID PRN, reports symptoms are well controlled on current regimen. [name] currently reports using xanax Very rarely - [name] reports [name] hasn't used in  months. [name] also has ADD and prescribed ritalin, takes 3-4 times per month.   Reports [name] has urge incontinence.  [name] is following with  urology at [facility] for this.  Reports [name] is using premerine cream for vaginal atrophy and doing pelvic floor therapy and myrbetriq with improvement. [name] does wear liner, 2-3 per day.   [name] has GERD and IBS well controlled by current medications.   [name] reports some memory changes over the past year; describes mainly as word-finding difficulty, denies any other symptoms. [name] feels this is worse in the past 6 months. [name] has had HIV screen neg in 2015, recent B12 was not deficient. [name] has never been screened for syphilis, no recent iron check. Thyroid functions have been stable   BMI is Body mass index is 28.52 kg/m., [name] has been working on diet and exercise. Wt Readings from Last 3 Encounters:  06/29/18 161 lb (73 kg)  04/18/18 164 lb (74.4 kg)  03/22/18 162 lb (73.5 kg)    Her blood pressure has been controlled at home, today their BP is BP: 110/70 [name] does not workout but is doing lots of intense housework. [name] denies chest pain, shortness of breath, dizziness.   [name] is not on cholesterol medication and denies myalgias. Her cholesterol is at goal by lifestyle modification. The cholesterol last visit was:   Lab Results  Component Value Date   CHOL 178 03/22/2018   HDL 61 03/22/2018   LDLCALC 99 03/22/2018   TRIG 89 03/22/2018   CHOLHDL 2.9 03/22/2018    [name] has been working on diet and exercise for glucose management, and denies increased appetite, nausea, paresthesia of the feet, polydipsia, polyuria, visual disturbances and vomiting. Last A1C in the office was:  Lab Results  Component Value Date   HGBA1C 5.4 03/22/2018   [name] is on thyroid medication. Her medication was not changed last visit.   Lab Results  Component Value Date   TSH 1.30 03/22/2018   [name] is followed by nephrology Dr. Katrine Coho  Lab Results  Component Value Date   GNFAOZHY 86 (L) 03/22/2018   Patient is on Vitamin D supplement and at goal at recent check:  Lab Results  Component Value Date   VD25OH  80 03/22/2018      Medication Review: Current Outpatient Medications on File Prior to Visit  Medication Sig Dispense Refill  . ALPRAZolam (XANAX) 0.5 MG tablet Take 1/2 to 1 tablet 3 x day if needed for anxiety or sleep 90 tablet 2  . Ascorbic Acid (VITAMIN C PO) Take 250 mg by mouth daily.     Marland Kitchen aspirin EC 81 MG tablet Take 81 mg by mouth daily.    Marland Kitchen atenolol (TENORMIN) 100 MG tablet TAKE 1 TABLET BY MOUTH ONCE DAILY 90 tablet 1  . B Complex Vitamins (B COMPLEX PO) Take by mouth daily.    Marland Kitchen buPROPion (WELLBUTRIN XL) 150 MG 24 hr tablet TAKE 3 TABLETS BY MOUTH EVERY MORNING FOR MOOD/DEPRESSION 270 tablet 1  . Cholecalciferol (VITAMIN D-3) 5000 UNITS TABS Take 5,000 Units by mouth daily.     Marland Kitchen conjugated estrogens (PREMARIN) vaginal cream Place 1 Applicatorful vaginally daily.    Marland Kitchen DICYCLOMINE HCL PO Take 10 mg by mouth 2 (two) times daily.     Marland Kitchen docusate sodium (COLACE) 100 MG capsule Take 100 mg by mouth 2 (two) times daily as needed for mild constipation.    . famotidine (PEPCID) 20 MG tablet Take 20 mg by mouth at  bedtime.    . hyoscyamine (LEVBID) 0.375 MG 12 hr tablet TAKE 1 TO 2 TABLETS BY MOUTH TWICE DAILY 120 tablet 1  . hyoscyamine (LEVSIN SL) 0.125 MG SL tablet PLACE 1 TABLET (0.125 MG TOTAL) UNDER THE TONGUE EVERY 4 (FOUR) HOURS AS NEEDED. 100 tablet 0  . levothyroxine (SYNTHROID, LEVOTHROID) 125 MCG tablet Take 1 tablet daily on an empty stomach with only water for 30 minutes & no Antacid meds for 4 hours 90 tablet 3  . Magnesium 250 MG TABS Take 250 mg by mouth daily.    . meclizine (ANTIVERT) 25 MG tablet TAKE 1 TABLET BY MOUTH 3 TIMES DAILY AS NEEDED FOR NAUSEA 90 tablet 1  . mirabegron ER (MYRBETRIQ) 25 MG TB24 tablet Take 25 mg by mouth daily.    . ondansetron (ZOFRAN) 8 MG tablet TAKE 1 TABLET BY MOUTH 3 TIMES A DAY AS NEEDED FOR NAUSEA 60 tablet 2  . sertraline (ZOLOFT) 100 MG tablet TAKE 1 TABLET BY MOUTH ONCE DAILY FOR MOOD AND DEPRESSION 90 tablet 1  . methylphenidate  (RITALIN) 20 MG tablet 1/2 to 1 tab 1 to 2 x / daily as needed for ADD or alertness 60 tablet 0   No current facility-administered medications on file prior to visit.     Allergies  Allergen Reactions  . Ace Inhibitors Other (See Comments)    Acute renal failure (lisinopril)  . Cephalexin Anaphylaxis  . Macrobid [Nitrofurantoin] Other (See Comments)    Causes hyperthermia   . Codeine Nausea Only  . Latex Rash  . Sulfa Antibiotics Other (See Comments)    Do not take per nephrology    Current Problems (verified) Patient Active Problem List   Diagnosis Date Noted  . Colon polyps 06/28/2018  . Thrombocytopenia ([place]) 06/19/2015  . CKD Stage 3 ([place]) 01/10/2015  . IBS  01/10/2015  . Mixed hyperlipidemia 06/12/2014  . Medication management 06/12/2014  . LBBB (left bundle branch block) 07/24/2013  . HTN (hypertension) 07/24/2013  . Depression, controlled   . Diverticulosis   . ADHD (attention deficit hyperactivity disorder)   . OCD (obsessive compulsive disorder)   . Other abnormal glucose   . Migraines   . Vitamin D deficiency   . Hypothyroidism 04/05/2013    Screening Tests Immunization History  Administered Date(s) Administered  . Influenza, High Dose Seasonal PF 03/22/2017, 02/16/2018  . Influenza-Unspecified 01/31/2016  . Pneumococcal Conjugate-13 03/22/2017  . Pneumococcal Polysaccharide-23 02/12/1999, 06/29/2018  . Td 04/27/2013  . Zoster 11/24/2005    Preventative care: Last colonoscopy: 10/2012 - due f/u 10/2017 Dr. Fuller Plan, patient will schedule  Last mammogram: 12/2017 Last pap smear/pelvic exam: 2018  DEXA: 2010, DUE will schedule with new mammogram   Prior vaccinations: TD or Tdap: 2015  Influenza: 2019 at pharmacy  Pneumococcal: 2000, DUE  Prevnar13:  2018 Shingles/Zostavax: 2007  Names of Other Physician/Practitioners you currently use: 1. [facility] here for primary care 2. Dr. Delman Cheadle, eye doctor, last visit  2020 3. Dr. Woodfin Ganja, dentist, last visit 2019   Patient Care Team: Unk [name], MD as PCP - General (Internal Medicine) Rockey Situ (Urology) Donato Heinz, MD as Consulting Physician (Nephrology) Melissa Noon, [place] as Referring Physician (Optometry) Ladene Artist, MD as Consulting Physician (Gastroenterology) Josue Hector, MD as Consulting Physician (Cardiology)  SURGICAL HISTORY [name]  has a past surgical history that includes Tonsillectomy; Oophorectomy; Bunionectomy; Hemorrhoid surgery; Breast enhancement surgery; Colon surgery; and ORIF ankle fracture (Left, 08/30/2017). FAMILY HISTORY Her family  history includes Colon polyps in her mother. SOCIAL HISTORY [name]  reports that [name] quit smoking about 16 years ago. Her smoking use included cigarettes. [name] has a 40.00 pack-year smoking history. [name] has never used smokeless tobacco. [name] reports that [name] does not drink alcohol or use drugs.   MEDICARE WELLNESS OBJECTIVES: Physical activity: Current Exercise Habits: The patient does not participate in regular exercise at present, Exercise limited by: None identified Cardiac risk factors: Cardiac Risk Factors include: advanced age (>26men, >78 women);hypertension;sedentary lifestyle;smoking/ tobacco exposure Depression/mood screen:   Depression screen [facility] 2/9 06/29/2018  Decreased Interest 0  Down, Depressed, Hopeless 0  PHQ - 2 Score 0    ADLs:  In your present state of health, do you have any difficulty performing the following activities: 06/29/2018 12/15/2017  Hearing? N N  Vision? Y N  Comment blind spot in vision, seeing Dr. Delman Cheadle -  Difficulty concentrating or making decisions? N N  Walking or climbing stairs? N N  Dressing or bathing? N N  Doing errands, shopping? N N  Some recent data might be hidden     Cognitive Testing  Alert? Yes  Normal Appearance?Yes  Oriented to person? Yes  Place? Yes   Time? Yes  Recall of three objects?  Yes  Can perform simple  calculations? Yes  Displays appropriate judgment?Yes  Can read the correct time from a watch face?Yes  EOL planning: Does Patient Have a Medical Advance Directive?: Yes Type of Advance Directive: Healthcare Power of Attorney, Living will Does patient want to make changes to medical advance directive?: No - Patient declined Copy of [place] in Chart?: No - copy requested  Review of Systems  Constitutional: Negative for malaise/fatigue and weight loss.  HENT: Negative for hearing loss and tinnitus.   Eyes: Negative for blurred vision and double vision.  Respiratory: Negative for cough, shortness of breath and wheezing.   Cardiovascular: Negative for chest pain, palpitations, orthopnea, claudication and leg swelling.  Gastrointestinal: Negative for abdominal pain, blood in stool, constipation, diarrhea, heartburn, melena, nausea and vomiting.  Genitourinary: Negative.   Musculoskeletal: Positive for falls. Negative for joint pain and myalgias.  Skin: Negative for rash.  Neurological: Negative for dizziness, tingling, sensory change, weakness and headaches.  Endo/Heme/Allergies: Negative for polydipsia.  Psychiatric/Behavioral: Positive for memory loss. Negative for depression, substance abuse and suicidal ideas. The patient is not nervous/anxious and does not have insomnia.   All other systems reviewed and are negative.    Objective:     Today's Vitals   06/29/18 1600  BP: 110/70  Pulse: (!) 54  Temp: (!) 97.5 F (36.4 C)  SpO2: 99%  Weight: 161 lb (73 kg)  Height: 5\' 3"  (1.6 m)   Body mass index is 28.52 kg/m.  General appearance: alert, no distress, WD/WN, female HEENT: normocephalic, sclerae anicteric, TMs pearly, nares patent, no discharge or erythema, pharynx normal Oral cavity: MMM, no lesions Neck: supple, no lymphadenopathy, no thyromegaly, no masses Heart: RRR, normal S1, S2, no murmurs Lungs: CTA bilaterally, no wheezes, rhonchi, or  rales Abdomen: +bs, soft, non tender, non distended, no masses, no hepatomegaly, no splenomegaly Musculoskeletal: nontender, no swelling, no obvious deformity Extremities: no edema, no cyanosis, no clubbing - left toe injected with healing scabs Pulses: 2+ symmetric, upper and lower extremities, normal cap refill Neurological: alert, oriented x 3, CN2-12 intact, strength normal upper extremities and lower extremities, sensation normal throughout, DTRs 2+ throughout, no cerebellar signs, gait normal Psychiatric: normal affect, behavior normal,  pleasant   Medicare Attestation I have personally reviewed: The patient's medical and social history Their use of alcohol, tobacco or illicit drugs Their current medications and supplements The patient's functional ability including ADLs,fall risks, home safety risks, cognitive, and hearing and visual impairment Diet and physical activities Evidence for depression or mood disorders  The patient's weight, height, BMI, and visual acuity have been recorded in the chart.  I have made referrals, counseling, and provided education to the patient based on review of the above and I have provided the patient with a written personalized care plan for preventive services.     Izora Ribas, NP   06/29/2018

## 2018-06-29 ENCOUNTER — Ambulatory Visit (INDEPENDENT_AMBULATORY_CARE_PROVIDER_SITE_OTHER): Payer: PPO | Admitting: Adult Health

## 2018-06-29 ENCOUNTER — Encounter: Payer: Self-pay | Admitting: Adult Health

## 2018-06-29 VITALS — BP 110/70 | HR 54 | Temp 97.5°F | Ht 63.0 in | Wt 161.0 lb

## 2018-06-29 DIAGNOSIS — R413 Other amnesia: Secondary | ICD-10-CM

## 2018-06-29 DIAGNOSIS — I10 Essential (primary) hypertension: Secondary | ICD-10-CM | POA: Diagnosis not present

## 2018-06-29 DIAGNOSIS — F429 Obsessive-compulsive disorder, unspecified: Secondary | ICD-10-CM

## 2018-06-29 DIAGNOSIS — E559 Vitamin D deficiency, unspecified: Secondary | ICD-10-CM

## 2018-06-29 DIAGNOSIS — F32A Depression, unspecified: Secondary | ICD-10-CM

## 2018-06-29 DIAGNOSIS — N183 Chronic kidney disease, stage 3 (moderate): Secondary | ICD-10-CM

## 2018-06-29 DIAGNOSIS — Z0001 Encounter for general adult medical examination with abnormal findings: Secondary | ICD-10-CM | POA: Diagnosis not present

## 2018-06-29 DIAGNOSIS — K589 Irritable bowel syndrome without diarrhea: Secondary | ICD-10-CM

## 2018-06-29 DIAGNOSIS — F900 Attention-deficit hyperactivity disorder, predominantly inattentive type: Secondary | ICD-10-CM

## 2018-06-29 DIAGNOSIS — F329 Major depressive disorder, single episode, unspecified: Secondary | ICD-10-CM

## 2018-06-29 DIAGNOSIS — E782 Mixed hyperlipidemia: Secondary | ICD-10-CM | POA: Diagnosis not present

## 2018-06-29 DIAGNOSIS — Z23 Encounter for immunization: Secondary | ICD-10-CM

## 2018-06-29 DIAGNOSIS — R6889 Other general symptoms and signs: Secondary | ICD-10-CM | POA: Diagnosis not present

## 2018-06-29 DIAGNOSIS — E039 Hypothyroidism, unspecified: Secondary | ICD-10-CM | POA: Diagnosis not present

## 2018-06-29 DIAGNOSIS — I447 Left bundle-branch block, unspecified: Secondary | ICD-10-CM | POA: Diagnosis not present

## 2018-06-29 DIAGNOSIS — D696 Thrombocytopenia, unspecified: Secondary | ICD-10-CM | POA: Diagnosis not present

## 2018-06-29 DIAGNOSIS — K5790 Diverticulosis of intestine, part unspecified, without perforation or abscess without bleeding: Secondary | ICD-10-CM | POA: Diagnosis not present

## 2018-06-29 DIAGNOSIS — G43909 Migraine, unspecified, not intractable, without status migrainosus: Secondary | ICD-10-CM

## 2018-06-29 DIAGNOSIS — Z79899 Other long term (current) drug therapy: Secondary | ICD-10-CM | POA: Diagnosis not present

## 2018-06-29 DIAGNOSIS — Z6828 Body mass index (BMI) 28.0-28.9, adult: Secondary | ICD-10-CM

## 2018-06-29 DIAGNOSIS — N1831 Chronic kidney disease, stage 3a: Secondary | ICD-10-CM

## 2018-06-29 DIAGNOSIS — R7309 Other abnormal glucose: Secondary | ICD-10-CM | POA: Diagnosis not present

## 2018-06-29 DIAGNOSIS — Z0000 Encounter for general adult medical examination without abnormal findings: Secondary | ICD-10-CM

## 2018-06-29 DIAGNOSIS — K635 Polyp of colon: Secondary | ICD-10-CM

## 2018-06-29 NOTE — Patient Instructions (Addendum)
  [name] , Thank you for taking time to come for your Medicare Wellness Visit. I appreciate your ongoing commitment to your health goals. Please review the following plan we discussed and let me know if I can assist you in the future.   These are the goals we discussed: Goals    . Exercise 150 min/wk Moderate Activity       This is a list of the screening recommended for you and due dates:  Health Maintenance  Topic Date Due  . Colon Cancer Screening  11/16/2017  . Mammogram  12/30/2019  . Tetanus Vaccine  04/28/2023  . Flu Shot  Completed  . DEXA scan (bone density measurement)  Completed  .  Hepatitis C: One time screening is recommended by Center for Disease Control  (CDC) for  adults born from [birth year] through 1964.   Completed  . Pneumonia vaccines  Completed    Schedule colonoscopy  Schedule bone density with next mammogram    [employer] Test with no obligation # [phone] Do not have to be a member Tues-Sat 10-6  [place]- free test with no obligation # [phone] MUST BE A MEMBER Call for store hours  Have had patient's get good cheaper hearing aids from [employer] The air version has good reviews.     Aim for 7+ servings of fruits and vegetables daily  65-80+ fluid ounces of water or unsweet tea for healthy kidneys  Limit to max 1 drink of alcohol per day; avoid smoking/tobacco  Limit animal fats in diet for cholesterol and heart health - choose grass fed whenever available  Avoid highly processed foods, and foods high in saturated/trans fats  Aim for low stress - take time to unwind and care for your mental health  Aim for 150 min of moderate intensity exercise weekly for heart health, and weights twice weekly for bone health  Aim for 7-9 hours of sleep daily

## 2018-06-30 ENCOUNTER — Other Ambulatory Visit: Payer: Self-pay | Admitting: Adult Health

## 2018-06-30 DIAGNOSIS — R413 Other amnesia: Secondary | ICD-10-CM

## 2018-06-30 DIAGNOSIS — Z7251 High risk heterosexual behavior: Secondary | ICD-10-CM

## 2018-06-30 DIAGNOSIS — A530 Latent syphilis, unspecified as early or late: Secondary | ICD-10-CM

## 2018-06-30 LAB — CBC WITH DIFFERENTIAL/PLATELET
Absolute Monocytes: 381 cells/uL (ref 200–950)
Basophils Absolute: 28 cells/uL (ref 0–200)
Basophils Relative: 0.6 %
Eosinophils Absolute: 432 cells/uL (ref 15–500)
Eosinophils Relative: 9.2 %
HCT: 41.5 % (ref 35.0–45.0)
Hemoglobin: 13.7 g/dL (ref 11.7–15.5)
Lymphs Abs: 1894 cells/uL (ref 850–3900)
MCH: 30.2 pg (ref 27.0–33.0)
MCHC: 33 g/dL (ref 32.0–36.0)
MCV: 91.4 fL (ref 80.0–100.0)
MPV: 10.4 fL (ref 7.5–12.5)
Monocytes Relative: 8.1 %
Neutro Abs: 1965 cells/uL (ref 1500–7800)
Neutrophils Relative %: 41.8 %
Platelets: 315 10*3/uL (ref 140–400)
RBC: 4.54 10*6/uL (ref 3.80–5.10)
RDW: 13.1 % (ref 11.0–15.0)
Total Lymphocyte: 40.3 %
WBC: 4.7 10*3/uL (ref 3.8–10.8)

## 2018-06-30 LAB — COMPLETE METABOLIC PANEL WITH GFR
AG Ratio: 1.5 (calc) (ref 1.0–2.5)
ALT: 13 U/L (ref 6–29)
AST: 16 U/L (ref 10–35)
Albumin: 3.9 g/dL (ref 3.6–5.1)
Alkaline phosphatase (APISO): 71 U/L (ref 37–153)
BUN/Creatinine Ratio: 21 (calc) (ref 6–22)
BUN: 23 mg/dL (ref 7–25)
CO2: 32 mmol/L (ref 20–32)
CREATININE: 1.09 mg/dL — AB (ref 0.50–0.99)
Calcium: 9.4 mg/dL (ref 8.6–10.4)
Chloride: 102 mmol/L (ref 98–110)
GFR, Est African American: 61 mL/min/{1.73_m2} (ref >=60)
GFR, Est Non African American: 52 mL/min/{1.73_m2} — ABNORMAL LOW (ref >=60)
Globulin: 2.6 g/dL (calc) (ref 1.9–3.7)
Glucose, Bld: 88 mg/dL (ref 65–99)
Potassium: 4.6 mmol/L (ref 3.5–5.3)
Sodium: 138 mmol/L (ref 135–146)
Total Bilirubin: 0.3 mg/dL (ref 0.2–1.2)
Total Protein: 6.5 g/dL (ref 6.1–8.1)

## 2018-06-30 LAB — TSH: TSH: 3.45 mIU/L (ref 0.40–4.50)

## 2018-06-30 LAB — RPR TITER: RPR Titer: 1:1 {titer} — ABNORMAL HIGH

## 2018-06-30 LAB — RPR: RPR Ser Ql: REACTIVE — AB

## 2018-06-30 LAB — LIPID PANEL
Cholesterol: 194 mg/dL (ref <200)
HDL: 66 mg/dL (ref >=50)
LDL Cholesterol (Calc): 108 mg/dL (calc) — ABNORMAL HIGH
Non-HDL Cholesterol (Calc): 128 mg/dL (calc) (ref <130)
Total CHOL/HDL Ratio: 2.9 (calc) (ref <5.0)
Triglycerides: 104 mg/dL (ref <150)

## 2018-06-30 LAB — FLUORESCENT TREPONEMAL AB(FTA)-IGG-BLD: Fluorescent Treponemal ABS: REACTIVE — AB

## 2018-06-30 LAB — FERRITIN: Ferritin: 15 ng/mL — ABNORMAL LOW (ref 16–288)

## 2018-06-30 NOTE — Progress Notes (Signed)
Called patient to discuss positive RPR and weak+ titer obtained in light of reported memory changes for past 1-2 years, progressive in last 6 months, with increased "clumbsiness" reported by patient. Fluorescent treponemal ab(fta)-IgG-bld is pending. With her permission, will refer to neurology for further workup as she may need LP. She expresses her agreement. Will also schedule her for lab visit to screen for HIV, GC/Chl, has had neg hepatitis panel in 2015. She reports not sexually active in 15 years since divorce.

## 2018-07-01 ENCOUNTER — Other Ambulatory Visit: Payer: Self-pay | Admitting: Adult Health

## 2018-07-01 ENCOUNTER — Ambulatory Visit (INDEPENDENT_AMBULATORY_CARE_PROVIDER_SITE_OTHER): Payer: PPO

## 2018-07-01 DIAGNOSIS — A530 Latent syphilis, unspecified as early or late: Secondary | ICD-10-CM

## 2018-07-01 DIAGNOSIS — I10 Essential (primary) hypertension: Secondary | ICD-10-CM

## 2018-07-01 DIAGNOSIS — Z7251 High risk heterosexual behavior: Secondary | ICD-10-CM

## 2018-07-01 NOTE — Progress Notes (Signed)
Patient presents for bld lab work which was entered into epic by the ordering provider. Vitals entered into epic at this time of intake.

## 2018-07-04 LAB — C. TRACHOMATIS/N. GONORRHOEAE RNA
C. trachomatis RNA, TMA: NOT DETECTED
N. gonorrhoeae RNA, TMA: NOT DETECTED

## 2018-07-04 LAB — HIV ANTIBODY (ROUTINE TESTING W REFLEX): HIV 1&2 Ab, 4th Generation: NONREACTIVE

## 2018-07-05 ENCOUNTER — Ambulatory Visit: Payer: Self-pay

## 2018-07-12 ENCOUNTER — Other Ambulatory Visit: Payer: Self-pay | Admitting: Internal Medicine

## 2018-07-12 MED FILL — OSCIMIN SR 0.375 MG TABLET: 0.375 | 15 days supply | Qty: 60 | Fill #1

## 2018-07-12 MED FILL — ALPRAZolam 0.5 MG TABS: 0.5 | 30 days supply | Qty: 90 | Fill #0

## 2018-07-12 MED FILL — DICYCLOMINE 10 MG CAPSULE: 10 | 90 days supply | Qty: 270 | Fill #1

## 2018-08-08 MED FILL — SERTRALINE HCL 100 MG TAB: 100 | 90 days supply | Qty: 90 | Fill #1

## 2018-08-08 MED FILL — MYRBETRIQ ER 25 MG TABLET: 25 | 30 days supply | Qty: 30 | Fill #2

## 2018-08-10 DIAGNOSIS — N390 Urinary tract infection, site not specified: Secondary | ICD-10-CM | POA: Diagnosis not present

## 2018-08-10 MED ORDER — CIPROFLOXACIN HCL 500 MG PO TABS: 500.0000 mg | ORAL_TABLET | Freq: Two times a day (BID) | ORAL | 0 refills | Status: AC

## 2018-08-10 NOTE — Telephone Encounter (Signed)
Virtual Visit via Telephone Note  I connected with [name] on @TODAY @ at  by telephone and verified that I am speaking with the correct person using two identifiers.   I discussed the limitations, risks, security and privacy concerns of performing an evaluation and management service by telephone and the availability of in person appointments. I also discussed with the patient that there may be a patient responsible charge related to this service. The patient expressed understanding and agreed to proceed.   History of Present Illness:  68 y.o. with hx of recurrent UTIs, reports dysuria, frequency, urgency - then will only pass a few drops, reports cloudy urine with foul odor. Typical UTI presentation for her. Last was approx 3 months ago and was treated via GYN provider. She denies fever/chills, abdominal pain, nausea/vomiting.   Denies vaginal discharge, perineal burning/itching. Not sexually active.   Has been pushing water, cranberry supplements for several days without improvement.    Observations/Objective:  General : Well sounding patient in no apparent distress HEENT: no hoarseness, no cough for duration of visit Lungs: speaks in complete sentences, no audible wheezing, no apparent distress Neurological: alert, oriented x 3 Psychiatric: pleasant, judgement appropriate   Assessment and Plan:  Diagnoses and all orders for this visit:  Urinary tract infection without hematuria, site unspecified Medications: ciprofloxacin. Maintain adequate hydration. Follow up if symptoms not improving, and as needed. -     ciprofloxacin (CIPRO) 500 MG tablet; Take 1 tablet (500 mg total) by mouth 2 (two) times daily for 7 days.   Follow Up Instructions:    I discussed the assessment and treatment plan with the patient. The patient was provided an opportunity to ask questions and all were answered. The patient agreed with the plan and demonstrated an understanding of the  instructions.   The patient was advised to call back or seek an in-person evaluation if the symptoms worsen or if the condition fails to improve as anticipated.  I provided 15 minutes of non-face-to-face time during this encounter.   Izora Ribas, NP

## 2018-08-29 ENCOUNTER — Other Ambulatory Visit: Payer: Self-pay | Admitting: Internal Medicine

## 2018-08-29 DIAGNOSIS — I10 Essential (primary) hypertension: Secondary | ICD-10-CM

## 2018-08-29 MED ORDER — ATENOLOL 100 MG PO TABS: ORAL_TABLET | ORAL | 3 refills | Status: DC

## 2018-08-29 MED FILL — LEVOTHYROXINE 125 MCG TABLE: 125 | 90 days supply | Qty: 90 | Fill #1

## 2018-08-29 MED FILL — ATENOLOL 100 MG TABLET: 100 | 90 days supply | Qty: 90 | Fill #0

## 2018-09-01 DIAGNOSIS — G3184 Mild cognitive impairment, so stated: Secondary | ICD-10-CM | POA: Diagnosis not present

## 2018-09-02 ENCOUNTER — Encounter (INDEPENDENT_AMBULATORY_CARE_PROVIDER_SITE_OTHER): Payer: PPO

## 2018-09-02 DIAGNOSIS — R300 Dysuria: Secondary | ICD-10-CM

## 2018-09-02 DIAGNOSIS — N390 Urinary tract infection, site not specified: Secondary | ICD-10-CM

## 2018-09-02 MED ORDER — AMOXICILLIN-POT CLAVULANATE 875-125 MG PO TABS: 1.0000 | ORAL_TABLET | Freq: Two times a day (BID) | ORAL | 0 refills | Status: AC

## 2018-09-02 NOTE — Telephone Encounter (Signed)
Virtual Visit via Telephone Note  I connected with [name] on 09/02/2018 at  by telephone and verified that I am speaking with the correct person using two identifiers.  Location: Patient: home Provider: [place] home   I discussed the limitations, risks, security and privacy concerns of performing an evaluation and management service by telephone and the availability of in person appointments. I also discussed with the patient that there may be a patient responsible charge related to this service. The patient expressed understanding and agreed to proceed.   History of Present Illness:  68 y.o. female reports 1 day of dysuria; she endorses frequency and urgency worse from baseline. She reports urine is somewhat cloudy. She denies fever/chills, flank pain, lower back pain. Denies N/V/abdomial. Denies perineal burning, vaginal discharge. She is not sexually active.   She has hx of frequent recurrent UTI and states this feels like typical symptoms for her   Observations/Objective:  General : Well sounding patient in no apparent distress HEENT: no hoarseness, no cough for duration of visit Lungs: speaks in complete sentences, no audible wheezing, no apparent distress Neurological: alert, oriented x 3 Psychiatric: pleasant, judgement appropriate    Assessment and Plan:  Diagnoses and all orders for this visit:  Urinary tract infection without hematuria, site unspecified Medications: augmentin . Maintain adequate hydration. Follow up if symptoms not improving, and as needed. -     amoxicillin-clavulanate (AUGMENTIN) 875-125 MG tablet; Take 1 tablet by mouth 2 (two) times daily for 7 days.  Follow Up Instructions:    I discussed the assessment and treatment plan with the patient. The patient was provided an opportunity to ask questions and all were answered. The patient agreed with the plan and demonstrated an understanding of the instructions.   The patient was advised to call  back or seek an in-person evaluation if the symptoms worsen or if the condition fails to improve as anticipated.  I provided 12 minutes of non-face-to-face time during this encounter.   Izora Ribas, NP

## 2018-09-18 MED FILL — buPROPion HCL ER (XL) 150 M: 150 | 90 days supply | Qty: 270 | Fill #1

## 2018-09-20 MED FILL — ONDANSETRON HCL 8 MG TABLET: 8 | 20 days supply | Qty: 60 | Fill #1

## 2018-09-20 MED FILL — MYRBETRIQ ER 25 MG TABLET: 25 | 30 days supply | Qty: 30 | Fill #3

## 2018-09-20 MED FILL — OSCIMIN SR 0.375 MG TABLET: 0.375 | 15 days supply | Qty: 60 | Fill #2

## 2018-09-21 DIAGNOSIS — A539 Syphilis, unspecified: Secondary | ICD-10-CM | POA: Diagnosis not present

## 2018-09-27 DIAGNOSIS — G3184 Mild cognitive impairment, so stated: Secondary | ICD-10-CM | POA: Diagnosis not present

## 2018-09-27 DIAGNOSIS — R413 Other amnesia: Secondary | ICD-10-CM | POA: Diagnosis not present

## 2018-10-04 DIAGNOSIS — A539 Syphilis, unspecified: Secondary | ICD-10-CM | POA: Diagnosis not present

## 2018-10-04 DIAGNOSIS — R930 Abnormal findings on diagnostic imaging of skull and head, not elsewhere classified: Secondary | ICD-10-CM | POA: Diagnosis not present

## 2018-10-04 DIAGNOSIS — A530 Latent syphilis, unspecified as early or late: Secondary | ICD-10-CM | POA: Diagnosis not present

## 2018-10-04 DIAGNOSIS — R413 Other amnesia: Secondary | ICD-10-CM | POA: Diagnosis not present

## 2018-10-11 DIAGNOSIS — R413 Other amnesia: Secondary | ICD-10-CM | POA: Diagnosis not present

## 2018-10-11 DIAGNOSIS — A530 Latent syphilis, unspecified as early or late: Secondary | ICD-10-CM | POA: Diagnosis not present

## 2018-10-12 DIAGNOSIS — N183 Chronic kidney disease, stage 3 (moderate): Secondary | ICD-10-CM | POA: Diagnosis not present

## 2018-10-12 DIAGNOSIS — N390 Urinary tract infection, site not specified: Secondary | ICD-10-CM | POA: Diagnosis not present

## 2018-10-12 DIAGNOSIS — I129 Hypertensive chronic kidney disease with stage 1 through stage 4 chronic kidney disease, or unspecified chronic kidney disease: Secondary | ICD-10-CM | POA: Diagnosis not present

## 2018-10-13 ENCOUNTER — Other Ambulatory Visit: Payer: Self-pay | Admitting: Adult Health

## 2018-10-18 DIAGNOSIS — A530 Latent syphilis, unspecified as early or late: Secondary | ICD-10-CM | POA: Diagnosis not present

## 2018-11-11 ENCOUNTER — Other Ambulatory Visit: Payer: Self-pay | Admitting: Adult Health

## 2018-11-11 DIAGNOSIS — R51 Headache: Secondary | ICD-10-CM | POA: Diagnosis not present

## 2018-11-12 MED FILL — SERTRALINE HCL 100 MG TAB: 100 | 90 days supply | Qty: 90 | Fill #0

## 2018-11-15 ENCOUNTER — Other Ambulatory Visit: Payer: Self-pay | Admitting: Internal Medicine

## 2018-11-15 ENCOUNTER — Encounter: Payer: Self-pay | Admitting: Adult Health

## 2018-11-15 ENCOUNTER — Ambulatory Visit (INDEPENDENT_AMBULATORY_CARE_PROVIDER_SITE_OTHER): Payer: PPO | Admitting: Adult Health

## 2018-11-15 ENCOUNTER — Other Ambulatory Visit: Payer: Self-pay

## 2018-11-15 VITALS — BP 128/86 | HR 70 | Temp 97.3°F | Wt 162.2 lb

## 2018-11-15 DIAGNOSIS — N644 Mastodynia: Secondary | ICD-10-CM | POA: Diagnosis not present

## 2018-11-15 DIAGNOSIS — Z978 Presence of other specified devices: Secondary | ICD-10-CM | POA: Diagnosis not present

## 2018-11-15 DIAGNOSIS — R300 Dysuria: Secondary | ICD-10-CM

## 2018-11-15 DIAGNOSIS — Z1231 Encounter for screening mammogram for malignant neoplasm of breast: Secondary | ICD-10-CM

## 2018-11-15 DIAGNOSIS — T8543XA Leakage of breast prosthesis and implant, initial encounter: Secondary | ICD-10-CM

## 2018-11-15 NOTE — Progress Notes (Signed)
Assessment and Plan:  Sosha was seen today for breast problem.  Diagnoses and all orders for this visit:  Rupture of implant of right breast, initial encounter Rupture known per last mammogram in 12/2017 but MRI was never pursued Now symptomatic and abnormal exam Will pusue MRI and proceed with plastics referral for discussion, pt would like both removed completely  Follow up with any significant changes prior to plastics appointment  -     Ambulatory referral to [place]; Future  Dysuria -     Urinalysis w microscopic + reflex cultur   Further disposition pending results of labs. Discussed med's effects and SE's.   Over 15 minutes of exam, counseling, chart review, and critical decision making was performed.   Future Appointments  Date Time Provider [place]  04/19/2019  2:00 PM Unk [name], MD [facility] None  07/19/2019  3:00 PM Liane Comber, NP [facility] None    ------------------------------------------------------------------------------------------------------------------   HPI BP 128/86   Pulse 70   Temp (!) 97.3 F (36.3 C)   Wt 162 lb 3.2 oz (73.6 kg)   BMI 28.73 kg/m   68 y.o.female presents for concern with ruptured implant; had silicone implants 40 years ago, started experiencing some discomfort, could feel underneath the implant around 2 weeks ago when doing self breast exam, noted some discomfort in shoulder and arm, started having some tingling/burning sensation in chest.   On review, had mammogram in 12/2017 via breast center which did already show rupture:    There appears to be a right implant leak/rupture with extracapsular silicone seen superior to the implant. This is a new finding. There is a bulge along the medial aspect of the left implant which is Stable.  Patient was not aware of this, MRI was not pursued at that time.   She does have hx of R shoulder pain, established with ortho for  this.   Past Medical History:  Diagnosis Date  . Chronic kidney disease (CKD), stage III (moderate) (HCC)   . Diverticulosis   . Emphysema lung ([place])   . Helicobacter pylori (H. pylori)   . History of IBS   . IBS (irritable bowel syndrome)   . LBBB (left bundle branch block)   . Lower urinary tract infection 06/21/2017  . Migraines   . OCD (obsessive compulsive disorder)   . Prediabetes    pcp  . Renal disorder    AKI due to lisinpril, his. pyelonephritis,   . Sepsis ([place])   . Sleep apnea    no CPAP  . Vitamin D deficiency      Allergies  Allergen Reactions  . Ace Inhibitors Other (See Comments)    Acute renal failure (lisinopril)  . Cephalexin Anaphylaxis  . Macrobid [Nitrofurantoin] Other (See Comments)    Causes hyperthermia   . Codeine Nausea Only  . Latex Rash  . Sulfa Antibiotics Other (See Comments)    Do not take per nephrology    Current Outpatient Medications on File Prior to Visit  Medication Sig  . ALPRAZolam (XANAX) 0.5 MG tablet TAKE 1/2 TO 1 TABLET BY MOUTH THREE TIMES A DAY IF NEEDED FOR ANXIETY OR SLEEP  . Ascorbic Acid (VITAMIN C PO) Take 250 mg by mouth daily.   Marland Kitchen aspirin EC 81 MG tablet Take 81 mg by mouth daily.  Marland Kitchen atenolol (TENORMIN) 100 MG tablet Take 1 tablet Daily for BP  . B Complex Vitamins (B COMPLEX PO) Take  by mouth daily.  Marland Kitchen buPROPion (WELLBUTRIN XL) 150 MG 24 hr tablet TAKE 3 TABLETS BY MOUTH EVERY MORNING FOR MOOD/DEPRESSION  . Cholecalciferol (VITAMIN D-3) 5000 UNITS TABS Take 5,000 Units by mouth daily.   Marland Kitchen conjugated estrogens (PREMARIN) vaginal cream Place 1 Applicatorful vaginally daily.  Marland Kitchen DICYCLOMINE HCL PO Take 10 mg by mouth 2 (two) times daily.   Marland Kitchen docusate sodium (COLACE) 100 MG capsule Take 100 mg by mouth 2 (two) times daily as needed for mild constipation.  . famotidine (PEPCID) 20 MG tablet Take 20 mg by mouth at bedtime.  . hyoscyamine (LEVBID) 0.375 MG 12 hr tablet TAKE 1 TO 2 TABLETS BY MOUTH TWICE DAILY  .  hyoscyamine (LEVSIN SL) 0.125 MG SL tablet PLACE 1 TABLET (0.125 MG TOTAL) UNDER THE TONGUE EVERY 4 (FOUR) HOURS AS NEEDED.  Marland Kitchen levothyroxine (SYNTHROID, LEVOTHROID) 125 MCG tablet Take 1 tablet daily on an empty stomach with only water for 30 minutes & no Antacid meds for 4 hours  . Magnesium 250 MG TABS Take 250 mg by mouth daily.  . meclizine (ANTIVERT) 25 MG tablet TAKE 1 TABLET BY MOUTH 3 TIMES DAILY AS NEEDED FOR NAUSEA  . ondansetron (ZOFRAN) 8 MG tablet TAKE 1 TABLET BY MOUTH 3 TIMES A DAY AS NEEDED FOR NAUSEA  . sertraline (ZOLOFT) 100 MG tablet Take 1 tablet Daily for Mood / Depression  . methylphenidate (RITALIN) 20 MG tablet 1/2 to 1 tab 1 to 2 x / daily as needed for ADD or alertness (Patient not taking: Reported on 11/15/2018)  . mirabegron ER (MYRBETRIQ) 25 MG TB24 tablet Take 25 mg by mouth daily.   No current facility-administered medications on file prior to visit.     ROS: Review of Systems  Constitutional: Negative for chills, fever, malaise/fatigue and weight loss.  HENT: Negative.   Eyes: Negative.   Respiratory: Negative for cough, shortness of breath and wheezing.   Cardiovascular: Positive for chest pain (Over right upper breast). Negative for palpitations and leg swelling.  Gastrointestinal: Negative for abdominal pain, nausea and vomiting.  Genitourinary: Positive for dysuria (intermittent).  Musculoskeletal: Positive for joint pain (right shoulder, right upper arm). Negative for myalgias.  Skin: Positive for rash. Negative for itching.  Neurological: Negative for dizziness and headaches.     Physical Exam:  BP 128/86   Pulse 70   Temp (!) 97.3 F (36.3 C)   Wt 162 lb 3.2 oz (73.6 kg)   BMI 28.73 kg/m   General Appearance: Well nourished, in no apparent distress. Eyes: PERRLA, conjunctiva no swelling or erythema ENT/Mouth: Hearing normal.  Neck: Supple Respiratory: Respiratory effort normal, BS equal bilaterally without rales, rhonchi, wheezing or  stridor.  Cardio: RRR with no MRGs. Brisk peripheral pulses without edema.  Abdomen: Soft, + BS.  Non tender Lymphatics: Non tender without lymphadenopathy.  Musculoskeletal: Full ROM, no effusion, popping clicking in R shoulder, 5/5 strength, normal gait.  Skin: Warm, dry lesions, ecchymosis. She has area of erythema without distinct lesions, discharge or heat to lateral R breast inferiorly, approx 5 x 3 cm area Breasts: left breast with intact implant, normal appearance and texture throughout; right breast with hard texture lower/lateral, erythema over area, implant is lifted with edge palpable Neuro: Sensation intact.  Psych: Awake and oriented X 3, normal affect, Insight and Judgment appropriate.     Izora Ribas, NP 11:15 AM [facility]

## 2018-11-16 ENCOUNTER — Other Ambulatory Visit: Payer: Self-pay | Admitting: Adult Health

## 2018-11-16 DIAGNOSIS — E2839 Other primary ovarian failure: Secondary | ICD-10-CM

## 2018-11-16 LAB — URINALYSIS W MICROSCOPIC + REFLEX CULTURE
Bacteria, UA: NONE SEEN /HPF
Bilirubin Urine: NEGATIVE
Glucose, UA: NEGATIVE
Hgb urine dipstick: NEGATIVE
Hyaline Cast: NONE SEEN /LPF
Ketones, ur: NEGATIVE
Leukocyte Esterase: NEGATIVE
Nitrites, Initial: NEGATIVE
Protein, ur: NEGATIVE
RBC / HPF: NONE SEEN /HPF (ref 0–2)
Specific Gravity, Urine: 1.008 (ref 1.001–1.03)
Squamous Epithelial / HPF: NONE SEEN /HPF (ref <=5)
WBC, UA: NONE SEEN /HPF (ref 0–5)
pH: 7.5 (ref 5.0–8.0)

## 2018-11-16 LAB — NO CULTURE INDICATED

## 2018-11-16 NOTE — Addendum Note (Signed)
Addended by: Izora Ribas on: 11/16/2018 09:35 AM   Modules accepted: Orders

## 2018-11-18 MED FILL — ATENOLOL 100 MG TABLET: 100 | 90 days supply | Qty: 90 | Fill #1

## 2018-11-18 MED FILL — LEVOTHYROXINE 125 MCG TABLE: 125 | 90 days supply | Qty: 90 | Fill #2

## 2018-11-23 ENCOUNTER — Ambulatory Visit
Admission: RE | Admit: 2018-11-23 | Discharge: 2018-11-23 | Disposition: A | Discharge status: Home / Self Care | Payer: PPO | Source: Ambulatory Visit | Attending: Adult Health | Admitting: Adult Health

## 2018-11-23 ENCOUNTER — Other Ambulatory Visit: Payer: Self-pay | Admitting: Adult Health

## 2018-11-23 ENCOUNTER — Other Ambulatory Visit: Payer: Self-pay

## 2018-11-23 DIAGNOSIS — Z9882 Breast implant status: Secondary | ICD-10-CM | POA: Diagnosis not present

## 2018-11-23 DIAGNOSIS — Z1239 Encounter for other screening for malignant neoplasm of breast: Secondary | ICD-10-CM

## 2018-11-23 DIAGNOSIS — Z978 Presence of other specified devices: Secondary | ICD-10-CM

## 2018-11-23 DIAGNOSIS — T8543XD Leakage of breast prosthesis and implant, subsequent encounter: Secondary | ICD-10-CM

## 2018-11-23 DIAGNOSIS — N6489 Other specified disorders of breast: Secondary | ICD-10-CM | POA: Diagnosis not present

## 2018-11-23 DIAGNOSIS — N644 Mastodynia: Secondary | ICD-10-CM

## 2018-12-01 ENCOUNTER — Other Ambulatory Visit: Payer: Self-pay

## 2018-12-01 ENCOUNTER — Ambulatory Visit
Admission: RE | Admit: 2018-12-01 | Discharge: 2018-12-01 | Disposition: A | Discharge status: Home / Self Care | Payer: PPO | Source: Ambulatory Visit | Attending: Adult Health | Admitting: Adult Health

## 2018-12-01 ENCOUNTER — Other Ambulatory Visit: Payer: Self-pay | Admitting: Adult Health

## 2018-12-01 DIAGNOSIS — R928 Other abnormal and inconclusive findings on diagnostic imaging of breast: Secondary | ICD-10-CM | POA: Diagnosis not present

## 2018-12-01 DIAGNOSIS — N644 Mastodynia: Secondary | ICD-10-CM

## 2018-12-01 DIAGNOSIS — T8543XD Leakage of breast prosthesis and implant, subsequent encounter: Secondary | ICD-10-CM

## 2018-12-01 DIAGNOSIS — Z1239 Encounter for other screening for malignant neoplasm of breast: Secondary | ICD-10-CM

## 2018-12-18 ENCOUNTER — Other Ambulatory Visit: Payer: Self-pay | Admitting: Internal Medicine

## 2018-12-18 DIAGNOSIS — F329 Major depressive disorder, single episode, unspecified: Secondary | ICD-10-CM

## 2018-12-18 DIAGNOSIS — K589 Irritable bowel syndrome without diarrhea: Secondary | ICD-10-CM

## 2018-12-18 DIAGNOSIS — F988 Other specified behavioral and emotional disorders with onset usually occurring in childhood and adolescence: Secondary | ICD-10-CM

## 2018-12-18 DIAGNOSIS — F32A Depression, unspecified: Secondary | ICD-10-CM

## 2018-12-18 DIAGNOSIS — F900 Attention-deficit hyperactivity disorder, predominantly inattentive type: Secondary | ICD-10-CM

## 2018-12-18 MED ORDER — DICYCLOMINE HCL 20 MG PO TABS: ORAL_TABLET | ORAL | 1 refills | Status: DC

## 2018-12-18 MED ORDER — BUPROPION HCL ER (XL) 150 MG PO TB24: ORAL_TABLET | ORAL | 3 refills | Status: DC

## 2018-12-19 ENCOUNTER — Other Ambulatory Visit: Payer: Self-pay | Admitting: Internal Medicine

## 2018-12-19 ENCOUNTER — Other Ambulatory Visit: Payer: Self-pay | Admitting: Adult Health

## 2018-12-19 DIAGNOSIS — F329 Major depressive disorder, single episode, unspecified: Secondary | ICD-10-CM

## 2018-12-19 DIAGNOSIS — F988 Other specified behavioral and emotional disorders with onset usually occurring in childhood and adolescence: Secondary | ICD-10-CM

## 2018-12-19 DIAGNOSIS — F32A Depression, unspecified: Secondary | ICD-10-CM

## 2018-12-19 MED FILL — DICYCLOMINE 20 MG TABLET: 20 | 90 days supply | Qty: 270 | Fill #0

## 2018-12-19 MED FILL — buPROPion HCL ER (XL) 150 M: 150 | 90 days supply | Qty: 270 | Fill #0

## 2018-12-26 DIAGNOSIS — N644 Mastodynia: Secondary | ICD-10-CM | POA: Diagnosis not present

## 2018-12-26 DIAGNOSIS — T8549XA Other mechanical complication of breast prosthesis and implant, initial encounter: Secondary | ICD-10-CM | POA: Diagnosis not present

## 2018-12-30 IMAGING — DX DG ANKLE PORT 2V*L*
3 series · 3 of 3 positions shown · non-contrast
Comparison: Intraoperative LEFT ankle x-rays earlier today. LEFT
ankle CT 08/27/2017. LEFT ankle x-rays 08/19/2017.

CLINICAL DATA: Postop day 0 ORIF trimalleolar LEFT ankle
fracture/dislocation which occurred on 08/19/2017.

EXAM:
PORTABLE LEFT ANKLE - 2 VIEW

[ankle ap]
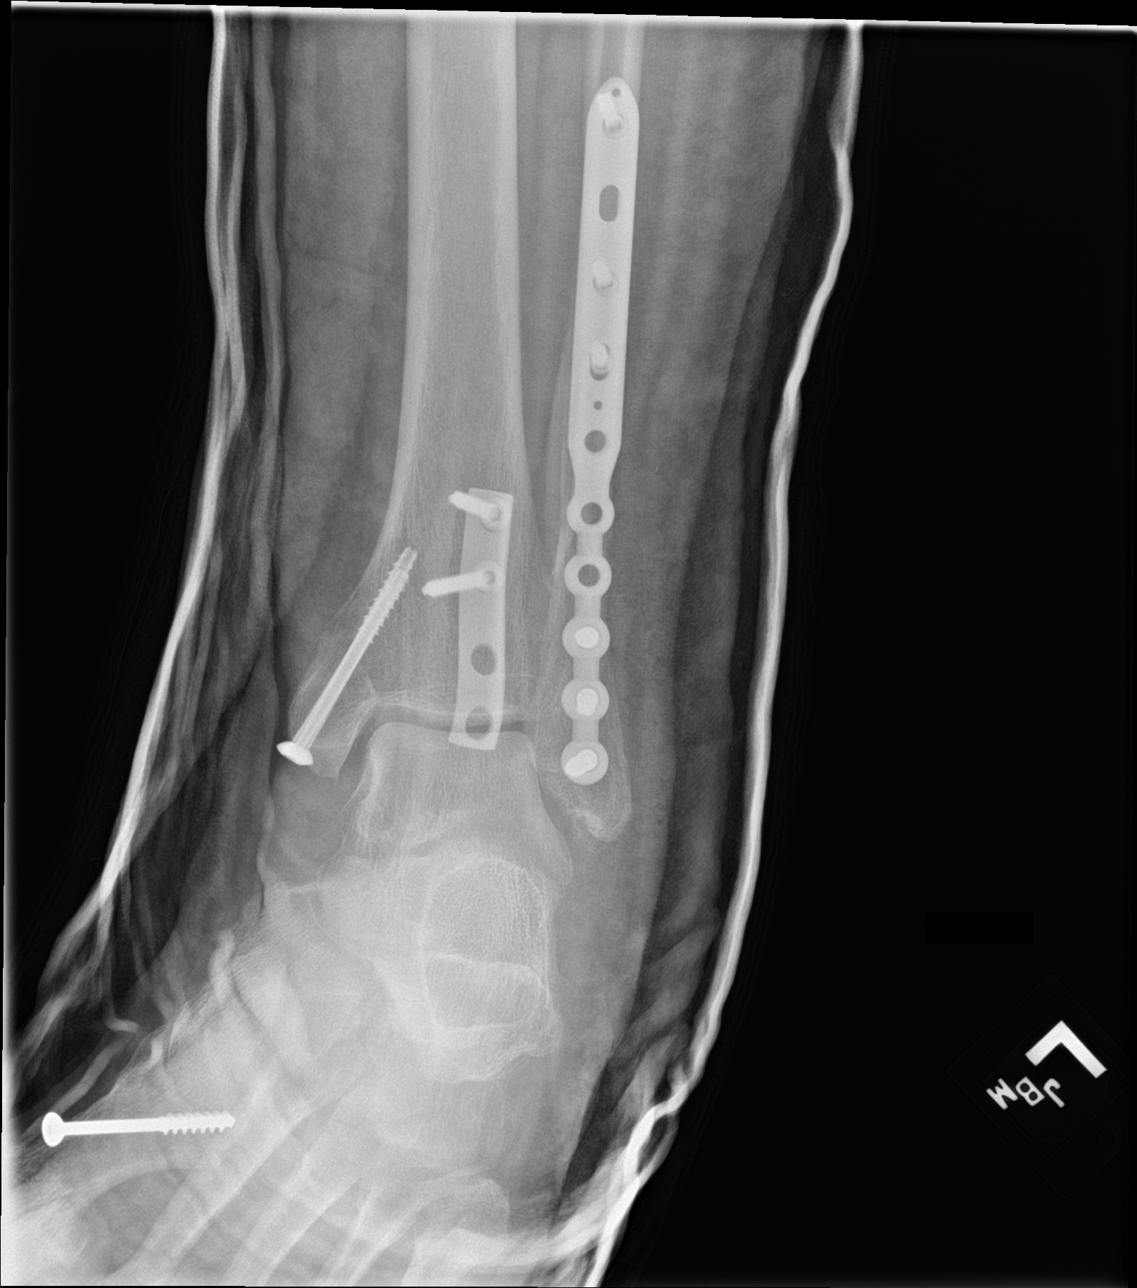

[ankle lat]
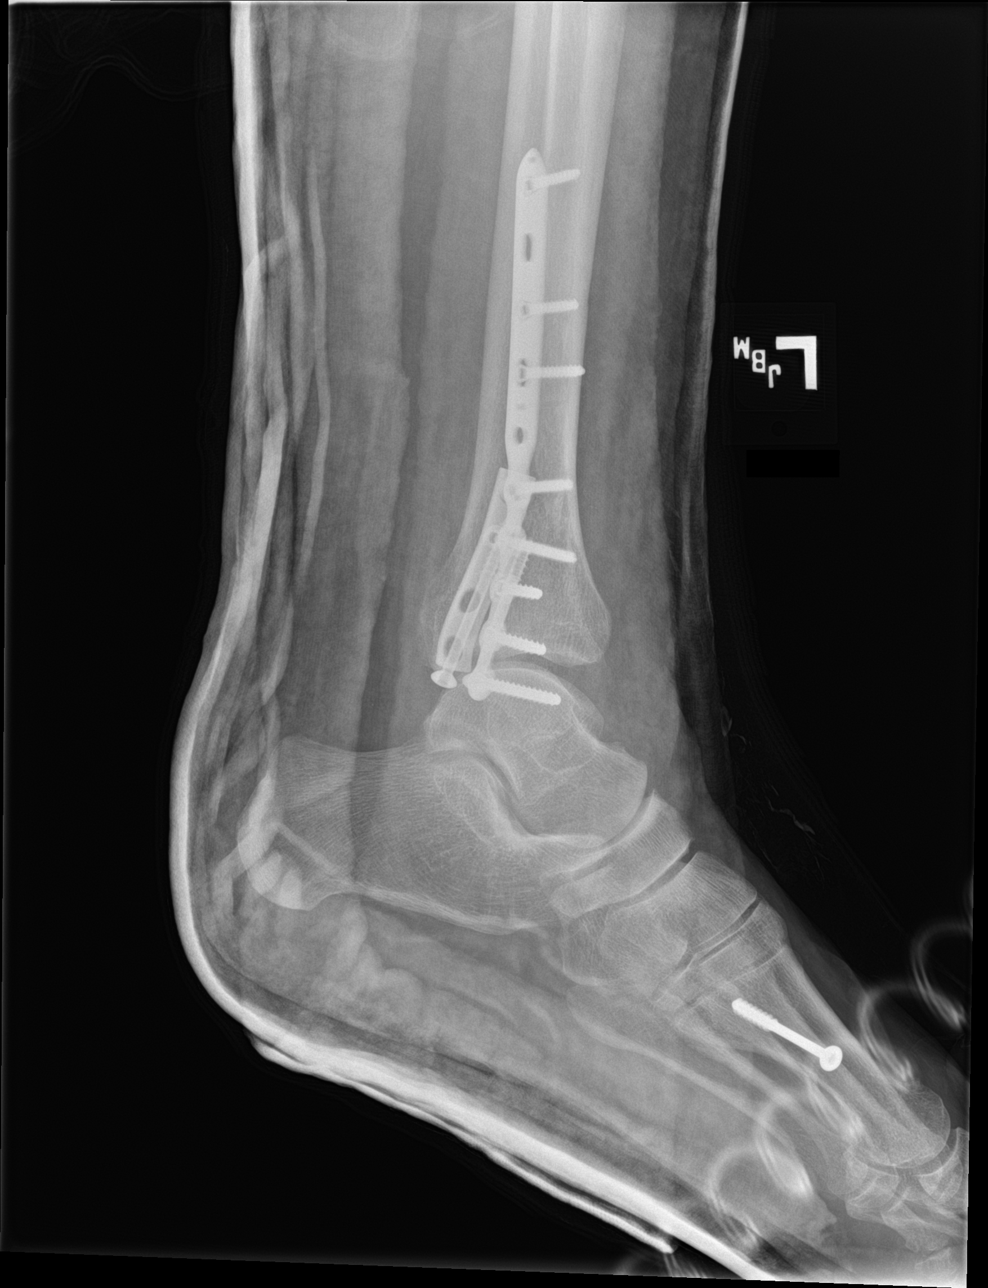

[ankle obl]
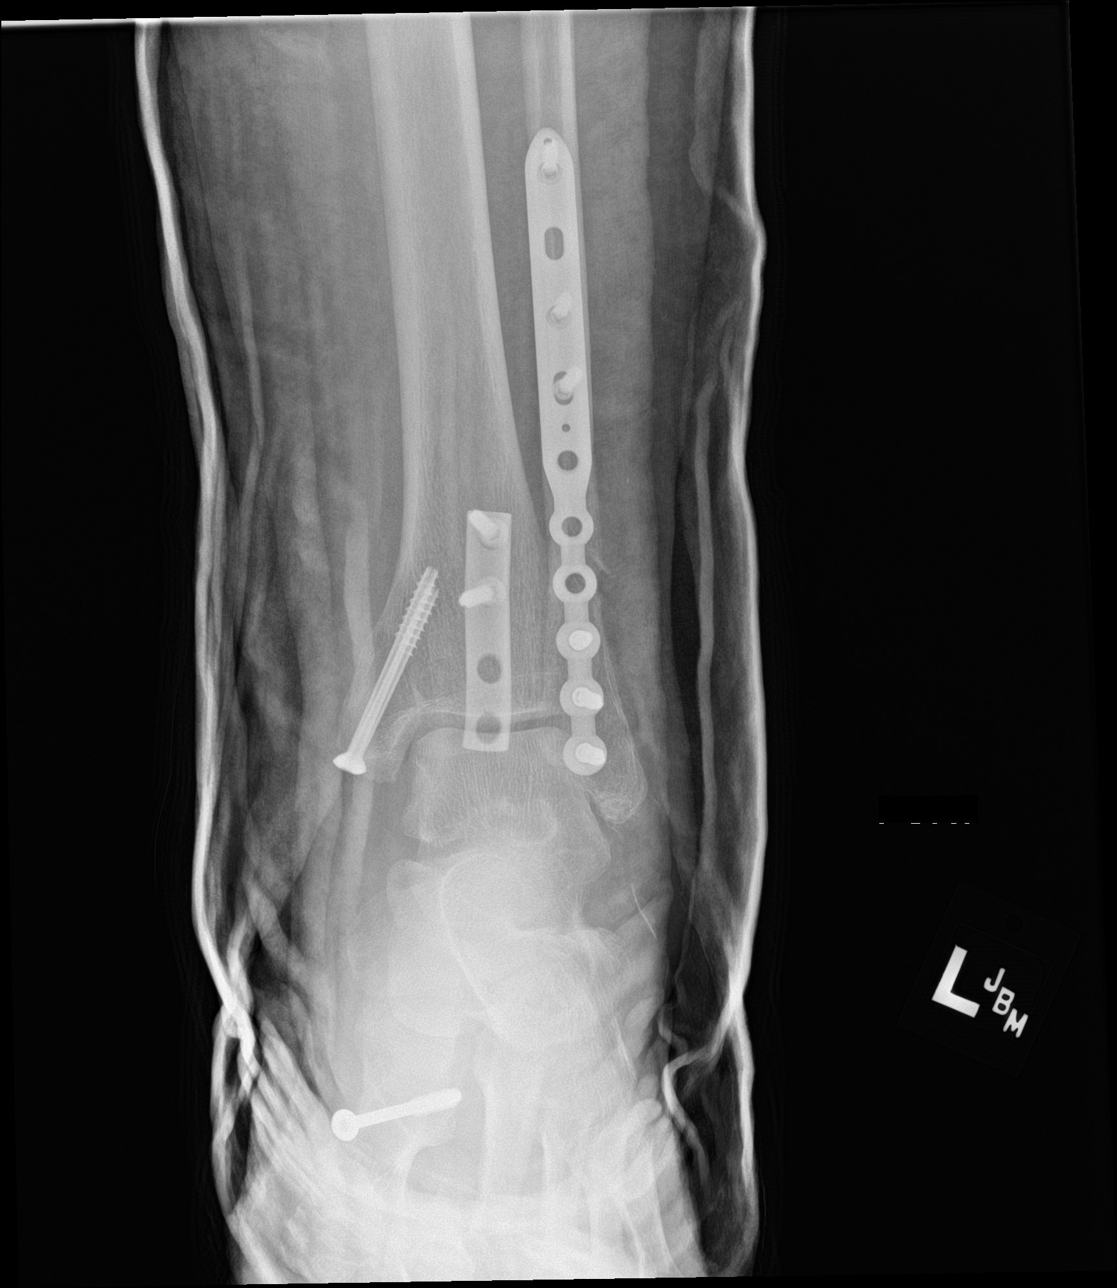

[3 of 3 positions shown; findings below may reference images not displayed]

FINDINGS: Examination is performed in fiberglass splint material. ORIF of the
trimalleolar fracture dislocation with anatomic alignment of the
ankle joint. No acute complicating features.
IMPRESSION: Anatomic alignment post ORIF of the trimalleolar
fracture/dislocation involving the RIGHT ankle. No acute
complicating features.

## 2019-01-03 ENCOUNTER — Ambulatory Visit: Payer: PPO

## 2019-01-16 ENCOUNTER — Encounter: Payer: Self-pay | Admitting: Adult Health

## 2019-01-16 ENCOUNTER — Ambulatory Visit
Admission: RE | Admit: 2019-01-16 | Discharge: 2019-01-16 | Disposition: A | Discharge status: Home / Self Care | Payer: PPO | Source: Ambulatory Visit | Attending: Adult Health | Admitting: Adult Health

## 2019-01-16 ENCOUNTER — Other Ambulatory Visit: Payer: Self-pay

## 2019-01-16 DIAGNOSIS — M85852 Other specified disorders of bone density and structure, left thigh: Secondary | ICD-10-CM | POA: Diagnosis not present

## 2019-01-16 DIAGNOSIS — E2839 Other primary ovarian failure: Secondary | ICD-10-CM

## 2019-01-16 DIAGNOSIS — Z780 Asymptomatic menopausal state: Secondary | ICD-10-CM | POA: Diagnosis not present

## 2019-01-16 DIAGNOSIS — M8580 Other specified disorders of bone density and structure, unspecified site: Secondary | ICD-10-CM | POA: Insufficient documentation

## 2019-01-16 DIAGNOSIS — M85851 Other specified disorders of bone density and structure, right thigh: Secondary | ICD-10-CM | POA: Diagnosis not present

## 2019-01-27 ENCOUNTER — Telehealth: Payer: PPO | Admitting: Physician Assistant

## 2019-01-27 DIAGNOSIS — N3000 Acute cystitis without hematuria: Secondary | ICD-10-CM

## 2019-01-27 MED ORDER — AMOXICILLIN-POT CLAVULANATE 875-125 MG PO TABS: 1.0000 | ORAL_TABLET | Freq: Two times a day (BID) | ORAL | 0 refills | Status: DC

## 2019-01-27 NOTE — Telephone Encounter (Signed)
-----   Message from Elenor Quinones, [place] sent at 01/27/2019 10:37 AM EDT ----- Regarding: POSS UTI Contact: [phone] OFFICE NOTE: patient has checked URINE twice at home with an [place] TEST strips & it was positive for UTI.   Patient would like you call something into her PHARMACY.  [facility]

## 2019-01-27 NOTE — Telephone Encounter (Signed)
HPI: complains of UTI symptoms, has taken home test that is positive.  Onset 5 days ago, progressively worse, has tried water and cranberry juice.  associated with pressure, pain with urination small volume voiding with increased frequency denies hematuria, flank pain or fever The patient has a history of prior UTI recurrent  PMH: reviewed  ROS:  Gen.: No unexpected weight change, no night sweats Lungs: No cough or shortness of breath Cardiovascular: No palpitations or chest pain  PE: General Appearance:Well sounding, in no apparent distress.  ENT/Mouth: No hoarseness, No cough for duration of visit.  Respiratory: completing full sentences without distress, without audible wheeze Neuro: Awake and oriented X 3,  Psych:  Insight and Judgment appropriate.    Lab Results  Component Value Date   WBC 4.7 06/29/2018   HGB 13.7 06/29/2018   HCT 41.5 06/29/2018   PLT 315 06/29/2018   GLUCOSE 88 06/29/2018   CHOL 194 06/29/2018   TRIG 104 06/29/2018   HDL 66 06/29/2018   LDLCALC 108 (H) 06/29/2018   ALT 13 06/29/2018   AST 16 06/29/2018   NA 138 06/29/2018   K 4.6 06/29/2018   CL 102 06/29/2018   CREATININE 1.09 (H) 06/29/2018   BUN 23 06/29/2018   CO2 32 06/29/2018   TSH 3.45 06/29/2018   INR 1.31 06/17/2015   HGBA1C 5.4 03/22/2018   MICROALBUR 0.5 03/22/2018    Assessment/Plan: UTI, classic symptoms with history of same  Empiric antibiotic x7 days Hydration recommended education provided

## 2019-02-09 MED FILL — SERTRALINE HCL 100 MG TAB: 100 | 90 days supply | Qty: 90 | Fill #1

## 2019-02-21 MED FILL — LEVOTHYROXINE 125 MCG TABLE: 125 | 90 days supply | Qty: 90 | Fill #3

## 2019-03-01 DIAGNOSIS — N644 Mastodynia: Secondary | ICD-10-CM | POA: Diagnosis not present

## 2019-03-01 DIAGNOSIS — T8549XD Other mechanical complication of breast prosthesis and implant, subsequent encounter: Secondary | ICD-10-CM | POA: Insufficient documentation

## 2019-03-06 MED FILL — ATENOLOL 100 MG TABLET: 100 | 90 days supply | Qty: 90 | Fill #2

## 2019-03-07 ENCOUNTER — Other Ambulatory Visit: Payer: Self-pay | Admitting: Plastic Surgery

## 2019-03-07 DIAGNOSIS — N644 Mastodynia: Secondary | ICD-10-CM | POA: Diagnosis not present

## 2019-03-07 DIAGNOSIS — T8544XA Capsular contracture of breast implant, initial encounter: Secondary | ICD-10-CM | POA: Diagnosis not present

## 2019-03-07 DIAGNOSIS — N6310 Unspecified lump in the right breast, unspecified quadrant: Secondary | ICD-10-CM | POA: Diagnosis not present

## 2019-03-07 DIAGNOSIS — T8549XD Other mechanical complication of breast prosthesis and implant, subsequent encounter: Secondary | ICD-10-CM | POA: Diagnosis not present

## 2019-03-07 DIAGNOSIS — T8579XA Infection and inflammatory reaction due to other internal prosthetic devices, implants and grafts, initial encounter: Secondary | ICD-10-CM | POA: Diagnosis not present

## 2019-03-07 HISTORY — PX: BREAST IMPLANT REMOVAL: SUR1101

## 2019-03-09 ENCOUNTER — Encounter (HOSPITAL_COMMUNITY): Payer: Self-pay | Admitting: Emergency Medicine

## 2019-03-09 ENCOUNTER — Inpatient Hospital Stay (HOSPITAL_COMMUNITY)
Admission: EM | Admit: 2019-03-09 | Discharge: 2019-03-14 | DRG: 871 | Disposition: A | Discharge status: Home / Self Care | Payer: PPO | Attending: Internal Medicine | Admitting: Internal Medicine

## 2019-03-09 ENCOUNTER — Emergency Department (HOSPITAL_COMMUNITY): Payer: PPO

## 2019-03-09 ENCOUNTER — Other Ambulatory Visit: Payer: Self-pay

## 2019-03-09 DIAGNOSIS — J439 Emphysema, unspecified: Secondary | ICD-10-CM | POA: Diagnosis present

## 2019-03-09 DIAGNOSIS — R6520 Severe sepsis without septic shock: Secondary | ICD-10-CM | POA: Diagnosis present

## 2019-03-09 DIAGNOSIS — N1831 Chronic kidney disease, stage 3a: Secondary | ICD-10-CM | POA: Diagnosis present

## 2019-03-09 DIAGNOSIS — E872 Acidosis: Secondary | ICD-10-CM | POA: Diagnosis not present

## 2019-03-09 DIAGNOSIS — Z87891 Personal history of nicotine dependence: Secondary | ICD-10-CM

## 2019-03-09 DIAGNOSIS — G43909 Migraine, unspecified, not intractable, without status migrainosus: Secondary | ICD-10-CM | POA: Diagnosis present

## 2019-03-09 DIAGNOSIS — Z9889 Other specified postprocedural states: Secondary | ICD-10-CM

## 2019-03-09 DIAGNOSIS — E876 Hypokalemia: Secondary | ICD-10-CM | POA: Diagnosis not present

## 2019-03-09 DIAGNOSIS — J438 Other emphysema: Secondary | ICD-10-CM | POA: Diagnosis not present

## 2019-03-09 DIAGNOSIS — A419 Sepsis, unspecified organism: Principal | ICD-10-CM | POA: Diagnosis present

## 2019-03-09 DIAGNOSIS — K2210 Ulcer of esophagus without bleeding: Secondary | ICD-10-CM | POA: Diagnosis not present

## 2019-03-09 DIAGNOSIS — D509 Iron deficiency anemia, unspecified: Secondary | ICD-10-CM | POA: Diagnosis present

## 2019-03-09 DIAGNOSIS — K209 Esophagitis, unspecified without bleeding: Secondary | ICD-10-CM | POA: Diagnosis not present

## 2019-03-09 DIAGNOSIS — J9601 Acute respiratory failure with hypoxia: Secondary | ICD-10-CM | POA: Diagnosis not present

## 2019-03-09 DIAGNOSIS — Z882 Allergy status to sulfonamides status: Secondary | ICD-10-CM

## 2019-03-09 DIAGNOSIS — R1310 Dysphagia, unspecified: Secondary | ICD-10-CM | POA: Diagnosis present

## 2019-03-09 DIAGNOSIS — I447 Left bundle-branch block, unspecified: Secondary | ICD-10-CM | POA: Diagnosis not present

## 2019-03-09 DIAGNOSIS — N179 Acute kidney failure, unspecified: Secondary | ICD-10-CM | POA: Diagnosis present

## 2019-03-09 DIAGNOSIS — F909 Attention-deficit hyperactivity disorder, unspecified type: Secondary | ICD-10-CM | POA: Diagnosis present

## 2019-03-09 DIAGNOSIS — R0602 Shortness of breath: Secondary | ICD-10-CM | POA: Diagnosis not present

## 2019-03-09 DIAGNOSIS — J449 Chronic obstructive pulmonary disease, unspecified: Secondary | ICD-10-CM | POA: Diagnosis not present

## 2019-03-09 DIAGNOSIS — E039 Hypothyroidism, unspecified: Secondary | ICD-10-CM

## 2019-03-09 DIAGNOSIS — Z20828 Contact with and (suspected) exposure to other viral communicable diseases: Secondary | ICD-10-CM | POA: Diagnosis present

## 2019-03-09 DIAGNOSIS — I10 Essential (primary) hypertension: Secondary | ICD-10-CM | POA: Diagnosis not present

## 2019-03-09 DIAGNOSIS — K589 Irritable bowel syndrome without diarrhea: Secondary | ICD-10-CM | POA: Diagnosis present

## 2019-03-09 DIAGNOSIS — I5031 Acute diastolic (congestive) heart failure: Secondary | ICD-10-CM | POA: Diagnosis not present

## 2019-03-09 DIAGNOSIS — F329 Major depressive disorder, single episode, unspecified: Secondary | ICD-10-CM | POA: Diagnosis present

## 2019-03-09 DIAGNOSIS — J432 Centrilobular emphysema: Secondary | ICD-10-CM | POA: Diagnosis not present

## 2019-03-09 DIAGNOSIS — R7401 Elevation of levels of liver transaminase levels: Secondary | ICD-10-CM | POA: Diagnosis present

## 2019-03-09 DIAGNOSIS — Z9981 Dependence on supplemental oxygen: Secondary | ICD-10-CM

## 2019-03-09 DIAGNOSIS — R7303 Prediabetes: Secondary | ICD-10-CM | POA: Diagnosis present

## 2019-03-09 DIAGNOSIS — Z885 Allergy status to narcotic agent status: Secondary | ICD-10-CM

## 2019-03-09 DIAGNOSIS — K228 Other specified diseases of esophagus: Secondary | ICD-10-CM | POA: Diagnosis not present

## 2019-03-09 DIAGNOSIS — Z79899 Other long term (current) drug therapy: Secondary | ICD-10-CM

## 2019-03-09 DIAGNOSIS — I5032 Chronic diastolic (congestive) heart failure: Secondary | ICD-10-CM | POA: Diagnosis present

## 2019-03-09 DIAGNOSIS — J189 Pneumonia, unspecified organism: Secondary | ICD-10-CM

## 2019-03-09 DIAGNOSIS — Z888 Allergy status to other drugs, medicaments and biological substances status: Secondary | ICD-10-CM

## 2019-03-09 DIAGNOSIS — F429 Obsessive-compulsive disorder, unspecified: Secondary | ICD-10-CM | POA: Diagnosis present

## 2019-03-09 DIAGNOSIS — J392 Other diseases of pharynx: Secondary | ICD-10-CM | POA: Diagnosis present

## 2019-03-09 DIAGNOSIS — Z9886 Personal history of breast implant removal: Secondary | ICD-10-CM | POA: Diagnosis not present

## 2019-03-09 DIAGNOSIS — R221 Localized swelling, mass and lump, neck: Secondary | ICD-10-CM | POA: Diagnosis present

## 2019-03-09 DIAGNOSIS — J029 Acute pharyngitis, unspecified: Secondary | ICD-10-CM | POA: Diagnosis not present

## 2019-03-09 DIAGNOSIS — G4730 Sleep apnea, unspecified: Secondary | ICD-10-CM | POA: Diagnosis present

## 2019-03-09 DIAGNOSIS — E782 Mixed hyperlipidemia: Secondary | ICD-10-CM | POA: Diagnosis not present

## 2019-03-09 DIAGNOSIS — K219 Gastro-esophageal reflux disease without esophagitis: Secondary | ICD-10-CM | POA: Diagnosis not present

## 2019-03-09 DIAGNOSIS — Z79890 Hormone replacement therapy (postmenopausal): Secondary | ICD-10-CM

## 2019-03-09 DIAGNOSIS — R509 Fever, unspecified: Secondary | ICD-10-CM | POA: Diagnosis not present

## 2019-03-09 DIAGNOSIS — R778 Other specified abnormalities of plasma proteins: Secondary | ICD-10-CM | POA: Diagnosis not present

## 2019-03-09 DIAGNOSIS — Z8371 Family history of colonic polyps: Secondary | ICD-10-CM

## 2019-03-09 DIAGNOSIS — I130 Hypertensive heart and chronic kidney disease with heart failure and stage 1 through stage 4 chronic kidney disease, or unspecified chronic kidney disease: Secondary | ICD-10-CM | POA: Diagnosis present

## 2019-03-09 DIAGNOSIS — T17890A Other foreign object in other parts of respiratory tract causing asphyxiation, initial encounter: Secondary | ICD-10-CM | POA: Diagnosis not present

## 2019-03-09 DIAGNOSIS — F419 Anxiety disorder, unspecified: Secondary | ICD-10-CM | POA: Diagnosis present

## 2019-03-09 DIAGNOSIS — Z91040 Latex allergy status: Secondary | ICD-10-CM

## 2019-03-09 DIAGNOSIS — F39 Unspecified mood [affective] disorder: Secondary | ICD-10-CM | POA: Diagnosis present

## 2019-03-09 DIAGNOSIS — I361 Nonrheumatic tricuspid (valve) insufficiency: Secondary | ICD-10-CM | POA: Diagnosis not present

## 2019-03-09 DIAGNOSIS — I248 Other forms of acute ischemic heart disease: Secondary | ICD-10-CM | POA: Diagnosis not present

## 2019-03-09 DIAGNOSIS — Z7982 Long term (current) use of aspirin: Secondary | ICD-10-CM

## 2019-03-09 HISTORY — DX: Acute kidney failure, unspecified: N17.9

## 2019-03-09 LAB — POCT I-STAT 7, (LYTES, BLD GAS, ICA,H+H)
Acid-base deficit: 1 mmol/L (ref 0.0–2.0)
Bicarbonate: 26.9 mmol/L (ref 20.0–28.0)
Calcium, Ion: 1.34 mmol/L (ref 1.15–1.40)
HCT: 34 % — ABNORMAL LOW (ref 36.0–46.0)
Hemoglobin: 11.6 g/dL — ABNORMAL LOW (ref 12.0–15.0)
O2 Saturation: 95 %
Patient temperature: 98.6
Potassium: 4.8 mmol/L (ref 3.5–5.1)
Sodium: 137 mmol/L (ref 135–145)
TCO2: 29 mmol/L (ref 22–32)
pCO2 arterial: 56.3 mmHg — ABNORMAL HIGH (ref 32.0–48.0)
pH, Arterial: 7.287 — ABNORMAL LOW (ref 7.350–7.450)
pO2, Arterial: 87 mmHg (ref 83.0–108.0)

## 2019-03-09 LAB — CBC WITH DIFFERENTIAL/PLATELET
Abs Immature Granulocytes: 0.1 10*3/uL — ABNORMAL HIGH (ref 0.00–0.07)
Basophils Absolute: 0 10*3/uL (ref 0.0–0.1)
Basophils Relative: 1 %
Eosinophils Absolute: 0.1 10*3/uL (ref 0.0–0.5)
Eosinophils Relative: 1 %
HCT: 39.4 % (ref 36.0–46.0)
Hemoglobin: 12.4 g/dL (ref 12.0–15.0)
Immature Granulocytes: 2 %
Lymphocytes Relative: 15 %
Lymphs Abs: 1 10*3/uL (ref 0.7–4.0)
MCH: 32.3 pg (ref 26.0–34.0)
MCHC: 31.5 g/dL (ref 30.0–36.0)
MCV: 102.6 fL — ABNORMAL HIGH (ref 80.0–100.0)
Monocytes Absolute: 0.5 10*3/uL (ref 0.1–1.0)
Monocytes Relative: 8 %
Neutro Abs: 4.7 10*3/uL (ref 1.7–7.7)
Neutrophils Relative %: 73 %
Platelets: 288 10*3/uL (ref 150–400)
RBC: 3.84 MIL/uL — ABNORMAL LOW (ref 3.87–5.11)
RDW: 13.1 % (ref 11.5–15.5)
WBC: 6.3 10*3/uL (ref 4.0–10.5)
nRBC: 0 % (ref 0.0–0.2)

## 2019-03-09 LAB — COMPREHENSIVE METABOLIC PANEL
ALT: 184 U/L — ABNORMAL HIGH (ref 0–44)
AST: 305 U/L — ABNORMAL HIGH (ref 15–41)
Albumin: 3.1 g/dL — ABNORMAL LOW (ref 3.5–5.0)
Alkaline Phosphatase: 105 U/L (ref 38–126)
Anion gap: 10 (ref 5–15)
BUN: 20 mg/dL (ref 8–23)
CO2: 26 mmol/L (ref 22–32)
Calcium: 9.4 mg/dL (ref 8.9–10.3)
Chloride: 100 mmol/L (ref 98–111)
Creatinine, Ser: 2.03 mg/dL — ABNORMAL HIGH (ref 0.44–1.00)
GFR calc Af Amer: 29 mL/min — ABNORMAL LOW (ref >60)
GFR calc non Af Amer: 25 mL/min — ABNORMAL LOW (ref >60)
Glucose, Bld: 146 mg/dL — ABNORMAL HIGH (ref 70–99)
Potassium: 5.3 mmol/L — ABNORMAL HIGH (ref 3.5–5.1)
Sodium: 136 mmol/L (ref 135–145)
Total Bilirubin: 0.8 mg/dL (ref 0.3–1.2)
Total Protein: 5.7 g/dL — ABNORMAL LOW (ref 6.5–8.1)

## 2019-03-09 LAB — SARS CORONAVIRUS 2 (TAT 6-24 HRS): SARS Coronavirus 2: NEGATIVE

## 2019-03-09 LAB — LACTIC ACID, PLASMA
Lactic Acid, Venous: 2.2 mmol/L (ref 0.5–1.9)
Lactic Acid, Venous: 1.3 mmol/L (ref 0.5–1.9)

## 2019-03-09 LAB — TROPONIN I (HIGH SENSITIVITY)
Troponin I (High Sensitivity): 622 ng/L (ref <18)
Troponin I (High Sensitivity): 808 ng/L (ref <18)
Troponin I (High Sensitivity): 709 ng/L (ref <18)
Troponin I (High Sensitivity): 608 ng/L (ref <18)

## 2019-03-09 LAB — BRAIN NATRIURETIC PEPTIDE: B Natriuretic Peptide: 418.1 pg/mL — ABNORMAL HIGH (ref 0.0–100.0)

## 2019-03-09 MED ORDER — VANCOMYCIN HCL IN DEXTROSE 1-5 GM/200ML-% IV SOLN: 1000.0000 mg | Freq: Once | INTRAVENOUS | Status: DC

## 2019-03-09 MED ORDER — ACETAMINOPHEN 650 MG RE SUPP: 650.0000 mg | Freq: Four times a day (QID) | RECTAL | Status: DC | PRN

## 2019-03-09 MED ORDER — SODIUM CHLORIDE 0.9 % IV SOLN
INTRAVENOUS | Status: DC
  Administered 2019-03-09 – 2019-03-11 (×2): via INTRAVENOUS

## 2019-03-09 MED ORDER — ONDANSETRON HCL 4 MG/2ML IJ SOLN: 4.0000 mg | Freq: Four times a day (QID) | INTRAMUSCULAR | Status: DC | PRN

## 2019-03-09 MED ORDER — PHENOL 1.4 % MT LIQD
1.0000 | OROMUCOSAL | Status: DC | PRN
  Filled 2019-03-09: qty 177

## 2019-03-09 MED ORDER — LEVOTHYROXINE SODIUM 25 MCG PO TABS: 125.0000 ug | ORAL_TABLET | Freq: Every day | ORAL | Status: DC

## 2019-03-09 MED ORDER — SERTRALINE HCL 100 MG PO TABS
100.0000 mg | ORAL_TABLET | Freq: Every day | ORAL | Status: DC
  Administered 2019-03-09 – 2019-03-14 (×4): 100 mg via ORAL
  Filled 2019-03-09 (×4): qty 1

## 2019-03-09 MED ORDER — ACETAMINOPHEN 325 MG PO TABS
650.0000 mg | ORAL_TABLET | Freq: Once | ORAL | Status: DC
  Filled 2019-03-09: qty 2

## 2019-03-09 MED ORDER — SODIUM CHLORIDE 0.9 % IV SOLN
2.0000 g | Freq: Once | INTRAVENOUS | Status: AC
  Administered 2019-03-09: 2 g via INTRAVENOUS
  Filled 2019-03-09: qty 2

## 2019-03-09 MED ORDER — ONDANSETRON HCL 4 MG PO TABS: 4.0000 mg | ORAL_TABLET | Freq: Four times a day (QID) | ORAL | Status: DC | PRN

## 2019-03-09 MED ORDER — HYOSCYAMINE SULFATE ER 0.375 MG PO TB12
0.3750 mg | ORAL_TABLET | Freq: Two times a day (BID) | ORAL | Status: DC
  Administered 2019-03-12: 0.375 mg via ORAL
  Filled 2019-03-09 (×7): qty 1

## 2019-03-09 MED ORDER — VANCOMYCIN HCL 500 MG IV SOLR: 500.0000 mg | INTRAVENOUS | Status: DC

## 2019-03-09 MED ORDER — LEVOFLOXACIN 500 MG PO TABS
500.0000 mg | ORAL_TABLET | ORAL | Status: DC
  Filled 2019-03-09 (×2): qty 1

## 2019-03-09 MED ORDER — FAMOTIDINE 20 MG PO TABS
20.0000 mg | ORAL_TABLET | Freq: Every day | ORAL | Status: DC
  Administered 2019-03-09 – 2019-03-12 (×2): 20 mg via ORAL
  Filled 2019-03-09 (×2): qty 1

## 2019-03-09 MED ORDER — ENOXAPARIN SODIUM 40 MG/0.4ML ~~LOC~~ SOLN
40.0000 mg | SUBCUTANEOUS | Status: DC
  Administered 2019-03-09 – 2019-03-13 (×5): 40 mg via SUBCUTANEOUS
  Filled 2019-03-09 (×5): qty 0.4

## 2019-03-09 MED ORDER — GUAIFENESIN ER 600 MG PO TB12
1200.0000 mg | ORAL_TABLET | Freq: Two times a day (BID) | ORAL | Status: DC
  Administered 2019-03-12: 1200 mg via ORAL
  Filled 2019-03-09 (×5): qty 2

## 2019-03-09 MED ORDER — ASPIRIN 81 MG PO CHEW: 324.0000 mg | CHEWABLE_TABLET | Freq: Once | ORAL | Status: DC

## 2019-03-09 MED ORDER — OXYCODONE HCL 5 MG PO TABS: 5.0000 mg | ORAL_TABLET | ORAL | Status: DC | PRN

## 2019-03-09 MED ORDER — LEVOFLOXACIN IN D5W 500 MG/100ML IV SOLN
500.0000 mg | INTRAVENOUS | Status: DC
  Administered 2019-03-09: 500 mg via INTRAVENOUS
  Filled 2019-03-09: qty 100

## 2019-03-09 MED ORDER — SODIUM CHLORIDE 0.9 % IV SOLN
1.0000 g | Freq: Three times a day (TID) | INTRAVENOUS | Status: DC
  Filled 2019-03-09: qty 1

## 2019-03-09 MED ORDER — ACETAMINOPHEN 325 MG PO TABS: 650.0000 mg | ORAL_TABLET | Freq: Four times a day (QID) | ORAL | Status: DC | PRN

## 2019-03-09 MED ORDER — ALBUTEROL SULFATE HFA 108 (90 BASE) MCG/ACT IN AERS
2.0000 | INHALATION_SPRAY | Freq: Four times a day (QID) | RESPIRATORY_TRACT | Status: DC | PRN
  Filled 2019-03-09: qty 6.7

## 2019-03-09 MED ORDER — DEXAMETHASONE SODIUM PHOSPHATE 10 MG/ML IJ SOLN
10.0000 mg | Freq: Once | INTRAMUSCULAR | Status: AC
  Administered 2019-03-09: 10 mg via INTRAVENOUS
  Filled 2019-03-09: qty 1

## 2019-03-09 MED ORDER — ASPIRIN 300 MG RE SUPP
300.0000 mg | Freq: Once | RECTAL | Status: AC
  Administered 2019-03-09: 300 mg via RECTAL
  Filled 2019-03-09: qty 1

## 2019-03-09 MED ORDER — ATENOLOL 25 MG PO TABS
100.0000 mg | ORAL_TABLET | Freq: Every day | ORAL | Status: DC
  Administered 2019-03-09 – 2019-03-14 (×4): 100 mg via ORAL
  Filled 2019-03-09 (×4): qty 4

## 2019-03-09 MED ORDER — ASPIRIN EC 81 MG PO TBEC
81.0000 mg | DELAYED_RELEASE_TABLET | Freq: Every day | ORAL | Status: DC
  Administered 2019-03-12 – 2019-03-14 (×3): 81 mg via ORAL
  Filled 2019-03-09 (×3): qty 1

## 2019-03-09 MED ORDER — VANCOMYCIN HCL 10 G IV SOLR
1500.0000 mg | Freq: Once | INTRAVENOUS | Status: AC
  Administered 2019-03-09: 1500 mg via INTRAVENOUS
  Filled 2019-03-09: qty 1500

## 2019-03-09 MED ORDER — ALPRAZOLAM 0.5 MG PO TABS
0.5000 mg | ORAL_TABLET | Freq: Three times a day (TID) | ORAL | Status: DC | PRN
  Administered 2019-03-09: 0.5 mg via ORAL
  Filled 2019-03-09: qty 1

## 2019-03-09 MED ORDER — MIRABEGRON ER 25 MG PO TB24
25.0000 mg | ORAL_TABLET | Freq: Every day | ORAL | Status: DC
  Filled 2019-03-09 (×3): qty 1

## 2019-03-09 MED ORDER — SODIUM CHLORIDE 0.9 % IV BOLUS
1000.0000 mL | Freq: Once | INTRAVENOUS | Status: AC
  Administered 2019-03-09: 1000 mL via INTRAVENOUS

## 2019-03-09 MED ORDER — ACETAMINOPHEN 650 MG RE SUPP
650.0000 mg | Freq: Once | RECTAL | Status: AC
  Administered 2019-03-09: 650 mg via RECTAL
  Filled 2019-03-09: qty 1

## 2019-03-09 MED ORDER — BUPROPION HCL ER (XL) 300 MG PO TB24
450.0000 mg | ORAL_TABLET | Freq: Every day | ORAL | Status: DC
  Administered 2019-03-12 – 2019-03-14 (×3): 450 mg via ORAL
  Filled 2019-03-09 (×4): qty 1

## 2019-03-09 NOTE — ED Notes (Signed)
ED TO INPATIENT HANDOFF REPORT  ED Nurse Name and Phone #:  Marya Amsler [phone]  S Name/Age/Gender [name] 68 y.o. female Room/Bed: 036C/036C  Code Status   Code Status: Prior  Home/SNF/Other Home Patient oriented to: self, place, time and situation Is this baseline? Yes   Triage Complete: Triage complete  Chief Complaint vomitting   Triage Note Patient presents with granddaughter stating patient had surgery Tuesday to have breast implants removed. Last night started having vomiting and swelling to face. C/o pain everywhere.    Allergies Allergies  Allergen Reactions  . Ace Inhibitors Other (See Comments)    Acute renal failure (lisinopril)  . Cephalexin Anaphylaxis  . Macrobid [Nitrofurantoin] Other (See Comments)    Causes hyperthermia   . Codeine Nausea Only  . Latex Rash  . Sulfa Antibiotics Other (See Comments)    Do not take per nephrology    Level of Care/Admitting Diagnosis ED Disposition    ED Disposition Condition [place]: [place] [id number]  Level of Care: Telemetry Medical [id number]  Covid Evaluation: Confirmed COVID Negative  Diagnosis: Pneumonia due to infectious agent [id number]  Admitting Physician: Barb Merino [id number]  Attending Physician: Barb Merino [id number]  Estimated length of stay: past midnight tomorrow  Certification:: I certify this patient will need inpatient services for at least 2 midnights  PT Class (Do Not Modify): Inpatient [101]  PT Acc Code (Do Not Modify): Private [1]       B Medical/Surgery History Past Medical History:  Diagnosis Date  . Chronic kidney disease (CKD), stage III (moderate)   . Diverticulosis   . Emphysema lung ([place])   . Helicobacter pylori (H. pylori)   . History of IBS   . IBS (irritable bowel syndrome)   . LBBB (left bundle branch block)   . Lower urinary tract infection 06/21/2017  . Migraines   . OCD (obsessive compulsive disorder)   . Prediabetes     pcp  . Renal disorder    AKI due to lisinpril, his. pyelonephritis,   . Sepsis ([place])   . Sleep apnea    no CPAP  . Vitamin D deficiency    Past Surgical History:  Procedure Laterality Date  . AUGMENTATION MAMMAPLASTY    . BREAST ENHANCEMENT SURGERY    . BUNIONECTOMY     Bil  . COLON SURGERY     colonoscopy  . HEMORRHOID SURGERY    . OOPHORECTOMY    . ORIF ANKLE FRACTURE Left 08/30/2017   Procedure: OPEN REDUCTION INTERNAL FIXATION (ORIF) ANKLE FRACTURE;  Surgeon: Rod Can, MD;  Location: [facility];  Service: Orthopedics;  Laterality: Left;  . TONSILLECTOMY       A IV Location/Drains/Wounds Patient Lines/Drains/Airways Status   Active Line/Drains/Airways    Name:   Placement date:   Placement time:   Site:   Days:   Peripheral IV 03/09/19 Left Antecubital   03/09/19    [id number]    Antecubital   less than 1   Incision (Closed) 08/30/17 Ankle Left   08/30/17    1652     556          Intake/Output Last 24 hours  Intake/Output Summary (Last 24 hours) at 03/09/2019 1657 Last data filed at 03/09/2019 1550 Gross per 24 hour  Intake 1100 ml  Output -  Net 1100 ml    Labs/Imaging Results for orders placed or performed during the hospital encounter of 03/09/19 (from the past 48 hour(s))  Lactic acid, plasma     Status: Abnormal   Collection Time: 03/09/19  8:24 AM  Result Value Ref Range   Lactic Acid, Venous 2.2 (HH) 0.5 - 1.9 mmol/L    Comment: CRITICAL RESULT CALLED TO, READ BACK BY AND VERIFIED WITH: ANNA JASPER,RN AT 0902 03/09/2019 BY ZBEECH. Performed at [facility], [place] [address], [place], [place] [address]   Comprehensive metabolic panel     Status: Abnormal   Collection Time: 03/09/19  8:24 AM  Result Value Ref Range   Sodium 136 135 - 145 mmol/L   Potassium 5.3 (H) 3.5 - 5.1 mmol/L   Chloride 100 98 - 111 mmol/L   CO2 26 22 - 32 mmol/L   Glucose, Bld 146 (H) 70 - 99 mg/dL   BUN 20 8 - 23 mg/dL   Creatinine, Ser 2.03 (H) 0.44 - 1.00 mg/dL    Calcium 9.4 8.9 - 10.3 mg/dL   Total Protein 5.7 (L) 6.5 - 8.1 g/dL   Albumin 3.1 (L) 3.5 - 5.0 g/dL   AST 305 (H) 15 - 41 U/L   ALT 184 (H) 0 - 44 U/L   Alkaline Phosphatase 105 38 - 126 U/L   Total Bilirubin 0.8 0.3 - 1.2 mg/dL   GFR calc non Af Amer 25 (L) >60 mL/min   GFR calc Af Amer 29 (L) >60 mL/min   Anion gap 10 5 - 15    Comment: Performed at [facility], [place] [address], [place], [place] [address]  CBC with Differential     Status: Abnormal   Collection Time: 03/09/19  8:24 AM  Result Value Ref Range   WBC 6.3 4.0 - 10.5 K/uL   RBC 3.84 (L) 3.87 - 5.11 MIL/uL   Hemoglobin 12.4 12.0 - 15.0 g/dL   HCT 39.4 36.0 - 46.0 %   MCV 102.6 (H) 80.0 - 100.0 fL   MCH 32.3 26.0 - 34.0 pg   MCHC 31.5 30.0 - 36.0 g/dL   RDW 13.1 11.5 - 15.5 %   Platelets 288 150 - 400 K/uL   nRBC 0.0 0.0 - 0.2 %   Neutrophils Relative % 73 %   Neutro Abs 4.7 1.7 - 7.7 K/uL   Lymphocytes Relative 15 %   Lymphs Abs 1.0 0.7 - 4.0 K/uL   Monocytes Relative 8 %   Monocytes Absolute 0.5 0.1 - 1.0 K/uL   Eosinophils Relative 1 %   Eosinophils Absolute 0.1 0.0 - 0.5 K/uL   Basophils Relative 1 %   Basophils Absolute 0.0 0.0 - 0.1 K/uL   Immature Granulocytes 2 %   Abs Immature Granulocytes 0.10 (H) 0.00 - 0.07 K/uL    Comment: Performed at [facility] [address] [place], [place] [address]  Brain natriuretic peptide     Status: Abnormal   Collection Time: 03/09/19  8:24 AM  Result Value Ref Range   B Natriuretic Peptide 418.1 (H) 0.0 - 100.0 pg/mL    Comment: Performed at [facility] [address], [place], [place] [address]  Troponin I (High Sensitivity)     Status: Abnormal   Collection Time: 03/09/19  9:01 AM  Result Value Ref Range   Troponin I (High Sensitivity) 622 (HH) <18 ng/L    Comment: CRITICAL RESULT CALLED TO, READ BACK BY AND VERIFIED WITH: ANNA JASPER,RN AT [id number] 03/09/2019 BY ZBEECH. (NOTE) Elevated high sensitivity troponin I (hsTnI) values and  significant  changes across serial measurements may  suggest ACS but many other  chronic and acute conditions are known to elevate hsTnI results.  Refer to the Links section for chest pain algorithms and additional  guidance. Performed at [facility], [place] [address], [place], [place] [address]   SARS CORONAVIRUS 2 (TAT 6-24 HRS) Nasopharyngeal Nasopharyngeal Swab     Status: None   Collection Time: 03/09/19  9:03 AM   Specimen: Nasopharyngeal Swab  Result Value Ref Range   SARS Coronavirus 2 NEGATIVE NEGATIVE    Comment: (NOTE) SARS-CoV-2 target nucleic acids are NOT DETECTED. The SARS-CoV-2 RNA is generally detectable in upper and lower respiratory specimens during the acute phase of infection. Negative results do not preclude SARS-CoV-2 infection, do not rule out co-infections with other pathogens, and should not be used as the sole basis for treatment or other patient management decisions. Negative results must be combined with clinical observations, patient history, and epidemiological information. The expected result is Negative. Fact Sheet for Patients: SugarRoll.be Fact Sheet for Healthcare Providers: https://www.woods-mathews.com/ This test is not yet approved or cleared by the [place] FDA and  has been authorized for detection and/or diagnosis of SARS-CoV-2 by FDA under an Emergency Use Authorization (EUA). This EUA will remain  in effect (meaning this test can be used) for the duration of the COVID-19 declaration under Section 56 4(b)(1) of the Act, 21 U.S.C. section 360bbb-3(b)(1), unless the authorization is terminated or revoked sooner. Performed at [facility], [place] [address], [place], [place] [address]   Lactic acid, plasma     Status: None   Collection Time: 03/09/19  9:22 AM  Result Value Ref Range   Lactic Acid, Venous 1.3 0.5 - 1.9 mmol/L    Comment: Performed at [facility]  [address] [place], [place] [address]  I-STAT 7, (LYTES, BLD GAS, ICA, H+H)     Status: Abnormal   Collection Time: 03/09/19 10:52 AM  Result Value Ref Range   pH, Arterial 7.287 (L) 7.350 - 7.450   pCO2 arterial 56.3 (H) 32.0 - 48.0 mmHg   pO2, Arterial 87.0 83.0 - 108.0 mmHg   Bicarbonate 26.9 20.0 - 28.0 mmol/L   TCO2 29 22 - 32 mmol/L   O2 Saturation 95.0 %   Acid-base deficit 1.0 0.0 - 2.0 mmol/L   Sodium 137 135 - 145 mmol/L   Potassium 4.8 3.5 - 5.1 mmol/L   Calcium, Ion 1.34 1.15 - 1.40 mmol/L   HCT 34.0 (L) 36.0 - 46.0 %   Hemoglobin 11.6 (L) 12.0 - 15.0 g/dL   Patient temperature 98.6 F    Sample type ARTERIAL   Troponin I (High Sensitivity)     Status: Abnormal   Collection Time: 03/09/19 11:40 AM  Result Value Ref Range   Troponin I (High Sensitivity) 808 (HH) <18 ng/L    Comment: CRITICAL VALUE NOTED.  VALUE IS CONSISTENT WITH PREVIOUSLY REPORTED AND CALLED VALUE. (NOTE) Elevated high sensitivity troponin I (hsTnI) values and significant  changes across serial measurements may suggest ACS but many other  chronic and acute conditions are known to elevate hsTnI results.  Refer to the Links section for chest pain algorithms and additional  guidance. Performed at [facility], [place] [address] [place], [place] [address]   Troponin I (High Sensitivity)     Status: Abnormal   Collection Time: 03/09/19  2:30 PM  Result Value Ref Range   Troponin I (High Sensitivity) 709 (HH) <18 ng/L    Comment:  CRITICAL VALUE NOTED.  VALUE IS CONSISTENT WITH PREVIOUSLY REPORTED AND CALLED VALUE. (NOTE) Elevated high sensitivity troponin I (hsTnI) values and significant  changes across serial measurements may suggest ACS but many other  chronic and acute conditions are known to elevate hsTnI results.  Refer to the Links section for chest pain algorithms and additional  guidance. Performed at [facility], [place] [address], [place], [place] [address]    Ct Soft Tissue Neck Wo  Contrast  Result Date: 03/09/2019 CLINICAL DATA:  Facial swelling, vomiting, pain, trouble swallowing EXAM: CT NECK WITHOUT CONTRAST TECHNIQUE: Multidetector CT imaging of the neck was performed following the standard protocol without intravenous contrast. COMPARISON:  None. FINDINGS: Motion and streak artifact are present. Pharynx and larynx: Unremarkable within the above limitation. Salivary glands: Submandibular and parotid glands are unremarkable. Thyroid: Small.  Not well evaluated. Lymph nodes: No pathologically enlarged lymph nodes. Vascular: Calcified plaque at the common carotid bifurcations. Limited intracranial: No acute or unexpected abnormality. Visualized orbits: Minimally imaged. Mastoids and visualized paranasal sinuses: Aerated. Skeleton: Degenerative changes of the cervical spine. Upper chest: Better evaluated on concurrent dedicated chest imaging. Other: None. IMPRESSION: No mass or adenopathy.  Suboptimal evaluation due to artifact Electronically Signed   By: Macy Mis M.D.   On: 03/09/2019 10:29   Ct Chest Wo Contrast  Result Date: 03/09/2019 CLINICAL DATA:  Surgery 2 days ago for breast implant removal. Last night with vomiting and face swelling. Pain everywhere. Patient also with shortness of breath and trouble swallowing. EXAM: CT CHEST WITHOUT CONTRAST TECHNIQUE: Multidetector CT imaging of the chest was performed following the standard protocol without IV contrast. COMPARISON:  Chest CT, 08/30/2003.  Current chest radiograph. FINDINGS: Cardiovascular: Heart borderline enlarged. No pericardial effusion. Left coronary artery atherosclerotic calcifications. Mild aortic atherosclerotic calcifications across the arch. Mediastinum/Nodes: No mediastinal or hilar masses. No enlarged lymph nodes. Posterior aspect of the tracheal wall is indented consistent with and expiratory phase study. Trachea is patent. Esophagus is unremarkable. Lungs/Pleura: Lungs demonstrate upper lobe paraseptal  and centrilobular emphysema. Paraseptal emphysema is also noted posteriorly in the lower lobes. There are areas of segmental bronchial wall thickening and mucous plugging, most evident in the lower lobes. Mild heterogeneous peripheral interstitial thickening is noted bilaterally. There is also mild hazy increased attenuation in the upper lobes, that is likely due to air trapping from the low lung volumes. No convincing pneumonia. There is no evidence of pulmonary edema. No lung mass and no suspicious nodule. No pleural effusion or pneumothorax. Upper Abdomen: No definite acute finding. Gallbladder wall is not well-defined. Cannot exclude wall thickening. Musculoskeletal: Bilateral anterior chest wall surgical drains. No chest lateral collection to suggest an abscess or seroma/hematoma. Chronic appearing mild compression fracture of L1. No acute fractures. No osteoblastic or osteolytic lesions. There are degenerative changes throughout the thoracic spine. IMPRESSION: 1. No evidence of pneumonia or pulmonary edema. 2. Bilateral areas of segmental bronchus mucous plugging. Findings that support associated air trapping accentuated by the expiratory timing of this exam. 3. Mild chronic centrilobular and paraseptal emphysema. 4. Postsurgical changes along the anterior chest wall. No evidence of a surgical complication. 5. Left coronary artery and aortic atherosclerotic calcifications. 6. Gallbladder wall is ill-defined. If there is right upper quadrant pain, consider follow-up limited right upper quadrant ultrasound for further assessment. Aortic Atherosclerosis (ICD10-I70.0) and Emphysema (ICD10-J43.9). Electronically Signed   By: Lajean Manes M.D.   On: 03/09/2019 10:30   Dg Chest Portable 1 View  Result Date: 03/09/2019  CLINICAL DATA:  Onset vomiting and swelling last night. Patient status post breast implant removal 03/07/2019. EXAM: PORTABLE CHEST 1 VIEW COMPARISON:  PA and lateral chest 08/26/2017. FINDINGS:  Breast implants have been removed. Bilateral surgical drains are in place. The lungs are clear with emphysematous disease noted. No pneumothorax or pleural fluid. No acute or focal bony abnormality. Atherosclerosis is noted. IMPRESSION: No acute disease. Status post breast implant removal with bilateral surgical drains in place. Emphysema. Atherosclerosis. Electronically Signed   By: Inge Rise M.D.   On: 03/09/2019 09:22    Pending Labs Unresulted Labs (From admission, onward)    Start     Ordered   03/09/19 [id number]  Culture, blood (routine x 2)  BLOOD CULTURE X 2,   STAT     03/09/19 0907   03/09/19 0907  Urine culture  ONCE - STAT,   STAT     03/09/19 0907   03/09/19 0824  Urinalysis, Routine w reflex microscopic  ONCE - STAT,   STAT     03/09/19 [id number]   Signed and Held  Culture, sputum-assessment  Once,   R     Signed and Held   Signed and Held  Basic metabolic panel  Tomorrow morning,   R     Signed and Held   Signed and Held  CBC  Tomorrow morning,   R     Signed and Held          Vitals/Pain Today's Vitals   03/09/19 1445 03/09/19 1453 03/09/19 1545 03/09/19 1600  BP:  (!) 119/99 131/90 112/84  Pulse: 82 82 78 77  Resp:  (!) 24 19 18   Temp:  99.3 F (37.4 C)    TempSrc:  Oral    SpO2: 99% 98% 99% 97%  Weight:      Height:      PainSc:        Isolation Precautions No active isolations  Medications Medications  levofloxacin (LEVAQUIN) tablet 500 mg (has no administration in time range)  dexamethasone (DECADRON) injection 10 mg (10 mg Intravenous Given 03/09/19 0937)  sodium chloride 0.9 % bolus 1,000 mL (0 mLs Intravenous Stopped 03/09/19 1550)  aztreonam (AZACTAM) 2 g in sodium chloride 0.9 % 100 mL IVPB (0 g Intravenous Stopped 03/09/19 1153)  vancomycin (VANCOCIN) 1,500 mg in sodium chloride 0.9 % 500 mL IVPB (0 mg Intravenous Stopped 03/09/19 1550)  aspirin suppository 300 mg (300 mg Rectal Given 03/09/19 1201)  acetaminophen (TYLENOL) suppository 650 mg  (650 mg Rectal Given 03/09/19 1202)    Mobility walks Moderate fall risk   Focused Assessments Pulmonary Assessment Handoff:  Lung sounds: L Breath Sounds: Diminished R Breath Sounds: Diminished O2 Device: Nasal Cannula O2 Flow Rate (L/min): 2 L/min      R Recommendations: See Admitting Provider Note  Report given to:   Additional Notes:

## 2019-03-09 NOTE — ED Notes (Signed)
Patient being transported to CT

## 2019-03-09 NOTE — Plan of Care (Signed)
  Problem: Education: Goal: Knowledge of General Education information will improve Description: Including pain rating scale, medication(s)/side effects and non-pharmacologic comfort measures Outcome: Not Progressing   Problem: Health Behavior/Discharge Planning: Goal: Ability to manage health-related needs will improve Outcome: Not Progressing   Problem: Clinical Measurements: Goal: Ability to maintain clinical measurements within normal limits will improve Outcome: Not Progressing Goal: Will remain free from infection Outcome: Not Progressing Goal: Diagnostic test results will improve Outcome: Not Progressing Goal: Respiratory complications will improve Outcome: Not Progressing Goal: Cardiovascular complication will be avoided Outcome: Not Progressing   Problem: Activity: Goal: Risk for activity intolerance will decrease Outcome: Not Progressing   Problem: Nutrition: Goal: Adequate nutrition will be maintained Outcome: Not Progressing   Problem: Coping: Goal: Level of anxiety will decrease Outcome: Not Progressing   Problem: Elimination: Goal: Will not experience complications related to bowel motility Outcome: Not Progressing Goal: Will not experience complications related to urinary retention Outcome: Not Progressing   Problem: Pain Managment: Goal: General experience of comfort will improve Outcome: Not Progressing   Problem: Safety: Goal: Ability to remain free from injury will improve Outcome: Not Progressing   Problem: Skin Integrity: Goal: Risk for impaired skin integrity will decrease Outcome: Not Progressing   Problem: Respiratory: Goal: Ability to maintain adequate ventilation will improve Outcome: Not Progressing Goal: Ability to maintain a clear airway will improve Outcome: Not Progressing

## 2019-03-09 NOTE — ED Notes (Signed)
Patient transported to X-ray 

## 2019-03-09 NOTE — ED Notes (Signed)
Rounding at bedside

## 2019-03-09 NOTE — ED Triage Notes (Signed)
Patient presents with granddaughter stating patient had surgery Tuesday to have breast implants removed. Last night started having vomiting and swelling to face. C/o pain everywhere.

## 2019-03-09 NOTE — ED Provider Notes (Signed)
[facility] EMERGENCY DEPARTMENT Provider Note   CSN: [id number] Arrival date & time: 03/09/19  [id number]     History   Chief Complaint Chief Complaint  Patient presents with   Post-op Problem    HPI [name] is a 68 y.o. female.     HPI  68 year old female presents with sore throat and cough.  Yesterday she had breast implant removal because one had ruptured.  Seem to wake up okay after surgery.  Last night developed a sore throat.  Feels like it is painful and difficult to swallow.  She also has had cough with white sputum.  May be some wheezing last night but none now.  Some shortness of breath.  She does not wear oxygen was found to have O2 sats of 79% in triage.  No leg swelling.  No chest pain.  She does note that it feels like her neck is somewhat swollen. States she feels sleepy because of poor sleep last night due to these symptoms.  Past Medical History:  Diagnosis Date   Chronic kidney disease (CKD), stage III (moderate)    Diverticulosis    Emphysema lung (HCC)    Helicobacter pylori (H. pylori)    History of IBS    IBS (irritable bowel syndrome)    LBBB (left bundle branch block)    Lower urinary tract infection 06/21/2017   Migraines    OCD (obsessive compulsive disorder)    Prediabetes    pcp   Renal disorder    AKI due to lisinpril, his. pyelonephritis,    Sepsis ([place])    Sleep apnea    no CPAP   Vitamin D deficiency     Patient Active Problem List   Diagnosis Date Noted   Pneumonia due to infectious agent 03/09/2019   Post-operative state 03/09/2019   AKI (acute kidney injury) ([place]) 03/09/2019   Osteopenia 01/16/2019   Colon polyps 06/28/2018   Thrombocytopenia ([place]) 06/19/2015   CKD Stage 3 ([place]) 01/10/2015   IBS  01/10/2015   Mixed hyperlipidemia 06/12/2014   Medication management 06/12/2014   LBBB (left bundle branch block) 07/24/2013   HTN (hypertension) 07/24/2013   Depression, controlled     Diverticulosis    ADHD (attention deficit hyperactivity disorder)    OCD (obsessive compulsive disorder)    Other abnormal glucose    Migraines    Vitamin D deficiency    Hypothyroidism 04/05/2013    Past Surgical History:  Procedure Laterality Date   AUGMENTATION MAMMAPLASTY     BREAST ENHANCEMENT SURGERY     BUNIONECTOMY     Bil   COLON SURGERY     colonoscopy   HEMORRHOID SURGERY     OOPHORECTOMY     ORIF ANKLE FRACTURE Left 08/30/2017   Procedure: OPEN REDUCTION INTERNAL FIXATION (ORIF) ANKLE FRACTURE;  Surgeon: Rod Can, MD;  Location: [place];  Service: Orthopedics;  Laterality: Left;   TONSILLECTOMY       OB History   No obstetric history on file.      Home Medications    Prior to Admission medications   Medication Sig Start Date End Date Taking? Authorizing Provider  ALPRAZolam (XANAX) 0.5 MG tablet TAKE 1/2 TO 1 TABLET BY MOUTH THREE TIMES A DAY IF NEEDED FOR ANXIETY OR SLEEP 07/12/18   Liane Comber, NP  Ascorbic Acid (VITAMIN C PO) Take 250 mg by mouth daily.     [provider]  aspirin EC 81 MG tablet Take 81 mg by  mouth daily.    [provider]  atenolol (TENORMIN) 100 MG tablet Take 1 tablet Daily for BP 08/29/18   Unk [name], MD  B Complex Vitamins (B COMPLEX PO) Take by mouth daily.    [provider]  buPROPion (WELLBUTRIN XL) 150 MG 24 hr tablet Take 3 tablets (450 mg) every Morning for Mood & Depression 12/19/18   Unk [name], MD  Cholecalciferol (VITAMIN D-3) 5000 UNITS TABS Take 5,000 Units by mouth daily.     [provider]  conjugated estrogens (PREMARIN) vaginal cream Place 1 Applicatorful vaginally daily.    [provider]  dicyclomine (BENTYL) 10 MG capsule Take 1/2 to 1 tablet 3 x /day before meals as needed for IBS 12/19/18   Unk [name], MD  dicyclomine (BENTYL) 20 MG tablet Take 1/2 to 1 tablet 3 x /day before Meals for ADD 12/18/18   Unk [name], MD    docusate sodium (COLACE) 100 MG capsule Take 100 mg by mouth 2 (two) times daily as needed for mild constipation.    [provider]  famotidine (PEPCID) 20 MG tablet Take 20 mg by mouth at bedtime.    [provider]  hyoscyamine (LEVBID) 0.375 MG 12 hr tablet TAKE 1 TO 2 TABLETS BY MOUTH TWICE DAILY 05/04/18   Unk [name], MD  hyoscyamine (LEVSIN SL) 0.125 MG SL tablet PLACE 1 TABLET (0.125 MG TOTAL) UNDER THE TONGUE EVERY 4 (FOUR) HOURS AS NEEDED. 06/28/18   Unk [name], MD  levothyroxine (SYNTHROID, LEVOTHROID) 125 MCG tablet Take 1 tablet daily on an empty stomach with only water for 30 minutes & no Antacid meds for 4 hours 05/23/18   Unk [name], MD  Magnesium 250 MG TABS Take 250 mg by mouth daily.    [provider]  meclizine (ANTIVERT) 25 MG tablet TAKE 1 TABLET BY MOUTH 3 TIMES DAILY AS NEEDED FOR NAUSEA 03/08/17   Unk [name], MD  methylphenidate (RITALIN) 20 MG tablet 1/2 to 1 tab 1 to 2 x / daily as needed for ADD or alertness Patient not taking: Reported on 11/15/2018 03/22/17 04/22/17  Unk [name], MD  mirabegron ER (MYRBETRIQ) 25 MG TB24 tablet Take 25 mg by mouth daily.    [provider]  ondansetron (ZOFRAN) 8 MG tablet TAKE 1 TABLET BY MOUTH 3 TIMES A DAY AS NEEDED FOR NAUSEA 06/28/18   Liane Comber, NP  sertraline (ZOLOFT) 100 MG tablet Take 1 tablet Daily for Mood / Depression 11/11/18   Unk [name], MD    Family History Family History  Problem Relation Age of Onset   Colon polyps Mother    Colon cancer Neg Hx     Social History Social History   Tobacco Use   Smoking status: Former Smoker    Packs/day: 1.00    Years: 40.00    Pack years: 40.00    Types: Cigarettes    Quit date: 04/27/2002    Years since quitting: 16.8   Smokeless tobacco: Never Used  Substance Use Topics   Alcohol use: No   Drug use: No     Allergies   Ace inhibitors, Cephalexin, Macrobid [nitrofurantoin], Codeine, Latex,  and Sulfa antibiotics   Review of Systems Review of Systems  Constitutional: Negative for fever.  HENT: Positive for sore throat and trouble swallowing.   Respiratory: Positive for cough, shortness of breath and wheezing.   Cardiovascular: Negative for chest pain and leg swelling.  All other systems reviewed and are negative.    Physical  Exam Updated Vital Signs BP 121/86    Pulse 81    Temp (!) 102.2 F (39 C) (Rectal)    Resp 18    Ht 5\' 3"  (1.6 m)    Wt 73.5 kg    SpO2 98%    BMI 28.70 kg/m   Physical Exam Vitals signs and nursing note reviewed.  Constitutional:      General: She is not in acute distress.    Appearance: She is well-developed. She is not ill-appearing or diaphoretic.  HENT:     Head: Normocephalic and atraumatic.     Right Ear: External ear normal.     Left Ear: External ear normal.     Nose: Nose normal.     Mouth/Throat:     Mouth: Mucous membranes are dry.     Pharynx: No oropharyngeal exudate or posterior oropharyngeal erythema.     Comments: No lip/tongue swelling Eyes:     General:        Right eye: No discharge.        Left eye: No discharge.  Neck:     Musculoskeletal: Neck supple. No muscular tenderness.     Comments: I do not appreciate significant anterior neck swelling on exam. No skin color changes Cardiovascular:     Rate and Rhythm: Normal rate and regular rhythm.     Heart sounds: Normal heart sounds.  Pulmonary:     Effort: Pulmonary effort is normal.     Breath sounds: Normal breath sounds.     Comments: Coarse breath sounds on expiration Abdominal:     Palpations: Abdomen is soft.     Tenderness: There is no abdominal tenderness.  Musculoskeletal:     Right lower leg: No edema.     Left lower leg: No edema.  Skin:    General: Skin is warm and dry.  Neurological:     Mental Status: She is alert.  Psychiatric:        Mood and Affect: Mood is not anxious.      ED Treatments / Results  Labs (all labs ordered are  listed, but only abnormal results are displayed) Labs Reviewed  LACTIC ACID, PLASMA - Abnormal; Notable for the following components:      Result Value   Lactic Acid, Venous 2.2 (*)    All other components within normal limits  COMPREHENSIVE METABOLIC PANEL - Abnormal; Notable for the following components:   Potassium 5.3 (*)    Glucose, Bld 146 (*)    Creatinine, Ser 2.03 (*)    Total Protein 5.7 (*)    Albumin 3.1 (*)    AST 305 (*)    ALT 184 (*)    GFR calc non Af Amer 25 (*)    GFR calc Af Amer 29 (*)    All other components within normal limits  CBC WITH DIFFERENTIAL/PLATELET - Abnormal; Notable for the following components:   RBC 3.84 (*)    MCV 102.6 (*)    Abs Immature Granulocytes 0.10 (*)    All other components within normal limits  BRAIN NATRIURETIC PEPTIDE - Abnormal; Notable for the following components:   B Natriuretic Peptide 418.1 (*)    All other components within normal limits  POCT I-STAT 7, (LYTES, BLD GAS, ICA,H+H) - Abnormal; Notable for the following components:   pH, Arterial 7.287 (*)    pCO2 arterial 56.3 (*)    HCT 34.0 (*)    Hemoglobin 11.6 (*)    All other components within  normal limits  TROPONIN I (HIGH SENSITIVITY) - Abnormal; Notable for the following components:   Troponin I (High Sensitivity) 622 (*)    All other components within normal limits  TROPONIN I (HIGH SENSITIVITY) - Abnormal; Notable for the following components:   Troponin I (High Sensitivity) 808 (*)    All other components within normal limits  SARS CORONAVIRUS 2 (TAT 6-24 HRS)  CULTURE, BLOOD (ROUTINE X 2)  CULTURE, BLOOD (ROUTINE X 2)  URINE CULTURE  LACTIC ACID, PLASMA  URINALYSIS, ROUTINE W REFLEX MICROSCOPIC  I-STAT ARTERIAL BLOOD GAS, ED    EKG None  Radiology Ct Soft Tissue Neck Wo Contrast  Result Date: 03/09/2019 CLINICAL DATA:  Facial swelling, vomiting, pain, trouble swallowing EXAM: CT NECK WITHOUT CONTRAST TECHNIQUE: Multidetector CT imaging of the  neck was performed following the standard protocol without intravenous contrast. COMPARISON:  None. FINDINGS: Motion and streak artifact are present. Pharynx and larynx: Unremarkable within the above limitation. Salivary glands: Submandibular and parotid glands are unremarkable. Thyroid: Small.  Not well evaluated. Lymph nodes: No pathologically enlarged lymph nodes. Vascular: Calcified plaque at the common carotid bifurcations. Limited intracranial: No acute or unexpected abnormality. Visualized orbits: Minimally imaged. Mastoids and visualized paranasal sinuses: Aerated. Skeleton: Degenerative changes of the cervical spine. Upper chest: Better evaluated on concurrent dedicated chest imaging. Other: None. IMPRESSION: No mass or adenopathy.  Suboptimal evaluation due to artifact Electronically Signed   By: Macy Mis M.D.   On: 03/09/2019 10:29   Ct Chest Wo Contrast  Result Date: 03/09/2019 CLINICAL DATA:  Surgery 2 days ago for breast implant removal. Last night with vomiting and face swelling. Pain everywhere. Patient also with shortness of breath and trouble swallowing. EXAM: CT CHEST WITHOUT CONTRAST TECHNIQUE: Multidetector CT imaging of the chest was performed following the standard protocol without IV contrast. COMPARISON:  Chest CT, 08/30/2003.  Current chest radiograph. FINDINGS: Cardiovascular: Heart borderline enlarged. No pericardial effusion. Left coronary artery atherosclerotic calcifications. Mild aortic atherosclerotic calcifications across the arch. Mediastinum/Nodes: No mediastinal or hilar masses. No enlarged lymph nodes. Posterior aspect of the tracheal wall is indented consistent with and expiratory phase study. Trachea is patent. Esophagus is unremarkable. Lungs/Pleura: Lungs demonstrate upper lobe paraseptal and centrilobular emphysema. Paraseptal emphysema is also noted posteriorly in the lower lobes. There are areas of segmental bronchial wall thickening and mucous plugging, most  evident in the lower lobes. Mild heterogeneous peripheral interstitial thickening is noted bilaterally. There is also mild hazy increased attenuation in the upper lobes, that is likely due to air trapping from the low lung volumes. No convincing pneumonia. There is no evidence of pulmonary edema. No lung mass and no suspicious nodule. No pleural effusion or pneumothorax. Upper Abdomen: No definite acute finding. Gallbladder wall is not well-defined. Cannot exclude wall thickening. Musculoskeletal: Bilateral anterior chest wall surgical drains. No chest lateral collection to suggest an abscess or seroma/hematoma. Chronic appearing mild compression fracture of L1. No acute fractures. No osteoblastic or osteolytic lesions. There are degenerative changes throughout the thoracic spine. IMPRESSION: 1. No evidence of pneumonia or pulmonary edema. 2. Bilateral areas of segmental bronchus mucous plugging. Findings that support associated air trapping accentuated by the expiratory timing of this exam. 3. Mild chronic centrilobular and paraseptal emphysema. 4. Postsurgical changes along the anterior chest wall. No evidence of a surgical complication. 5. Left coronary artery and aortic atherosclerotic calcifications. 6. Gallbladder wall is ill-defined. If there is right upper quadrant pain, consider follow-up limited right upper quadrant ultrasound for further assessment. Aortic  Atherosclerosis (ICD10-I70.0) and Emphysema (ICD10-J43.9). Electronically Signed   By: Lajean Manes M.D.   On: 03/09/2019 10:30   Dg Chest Portable 1 View  Result Date: 03/09/2019 CLINICAL DATA:  Onset vomiting and swelling last night. Patient status post breast implant removal 03/07/2019. EXAM: PORTABLE CHEST 1 VIEW COMPARISON:  PA and lateral chest 08/26/2017. FINDINGS: Breast implants have been removed. Bilateral surgical drains are in place. The lungs are clear with emphysematous disease noted. No pneumothorax or pleural fluid. No acute or  focal bony abnormality. Atherosclerosis is noted. IMPRESSION: No acute disease. Status post breast implant removal with bilateral surgical drains in place. Emphysema. Atherosclerosis. Electronically Signed   By: Inge Rise M.D.   On: 03/09/2019 09:22    Procedures .Critical Care Performed by: Sherwood Gambler, MD Authorized by: Sherwood Gambler, MD   Critical care provider statement:    Critical care time (minutes):  35   Critical care time was exclusive of:  Separately billable procedures and treating other patients   Critical care was necessary to treat or prevent imminent or life-threatening deterioration of the following conditions:  Respiratory failure and sepsis   Critical care was time spent personally by me on the following activities:  Development of treatment plan with patient or surrogate, discussions with consultants, evaluation of patient's response to treatment, examination of patient, obtaining history from patient or surrogate, ordering and performing treatments and interventions, ordering and review of laboratory studies, ordering and review of radiographic studies, pulse oximetry, re-evaluation of patient's condition and review of old charts   (including critical care time)  Medications Ordered in ED Medications  vancomycin (VANCOCIN) 1,500 mg in sodium chloride 0.9 % 500 mL IVPB (1,500 mg Intravenous New Bag/Given 03/09/19 1201)  levofloxacin (LEVAQUIN) tablet 500 mg (has no administration in time range)  dexamethasone (DECADRON) injection 10 mg (10 mg Intravenous Given 03/09/19 0937)  sodium chloride 0.9 % bolus 1,000 mL (1,000 mLs Intravenous New Bag/Given 03/09/19 0937)  aztreonam (AZACTAM) 2 g in sodium chloride 0.9 % 100 mL IVPB (0 g Intravenous Stopped 03/09/19 1153)  aspirin suppository 300 mg (300 mg Rectal Given 03/09/19 1201)  acetaminophen (TYLENOL) suppository 650 mg (650 mg Rectal Given 03/09/19 1202)     Initial Impression / Assessment and Plan / ED  Course  I have reviewed the triage vital signs and the nursing notes.  Pertinent labs & imaging results that were available during my care of the patient were reviewed by me and considered in my medical decision making (see chart for details).        Given the patient's hypoxia, cough with sputum production, and fever, I am worried about pneumonia.  Could be aspiration pneumonia.  The patient is mildly sleepy though this has improved and while she is mildly respiratory acidotic, currently she is awake and protecting airway and I do not think she needs BiPAP.  Certainly consider the novel coronavirus.  At this point, she was given IV fluids and broad IV antibiotics.  I did make her surgeon, Dr. Harlow Mares aware, and he will come see her tomorrow.  I doubt this is an acute surgical complication given the surgery was only a day or 2 ago and no focal findings on CT.  Consider PE though with the fever and sputum production this seems less likely.  Cannot get CT angiography at this point given her renal function.  Will admit to the hospitalist service.  [name] was evaluated in Emergency Department on 03/09/2019 for the symptoms described  in the history of present illness. She was evaluated in the context of the global COVID-19 pandemic, which necessitated consideration that the patient might be at risk for infection with the SARS-CoV-2 virus that causes COVID-19. Institutional protocols and algorithms that pertain to the evaluation of patients at risk for COVID-19 are in a state of rapid change based on information released by regulatory bodies including the CDC and federal and state organizations. These policies and algorithms were followed during the patient's care in the ED.   Final Clinical Impressions(s) / ED Diagnoses   Final diagnoses:  Severe sepsis [facility])    ED Discharge Orders    None       Sherwood Gambler, MD 03/09/19 1315

## 2019-03-09 NOTE — ED Notes (Signed)
Admit doctor at bedside

## 2019-03-09 NOTE — H&P (Signed)
History and Physical    [name] [id number] DOB: 05-May-1950 DOA: 03/09/2019  PCP: Unk [name], MD  Patient coming from: home   I have personally briefly reviewed patient's old medical records available.   Chief Complaint: Cough shortness of breath neck swelling  HPI: [name] is a 68 y.o. female with medical history significant of CKD stage III, baseline creatinine about 1.03, COPD with emphysema, chronic left bundle branch block, reported sleep apnea untreated, previous smoker, ADHD and OCD who had silicone implant removed from bilateral chest 2 days ago presents to the emergency room with acute onset of neck swelling, cough and fever from home.  According to the patient and daughter at the bedside, she had breast implant removed after rupture on 11/10 in the surgical center with Dr. Harlow Mares with GI.  She was monitored overnight at the surgical center and went home yesterday with no pulmonary symptoms.  She was not coughing.  Last night she started having sore throat and cough, painful swallowing, she felt like she has neck swelling and has difficult to swallow.  She started progressing sputum that is mostly thick and mucoid.  Had some wheezing last night.  Symptoms are significant so she came to the ER.  She also had poor sleep last night.  Did not record any fever at home, however she was febrile in the ER. ED Course: Febrile with temperature 102.2, 79% on oxygen and corrected by 2 L oxygen by nasal cannula.  WBC normal.  Creatinine 2.03.  Potassium normal.  Lactic acid was 2.2 that is corrected to 1.3.  High-sensitivity troponin 600-800. CT scan of the chest shows bilateral bronchial mucus, no consolidation.  CT scan of the chest does not demonstrate any abscess or cellulitis or collections.  CT neck without any evidence of retropharyngeal abscess, subcutaneous emphysema or collections. Due to hypoxia and significant symptoms, patient was started on antibiotics in the ER with  vancomycin and aztreonam after blood cultures.  COVID-19 test is pending. She did have pre op Covid testing done.   Review of Systems: all systems are reviewed and pertinent positive as per HPI otherwise rest are negative.    Past Medical History:  Diagnosis Date  . Chronic kidney disease (CKD), stage III (moderate)   . Diverticulosis   . Emphysema lung ([place])   . Helicobacter pylori (H. pylori)   . History of IBS   . IBS (irritable bowel syndrome)   . LBBB (left bundle branch block)   . Lower urinary tract infection 06/21/2017  . Migraines   . OCD (obsessive compulsive disorder)   . Prediabetes    pcp  . Renal disorder    AKI due to lisinpril, his. pyelonephritis,   . Sepsis ([place])   . Sleep apnea    no CPAP  . Vitamin D deficiency     Past Surgical History:  Procedure Laterality Date  . AUGMENTATION MAMMAPLASTY    . BREAST ENHANCEMENT SURGERY    . BUNIONECTOMY     Bil  . COLON SURGERY     colonoscopy  . HEMORRHOID SURGERY    . OOPHORECTOMY    . ORIF ANKLE FRACTURE Left 08/30/2017   Procedure: OPEN REDUCTION INTERNAL FIXATION (ORIF) ANKLE FRACTURE;  Surgeon: Rod Can, MD;  Location: [place];  Service: Orthopedics;  Laterality: Left;  . TONSILLECTOMY       reports that she quit smoking about 16 years ago. Her smoking use included cigarettes. She has a 40.00 pack-year smoking history. She  has never used smokeless tobacco. She reports that she does not drink alcohol or use drugs.  Allergies  Allergen Reactions  . Ace Inhibitors Other (See Comments)    Acute renal failure (lisinopril)  . Cephalexin Anaphylaxis  . Macrobid [Nitrofurantoin] Other (See Comments)    Causes hyperthermia   . Codeine Nausea Only  . Latex Rash  . Sulfa Antibiotics Other (See Comments)    Do not take per nephrology    Family History  Problem Relation Age of Onset  . Colon polyps Mother   . Colon cancer Neg Hx      Prior to Admission medications   Medication Sig Start Date End  Date Taking? Authorizing Provider  ALPRAZolam (XANAX) 0.5 MG tablet TAKE 1/2 TO 1 TABLET BY MOUTH THREE TIMES A DAY IF NEEDED FOR ANXIETY OR SLEEP 07/12/18   Liane Comber, NP  Ascorbic Acid (VITAMIN C PO) Take 250 mg by mouth daily.     [provider]  aspirin EC 81 MG tablet Take 81 mg by mouth daily.    [provider]  atenolol (TENORMIN) 100 MG tablet Take 1 tablet Daily for BP 08/29/18   Unk [name], MD  B Complex Vitamins (B COMPLEX PO) Take by mouth daily.    [provider]  buPROPion (WELLBUTRIN XL) 150 MG 24 hr tablet Take 3 tablets (450 mg) every Morning for Mood & Depression 12/19/18   Unk [name], MD  Cholecalciferol (VITAMIN D-3) 5000 UNITS TABS Take 5,000 Units by mouth daily.     [provider]  conjugated estrogens (PREMARIN) vaginal cream Place 1 Applicatorful vaginally daily.    [provider]  dicyclomine (BENTYL) 10 MG capsule Take 1/2 to 1 tablet 3 x /day before meals as needed for IBS 12/19/18   Unk [name], MD  dicyclomine (BENTYL) 20 MG tablet Take 1/2 to 1 tablet 3 x /day before Meals for ADD 12/18/18   Unk [name], MD  docusate sodium (COLACE) 100 MG capsule Take 100 mg by mouth 2 (two) times daily as needed for mild constipation.    [provider]  famotidine (PEPCID) 20 MG tablet Take 20 mg by mouth at bedtime.    [provider]  hyoscyamine (LEVBID) 0.375 MG 12 hr tablet TAKE 1 TO 2 TABLETS BY MOUTH TWICE DAILY 05/04/18   Unk [name], MD  hyoscyamine (LEVSIN SL) 0.125 MG SL tablet PLACE 1 TABLET (0.125 MG TOTAL) UNDER THE TONGUE EVERY 4 (FOUR) HOURS AS NEEDED. 06/28/18   Unk [name], MD  levothyroxine (SYNTHROID, LEVOTHROID) 125 MCG tablet Take 1 tablet daily on an empty stomach with only water for 30 minutes & no Antacid meds for 4 hours 05/23/18   Unk [name], MD  Magnesium 250 MG TABS Take 250 mg by mouth daily.    [provider]  meclizine (ANTIVERT) 25 MG tablet  TAKE 1 TABLET BY MOUTH 3 TIMES DAILY AS NEEDED FOR NAUSEA 03/08/17   Unk [name], MD  methylphenidate (RITALIN) 20 MG tablet 1/2 to 1 tab 1 to 2 x / daily as needed for ADD or alertness Patient not taking: Reported on 11/15/2018 03/22/17 04/22/17  Unk [name], MD  mirabegron ER (MYRBETRIQ) 25 MG TB24 tablet Take 25 mg by mouth daily.    [provider]  ondansetron (ZOFRAN) 8 MG tablet TAKE 1 TABLET BY MOUTH 3 TIMES A DAY AS NEEDED FOR NAUSEA 06/28/18   Liane Comber, NP  sertraline (ZOLOFT) 100 MG tablet Take 1 tablet Daily for Mood /  Depression 11/11/18   Unk [name], MD    Physical Exam: Vitals:   03/09/19 1015 03/09/19 1030 03/09/19 1045 03/09/19 1100  BP:   123/87 121/86  Pulse: 80 84 83 83  Resp:      Temp:      TempSrc:      SpO2: 98% 94% 97% 96%  Weight:      Height:        Constitutional: NAD, calm, comfortable Vitals:   03/09/19 1015 03/09/19 1030 03/09/19 1045 03/09/19 1100  BP:   123/87 121/86  Pulse: 80 84 83 83  Resp:      Temp:      TempSrc:      SpO2: 98% 94% 97% 96%  Weight:      Height:       Eyes: PERRL, lids and conjunctivae normal, patient is currently on 2 L oxygen. ENMT: Mucous membranes are moist. Posterior pharynx clear of any exudate or lesions.Normal dentition.  Neck: normal, supple, no masses, no thyromegaly Respiratory: Bilateral coarse crackles and conducted airway sounds.  Normal respiratory effort. No accessory muscle use.  Patient has postsurgical binder and multiple drains filled with serosanguinous drainage. Some fullness in the neck but no palpable neck swelling.  Cardiovascular: Regular rate and rhythm, no murmurs / rubs / gallops. No extremity edema. 2+ pedal pulses. No carotid bruits.  Abdomen: no tenderness, no masses palpated. No hepatosplenomegaly. Bowel sounds positive.  Musculoskeletal: no clubbing / cyanosis. No joint deformity upper and lower extremities. Good ROM, no contractures. Normal muscle tone.  Skin:  no rashes, lesions, ulcers. No induration Neurologic: CN 2-12 grossly intact. Sensation intact, DTR normal. Strength 5/5 in all 4.  Psychiatric: Normal judgment and insight. Alert and oriented x 3. Normal mood.     Labs on Admission: I have personally reviewed following labs and imaging studies  CBC: Recent Labs  Lab 03/09/19 0824 03/09/19 1052  WBC 6.3  --   NEUTROABS 4.7  --   HGB 12.4 11.6*  HCT 39.4 34.0*  MCV 102.6*  --   PLT 288  --    Basic Metabolic Panel: Recent Labs  Lab 03/09/19 0824 03/09/19 1052  NA 136 137  K 5.3* 4.8  CL 100  --   CO2 26  --   GLUCOSE 146*  --   BUN 20  --   CREATININE 2.03*  --   CALCIUM 9.4  --    GFR: Estimated Creatinine Clearance: 25.8 mL/min (A) (by C-G formula based on SCr of 2.03 mg/dL (H)). Liver Function Tests: Recent Labs  Lab 03/09/19 0824  AST 305*  ALT 184*  ALKPHOS 105  BILITOT 0.8  PROT 5.7*  ALBUMIN 3.1*   No results for input(s): LIPASE, AMYLASE in the last 168 hours. No results for input(s): AMMONIA in the last 168 hours. Coagulation Profile: No results for input(s): INR, PROTIME in the last 168 hours. Cardiac Enzymes: No results for input(s): CKTOTAL, CKMB, CKMBINDEX, TROPONINI in the last 168 hours. BNP (last 3 results) No results for input(s): PROBNP in the last 8760 hours. HbA1C: No results for input(s): HGBA1C in the last 72 hours. CBG: No results for input(s): GLUCAP in the last 168 hours. Lipid Profile: No results for input(s): CHOL, HDL, LDLCALC, TRIG, CHOLHDL, LDLDIRECT in the last 72 hours. Thyroid Function Tests: No results for input(s): TSH, T4TOTAL, FREET4, T3FREE, THYROIDAB in the last 72 hours. Anemia Panel: No results for input(s): VITAMINB12, FOLATE, FERRITIN, TIBC, IRON, RETICCTPCT in the last 72 hours.  Urine analysis:    Component Value Date/Time   COLORURINE YELLOW 11/15/2018 1352   APPEARANCEUR CLEAR 11/15/2018 1352   LABSPEC 1.008 11/15/2018 1352   PHURINE 7.5 11/15/2018  1352   GLUCOSEU NEGATIVE 11/15/2018 1352   HGBUR NEGATIVE 11/15/2018 1352   BILIRUBINUR NEGATIVE 02/13/2016 1554   KETONESUR NEGATIVE 11/15/2018 1352   PROTEINUR NEGATIVE 11/15/2018 1352   UROBILINOGEN 1 04/02/2014 1528   NITRITE NEGATIVE 07/22/2017 1025   LEUKOCYTESUR 2+ (A) 07/22/2017 1025    Radiological Exams on Admission: Ct Soft Tissue Neck Wo Contrast  Result Date: 03/09/2019 CLINICAL DATA:  Facial swelling, vomiting, pain, trouble swallowing EXAM: CT NECK WITHOUT CONTRAST TECHNIQUE: Multidetector CT imaging of the neck was performed following the standard protocol without intravenous contrast. COMPARISON:  None. FINDINGS: Motion and streak artifact are present. Pharynx and larynx: Unremarkable within the above limitation. Salivary glands: Submandibular and parotid glands are unremarkable. Thyroid: Small.  Not well evaluated. Lymph nodes: No pathologically enlarged lymph nodes. Vascular: Calcified plaque at the common carotid bifurcations. Limited intracranial: No acute or unexpected abnormality. Visualized orbits: Minimally imaged. Mastoids and visualized paranasal sinuses: Aerated. Skeleton: Degenerative changes of the cervical spine. Upper chest: Better evaluated on concurrent dedicated chest imaging. Other: None. IMPRESSION: No mass or adenopathy.  Suboptimal evaluation due to artifact Electronically Signed   By: Macy Mis M.D.   On: 03/09/2019 10:29   Ct Chest Wo Contrast  Result Date: 03/09/2019 CLINICAL DATA:  Surgery 2 days ago for breast implant removal. Last night with vomiting and face swelling. Pain everywhere. Patient also with shortness of breath and trouble swallowing. EXAM: CT CHEST WITHOUT CONTRAST TECHNIQUE: Multidetector CT imaging of the chest was performed following the standard protocol without IV contrast. COMPARISON:  Chest CT, 08/30/2003.  Current chest radiograph. FINDINGS: Cardiovascular: Heart borderline enlarged. No pericardial effusion. Left coronary  artery atherosclerotic calcifications. Mild aortic atherosclerotic calcifications across the arch. Mediastinum/Nodes: No mediastinal or hilar masses. No enlarged lymph nodes. Posterior aspect of the tracheal wall is indented consistent with and expiratory phase study. Trachea is patent. Esophagus is unremarkable. Lungs/Pleura: Lungs demonstrate upper lobe paraseptal and centrilobular emphysema. Paraseptal emphysema is also noted posteriorly in the lower lobes. There are areas of segmental bronchial wall thickening and mucous plugging, most evident in the lower lobes. Mild heterogeneous peripheral interstitial thickening is noted bilaterally. There is also mild hazy increased attenuation in the upper lobes, that is likely due to air trapping from the low lung volumes. No convincing pneumonia. There is no evidence of pulmonary edema. No lung mass and no suspicious nodule. No pleural effusion or pneumothorax. Upper Abdomen: No definite acute finding. Gallbladder wall is not well-defined. Cannot exclude wall thickening. Musculoskeletal: Bilateral anterior chest wall surgical drains. No chest lateral collection to suggest an abscess or seroma/hematoma. Chronic appearing mild compression fracture of L1. No acute fractures. No osteoblastic or osteolytic lesions. There are degenerative changes throughout the thoracic spine. IMPRESSION: 1. No evidence of pneumonia or pulmonary edema. 2. Bilateral areas of segmental bronchus mucous plugging. Findings that support associated air trapping accentuated by the expiratory timing of this exam. 3. Mild chronic centrilobular and paraseptal emphysema. 4. Postsurgical changes along the anterior chest wall. No evidence of a surgical complication. 5. Left coronary artery and aortic atherosclerotic calcifications. 6. Gallbladder wall is ill-defined. If there is right upper quadrant pain, consider follow-up limited right upper quadrant ultrasound for further assessment. Aortic  Atherosclerosis (ICD10-I70.0) and Emphysema (ICD10-J43.9). Electronically Signed   By: Dedra Skeens.D.  On: 03/09/2019 10:30   Dg Chest Portable 1 View  Result Date: 03/09/2019 CLINICAL DATA:  Onset vomiting and swelling last night. Patient status post breast implant removal 03/07/2019. EXAM: PORTABLE CHEST 1 VIEW COMPARISON:  PA and lateral chest 08/26/2017. FINDINGS: Breast implants have been removed. Bilateral surgical drains are in place. The lungs are clear with emphysematous disease noted. No pneumothorax or pleural fluid. No acute or focal bony abnormality. Atherosclerosis is noted. IMPRESSION: No acute disease. Status post breast implant removal with bilateral surgical drains in place. Emphysema. Atherosclerosis. Electronically Signed   By: Inge Rise M.D.   On: 03/09/2019 09:22    EKG: Independently reviewed. NSR.  She has left bundle branch block pattern EKG, comparable to previous EKG.  No new changes.  Assessment/Plan Principal Problem:   Pneumonia due to infectious agent Active Problems:   Hypothyroidism   ADHD (attention deficit hyperactivity disorder)   HTN (hypertension)   CKD Stage 3 (HCC)   Post-operative state   AKI (acute kidney injury) ([place])     1.  Acute bronchitis/pneumonia with hypoxia: Patient recently underwent surgery under anesthesia and developed fever, shortness of breath and sputum production with hypoxia. Agree with admission given severity of symptoms. Antibiotics to treat bacterial pneumonia.  She received vancomycin and aztreonam in the emergency room.  Given relative stability, will treat with narrow spectrum antibiotics with Levaquin. Chest physiotherapy, incentive spirometry, deep breathing exercises, sputum induction, mucolytic's and bronchodilators. Sputum cultures, blood cultures Supplemental oxygen to keep saturations more than 90%. COVID-19 results are pending, however she has more like bronchopneumonia.  2.  Acute kidney injury with  history of chronic kidney disease stage IIIa: Probably aggravated by #1.  We will continue to hydrate and monitor levels.  3.  Elevated troponin: With no chest pain.  Patient is postop.  No history of coronary artery disease. Her troponins are significantly elevated, 600-800.  EKG with no acute ischemic changes. Cycle further troponins and EKG Case discussed with Dr. Meda Coffee with cardiology, they will see patient in follow-up, suggested echocardiogram Received a dose of aspirin in the ER.  Currently with no ongoing chest pain, no indication for anticoagulation.  4.  Postoperative state: Underwent bilateral silicone implant removal, day 2 postop.  Clinical exam and CT scan of the chest with no evidence of complications.  Emptying suction bulb as usual. ER physician Dr. Regenia Skeeter discussed with Dr. Harlow Mares who is patient's surgeon and he will see patient tomorrow in the hospital.  Do not remove any dressings until then.  5.  Hypertension: Blood pressures are stable.  Continue home medications.   DVT prophylaxis: Subcu Lovenox Code Status: Full code Family Communication: Daughter at the bedside Disposition Plan: Pending clinical improvement, anticipate home when stable Consults called: Cardiology, patient's outpatient surgery Admission status: Inpatient.  Telemetry.   Barb Merino MD [facility] Pager [phone]

## 2019-03-09 NOTE — Progress Notes (Addendum)
Pharmacy Antibiotic Note  [name] is a 68 y.o. female admitted on 03/09/2019 with HCAP.  Pharmacy has been consulted for aztreonam and vancomycin dosing. Patient with hx anaphylaxis to cephalexin and acute on chronic kidney disease. Dose adjust for renal function.  11/12 10:06am  Vancomycin 500mg  q24hr Scr: 2.03 Est AUC: 413.2  ADDENDUM @ 13:00: CT result: No evidence of pneumonia or pulmonary edema; Bilateral areas of segmental bronchus mucous plugging.   Per Hospitalist, will change antibiotics to levofloxacin given no consolidation or evidence of severe PNA. Levofloxacin adjusted for AKI.   Plan: Stop vancomycin/aztreonam Start levofloxacin 500mg  Q48hr  F/u clinical status, renal fx  Height: 5\' 3"  (160 cm) Weight: 162 lb (73.5 kg) IBW/kg (Calculated) : 52.4  Temp (24hrs), Avg:101.1 F (38.4 C), Min:99.2 F (37.3 C), Max:102.2 F (39 C)  Recent Labs  Lab 03/09/19 0824  WBC 6.3  CREATININE 2.03*  LATICACIDVEN 2.2*    Estimated Creatinine Clearance: 25.8 mL/min (A) (by C-G formula based on SCr of 2.03 mg/dL (H)).    Allergies  Allergen Reactions  . Ace Inhibitors Other (See Comments)    Acute renal failure (lisinopril)  . Cephalexin Anaphylaxis  . Macrobid [Nitrofurantoin] Other (See Comments)    Causes hyperthermia   . Codeine Nausea Only  . Latex Rash  . Sulfa Antibiotics Other (See Comments)    Do not take per nephrology    Antimicrobials this admission: Vancomycin  11/12 >>  Aztreonam 11/12 >>   Microbiology results: 11/12 BCx: pend 11/12 UCx: pend  11/12  Sputum: pend  11/12 COVID: pend  Thank you for allowing pharmacy to be a part of this patient's care.  Benetta Spar, PharmD, BCPS, BCCP Clinical Pharmacist  Please check AMION for all [place] phone numbers After 10:00 PM, call [place] [phone]

## 2019-03-09 NOTE — ED Notes (Signed)
Regular dinner tray ordered 

## 2019-03-10 ENCOUNTER — Inpatient Hospital Stay (HOSPITAL_COMMUNITY): Payer: PPO

## 2019-03-10 ENCOUNTER — Encounter (HOSPITAL_COMMUNITY): Payer: Self-pay | Admitting: Physician Assistant

## 2019-03-10 DIAGNOSIS — A419 Sepsis, unspecified organism: Principal | ICD-10-CM

## 2019-03-10 DIAGNOSIS — I361 Nonrheumatic tricuspid (valve) insufficiency: Secondary | ICD-10-CM

## 2019-03-10 DIAGNOSIS — N179 Acute kidney failure, unspecified: Secondary | ICD-10-CM

## 2019-03-10 DIAGNOSIS — R6520 Severe sepsis without septic shock: Secondary | ICD-10-CM

## 2019-03-10 DIAGNOSIS — R1310 Dysphagia, unspecified: Secondary | ICD-10-CM

## 2019-03-10 DIAGNOSIS — I10 Essential (primary) hypertension: Secondary | ICD-10-CM

## 2019-03-10 DIAGNOSIS — R778 Other specified abnormalities of plasma proteins: Secondary | ICD-10-CM

## 2019-03-10 DIAGNOSIS — J189 Pneumonia, unspecified organism: Secondary | ICD-10-CM

## 2019-03-10 LAB — URINALYSIS, ROUTINE W REFLEX MICROSCOPIC
Bilirubin Urine: NEGATIVE
Glucose, UA: NEGATIVE mg/dL
Hgb urine dipstick: NEGATIVE
Ketones, ur: NEGATIVE mg/dL
Leukocytes,Ua: NEGATIVE
Nitrite: NEGATIVE
Protein, ur: NEGATIVE mg/dL
Specific Gravity, Urine: 1.02 (ref 1.005–1.030)
pH: 5 (ref 5.0–8.0)

## 2019-03-10 LAB — ECHOCARDIOGRAM COMPLETE
Height: 63 in
Weight: 2321 oz

## 2019-03-10 LAB — CBC
HCT: 32.6 % — ABNORMAL LOW (ref 36.0–46.0)
Hemoglobin: 10.6 g/dL — ABNORMAL LOW (ref 12.0–15.0)
MCH: 31.9 pg (ref 26.0–34.0)
MCHC: 32.5 g/dL (ref 30.0–36.0)
MCV: 98.2 fL (ref 80.0–100.0)
Platelets: 188 10*3/uL (ref 150–400)
RBC: 3.32 MIL/uL — ABNORMAL LOW (ref 3.87–5.11)
RDW: 12.8 % (ref 11.5–15.5)
WBC: 5.8 10*3/uL (ref 4.0–10.5)
nRBC: 0 % (ref 0.0–0.2)

## 2019-03-10 LAB — BASIC METABOLIC PANEL
Anion gap: 8 (ref 5–15)
BUN: 21 mg/dL (ref 8–23)
CO2: 28 mmol/L (ref 22–32)
Calcium: 8.8 mg/dL — ABNORMAL LOW (ref 8.9–10.3)
Chloride: 105 mmol/L (ref 98–111)
Creatinine, Ser: 1.09 mg/dL — ABNORMAL HIGH (ref 0.44–1.00)
GFR calc Af Amer: >60 mL/min (ref >60)
GFR calc non Af Amer: 52 mL/min — ABNORMAL LOW (ref >60)
Glucose, Bld: 96 mg/dL (ref 70–99)
Potassium: 4.5 mmol/L (ref 3.5–5.1)
Sodium: 141 mmol/L (ref 135–145)

## 2019-03-10 LAB — IRON AND TIBC
Iron: 50 ug/dL (ref 28–170)
Saturation Ratios: 17 % (ref 10.4–31.8)
TIBC: 300 ug/dL (ref 250–450)
UIBC: 250 ug/dL

## 2019-03-10 LAB — FERRITIN: Ferritin: 32 ng/mL (ref 11–307)

## 2019-03-10 MED ORDER — MORPHINE SULFATE (PF) 2 MG/ML IV SOLN
1.0000 mg | INTRAVENOUS | Status: DC | PRN
  Administered 2019-03-10: 1 mg via INTRAVENOUS
  Filled 2019-03-10: qty 1

## 2019-03-10 MED ORDER — LEVOFLOXACIN IN D5W 750 MG/150ML IV SOLN: 750.0000 mg | INTRAVENOUS | Status: DC

## 2019-03-10 MED ORDER — MORPHINE SULFATE (PF) 4 MG/ML IV SOLN
4.0000 mg | INTRAVENOUS | Status: DC | PRN
  Administered 2019-03-10 – 2019-03-13 (×5): 4 mg via INTRAVENOUS
  Filled 2019-03-10 (×5): qty 1

## 2019-03-10 MED ORDER — MORPHINE SULFATE (PF) 2 MG/ML IV SOLN
2.0000 mg | INTRAVENOUS | Status: DC | PRN
  Administered 2019-03-10: 2 mg via INTRAVENOUS
  Filled 2019-03-10: qty 1

## 2019-03-10 NOTE — Consult Note (Addendum)
Cardiology Consultation:   Patient ID: [name]; [id number]; [birth year]   Admit date: 03/09/2019 Date of Consult: 03/10/2019  Primary Care Provider: Unk [name], MD Primary Cardiologist: Jenkins Rouge, MD 06/20/2015 Primary Electrophysiologist:  None   Patient Profile:   [name] is a 68 y.o. female with a hx of HTN, LBBB, CKD III, OCD, COPD/emphysema, migraines, OSA not on CPAP, who is being seen today for the evaluation of elevated troponin at the request of Dr Teryl Lucy.  Dr Johnsie Cancel last saw her in 2017, preop for hernia surgery. Echo and MV normal.  History of Present Illness:   [name] was admitted 11/12 with HCAP. She had had a breast implant rupture and the implants were removed in the surgical center by Dr Crissie Reese (plastic surgeon) on 11/10.  She did well overnight, d/c 11/11.   On 11/12, she developed sore throat and cough, painful swallowing, thick sputum, fever. She was admitted from the ER. HS troponnin's were elevated and cards asked to see.   [name] is not very active, but she is able to do her housework without difficulty.  She goes up stairs, but does so one at a time because of a fall that she had in the past.  She is able to vacuum and mop.  She does get some dyspnea on exertion with the vacuuming, but never gets chest pain.  She states she was doing fine the day she went home from surgery, had good dinner and was moving around well.  On 11/12, her throat was very sore and she was unable to swallow.  She had some nausea and vomiting.  She is currently unable to even drink liquids.  Her symptoms have not improved since admission.  She never gets chest pain.  She quit smoking 15 years ago, has COPD and emphysema, but dyspnea on exertion has been stable.  She denies lower extremity edema, orthopnea, or PND.  Her daughter pointed out to her that her neck veins were extremely enlarged.  This is new, she has never seen that before.   Past Medical  History:  Diagnosis Date  . Chronic kidney disease (CKD), stage III (moderate)   . Diverticulosis   . Emphysema lung ([place])   . Helicobacter pylori (H. pylori)   . IBS (irritable bowel syndrome)   . LBBB (left bundle branch block)   . Lower urinary tract infection 06/21/2017  . Migraines   . OCD (obsessive compulsive disorder)   . Prediabetes    pcp  . Renal disorder    AKI due to lisinpril, his. pyelonephritis  . Sepsis ([place])   . Sleep apnea    no CPAP  . Vitamin D deficiency     Past Surgical History:  Procedure Laterality Date  . AUGMENTATION MAMMAPLASTY    . BREAST ENHANCEMENT SURGERY    . BREAST IMPLANT REMOVAL Bilateral 03/07/2019  . BUNIONECTOMY     Bil  . COLON SURGERY     colonoscopy  . HEMORRHOID SURGERY    . OOPHORECTOMY    . ORIF ANKLE FRACTURE Left 08/30/2017   Procedure: OPEN REDUCTION INTERNAL FIXATION (ORIF) ANKLE FRACTURE;  Surgeon: Rod Can, MD;  Location: [facility];  Service: Orthopedics;  Laterality: Left;  . TONSILLECTOMY       Prior to Admission medications   Medication Sig Start Date End Date Taking? Authorizing Provider  ALPRAZolam (XANAX) 0.5 MG tablet TAKE 1/2 TO 1 TABLET BY MOUTH THREE TIMES A DAY IF NEEDED FOR ANXIETY OR  SLEEP Patient taking differently: Take 0.25-0.5 mg by mouth 3 (three) times daily as needed for anxiety or sleep.  07/12/18  Yes Liane Comber, NP  atenolol (TENORMIN) 100 MG tablet Take 1 tablet Daily for BP Patient taking differently: Take 100 mg by mouth daily.  08/29/18  Yes Unk [name], MD  buPROPion (WELLBUTRIN XL) 150 MG 24 hr tablet Take 3 tablets (450 mg) every Morning for Mood & Depression 12/19/18  Yes Unk [name], MD  Cholecalciferol (VITAMIN D-3) 5000 UNITS TABS Take 5,000 Units by mouth daily.    Yes [provider]  conjugated estrogens (PREMARIN) vaginal cream Place 1 Applicatorful vaginally daily.   Yes [provider]  dicyclomine (BENTYL) 20 MG tablet Take 1/2 to 1 tablet 3 x /day  before Meals for ADD Patient taking differently: Take 10-20 mg by mouth 3 (three) times daily before meals.  12/18/18  Yes Unk [name], MD  docusate sodium (COLACE) 100 MG capsule Take 100 mg by mouth 2 (two) times daily as needed for mild constipation.   Yes [provider]  famotidine (PEPCID) 20 MG tablet Take 20 mg by mouth at bedtime.   Yes [provider]  HYDROmorphone (DILAUDID) 2 MG tablet Take 2 mg by mouth every 4 (four) hours as needed for pain. 03/01/19  Yes [provider]  hyoscyamine (LEVSIN SL) 0.125 MG SL tablet PLACE 1 TABLET (0.125 MG TOTAL) UNDER THE TONGUE EVERY 4 (FOUR) HOURS AS NEEDED. Patient taking differently: Take 0.125 mg by mouth every 4 (four) hours as needed for cramping.  06/28/18  Yes Unk [name], MD  levothyroxine (SYNTHROID, LEVOTHROID) 125 MCG tablet Take 1 tablet daily on an empty stomach with only water for 30 minutes & no Antacid meds for 4 hours Patient taking differently: Take 125 mcg by mouth daily before breakfast.  05/23/18  Yes Unk [name], MD  Magnesium 250 MG TABS Take 250 mg by mouth daily.   Yes [provider]  methocarbamol (ROBAXIN) 500 MG tablet Take 500 mg by mouth 4 (four) times daily. 03/08/19 03/18/19 Yes [provider]  mirabegron ER (MYRBETRIQ) 25 MG TB24 tablet Take 25 mg by mouth daily.   Yes [provider]  ondansetron (ZOFRAN) 8 MG tablet TAKE 1 TABLET BY MOUTH 3 TIMES A DAY AS NEEDED FOR NAUSEA Patient taking differently: Take 8 mg by mouth 3 (three) times daily as needed for nausea.  06/28/18  Yes Liane Comber, NP  sertraline (ZOLOFT) 100 MG tablet Take 1 tablet Daily for Mood / Depression Patient taking differently: Take 100 mg by mouth daily.  11/11/18  Yes Unk [name], MD  aspirin EC 81 MG tablet Take 81 mg by mouth daily.    [provider]  hyoscyamine (LEVBID) 0.375 MG 12 hr tablet TAKE 1 TO 2 TABLETS BY MOUTH TWICE DAILY Patient not taking:  Reported on 03/09/2019 05/04/18   Unk [name], MD  meclizine (ANTIVERT) 25 MG tablet TAKE 1 TABLET BY MOUTH 3 TIMES DAILY AS NEEDED FOR NAUSEA Patient not taking: Reported on 03/09/2019 03/08/17   Unk [name], MD  methylphenidate (RITALIN) 20 MG tablet 1/2 to 1 tab 1 to 2 x / daily as needed for ADD or alertness Patient not taking: Reported on 11/15/2018 03/22/17 04/22/17  Unk [name], MD    Inpatient Medications: Scheduled Meds: . aspirin EC  81 mg Oral Daily  . atenolol  100 mg Oral Daily  . buPROPion  450 mg Oral Daily  . enoxaparin (LOVENOX) injection  40  mg Subcutaneous Q24H  . famotidine  20 mg Oral QHS  . guaiFENesin  1,200 mg Oral BID  . hyoscyamine  0.375 mg Oral BID  . levothyroxine  125 mcg Oral Q0600  . mirabegron ER  25 mg Oral Daily  . sertraline  100 mg Oral Daily   Continuous Infusions: . sodium chloride 100 mL/hr at 03/10/19 0400  . [START ON 03/11/2019] levofloxacin (LEVAQUIN) IV     PRN Meds: acetaminophen **OR** acetaminophen, albuterol, ALPRAZolam, morphine injection, ondansetron **OR** ondansetron (ZOFRAN) IV, oxyCODONE, phenol  Allergies:    Allergies  Allergen Reactions  . Ace Inhibitors Other (See Comments)    Acute renal failure (lisinopril)  . Cephalexin Anaphylaxis  . Dilaudid [Hydromorphone Hcl]   . Macrobid [Nitrofurantoin] Other (See Comments)    Causes hyperthermia   . Robaxin [Methocarbamol]   . Codeine Nausea Only  . Latex Rash  . Sulfa Antibiotics Other (See Comments)    Do not take per nephrology    Social History:   Social History   Socioeconomic History  . Marital status: Legally Separated    Spouse name: Not on file  . Number of children: Not on file  . Years of education: Not on file  . Highest education level: Not on file  Occupational History  . Not on file  Social Needs  . Financial resource strain: Not on file  . Food insecurity    Worry: Not on file    Inability: Not on file  . Transportation needs     Medical: Not on file    Non-medical: Not on file  Tobacco Use  . Smoking status: Former Smoker    Packs/day: 1.00    Years: 40.00    Pack years: 40.00    Types: Cigarettes    Quit date: 04/27/2002    Years since quitting: 16.8  . Smokeless tobacco: Never Used  Substance and Sexual Activity  . Alcohol use: No  . Drug use: No  . Sexual activity: Not on file  Lifestyle  . Physical activity    Days per week: Not on file    Minutes per session: Not on file  . Stress: Not on file  Relationships  . Social Herbalist on phone: Not on file    Gets together: Not on file    Attends religious service: Not on file    Active member of club or organization: Not on file    Attends meetings of clubs or organizations: Not on file    Relationship status: Not on file  . Intimate partner violence    Fear of current or ex partner: Not on file    Emotionally abused: Not on file    Physically abused: Not on file    Forced sexual activity: Not on file  Other Topics Concern  . Not on file  Social History Narrative  . Not on file    Family History:   Family History  Problem Relation Age of Onset  . Colon polyps Mother   . Colon cancer Neg Hx    Family Status:  Family Status  Relation Name Status  . Mother muscular dystrophy Deceased  . Father  Deceased  . Sister  Alive  . Neg Hx  (Not Specified)    ROS:  Please see the history of present illness.  All other ROS reviewed and negative.     Physical Exam/Data:   Vitals:   03/09/19 2132 03/10/19 0006 03/10/19 0500 03/10/19 [id number]  BP: 119/82 115/80  136/83  Pulse: 75 74  71  Resp:  16  16  Temp: 98.2 F (36.8 C) 98.2 F (36.8 C)  99 F (37.2 C)  TempSrc: Oral Oral  Oral  SpO2: 100% 100%  99%  Weight:   65.8 kg   Height:        Intake/Output Summary (Last 24 hours) at 03/10/2019 1237 Last data filed at 03/10/2019 0900 Gross per 24 hour  Intake 1878.77 ml  Output 460 ml  Net 1418.77 ml   Filed Weights    03/09/19 1000 03/09/19 1854 03/10/19 0500  Weight: 73.5 kg 65.4 kg 65.8 kg   Body mass index is 25.7 kg/m.  General:  Well nourished, well developed, female in no acute distress HEENT: normal Lymph: no adenopathy Neck: JVD - neck veins engorged to jaw Endocrine:  No thryomegaly Vascular: No carotid bruits; 4/4 extremity pulses 2+, without bruits  Cardiac:  normal S1, S2; RRR; no murmur Lungs:  Rales bilaterally, no wheezing, rhonchi; lg dressing over breasts, not disturbed Abd: soft, nontender, no hepatomegaly  Ext: no edema Musculoskeletal:  No deformities, BUE and BLE strength normal and equal Skin: warm and dry  Neuro:  CNs 2-12 intact, no focal abnormalities noted Psych:  Normal affect   EKG:  The EKG was personally reviewed and demonstrates: 11/13, SR, HR 82, LBBB is old but amplitude precordial complexes is greatly decreased from previous. Telemetry:  Telemetry was personally reviewed and demonstrates: Sinus rhythm   CV studies:   ECHO: ORDERED  ECHO: 06/28/2015 - Left ventricle: The cavity size was normal. Wall thickness was at   the upper limits of normal. Systolic function was normal. The   estimated ejection fraction was in the range of 55% to 60%. Wall   motion was normal; there were no regional wall motion   abnormalities. Doppler parameters are consistent with abnormal   left ventricular relaxation (grade 1 diastolic dysfunction). - Aortic valve: There was trivial regurgitation. - Mitral valve: There was trivial regurgitation. - Left atrium: The atrium was mildly dilated. - Right atrium: Central venous pressure (est): 3 mm Hg. - Atrial septum: No defect or patent foramen ovale was identified. - Tricuspid valve: There was mild regurgitation. - Pulmonary arteries: PA peak pressure: 25 mm Hg (S). - Pericardium, extracardiac: There was no pericardial effusion.  Impressions:  - Upper normal LV wall thickness with LVEF 55-60%. Grade 1   diastolic dysfunction  with normal estimated LV filling pressure.   Mild left atrial enlargement. Trivial mitral regurgitation.   Trivial aortic regurgitation. Mild tricuspid regurgitation with   PASP 25 mmHg.  MYOVIEW: 06/28/2015  The left ventricular ejection fraction is normal (55-65%).  Nuclear stress EF: 60%.  There was no ST segment deviation noted during stress.  The study is normal. No evidence of ischemia  This is a low risk study.    Laboratory Data:   Chemistry Recent Labs  Lab 03/09/19 0824 03/09/19 1052 03/10/19 0333  NA 136 137 141  K 5.3* 4.8 4.5  CL 100  --  105  CO2 26  --  28  GLUCOSE 146*  --  96  BUN 20  --  21  CREATININE 2.03*  --  1.09*  CALCIUM 9.4  --  8.8*  GFRNONAA 25*  --  52*  GFRAA 29*  --  >60  ANIONGAP 10  --  8    Lab Results  Component Value Date   ALT 184 (H) 03/09/2019  AST 305 (H) 03/09/2019   ALKPHOS 105 03/09/2019   BILITOT 0.8 03/09/2019   Hematology Recent Labs  Lab 03/09/19 0824 03/09/19 1052 03/10/19 0333  WBC 6.3  --  5.8  RBC 3.84*  --  3.32*  HGB 12.4 11.6* 10.6*  HCT 39.4 34.0* 32.6*  MCV 102.6*  --  98.2  MCH 32.3  --  31.9  MCHC 31.5  --  32.5  RDW 13.1  --  12.8  PLT 288  --  188   Cardiac Enzymes High Sensitivity Troponin:   Recent Labs  Lab 03/09/19 0901 03/09/19 1140 03/09/19 1430 03/09/19 1726  TROPONINIHS 622* 808* 709* 608*      BNP Recent Labs  Lab 03/09/19 0824  BNP 418.1*    TSH:  Lab Results  Component Value Date   TSH 3.45 06/29/2018   Lipids: Lab Results  Component Value Date   CHOL 194 06/29/2018   HDL 66 06/29/2018   LDLCALC 108 (H) 06/29/2018   TRIG 104 06/29/2018   CHOLHDL 2.9 06/29/2018   HgbA1c: Lab Results  Component Value Date   HGBA1C 5.4 03/22/2018   Magnesium:  Magnesium  Date Value Ref Range Status  03/22/2018 2.1 1.5 - 2.5 mg/dL Final     Radiology/Studies:  Ct Soft Tissue Neck Wo Contrast  Result Date: 03/09/2019 CLINICAL DATA:  Facial swelling, vomiting,  pain, trouble swallowing EXAM: CT NECK WITHOUT CONTRAST TECHNIQUE: Multidetector CT imaging of the neck was performed following the standard protocol without intravenous contrast. COMPARISON:  None. FINDINGS: Motion and streak artifact are present. Pharynx and larynx: Unremarkable within the above limitation. Salivary glands: Submandibular and parotid glands are unremarkable. Thyroid: Small.  Not well evaluated. Lymph nodes: No pathologically enlarged lymph nodes. Vascular: Calcified plaque at the common carotid bifurcations. Limited intracranial: No acute or unexpected abnormality. Visualized orbits: Minimally imaged. Mastoids and visualized paranasal sinuses: Aerated. Skeleton: Degenerative changes of the cervical spine. Upper chest: Better evaluated on concurrent dedicated chest imaging. Other: None. IMPRESSION: No mass or adenopathy.  Suboptimal evaluation due to artifact Electronically Signed   By: Macy Mis M.D.   On: 03/09/2019 10:29   Ct Chest Wo Contrast  Result Date: 03/09/2019 CLINICAL DATA:  Surgery 2 days ago for breast implant removal. Last night with vomiting and face swelling. Pain everywhere. Patient also with shortness of breath and trouble swallowing. EXAM: CT CHEST WITHOUT CONTRAST TECHNIQUE: Multidetector CT imaging of the chest was performed following the standard protocol without IV contrast. COMPARISON:  Chest CT, 08/30/2003.  Current chest radiograph. FINDINGS: Cardiovascular: Heart borderline enlarged. No pericardial effusion. Left coronary artery atherosclerotic calcifications. Mild aortic atherosclerotic calcifications across the arch. Mediastinum/Nodes: No mediastinal or hilar masses. No enlarged lymph nodes. Posterior aspect of the tracheal wall is indented consistent with and expiratory phase study. Trachea is patent. Esophagus is unremarkable. Lungs/Pleura: Lungs demonstrate upper lobe paraseptal and centrilobular emphysema. Paraseptal emphysema is also noted posteriorly in  the lower lobes. There are areas of segmental bronchial wall thickening and mucous plugging, most evident in the lower lobes. Mild heterogeneous peripheral interstitial thickening is noted bilaterally. There is also mild hazy increased attenuation in the upper lobes, that is likely due to air trapping from the low lung volumes. No convincing pneumonia. There is no evidence of pulmonary edema. No lung mass and no suspicious nodule. No pleural effusion or pneumothorax. Upper Abdomen: No definite acute finding. Gallbladder wall is not well-defined. Cannot exclude wall thickening. Musculoskeletal: Bilateral anterior chest wall surgical drains. No chest lateral  collection to suggest an abscess or seroma/hematoma. Chronic appearing mild compression fracture of L1. No acute fractures. No osteoblastic or osteolytic lesions. There are degenerative changes throughout the thoracic spine. IMPRESSION: 1. No evidence of pneumonia or pulmonary edema. 2. Bilateral areas of segmental bronchus mucous plugging. Findings that support associated air trapping accentuated by the expiratory timing of this exam. 3. Mild chronic centrilobular and paraseptal emphysema. 4. Postsurgical changes along the anterior chest wall. No evidence of a surgical complication. 5. Left coronary artery and aortic atherosclerotic calcifications. 6. Gallbladder wall is ill-defined. If there is right upper quadrant pain, consider follow-up limited right upper quadrant ultrasound for further assessment. Aortic Atherosclerosis (ICD10-I70.0) and Emphysema (ICD10-J43.9). Electronically Signed   By: Lajean Manes M.D.   On: 03/09/2019 10:30   Dg Chest Portable 1 View  Result Date: 03/09/2019 CLINICAL DATA:  Onset vomiting and swelling last night. Patient status post breast implant removal 03/07/2019. EXAM: PORTABLE CHEST 1 VIEW COMPARISON:  PA and lateral chest 08/26/2017. FINDINGS: Breast implants have been removed. Bilateral surgical drains are in place. The  lungs are clear with emphysematous disease noted. No pneumothorax or pleural fluid. No acute or focal bony abnormality. Atherosclerosis is noted. IMPRESSION: No acute disease. Status post breast implant removal with bilateral surgical drains in place. Emphysema. Atherosclerosis. Electronically Signed   By: Inge Rise M.D.   On: 03/09/2019 09:22    Assessment and Plan:   1. Elevated troponin:  - no history of chest pain or other ischemic symptoms -No history of CHF or volume overload, respiratory status at baseline prior to admission per patient -Slight crescendo/decrescendo pattern, not definitive -Echo was ordered, contacted the echo techs and they will obtain what images they are able to get without disturbing her bandages/dressings -If EF is abnormal, will need ischemic eval -If EF is normal, with no wall motion abnormalities, MD advise on ischemic evaluation and its timing  2.  JVD -The JVD is marked, sudden onset, and no explanation is seen on CT of the neck/chest -Follow-up on echo results  Otherwise, per IM Principal Problem:   Pneumonia due to infectious agent Active Problems:   Hypothyroidism   ADHD (attention deficit hyperactivity disorder)   HTN (hypertension)   CKD Stage 3 (HCC)   Post-operative state   AKI (acute kidney injury) ([place])  For questions or updates, please contact [facility] Please consult [email or web address] for contact info under Cardiology/STEMI.   Signed, Rosaria Ferries, PA-C  03/10/2019 12:37 PM   The patient was seen, examined and discussed with Rosaria Ferries, PA-C and I agree with the above.   68 y.o. female with a hx of HTN, LBBB, CKD III, OCD, COPD/emphysema, migraines, OSA not on CPAP, followed by Dr Johnsie Cancel, last seen in 2017.  She had had a breast implant rupture and the implants were removed in the surgical center by Dr Crissie Reese (plastic surgeon) on 11/1, she was admitted on 11/12 with HCAP.  HS troponnin's were elevated, the patient  denies any chest pain or dyspnea other than on strenuous exercise that is stable, Troponin 808->709->608. Negative stress test in 2017.  She is asymptomatic now and ECG shows chronic LBBB. Physical exams shows elevated JVDs, lungs CTA, S1,2, no murmur, warm extremities. She quit smoking 15 years ago, has COPD and emphysema, CXR shows no CHF.  Chest CTA shows emphysema and LAD calcification (personallly reviewed).  A/P Troponin elevation most probably demand ischemia in the settings of HCAP. I would obtain an echocardiogram, if there are any  new LVEF decrease or WMA I would consider a stress test once she recovers from an acute illness. Otherwise medical management. Continue ASA and atenolol.    Ena Dawley, MD 03/10/2019

## 2019-03-10 NOTE — Progress Notes (Signed)
Pharmacy Antibiotic Note  [name] is a 68 y.o. female admitted on 03/09/2019 with HCAP.  Pharmacy has been consulted for Levaquin dosing. Patient with hx anaphylaxis to cephalexin and acute on chronic kidney disease.   SCr improving - down to 1.09.  WBC is within normal limits.  Lactic acid is improving.  Fever curve is trending down.   Plan: Increase Levaquin to 750 mg IV every 48 hours - stop date in place.  F/u clinical status, renal fx  Height: 5\' 3"  (160 cm) Weight: 145 lb 1 oz (65.8 kg) IBW/kg (Calculated) : 52.4  Temp (24hrs), Avg:98.5 F (36.9 C), Min:98 F (36.7 C), Max:99.3 F (37.4 C)  Recent Labs  Lab 03/09/19 0824 03/09/19 0922 03/10/19 0333  WBC 6.3  --  5.8  CREATININE 2.03*  --  1.09*  LATICACIDVEN 2.2* 1.3  --     Estimated Creatinine Clearance: 45.7 mL/min (A) (by C-G formula based on SCr of 1.09 mg/dL (H)).    Allergies  Allergen Reactions  . Ace Inhibitors Other (See Comments)    Acute renal failure (lisinopril)  . Cephalexin Anaphylaxis  . Dilaudid [Hydromorphone Hcl]   . Macrobid [Nitrofurantoin] Other (See Comments)    Causes hyperthermia   . Robaxin [Methocarbamol]   . Codeine Nausea Only  . Latex Rash  . Sulfa Antibiotics Other (See Comments)    Do not take per nephrology    Antimicrobials this admission: Vancomycin  11/12 >> 11/12 Aztreonam 11/12 >> 11/12 Levaquin 11/12 >>  Microbiology results: 11/12 BCx: pend 11/12 UCx: pend  11/12  Sputum: pend  11/12 COVID: pend  Thank you for allowing pharmacy to be a part of this patient's care.  Sloan Leiter, PharmD, BCPS, BCCCP Clinical Pharmacist Please refer to [facility] for [place] numbers 03/10/2019, 11:25 AM

## 2019-03-10 NOTE — Progress Notes (Signed)
Patient ID: [name], female   DOB: 20-Apr-1951, 68 y.o.   MRN: [id number]   Patient is 3 days status post bilateral total capsulectomy and removal of breast implant material for bilateral failure of silicone gel implants. She was discharged from the [facility] on Wednesday and was doing well. Apparently she developed some coughing and vomiting the first night she was at home and then had difficulty swallowing and fever.  Her surgical sites are fine. Both breasts are soft without evidence of blleding or infection. Drains are functioning and drainage is thin.  She has a working diagnosis of aspiration pneumonia although there was no evidence of pneumonia on initial radiologic evaluation.   She should continue with her drains (empty them 3 times a day) and may switch to a sports bra for breast support.

## 2019-03-10 NOTE — Progress Notes (Addendum)
[facility]  PROGRESS NOTE  [name] [id number] DOB: 1951-01-07 DOA: 03/09/2019 PCP: Lauren [name], MD Admit date - 03/09/2019   Admitting Physician Barb Merino, MD  Outpatient Primary MD for the patient is Lauren [name], MD  LOS - 1 Brief Narrative   [name] is a 68 y.o. year old female with medical history significant for COPD with emphysema, chronic left bundle branch block, CKD stage III who presented on 03/09/2019 after having breast implants removed 03/08/19 with one day of worsening facial swelling, vomiting, difficulty swallowing, and cough productive of white sputum. In the ED was found to have fever of 102, oxygen desaturation of 79% on room air requiring 2 L nasal cannula otherwise hemodynamically stable.  High-sensitivity troponin of 622, CO2 on ABG of 56, lactic acid of 2.2, potassium 5.3, creatinine 2.03, BNP 418.  CT chest showed evidence of chronic emphysema with mucous plugging but no evidence of pneumonia or pulmonary edema.  CT neck showed no acute abnormalities.  Patient was admitted and working diagnosis of bronchitis/pneumonia and given aztreonam and vancomycin in the ED.  Continued on Levaquin under [facility] management.  HSTropoinin 622--> 808-->709    Subjective  Ms.  [name] today complains of pain with swallowing ongoing for the past day. No SOB, mild cough. Feels her neck is swelling  A & P    1. Acute hypoxic respiratory failure, presumed secondary to pneumonia.  CT imaging negative for pneumonia, Covid test negative.  But does show chronic emphysema with mucous plugging.  Patient has remained afebrile here without white count but does have productive cough seems more consistent with COPD flare given mucus plugging on imaging.  Continue Levaquin, check procalcitonin trend if negative favor discontinuing antibiotics. .  Monitoring blood cultures and sputum culture.  2. Severe sepsis presumed secondary to pna/copd. Presented  febrile, hypoxic, with AKI on CKD and mild lactic acidosis all currently resolved after antibiotics, monitor cultures  3. Odynophagia, decreased vocalization after brief intubation for outpatient procedure a few days prior to admission.  CT neck/chest wo contrast shows no obvious abnormalities. Normal O2 saturation, protecting airway, will consult ENT to evaluate larynx ( ? Chemical irritation to vocal cords from vomiting or sequelae from brief intubation)ir nonacute. No swelling on exam. Complains of pain with swallowing. Speech following. Pain control  4. Elevated HS Troponin, suspect demand ischemia related to infection. Peaked at 808 and now downtrended.  Nonischemic EKG.   Cardiology consulted- rec TTE.  5. Breast implants removed, chest wall surgical drains in place. No acute findings on CXR or CT chest.  Dr. Harlow Schultz to see patient in hospital  6. AKI on CKD stage IIIa, improving. Peak of 2.03, back to baseline of 1.09 with IVF 7. Transaminitis, new. No abdominal pain, bilirubin wnl. Trend CMP. Check hepatitis panel  8. Lactic acidosis, resolved. Peak of 2.2. Improved with IVF  9. Chronic Emphysema with mucus plugging on imaging. No PNA. Co2 56 on ABG. No overt wheezing. Add flutter valve, IS, on levaquin, PRN inhalers  10. Normocytic anemia. Seems has a normal baseline hgb. Check iron panel  11. HTN, stable. Home atenolol  12. Hypothyroidism, Home synthroid  13. Mood disorder. Home zoloft     Family Communication  :  None at bedside  Code Status :  FULL  Disposition Plan  :  Monitor respiratory status, evaluate other causes for troponin leak  Consults  :  Cardiology  Procedures  :  none  DVT Prophylaxis  :  Lovenox -  Lab Results  Component Value Date   PLT 188 03/10/2019    Diet :  Diet Order            Diet regular Room service appropriate? Yes; Fluid consistency: Thin  Diet effective now               Inpatient Medications Scheduled Meds: . aspirin EC   81 mg Oral Daily  . atenolol  100 mg Oral Daily  . buPROPion  450 mg Oral Daily  . enoxaparin (LOVENOX) injection  40 mg Subcutaneous Q24H  . famotidine  20 mg Oral QHS  . guaiFENesin  1,200 mg Oral BID  . hyoscyamine  0.375 mg Oral BID  . levothyroxine  125 mcg Oral Q0600  . mirabegron ER  25 mg Oral Daily  . sertraline  100 mg Oral Daily   Continuous Infusions: . sodium chloride 100 mL/hr at 03/10/19 0400  . levofloxacin (LEVAQUIN) IV Stopped (03/10/19 0030)   PRN Meds:.acetaminophen **OR** acetaminophen, albuterol, ALPRAZolam, ondansetron **OR** ondansetron (ZOFRAN) IV, oxyCODONE, phenol  Antibiotics  :   Anti-infectives (From admission, onward)   Start     Dose/Rate Route Frequency Ordered Stop   03/10/19 1000  vancomycin (VANCOCIN) 500 mg in sodium chloride 0.9 % 100 mL IVPB  Status:  Discontinued     500 mg 100 mL/hr over 60 Minutes Intravenous Every 24 hours 03/09/19 1025 03/09/19 1257   03/09/19 2200  levofloxacin (LEVAQUIN) IVPB 500 mg     500 mg 100 mL/hr over 60 Minutes Intravenous Every 48 hours 03/09/19 1858 03/19/19 2159   03/09/19 1800  aztreonam (AZACTAM) 1 g in sodium chloride 0.9 % 100 mL IVPB  Status:  Discontinued     1 g 200 mL/hr over 30 Minutes Intravenous Every 8 hours 03/09/19 1025 03/09/19 1257   03/09/19 1700  levofloxacin (LEVAQUIN) tablet 500 mg  Status:  Discontinued     500 mg Oral Every 48 hours 03/09/19 1258 03/09/19 2133   03/09/19 1015  vancomycin (VANCOCIN) 1,500 mg in sodium chloride 0.9 % 500 mL IVPB     1,500 mg 250 mL/hr over 120 Minutes Intravenous  Once 03/09/19 1001 03/09/19 1550   03/09/19 0945  vancomycin (VANCOCIN) IVPB 1000 mg/200 mL premix  Status:  Discontinued     1,000 mg 200 mL/hr over 60 Minutes Intravenous  Once 03/09/19 0940 03/09/19 1001   03/09/19 0945  aztreonam (AZACTAM) 2 g in sodium chloride 0.9 % 100 mL IVPB     2 g 200 mL/hr over 30 Minutes Intravenous  Once 03/09/19 0940 03/09/19 1153       Objective    Vitals:   03/09/19 2132 03/10/19 0006 03/10/19 0500 03/10/19 0821  BP: 119/82 115/80  136/83  Pulse: 75 74  71  Resp:  16  16  Temp: 98.2 F (36.8 C) 98.2 F (36.8 C)  99 F (37.2 C)  TempSrc: Oral Oral  Oral  SpO2: 100% 100%  99%  Weight:   65.8 kg   Height:        SpO2: 99 % O2 Flow Rate (L/min): 2 L/min  Wt Readings from Last 3 Encounters:  03/10/19 65.8 kg  11/15/18 73.6 kg  07/01/18 73.5 kg     Intake/Output Summary (Last 24 hours) at 03/10/2019 0939 Last data filed at 03/10/2019 0654 Gross per 24 hour  Intake 1978.77 ml  Output 460 ml  Net 1518.77 ml    Physical Exam:  Awake Alert, Oriented X  3, Normal affect In pain but no acute distress No new F.N deficits,  [place].AT, No JVD Symmetrical Chest wall movement, Good air movement bilaterally, CTAB Dressing in place over chest wall +ve B.Sounds, Abd Soft, No tenderness,  No rebound, guarding or rigidity. No Cyanosis, Clubbing or edema, No new Rash or bruise     I have personally reviewed the following:   Data Reviewed:  CBC Recent Labs  Lab 03/09/19 0824 03/09/19 1052 03/10/19 0333  WBC 6.3  --  5.8  HGB 12.4 11.6* 10.6*  HCT 39.4 34.0* 32.6*  PLT 288  --  188  MCV 102.6*  --  98.2  MCH 32.3  --  31.9  MCHC 31.5  --  32.5  RDW 13.1  --  12.8  LYMPHSABS 1.0  --   --   MONOABS 0.5  --   --   EOSABS 0.1  --   --   BASOSABS 0.0  --   --     Chemistries  Recent Labs  Lab 03/09/19 0824 03/09/19 1052 03/10/19 0333  NA 136 137 141  K 5.3* 4.8 4.5  CL 100  --  105  CO2 26  --  28  GLUCOSE 146*  --  96  BUN 20  --  21  CREATININE 2.03*  --  1.09*  CALCIUM 9.4  --  8.8*  AST 305*  --   --   ALT 184*  --   --   ALKPHOS 105  --   --   BILITOT 0.8  --   --    ------------------------------------------------------------------------------------------------------------------ No results for input(s): CHOL, HDL, LDLCALC, TRIG, CHOLHDL, LDLDIRECT in the last 72 hours.  Lab Results  Component  Value Date   HGBA1C 5.4 03/22/2018   ------------------------------------------------------------------------------------------------------------------ No results for input(s): TSH, T4TOTAL, T3FREE, THYROIDAB in the last 72 hours.  Invalid input(s): FREET3 ------------------------------------------------------------------------------------------------------------------ No results for input(s): VITAMINB12, FOLATE, FERRITIN, TIBC, IRON, RETICCTPCT in the last 72 hours.  Coagulation profile No results for input(s): INR, PROTIME in the last 168 hours.  No results for input(s): DDIMER in the last 72 hours.  Cardiac Enzymes No results for input(s): CKMB, TROPONINI, MYOGLOBIN in the last 168 hours.  Invalid input(s): CK ------------------------------------------------------------------------------------------------------------------    Component Value Date/Time   BNP 418.1 (H) 03/09/2019 [id number]    Micro Results Recent Results (from the past 240 hour(s))  SARS CORONAVIRUS 2 (TAT 6-24 HRS) Nasopharyngeal Nasopharyngeal Swab     Status: None   Collection Time: 03/09/19  9:03 AM   Specimen: Nasopharyngeal Swab  Result Value Ref Range Status   SARS Coronavirus 2 NEGATIVE NEGATIVE Final    Comment: (NOTE) SARS-CoV-2 target nucleic acids are NOT DETECTED. The SARS-CoV-2 RNA is generally detectable in upper and lower respiratory specimens during the acute phase of infection. Negative results do not preclude SARS-CoV-2 infection, do not rule out co-infections with other pathogens, and should not be used as the sole basis for treatment or other patient management decisions. Negative results must be combined with clinical observations, patient history, and epidemiological information. The expected result is Negative. Fact Sheet for Patients: SugarRoll.be Fact Sheet for Healthcare Providers: https://www.woods-mathews.com/ This test is not yet approved  or cleared by the [place] FDA and  has been authorized for detection and/or diagnosis of SARS-CoV-2 by FDA under an Emergency Use Authorization (EUA). This EUA will remain  in effect (meaning this test can be used) for the duration of the COVID-19 declaration under Section 56 4(b)(1) of the  Act, 21 U.S.C. section 360bbb-3(b)(1), unless the authorization is terminated or revoked sooner. Performed at [facility], [place] [address], [place], [place] [address]     Radiology Reports Ct Soft Tissue Neck Wo Contrast  Result Date: 03/09/2019 CLINICAL DATA:  Facial swelling, vomiting, pain, trouble swallowing EXAM: CT NECK WITHOUT CONTRAST TECHNIQUE: Multidetector CT imaging of the neck was performed following the standard protocol without intravenous contrast. COMPARISON:  None. FINDINGS: Motion and streak artifact are present. Pharynx and larynx: Unremarkable within the above limitation. Salivary glands: Submandibular and parotid glands are unremarkable. Thyroid: Small.  Not well evaluated. Lymph nodes: No pathologically enlarged lymph nodes. Vascular: Calcified plaque at the common carotid bifurcations. Limited intracranial: No acute or unexpected abnormality. Visualized orbits: Minimally imaged. Mastoids and visualized paranasal sinuses: Aerated. Skeleton: Degenerative changes of the cervical spine. Upper chest: Better evaluated on concurrent dedicated chest imaging. Other: None. IMPRESSION: No mass or adenopathy.  Suboptimal evaluation due to artifact Electronically Signed   By: Macy Mis M.D.   On: 03/09/2019 10:29   Ct Chest Wo Contrast  Result Date: 03/09/2019 CLINICAL DATA:  Surgery 2 days ago for breast implant removal. Last night with vomiting and face swelling. Pain everywhere. Patient also with shortness of breath and trouble swallowing. EXAM: CT CHEST WITHOUT CONTRAST TECHNIQUE: Multidetector CT imaging of the chest was performed following the standard protocol without IV  contrast. COMPARISON:  Chest CT, 08/30/2003.  Current chest radiograph. FINDINGS: Cardiovascular: Heart borderline enlarged. No pericardial effusion. Left coronary artery atherosclerotic calcifications. Mild aortic atherosclerotic calcifications across the arch. Mediastinum/Nodes: No mediastinal or hilar masses. No enlarged lymph nodes. Posterior aspect of the tracheal wall is indented consistent with and expiratory phase study. Trachea is patent. Esophagus is unremarkable. Lungs/Pleura: Lungs demonstrate upper lobe paraseptal and centrilobular emphysema. Paraseptal emphysema is also noted posteriorly in the lower lobes. There are areas of segmental bronchial wall thickening and mucous plugging, most evident in the lower lobes. Mild heterogeneous peripheral interstitial thickening is noted bilaterally. There is also mild hazy increased attenuation in the upper lobes, that is likely due to air trapping from the low lung volumes. No convincing pneumonia. There is no evidence of pulmonary edema. No lung mass and no suspicious nodule. No pleural effusion or pneumothorax. Upper Abdomen: No definite acute finding. Gallbladder wall is not well-defined. Cannot exclude wall thickening. Musculoskeletal: Bilateral anterior chest wall surgical drains. No chest lateral collection to suggest an abscess or seroma/hematoma. Chronic appearing mild compression fracture of L1. No acute fractures. No osteoblastic or osteolytic lesions. There are degenerative changes throughout the thoracic spine. IMPRESSION: 1. No evidence of pneumonia or pulmonary edema. 2. Bilateral areas of segmental bronchus mucous plugging. Findings that support associated air trapping accentuated by the expiratory timing of this exam. 3. Mild chronic centrilobular and paraseptal emphysema. 4. Postsurgical changes along the anterior chest wall. No evidence of a surgical complication. 5. Left coronary artery and aortic atherosclerotic calcifications. 6. Gallbladder  wall is ill-defined. If there is right upper quadrant pain, consider follow-up limited right upper quadrant ultrasound for further assessment. Aortic Atherosclerosis (ICD10-I70.0) and Emphysema (ICD10-J43.9). Electronically Signed   By: Lajean Manes M.D.   On: 03/09/2019 10:30   Dg Chest Portable 1 View  Result Date: 03/09/2019 CLINICAL DATA:  Onset vomiting and swelling last night. Patient status post breast implant removal 03/07/2019. EXAM: PORTABLE CHEST 1 VIEW COMPARISON:  PA and lateral chest 08/26/2017. FINDINGS: Breast implants have been removed. Bilateral surgical drains are in place. The lungs are  clear with emphysematous disease noted. No pneumothorax or pleural fluid. No acute or focal bony abnormality. Atherosclerosis is noted. IMPRESSION: No acute disease. Status post breast implant removal with bilateral surgical drains in place. Emphysema. Atherosclerosis. Electronically Signed   By: Inge Rise M.D.   On: 03/09/2019 09:22     Time Spent in minutes  30     Desiree Hane M.D on 03/10/2019 at 9:39 AM  To page go to [email or web address] - password [email or web address]

## 2019-03-10 NOTE — Progress Notes (Signed)
Pt complaining of 9/10 throat pain unrelieved after PRN Morphine. MD paged

## 2019-03-10 NOTE — Evaluation (Signed)
Clinical/Bedside Swallow Evaluation Patient Details  Name: [name] MRN: [id number] Date of Birth: [birth year]  Today's Date: 03/10/2019 Time: SLP Start Time (ACUTE ONLY): 1342 SLP Stop Time (ACUTE ONLY): 1404 SLP Time Calculation (min) (ACUTE ONLY): 22 min  Past Medical History:  Past Medical History:  Diagnosis Date  . Chronic kidney disease (CKD), stage III (moderate)   . Diverticulosis   . Emphysema lung ([place])   . Helicobacter pylori (H. pylori)   . IBS (irritable bowel syndrome)   . LBBB (left bundle branch block)   . Lower urinary tract infection 06/21/2017  . Migraines   . OCD (obsessive compulsive disorder)   . Prediabetes    pcp  . Renal disorder    AKI due to lisinpril, his. pyelonephritis  . Sepsis ([place])   . Sleep apnea    no CPAP  . Vitamin D deficiency    Past Surgical History:  Past Surgical History:  Procedure Laterality Date  . AUGMENTATION MAMMAPLASTY    . BREAST ENHANCEMENT SURGERY    . BREAST IMPLANT REMOVAL Bilateral 03/07/2019  . BUNIONECTOMY     Bil  . COLON SURGERY     colonoscopy  . HEMORRHOID SURGERY    . OOPHORECTOMY    . ORIF ANKLE FRACTURE Left 08/30/2017   Procedure: OPEN REDUCTION INTERNAL FIXATION (ORIF) ANKLE FRACTURE;  Surgeon: Rod Can, MD;  Location: [facility];  Service: Orthopedics;  Laterality: Left;  . TONSILLECTOMY     HPI:  68 y.o. female with medical history significant for CKD stage III,  COPD with emphysema, reported sleep apnea untreated, who had silicone implant removed from bilateral chest 11/11 presented to the emergency room 11/12 with acute onset of neck swelling, cough and fever from home.  CT neck without any evidence of retropharyngeal abscess, subcutaneous emphysema or collections. Dx acute bronchitis/pna with hypoxia, AKI. Pt has c/o of difficulty swallowing, choking.    Assessment / Plan / Recommendation Clinical Impression  Pt presents with acute onset dysphagia with inability to swallow ice chips or manage  her saliva.  Oral mechanism exam reveals no focal CN deficits (tongue symmetric upon extension, + palatal elevation).  Dentition present.  Pt endorses odynophagia.  Voice is quite hoarse with low pitch (RN describes worsening today, potentially related to constant coughing).  All attempts to swallow ice chips led to immediate coughing and expectoration.  She is unable to consume any POs. Etiology of dysphagia unknown. Pt may benefit from ENT consult to examine integrity of larynx.  SLP will follow; pt may need instrumental swallow study; continue ice chips if able. D/W RN.  SLP Visit Diagnosis: Dysphagia, oropharyngeal phase (R13.12)    Aspiration Risk       Diet Recommendation   ice chips as able       Other  Recommendations Recommended Consults: Consider ENT evaluation   Follow up Recommendations None      Frequency and Duration min 2x/week  1 week       Prognosis Prognosis for Safe Diet Advancement: Good      Swallow Study   General Date of Onset: 03/09/19 HPI: 68 y.o. female with medical history significant for CKD stage III,  COPD with emphysema, reported sleep apnea untreated, who had silicone implant removed from bilateral chest 11/11 presented to the emergency room 11/12 with acute onset of neck swelling, cough and fever from home.  CT neck without any evidence of retropharyngeal abscess, subcutaneous emphysema or collections. Dx acute bronchitis/pna with hypoxia, AKI. Pt has  c/o of difficulty swallowing, choking.  Type of Study: Bedside Swallow Evaluation Previous Swallow Assessment: no Diet Prior to this Study: Regular;Thin liquids Temperature Spikes Noted: Yes Respiratory Status: Room air History of Recent Intubation: (for surgery) Behavior/Cognition: Alert;Cooperative;Pleasant mood Oral Cavity Assessment: Within Functional Limits Oral Care Completed by SLP: No Oral Cavity - Dentition: Adequate natural dentition Vision: Functional for self-feeding Self-Feeding  Abilities: Able to feed self Patient Positioning: Upright in bed Baseline Vocal Quality: Hoarse Volitional Cough: Strong Volitional Swallow: Unable to elicit    Oral/Motor/Sensory Function Overall Oral Motor/Sensory Function: Within functional limits   Ice Chips Ice chips: Impaired Pharyngeal Phase Impairments: Unable to trigger swallow;Cough - Immediate   Thin Liquid Thin Liquid: Not tested    Nectar Thick Nectar Thick Liquid: Not tested   Honey Thick Honey Thick Liquid: Not tested   Puree Puree: Not tested   Solid     Solid: Not tested      [name] 03/10/2019,2:15 PM   [name], [place] Office number [phone] Pager [phone]

## 2019-03-10 NOTE — Progress Notes (Signed)
Daughter updated on pt condition and plan of care.

## 2019-03-10 NOTE — Progress Notes (Signed)
  Echocardiogram 2D Echocardiogram has been performed.  Darlina Sicilian M 03/10/2019, 1:51 PM

## 2019-03-10 NOTE — CV Procedure (Signed)
Echo attempted but nto done due to patient just having breast surgery and surgical bandages in place where echo images are obtained.

## 2019-03-10 NOTE — Progress Notes (Signed)
Pt refusing all oral medications at this time due to throat pain.  Pt also stating that she is unable to swallow and chokes on water and pills.   Pt c/o throat swelling and requesting IV pain medication.  Oral pain meds offered & pt refused.   Pt not appearing in respiratory distress at this time and lung sounds are clear bilaterally.   MD notified.

## 2019-03-11 DIAGNOSIS — I248 Other forms of acute ischemic heart disease: Secondary | ICD-10-CM

## 2019-03-11 DIAGNOSIS — R7401 Elevation of levels of liver transaminase levels: Secondary | ICD-10-CM | POA: Insufficient documentation

## 2019-03-11 DIAGNOSIS — I5031 Acute diastolic (congestive) heart failure: Secondary | ICD-10-CM | POA: Diagnosis present

## 2019-03-11 DIAGNOSIS — I5032 Chronic diastolic (congestive) heart failure: Secondary | ICD-10-CM | POA: Diagnosis present

## 2019-03-11 DIAGNOSIS — I447 Left bundle-branch block, unspecified: Secondary | ICD-10-CM

## 2019-03-11 DIAGNOSIS — N1831 Chronic kidney disease, stage 3a: Secondary | ICD-10-CM

## 2019-03-11 DIAGNOSIS — R1310 Dysphagia, unspecified: Secondary | ICD-10-CM

## 2019-03-11 LAB — URINE CULTURE: Culture: NO GROWTH

## 2019-03-11 LAB — CBC
HCT: 34.4 % — ABNORMAL LOW (ref 36.0–46.0)
Hemoglobin: 10.9 g/dL — ABNORMAL LOW (ref 12.0–15.0)
MCH: 32 pg (ref 26.0–34.0)
MCHC: 31.7 g/dL (ref 30.0–36.0)
MCV: 100.9 fL — ABNORMAL HIGH (ref 80.0–100.0)
Platelets: 197 10*3/uL (ref 150–400)
RBC: 3.41 MIL/uL — ABNORMAL LOW (ref 3.87–5.11)
RDW: 13.2 % (ref 11.5–15.5)
WBC: 6.5 10*3/uL (ref 4.0–10.5)
nRBC: 0 % (ref 0.0–0.2)

## 2019-03-11 LAB — COMPREHENSIVE METABOLIC PANEL
ALT: 163 U/L — ABNORMAL HIGH (ref 0–44)
AST: 114 U/L — ABNORMAL HIGH (ref 15–41)
Albumin: 2.7 g/dL — ABNORMAL LOW (ref 3.5–5.0)
Alkaline Phosphatase: 72 U/L (ref 38–126)
Anion gap: 11 (ref 5–15)
BUN: 23 mg/dL (ref 8–23)
CO2: 24 mmol/L (ref 22–32)
Calcium: 8.5 mg/dL — ABNORMAL LOW (ref 8.9–10.3)
Chloride: 104 mmol/L (ref 98–111)
Creatinine, Ser: 1.14 mg/dL — ABNORMAL HIGH (ref 0.44–1.00)
GFR calc Af Amer: 58 mL/min — ABNORMAL LOW (ref >60)
GFR calc non Af Amer: 50 mL/min — ABNORMAL LOW (ref >60)
Glucose, Bld: 81 mg/dL (ref 70–99)
Potassium: 3.7 mmol/L (ref 3.5–5.1)
Sodium: 139 mmol/L (ref 135–145)
Total Bilirubin: 0.9 mg/dL (ref 0.3–1.2)
Total Protein: 5.5 g/dL — ABNORMAL LOW (ref 6.5–8.1)

## 2019-03-11 LAB — HEPATITIS PANEL, ACUTE
HCV Ab: NONREACTIVE
Hep A IgM: NONREACTIVE
Hep B C IgM: NONREACTIVE
Hepatitis B Surface Ag: NONREACTIVE

## 2019-03-11 LAB — PROCALCITONIN: Procalcitonin: <0.1 ng/mL

## 2019-03-11 LAB — TSH: TSH: 0.495 u[IU]/mL (ref 0.350–4.500)

## 2019-03-11 MED ORDER — LEVOTHYROXINE SODIUM 100 MCG/5ML IV SOLN
100.0000 ug | Freq: Every day | INTRAVENOUS | Status: DC
  Administered 2019-03-11 – 2019-03-14 (×4): 100 ug via INTRAVENOUS
  Filled 2019-03-11 (×5): qty 5

## 2019-03-11 MED ORDER — FUROSEMIDE 10 MG/ML IJ SOLN
20.0000 mg | Freq: Once | INTRAMUSCULAR | Status: AC
  Administered 2019-03-11: 20 mg via INTRAVENOUS
  Filled 2019-03-11: qty 2

## 2019-03-11 NOTE — Consult Note (Signed)
Consult Note for [facility]  Reason for Consult: Dysphagia and odynophagia Referring Physician: [facility]  [name] HPI: [name] with a PMH of COPD and CKD who started to experience dysphagia and odynophagia one day after her breast implant removal surgery.  She had the breast implants removed as they were leaking silicone and they were implanted 40 years ago.  She started to have dysphagia and fever one day after her surgery.  Since that time she was not able to tolerate any PO.  The patient complained about having cough, odynophagia and neck swelling.  Her inability to tolerate any oral intake is the reasion she presented to the ER.  In the ER her temperature was noted to be 102.2, but her WBC was normal as well as the neck imaging.  There were no reports that she was a difficult intubation.  Past Medical History:  Diagnosis Date  . Chronic kidney disease (CKD), stage III (moderate)   . Diverticulosis   . Emphysema lung ([place])   . Helicobacter pylori (H. pylori)   . IBS (irritable bowel syndrome)   . LBBB (left bundle branch block)   . Lower urinary tract infection 06/21/2017  . Migraines   . OCD (obsessive compulsive disorder)   . Prediabetes    pcp  . Renal disorder    AKI due to lisinpril, his. pyelonephritis  . Sepsis ([place])   . Sleep apnea    no CPAP  . Vitamin D deficiency     Past Surgical History:  Procedure Laterality Date  . AUGMENTATION MAMMAPLASTY    . BREAST ENHANCEMENT SURGERY    . BREAST IMPLANT REMOVAL Bilateral 03/07/2019  . BUNIONECTOMY     Bil  . COLON SURGERY     colonoscopy  . HEMORRHOID SURGERY    . OOPHORECTOMY    . ORIF ANKLE FRACTURE Left 08/30/2017   Procedure: OPEN REDUCTION INTERNAL FIXATION (ORIF) ANKLE FRACTURE;  Surgeon: Rod Can, MD;  Location: [facility];  Service: Orthopedics;  Laterality: Left;  . TONSILLECTOMY      Family History  Problem Relation Age of Onset  . Colon polyps Mother   . Colon cancer  Neg Hx     Social History:  reports that she quit smoking about 16 years ago. Her smoking use included cigarettes. She has a 40.00 pack-year smoking history. She has never used smokeless tobacco. She reports that she does not drink alcohol or use drugs.  Allergies:  Allergies  Allergen Reactions  . Ace Inhibitors Other (See Comments)    Acute renal failure (lisinopril)  . Cephalexin Anaphylaxis  . Dilaudid [Hydromorphone Hcl]   . Macrobid [Nitrofurantoin] Other (See Comments)    Causes hyperthermia   . Robaxin [Methocarbamol]   . Codeine Nausea Only  . Latex Rash  . Sulfa Antibiotics Other (See Comments)    Do not take per nephrology    Medications:  Scheduled: . aspirin EC  81 mg Oral Daily  . atenolol  100 mg Oral Daily  . buPROPion  450 mg Oral Daily  . enoxaparin (LOVENOX) injection  40 mg Subcutaneous Q24H  . famotidine  20 mg Oral QHS  . guaiFENesin  1,200 mg Oral BID  . hyoscyamine  0.375 mg Oral BID  . levothyroxine  100 mcg Intravenous Daily  . mirabegron ER  25 mg Oral Daily  . sertraline  100 mg Oral Daily   Continuous:   Results for orders placed or performed during the  hospital encounter of 03/09/19 (from the past 24 hour(s))  Procalcitonin     Status: None   Collection Time: 03/11/19  4:10 AM  Result Value Ref Range   Procalcitonin <0.10 ng/mL  CBC     Status: Abnormal   Collection Time: 03/11/19  4:10 AM  Result Value Ref Range   WBC 6.5 4.0 - 10.5 K/uL   RBC 3.41 (L) 3.87 - 5.11 MIL/uL   Hemoglobin 10.9 (L) 12.0 - 15.0 g/dL   HCT 34.4 (L) 36.0 - 46.0 %   MCV 100.9 (H) 80.0 - 100.0 fL   MCH 32.0 26.0 - 34.0 pg   MCHC 31.7 30.0 - 36.0 g/dL   RDW 13.2 11.5 - 15.5 %   Platelets 197 150 - 400 K/uL   nRBC 0.0 0.0 - 0.2 %  Comprehensive metabolic panel     Status: Abnormal   Collection Time: 03/11/19  4:10 AM  Result Value Ref Range   Sodium 139 135 - 145 mmol/L   Potassium 3.7 3.5 - 5.1 mmol/L   Chloride 104 98 - 111 mmol/L   CO2 24 22 - 32  mmol/L   Glucose, Bld 81 70 - 99 mg/dL   BUN 23 8 - 23 mg/dL   Creatinine, Ser 1.14 (H) 0.44 - 1.00 mg/dL   Calcium 8.5 (L) 8.9 - 10.3 mg/dL   Total Protein 5.5 (L) 6.5 - 8.1 g/dL   Albumin 2.7 (L) 3.5 - 5.0 g/dL   AST 114 (H) 15 - 41 U/L   ALT 163 (H) 0 - 44 U/L   Alkaline Phosphatase 72 38 - 126 U/L   Total Bilirubin 0.9 0.3 - 1.2 mg/dL   GFR calc non Af Amer 50 (L) >60 mL/min   GFR calc Af Amer 58 (L) >60 mL/min   Anion gap 11 5 - 15     No results found.  ROS:  As stated above in the HPI otherwise negative.  Blood pressure 120/71, pulse (!) 56, temperature 97.7 F (36.5 C), temperature source Oral, resp. rate 12, height 5\' 3"  (1.6 m), weight 69.5 kg, SpO2 99 %.    PE: Gen: NAD, Alert and Oriented HEENT:  [place]/AT, EOMI Neck: Supple, no LAD Lungs: CTA Bilaterally CV: RRR without M/G/R ABM: Soft, NTND, +BS Ext: No C/C/E  Assessment/Plan: 1) Dysphagia. 2) Odynophagia. 3) Nausea/vomiting. 4) COPD.   The source of her symptoms are not clear.  I will perform an EGD tomorrow for further evaluation.  Plan: 1) EGD tomorrow.  Roselin Wiemann D 03/11/2019, 4:37 PM

## 2019-03-11 NOTE — Progress Notes (Signed)
[facility]  PROGRESS NOTE  [name] [id number] DOB: May 21, 1950 DOA: 03/09/2019 PCP: Unk [name], MD Admit date - 03/09/2019   Admitting Physician Barb Merino, MD  Outpatient Primary MD for the patient is Unk [name], MD  LOS - 2 Brief Narrative   [name] is a 68 y.o. year old female with medical history significant for COPD with emphysema, chronic left bundle branch block, CKD stage III who presented on 03/09/2019 after having breast implants removed 03/08/19 with one day of worsening facial swelling, vomiting, difficulty swallowing, and cough productive of white sputum. In the ED was found to have fever of 102, oxygen desaturation of 79% on room air requiring 2 L nasal cannula otherwise hemodynamically stable.  High-sensitivity troponin of 622, CO2 on ABG of 56, lactic acid of 2.2, potassium 5.3, creatinine 2.03, BNP 418.  CT chest showed evidence of chronic emphysema with mucous plugging but no evidence of pneumonia or pulmonary edema.  CT neck showed no acute abnormalities.  Patient was admitted and working diagnosis of bronchitis/pneumonia and given aztreonam and vancomycin in the ED.  Continued on Levaquin under [facility] management.  HSTropoinin 622--> 808-->709 Hospital course complicated by persistent dysphagia and odynophagia    Subjective  [name] today complains of continued pain with swallowing. Only able to chew ice and then suction out. No problems breathing. No chest pain.  A & P    1. Dysphagia and odynophagia after coughing/emesis episode, severe as no PO intake since admission. Since admission has had no PO meds or diet. CT neck/chest unremarkable. Discussed with ENT on call Dr. Blenda Nicely given no stridor, normal O2 saturation, unremarkable imaging and timeline of symptoms ( pain started after an emesis episode at home) seems more consistent with possible esophageal pathology. Will consult GI to consider esophagram vs other  imaging.   2. Acute hypoxic respiratory failure,admittd with working diagnosis of pneumonia.  However CT imaging negative for pneumonia, Covid test negative, procalcitonin negative. Cultures unremarkable. Has no cough or dyspnea. Will dc Levaquin and monitor.  3. SIRS criteria Presented febrile, hypoxic, with AKI on CKD and mild lactic acidosis. Presumed infection but CT chest/CXR imaging with no pneumonia and cultures unremarkable. Will dc abx and monitor given noncontributory procalcitonin  4. Elevated HS Troponin, suspect demand ischemia related to infection. Peaked at 808 and now downtrended.  Nonischemic EKG.   No WMA on TTE, cardiology seen.  5. Breast implants removed, chest wall surgical drains in place. No acute findings on CXR or CT chest.  Dr. Harlow Mares to see patient in hospital  6. AKI on CKD stage IIIa, resolved. Peak of 2.03, back to baseline of 1.09 with IVF  7. Grade 1 diastolic CHF, with mild exacerbation. BNP > 400, distended neck veins, slight crackles on lung exam, not on O2. Likely related to IVF given for AKI on admission. IVF d/c'd given lasix x1 since unable to tolerate PO due to #1 and monitor volume status  8. Transaminitis, new, improving. No abdominal pain, bilirubin wnl. Trend CMP. Check hepatitis panel  9. Lactic acidosis, resolved. Peak of 2.2. Improved with IVF  10. Chronic Emphysema with mucus plugging on imaging. No PNA. Co2 56 on ABG. No overt wheezing. encourage flutter valve, IS, dc levaquin, PRN inhalers  11. Normocytic anemia. Seems has a normal baseline hgb. Iron panel consistent with Iron deficiency anemia  12. HTN, stable. Unable to give Home atenolol because #1  13. Hypothyroidism, Unable to give po Home synthroid, pharm to  assist with IV dosing  14. Mood disorder. Unable to give po Home zoloft because of #1     Family Communication  :  None at bedside  Code Status :  FULL  Disposition Plan  :  Monitor respiratory status, evaluate other  causes for troponin leak  Consults  :  Cardiology, GI  Procedures  :  none  DVT Prophylaxis  :  Lovenox   Lab Results  Component Value Date   PLT 197 03/11/2019    Diet :  Diet Order            Diet regular Room service appropriate? Yes; Fluid consistency: Thin  Diet effective now               Inpatient Medications Scheduled Meds:  aspirin EC  81 mg Oral Daily   atenolol  100 mg Oral Daily   buPROPion  450 mg Oral Daily   enoxaparin (LOVENOX) injection  40 mg Subcutaneous Q24H   famotidine  20 mg Oral QHS   guaiFENesin  1,200 mg Oral BID   hyoscyamine  0.375 mg Oral BID   levothyroxine  100 mcg Intravenous Daily   mirabegron ER  25 mg Oral Daily   sertraline  100 mg Oral Daily   Continuous Infusions:  levofloxacin (LEVAQUIN) IV     PRN Meds:.acetaminophen **OR** acetaminophen, albuterol, ALPRAZolam, morphine injection, ondansetron **OR** ondansetron (ZOFRAN) IV, oxyCODONE, phenol  Antibiotics  :   Anti-infectives (From admission, onward)   Start     Dose/Rate Route Frequency Ordered Stop   03/11/19 2200  levofloxacin (LEVAQUIN) IVPB 750 mg     750 mg 100 mL/hr over 90 Minutes Intravenous Every 48 hours 03/10/19 1124 03/19/19 2159   03/10/19 1000  vancomycin (VANCOCIN) 500 mg in sodium chloride 0.9 % 100 mL IVPB  Status:  Discontinued     500 mg 100 mL/hr over 60 Minutes Intravenous Every 24 hours 03/09/19 1025 03/09/19 1257   03/09/19 2200  levofloxacin (LEVAQUIN) IVPB 500 mg  Status:  Discontinued     500 mg 100 mL/hr over 60 Minutes Intravenous Every 48 hours 03/09/19 1858 03/10/19 1124   03/09/19 1800  aztreonam (AZACTAM) 1 g in sodium chloride 0.9 % 100 mL IVPB  Status:  Discontinued     1 g 200 mL/hr over 30 Minutes Intravenous Every 8 hours 03/09/19 1025 03/09/19 1257   03/09/19 1700  levofloxacin (LEVAQUIN) tablet 500 mg  Status:  Discontinued     500 mg Oral Every 48 hours 03/09/19 1258 03/09/19 2133   03/09/19 1015  vancomycin (VANCOCIN)  1,500 mg in sodium chloride 0.9 % 500 mL IVPB     1,500 mg 250 mL/hr over 120 Minutes Intravenous  Once 03/09/19 1001 03/09/19 1550   03/09/19 0945  vancomycin (VANCOCIN) IVPB 1000 mg/200 mL premix  Status:  Discontinued     1,000 mg 200 mL/hr over 60 Minutes Intravenous  Once 03/09/19 0940 03/09/19 1001   03/09/19 0945  aztreonam (AZACTAM) 2 g in sodium chloride 0.9 % 100 mL IVPB     2 g 200 mL/hr over 30 Minutes Intravenous  Once 03/09/19 0940 03/09/19 1153       Objective   Vitals:   03/10/19 2203 03/11/19 0300 03/11/19 0741 03/11/19 0801  BP: (!) 157/95   120/71  Pulse: 68   (!) 56  Resp: 16  12   Temp: 98.1 F (36.7 C)   97.7 F (36.5 C)  TempSrc: Oral   Oral  SpO2: 99%   99%  Weight:  69.5 kg    Height:        SpO2: 99 % O2 Flow Rate (L/min): 2 L/min  Wt Readings from Last 3 Encounters:  03/11/19 69.5 kg  11/15/18 73.6 kg  07/01/18 73.5 kg     Intake/Output Summary (Last 24 hours) at 03/11/2019 1239 Last data filed at 03/11/2019 1200 Gross per 24 hour  Intake 460 ml  Output 905 ml  Net -445 ml    Physical Exam:  Awake Alert, Oriented X 3, Normal affect Uncomfortable in bed holding cold compress to her neck No new F.N deficits,  [place].AT, Distended neck veins Symmetrical Chest wall movement, Good air movement bilaterally on room air, crackles at bases Dressing in place over chest wall No peripheral edema   I have personally reviewed the following:   Data Reviewed:  CBC Recent Labs  Lab 03/09/19 0824 03/09/19 1052 03/10/19 0333 03/11/19 0410  WBC 6.3  --  5.8 6.5  HGB 12.4 11.6* 10.6* 10.9*  HCT 39.4 34.0* 32.6* 34.4*  PLT 288  --  188 197  MCV 102.6*  --  98.2 100.9*  MCH 32.3  --  31.9 32.0  MCHC 31.5  --  32.5 31.7  RDW 13.1  --  12.8 13.2  LYMPHSABS 1.0  --   --   --   MONOABS 0.5  --   --   --   EOSABS 0.1  --   --   --   BASOSABS 0.0  --   --   --     Chemistries  Recent Labs  Lab 03/09/19 0824 03/09/19 1052 03/10/19 0333  03/11/19 0410  NA 136 137 141 139  K 5.3* 4.8 4.5 3.7  CL 100  --  105 104  CO2 26  --  28 24  GLUCOSE 146*  --  96 81  BUN 20  --  21 23  CREATININE 2.03*  --  1.09* 1.14*  CALCIUM 9.4  --  8.8* 8.5*  AST 305*  --   --  114*  ALT 184*  --   --  163*  ALKPHOS 105  --   --  72  BILITOT 0.8  --   --  0.9   ------------------------------------------------------------------------------------------------------------------ No results for input(s): CHOL, HDL, LDLCALC, TRIG, CHOLHDL, LDLDIRECT in the last 72 hours.  Lab Results  Component Value Date   HGBA1C 5.4 03/22/2018   ------------------------------------------------------------------------------------------------------------------ No results for input(s): TSH, T4TOTAL, T3FREE, THYROIDAB in the last 72 hours.  Invalid input(s): FREET3 ------------------------------------------------------------------------------------------------------------------ Recent Labs    03/10/19 1250  FERRITIN 32  TIBC 300  IRON 50    Coagulation profile No results for input(s): INR, PROTIME in the last 168 hours.  No results for input(s): DDIMER in the last 72 hours.  Cardiac Enzymes No results for input(s): CKMB, TROPONINI, MYOGLOBIN in the last 168 hours.  Invalid input(s): CK ------------------------------------------------------------------------------------------------------------------    Component Value Date/Time   BNP 418.1 (H) 03/09/2019 [id number]    Micro Results Recent Results (from the past 240 hour(s))  SARS CORONAVIRUS 2 (TAT 6-24 HRS) Nasopharyngeal Nasopharyngeal Swab     Status: None   Collection Time: 03/09/19  9:03 AM   Specimen: Nasopharyngeal Swab  Result Value Ref Range Status   SARS Coronavirus 2 NEGATIVE NEGATIVE Final    Comment: (NOTE) SARS-CoV-2 target nucleic acids are NOT DETECTED. The SARS-CoV-2 RNA is generally detectable in upper and lower respiratory specimens during the acute phase of  infection.  Negative results do not preclude SARS-CoV-2 infection, do not rule out co-infections with other pathogens, and should not be used as the sole basis for treatment or other patient management decisions. Negative results must be combined with clinical observations, patient history, and epidemiological information. The expected result is Negative. Fact Sheet for Patients: SugarRoll.be Fact Sheet for Healthcare Providers: https://www.woods-mathews.com/ This test is not yet approved or cleared by the [place] FDA and  has been authorized for detection and/or diagnosis of SARS-CoV-2 by FDA under an Emergency Use Authorization (EUA). This EUA will remain  in effect (meaning this test can be used) for the duration of the COVID-19 declaration under Section 56 4(b)(1) of the Act, 21 U.S.C. section 360bbb-3(b)(1), unless the authorization is terminated or revoked sooner. Performed at [facility], [place] [address], [place], [place] [address]   Culture, blood (routine x 2)     Status: None (Preliminary result)   Collection Time: 03/09/19  9:12 AM   Specimen: BLOOD LEFT ARM  Result Value Ref Range Status   Specimen Description BLOOD LEFT ARM  Final   Special Requests   Final    BOTTLES DRAWN AEROBIC AND ANAEROBIC Blood Culture adequate volume   Culture   Final    NO GROWTH 1 DAY Performed at [facility], [place] [address] [place], [place] [address]    Report Status PENDING  Incomplete  Culture, blood (routine x 2)     Status: None (Preliminary result)   Collection Time: 03/09/19  9:22 AM   Specimen: BLOOD RIGHT ARM  Result Value Ref Range Status   Specimen Description BLOOD RIGHT ARM  Final   Special Requests   Final    BOTTLES DRAWN AEROBIC AND ANAEROBIC Blood Culture adequate volume   Culture   Final    NO GROWTH 1 DAY Performed at [facility], [place] [address], [place], [place] [address]    Report Status PENDING  Incomplete   Urine culture     Status: None   Collection Time: 03/10/19  9:19 AM   Specimen: Urine, Clean Catch  Result Value Ref Range Status   Specimen Description URINE, CLEAN CATCH  Final   Special Requests NONE  Final   Culture   Final    NO GROWTH Performed at [facility], [place] [address], [place], [place] [address]    Report Status 03/11/2019 FINAL  Final    Radiology Reports Ct Soft Tissue Neck Wo Contrast  Result Date: 03/09/2019 CLINICAL DATA:  Facial swelling, vomiting, pain, trouble swallowing EXAM: CT NECK WITHOUT CONTRAST TECHNIQUE: Multidetector CT imaging of the neck was performed following the standard protocol without intravenous contrast. COMPARISON:  None. FINDINGS: Motion and streak artifact are present. Pharynx and larynx: Unremarkable within the above limitation. Salivary glands: Submandibular and parotid glands are unremarkable. Thyroid: Small.  Not well evaluated. Lymph nodes: No pathologically enlarged lymph nodes. Vascular: Calcified plaque at the common carotid bifurcations. Limited intracranial: No acute or unexpected abnormality. Visualized orbits: Minimally imaged. Mastoids and visualized paranasal sinuses: Aerated. Skeleton: Degenerative changes of the cervical spine. Upper chest: Better evaluated on concurrent dedicated chest imaging. Other: None. IMPRESSION: No mass or adenopathy.  Suboptimal evaluation due to artifact Electronically Signed   By: Macy Mis M.D.   On: 03/09/2019 10:29   Ct Chest Wo Contrast  Result Date: 03/09/2019 CLINICAL DATA:  Surgery 2 days ago for breast implant removal. Last night with vomiting and face swelling. Pain everywhere.  Patient also with shortness of breath and trouble swallowing. EXAM: CT CHEST WITHOUT CONTRAST TECHNIQUE: Multidetector CT imaging of the chest was performed following the standard protocol without IV contrast. COMPARISON:  Chest CT, 08/30/2003.  Current chest radiograph. FINDINGS: Cardiovascular: Heart borderline  enlarged. No pericardial effusion. Left coronary artery atherosclerotic calcifications. Mild aortic atherosclerotic calcifications across the arch. Mediastinum/Nodes: No mediastinal or hilar masses. No enlarged lymph nodes. Posterior aspect of the tracheal wall is indented consistent with and expiratory phase study. Trachea is patent. Esophagus is unremarkable. Lungs/Pleura: Lungs demonstrate upper lobe paraseptal and centrilobular emphysema. Paraseptal emphysema is also noted posteriorly in the lower lobes. There are areas of segmental bronchial wall thickening and mucous plugging, most evident in the lower lobes. Mild heterogeneous peripheral interstitial thickening is noted bilaterally. There is also mild hazy increased attenuation in the upper lobes, that is likely due to air trapping from the low lung volumes. No convincing pneumonia. There is no evidence of pulmonary edema. No lung mass and no suspicious nodule. No pleural effusion or pneumothorax. Upper Abdomen: No definite acute finding. Gallbladder wall is not well-defined. Cannot exclude wall thickening. Musculoskeletal: Bilateral anterior chest wall surgical drains. No chest lateral collection to suggest an abscess or seroma/hematoma. Chronic appearing mild compression fracture of L1. No acute fractures. No osteoblastic or osteolytic lesions. There are degenerative changes throughout the thoracic spine. IMPRESSION: 1. No evidence of pneumonia or pulmonary edema. 2. Bilateral areas of segmental bronchus mucous plugging. Findings that support associated air trapping accentuated by the expiratory timing of this exam. 3. Mild chronic centrilobular and paraseptal emphysema. 4. Postsurgical changes along the anterior chest wall. No evidence of a surgical complication. 5. Left coronary artery and aortic atherosclerotic calcifications. 6. Gallbladder wall is ill-defined. If there is right upper quadrant pain, consider follow-up limited right upper quadrant  ultrasound for further assessment. Aortic Atherosclerosis (ICD10-I70.0) and Emphysema (ICD10-J43.9). Electronically Signed   By: Lajean Manes M.D.   On: 03/09/2019 10:30   Dg Chest Portable 1 View  Result Date: 03/09/2019 CLINICAL DATA:  Onset vomiting and swelling last night. Patient status post breast implant removal 03/07/2019. EXAM: PORTABLE CHEST 1 VIEW COMPARISON:  PA and lateral chest 08/26/2017. FINDINGS: Breast implants have been removed. Bilateral surgical drains are in place. The lungs are clear with emphysematous disease noted. No pneumothorax or pleural fluid. No acute or focal bony abnormality. Atherosclerosis is noted. IMPRESSION: No acute disease. Status post breast implant removal with bilateral surgical drains in place. Emphysema. Atherosclerosis. Electronically Signed   By: Inge Rise M.D.   On: 03/09/2019 09:22     Time Spent in minutes  30     Desiree Hane M.D on 03/11/2019 at 12:39 PM  To page go to [email or web address] - password [facility]

## 2019-03-11 NOTE — Progress Notes (Signed)
SLP Cancellation Note  Patient Details Name: KITA HUNEKE MRN: [id number] DOB: 05/17/1950   Cancelled treatment:       Reason Eval/Treat Not Completed: Medical issues which prohibited therapy;Patient declined, no reason specified; pt refusing POs currently; awaiting ENT consultation.   Elvina Sidle, M.S., CCC-SLP 03/11/2019, 2:10 PM

## 2019-03-11 NOTE — Progress Notes (Signed)
DAILY PROGRESS NOTE   Patient Name: [name] Date of Encounter: 03/11/2019 Cardiologist: Jenkins Rouge, MD  Chief Complaint   Neck is swollen, cannot swallow pills  Patient Profile   [name] is a 68 y.o. female with a hx of HTN, LBBB, CKD III, OCD, COPD/emphysema, migraines, OSA not on CPAP, who is being seen today for the evaluation of elevated troponin at the request of Dr Teryl Lucy.  Dr Johnsie Cancel last saw her in 2017, preop for hernia surgery. Echo and MV normal.  Subjective   Troponin trending down - demand ischemia suspected. Echo yesterday is reassuring, LVEF normal with normal wall motion and mild diastolic dysfunction. No chest pain. Main complaint is cannot swallow due to "Swelling in her throat" - asking about steroids. Concerned about withdrawal from not getting her po meds.  Objective   Vitals:   03/10/19 2203 03/11/19 0300 03/11/19 0741 03/11/19 0801  BP: (!) 157/95   120/71  Pulse: 68   (!) 56  Resp: 16  12   Temp: 98.1 F (36.7 C)   97.7 F (36.5 C)  TempSrc: Oral   Oral  SpO2: 99%   99%  Weight:  69.5 kg    Height:        Intake/Output Summary (Last 24 hours) at 03/11/2019 1124 Last data filed at 03/11/2019 0700 Gross per 24 hour  Intake -  Output 905 ml  Net -905 ml   Filed Weights   03/09/19 1854 03/10/19 0500 03/11/19 0300  Weight: 65.4 kg 65.8 kg 69.5 kg    Physical Exam   General appearance: alert and no distress Neck: JVD - 3 cm above sternal notch, no carotid bruit, supple, symmetrical, trachea midline and thyroid not enlarged, symmetric, no tenderness/mass/nodules Lungs: diminished breath sounds bilaterally and rhonchi bilaterally Abdomen: soft, non-tender; bowel sounds normal; no masses,  no organomegaly Extremities: extremities normal, atraumatic, no cyanosis or edema Neurologic: Mental status: Alert, oriented, thought content appropriate  Inpatient Medications    Scheduled Meds: . aspirin EC  81 mg Oral Daily  .  atenolol  100 mg Oral Daily  . buPROPion  450 mg Oral Daily  . enoxaparin (LOVENOX) injection  40 mg Subcutaneous Q24H  . famotidine  20 mg Oral QHS  . guaiFENesin  1,200 mg Oral BID  . hyoscyamine  0.375 mg Oral BID  . levothyroxine  125 mcg Oral Q0600  . mirabegron ER  25 mg Oral Daily  . sertraline  100 mg Oral Daily    Continuous Infusions: . sodium chloride 100 mL/hr at 03/11/19 0305  . levofloxacin (LEVAQUIN) IV      PRN Meds: acetaminophen **OR** acetaminophen, albuterol, ALPRAZolam, morphine injection, ondansetron **OR** ondansetron (ZOFRAN) IV, oxyCODONE, phenol   Labs   Results for orders placed or performed during the hospital encounter of 03/09/19 (from the past 48 hour(s))  Troponin I (High Sensitivity)     Status: Abnormal   Collection Time: 03/09/19 11:40 AM  Result Value Ref Range   Troponin I (High Sensitivity) 808 (HH) <18 ng/L    Comment: CRITICAL VALUE NOTED.  VALUE IS CONSISTENT WITH PREVIOUSLY REPORTED AND CALLED VALUE. (NOTE) Elevated high sensitivity troponin I (hsTnI) values and significant  changes across serial measurements may suggest ACS but many other  chronic and acute conditions are known to elevate hsTnI results.  Refer to the Links section for chest pain algorithms and additional  guidance. Performed at [facility], [place] [address] [place], [place] [address]  Troponin I (High Sensitivity)     Status: Abnormal   Collection Time: 03/09/19  2:30 PM  Result Value Ref Range   Troponin I (High Sensitivity) 709 (HH) <18 ng/L    Comment: CRITICAL VALUE NOTED.  VALUE IS CONSISTENT WITH PREVIOUSLY REPORTED AND CALLED VALUE. (NOTE) Elevated high sensitivity troponin I (hsTnI) values and significant  changes across serial measurements may suggest ACS but many other  chronic and acute conditions are known to elevate hsTnI results.  Refer to the Links section for chest pain algorithms and additional  guidance. Performed at [facility], [place] [address], [place], [place] [address]   Troponin I (High Sensitivity)     Status: Abnormal   Collection Time: 03/09/19  5:26 PM  Result Value Ref Range   Troponin I (High Sensitivity) 608 (HH) <18 ng/L    Comment: CRITICAL VALUE NOTED.  VALUE IS CONSISTENT WITH PREVIOUSLY REPORTED AND CALLED VALUE. (NOTE) Elevated high sensitivity troponin I (hsTnI) values and significant  changes across serial measurements may suggest ACS but many other  chronic and acute conditions are known to elevate hsTnI results.  Refer to the Links section for chest pain algorithms and additional  guidance. Performed at [facility], [place] [address], [place], [place] [id number]   Basic metabolic panel     Status: Abnormal   Collection Time: 03/10/19  3:33 AM  Result Value Ref Range   Sodium 141 135 - 145 mmol/L   Potassium 4.5 3.5 - 5.1 mmol/L   Chloride 105 98 - 111 mmol/L   CO2 28 22 - 32 mmol/L   Glucose, Bld 96 70 - 99 mg/dL   BUN 21 8 - 23 mg/dL   Creatinine, Ser 1.09 (H) 0.44 - 1.00 mg/dL   Calcium 8.8 (L) 8.9 - 10.3 mg/dL   GFR calc non Af Amer 52 (L) >60 mL/min   GFR calc Af Amer >60 >60 mL/min   Anion gap 8 5 - 15    Comment: Performed at [facility], [place] [address], [place], [place] [address]  CBC     Status: Abnormal   Collection Time: 03/10/19  3:33 AM  Result Value Ref Range   WBC 5.8 4.0 - 10.5 K/uL   RBC 3.32 (L) 3.87 - 5.11 MIL/uL   Hemoglobin 10.6 (L) 12.0 - 15.0 g/dL   HCT 32.6 (L) 36.0 - 46.0 %   MCV 98.2 80.0 - 100.0 fL   MCH 31.9 26.0 - 34.0 pg   MCHC 32.5 30.0 - 36.0 g/dL   RDW 12.8 11.5 - 15.5 %   Platelets 188 150 - 400 K/uL   nRBC 0.0 0.0 - 0.2 %    Comment: Performed at [facility], [place] [address], [place], [place] [address]  Urinalysis, Routine w reflex microscopic     Status: None   Collection Time: 03/10/19  9:19 AM  Result Value Ref Range   Color, Urine YELLOW YELLOW   APPearance CLEAR CLEAR   Specific Gravity, Urine 1.020 1.005 - 1.030    pH 5.0 5.0 - 8.0   Glucose, UA NEGATIVE NEGATIVE mg/dL   Hgb urine dipstick NEGATIVE NEGATIVE   Bilirubin Urine NEGATIVE NEGATIVE   Ketones, ur NEGATIVE NEGATIVE mg/dL   Protein, ur NEGATIVE NEGATIVE mg/dL   Nitrite NEGATIVE NEGATIVE   Leukocytes,Ua NEGATIVE NEGATIVE    Comment: Performed at [facility] [address], [place], [place] [address]  Urine culture  Status: None   Collection Time: 03/10/19  9:19 AM   Specimen: Urine, Clean Catch  Result Value Ref Range   Specimen Description URINE, CLEAN CATCH    Special Requests NONE    Culture      NO GROWTH Performed at [facility], [place] [address], [place], [place] [address]    Report Status 03/11/2019 FINAL   Iron and TIBC     Status: None   Collection Time: 03/10/19 12:50 PM  Result Value Ref Range   Iron 50 28 - 170 ug/dL   TIBC 300 250 - 450 ug/dL   Saturation Ratios 17 10.4 - 31.8 %   UIBC 250 ug/dL    Comment: Performed at [facility], [place] [address] [place], [place] [address]  Ferritin     Status: None   Collection Time: 03/10/19 12:50 PM  Result Value Ref Range   Ferritin 32 11 - 307 ng/mL    Comment: Performed at [facility], [place] [address], [place], [place] [address]  Procalcitonin     Status: None   Collection Time: 03/11/19  4:10 AM  Result Value Ref Range   Procalcitonin <0.10 ng/mL    Comment:        Interpretation: PCT (Procalcitonin) <= 0.5 ng/mL: Systemic infection (sepsis) is not likely. Local bacterial infection is possible. (NOTE)       Sepsis PCT Algorithm           Lower Respiratory Tract                                      Infection PCT Algorithm    ----------------------------     ----------------------------         PCT < 0.25 ng/mL                PCT < 0.10 ng/mL         Strongly encourage             Strongly discourage   discontinuation of antibiotics    initiation of antibiotics    ----------------------------     -----------------------------       PCT  0.25 - 0.50 ng/mL            PCT 0.10 - 0.25 ng/mL               OR       >80% decrease in PCT            Discourage initiation of                                            antibiotics      Encourage discontinuation           of antibiotics    ----------------------------     -----------------------------         PCT >= 0.50 ng/mL              PCT 0.26 - 0.50 ng/mL               AND        <80% decrease in PCT             Encourage initiation of  antibiotics       Encourage continuation           of antibiotics    ----------------------------     -----------------------------        PCT >= 0.50 ng/mL                  PCT > 0.50 ng/mL               AND         increase in PCT                  Strongly encourage                                      initiation of antibiotics    Strongly encourage escalation           of antibiotics                                     -----------------------------                                           PCT <= 0.25 ng/mL                                                 OR                                        > 80% decrease in PCT                                     Discontinue / Do not initiate                                             antibiotics Performed at [facility], [address], [place], [place] [address]   CBC     Status: Abnormal   Collection Time: 03/11/19  4:10 AM  Result Value Ref Range   WBC 6.5 4.0 - 10.5 K/uL   RBC 3.41 (L) 3.87 - 5.11 MIL/uL   Hemoglobin 10.9 (L) 12.0 - 15.0 g/dL   HCT 34.4 (L) 36.0 - 46.0 %   MCV 100.9 (H) 80.0 - 100.0 fL   MCH 32.0 26.0 - 34.0 pg   MCHC 31.7 30.0 - 36.0 g/dL   RDW 13.2 11.5 - 15.5 %   Platelets 197 150 - 400 K/uL   nRBC 0.0 0.0 - 0.2 %    Comment: Performed at [facility], [place] [address] [place], [place] [address]  Comprehensive metabolic panel     Status: Abnormal   Collection Time: 03/11/19  4:10 AM  Result Value Ref Range    Sodium 139 135 - 145 mmol/L   Potassium 3.7 3.5 -  5.1 mmol/L   Chloride 104 98 - 111 mmol/L   CO2 24 22 - 32 mmol/L   Glucose, Bld 81 70 - 99 mg/dL   BUN 23 8 - 23 mg/dL   Creatinine, Ser 1.14 (H) 0.44 - 1.00 mg/dL   Calcium 8.5 (L) 8.9 - 10.3 mg/dL   Total Protein 5.5 (L) 6.5 - 8.1 g/dL   Albumin 2.7 (L) 3.5 - 5.0 g/dL   AST 114 (H) 15 - 41 U/L   ALT 163 (H) 0 - 44 U/L   Alkaline Phosphatase 72 38 - 126 U/L   Total Bilirubin 0.9 0.3 - 1.2 mg/dL   GFR calc non Af Amer 50 (L) >60 mL/min   GFR calc Af Amer 58 (L) >60 mL/min   Anion gap 11 5 - 15    Comment: Performed at [facility], [address], [place], [place] [address]    ECG   N/A - Personally Reviewed  Telemetry   Sinus rhythm - Personally Reviewed  Radiology    No results found.  Cardiac Studies   Procedure: 2D Echo  Indications:    Abnormal ECG 794.31 / R94.31   History:        Patient has prior history of Echocardiogram examinations, most                 recent 06/28/2015. COPD, Arrythmias:LBBB; Risk                 Factors:Hypertension, Sleep Apnea and Former Smoker. Chronic                 kidney disease, bronchitis/ pneumonia after aspiration. Had                 recent surgery.   Sonographer:    Darlina Sicilian RDCS Referring Phys: [id number] [place]    1. Left ventricular ejection fraction, by visual estimation, is 55 to 60%. The left ventricle has normal function. There is no left ventricular hypertrophy.  2. Abnormal septal motion consistent with left bundle branch block.  3. Left ventricular diastolic parameters are consistent with Grade I diastolic dysfunction (impaired relaxation).  4. The left ventricle has no regional wall motion abnormalities.  5. Global right ventricle has normal systolic function.The right ventricular size is moderately enlarged. No increase in right ventricular wall thickness.  6. Left atrial size was normal.  7. Right atrial size was mildly  dilated.  8. The mitral valve is normal in structure. No evidence of mitral valve regurgitation.  9. The tricuspid valve is grossly normal. Tricuspid valve regurgitation mild-moderate. 10. The aortic valve is grossly normal. Aortic valve regurgitation is not visualized. 11. The pulmonic valve was not assessed. Pulmonic valve regurgitation is not visualized. 12. The aortic root was not well visualized. 13. Mildly elevated pulmonary artery systolic pressure. 14. The inferior vena cava is dilated in size with <50% respiratory variability, suggesting right atrial pressure of 15 mmHg. 15. Technically limited study. Only subcostal views were obtained  Assessment   1. Principal Problem: 2.   Pneumonia due to infectious agent 3. Active Problems: 4.   Hypothyroidism 5.   ADHD (attention deficit hyperactivity disorder) 6.   HTN (hypertension) 7.   CKD Stage 3 ([place]) 8.   Post-operative state 9.   AKI (acute kidney injury) ([place]) 10.   Odynophagia 11.   Plan   Echo re-assuring. Normal RV and LV function, RVSP mildly elevated with elevated RA pressure, likely explains JVP.  BNP 418. May be diastolic CHF. Would d/c IVF's as renal function is normal. Consider single dose lasix. No chest pain. Recommend outpatient cardiology follow-up and lexiscan myoview once she has recovered from her pneumonia. On ASA, BB, no statin -however, liver enzymes are elevated.  [facility] will sign off.   Medication Recommendations:  As above Other recommendations (labs, testing, etc):  [facility] Follow up as an outpatient:  Dr. Johnsie Cancel or APP at [facility]  Time Spent Directly with Patient:  I have spent a total of 25 minutes with the patient reviewing hospital notes, telemetry, EKGs, labs and examining the patient as well as establishing an assessment and plan that was discussed personally with the patient.  > 50% of time was spent in direct patient care.  Length of Stay:  LOS: 2 days   Pixie Casino, MD, [facility], [place] Director of the Advanced Lipid Disorders &  Cardiovascular Risk Reduction Clinic Diplomate of the American Board of Clinical Lipidology Attending Cardiologist  Direct Dial: [phone]  Fax: [phone]  Website:  [email or web address] 03/11/2019, 11:24 AM

## 2019-03-11 NOTE — Progress Notes (Signed)
MD made aware pt refuses po meds at this time d/t difficulty swallowing.

## 2019-03-11 NOTE — H&P (View-Only) (Signed)
Consult Note for  [facility]  Reason for Consult: Dysphagia and odynophagia Referring Physician: [facility]  [name] HPI: [name] with a PMH of COPD and CKD who started to experience dysphagia and odynophagia one day after her breast implant removal surgery.  She had the breast implants removed as they were leaking silicone and they were implanted 40 years ago.  She started to have dysphagia and fever one day after her surgery.  Since that time she was not able to tolerate any PO.  The patient complained about having cough, odynophagia and neck swelling.  Her inability to tolerate any oral intake is the reasion she presented to the ER.  In the ER her temperature was noted to be 102.2, but her WBC was normal as well as the neck imaging.  There were no reports that she was a difficult intubation.  Past Medical History:  Diagnosis Date  . Chronic kidney disease (CKD), stage III (moderate)   . Diverticulosis   . Emphysema lung ([place])   . Helicobacter pylori (H. pylori)   . IBS (irritable bowel syndrome)   . LBBB (left bundle branch block)   . Lower urinary tract infection 06/21/2017  . Migraines   . OCD (obsessive compulsive disorder)   . Prediabetes    pcp  . Renal disorder    AKI due to lisinpril, his. pyelonephritis  . Sepsis ([place])   . Sleep apnea    no CPAP  . Vitamin D deficiency     Past Surgical History:  Procedure Laterality Date  . AUGMENTATION MAMMAPLASTY    . BREAST ENHANCEMENT SURGERY    . BREAST IMPLANT REMOVAL Bilateral 03/07/2019  . BUNIONECTOMY     Bil  . COLON SURGERY     colonoscopy  . HEMORRHOID SURGERY    . OOPHORECTOMY    . ORIF ANKLE FRACTURE Left 08/30/2017   Procedure: OPEN REDUCTION INTERNAL FIXATION (ORIF) ANKLE FRACTURE;  Surgeon: Rod Can, MD;  Location: [facility];  Service: Orthopedics;  Laterality: Left;  . TONSILLECTOMY      Family History  Problem Relation Age of Onset  . Colon polyps Mother   . Colon cancer  Neg Hx     Social History:  reports that she quit smoking about 16 years ago. Her smoking use included cigarettes. She has a 40.00 pack-year smoking history. She has never used smokeless tobacco. She reports that she does not drink alcohol or use drugs.  Allergies:  Allergies  Allergen Reactions  . Ace Inhibitors Other (See Comments)    Acute renal failure (lisinopril)  . Cephalexin Anaphylaxis  . Dilaudid [Hydromorphone Hcl]   . Macrobid [Nitrofurantoin] Other (See Comments)    Causes hyperthermia   . Robaxin [Methocarbamol]   . Codeine Nausea Only  . Latex Rash  . Sulfa Antibiotics Other (See Comments)    Do not take per nephrology    Medications:  Scheduled: . aspirin EC  81 mg Oral Daily  . atenolol  100 mg Oral Daily  . buPROPion  450 mg Oral Daily  . enoxaparin (LOVENOX) injection  40 mg Subcutaneous Q24H  . famotidine  20 mg Oral QHS  . guaiFENesin  1,200 mg Oral BID  . hyoscyamine  0.375 mg Oral BID  . levothyroxine  100 mcg Intravenous Daily  . mirabegron ER  25 mg Oral Daily  . sertraline  100 mg Oral Daily   Continuous:   Results for orders placed or performed during the  hospital encounter of 03/09/19 (from the past 24 hour(s))  Procalcitonin     Status: None   Collection Time: 03/11/19  4:10 AM  Result Value Ref Range   Procalcitonin <0.10 ng/mL  CBC     Status: Abnormal   Collection Time: 03/11/19  4:10 AM  Result Value Ref Range   WBC 6.5 4.0 - 10.5 K/uL   RBC 3.41 (L) 3.87 - 5.11 MIL/uL   Hemoglobin 10.9 (L) 12.0 - 15.0 g/dL   HCT 34.4 (L) 36.0 - 46.0 %   MCV 100.9 (H) 80.0 - 100.0 fL   MCH 32.0 26.0 - 34.0 pg   MCHC 31.7 30.0 - 36.0 g/dL   RDW 13.2 11.5 - 15.5 %   Platelets 197 150 - 400 K/uL   nRBC 0.0 0.0 - 0.2 %  Comprehensive metabolic panel     Status: Abnormal   Collection Time: 03/11/19  4:10 AM  Result Value Ref Range   Sodium 139 135 - 145 mmol/L   Potassium 3.7 3.5 - 5.1 mmol/L   Chloride 104 98 - 111 mmol/L   CO2 24 22 - 32  mmol/L   Glucose, Bld 81 70 - 99 mg/dL   BUN 23 8 - 23 mg/dL   Creatinine, Ser 1.14 (H) 0.44 - 1.00 mg/dL   Calcium 8.5 (L) 8.9 - 10.3 mg/dL   Total Protein 5.5 (L) 6.5 - 8.1 g/dL   Albumin 2.7 (L) 3.5 - 5.0 g/dL   AST 114 (H) 15 - 41 U/L   ALT 163 (H) 0 - 44 U/L   Alkaline Phosphatase 72 38 - 126 U/L   Total Bilirubin 0.9 0.3 - 1.2 mg/dL   GFR calc non Af Amer 50 (L) >60 mL/min   GFR calc Af Amer 58 (L) >60 mL/min   Anion gap 11 5 - 15     No results found.  ROS:  As stated above in the HPI otherwise negative.  Blood pressure 120/71, pulse (!) 56, temperature 97.7 F (36.5 C), temperature source Oral, resp. rate 12, height 5\' 3"  (1.6 m), weight 69.5 kg, SpO2 99 %.    PE: Gen: NAD, Alert and Oriented HEENT:  [place]/AT, EOMI Neck: Supple, no LAD Lungs: CTA Bilaterally CV: RRR without M/G/R ABM: Soft, NTND, +BS Ext: No C/C/E  Assessment/Plan: 1) Dysphagia. 2) Odynophagia. 3) Nausea/vomiting. 4) COPD.   The source of her symptoms are not clear.  I will perform an EGD tomorrow for further evaluation.  Plan: 1) EGD tomorrow.  [name] D 03/11/2019, 4:37 PM

## 2019-03-12 ENCOUNTER — Inpatient Hospital Stay (HOSPITAL_COMMUNITY): Payer: PPO | Admitting: Anesthesiology

## 2019-03-12 ENCOUNTER — Encounter (HOSPITAL_COMMUNITY)
Admission: EM | Disposition: A | Discharge status: Home / Self Care | Payer: Self-pay | Source: Home / Self Care | Attending: Internal Medicine

## 2019-03-12 ENCOUNTER — Encounter (HOSPITAL_COMMUNITY): Payer: Self-pay

## 2019-03-12 DIAGNOSIS — J029 Acute pharyngitis, unspecified: Secondary | ICD-10-CM

## 2019-03-12 HISTORY — PX: ESOPHAGOGASTRODUODENOSCOPY (EGD) WITH PROPOFOL: SHX5813

## 2019-03-12 LAB — COMPREHENSIVE METABOLIC PANEL
ALT: 203 U/L — ABNORMAL HIGH (ref 0–44)
AST: 122 U/L — ABNORMAL HIGH (ref 15–41)
Albumin: 2.8 g/dL — ABNORMAL LOW (ref 3.5–5.0)
Alkaline Phosphatase: 73 U/L (ref 38–126)
Anion gap: 13 (ref 5–15)
BUN: 22 mg/dL (ref 8–23)
CO2: 26 mmol/L (ref 22–32)
Calcium: 8.8 mg/dL — ABNORMAL LOW (ref 8.9–10.3)
Chloride: 101 mmol/L (ref 98–111)
Creatinine, Ser: 1.07 mg/dL — ABNORMAL HIGH (ref 0.44–1.00)
GFR calc Af Amer: >60 mL/min (ref >60)
GFR calc non Af Amer: 54 mL/min — ABNORMAL LOW (ref >60)
Glucose, Bld: 72 mg/dL (ref 70–99)
Potassium: 3.4 mmol/L — ABNORMAL LOW (ref 3.5–5.1)
Sodium: 140 mmol/L (ref 135–145)
Total Bilirubin: 1.1 mg/dL (ref 0.3–1.2)
Total Protein: 5.6 g/dL — ABNORMAL LOW (ref 6.5–8.1)

## 2019-03-12 LAB — PROCALCITONIN: Procalcitonin: <0.1 ng/mL

## 2019-03-12 SURGERY — ESOPHAGOGASTRODUODENOSCOPY (EGD) WITH PROPOFOL
Anesthesia: Monitor Anesthesia Care

## 2019-03-12 MED ORDER — OXYMETAZOLINE HCL 0.05 % NA SOLN
1.0000 | Freq: Once | NASAL | Status: DC | PRN
  Filled 2019-03-12: qty 30

## 2019-03-12 MED ORDER — SILVER NITRATE-POT NITRATE 75-25 % EX MISC
1.0000 | Freq: Once | CUTANEOUS | Status: DC | PRN
  Filled 2019-03-12: qty 1

## 2019-03-12 MED ORDER — LIDOCAINE HCL 2 % EX GEL
1.0000 "application " | Freq: Once | CUTANEOUS | Status: DC | PRN
  Filled 2019-03-12: qty 4250

## 2019-03-12 MED ORDER — LACTATED RINGERS IV SOLN
INTRAVENOUS | Status: DC
  Administered 2019-03-12: 09:00:00 via INTRAVENOUS

## 2019-03-12 MED ORDER — HYOSCYAMINE SULFATE 0.125 MG SL SUBL
0.2500 mg | SUBLINGUAL_TABLET | Freq: Three times a day (TID) | SUBLINGUAL | Status: DC
  Administered 2019-03-12 – 2019-03-14 (×5): 0.25 mg via SUBLINGUAL
  Filled 2019-03-12 (×7): qty 2

## 2019-03-12 MED ORDER — TRIPLE ANTIBIOTIC 3.5-400-5000 EX OINT
1.0000 "application " | TOPICAL_OINTMENT | Freq: Once | CUTANEOUS | Status: DC | PRN
  Filled 2019-03-12: qty 1

## 2019-03-12 MED ORDER — ONDANSETRON HCL 4 MG/2ML IJ SOLN
INTRAMUSCULAR | Status: DC | PRN
  Administered 2019-03-12: 4 mg via INTRAVENOUS

## 2019-03-12 MED ORDER — PROPOFOL 500 MG/50ML IV EMUL
INTRAVENOUS | Status: DC | PRN
  Administered 2019-03-12: 75 ug/kg/min via INTRAVENOUS

## 2019-03-12 MED ORDER — LIDOCAINE VISCOUS HCL 2 % MT SOLN
15.0000 mL | Freq: Four times a day (QID) | OROMUCOSAL | Status: DC
  Administered 2019-03-13: 15 mL via OROMUCOSAL
  Filled 2019-03-12 (×5): qty 15

## 2019-03-12 MED ORDER — LIDOCAINE-EPINEPHRINE (PF) 1 %-1:200000 IJ SOLN
0.0000 mL | Freq: Once | INTRAMUSCULAR | Status: DC | PRN
  Filled 2019-03-12: qty 30

## 2019-03-12 MED ORDER — PROPOFOL 10 MG/ML IV BOLUS
INTRAVENOUS | Status: DC | PRN
  Administered 2019-03-12 (×3): 10 mg via INTRAVENOUS

## 2019-03-12 MED ORDER — LIDOCAINE HCL 4 % EX SOLN
0.0000 mL | Freq: Once | CUTANEOUS | Status: DC | PRN
  Filled 2019-03-12: qty 50

## 2019-03-12 MED ORDER — LIDOCAINE HCL URETHRAL/MUCOSAL 2 % EX GEL
1.0000 "application " | Freq: Once | CUTANEOUS | Status: DC | PRN
  Filled 2019-03-12: qty 5

## 2019-03-12 SURGICAL SUPPLY — 15 items

## 2019-03-12 NOTE — Op Note (Signed)
[facility] Patient Name: [name] Procedure Date : 03/12/2019 MRN: [id number] Attending MD: Carol Ada , MD Date of Birth: [birth year] CSN: [id number] Age: 68 Admit Type: Inpatient Procedure:                Upper GI endoscopy Indications:              Odynophagia Providers:                Carol Ada, MD, Glori Bickers, RN, Laverda Sorenson,                            Technician, Eligha Bridegroom CRNA, CRNA Referring MD:              Medicines:                Propofol per Anesthesia Complications:            No immediate complications. Estimated Blood Loss:     Estimated blood loss: none. Procedure:                Pre-Anesthesia Assessment:                           - Prior to the procedure, a History and Physical                            was performed, and patient medications and                            allergies were reviewed. The patient's tolerance of                            previous anesthesia was also reviewed. The risks                            and benefits of the procedure and the sedation                            options and risks were discussed with the patient.                            All questions were answered, and informed consent                            was obtained. Prior Anticoagulants: The patient has                            taken no previous anticoagulant or antiplatelet                            agents. ASA Grade Assessment: II - A patient with                            mild systemic disease. After reviewing the risks  and benefits, the patient was deemed in                            satisfactory condition to undergo the procedure.                           - Sedation was administered by an anesthesia                            professional. Deep sedation was attained.                           After obtaining informed consent, the endoscope was                            passed under direct vision.  Throughout the                            procedure, the patient's blood pressure, pulse, and                            oxygen saturations were monitored continuously. The                            [id number]) Olympus gastroscope was                            introduced through the mouth, and advanced to the                            second part of duodenum. The upper GI endoscopy was                            accomplished without difficulty. The patient                            tolerated the procedure well. Scope In: Scope Out: Findings:      Localized moderate inflammation characterized by congestion (edema) and       shallow ulcerations was found at the cricopharyngeus.      The stomach was normal.      The examined duodenum was normal.      The source of the patient's dysphagia/odynophagia was localized to the       cricopharyngeous. The are was edematous and there was exudate. Some mild       friability was noted. A clear image was not possible as the patient       struggled with examination of this area. A clear ulceration was not       clearly identified. Impression:               - Esophageal mucosal changes were present,                            including congestion (edema) and shallow  ulcerations. Findings are suggestive of                            inflammation.                           - Normal stomach.                           - Normal examined duodenum.                           - No specimens collected. Recommendation:           - Return patient to hospital ward for ongoing care.                           - Clear liquid diet.                           - Continue present medications.                           - ENT consultation. Procedure Code(s):        --- Professional ---                           [phone], Esophagogastroduodenoscopy, flexible,                            transoral; diagnostic, including collection of                             specimen(s) by brushing or washing, when performed                            (separate procedure) Diagnosis Code(s):        --- Professional ---                           K22.8, Other specified diseases of esophagus                           K22.10, Ulcer of esophagus without bleeding                           R13.10, Dysphagia, unspecified CPT copyright 2019 American Medical Association. All rights reserved. The codes documented in this report are preliminary and upon coder review may  be revised to meet current compliance requirements. Carol Ada, MD Carol Ada, MD 03/12/2019 10:24:07 AM This report has been signed electronically. Number of Addenda: 0

## 2019-03-12 NOTE — Anesthesia Procedure Notes (Signed)
Procedure Name: MAC Date/Time: 03/12/2019 10:05 AM Performed by: Eligha Bridegroom, CRNA Pre-anesthesia Checklist: Patient identified, Emergency Drugs available, Suction available, Patient being monitored and Timeout performed Patient Re-evaluated:Patient Re-evaluated prior to induction Oxygen Delivery Method: Nasal cannula Preoxygenation: Pre-oxygenation with 100% oxygen Induction Type: IV induction

## 2019-03-12 NOTE — Anesthesia Preprocedure Evaluation (Addendum)
Anesthesia Evaluation  Patient identified by MRN, date of birth, ID band Patient awake    Reviewed: Allergy & Precautions, NPO status , Patient's Chart, lab work & pertinent test results  Airway Mallampati: II  TM Distance: >3 FB Neck ROM: Full    Dental no notable dental hx.    Pulmonary sleep apnea , COPD, former smoker,    Pulmonary exam normal breath sounds clear to auscultation       Cardiovascular hypertension, Pt. on home beta blockers +CHF  Normal cardiovascular exam+ dysrhythmias  Rhythm:Regular Rate:Normal  ECG: SR, rate 82  ECHO: Left ventricular ejection fraction, by visual estimation, is 55 to 60%. The left ventricle has normal function. There is no left ventricular hypertrophy. 2. Abnormal septal motion consistent with left bundle branch block. 3. Left ventricular diastolic parameters are consistent with Grade I diastolic dysfunction (impaired relaxation). 4. The left ventricle has no regional wall motion abnormalities. 5. Global right ventricle has normal systolic function.The right ventricular size is moderately enlarged. No increase in right ventricular wall thickness. 6. Left atrial size was normal. 7. Right atrial size was mildly dilated. 8. The mitral valve is normal in structure. No evidence of mitral valve regurgitation. 9. The tricuspid valve is grossly normal. Tricuspid valve regurgitation mild-moderate. 10. The aortic valve is grossly normal. Aortic valve regurgitation is not visualized. 11. The pulmonic valve was not assessed. Pulmonic valve regurgitation is not visualized. 12. The aortic root was not well visualized. 13. Mildly elevated pulmonary artery systolic pressure. 14. The inferior vena cava is dilated in size with <50% respiratory variability, suggesting right atrial pressure of 15 mmHg. 15. Technically limited study. Only subcostal views were obtained.   Neuro/Psych  Headaches,  PSYCHIATRIC DISORDERS Anxiety Depression OCD (obsessive compulsive disorder)   GI/Hepatic negative GI ROS, Neg liver ROS, IBS (irritable bowel syndrome)   Endo/Other  Hypothyroidism   Renal/GU Renal disease     Musculoskeletal negative musculoskeletal ROS (+)   Abdominal   Peds  Hematology  (+) anemia ,   Anesthesia Other Findings Dysphagia and odynophagia  Reproductive/Obstetrics                            Anesthesia Physical Anesthesia Plan  ASA: III  Anesthesia Plan: MAC   Post-op Pain Management:    Induction: Intravenous  PONV Risk Score and Plan: 2 and Propofol infusion and Treatment may vary due to age or medical condition  Airway Management Planned: Nasal Cannula  Additional Equipment:   Intra-op Plan:   Post-operative Plan:   Informed Consent: I have reviewed the patients History and Physical, chart, labs and discussed the procedure including the risks, benefits and alternatives for the proposed anesthesia with the patient or authorized representative who has indicated his/her understanding and acceptance.     Dental advisory given  Plan Discussed with: CRNA  Anesthesia Plan Comments:         Anesthesia Quick Evaluation

## 2019-03-12 NOTE — Progress Notes (Signed)
PROGRESS NOTE    [name]  [id number] DOB: May 09, 1950 DOA: 03/09/2019 PCP: Unk [name], MD  Outpatient Specialists:     Brief Narrative:  Patient is a 68 year old Caucasian female with past medical history significant for COPD with emphysema, chronic left bundle branch block and CKD stage III with baseline serum creatinine of 1.09.  Apparently, patient underwent removal of breast implants on 03/08/19.  The procedure was done under general anesthesia, and patient was intubated.  Subsequently, patient developed worsening facial and neck swelling, vomiting, difficulty/pain on swallowing, and cough productive of white sputum.  On presentation to the emergency room on 03/09/2019, patient was found to have fever of 102 degrees Fahrenheit, oxygen desaturation of 79% on room air requiring 2 L nasal cannula otherwise hemodynamically stable.  High-sensitivity troponin of 622, CO2 on ABG of 56, lactic acid of 2.2, potassium 5.3, creatinine 2.03, BNP 418.  CT chest showed evidence of chronic emphysema with mucous plugging but no evidence of pneumonia or pulmonary edema.  CT neck showed no acute abnormalities.  Patient was admitted for further assessment and management.  Patient has been on broad-spectrum antibiotics.  Cardiology team was consulted and cardiac findings were thought to be demand ischemia.  Patient will follow with cardiology on discharge.   During the hospital stay, facial and neck swelling have continued to improve.  However, patient continued to report significant pain on swallowing.  Patient underwent EGD today, 03/12/2019 that revealed moderate inflammation characterized by congestion and shallow ulceration involving the cortical pharyngeal area.  ENT, Dr. Constance Holster has been consulted.  ENT consulted and will see patient in the morning.  Meanwhile, will start patient on viscous lidocaine 15 mils every 6 hourly for now.  We will also check herpes 1 and 2 PCR.  Further management depend  on hospital course  Assessment & Plan:   Principal Problem:   Pneumonia due to infectious agent Active Problems:   Hypothyroidism   ADHD (attention deficit hyperactivity disorder)   HTN (hypertension)   CKD Stage 3 (HCC)   Post-operative state   AKI (acute kidney injury) ([place])   Odynophagia   Dysphagia   Transaminitis   CHF NYHA class II, acute, diastolic (HCC)  Dysphagia and odynophagia: -Likely secondary to cricopharyngeal inflammation.   -Patient underwent intubation about 3 days ago.   -Start viscous lidocaine S/S 15 mils every 6 hourly -Check herpes 1 and 2 PCR. -ENT, Dr. Constance Holster consulted.   -Further management will depend on hospital course   Cricopharyngeal inflammation:  -Please see above.   -History of present inflammation  -Lidocaine as above  -Follow herpes PCR as documented above.    Acute hypoxic respiratory failure: -Resolved. -CT chest done on presentation revealed bilateral areas of segmental bronchus mucous plugging, as well as, mild chronic centrilobular and paraseptal emphysema. -CT soft tissue of the neck did not reveal any significant findings, but was suboptimal study due to artifact.   -Doubt pneumonic process.   -Facial and neck swelling was also reported on presentation, but improved significantly  -We will continue to manage patient expectantly.    Elevated troponin: -Cardiology input appreciated -Demand ischemia entertained. -No cardiac symptoms present. -Follow with cardiology on discharge for further work-up.  Acute kidney injury on chronic kidney disease stage III:  -AKI has resolved.   -Possibly prerenal.   -Patient presented with odynophagia and dysphagia, therefore, may have had poor p.o. intake.    Status post removal of breast implant on 03/08/2019.   Transaminitis: -Continue to monitor  closely. -AST was 122 today  -ALT was 203 today.  Grade 1 diastolic CHF: -Stable. -No current CHF symptoms. -IV fluid has been  discontinued.   SIRS: -Resolved.   Lactic acidosis: -Resolved.  Peaked at 2.2.  Chronic Emphysema: -No exacerbation noted today.  -Recent mucous plugging noted.  -Continue current management.    Normocytic anemia: -Continue to monitor.  Hypertension: -Controlled.  DVT prophylaxis: Subcutaneous Lovenox Code Status: Full code Family Communication:  Disposition Plan: Home eventually  Consultants:   GI  ENT  Procedures:   EGD revealed cricopharyngeal inflammation  Antimicrobials:   None   Subjective: Patient continues to report pain on swallowing.  Objective: Vitals:   03/12/19 0814 03/12/19 0904 03/12/19 1023 03/12/19 1035  BP: 136/78 (!) 156/83 (!) 107/50 127/85  Pulse: (!) 55 (!) 57 74 66  Resp:  12 14 15   Temp: 98.8 F (37.1 C) 98.9 F (37.2 C) 99 F (37.2 C)   TempSrc: Oral Oral Temporal   SpO2: 99% 99% 97% 99%  Weight:      Height:        Intake/Output Summary (Last 24 hours) at 03/12/2019 1151 Last data filed at 03/12/2019 1015 Gross per 24 hour  Intake 480 ml  Output 935 ml  Net -455 ml   Filed Weights   03/09/19 1854 03/10/19 0500 03/11/19 0300  Weight: 65.4 kg 65.8 kg 69.5 kg    Examination:  General exam: Appears calm and comfortable.  No significant facial or neck swelling noted Respiratory system: Clear to auscultation. Respiratory effort normal. Cardiovascular system: S1 & S2 heard Gastrointestinal system: Abdomen is nondistended, soft and nontender. No organomegaly or masses felt. Normal bowel sounds heard. Central nervous system: Alert and oriented. No focal neurological deficits. Extremities: No leg edema  Data Reviewed: I have personally reviewed following labs and imaging studies  CBC: Recent Labs  Lab 03/09/19 0824 03/09/19 1052 03/10/19 0333 03/11/19 0410  WBC 6.3  --  5.8 6.5  NEUTROABS 4.7  --   --   --   HGB 12.4 11.6* 10.6* 10.9*  HCT 39.4 34.0* 32.6* 34.4*  MCV 102.6*  --  98.2 100.9*  PLT 288  --   188 [id number]   Basic Metabolic Panel: Recent Labs  Lab 03/09/19 0824 03/09/19 1052 03/10/19 0333 03/11/19 0410 03/12/19 0442  NA 136 137 141 139 140  K 5.3* 4.8 4.5 3.7 3.4*  CL 100  --  105 104 101  CO2 26  --  28 24 26   GLUCOSE 146*  --  96 81 72  BUN 20  --  21 23 22   CREATININE 2.03*  --  1.09* 1.14* 1.07*  CALCIUM 9.4  --  8.8* 8.5* 8.8*   GFR: Estimated Creatinine Clearance: 47.7 mL/min (A) (by C-G formula based on SCr of 1.07 mg/dL (H)). Liver Function Tests: Recent Labs  Lab 03/09/19 0824 03/11/19 0410 03/12/19 0442  AST 305* 114* 122*  ALT 184* 163* 203*  ALKPHOS 105 72 73  BILITOT 0.8 0.9 1.1  PROT 5.7* 5.5* 5.6*  ALBUMIN 3.1* 2.7* 2.8*   No results for input(s): LIPASE, AMYLASE in the last 168 hours. No results for input(s): AMMONIA in the last 168 hours. Coagulation Profile: No results for input(s): INR, PROTIME in the last 168 hours. Cardiac Enzymes: No results for input(s): CKTOTAL, CKMB, CKMBINDEX, TROPONINI in the last 168 hours. BNP (last 3 results) No results for input(s): PROBNP in the last 8760 hours. HbA1C: No results for input(s): HGBA1C in the  last 72 hours. CBG: No results for input(s): GLUCAP in the last 168 hours. Lipid Profile: No results for input(s): CHOL, HDL, LDLCALC, TRIG, CHOLHDL, LDLDIRECT in the last 72 hours. Thyroid Function Tests: Recent Labs    03/11/19 1218  TSH 0.495   Anemia Panel: Recent Labs    03/10/19 1250  FERRITIN 32  TIBC 300  IRON 50   Urine analysis:    Component Value Date/Time   COLORURINE YELLOW 03/10/2019 0919   APPEARANCEUR CLEAR 03/10/2019 0919   LABSPEC 1.020 03/10/2019 0919   PHURINE 5.0 03/10/2019 0919   GLUCOSEU NEGATIVE 03/10/2019 0919   HGBUR NEGATIVE 03/10/2019 0919   BILIRUBINUR NEGATIVE 03/10/2019 0919   KETONESUR NEGATIVE 03/10/2019 0919   PROTEINUR NEGATIVE 03/10/2019 0919   UROBILINOGEN 1 04/02/2014 1528   NITRITE NEGATIVE 03/10/2019 0919   LEUKOCYTESUR NEGATIVE 03/10/2019 0919     Sepsis Labs: @LABRCNTIP (procalcitonin:4,lacticidven:4)  ) Recent Results (from the past 240 hour(s))  SARS CORONAVIRUS 2 (TAT 6-24 HRS) Nasopharyngeal Nasopharyngeal Swab     Status: None   Collection Time: 03/09/19  9:03 AM   Specimen: Nasopharyngeal Swab  Result Value Ref Range Status   SARS Coronavirus 2 NEGATIVE NEGATIVE Final    Comment: (NOTE) SARS-CoV-2 target nucleic acids are NOT DETECTED. The SARS-CoV-2 RNA is generally detectable in upper and lower respiratory specimens during the acute phase of infection. Negative results do not preclude SARS-CoV-2 infection, do not rule out co-infections with other pathogens, and should not be used as the sole basis for treatment or other patient management decisions. Negative results must be combined with clinical observations, patient history, and epidemiological information. The expected result is Negative. Fact Sheet for Patients: SugarRoll.be Fact Sheet for Healthcare Providers: https://www.woods-mathews.com/ This test is not yet approved or cleared by the [place] FDA and  has been authorized for detection and/or diagnosis of SARS-CoV-2 by FDA under an Emergency Use Authorization (EUA). This EUA will remain  in effect (meaning this test can be used) for the duration of the COVID-19 declaration under Section 56 4(b)(1) of the Act, 21 U.S.C. section 360bbb-3(b)(1), unless the authorization is terminated or revoked sooner. Performed at [facility], [place] [address], [place], [place] [address]   Culture, blood (routine x 2)     Status: None (Preliminary result)   Collection Time: 03/09/19  9:12 AM   Specimen: BLOOD LEFT ARM  Result Value Ref Range Status   Specimen Description BLOOD LEFT ARM  Final   Special Requests   Final    BOTTLES DRAWN AEROBIC AND ANAEROBIC Blood Culture adequate volume   Culture   Final    NO GROWTH 2 DAYS Performed at [facility], [place] [address], [place], [place] [address]    Report Status PENDING  Incomplete  Culture, blood (routine x 2)     Status: None (Preliminary result)   Collection Time: 03/09/19  9:22 AM   Specimen: BLOOD RIGHT ARM  Result Value Ref Range Status   Specimen Description BLOOD RIGHT ARM  Final   Special Requests   Final    BOTTLES DRAWN AEROBIC AND ANAEROBIC Blood Culture adequate volume   Culture   Final    NO GROWTH 2 DAYS Performed at [facility], [place] [address] [place], [place] [address]    Report Status PENDING  Incomplete  Urine culture     Status: None   Collection Time: 03/10/19  9:19 AM   Specimen: Urine, Clean Catch  Result Value Ref  Range Status   Specimen Description URINE, CLEAN CATCH  Final   Special Requests NONE  Final   Culture   Final    NO GROWTH Performed at [facility], [place] [address] [place], [place] [address]    Report Status 03/11/2019 FINAL  Final         Radiology Studies: No results found.      Scheduled Meds:  aspirin EC  81 mg Oral Daily   atenolol  100 mg Oral Daily   buPROPion  450 mg Oral Daily   enoxaparin (LOVENOX) injection  40 mg Subcutaneous Q24H   famotidine  20 mg Oral QHS   guaiFENesin  1,200 mg Oral BID   hyoscyamine  0.375 mg Oral BID   levothyroxine  100 mcg Intravenous Daily   mirabegron ER  25 mg Oral Daily   sertraline  100 mg Oral Daily   Continuous Infusions:   LOS: 3 days    Time spent: 35 Minutes.    Dana Allan, MD  [facility] Pager #: [phone] 7PM-7AM contact night coverage as above

## 2019-03-12 NOTE — Anesthesia Postprocedure Evaluation (Signed)
Anesthesia Post Note  Patient: [name]  Procedure(s) Performed: ESOPHAGOGASTRODUODENOSCOPY (EGD) WITH PROPOFOL (N/A )     Patient location during evaluation: PACU Anesthesia Type: MAC Level of consciousness: awake and alert Pain management: pain level controlled Vital Signs Assessment: post-procedure vital signs reviewed and stable Respiratory status: spontaneous breathing, nonlabored ventilation, respiratory function stable and patient connected to nasal cannula oxygen Cardiovascular status: stable and blood pressure returned to baseline Postop Assessment: no apparent nausea or vomiting Anesthetic complications: no    Last Vitals:  Vitals:   03/12/19 1023 03/12/19 1035  BP: (!) 107/50 127/85  Pulse: 74 66  Resp: 14 15  Temp: 37.2 C   SpO2: 97% 99%    Last Pain:  Vitals:   03/12/19 1200  TempSrc:   PainSc: 6                  Ryan P Ellender

## 2019-03-12 NOTE — Plan of Care (Signed)
  Problem: Clinical Measurements: Goal: Ability to maintain clinical measurements within normal limits will improve Outcome: Progressing Goal: Diagnostic test results will improve Outcome: Progressing   Problem: Activity: Goal: Risk for activity intolerance will decrease Outcome: Progressing   Problem: Safety: Goal: Ability to remain free from injury will improve Outcome: Progressing

## 2019-03-12 NOTE — Progress Notes (Signed)
  Speech Language Pathology Treatment: Dysphagia  Patient Details Name: [name] MRN: [id number] DOB: 11-22-1950 Today's Date: 03/12/2019 Time: [id number] SLP Time Calculation (min) (ACUTE ONLY): 15 min  Assessment / Plan / Recommendation Clinical Impression  Patient re-assessed at bedside following EGD which showed primarily inflammation around CP. Patient alert and cooperative, reports improved swallowing function since evaluated 11/13, c/o mild-moderate odynophagia however no  "choking" episodes. Skilled observation of patient with consumption of thin clear liquids and pureed solids (to assess for method of pill consumption) revealed audible but functional pharyngeal swallow. Intermittent throat clearing to suggests decreased airway protection, possible deep penetration, however throat clear/cough strong and appearing efficient to clear. Recommend allowing clear liquids as instructed by MD, meds crushed in puree. SLP will f/u for tolerance and potential to advance when cleared by MD.    HPI HPI: 68 y.o. female with medical history significant for CKD stage III,  COPD with emphysema, reported sleep apnea untreated, who had silicone implant removed from bilateral chest 11/11 presented to the emergency room 11/12 with acute onset of neck swelling, cough and fever from home.  CT neck without any evidence of retropharyngeal abscess, subcutaneous emphysema or collections. Dx acute bronchitis/pna with hypoxia, AKI. Pt has c/o of difficulty swallowing, choking.       SLP Plan  Continue with current plan of care       Recommendations  Diet recommendations: Thin liquid Liquids provided via: Cup;Straw Medication Administration: Crushed with puree Supervision: Patient able to self feed;Intermittent supervision to cue for compensatory strategies Compensations: Slow rate;Small sips/bites Postural Changes and/or Swallow Maneuvers: Seated upright 90 degrees;Upright 30-60 min after meal               Oral Care Recommendations: Oral care BID Follow up Recommendations: None SLP Visit Diagnosis: Dysphagia, oropharyngeal phase (R13.12) Plan: Continue with current plan of care       GO          Lauren Tates MA, CCC-SLP        [name] 03/12/2019, 1:52 PM

## 2019-03-12 NOTE — Transfer of Care (Signed)
Immediate Anesthesia Transfer of Care Note  Patient: [name]  Procedure(s) Performed: ESOPHAGOGASTRODUODENOSCOPY (EGD) WITH PROPOFOL (N/A )  Patient Location: PACU  Anesthesia Type:MAC  Level of Consciousness: awake and alert   Airway & Oxygen Therapy: Patient Spontanous Breathing  Post-op Assessment: Report given to RN and Post -op Vital signs reviewed and stable  Post vital signs: Reviewed and stable  Last Vitals:  Vitals Value Taken Time  BP 107/50 03/12/19 1025  Temp 37.2 C 03/12/19 1023  Pulse 66 03/12/19 1029  Resp 17 03/12/19 1029  SpO2 98 % 03/12/19 1029  Vitals shown include unvalidated device data.  Last Pain:  Vitals:   03/12/19 1023  TempSrc: Temporal  PainSc:       Patients Stated Pain Goal: 3 (64/40/34 7425)  Complications: No apparent anesthesia complications

## 2019-03-12 NOTE — Interval H&P Note (Signed)
History and Physical Interval Note:  03/12/2019 10:00 AM  [name]  has presented today for surgery, with the diagnosis of Dysphagia and odynophagia.  The various methods of treatment have been discussed with the patient and family. After consideration of risks, benefits and other options for treatment, the patient has consented to  Procedure(s): ESOPHAGOGASTRODUODENOSCOPY (EGD) WITH PROPOFOL (N/A) as a surgical intervention.  The patient's history has been reviewed, patient examined, no change in status, stable for surgery.  I have reviewed the patient's chart and labs.  Questions were answered to the patient's satisfaction.     Wilhelmenia Addis D

## 2019-03-12 NOTE — Progress Notes (Signed)
SLP Cancellation Note  Patient Details Name: [name] MRN: [id number] DOB: Jul 19, 1950   Cancelled treatment:       Reason Eval/Treat Not Completed: Patient at procedure or test/unavailable   Gabriel Rainwater MA, CCC-SLP     [name] 03/12/2019, 8:58 AM

## 2019-03-13 ENCOUNTER — Encounter (HOSPITAL_COMMUNITY): Payer: Self-pay | Admitting: Gastroenterology

## 2019-03-13 LAB — COMPREHENSIVE METABOLIC PANEL
ALT: 169 U/L — ABNORMAL HIGH (ref 0–44)
AST: 76 U/L — ABNORMAL HIGH (ref 15–41)
Albumin: 2.6 g/dL — ABNORMAL LOW (ref 3.5–5.0)
Alkaline Phosphatase: 67 U/L (ref 38–126)
Anion gap: 11 (ref 5–15)
BUN: 13 mg/dL (ref 8–23)
CO2: 27 mmol/L (ref 22–32)
Calcium: 8.2 mg/dL — ABNORMAL LOW (ref 8.9–10.3)
Chloride: 98 mmol/L (ref 98–111)
Creatinine, Ser: 0.92 mg/dL (ref 0.44–1.00)
GFR calc Af Amer: >60 mL/min (ref >60)
GFR calc non Af Amer: >60 mL/min (ref >60)
Glucose, Bld: 98 mg/dL (ref 70–99)
Potassium: 3.2 mmol/L — ABNORMAL LOW (ref 3.5–5.1)
Sodium: 136 mmol/L (ref 135–145)
Total Bilirubin: 0.8 mg/dL (ref 0.3–1.2)
Total Protein: 5.3 g/dL — ABNORMAL LOW (ref 6.5–8.1)

## 2019-03-13 LAB — MAGNESIUM: Magnesium: 1.7 mg/dL (ref 1.7–2.4)

## 2019-03-13 MED ORDER — PANTOPRAZOLE SODIUM 40 MG PO TBEC
40.0000 mg | DELAYED_RELEASE_TABLET | Freq: Every day | ORAL | Status: DC
  Administered 2019-03-13 – 2019-03-14 (×2): 40 mg via ORAL
  Filled 2019-03-13 (×2): qty 1

## 2019-03-13 MED ORDER — POTASSIUM CHLORIDE 20 MEQ PO PACK
40.0000 meq | PACK | ORAL | Status: AC
  Filled 2019-03-13 (×2): qty 2

## 2019-03-13 MED ORDER — FAMOTIDINE 20 MG PO TABS
20.0000 mg | ORAL_TABLET | Freq: Two times a day (BID) | ORAL | Status: DC
  Administered 2019-03-13 – 2019-03-14 (×3): 20 mg via ORAL
  Filled 2019-03-13 (×3): qty 1

## 2019-03-13 NOTE — Consult Note (Signed)
Reason for Consult: Odynophagia Referring Physician: Bonnell Public, MD  [name] is an 68 y.o. female.  HPI: Retired Marine scientist, history of reflux, had cosmetic surgery on Thursday of last week.  She spent the night at the outpatient center when home the next day.  Postop day 1 she drank 3 cans of Coca-Cola which is unusual for her.  On postop day 2 she developed severe nausea and vomiting and had intractable vomiting.  That resulted in a trip to the emergency department where she was admitted and found to have pneumonia.  She underwent EGD and no abnormal findings were identified in the esophagus or stomach.  Over the following 24-48 hours she has had some improvement in her swallowing.  Is still uncomfortable but she is able to drink and wants to start to eat something today.  She normally takes Pepcid in the evening for her reflux.  She normally only drinks 1 cup of coffee daily.  Past Medical History:  Diagnosis Date  . Chronic kidney disease (CKD), stage III (moderate)   . Diverticulosis   . Emphysema lung ([facility])   . Helicobacter pylori (H. pylori)   . IBS (irritable bowel syndrome)   . LBBB (left bundle branch block)   . Lower urinary tract infection 06/21/2017  . Migraines   . OCD (obsessive compulsive disorder)   . Prediabetes    pcp  . Renal disorder    AKI due to lisinpril, his. pyelonephritis  . Sepsis ([facility])   . Sleep apnea    no CPAP  . Vitamin D deficiency     Past Surgical History:  Procedure Laterality Date  . AUGMENTATION MAMMAPLASTY    . BREAST ENHANCEMENT SURGERY    . BREAST IMPLANT REMOVAL Bilateral 03/07/2019  . BUNIONECTOMY     Bil  . COLON SURGERY     colonoscopy  . ESOPHAGOGASTRODUODENOSCOPY (EGD) WITH PROPOFOL N/A 03/12/2019   Procedure: ESOPHAGOGASTRODUODENOSCOPY (EGD) WITH PROPOFOL;  Surgeon: Carol Ada, MD;  Location: [facility];  Service: Endoscopy;  Laterality: N/A;  . HEMORRHOID SURGERY    . OOPHORECTOMY    . ORIF ANKLE FRACTURE Left  08/30/2017   Procedure: OPEN REDUCTION INTERNAL FIXATION (ORIF) ANKLE FRACTURE;  Surgeon: Rod Can, MD;  Location: [facility];  Service: Orthopedics;  Laterality: Left;  . TONSILLECTOMY      Family History  Problem Relation Age of Onset  . Colon polyps Mother   . Colon cancer Neg Hx     Social History:  reports that she quit smoking about 16 years ago. Her smoking use included cigarettes. She has a 40.00 pack-year smoking history. She has never used smokeless tobacco. She reports that she does not drink alcohol or use drugs.  Allergies:  Allergies  Allergen Reactions  . Ace Inhibitors Other (See Comments)    Acute renal failure (lisinopril)  . Cephalexin Anaphylaxis  . Dilaudid [Hydromorphone Hcl]   . Macrobid [Nitrofurantoin] Other (See Comments)    Causes hyperthermia   . Robaxin [Methocarbamol]   . Codeine Nausea Only  . Latex Rash  . Sulfa Antibiotics Other (See Comments)    Do not take per nephrology    Medications: Reviewed  Results for orders placed or performed during the hospital encounter of 03/09/19 (from the past 48 hour(s))  TSH     Status: None   Collection Time: 03/11/19 12:18 PM  Result Value Ref Range   TSH 0.495 0.350 - 4.500 uIU/mL    Comment: Performed by a 3rd Generation assay  with a functional sensitivity of <=0.01 uIU/mL. Performed at [facility], [place] [address], [place], [place] [address]   Hepatitis panel, acute     Status: None   Collection Time: 03/11/19  3:09 PM  Result Value Ref Range   Hepatitis B Surface Ag NON REACTIVE NON REACTIVE   HCV Ab NON REACTIVE NON REACTIVE    Comment: (NOTE) Nonreactive HCV antibody screen is consistent with no HCV infections,  unless recent infection is suspected or other evidence exists to indicate HCV infection.    Hep A IgM NON REACTIVE NON REACTIVE   Hep B C IgM NON REACTIVE NON REACTIVE    Comment: Performed at [facility], [place] [address], [place], [place] [address]  Procalcitonin      Status: None   Collection Time: 03/12/19  4:42 AM  Result Value Ref Range   Procalcitonin <0.10 ng/mL    Comment:        Interpretation: PCT (Procalcitonin) <= 0.5 ng/mL: Systemic infection (sepsis) is not likely. Local bacterial infection is possible. (NOTE)       Sepsis PCT Algorithm           Lower Respiratory Tract                                      Infection PCT Algorithm    ----------------------------     ----------------------------         PCT < 0.25 ng/mL                PCT < 0.10 ng/mL         Strongly encourage             Strongly discourage   discontinuation of antibiotics    initiation of antibiotics    ----------------------------     -----------------------------       PCT 0.25 - 0.50 ng/mL            PCT 0.10 - 0.25 ng/mL               OR       >80% decrease in PCT            Discourage initiation of                                            antibiotics      Encourage discontinuation           of antibiotics    ----------------------------     -----------------------------         PCT >= 0.50 ng/mL              PCT 0.26 - 0.50 ng/mL               AND        <80% decrease in PCT             Encourage initiation of                                             antibiotics       Encourage continuation  of antibiotics    ----------------------------     -----------------------------        PCT >= 0.50 ng/mL                  PCT > 0.50 ng/mL               AND         increase in PCT                  Strongly encourage                                      initiation of antibiotics    Strongly encourage escalation           of antibiotics                                     -----------------------------                                           PCT <= 0.25 ng/mL                                                 OR                                        > 80% decrease in PCT                                     Discontinue / Do not initiate                                              antibiotics Performed at [facility], [address] [place], [place] [address]   Comprehensive metabolic panel     Status: Abnormal   Collection Time: 03/12/19  4:42 AM  Result Value Ref Range   Sodium 140 135 - 145 mmol/L   Potassium 3.4 (L) 3.5 - 5.1 mmol/L   Chloride 101 98 - 111 mmol/L   CO2 26 22 - 32 mmol/L   Glucose, Bld 72 70 - 99 mg/dL   BUN 22 8 - 23 mg/dL   Creatinine, Ser 1.07 (H) 0.44 - 1.00 mg/dL   Calcium 8.8 (L) 8.9 - 10.3 mg/dL   Total Protein 5.6 (L) 6.5 - 8.1 g/dL   Albumin 2.8 (L) 3.5 - 5.0 g/dL   AST 122 (H) 15 - 41 U/L   ALT 203 (H) 0 - 44 U/L   Alkaline Phosphatase 73 38 - 126 U/L   Total Bilirubin 1.1 0.3 - 1.2 mg/dL   GFR calc non Af Amer 54 (L) >60 mL/min   GFR calc Af Amer >60 >60 mL/min   Anion gap 13 5 - 15  Comment: Performed at [facility], [place] [address] [place], [place] [address]  Comprehensive metabolic panel     Status: Abnormal   Collection Time: 03/13/19  3:43 AM  Result Value Ref Range   Sodium 136 135 - 145 mmol/L   Potassium 3.2 (L) 3.5 - 5.1 mmol/L   Chloride 98 98 - 111 mmol/L   CO2 27 22 - 32 mmol/L   Glucose, Bld 98 70 - 99 mg/dL   BUN 13 8 - 23 mg/dL   Creatinine, Ser 0.92 0.44 - 1.00 mg/dL   Calcium 8.2 (L) 8.9 - 10.3 mg/dL   Total Protein 5.3 (L) 6.5 - 8.1 g/dL   Albumin 2.6 (L) 3.5 - 5.0 g/dL   AST 76 (H) 15 - 41 U/L   ALT 169 (H) 0 - 44 U/L   Alkaline Phosphatase 67 38 - 126 U/L   Total Bilirubin 0.8 0.3 - 1.2 mg/dL   GFR calc non Af Amer >60 >60 mL/min   GFR calc Af Amer >60 >60 mL/min   Anion gap 11 5 - 15    Comment: Performed at [facility], [place] [address] [place], [place] [address]    No results found.  [id number] except as listed in admit H&P  Blood pressure 138/76, pulse (!) 57, temperature 98.9 F (37.2 C), temperature source Oral, resp. rate 19, height 5\' 3"  (1.6 m), weight 78.6 kg, SpO2 98 %.  PHYSICAL EXAM: Overall appearance:  Healthy appearing,  in no distress, breathing and voice are clear. Head:  Normocephalic, atraumatic. Ears: External ears look healthy. Nose: External nose is healthy in appearance. Internal nasal exam free of any lesions or obstruction. Oral Cavity/Pharynx:  There are no mucosal lesions or masses identified. Larynx/Hypopharynx: See below Neuro:  No identifiable neurologic deficits. Neck: No palpable neck masses.  Studies Reviewed: CT scan reviewed.  Procedures: Flexible fiberoptic laryngoscopy was performed.  Topical Afrin/Xylocaine spray was applied to the nasal cavities.  The scope was passed down the right side of the nose through the nasopharynx and beyond the oropharynx to the hypopharynx and to visualize the larynx.  Hypopharynx is clear except for severe swelling of the arytenoids.  Esophageal introitus is also edematous.  There is no mucosal mass or ulceration identified.  The cords move well.  There are no intrinsic mucosal lesions of the larynx or hypopharynx.  She tolerated this well.   Assessment/Plan: I suspect she had a combination of hypopharyngeal burn from intractable vomiting and from worsening reflux from the 3 cans of Coke she had the day before.  There is no signs of infection or neoplasm.  I think with time this will continue to improve.  Recommend double up on the H2 blocker for now and avoid caffeine.  Follow-up as needed in the office.  Izora Gala 03/13/2019, 12:09 PM

## 2019-03-13 NOTE — Progress Notes (Signed)
PROGRESS NOTE    KINLEI CARDILE  [id number] DOB: March 09, 1951 DOA: 03/09/2019 PCP: Unk [name], MD  Outpatient Specialists:     Brief Narrative:  Patient is a 68 year old Caucasian female with past medical history significant for COPD with emphysema, chronic left bundle branch block and CKD stage III with baseline serum creatinine of 1.09.  Apparently, patient underwent removal of breast implants on 03/08/19.  The procedure was done under general anesthesia, and patient was intubated.  Subsequently, patient developed worsening facial and neck swelling, vomiting, difficulty/pain on swallowing, and cough productive of white sputum.  On presentation to the emergency room on 03/09/2019, patient was found to have fever of 102 degrees Fahrenheit, oxygen desaturation of 79% on room air requiring 2 L nasal cannula otherwise hemodynamically stable.  High-sensitivity troponin of 622, CO2 on ABG of 56, lactic acid of 2.2, potassium 5.3, creatinine 2.03, BNP 418.  CT chest showed evidence of chronic emphysema with mucous plugging but no evidence of pneumonia or pulmonary edema.  CT neck showed no acute abnormalities.  Patient was admitted for further assessment and management.  Patient has been on broad-spectrum antibiotics.  Cardiology team was consulted and cardiac findings were thought to be demand ischemia.  Patient will follow with cardiology on discharge.   During the hospital stay, facial and neck swelling have continued to improve.  However, patient continued to report significant pain on swallowing.  Patient underwent EGD on 03/12/2019 that revealed moderate inflammation characterized by congestion and shallow ulceration involving the cricopharyngeal area.  ENT, Dr. Constance Holster has been consulted.  ENT consulted and will see patient in the morning.  Meanwhile, will start patient on viscous lidocaine 15 mils every 6 hourly for now.  We will also check herpes 1 and 2 PCR.  Further management depend on  hospital course.  03/13/2019: Patient seen.  Patient did not tolerate the viscous lidocaine, therefore, did not take it.  Odynophagia is improving.  ENT input is highly appreciated.  ENT is of the opinion that the cricopharyngeal inflammation is likely secondary to burn from intractable vomiting and worsening reflux from 3 cans of Coke (please see ENT surgeons note).  Doubling of the H2 blocker advised by the ENT surgeon, and to avoid caffeine.  Assessment & Plan:   Principal Problem:   Pneumonia due to infectious agent Active Problems:   Hypothyroidism   ADHD (attention deficit hyperactivity disorder)   HTN (hypertension)   CKD Stage 3 (HCC)   Post-operative state   AKI (acute kidney injury) ([facility])   Odynophagia   Dysphagia   Transaminitis   CHF NYHA class II, acute, diastolic (HCC)  Dysphagia and odynophagia: -Likely secondary to cricopharyngeal inflammation.   -Patient underwent intubation about 3 days prior to presentation.   -Nausea, vomiting and the use of Coca-Cola (Coke) reported -Patient did not tolerate viscous lidocaine.   -Herpes 1 and 2 PCR. -ENT, Dr. Constance Holster consulted, please see documentation above. -Increase Pepcid from 20 mg p.o. once daily to twice daily. -Start Protonix 40 Mg p.o. once daily -Odynophagia is gradually improving. -Further management will depend on hospital course   Cricopharyngeal inflammation:  -Please see above.   -H2 blocker as per ENT -Start PPI -Follow-up with ENT on discharge.    Acute hypoxic respiratory failure: -Resolved. -CT chest done on presentation revealed bilateral areas of segmental bronchus mucous plugging, as well as, mild chronic centrilobular and paraseptal emphysema. -CT soft tissue of the neck did not reveal any significant findings, but was suboptimal study due  to artifact.   -Doubt pneumonic process.   -Facial and neck swelling was also reported on presentation, but improved significantly   Elevated troponin:  -Cardiology input appreciated -Demand ischemia entertained. -No cardiac symptoms present. -Follow with cardiology on discharge for further work-up.  Acute kidney injury on chronic kidney disease stage III:  -AKI has resolved.   -Possibly prerenal.   -Patient presented with odynophagia and dysphagia, therefore, may have had poor p.o. intake.    Status post removal of breast implant on 03/08/2019.   Transaminitis: -Continue to monitor closely. -AST is down to 76 today.   -ALT is down to 169 today.    Hypokalemia: Potassium is 3.2 today. Check magnesium level Replace potassium  Grade 1 diastolic CHF: -Stable. -No current CHF symptoms. -IV fluid has been discontinued.  SIRS: -Resolved.   Lactic acidosis: -Resolved.  Peaked at 2.2.  Chronic Emphysema: -No exacerbation noted today.  -Recent mucous plugging noted.  -Continue current management.    Normocytic anemia: -Continue to monitor.  Hypertension: -Controlled.  DVT prophylaxis: Subcutaneous Lovenox Code Status: Full code Family Communication:  Disposition Plan: Home eventually  Consultants:   GI  ENT  Procedures:   EGD revealed cricopharyngeal inflammation  Antimicrobials:   None   Subjective: Odynophagia is improving No fever or chills  Objective: Vitals:   03/12/19 1617 03/12/19 2320 03/13/19 0300 03/13/19 0754  BP: (!) 143/80 140/87  138/76  Pulse: (!) 57 (!) 56  (!) 57  Resp:  18  19  Temp: 98.1 F (36.7 C) 98.4 F (36.9 C)  98.9 F (37.2 C)  TempSrc: Oral Oral  Oral  SpO2: 96% 97%  98%  Weight:   78.6 kg   Height:        Intake/Output Summary (Last 24 hours) at 03/13/2019 0810 Last data filed at 03/13/2019 0600 Gross per 24 hour  Intake 20 ml  Output 30 ml  Net -10 ml   Filed Weights   03/10/19 0500 03/11/19 0300 03/13/19 0300  Weight: 65.8 kg 69.5 kg 78.6 kg    Examination:  General exam: Appears calm and comfortable.  No significant facial or neck swelling  noted Respiratory system: Clear to auscultation. Respiratory effort normal. Cardiovascular system: S1 & S2 heard Gastrointestinal system: Abdomen is nondistended, soft and nontender. No organomegaly or masses felt. Normal bowel sounds heard. Central nervous system: Alert and oriented. No focal neurological deficits. Extremities: No leg edema  Data Reviewed: I have personally reviewed following labs and imaging studies  CBC: Recent Labs  Lab 03/09/19 0824 03/09/19 1052 03/10/19 0333 03/11/19 0410  WBC 6.3  --  5.8 6.5  NEUTROABS 4.7  --   --   --   HGB 12.4 11.6* 10.6* 10.9*  HCT 39.4 34.0* 32.6* 34.4*  MCV 102.6*  --  98.2 100.9*  PLT 288  --  188 [id number]   Basic Metabolic Panel: Recent Labs  Lab 03/09/19 0824 03/09/19 1052 03/10/19 0333 03/11/19 0410 03/12/19 0442 03/13/19 0343  NA 136 137 141 139 140 136  K 5.3* 4.8 4.5 3.7 3.4* 3.2*  CL 100  --  105 104 101 98  CO2 26  --  28 24 26 27   GLUCOSE 146*  --  96 81 72 98  BUN 20  --  21 23 22 13   CREATININE 2.03*  --  1.09* 1.14* 1.07* 0.92  CALCIUM 9.4  --  8.8* 8.5* 8.8* 8.2*   GFR: Estimated Creatinine Clearance: 58.9 mL/min (by C-G formula based on SCr  of 0.92 mg/dL). Liver Function Tests: Recent Labs  Lab 03/09/19 0824 03/11/19 0410 03/12/19 0442 03/13/19 0343  AST 305* 114* 122* 76*  ALT 184* 163* 203* 169*  ALKPHOS 105 72 73 67  BILITOT 0.8 0.9 1.1 0.8  PROT 5.7* 5.5* 5.6* 5.3*  ALBUMIN 3.1* 2.7* 2.8* 2.6*   No results for input(s): LIPASE, AMYLASE in the last 168 hours. No results for input(s): AMMONIA in the last 168 hours. Coagulation Profile: No results for input(s): INR, PROTIME in the last 168 hours. Cardiac Enzymes: No results for input(s): CKTOTAL, CKMB, CKMBINDEX, TROPONINI in the last 168 hours. BNP (last 3 results) No results for input(s): PROBNP in the last 8760 hours. HbA1C: No results for input(s): HGBA1C in the last 72 hours. CBG: No results for input(s): GLUCAP in the last 168 hours.  Lipid Profile: No results for input(s): CHOL, HDL, LDLCALC, TRIG, CHOLHDL, LDLDIRECT in the last 72 hours. Thyroid Function Tests: Recent Labs    03/11/19 1218  TSH 0.495   Anemia Panel: Recent Labs    03/10/19 1250  FERRITIN 32  TIBC 300  IRON 50   Urine analysis:    Component Value Date/Time   COLORURINE YELLOW 03/10/2019 0919   APPEARANCEUR CLEAR 03/10/2019 0919   LABSPEC 1.020 03/10/2019 0919   PHURINE 5.0 03/10/2019 0919   GLUCOSEU NEGATIVE 03/10/2019 0919   HGBUR NEGATIVE 03/10/2019 0919   BILIRUBINUR NEGATIVE 03/10/2019 0919   KETONESUR NEGATIVE 03/10/2019 0919   PROTEINUR NEGATIVE 03/10/2019 0919   UROBILINOGEN 1 04/02/2014 1528   NITRITE NEGATIVE 03/10/2019 0919   LEUKOCYTESUR NEGATIVE 03/10/2019 0919   Sepsis Labs: @LABRCNTIP (procalcitonin:4,lacticidven:4)  ) Recent Results (from the past 240 hour(s))  SARS CORONAVIRUS 2 (TAT 6-24 HRS) Nasopharyngeal Nasopharyngeal Swab     Status: None   Collection Time: 03/09/19  9:03 AM   Specimen: Nasopharyngeal Swab  Result Value Ref Range Status   SARS Coronavirus 2 NEGATIVE NEGATIVE Final    Comment: (NOTE) SARS-CoV-2 target nucleic acids are NOT DETECTED. The SARS-CoV-2 RNA is generally detectable in upper and lower respiratory specimens during the acute phase of infection. Negative results do not preclude SARS-CoV-2 infection, do not rule out co-infections with other pathogens, and should not be used as the sole basis for treatment or other patient management decisions. Negative results must be combined with clinical observations, patient history, and epidemiological information. The expected result is Negative. Fact Sheet for Patients: SugarRoll.be Fact Sheet for Healthcare Providers: https://www.woods-mathews.com/ This test is not yet approved or cleared by the [place] FDA and  has been authorized for detection and/or diagnosis of SARS-CoV-2 by FDA under an  Emergency Use Authorization (EUA). This EUA will remain  in effect (meaning this test can be used) for the duration of the COVID-19 declaration under Section 56 4(b)(1) of the Act, 21 U.S.C. section 360bbb-3(b)(1), unless the authorization is terminated or revoked sooner. Performed at [facility], [place] [address], [place], [place] [address]   Culture, blood (routine x 2)     Status: None (Preliminary result)   Collection Time: 03/09/19  9:12 AM   Specimen: BLOOD LEFT ARM  Result Value Ref Range Status   Specimen Description BLOOD LEFT ARM  Final   Special Requests   Final    BOTTLES DRAWN AEROBIC AND ANAEROBIC Blood Culture adequate volume   Culture   Final    NO GROWTH 3 DAYS Performed at [facility], [address], [place], [place] [address]    Report Status  PENDING  Incomplete  Culture, blood (routine x 2)     Status: None (Preliminary result)   Collection Time: 03/09/19  9:22 AM   Specimen: BLOOD RIGHT ARM  Result Value Ref Range Status   Specimen Description BLOOD RIGHT ARM  Final   Special Requests   Final    BOTTLES DRAWN AEROBIC AND ANAEROBIC Blood Culture adequate volume   Culture   Final    NO GROWTH 3 DAYS Performed at [facility], [address] [place], [place] [address]    Report Status PENDING  Incomplete  Urine culture     Status: None   Collection Time: 03/10/19  9:19 AM   Specimen: Urine, Clean Catch  Result Value Ref Range Status   Specimen Description URINE, CLEAN CATCH  Final   Special Requests NONE  Final   Culture   Final    NO GROWTH Performed at [facility], [place] [address] [place], [place] [address]    Report Status 03/11/2019 FINAL  Final         Radiology Studies: No results found.      Scheduled Meds: . aspirin EC  81 mg Oral Daily  . atenolol  100 mg Oral Daily  . buPROPion  450 mg Oral Daily  . enoxaparin (LOVENOX) injection  40 mg Subcutaneous Q24H  . famotidine  20 mg Oral QHS  . guaiFENesin   1,200 mg Oral BID  . hyoscyamine  0.25 mg Sublingual Q8H  . levothyroxine  100 mcg Intravenous Daily  . lidocaine  15 mL Mouth/Throat Q6H  . mirabegron ER  25 mg Oral Daily  . sertraline  100 mg Oral Daily   Continuous Infusions:   LOS: 4 days    Time spent: 35 Minutes.    Dana Allan, MD  [facility] Pager #: [phone] 7PM-7AM contact night coverage as above

## 2019-03-13 NOTE — Plan of Care (Signed)
  Problem: Education: Goal: Knowledge of General Education information will improve Description: Including pain rating scale, medication(s)/side effects and non-pharmacologic comfort measures Outcome: Progressing   Problem: Clinical Measurements: Goal: Will remain free from infection Outcome: Progressing Goal: Respiratory complications will improve Outcome: Progressing   Problem: Nutrition: Goal: Adequate nutrition will be maintained Outcome: Progressing   Problem: Coping: Goal: Level of anxiety will decrease Outcome: Progressing   Problem: Pain Managment: Goal: General experience of comfort will improve Outcome: Progressing   Problem: Safety: Goal: Ability to remain free from injury will improve Outcome: Progressing

## 2019-03-13 NOTE — Care Management Important Message (Signed)
Important Message  Patient Details  Name: [name] MRN: [id number] Date of Birth: 08-20-1950   Medicare Important Message Given:  Yes     [name] 03/13/2019, 1:00 PM

## 2019-03-13 NOTE — Progress Notes (Signed)
SLP Cancellation Note  Patient Details Name: [name] MRN: [id number] DOB: December 15, 1950   Cancelled treatment:       Reason Eval/Treat Not Completed: Patient at procedure or test/unavailable.  Pt is currently being seen by ENT.  SLP will f/u as schedule allows.    Colin Mulders., M.S., [facility] Office: [phone]   [place] 03/13/2019, 12:00 PM

## 2019-03-14 LAB — CULTURE, BLOOD (ROUTINE X 2)
Culture: NO GROWTH
Culture: NO GROWTH
Special Requests: ADEQUATE
Special Requests: ADEQUATE

## 2019-03-14 LAB — CBC WITH DIFFERENTIAL/PLATELET
Abs Immature Granulocytes: 0.05 10*3/uL (ref 0.00–0.07)
Basophils Absolute: 0.1 10*3/uL (ref 0.0–0.1)
Basophils Relative: 1 %
Eosinophils Absolute: 0.5 10*3/uL (ref 0.0–0.5)
Eosinophils Relative: 8 %
HCT: 40.2 % (ref 36.0–46.0)
Hemoglobin: 13.4 g/dL (ref 12.0–15.0)
Immature Granulocytes: 1 %
Lymphocytes Relative: 31 %
Lymphs Abs: 1.8 10*3/uL (ref 0.7–4.0)
MCH: 31.9 pg (ref 26.0–34.0)
MCHC: 33.3 g/dL (ref 30.0–36.0)
MCV: 95.7 fL (ref 80.0–100.0)
Monocytes Absolute: 0.7 10*3/uL (ref 0.1–1.0)
Monocytes Relative: 12 %
Neutro Abs: 2.8 10*3/uL (ref 1.7–7.7)
Neutrophils Relative %: 47 %
Platelets: 250 10*3/uL (ref 150–400)
RBC: 4.2 MIL/uL (ref 3.87–5.11)
RDW: 13.1 % (ref 11.5–15.5)
WBC: 5.8 10*3/uL (ref 4.0–10.5)
nRBC: 0 % (ref 0.0–0.2)

## 2019-03-14 LAB — RENAL FUNCTION PANEL
Albumin: 2.6 g/dL — ABNORMAL LOW (ref 3.5–5.0)
Anion gap: 13 (ref 5–15)
BUN: 9 mg/dL (ref 8–23)
CO2: 24 mmol/L (ref 22–32)
Calcium: 8.2 mg/dL — ABNORMAL LOW (ref 8.9–10.3)
Chloride: 103 mmol/L (ref 98–111)
Creatinine, Ser: 0.96 mg/dL (ref 0.44–1.00)
GFR calc Af Amer: >60 mL/min (ref >60)
GFR calc non Af Amer: >60 mL/min (ref >60)
Glucose, Bld: 106 mg/dL — ABNORMAL HIGH (ref 70–99)
Phosphorus: 3.1 mg/dL (ref 2.5–4.6)
Potassium: 3.3 mmol/L — ABNORMAL LOW (ref 3.5–5.1)
Sodium: 140 mmol/L (ref 135–145)

## 2019-03-14 LAB — MAGNESIUM: Magnesium: 1.6 mg/dL — ABNORMAL LOW (ref 1.7–2.4)

## 2019-03-14 MED ORDER — POTASSIUM CHLORIDE CRYS ER 20 MEQ PO TBCR
40.0000 meq | EXTENDED_RELEASE_TABLET | Freq: Once | ORAL | Status: AC
  Administered 2019-03-14: 40 meq via ORAL
  Filled 2019-03-14: qty 2

## 2019-03-14 MED ORDER — PANTOPRAZOLE SODIUM 40 MG PO TBEC: 40.0000 mg | DELAYED_RELEASE_TABLET | Freq: Every day | ORAL | 1 refills | Status: DC

## 2019-03-14 MED ORDER — FAMOTIDINE 20 MG PO TABS: 20.0000 mg | ORAL_TABLET | Freq: Two times a day (BID) | ORAL | 1 refills | Status: DC

## 2019-03-14 MED ORDER — MAGNESIUM SULFATE 2 GM/50ML IV SOLN
2.0000 g | Freq: Once | INTRAVENOUS | Status: AC
  Administered 2019-03-14: 2 g via INTRAVENOUS
  Filled 2019-03-14: qty 50

## 2019-03-14 MED FILL — FAMOTIDINE 20 MG TABS: 20 | 30 days supply | Qty: 60 | Fill #0

## 2019-03-14 MED FILL — PANTOPRAZOLE SOD DR 40 MG T: 40 | 30 days supply | Qty: 30 | Fill #0

## 2019-03-14 NOTE — Progress Notes (Signed)
Patient potassium level is 3.2. When I attempted to give it to her at 2223, pt declined it.

## 2019-03-14 NOTE — Discharge Summary (Addendum)
Physician Discharge Summary  [name] [id number] DOB: 1950-05-10 DOA: 03/09/2019  PCP: Unk [name], MD  Admit date: 03/09/2019 Discharge date: 03/29/2019  Admitted From: Home  Disposition: Home   Recommendations for Outpatient Follow-up:  Follow up with PCP in 1-2 weeks Please obtain BMP/CBC in one week   Home Health: No Equipment/Devices: None  Discharge Condition: Stable CODE STATUS: Full  Diet recommendation: Regular  Brief/Interim Summary:  68 year old Caucasian female with past medical history significant for COPD with emphysema, chronic left bundle branch block and CKD stage III with baseline serum creatinine of 1.09.  Apparently, patient underwent removal of breast implants on 03/08/19.  The procedure was done under general anesthesia, and patient was intubated.  Subsequently, patient developed worsening facial and neck swelling, vomiting, difficulty/pain on swallowing, and cough productive of white sputum.  On presentation to the emergency room on 03/09/2019, patient was found to have fever of 102 degrees Fahrenheit, oxygen desaturation of 79% on room air requiring 2 L nasal cannula otherwise hemodynamically stable.  High-sensitivity troponin of 622, CO2 on ABG of 56, lactic acid of 2.2, potassium 5.3, creatinine 2.03, BNP 418.  CT chest showed evidence of chronic emphysema with mucous plugging but no evidence of pneumonia or pulmonary edema.  CT neck showed no acute abnormalities.  Patient was admitted for further assessment and management, treated with broad-spectrum antibiotics.  Cardiology team was consulted and cardiac findings were thought to be demand ischemia.  Patient will follow with cardiology on discharge.    During the hospital stay, facial and neck swelling have continued to improve, but with severe pain on swallowing.  EGD on 03/12/2019 revealed moderate inflammation characterized by congestion and shallow ulceration involving the cricopharyngeal area.   ENT, Dr. Constance Holster consulted and patient underwent laryngoscopy.  ENTs assessment was symptoms due to a combination of hypopharyngeal burn due to intractable vomiting in addition to worsening reflux.  Recommended increasing H2 blocker to twice daily, and avoiding caffeine.  Patient to follow-up as needed with ENT as outpatient.  Patient did not tolerate a trial of viscous lidocaine.   Discharge Diagnoses: Principal Problem:   Odynophagia Active Problems:   Hypothyroidism   ADHD (attention deficit hyperactivity disorder)   HTN (hypertension)   CKD Stage 3 (HCC)   Post-operative state   Dysphagia   Transaminitis   CHF NYHA class II, acute, diastolic (HCC)  Dysphagia and odynophagia: -Likely secondary to cricopharyngeal inflammation.   -Patient underwent intubation about 3 days prior to presentation.   -Nausea, vomiting and the use of Coca-Cola (Coke) reported -Patient did not tolerate viscous lidocaine.   -Herpes 1 and 2 PCR pending -ENT, Dr. Constance Holster consulted, please see documentation above. -Increase Pepcid from 20 mg p.o. once daily to twice daily. -Start Protonix 40 Mg p.o. once daily -Odynophagia is gradually improving.   Cricopharyngeal inflammation:  -Please see above.   -H2 blocker as per ENT -Start PPI -Follow-up with ENT on discharge.     Acute hypoxic respiratory failure: -Resolved.  Suspect related to underlying emphysema/COPD in addition to throat inflammation as above.  Doubt pneumonia. -CT chest done on presentation revealed bilateral areas of segmental bronchus mucous plugging, as well as, mild chronic centrilobular and paraseptal emphysema. -CT soft tissue of the neck did not reveal any significant findings, but was suboptimal study due to artifact.     -Facial and neck swelling was also reported on presentation, but improved significantly    Elevated troponin: -Cardiology input appreciated -Demand ischemia entertained. -No cardiac symptoms present. -  Follow with  cardiology on discharge for further work-up.   Acute kidney injury on chronic kidney disease stage III:  -AKI has resolved.   -Possibly prerenal.   -Patient presented with odynophagia and dysphagia, therefore, may have had poor p.o. intake.     Status post removal of breast implant on 03/08/2019.    Transaminitis: -Continue to monitor closely. -AST is down to 76 today.   -ALT is down to 169 today.     Hypokalemia: Potassium is 3.2 today. Check magnesium level Replace potassium   Grade 1 diastolic CHF: -Stable. -No current CHF symptoms. -IV fluid has been discontinued.   SIRS: -Resolved.    Lactic acidosis: -Resolved.  Peaked at 2.2.   Chronic Emphysema: -No exacerbation noted today.  -Recent mucous plugging noted.  -Continue current management.     Normocytic anemia: -Continue to monitor.   Hypertension: -Controlled.  Discharge Instructions   Discharge Instructions     Call MD for:  severe uncontrolled pain   Complete by: As directed    Call MD for:  temperature >100.4   Complete by: As directed    Diet - low sodium heart healthy   Complete by: As directed    Discharge instructions   Complete by: As directed    Advance diet as tolerated as pain with swallowing improves.   Increase activity slowly   Complete by: As directed       Allergies as of 03/14/2019       Reactions   Ace Inhibitors Other (See Comments)   Acute renal failure (lisinopril)   Cephalexin Anaphylaxis   Dilaudid [hydromorphone Hcl]    Macrobid [nitrofurantoin] Other (See Comments)   Causes hyperthermia   Robaxin [methocarbamol]    Codeine Nausea Only   Latex Rash   Sulfa Antibiotics Other (See Comments)   Do not take per nephrology        Medication List     STOP taking these medications    meclizine 25 MG tablet Commonly known as: ANTIVERT   methylphenidate 20 MG tablet Commonly known as: Ritalin       TAKE these medications    ALPRAZolam 0.5 MG  tablet Commonly known as: XANAX TAKE 1/2 TO 1 TABLET BY MOUTH THREE TIMES A DAY IF NEEDED FOR ANXIETY OR SLEEP What changed:  how much to take how to take this when to take this reasons to take this additional instructions   aspirin EC 81 MG tablet Take 81 mg by mouth daily.   atenolol 100 MG tablet Commonly known as: TENORMIN Take 1 tablet Daily for BP What changed:  how much to take how to take this when to take this additional instructions   buPROPion 150 MG 24 hr tablet Commonly known as: WELLBUTRIN XL Take 3 tablets (450 mg) every Morning for Mood & Depression   dicyclomine 20 MG tablet Commonly known as: BENTYL Take 1/2 to 1 tablet 3 x /day before Meals for ADD What changed:  how much to take how to take this when to take this additional instructions   docusate sodium 100 MG capsule Commonly known as: COLACE Take 100 mg by mouth 2 (two) times daily as needed for mild constipation.   famotidine 20 MG tablet Commonly known as: PEPCID Take 1 tablet (20 mg total) by mouth 2 (two) times daily. What changed: when to take this   hyoscyamine 0.375 MG 12 hr tablet Commonly known as: LEVBID TAKE 1 TO 2 TABLETS BY MOUTH TWICE DAILY What changed:  Another medication with the same name was changed. Make sure you understand how and when to take each.   hyoscyamine 0.125 MG SL tablet Commonly known as: LEVSIN SL PLACE 1 TABLET (0.125 MG TOTAL) UNDER THE TONGUE EVERY 4 (FOUR) HOURS AS NEEDED. What changed: See the new instructions.   levothyroxine 125 MCG tablet Commonly known as: SYNTHROID Take 1 tablet daily on an empty stomach with only water for 30 minutes & no Antacid meds for 4 hours What changed:  how much to take how to take this when to take this additional instructions   Magnesium 250 MG Tabs Take 250 mg by mouth daily.   ondansetron 8 MG tablet Commonly known as: ZOFRAN TAKE 1 TABLET BY MOUTH 3 TIMES A DAY AS NEEDED FOR NAUSEA What changed:  how  much to take how to take this when to take this reasons to take this additional instructions   pantoprazole 40 MG tablet Commonly known as: PROTONIX Take 1 tablet (40 mg total) by mouth daily.   sertraline 100 MG tablet Commonly known as: ZOLOFT Take 1 tablet Daily for Mood / Depression What changed:  how much to take how to take this when to take this additional instructions   Vitamin D-3 125 MCG (5000 UT) Tabs Take 5,000 Units by mouth daily.       ASK your doctor about these medications    methocarbamol 500 MG tablet Commonly known as: ROBAXIN Take 500 mg by mouth 4 (four) times daily. Ask about: Should I take this medication?       Follow-up Information     Izora Gala, MD. Call.   Specialty: Otolaryngology Why: Call if needed Contact information: [address] [place] [address] [phone]           Allergies  Allergen Reactions   Ace Inhibitors Other (See Comments)    Acute renal failure (lisinopril)   Cephalexin Anaphylaxis   Dilaudid [Hydromorphone Hcl] Anaphylaxis   Robaxin [Methocarbamol] Anaphylaxis   Macrobid [Nitrofurantoin] Other (See Comments)    Causes hyperthermia    Codeine Nausea Only   Latex Rash   Sulfa Antibiotics Other (See Comments)    Do not take per nephrology    Consultations: ENT Gastroenterology   Procedures/Studies: Ct Soft Tissue Neck Wo Contrast  Result Date: 03/09/2019 CLINICAL DATA:  Facial swelling, vomiting, pain, trouble swallowing EXAM: CT NECK WITHOUT CONTRAST TECHNIQUE: Multidetector CT imaging of the neck was performed following the standard protocol without intravenous contrast. COMPARISON:  None. FINDINGS: Motion and streak artifact are present. Pharynx and larynx: Unremarkable within the above limitation. Salivary glands: Submandibular and parotid glands are unremarkable. Thyroid: Small.  Not well evaluated. Lymph nodes: No pathologically enlarged lymph nodes. Vascular:  Calcified plaque at the common carotid bifurcations. Limited intracranial: No acute or unexpected abnormality. Visualized orbits: Minimally imaged. Mastoids and visualized paranasal sinuses: Aerated. Skeleton: Degenerative changes of the cervical spine. Upper chest: Better evaluated on concurrent dedicated chest imaging. Other: None. IMPRESSION: No mass or adenopathy.  Suboptimal evaluation due to artifact Electronically Signed   By: Macy Mis M.D.   On: 03/09/2019 10:29   Ct Chest Wo Contrast  Result Date: 03/09/2019 CLINICAL DATA:  Surgery 2 days ago for breast implant removal. Last night with vomiting and face swelling. Pain everywhere. Patient also with shortness of breath and trouble swallowing. EXAM: CT CHEST WITHOUT CONTRAST TECHNIQUE: Multidetector CT imaging of the chest was performed following the standard protocol without IV contrast. COMPARISON:  Chest  CT, 08/30/2003.  Current chest radiograph. FINDINGS: Cardiovascular: Heart borderline enlarged. No pericardial effusion. Left coronary artery atherosclerotic calcifications. Mild aortic atherosclerotic calcifications across the arch. Mediastinum/Nodes: No mediastinal or hilar masses. No enlarged lymph nodes. Posterior aspect of the tracheal wall is indented consistent with and expiratory phase study. Trachea is patent. Esophagus is unremarkable. Lungs/Pleura: Lungs demonstrate upper lobe paraseptal and centrilobular emphysema. Paraseptal emphysema is also noted posteriorly in the lower lobes. There are areas of segmental bronchial wall thickening and mucous plugging, most evident in the lower lobes. Mild heterogeneous peripheral interstitial thickening is noted bilaterally. There is also mild hazy increased attenuation in the upper lobes, that is likely due to air trapping from the low lung volumes. No convincing pneumonia. There is no evidence of pulmonary edema. No lung mass and no suspicious nodule. No pleural effusion or pneumothorax. Upper  Abdomen: No definite acute finding. Gallbladder wall is not well-defined. Cannot exclude wall thickening. Musculoskeletal: Bilateral anterior chest wall surgical drains. No chest lateral collection to suggest an abscess or seroma/hematoma. Chronic appearing mild compression fracture of L1. No acute fractures. No osteoblastic or osteolytic lesions. There are degenerative changes throughout the thoracic spine. IMPRESSION: 1. No evidence of pneumonia or pulmonary edema. 2. Bilateral areas of segmental bronchus mucous plugging. Findings that support associated air trapping accentuated by the expiratory timing of this exam. 3. Mild chronic centrilobular and paraseptal emphysema. 4. Postsurgical changes along the anterior chest wall. No evidence of a surgical complication. 5. Left coronary artery and aortic atherosclerotic calcifications. 6. Gallbladder wall is ill-defined. If there is right upper quadrant pain, consider follow-up limited right upper quadrant ultrasound for further assessment. Aortic Atherosclerosis (ICD10-I70.0) and Emphysema (ICD10-J43.9). Electronically Signed   By: Lajean Manes M.D.   On: 03/09/2019 10:30   Dg Chest Portable 1 View  Result Date: 03/09/2019 CLINICAL DATA:  Onset vomiting and swelling last night. Patient status post breast implant removal 03/07/2019. EXAM: PORTABLE CHEST 1 VIEW COMPARISON:  PA and lateral chest 08/26/2017. FINDINGS: Breast implants have been removed. Bilateral surgical drains are in place. The lungs are clear with emphysematous disease noted. No pneumothorax or pleural fluid. No acute or focal bony abnormality. Atherosclerosis is noted. IMPRESSION: No acute disease. Status post breast implant removal with bilateral surgical drains in place. Emphysema. Atherosclerosis. Electronically Signed   By: Inge Rise M.D.   On: 03/09/2019 09:22     EGD 11/15  Laryngoscopy 11/16    Subjective: Patient awake sitting up in bed.  She reports pain with swallowing  continues to improve.  She has figured out what foods go down more easily with less pain, states she will advance her diet slowly and as tolerated at home.  She denies fevers, chills, cough, chest pain, shortness of breath.  No acute events reported overnight.     Discharge Exam: Vitals:   03/13/19 2306 03/14/19 0729  BP: 124/79 (!) 143/87  Pulse: 63 61  Resp: 20 18  Temp: 98.4 F (36.9 C) 98.7 F (37.1 C)  SpO2: 99% 97%   Vitals:   03/13/19 0754 03/13/19 1725 03/13/19 2306 03/14/19 0729  BP: 138/76 137/86 124/79 (!) 143/87  Pulse: (!) 57 (!) 58 63 61  Resp: 19 18 20 18   Temp: 98.9 F (37.2 C) 98.9 F (37.2 C) 98.4 F (36.9 C) 98.7 F (37.1 C)  TempSrc: Oral Oral    SpO2: 98% 95% 99% 97%  Weight:      Height:        General: Pt  is alert, awake, not in acute distress Cardiovascular: RRR, S1/S2 +, no rubs, no gallops Respiratory: CTA bilaterally, no wheezing, no rhonchi Abdominal: Soft, NT, ND, bowel sounds + Extremities: no edema, no cyanosis    The results of significant diagnostics from this hospitalization (including imaging, microbiology, ancillary and laboratory) are listed below for reference.     Microbiology: No results found for this or any previous visit (from the past 240 hour(s)).    Labs: BNP (last 3 results) Recent Labs    03/09/19 0824  BNP [id number]*   Basic Metabolic Panel: No results for input(s): NA, K, CL, CO2, GLUCOSE, BUN, CREATININE, CALCIUM, MG, PHOS in the last 168 hours.  Liver Function Tests: No results for input(s): AST, ALT, ALKPHOS, BILITOT, PROT, ALBUMIN in the last 168 hours.  No results for input(s): LIPASE, AMYLASE in the last 168 hours. No results for input(s): AMMONIA in the last 168 hours. CBC: No results for input(s): WBC, NEUTROABS, HGB, HCT, MCV, PLT in the last 168 hours.  Cardiac Enzymes: No results for input(s): CKTOTAL, CKMB, CKMBINDEX, TROPONINI in the last 168 hours. BNP: Invalid input(s): POCBNP CBG: No  results for input(s): GLUCAP in the last 168 hours. D-Dimer No results for input(s): DDIMER in the last 72 hours. Hgb A1c No results for input(s): HGBA1C in the last 72 hours. Lipid Profile No results for input(s): CHOL, HDL, LDLCALC, TRIG, CHOLHDL, LDLDIRECT in the last 72 hours. Thyroid function studies No results for input(s): TSH, T4TOTAL, T3FREE, THYROIDAB in the last 72 hours.  Invalid input(s): FREET3  Anemia work up No results for input(s): VITAMINB12, FOLATE, FERRITIN, TIBC, IRON, RETICCTPCT in the last 72 hours. Urinalysis    Component Value Date/Time   COLORURINE YELLOW 03/10/2019 0919   APPEARANCEUR CLEAR 03/10/2019 0919   LABSPEC 1.020 03/10/2019 0919   PHURINE 5.0 03/10/2019 0919   GLUCOSEU NEGATIVE 03/10/2019 0919   HGBUR NEGATIVE 03/10/2019 0919   BILIRUBINUR NEGATIVE 03/10/2019 0919   KETONESUR NEGATIVE 03/10/2019 0919   PROTEINUR NEGATIVE 03/10/2019 0919   UROBILINOGEN 1 04/02/2014 1528   NITRITE NEGATIVE 03/10/2019 0919   LEUKOCYTESUR NEGATIVE 03/10/2019 0919   Sepsis Labs Invalid input(s): PROCALCITONIN,  WBC,  LACTICIDVEN Microbiology No results found for this or any previous visit (from the past 240 hour(s)).    Time coordinating discharge: Over 30 minutes  SIGNED:   Ezekiel Slocumb, DO [facility] 03/29/2019, 7:49 PM Pager [phone]  If 7PM-7AM, please contact night-coverage [email or web address] Password [email or web address]

## 2019-03-14 NOTE — Care Management (Signed)
CM spoke with pt via phone prior to discharge.  Pt confirms;  She is completely independent from home, has PCP and will contact directly for follow up appt, denied barriers with paying for prescription medications.  No CM needs determined at this time - CM signing off

## 2019-03-14 NOTE — Progress Notes (Addendum)
  Speech Language Pathology Treatment: Dysphagia  Patient Details Name: TORIE TOWLE MRN: [id number] DOB: [birth year] Today's Date: 03/14/2019 Time: 8563-1497 SLP Time Calculation (min) (ACUTE ONLY): 10 min  Assessment / Plan / Recommendation Clinical Impression  Pt has been on a regular diet and thin liquids since yesterday; reports ongoing solid food dysphagia, particularly with bread items. SLP and pt reviewed appropriate textures and strategies. Pt verbalizes excellent awareness of problem and appropriate food textures; she will be able to manage deficits independently from this point. We also discussed important reflux precautions to reduce episodes of laryngopharyngeal reflux and inflammation. All education complete, will sign off at this time.   HPI        SLP Plan  All goals met       Recommendations  Diet recommendations: Regular;Thin liquid Liquids provided via: Cup;Straw Medication Administration: Whole meds with puree Supervision: Patient able to self feed;Intermittent supervision to cue for compensatory strategies Compensations: Follow solids with liquid;Multiple dry swallows after each bite/sip Postural Changes and/or Swallow Maneuvers: Seated upright 90 degrees                Oral Care Recommendations: Oral care BID Follow up Recommendations: None SLP Visit Diagnosis: Dysphagia, oropharyngeal phase (R13.12) Plan: All goals met       GO               Herbie Baltimore, MA CCC-SLP  Acute Rehabilitation Services Pager [phone] Office [phone]  Lynann Beaver 03/14/2019, 11:35 AM

## 2019-03-15 ENCOUNTER — Telehealth: Payer: Self-pay | Admitting: *Deleted

## 2019-03-15 NOTE — Telephone Encounter (Signed)
I left a message for the patient to return my call.

## 2019-03-16 ENCOUNTER — Telehealth: Payer: Self-pay | Admitting: *Deleted

## 2019-03-16 NOTE — Telephone Encounter (Signed)
Called patient on 03/16/2019 , 9:34 AM in an attempt to reach the patient for a hospital follow up.   Admit date: 03/09/19 Discharge: 03/14/19   She does not have any questions or concerns about medications from the hospital admission. The patient's medications were reviewed over the phone, they were counseled to bring in all current medications to the hospital follow up visit. Patient has no questions regarding her medications.  I advised the patient to call if any questions or concerns arise about the hospital admission or medications    Home health was not started in the hospital.  All questions were answered and a follow up appointment was made. Patient has an appointment 03/22/2019 with Liane Comber, NP, for her follow up visit.  Prior to Admission medications   Medication Sig Start Date End Date Taking? Authorizing Provider  ALPRAZolam (XANAX) 0.5 MG tablet TAKE 1/2 TO 1 TABLET BY MOUTH THREE TIMES A DAY IF NEEDED FOR ANXIETY OR SLEEP Patient taking differently: Take 0.25-0.5 mg by mouth 3 (three) times daily as needed for anxiety or sleep.  07/12/18   Liane Comber, NP  aspirin EC 81 MG tablet Take 81 mg by mouth daily.    [provider]  atenolol (TENORMIN) 100 MG tablet Take 1 tablet Daily for BP Patient taking differently: Take 100 mg by mouth daily.  08/29/18   Unk [name], MD  buPROPion (WELLBUTRIN XL) 150 MG 24 hr tablet Take 3 tablets (450 mg) every Morning for Mood & Depression 12/19/18   Unk [name], MD  Cholecalciferol (VITAMIN D-3) 5000 UNITS TABS Take 5,000 Units by mouth daily.     [provider]  conjugated estrogens (PREMARIN) vaginal cream Place 1 Applicatorful vaginally daily.    [provider]  dicyclomine (BENTYL) 20 MG tablet Take 1/2 to 1 tablet 3 x /day before Meals for ADD Patient taking differently: Take 10-20 mg by mouth 3 (three) times daily before meals.  12/18/18   Unk [name], MD  docusate sodium (COLACE) 100 MG  capsule Take 100 mg by mouth 2 (two) times daily as needed for mild constipation.    [provider]  famotidine (PEPCID) 20 MG tablet Take 1 tablet (20 mg total) by mouth 2 (two) times daily. 03/14/19   Nicole Kindred A, DO  HYDROmorphone (DILAUDID) 2 MG tablet Take 2 mg by mouth every 4 (four) hours as needed for pain. 03/01/19   [provider]  hyoscyamine (LEVBID) 0.375 MG 12 hr tablet TAKE 1 TO 2 TABLETS BY MOUTH TWICE DAILY Patient not taking: Reported on 03/09/2019 05/04/18   Unk [name], MD  hyoscyamine (LEVSIN SL) 0.125 MG SL tablet PLACE 1 TABLET (0.125 MG TOTAL) UNDER THE TONGUE EVERY 4 (FOUR) HOURS AS NEEDED. Patient taking differently: Take 0.125 mg by mouth every 4 (four) hours as needed for cramping.  06/28/18   Unk [name], MD  levothyroxine (SYNTHROID, LEVOTHROID) 125 MCG tablet Take 1 tablet daily on an empty stomach with only water for 30 minutes & no Antacid meds for 4 hours Patient taking differently: Take 125 mcg by mouth daily before breakfast.  05/23/18   Unk [name], MD  Magnesium 250 MG TABS Take 250 mg by mouth daily.    [provider]  methocarbamol (ROBAXIN) 500 MG tablet Take 500 mg by mouth 4 (four) times daily. 03/08/19 03/18/19  [provider]  mirabegron ER (MYRBETRIQ) 25 MG TB24 tablet Take 25 mg by mouth daily.    [provider]  ondansetron [facility])  8 MG tablet TAKE 1 TABLET BY MOUTH 3 TIMES A DAY AS NEEDED FOR NAUSEA Patient taking differently: Take 8 mg by mouth 3 (three) times daily as needed for nausea.  06/28/18   Liane Comber, NP  pantoprazole (PROTONIX) 40 MG tablet Take 1 tablet (40 mg total) by mouth daily. 03/15/19   Ezekiel Slocumb, DO  sertraline (ZOLOFT) 100 MG tablet Take 1 tablet Daily for Mood / Depression Patient taking differently: Take 100 mg by mouth daily.  11/11/18   Unk [name], MD

## 2019-03-17 MED FILL — buPROPion HCL ER (XL) 150 M: 150 | 30 days supply | Qty: 90 | Fill #1

## 2019-03-21 NOTE — Progress Notes (Signed)
Hospital follow up  Assessment and Plan: Hospital visit follow up for:   [name] was seen today for follow-up.  Diagnoses and all orders for this visit:  Odynophagia/Dysphagia, unspecified type ? Multifactoral, r/t recent intubation, emesis following surgery, excess cola intake Per patient some question with anaphylactic reaction to baclofen or dilaudid though this wasn't apparent from hospital notes; per her request will add to allergy list and avoid ENT recommended PPI/H2i BID; will plan to continue 4-8 weeks then attempt taper of PPI  Follow up as scheduled or sooner if any concerns Refer back to outpatient ENT if persistent concerns for repeat laryngoscopy   CHF NYHA class II, acute, diastolic ([place]) Appears euvolemic today; no hx of CHF Suspect acute reaction; patient will follow up with cardiology  Monitor weight, sodium and fluid intake   Acute renal failure superimposed on stage 3a chronic kidney disease, unspecified acute renal failure type ([place]) She has been pushing water intake, avoiding cola -     COMPLETE METABOLIC PANEL WITH GFR  Transaminitis Monitor for resolution; avoid alcohol, excess tylenol -     COMPLETE METABOLIC PANEL WITH GFR  Medication management -     CBC with Diff -     COMPLETE METABOLIC PANEL WITH GFR -     Magnesium  Hypokalemia -     COMPLETE METABOLIC PANEL WITH GFR  Hypomagnesemia -     Magnesium  Needs flu shot -     Flu vaccine HIGH DOSE PF (Fluzone High dose)  Atherosclerosis of aorta ([place]) Per CT 03/09/2019 Control blood pressure, cholesterol, glucose, increase exercise.   Centrilobular emphysemia ([place]) Per CT 03/09/2019 Monitor; initiate inhaler/refer to pulmonology PRN Denies notable symptoms today   All medications were reviewed with patient and family and fully reconciled. All questions answered fully, and patient and family members were encouraged to call the office with any further questions or concerns. Discussed goal to  avoid readmission related to this diagnosis.  Medications Discontinued During This Encounter  Medication Reason  . HYDROmorphone (DILAUDID) 2 MG tablet Allergic reaction  . mirabegron ER (MYRBETRIQ) 25 MG TB24 tablet Completed Course  . conjugated estrogens (PREMARIN) vaginal cream Completed Course    Over 40 minutes of exam, counseling, chart review, and complex, high/moderate level critical decision making was performed this visit.   Future Appointments  Date Time Provider [place]  04/19/2019  2:00 PM Unk [name], MD [facility] None  07/19/2019  3:00 PM Liane Comber, NP [facility] None     HPI 68 y.o.female presents for follow up for transition from recent hospitalization or SNIF stay. Admit date to the hospital was 03/09/19, patient was discharged from the hospital on 03/14/19 and our clinical staff contacted the office the day after discharge to set up a follow up appointment. The discharge summary, medications, and diagnostic test results were reviewed before meeting with the patient. The patient was admitted for:   Discharge Diagnoses: Principal Problem:   Pneumonia due to infectious agent Active Problems:   Hypothyroidism   ADHD (attention deficit hyperactivity disorder)   HTN (hypertension)   CKD Stage 3 (HCC)   Post-operative state   AKI (acute kidney injury) ([place])   Odynophagia   Dysphagia   Transaminitis   CHF NYHA class II, acute, diastolic ([place])  Recommendations for Outpatient Follow-up:  1. Follow up with PCP in 1-2 weeks 2. Please obtain BMP/CBC in one week  Home Health: No Equipment/Devices: None  Discharge Condition: Stable CODE STATUS: Full  Diet recommendation: Regular  Per discharge note:  Brief/Interim Summary:  68 year old Caucasian female with past medical history significant for COPD with emphysema, chronic left bundle branch block and CKD stage III with baseline serum creatinine of 1.09. Apparently, patient underwent  removal of breast implants on 03/08/19. The procedure was done under general anesthesia, and patient was intubated. Subsequently, patient developed worsening facial and neck swelling, vomiting, difficulty/pain on swallowing, and cough productive of white sputum. On presentation to the emergency room on 03/09/2019, patient was found to have fever of 102 degrees Fahrenheit, oxygen desaturation of 79% on room air requiring 2 L nasal cannula otherwise hemodynamically stable. High-sensitivity troponin of 622, CO2 on ABG of 56, lactic acid of 2.2, potassium 5.3, creatinine 2.03, BNP 418. CT chest showed evidence of chronic emphysema with mucous plugging but no evidence of pneumonia or pulmonary edema. CT neck showed no acute abnormalities. Patient was admitted for further assessment and management, treated with broad-spectrum antibiotics. Cardiology team was consulted and cardiac findings were thought to be demand ischemia. Patient will follow with cardiology on discharge.   During the hospital stay, facial and neck swelling have continued to improve, but with severe pain on swallowing. EGD on11/15/2020 revealed moderate inflammation characterized by congestion and shallow ulceration involving the cricopharyngeal area. ENT, Dr. Constance Holster consulted and patient underwent laryngoscopy.  ENTs assessment was symptoms due to a combination of hypopharyngeal burn due to intractable vomiting in addition to worsening reflux.  Recommended increasing H2 blocker to twice daily, and avoiding caffeine.  Patient to follow-up as needed with ENT as outpatient.  Patient did not tolerate a trial of viscous lidocaine.  Dysphagia and odynophagia: -Concluded likely secondary to cricopharyngeal inflammation.  -Patient underwent intubation about 3 daysprior to presentation. -Nausea, vomiting and the use of Coca-Cola (Coke) reported -Patient did not tolerate viscous lidocaine. -Herpes 1 and 2 PCR pending -ENT, Dr. Constance Holster  consulted, see documentation above. -Increase Pepcid from 20 mg p.o. once daily to twice daily. -Start Protonix 40 Mg p.o. once daily -Odynophagia is gradually improving.  - today patient reports she has mild residual discomfort with swallowing, otherwise tolerating fluid/food intake, will plan to continue PPI 4-8 weeks then attempt taper while continuing H2i BID  Cricopharyngeal inflammation:  -Please see above.  -H2 blocker/PPI as per ENT -Follow-up with ENT PRN  - will continue PPI 4-8 weeks then reevaluate for attempt to taper   Acute hypoxic respiratory failure: -Resolved. -CT chest done on presentation revealed bilateral areas of segmental bronchus mucous plugging, as well as, mild chronic centrilobular and paraseptal emphysema. -CT soft tissue of the neck did not reveal any significant findings, but was suboptimal study due to artifact.  -Doubt pneumonic process.  -Facial and neck swelling was also reported on presentation, but improved significantly   - per patient some question anaphylactic reaction to robaxin and/or dilaudid- will add to allergies list  Elevated troponin: -Cardiology input appreciated -Demand ischemia entertained. -No cardiac symptoms present. -Follow with cardiology on discharge for further work-up.  - she is established with Dr. Johnsie Cancel, will call to schedule follow up for stress test which was recommended at discharge.   Acute kidney injury on chronic kidney disease stage III:  -AKI has resolved.  -Possibly prerenal.  -Patient presented with odynophagia and dysphagia, therefore, may have had poor p.o. intake.   Lab Results  Component Value Date   NA 140 03/14/2019   K 3.3 (L) 03/14/2019   CL 103 03/14/2019   CO2 24 03/14/2019   GLUCOSE 106 (H) 03/14/2019  BUN 9 03/14/2019   CREATININE 0.96 03/14/2019   CALCIUM 8.2 (L) 03/14/2019   GFRAA >60 03/14/2019   GFRNONAA >60 03/14/2019    Status post removal of breast implant on  03/08/2019. Has some residual burning above the breast ongoing since prior of removal, known leak/rupture. Has improved.   Transaminitis: -Continue to monitor closely. Lab Results  Component Value Date   ALT 169 (H) 03/13/2019   AST 76 (H) 03/13/2019   ALKPHOS 67 03/13/2019   BILITOT 0.8 03/13/2019   Will recheck today, denies abdominal pain.   Hypokalemia: Last potassium was 3.3 Check magnesium level (11/17 - 1.4) , Replacepotassium  Lab Results  Component Value Date   NA 140 03/14/2019   K 3.3 (L) 03/14/2019   CL 103 03/14/2019   CO2 24 03/14/2019   GLUCOSE 106 (H) 03/14/2019   BUN 9 03/14/2019   CREATININE 0.96 03/14/2019   CALCIUM 8.2 (L) 03/14/2019   GFRAA >60 03/14/2019   GFRNONAA >60 03/14/2019   Grade 1 diastolic CHF: -Stable. -No current CHF symptoms.  She does NOT a history of Diastolic CHF Recent ECHO [id number] LVEF estimated 55-60%. Abnormal (paradoxical) septal motion, consistent with left bundle branch block (long history of this on EKG). Grade I diastolic dysfunction  Denies dyspnea on exertion, orthopnea, paroxysmal nocturnal dyspnea and edema. Positive for none.  Wt Readings from Last 3 Encounters:  03/22/19 158 lb (71.7 kg)  03/13/19 173 lb 4.5 oz (78.6 kg)  11/15/18 162 lb 3.2 oz (73.6 kg)    SIRS: -Resolved.  Lab Results  Component Value Date   WBC 5.8 03/14/2019   HGB 13.4 03/14/2019   HCT 40.2 03/14/2019   MCV 95.7 03/14/2019   PLT 250 03/14/2019   Lactic acidosis: -Resolved.  Peaked at 2.2.  Chronic Emphysema: -No exacerbation noted today.  -Recent mucous plugging noted.  -Continue current management.   Normocytic anemia: -Continue to monitor. - Appears resolved  Lab Results  Component Value Date   WBC 5.8 03/14/2019   HGB 13.4 03/14/2019   HCT 40.2 03/14/2019   MCV 95.7 03/14/2019   PLT 250 03/14/2019   Hypertension: -Controlled.  Her blood pressure has been controlled at home, today their BP is BP: 130/70   She does workout. She denies chest pain, shortness of breath, dizziness.  She is on synthroid; recent check was normal  Lab Results  Component Value Date   TSH 0.495 03/11/2019    Home health is involved.    Images while in the hospital: Ct Soft Tissue Neck Wo Contrast  Result Date: 03/09/2019 CLINICAL DATA:  Facial swelling, vomiting, pain, trouble swallowing EXAM: CT NECK WITHOUT CONTRAST TECHNIQUE: Multidetector CT imaging of the neck was performed following the standard protocol without intravenous contrast. COMPARISON:  None. FINDINGS: Motion and streak artifact are present. Pharynx and larynx: Unremarkable within the above limitation. Salivary glands: Submandibular and parotid glands are unremarkable. Thyroid: Small.  Not well evaluated. Lymph nodes: No pathologically enlarged lymph nodes. Vascular: Calcified plaque at the common carotid bifurcations. Limited intracranial: No acute or unexpected abnormality. Visualized orbits: Minimally imaged. Mastoids and visualized paranasal sinuses: Aerated. Skeleton: Degenerative changes of the cervical spine. Upper chest: Better evaluated on concurrent dedicated chest imaging. Other: None. IMPRESSION: No mass or adenopathy.  Suboptimal evaluation due to artifact Electronically Signed   By: Macy Mis M.D.   On: 03/09/2019 10:29   Ct Chest Wo Contrast  Result Date: 03/09/2019 CLINICAL DATA:  Surgery 2 days ago for breast implant removal. Last  night with vomiting and face swelling. Pain everywhere. Patient also with shortness of breath and trouble swallowing. EXAM: CT CHEST WITHOUT CONTRAST TECHNIQUE: Multidetector CT imaging of the chest was performed following the standard protocol without IV contrast. COMPARISON:  Chest CT, 08/30/2003.  Current chest radiograph. FINDINGS: Cardiovascular: Heart borderline enlarged. No pericardial effusion. Left coronary artery atherosclerotic calcifications. Mild aortic atherosclerotic calcifications across the  arch. Mediastinum/Nodes: No mediastinal or hilar masses. No enlarged lymph nodes. Posterior aspect of the tracheal wall is indented consistent with and expiratory phase study. Trachea is patent. Esophagus is unremarkable. Lungs/Pleura: Lungs demonstrate upper lobe paraseptal and centrilobular emphysema. Paraseptal emphysema is also noted posteriorly in the lower lobes. There are areas of segmental bronchial wall thickening and mucous plugging, most evident in the lower lobes. Mild heterogeneous peripheral interstitial thickening is noted bilaterally. There is also mild hazy increased attenuation in the upper lobes, that is likely due to air trapping from the low lung volumes. No convincing pneumonia. There is no evidence of pulmonary edema. No lung mass and no suspicious nodule. No pleural effusion or pneumothorax. Upper Abdomen: No definite acute finding. Gallbladder wall is not well-defined. Cannot exclude wall thickening. Musculoskeletal: Bilateral anterior chest wall surgical drains. No chest lateral collection to suggest an abscess or seroma/hematoma. Chronic appearing mild compression fracture of L1. No acute fractures. No osteoblastic or osteolytic lesions. There are degenerative changes throughout the thoracic spine. IMPRESSION: 1. No evidence of pneumonia or pulmonary edema. 2. Bilateral areas of segmental bronchus mucous plugging. Findings that support associated air trapping accentuated by the expiratory timing of this exam. 3. Mild chronic centrilobular and paraseptal emphysema. 4. Postsurgical changes along the anterior chest wall. No evidence of a surgical complication. 5. Left coronary artery and aortic atherosclerotic calcifications. 6. Gallbladder wall is ill-defined. If there is right upper quadrant pain, consider follow-up limited right upper quadrant ultrasound for further assessment. Aortic Atherosclerosis (ICD10-I70.0) and Emphysema (ICD10-J43.9). Electronically Signed   By: Lajean Manes M.D.    On: 03/09/2019 10:30   Dg Chest Portable 1 View  Result Date: 03/09/2019 CLINICAL DATA:  Onset vomiting and swelling last night. Patient status post breast implant removal 03/07/2019. EXAM: PORTABLE CHEST 1 VIEW COMPARISON:  PA and lateral chest 08/26/2017. FINDINGS: Breast implants have been removed. Bilateral surgical drains are in place. The lungs are clear with emphysematous disease noted. No pneumothorax or pleural fluid. No acute or focal bony abnormality. Atherosclerosis is noted. IMPRESSION: No acute disease. Status post breast implant removal with bilateral surgical drains in place. Emphysema. Atherosclerosis. Electronically Signed   By: Inge Rise M.D.   On: 03/09/2019 09:22     Current Outpatient Medications (Endocrine & Metabolic):  .  levothyroxine (SYNTHROID, LEVOTHROID) 125 MCG tablet, Take 1 tablet daily on an empty stomach with only water for 30 minutes & no Antacid meds for 4 hours (Patient taking differently: Take 125 mcg by mouth daily before breakfast. )  Current Outpatient Medications (Cardiovascular):  .  atenolol (TENORMIN) 100 MG tablet, Take 1 tablet Daily for BP (Patient taking differently: Take 100 mg by mouth daily. )   Current Outpatient Medications (Analgesics):  .  aspirin EC 81 MG tablet, Take 81 mg by mouth daily.   Current Outpatient Medications (Other):  Marland Kitchen  ALPRAZolam (XANAX) 0.5 MG tablet, TAKE 1/2 TO 1 TABLET BY MOUTH THREE TIMES A DAY IF NEEDED FOR ANXIETY OR SLEEP (Patient taking differently: Take 0.25-0.5 mg by mouth 3 (three) times daily as needed for anxiety or sleep. ) .  buPROPion (WELLBUTRIN XL) 150 MG 24 hr tablet, Take 3 tablets (450 mg) every Morning for Mood & Depression .  Cholecalciferol (VITAMIN D-3) 5000 UNITS TABS, Take 5,000 Units by mouth daily.  Marland Kitchen  dicyclomine (BENTYL) 20 MG tablet, Take 1/2 to 1 tablet 3 x /day before Meals for ADD (Patient taking differently: Take 10-20 mg by mouth 3 (three) times daily before meals. ) .   docusate sodium (COLACE) 100 MG capsule, Take 100 mg by mouth 2 (two) times daily as needed for mild constipation. .  famotidine (PEPCID) 20 MG tablet, Take 1 tablet (20 mg total) by mouth 2 (two) times daily. .  hyoscyamine (LEVBID) 0.375 MG 12 hr tablet, TAKE 1 TO 2 TABLETS BY MOUTH TWICE DAILY .  hyoscyamine (LEVSIN SL) 0.125 MG SL tablet, PLACE 1 TABLET (0.125 MG TOTAL) UNDER THE TONGUE EVERY 4 (FOUR) HOURS AS NEEDED. (Patient taking differently: Take 0.125 mg by mouth every 4 (four) hours as needed for cramping. ) .  Magnesium 250 MG TABS, Take 250 mg by mouth daily. .  ondansetron (ZOFRAN) 8 MG tablet, TAKE 1 TABLET BY MOUTH 3 TIMES A DAY AS NEEDED FOR NAUSEA (Patient taking differently: Take 8 mg by mouth 3 (three) times daily as needed for nausea. ) .  pantoprazole (PROTONIX) 40 MG tablet, Take 1 tablet (40 mg total) by mouth daily. .  sertraline (ZOLOFT) 100 MG tablet, Take 1 tablet Daily for Mood / Depression (Patient taking differently: Take 100 mg by mouth daily. )  Past Medical History:  Diagnosis Date  . AKI (acute kidney injury) ([place]) 03/09/2019  . Chronic kidney disease (CKD), stage III (moderate)   . Diverticulosis   . Emphysema lung ([place])   . Helicobacter pylori (H. pylori)   . IBS (irritable bowel syndrome)   . LBBB (left bundle branch block)   . Lower urinary tract infection 06/21/2017  . Migraines   . OCD (obsessive compulsive disorder)   . Prediabetes    pcp  . Renal disorder    AKI due to lisinpril, his. pyelonephritis  . Sepsis ([place])   . Sleep apnea    no CPAP  . Vitamin D deficiency      Allergies  Allergen Reactions  . Ace Inhibitors Other (See Comments)    Acute renal failure (lisinopril)  . Cephalexin Anaphylaxis  . Dilaudid [Hydromorphone Hcl] Anaphylaxis  . Robaxin [Methocarbamol] Anaphylaxis  . Macrobid [Nitrofurantoin] Other (See Comments)    Causes hyperthermia   . Codeine Nausea Only  . Latex Rash  . Sulfa Antibiotics Other (See Comments)     Do not take per nephrology    ROS: all negative except above.   Physical Exam: Filed Weights   03/22/19 1501  Weight: 158 lb (71.7 kg)   BP 130/70   Pulse 74   Temp 97.9 F (36.6 C)   Wt 158 lb (71.7 kg)   SpO2 93%   BMI 27.99 kg/m  General Appearance: Well nourished, in no apparent distress. Eyes: PERRLA, EOMs, conjunctiva no swelling or erythema Sinuses: No Frontal/maxillary tenderness  ENT/Mouth: Ext aud canals clear, TMs without erythema, bulging. No erythema, swelling, or exudate on post pharynx.  Tonsils not swollen or erythematous. Hearing normal.  Neck: Supple, thyroid normal.  Respiratory: Respiratory effort normal, BS equal bilaterally without rales, rhonchi, wheezing or stridor.  Cardio: RRR with no MRGs. Brisk peripheral pulses without edema.  Abdomen: Soft, + BS.  Non tender, no guarding, rebound, hernias, masses. Lymphatics: Non tender without lymphadenopathy.  Musculoskeletal: Full ROM, 5/5 strength, normal gait.  Skin: Warm, dry without rashes, lesions, ecchymosis.  Neuro: Cranial nerves intact. Normal muscle tone, no cerebellar symptoms. Sensation intact.  Psych: Awake and oriented X 3, normal affect, Insight and Judgment appropriate.     Izora Ribas, NP 3:35 PM [facility]

## 2019-03-22 ENCOUNTER — Other Ambulatory Visit: Payer: Self-pay

## 2019-03-22 ENCOUNTER — Ambulatory Visit (INDEPENDENT_AMBULATORY_CARE_PROVIDER_SITE_OTHER): Payer: PPO | Admitting: Adult Health

## 2019-03-22 ENCOUNTER — Encounter: Payer: Self-pay | Admitting: Adult Health

## 2019-03-22 VITALS — BP 130/70 | HR 74 | Temp 97.9°F | Wt 158.0 lb

## 2019-03-22 DIAGNOSIS — N179 Acute kidney failure, unspecified: Secondary | ICD-10-CM | POA: Diagnosis not present

## 2019-03-22 DIAGNOSIS — Z23 Encounter for immunization: Secondary | ICD-10-CM

## 2019-03-22 DIAGNOSIS — I700 Atherosclerosis of aorta: Secondary | ICD-10-CM | POA: Insufficient documentation

## 2019-03-22 DIAGNOSIS — J432 Centrilobular emphysema: Secondary | ICD-10-CM | POA: Diagnosis not present

## 2019-03-22 DIAGNOSIS — Z79899 Other long term (current) drug therapy: Secondary | ICD-10-CM | POA: Diagnosis not present

## 2019-03-22 DIAGNOSIS — R7401 Elevation of levels of liver transaminase levels: Secondary | ICD-10-CM | POA: Diagnosis not present

## 2019-03-22 DIAGNOSIS — E876 Hypokalemia: Secondary | ICD-10-CM

## 2019-03-22 DIAGNOSIS — R1310 Dysphagia, unspecified: Secondary | ICD-10-CM

## 2019-03-22 DIAGNOSIS — I5031 Acute diastolic (congestive) heart failure: Secondary | ICD-10-CM | POA: Diagnosis not present

## 2019-03-22 DIAGNOSIS — N1831 Chronic kidney disease, stage 3a: Secondary | ICD-10-CM | POA: Diagnosis not present

## 2019-03-22 DIAGNOSIS — J439 Emphysema, unspecified: Secondary | ICD-10-CM | POA: Insufficient documentation

## 2019-03-22 NOTE — Patient Instructions (Addendum)
  Can get shingrix at [facility] or [facility] - 2 part shot   Please schedule follow up with Dr. Johnsie Cancel for stress test - this was recommended due to elevated troponin/demand ischemia to check for narrow coronary arteries     Zoster Vaccine, Recombinant injection What is this medicine? ZOSTER VACCINE (ZOS ter vak SEEN) is used to prevent shingles in adults 66 years old and over. This vaccine is not used to treat shingles or nerve pain from shingles. This medicine may be used for other purposes; ask your health care provider or pharmacist if you have questions. COMMON BRAND NAME(S): [facility] [employer] What should I tell my health care provider before I take this medicine? They need to know if you have any of these conditions:  blood disorders or disease  cancer like leukemia or lymphoma  immune system problems or therapy  an unusual or allergic reaction to vaccines, other medications, foods, dyes, or preservatives  pregnant or trying to get pregnant  breast-feeding How should I use this medicine? This vaccine is for injection in a muscle. It is given by a health care professional. Talk to your pediatrician regarding the use of this medicine in children. This medicine is not approved for use in children. Overdosage: If you think you have taken too much of this medicine contact a poison control center or emergency room at once. NOTE: This medicine is only for you. Do not share this medicine with others. What if I miss a dose? Keep appointments for follow-up (booster) doses as directed. It is important not to miss your dose. Call your doctor or health care professional if you are unable to keep an appointment. What may interact with this medicine?  medicines that suppress your immune system  medicines to treat cancer  steroid medicines like prednisone or cortisone This list may not describe all possible interactions. Give your health care provider a list of all the medicines, herbs,  non-prescription drugs, or dietary supplements you use. Also tell them if you smoke, drink alcohol, or use illegal drugs. Some items may interact with your medicine. What should I watch for while using this medicine? Visit your doctor for regular check ups. This vaccine, like all vaccines, may not fully protect everyone. What side effects may I notice from receiving this medicine? Side effects that you should report to your doctor or health care professional as soon as possible:  allergic reactions like skin rash, itching or hives, swelling of the face, lips, or tongue  breathing problems Side effects that usually do not require medical attention (report these to your doctor or health care professional if they continue or are bothersome):  chills  headache  fever  nausea, vomiting  redness, warmth, pain, swelling or itching at site where injected  tiredness This list may not describe all possible side effects. Call your doctor for medical advice about side effects. You may report side effects to FDA at 1-800-FDA-1088. Where should I keep my medicine? This vaccine is only given in a clinic, pharmacy, doctor's office, or other health care setting and will not be stored at home. NOTE: This sheet is a summary. It may not cover all possible information. If you have questions about this medicine, talk to your doctor, pharmacist, or health care provider.  2020 Elsevier/Gold Standard (2016-11-23 13:20:30)

## 2019-03-23 LAB — CBC WITH DIFFERENTIAL/PLATELET
Absolute Monocytes: 502 cells/uL (ref 200–950)
Basophils Absolute: 53 cells/uL (ref 0–200)
Basophils Relative: 0.8 %
Eosinophils Absolute: 383 cells/uL (ref 15–500)
Eosinophils Relative: 5.8 %
HCT: 40.1 % (ref 35.0–45.0)
Hemoglobin: 13.2 g/dL (ref 11.7–15.5)
Lymphs Abs: 1967 cells/uL (ref 850–3900)
MCH: 31.1 pg (ref 27.0–33.0)
MCHC: 32.9 g/dL (ref 32.0–36.0)
MCV: 94.6 fL (ref 80.0–100.0)
MPV: 10.1 fL (ref 7.5–12.5)
Monocytes Relative: 7.6 %
Neutro Abs: 3696 cells/uL (ref 1500–7800)
Neutrophils Relative %: 56 %
Platelets: 393 10*3/uL (ref 140–400)
RBC: 4.24 10*6/uL (ref 3.80–5.10)
RDW: 13 % (ref 11.0–15.0)
Total Lymphocyte: 29.8 %
WBC: 6.6 10*3/uL (ref 3.8–10.8)

## 2019-03-23 LAB — COMPLETE METABOLIC PANEL WITH GFR
AG Ratio: 1.4 (calc) (ref 1.0–2.5)
ALT: 19 U/L (ref 6–29)
AST: 12 U/L (ref 10–35)
Albumin: 3.7 g/dL (ref 3.6–5.1)
Alkaline phosphatase (APISO): 75 U/L (ref 37–153)
BUN/Creatinine Ratio: 25 (calc) — ABNORMAL HIGH (ref 6–22)
BUN: 31 mg/dL — ABNORMAL HIGH (ref 7–25)
CO2: 29 mmol/L (ref 20–32)
Calcium: 9.7 mg/dL (ref 8.6–10.4)
Chloride: 102 mmol/L (ref 98–110)
Creat: 1.26 mg/dL — ABNORMAL HIGH (ref 0.50–0.99)
GFR, Est African American: 51 mL/min/{1.73_m2} — ABNORMAL LOW (ref >=60)
GFR, Est Non African American: 44 mL/min/{1.73_m2} — ABNORMAL LOW (ref >=60)
Globulin: 2.7 g/dL (calc) (ref 1.9–3.7)
Glucose, Bld: 91 mg/dL (ref 65–99)
Potassium: 4.4 mmol/L (ref 3.5–5.3)
Sodium: 138 mmol/L (ref 135–146)
Total Bilirubin: 0.3 mg/dL (ref 0.2–1.2)
Total Protein: 6.4 g/dL (ref 6.1–8.1)

## 2019-03-23 LAB — MAGNESIUM: Magnesium: 2.1 mg/dL (ref 1.5–2.5)

## 2019-04-03 ENCOUNTER — Telehealth: Payer: Self-pay | Admitting: *Deleted

## 2019-04-03 NOTE — Telephone Encounter (Signed)
I left a message for the patient to return my call.

## 2019-04-03 NOTE — Telephone Encounter (Signed)
Called patient on 04/03/2019 , 2:27 PM in an attempt to reach the patient for a hospital follow up.   Admit date: 03/09/19 Discharge: 03/14/19   She does not have any questions or concerns about medications from the hospital admission. The patient's medications were reviewed over the phone, they were counseled to bring in all current medications to the hospital follow up visit.   I advised the patient to call if any questions or concerns arise about the hospital admission or medications    Home health was not started in the hospital.  All questions were answered and a follow up appointment was made. The patient was discharged on 03/14/2019 and had a hospital follow visit with Liane Comber, NP, on 03/22/2019. There was an addendum written on 12/02/200.  No additional visit needed.  Prior to Admission medications   Medication Sig Start Date End Date Taking? Authorizing Provider  ALPRAZolam (XANAX) 0.5 MG tablet TAKE 1/2 TO 1 TABLET BY MOUTH THREE TIMES A DAY IF NEEDED FOR ANXIETY OR SLEEP Patient taking differently: Take 0.25-0.5 mg by mouth 3 (three) times daily as needed for anxiety or sleep.  07/12/18   Liane Comber, NP  aspirin EC 81 MG tablet Take 81 mg by mouth daily.    [provider]  atenolol (TENORMIN) 100 MG tablet Take 1 tablet Daily for BP Patient taking differently: Take 100 mg by mouth daily.  08/29/18   Unk [name], MD  buPROPion (WELLBUTRIN XL) 150 MG 24 hr tablet Take 3 tablets (450 mg) every Morning for Mood & Depression 12/19/18   Unk [name], MD  Cholecalciferol (VITAMIN D-3) 5000 UNITS TABS Take 5,000 Units by mouth daily.     [provider]  dicyclomine (BENTYL) 20 MG tablet Take 1/2 to 1 tablet 3 x /day before Meals for ADD Patient taking differently: Take 10-20 mg by mouth 3 (three) times daily before meals.  12/18/18   Unk [name], MD  docusate sodium (COLACE) 100 MG capsule Take 100 mg by mouth 2 (two) times daily as needed for mild  constipation.    [provider]  famotidine (PEPCID) 20 MG tablet Take 1 tablet (20 mg total) by mouth 2 (two) times daily. 03/14/19   Ezekiel Slocumb, DO  hyoscyamine (LEVBID) 0.375 MG 12 hr tablet TAKE 1 TO 2 TABLETS BY MOUTH TWICE DAILY 05/04/18   Unk [name], MD  hyoscyamine (LEVSIN SL) 0.125 MG SL tablet PLACE 1 TABLET (0.125 MG TOTAL) UNDER THE TONGUE EVERY 4 (FOUR) HOURS AS NEEDED. Patient taking differently: Take 0.125 mg by mouth every 4 (four) hours as needed for cramping.  06/28/18   Unk [name], MD  levothyroxine (SYNTHROID, LEVOTHROID) 125 MCG tablet Take 1 tablet daily on an empty stomach with only water for 30 minutes & no Antacid meds for 4 hours Patient taking differently: Take 125 mcg by mouth daily before breakfast.  05/23/18   Unk [name], MD  Magnesium 250 MG TABS Take 250 mg by mouth daily.    [provider]  ondansetron (ZOFRAN) 8 MG tablet TAKE 1 TABLET BY MOUTH 3 TIMES A DAY AS NEEDED FOR NAUSEA Patient taking differently: Take 8 mg by mouth 3 (three) times daily as needed for nausea.  06/28/18   Liane Comber, NP  pantoprazole (PROTONIX) 40 MG tablet Take 1 tablet (40 mg total) by mouth daily. 03/15/19   Nicole Kindred A, DO  sertraline (ZOLOFT) 100 MG tablet Take 1 tablet Daily for Mood / Depression Patient taking differently: Take  100 mg by mouth daily.  11/11/18   Unk [name], MD

## 2019-04-18 MED FILL — FAMOTIDINE 20 MG TABS: 20 | 30 days supply | Qty: 60 | Fill #1

## 2019-04-18 MED FILL — buPROPion HCL ER (XL) 150 M: 150 | 30 days supply | Qty: 90 | Fill #2

## 2019-04-19 ENCOUNTER — Ambulatory Visit: Payer: PPO | Admitting: Internal Medicine

## 2019-04-19 ENCOUNTER — Encounter: Payer: Self-pay | Admitting: Internal Medicine

## 2019-04-19 NOTE — Progress Notes (Signed)
    R  E  S  C  H  E  D  U  L  E  D   D U  E          T  O  C O  V  I  D            E  X  P  O  S  U  R E                                                                                                                                                                                                          This very nice 68 y.o. DWF  presents for a Screening /Preventative Visit & comprehensive evaluation and management of multiple medical co-morbidities.  Patient has been followed for HTN, HLD, Prediabetes  and Vitamin D Deficiency.     In Mar 202, patient had evaluation for cognitive issues and she had a positive RPR (titer 1:!) and a subsequent FTA-Abs. Then she had an Infectious Disease evaluation at [facility] in [place] & had a LP negative for RPR.  She was then treated with 3 dose of Bicillin 2.4 million sunits.      In early November, patient had out -patient removal of breast implants and became febrile , suspected sepsis/SIRS and has hospitalized & treated with IVF & Abx's , improved & was discharged.  She did have mild Transaminitis which resolved. She also had acute kidney injury which recovered with IVF.      HTN predates since     . Patient's BP has been controlled at home and patient denies any cardiac symptoms as chest pain, palpitations, shortness of breath, dizziness or ankle swelling. Today's        Patient's hyperlipidemia is controlled with diet and medications. Patient denies myalgias or other medication SE's. Last lipids were not at goal:  Lab Results  Component Value Date   CHOL 194 06/29/2018   HDL 66 06/29/2018   LDLCALC 108 (H) 06/29/2018   TRIG 104 06/29/2018   CHOLHDL 2.9 06/29/2018      Patient has hx/o prediabetes predating since      and patient denies reactive hypoglycemic  symptoms, visual blurring, diabetic polys or paresthesias. Last A1c was  Lab Results  Component Value Date   HGBA1C 5.4 03/22/2018      Finally, patient has history of Vitamin D Deficiency and last Vitamin D was at goal:  Lab Results  Component Value Date   VD25OH 80 03/22/2018

## 2019-04-20 ENCOUNTER — Telehealth: Payer: PPO | Admitting: Nurse Practitioner

## 2019-04-20 DIAGNOSIS — Z20822 Contact with and (suspected) exposure to covid-19: Secondary | ICD-10-CM

## 2019-04-20 DIAGNOSIS — R059 Cough, unspecified: Secondary | ICD-10-CM

## 2019-04-20 DIAGNOSIS — R52 Pain, unspecified: Secondary | ICD-10-CM

## 2019-04-20 DIAGNOSIS — Z9189 Other specified personal risk factors, not elsewhere classified: Secondary | ICD-10-CM

## 2019-04-20 DIAGNOSIS — R05 Cough: Secondary | ICD-10-CM

## 2019-04-20 MED ORDER — BENZONATATE 100 MG PO CAPS: 100.0000 mg | ORAL_CAPSULE | Freq: Three times a day (TID) | ORAL | 0 refills | Status: DC | PRN

## 2019-04-20 NOTE — Progress Notes (Signed)
E-Visit for Corona Virus Screening   Your current symptoms could be consistent with the coronavirus. You should not be around anyone until you have been tested and those results re in. Many health care providers can now test patients at their office but not all are.  [place] has multiple testing sites. For information on our [place] testing locations and hours go to [email or web address]  We are enrolling you in our [place] for [place] . Daily you will receive a questionnaire within the [place] website. Our COVID 19 response team will be monitoring your responses daily.  Testing Information: The COVID-19 Community Testing sites will begin testing BY APPOINTMENT ONLY.  You can schedule online at [email or web address]  If you do not have access to a smart phone or computer you may call [phone] for an appointment.  Testing Locations: Appointment schedule is 8 am to 3:30 pm at all sites  [facility] indoors at [address], [place] [address] [facility]  indoors at [place]. [address] [place], [place] [address] [place] indoors at [address], [place] [address]  Additional testing sites in the Community:  . For [facility] Testing sites in [facility]  FaceUpdate.uy  . For Pop-up testing sites in [place]  BowlDirectory.co.uk  . For Testing sites with regular hours [email or web address]  . For [place] [email or web address]  . For [facility] [email or web address]  . For [facility] testing in [place] and [employer] [email or web address]  . For [facility]  testing in [facility]   [email or web address]  For  more information about community testing call [phone]   We are enrolling you in our [place] for [place] . Daily you will receive a questionnaire within the [place] website. Our COVID 19 response team will be monitoring your responses daily.  Please quarantine yourself while awaiting your test results. If you develop fever/cough/breathlessness, please stay home for 10 days with improving symptoms and until you have had 24 hours of no fever (without taking a fever reducer).  You should wear a mask or cloth face covering over your nose and mouth if you must be around other people or animals, including pets (even at home). Try to stay at least 6 feet away from other people. This will protect the people around you.  Please continue good preventive care measures, including:  frequent hand-washing, avoid touching your face, cover coughs/sneezes, stay out of crowds and keep a 6 foot distance from others.  COVID-19 is a respiratory illness with symptoms that are similar to the flu. Symptoms are typically mild to moderate, but there have been cases of severe illness and death due to the virus.   The following symptoms may appear 2-14 days after exposure: . Fever . Cough . Shortness of breath or difficulty breathing . Chills . Repeated shaking with chills . Muscle pain . Headache . Sore throat . New loss of taste or smell . Fatigue . Congestion or runny nose . Nausea or vomiting . Diarrhea  Go to the nearest hospital ED for assessment if fever/cough/breathlessness are severe or illness seems like a threat to life.  It is vitally important that if you feel that you have an infection such as this virus or any other virus that you stay home and away from places where you may spread it to others.  You should avoid contact with people age 28 and older.   You can use medication such as A prescription cough medication  called Tessalon Perles 100 mg. You may take 1-2 capsules every 8 hours as needed for cough  You may also take acetaminophen (Tylenol) as needed for fever.  Reduce your risk of any infection by using the same precautions used for avoiding the common cold or flu:  Mar[place] Kitchen Wash your hands often with soap and warm water for at least 20 seconds.  If soap and water are not readily available, use an alcohol-based hand sanitizer with at least 60% alcohol.  . If coughing or sneezing, cover your mouth and nose by coughing or sneezing into the elbow areas of your shirt or coat, into a tissue or into your sleeve (not your hands). . Avoid shaking hands with others and consider head nods or verbal greetings only. . Avoid touching your eyes, nose, or mouth with unwashed hands.  . Avoid close contact with people who are sick. . Avoid places or events with large numbers of people in one location, like concerts or sporting events. . Carefully consider travel plans you have or are making. . If you are planning any travel outside or inside the [place], visit the CDC's Travelers' Health webpage for the latest health notices. . If you have some symptoms but not all symptoms, continue to monitor at home and seek medical attention if your symptoms worsen. . If you are having a medical emergency, call 911.  HOME CARE . Only take medications as instructed by your medical team. . Drink plenty of fluids and get plenty of rest. . A steam or ultrasonic humidifier can help if you have congestion.   GET HELP RIGHT AWAY IF YOU HAVE EMERGENCY WARNING SIGNS** FOR COVID-19. If you or someone is showing any of these signs seek emergency medical care immediately. Call 911 or proceed to your closest emergency facility if: . You develop worsening high fever. . Trouble breathing . Bluish lips or face . Persistent pain or pressure in the chest . New confusion . Inability to wake or stay awake . You cough up blood. . Your symptoms become  more severe  **This list is not all possible symptoms. Contact your medical provider for any symptoms that are sever or concerning to you.  MAKE SURE YOU   Understand these instructions.  Will watch your condition.  Will get help right away if you are not doing well or get worse.  Your e-visit answers were reviewed by a board certified advanced clinical practitioner to complete your personal care plan.  Depending on the condition, your plan could have included both over the counter or prescription medications.  If there is a problem please reply once you have received a response from your provider.  Your safety is important to [place].  If you have drug allergies check your prescription carefully.    You can use MyChart to ask questions about today's visit, request a non-urgent call back, or ask for a work or school excuse for 24 hours related to this e-Visit. If it has been greater than 24 hours you will need to follow up with your provider, or enter a new e-Visit to address those concerns. You will get an e-mail in the next two days asking about your experience.  I hope that your e-visit has been valuable and will speed your recovery. Thank you for using e-visits.   5-10 minutes spent reviewing and documenting in chart.

## 2019-04-24 ENCOUNTER — Ambulatory Visit: Payer: PPO | Attending: Internal Medicine

## 2019-04-24 DIAGNOSIS — Z20828 Contact with and (suspected) exposure to other viral communicable diseases: Secondary | ICD-10-CM | POA: Diagnosis not present

## 2019-04-24 DIAGNOSIS — Z20822 Contact with and (suspected) exposure to covid-19: Secondary | ICD-10-CM

## 2019-04-26 LAB — NOVEL CORONAVIRUS, NAA: SARS-CoV-2, NAA: NOT DETECTED

## 2019-05-10 ENCOUNTER — Other Ambulatory Visit: Payer: Self-pay | Admitting: Internal Medicine

## 2019-05-10 ENCOUNTER — Ambulatory Visit (INDEPENDENT_AMBULATORY_CARE_PROVIDER_SITE_OTHER): Payer: PPO | Admitting: *Deleted

## 2019-05-10 ENCOUNTER — Other Ambulatory Visit: Payer: Self-pay

## 2019-05-10 VITALS — BP 114/64 | HR 56 | Temp 97.0°F | Wt 164.6 lb

## 2019-05-10 DIAGNOSIS — R300 Dysuria: Secondary | ICD-10-CM | POA: Diagnosis not present

## 2019-05-10 MED ORDER — CIPROFLOXACIN HCL 250 MG PO TABS: ORAL_TABLET | ORAL | 0 refills | Status: DC

## 2019-05-10 NOTE — Addendum Note (Signed)
Addended by: Eulis Canner on: 05/10/2019 03:53 PM   Modules accepted: Orders

## 2019-05-10 NOTE — Progress Notes (Signed)
Patient is here for a NV to check a UA and culture. Patient complained of painful urination, urgency and frequency x 3 days.  She complained of nausea, relieved by Zofran.

## 2019-05-12 LAB — URINALYSIS, ROUTINE W REFLEX MICROSCOPIC
Bacteria, UA: NONE SEEN /HPF
Bilirubin Urine: NEGATIVE
Glucose, UA: NEGATIVE
Hgb urine dipstick: NEGATIVE
Hyaline Cast: NONE SEEN /LPF
Ketones, ur: NEGATIVE
Nitrite: NEGATIVE
Protein, ur: NEGATIVE
RBC / HPF: NONE SEEN /HPF (ref 0–2)
Specific Gravity, Urine: 1.009 (ref 1.001–1.03)
Squamous Epithelial / HPF: NONE SEEN /HPF (ref <=5)
pH: 6 (ref 5.0–8.0)

## 2019-05-12 LAB — URINE CULTURE
MICRO NUMBER:: 10038683
Result:: NO GROWTH
SPECIMEN QUALITY:: ADEQUATE

## 2019-05-15 ENCOUNTER — Other Ambulatory Visit: Payer: Self-pay | Admitting: Internal Medicine

## 2019-05-15 MED FILL — DICYCLOMINE 20 MG TABLET: 20 | 90 days supply | Qty: 270 | Fill #1

## 2019-05-15 MED FILL — PANTOPRAZOLE SOD DR 40 MG T: 40 | 30 days supply | Qty: 30 | Fill #1

## 2019-05-15 MED FILL — SERTRALINE HCL 100 MG TAB: 100 | 90 days supply | Qty: 90 | Fill #2

## 2019-05-16 ENCOUNTER — Other Ambulatory Visit: Payer: Self-pay | Admitting: Internal Medicine

## 2019-05-16 DIAGNOSIS — K589 Irritable bowel syndrome without diarrhea: Secondary | ICD-10-CM

## 2019-05-25 ENCOUNTER — Encounter: Payer: Self-pay | Admitting: Internal Medicine

## 2019-05-25 ENCOUNTER — Ambulatory Visit (INDEPENDENT_AMBULATORY_CARE_PROVIDER_SITE_OTHER): Payer: PPO | Admitting: Internal Medicine

## 2019-05-25 ENCOUNTER — Other Ambulatory Visit: Payer: Self-pay

## 2019-05-25 VITALS — BP 108/72 | HR 64 | Temp 97.2°F | Resp 16 | Ht 62.5 in | Wt 158.2 lb

## 2019-05-25 DIAGNOSIS — N1831 Chronic kidney disease, stage 3a: Secondary | ICD-10-CM

## 2019-05-25 DIAGNOSIS — K219 Gastro-esophageal reflux disease without esophagitis: Secondary | ICD-10-CM | POA: Diagnosis not present

## 2019-05-25 DIAGNOSIS — R300 Dysuria: Secondary | ICD-10-CM | POA: Diagnosis not present

## 2019-05-25 DIAGNOSIS — E559 Vitamin D deficiency, unspecified: Secondary | ICD-10-CM | POA: Diagnosis not present

## 2019-05-25 DIAGNOSIS — Z79899 Other long term (current) drug therapy: Secondary | ICD-10-CM | POA: Diagnosis not present

## 2019-05-25 DIAGNOSIS — Z1211 Encounter for screening for malignant neoplasm of colon: Secondary | ICD-10-CM

## 2019-05-25 DIAGNOSIS — I10 Essential (primary) hypertension: Secondary | ICD-10-CM

## 2019-05-25 DIAGNOSIS — Z136 Encounter for screening for cardiovascular disorders: Secondary | ICD-10-CM | POA: Diagnosis not present

## 2019-05-25 DIAGNOSIS — Z0000 Encounter for general adult medical examination without abnormal findings: Secondary | ICD-10-CM | POA: Diagnosis not present

## 2019-05-25 DIAGNOSIS — E782 Mixed hyperlipidemia: Secondary | ICD-10-CM

## 2019-05-25 DIAGNOSIS — Z0001 Encounter for general adult medical examination with abnormal findings: Secondary | ICD-10-CM

## 2019-05-25 DIAGNOSIS — R7309 Other abnormal glucose: Secondary | ICD-10-CM | POA: Diagnosis not present

## 2019-05-25 DIAGNOSIS — E039 Hypothyroidism, unspecified: Secondary | ICD-10-CM

## 2019-05-25 DIAGNOSIS — K580 Irritable bowel syndrome with diarrhea: Secondary | ICD-10-CM

## 2019-05-25 DIAGNOSIS — Z87891 Personal history of nicotine dependence: Secondary | ICD-10-CM | POA: Diagnosis not present

## 2019-05-25 DIAGNOSIS — I700 Atherosclerosis of aorta: Secondary | ICD-10-CM

## 2019-05-25 NOTE — Patient Instructions (Signed)
- Link to sign up at [place] Web site for the Covid[place]19 vaccine   [email or web address]   Or call [phone]  ++++++++++++++++++++++++++++++++  Vit D  & Vit C 1,000 mg   are recommended to help protect  against the Covid-19 and other Corona viruses.    Also it's recommended  to take  Zinc 50 mg  to help  protect against the Covid-19   and best place to get  is also on Amazon.com  and don't pay more than 6-8 cents /pill !  ================================ Coronavirus (COVID-19) Are you at risk?  Are you at risk for the Coronavirus (COVID-19)?  To be considered HIGH RISK for Coronavirus (COVID-19), you have to meet the following criteria:  . Traveled to [place], [place], [place], [place] or [place]; or in the [place] to [place], [place], [place]  . or [place]; and have fever, cough, and shortness of breath within the last 2 weeks of travel OR . Been in close contact with a person diagnosed with COVID-19 within the last 2 weeks and have  . fever, cough,and shortness of breath .  . IF YOU DO NOT MEET THESE CRITERIA, YOU ARE CONSIDERED LOW RISK FOR COVID-19.  What to do if you are HIGH RISK for COVID-19?  . If you are having a medical emergency, call 911. . Seek medical care right away. Before you go to a doctor's office, urgent care or emergency department, .  call ahead and tell them about your recent travel, contact with someone diagnosed with COVID-19  .  and your symptoms.  . You should receive instructions from your physician's office regarding next steps of care.  . When you arrive at healthcare provider, tell the healthcare staff immediately you have returned from  . visiting [place], [place], [place], [place] or [place]; or traveled in the [place] to [place], [place],  . [place] or [place] in the last two  weeks or you have been in close contact with a person diagnosed with  . COVID-19 in the last 2 weeks.   . Tell the health care staff about your symptoms: fever, cough and shortness of breath. . After you have been seen by a medical provider, you will be either: o Tested for (COVID-19) and discharged home on quarantine except to seek medical care if  o symptoms worsen, and asked to  - Stay home and avoid contact with others until you get your results (4-5 days)  - Avoid travel on public transportation if possible (such as bus, train, or airplane) or o Sent to the Emergency Department by EMS for evaluation, COVID-19 testing  and  o possible admission depending on your condition and test results.  What to do if you are LOW RISK for COVID-19?  Reduce your risk of any infection by using the same precautions used for avoiding the common cold or flu:  . Wash your hands often with soap and warm water for at least 20 seconds.  If soap and water are not readily available,  . use an alcohol-based hand sanitizer with at least 60% alcohol.  . If coughing or sneezing, cover your mouth and nose by coughing or sneezing into the elbow areas of your shirt or coat, .  into a tissue or into your sleeve (not your hands). . Avoid shaking hands with others and consider head nods or verbal greetings only. . Avoid touching your eyes, nose, or mouth with unwashed hands.  .   Avoid close contact with people who are sick. . Avoid places or events with large numbers of people in one location, like concerts or sporting events. . Carefully consider travel plans you have or are making. . If you are planning any travel outside or inside the [place], visit the CDC's Travelers' Health webpage for the latest health notices. . If you have some symptoms but not all symptoms, continue to monitor at home and seek medical attention  . if your symptoms worsen. . If you are having a medical emergency, call  911.   . >>>>>>>>>>>>>>>>>>>>>>>>>>>>>>>>> . We Do NOT Approve of  [facility], [employer]  Our Patients  To Do Home Visits & We Do NOT Approve of [employer] > > > > > > > > > > > > > > > > > > > > > > > > > > > > > > > > > > > > > > >  Preventive Care for Adults  A healthy lifestyle and preventive care can promote health and wellness. Preventive health guidelines for women include the following key practices.  A routine yearly physical is a good way to check with your health care provider about your health and preventive screening. It is a chance to share any concerns and updates on your health and to receive a thorough exam.  Visit your dentist for a routine exam and preventive care every 6 months. Brush your teeth twice a day and floss once a day. Good oral hygiene prevents tooth decay and gum disease.  The frequency of eye exams is based on your age, health, family medical history, use of contact lenses, and other factors. Follow your health care provider's recommendations for frequency of eye exams.  Eat a healthy diet. Foods like vegetables, fruits, whole grains, low-fat dairy products, and lean protein foods contain the nutrients you need without too many calories. Decrease your intake of foods high in solid fats, added sugars, and salt. Eat the right amount of calories for you. Get information about a proper diet from your health care provider, if necessary.  Regular physical exercise is one of the most important things you can do for your health. Most adults should get at least 150 minutes of moderate-intensity exercise (any activity that increases your heart rate and causes you to sweat) each week. In addition, most adults need muscle-strengthening exercises on 2 or more days a week.  Maintain a healthy weight. The body mass index (BMI) is a screening tool to identify possible weight problems. It provides an estimate of body fat based on height and  weight. Your health care provider can find your BMI and can help you achieve or maintain a healthy weight. For adults 20 years and older:  A BMI below 18.5 is considered underweight.  A BMI of 18.5 to 24.9 is normal.  A BMI of 25 to 29.9 is considered overweight.  A BMI of 30 and above is considered obese.  Maintain normal blood lipids and cholesterol levels by exercising and minimizing your intake of saturated fat. Eat a balanced diet with plenty of fruit and vegetables. If your lipid or cholesterol levels are high, you are over 50, or you are at high risk for heart disease, you may need your cholesterol levels checked more frequently. Ongoing high lipid and cholesterol levels should be treated with medicines if diet and exercise are not working.  If you smoke, find out from your health care provider how to quit. If you  do not use tobacco, do not start.  Lung cancer screening is recommended for adults aged 55-80 years who are at high risk for developing lung cancer because of a history of smoking. A yearly low-dose CT scan of the lungs is recommended for people who have at least a 30-pack-year history of smoking and are a current smoker or have quit within the past 15 years. A pack year of smoking is smoking an average of 1 pack of cigarettes a day for 1 year (for example: 1 pack a day for 30 years or 2 packs a day for 15 years). Yearly screening should continue until the smoker has stopped smoking for at least 15 years. Yearly screening should be stopped for people who develop a health problem that would prevent them from having lung cancer treatment.  Avoid use of street drugs. Do not share needles with anyone. Ask for help if you need support or instructions about stopping the use of drugs.  High blood pressure causes heart disease and increases the risk of stroke.  Ongoing high blood pressure should be treated with medicines if weight loss and exercise do not work.  If you are 55-79 years  old, ask your health care provider if you should take aspirin to prevent strokes.  Diabetes screening involves taking a blood sample to check your fasting blood sugar level. This should be done once every 3 years, after age 45, if you are within normal weight and without risk factors for diabetes. Testing should be considered at a younger age or be carried out more frequently if you are overweight and have at least 1 risk factor for diabetes.  Breast cancer screening is essential preventive care for women. You should practice "breast self-awareness." This means understanding the normal appearance and feel of your breasts and may include breast self-examination. Any changes detected, no matter how small, should be reported to a health care provider. Women in their 20s and 30s should have a clinical breast exam (CBE) by a health care provider as part of a regular health exam every 1 to 3 years. After age 40, women should have a CBE every year. Starting at age 40, women should consider having a mammogram (breast X-ray test) every year. Women who have a family history of breast cancer should talk to their health care provider about genetic screening. Women at a high risk of breast cancer should talk to their health care providers about having an MRI and a mammogram every year.  Breast cancer gene (BRCA)-related cancer risk assessment is recommended for women who have family members with BRCA-related cancers. BRCA-related cancers include breast, ovarian, tubal, and peritoneal cancers. Having family members with these cancers may be associated with an increased risk for harmful changes (mutations) in the breast cancer genes BRCA1 and BRCA2. Results of the assessment will determine the need for genetic counseling and BRCA1 and BRCA2 testing.  Routine pelvic exams to screen for cancer are no longer recommended for nonpregnant women who are considered low risk for cancer of the pelvic organs (ovaries, uterus, and  vagina) and who do not have symptoms. Ask your health care provider if a screening pelvic exam is right for you.  If you have had past treatment for cervical cancer or a condition that could lead to cancer, you need Pap tests and screening for cancer for at least 20 years after your treatment. If Pap tests have been discontinued, your risk factors (such as having a new sexual partner) need to be   reassessed to determine if screening should be resumed. Some women have medical problems that increase the chance of getting cervical cancer. In these cases, your health care provider may recommend more frequent screening and Pap tests.    Colorectal cancer can be detected and often prevented. Most routine colorectal cancer screening begins at the age of 50 years and continues through age 75 years. However, your health care provider may recommend screening at an earlier age if you have risk factors for colon cancer. On a yearly basis, your health care provider may provide home test kits to check for hidden blood in the stool. Use of a small camera at the end of a tube, to directly examine the colon (sigmoidoscopy or colonoscopy), can detect the earliest forms of colorectal cancer. Talk to your health care provider about this at age 50, when routine screening begins.  Direct exam of the colon should be repeated every 5-10 years through age 75 years, unless early forms of pre-cancerous polyps or small growths are found.  Osteoporosis is a disease in which the bones lose minerals and strength with aging. This can result in serious bone fractures or breaks. The risk of osteoporosis can be identified using a bone density scan. Women ages 65 years and over and women at risk for fractures or osteoporosis should discuss screening with their health care providers. Ask your health care provider whether you should take a calcium supplement or vitamin D to reduce the rate of osteoporosis.  Menopause can be associated with  physical symptoms and risks. Hormone replacement therapy is available to decrease symptoms and risks. You should talk to your health care provider about whether hormone replacement therapy is right for you.  Use sunscreen. Apply sunscreen liberally and repeatedly throughout the day. You should seek shade when your shadow is shorter than you. Protect yourself by wearing long sleeves, pants, a wide-brimmed hat, and sunglasses year round, whenever you are outdoors.  Once a month, do a whole body skin exam, using a mirror to look at the skin on your back. Tell your health care provider of new moles, moles that have irregular borders, moles that are larger than a pencil eraser, or moles that have changed in shape or color.  Stay current with required vaccines (immunizations).  Influenza vaccine. All adults should be immunized every year.  Tetanus, diphtheria, and acellular pertussis (Td, Tdap) vaccine. Pregnant women should receive 1 dose of Tdap vaccine during each pregnancy. The dose should be obtained regardless of the length of time since the last dose. Immunization is preferred during the 27th-36th week of gestation. An adult who has not previously received Tdap or who does not know her vaccine status should receive 1 dose of Tdap. This initial dose should be followed by tetanus and diphtheria toxoids (Td) booster doses every 10 years. Adults with an unknown or incomplete history of completing a 3-dose immunization series with Td-containing vaccines should begin or complete a primary immunization series including a Tdap dose. Adults should receive a Td booster every 10 years.    Zoster vaccine. One dose is recommended for adults aged 60 years or older unless certain conditions are present.    Pneumococcal 13-valent conjugate (PCV13) vaccine. When indicated, a person who is uncertain of her immunization history and has no record of immunization should receive the PCV13 vaccine. An adult aged 19  years or older who has certain medical conditions and has not been previously immunized should receive 1 dose of PCV13 vaccine. This PCV13   should be followed with a dose of pneumococcal polysaccharide (PPSV23) vaccine. The PPSV23 vaccine dose should be obtained at least 1 or more year(s) after the dose of PCV13 vaccine. An adult aged 19 years or older who has certain medical conditions and previously received 1 or more doses of PPSV23 vaccine should receive 1 dose of PCV13. The PCV13 vaccine dose should be obtained 1 or more years after the last PPSV23 vaccine dose.    Pneumococcal polysaccharide (PPSV23) vaccine. When PCV13 is also indicated, PCV13 should be obtained first. All adults aged 65 years and older should be immunized. An adult younger than age 65 years who has certain medical conditions should be immunized. Any person who resides in a nursing home or long-term care facility should be immunized. An adult smoker should be immunized. People with an immunocompromised condition and certain other conditions should receive both PCV13 and PPSV23 vaccines. People with human immunodeficiency virus (HIV) infection should be immunized as soon as possible after diagnosis. Immunization during chemotherapy or radiation therapy should be avoided. Routine use of PPSV23 vaccine is not recommended for American Indians, [place] Natives, or people younger than 65 years unless there are medical conditions that require PPSV23 vaccine. When indicated, people who have unknown immunization and have no record of immunization should receive PPSV23 vaccine. One-time revaccination 5 years after the first dose of PPSV23 is recommended for people aged 19-64 years who have chronic kidney failure, nephrotic syndrome, asplenia, or immunocompromised conditions. People who received 1-2 doses of PPSV23 before age 65 years should receive another dose of PPSV23 vaccine at age 65 years or later if at least 5 years have passed since the  previous dose. Doses of PPSV23 are not needed for people immunized with PPSV23 at or after age 65 years.   Preventive Services / Frequency  Ages 65 years and over  Blood pressure check.  Lipid and cholesterol check.  Lung cancer screening. / Every year if you are aged 55-80 years and have a 30-pack-year history of smoking and currently smoke or have quit within the past 15 years. Yearly screening is stopped once you have quit smoking for at least 15 years or develop a health problem that would prevent you from having lung cancer treatment.  Clinical breast exam.** / Every year after age 40 years.   BRCA-related cancer risk assessment.** / For women who have family members with a BRCA-related cancer (breast, ovarian, tubal, or peritoneal cancers).  Mammogram.** / Every year beginning at age 40 years and continuing for as long as you are in good health. Consult with your health care provider.  Pap test.** / Every 3 years starting at age 30 years through age 65 or 70 years with 3 consecutive normal Pap tests. Testing can be stopped between 65 and 70 years with 3 consecutive normal Pap tests and no abnormal Pap or HPV tests in the past 10 years.  Fecal occult blood test (FOBT) of stool. / Every year beginning at age 50 years and continuing until age 75 years. You may not need to do this test if you get a colonoscopy every 10 years.  Flexible sigmoidoscopy or colonoscopy.** / Every 5 years for a flexible sigmoidoscopy or every 10 years for a colonoscopy beginning at age 50 years and continuing until age 75 years.  Hepatitis C blood test.** / For all people born from 1945 through 1965 and any individual with known risks for hepatitis C.  Osteoporosis screening.** / A one-time screening for women ages 65   years and over and women at risk for fractures or osteoporosis.  Skin self-exam. / Monthly.  Influenza vaccine. / Every year.  Tetanus, diphtheria, and acellular pertussis (Tdap/Td)  vaccine.** / 1 dose of Td every 10 years.  Zoster vaccine.** / 1 dose for adults aged 60 years or older.  Pneumococcal 13-valent conjugate (PCV13) vaccine.** / Consult your health care provider.  Pneumococcal polysaccharide (PPSV23) vaccine.** / 1 dose for all adults aged 65 years and older. Screening for abdominal aortic aneurysm (AAA)  by ultrasound is recommended for people who have history of high blood pressure or who are current or former smokers. ++++++++++++++++++++ Recommend Adult Low Dose Aspirin or  coated  Aspirin 81 mg daily  To reduce risk of Colon Cancer 40 %,  Skin Cancer 26 % ,  Melanoma 46%  and  Pancreatic cancer 60% ++++++++++++++++++++ Vitamin D goal  is between 70-100.  Please make sure that you are taking your Vitamin D as directed.  It is very important as a natural anti-inflammatory  helping hair, skin, and nails, as well as reducing stroke and heart attack risk.  It helps your bones and helps with mood. It also decreases numerous cancer risks so please take it as directed.  Low Vit D is associated with a 200-300% higher risk for CANCER  and 200-300% higher risk for HEART   ATTACK  &  STROKE.   ...................................... It is also associated with higher death rate at younger ages,  autoimmune diseases like Rheumatoid arthritis, Lupus, Multiple Sclerosis.    Also many other serious conditions, like depression, Alzheimer's Dementia, infertility, muscle aches, fatigue, fibromyalgia - just to name a few. ++++++++++++++++++ Recommend the book "The END of DIETING" by Dr Joel Fuhrman  & the book "The END of DIABETES " by Dr Joel Fuhrman At [employer].com - get book & Audio CD's    Being diabetic has a  300% increased risk for heart attack, stroke, cancer, and alzheimer- type vascular dementia. It is very important that you work harder with diet by avoiding all foods that are white. Avoid white rice (brown & wild rice is OK), white potatoes (sweetpotatoes  in moderation is OK), White bread or wheat bread or anything made out of white flour like bagels, donuts, rolls, buns, biscuits, cakes, pastries, cookies, pizza crust, and pasta (made from white flour & egg whites) - vegetarian pasta or spinach or wheat pasta is OK. Multigrain breads like Arnold's or Pepperidge [employer], or multigrain sandwich thins or flatbreads.  Diet, exercise and weight loss can reverse and cure diabetes in the early stages.  Diet, exercise and weight loss is very important in the control and prevention of complications of diabetes which affects every system in your body, ie. Brain - dementia/stroke, eyes - glaucoma/blindness, heart - heart attack/heart failure, kidneys - dialysis, stomach - gastric paralysis, intestines - malabsorption, nerves - severe painful neuritis, circulation - gangrene & loss of a leg(s), and finally cancer and Alzheimers.    I recommend avoid fried & greasy foods,  sweets/candy, white rice (brown or wild rice or Quinoa is OK), white potatoes (sweet potatoes are OK) - anything made from white flour - bagels, doughnuts, rolls, buns, biscuits,white and wheat breads, pizza crust and traditional pasta made of white flour & egg white(vegetarian pasta or spinach or wheat pasta is OK).  Multi-grain bread is OK - like multi-grain flat bread or sandwich thins. Avoid alcohol in excess. Exercise is also important.    Eat all the vegetables you want -   avoid meat, especially red meat and dairy - especially cheese.  Cheese is the most concentrated form of trans-fats which is the worst thing to clog up our arteries. Veggie cheese is OK which can be found in the fresh produce section at [employer] or [employer] or [employer]  +++++++++++++++++++ DASH Eating Plan  DASH stands for "Dietary Approaches to Stop Hypertension."   The DASH eating plan is a healthy eating plan that has been shown to reduce high blood pressure (hypertension). Additional health benefits may include  reducing the risk of type 2 diabetes mellitus, heart disease, and stroke. The DASH eating plan may also help with weight loss. WHAT DO I NEED TO KNOW ABOUT THE DASH EATING PLAN? For the DASH eating plan, you will follow these general guidelines:  Choose foods with a percent daily value for sodium of less than 5% (as listed on the food label).  Use salt-free seasonings or herbs instead of table salt or sea salt.  Check with your health care provider or pharmacist before using salt substitutes.  Eat lower-sodium products, often labeled as "lower sodium" or "no salt added."  Eat fresh foods.  Eat more vegetables, fruits, and low-fat dairy products.  Choose whole grains. Look for the word "whole" as the first word in the ingredient list.  Choose fish   Limit sweets, desserts, sugars, and sugary drinks.  Choose heart-healthy fats.  Eat veggie cheese   Eat more home-cooked food and less restaurant, buffet, and fast food.  Limit fried foods.  Cook foods using methods other than frying.  Limit canned vegetables. If you do use them, rinse them well to decrease the sodium.  When eating at a restaurant, ask that your food be prepared with less salt, or no salt if possible.                      WHAT FOODS CAN I EAT? Read Dr Fara Olden Fuhrman's books on The End of Dieting & The End of Diabetes  Grains Whole grain or whole wheat bread. Brown rice. Whole grain or whole wheat pasta. Quinoa, bulgur, and whole grain cereals. Low-sodium cereals. Corn or whole wheat flour tortillas. Whole grain cornbread. Whole grain crackers. Low-sodium crackers.  Vegetables Fresh or frozen vegetables (raw, steamed, roasted, or grilled). Low-sodium or reduced-sodium tomato and vegetable juices. Low-sodium or reduced-sodium tomato sauce and paste. Low-sodium or reduced-sodium canned vegetables.   Fruits All fresh, canned (in natural juice), or frozen fruits.  Protein Products  All fish and seafood.  Dried  beans, peas, or lentils. Unsalted nuts and seeds. Unsalted canned beans.  Dairy Low-fat dairy products, such as skim or 1% milk, 2% or reduced-fat cheeses, low-fat ricotta or cottage cheese, or plain low-fat yogurt. Low-sodium or reduced-sodium cheeses.  Fats and Oils Tub margarines without trans fats. Light or reduced-fat mayonnaise and salad dressings (reduced sodium). Avocado. Safflower, olive, or canola oils. Natural peanut or almond butter.  Other Unsalted popcorn and pretzels. The items listed above may not be a complete list of recommended foods or beverages. Contact your dietitian for more options.  +++++++++++++++  WHAT FOODS ARE NOT RECOMMENDED? Grains/ White flour or wheat flour White bread. White pasta. White rice. Refined cornbread. Bagels and croissants. Crackers that contain trans fat.  Vegetables  Creamed or fried vegetables. Vegetables in a . Regular canned vegetables. Regular canned tomato sauce and paste. Regular tomato and vegetable juices.  Fruits Dried fruits. Canned fruit in light or heavy syrup. Fruit juice.  Meat and Other Protein Products Meat in general - RED meat & White meat.  Fatty cuts of meat. Ribs, chicken wings, all processed meats as bacon, sausage, bologna, salami, fatback, hot dogs, bratwurst and packaged luncheon meats.  Dairy Whole or 2% milk, cream, half-and-half, and cream cheese. Whole-fat or sweetened yogurt. Full-fat cheeses or blue cheese. Non-dairy creamers and whipped toppings. Processed cheese, cheese spreads, or cheese curds.  Condiments Onion and garlic salt, seasoned salt, table salt, and sea salt. Canned and packaged gravies. Worcestershire sauce. Tartar sauce. Barbecue sauce. Teriyaki sauce. Soy sauce, including reduced sodium. Steak sauce. Fish sauce. Oyster sauce. Cocktail sauce. Horseradish. Ketchup and mustard. Meat flavorings and tenderizers. Bouillon cubes. Hot sauce. Tabasco sauce. Marinades. Taco seasonings.  Relishes.  Fats and Oils Butter, stick margarine, lard, shortening and bacon fat. Coconut, palm kernel, or palm oils. Regular salad dressings.  Pickles and olives. Salted popcorn and pretzels.  The items listed above may not be a complete list of foods and beverages to avoid.    

## 2019-05-25 NOTE — Progress Notes (Signed)
Annual Screening/Preventative Visit & Comprehensive Evaluation &  Examination     This very nice 69 y.o. DWF  presents for a Screening /Preventative Visit & comprehensive evaluation and management of multiple medical co-morbidities.  Patient has been followed for HTN, HLD, Hypothyroid,  Prediabetes  and Vitamin D Deficiency. 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       Patient also hx/o ADD- inattentive type with a good clinical response with Bupropion and prn Ritalin improving her ability to focus and concentrate. She also has hx/o Depression compensated with the Bupropion and also Sertraline.     In November 2020,  patient had removal if breast implants and had post-op complications post intubation neck & facial swelling with fever & drop in O2 to 79% and all attributed to secondary to cricopharyngeal inflammation      HTN predates since 2003. Patient's BP has been controlled at home and patient denies any cardiac symptoms as chest pain, palpitations, shortness of breath, dizziness or ankle swelling. Today's BP is at goal - 108/72.      Patient's hyperlipidemia is controlled with diet and medications. Patient denies myalgias or other medication SE's. Last lipids were not at goal:  Lab Results  Component Value Date   CHOL 187 05/25/2019   HDL 60 05/25/2019   LDLCALC 112 (H) 05/25/2019   TRIG 67 05/25/2019   CHOLHDL 3.1 05/25/2019       Patient has hx/o prediabetes predating (A1c 5.8% / 2011 & 6.0% / 2012)  and patient denies reactive hypoglycemic symptoms, visual blurring, diabetic polys or paresthesias. Last A1c was Normal & at goal: Lab Results  Component Value Date   HGBA1C 5.4 05/25/2019      Patient was dx'd Hypothyroid circa 1990's and initiated on thyroid replacement.     Finally, patient has history of Vitamin D Deficiency and last  Vitamin D was at goal:  Lab Results  Component Value Date   VD25OH 64 05/25/2019    Current Outpatient Medications on File Prior to Visit  Medication Sig  . ALPRAZolam (XANAX) 0.5 MG tablet TAKE 1/2 TO 1 TABLET BY MOUTH THREE TIMES A DAY IF NEEDED FOR ANXIETY OR SLEEP (Patient taking differently: Take 0.25-0.5 mg by mouth 3 (three) times daily as needed for anxiety or sleep. )  . aspirin EC 81 MG tablet Take 81 mg by mouth daily.  Marland Kitchen atenolol (TENORMIN) 100 MG tablet Take 1 tablet Daily for BP (Patient taking differently: Take 100 mg by mouth daily. )  . buPROPion (WELLBUTRIN XL) 150 MG 24 hr tablet Take 3 tablets (450 mg) every Morning for Mood & Depression  . Cholecalciferol (VITAMIN D-3) 5000 UNITS TABS Take 5,000 Units by mouth daily.   Marland Kitchen CRANBERRY PO Take 1 capsule by mouth daily.  Marland Kitchen dicyclomine (BENTYL) 20 MG tablet Take 1/2 to 1 tablet 3 x /day before Meals for ADD (Patient taking differently: Take 10-20 mg by mouth 3 (three) times daily before meals. )  . docusate sodium (COLACE) 100 MG capsule Take 100 mg by mouth 2 (two) times daily as needed for mild constipation.  . famotidine (PEPCID) 20 MG tablet Take 1 tablet (20 mg total) by mouth 2 (two) times daily.  Marland Kitchen levothyroxine (SYNTHROID, LEVOTHROID) 125 MCG tablet Take 1 tablet daily on an empty stomach with only water for 30  minutes & no Antacid meds for 4 hours (Patient taking differently: Take 125 mcg by mouth daily before breakfast. )  . Magnesium 250 MG TABS Take 250 mg by mouth daily.  . ondansetron (ZOFRAN) 8 MG tablet TAKE 1 TABLET BY MOUTH 3 TIMES A DAY AS NEEDED FOR NAUSEA (Patient taking differently: Take 8 mg by mouth 3 (three) times daily as needed for nausea. )  . OVER THE COUNTER MEDICATION OTC calcium gummy 2 daily.  Marland Kitchen OVER THE COUNTER MEDICATION OTC Vitamin C gummy 2 daily.  . pantoprazole (PROTONIX) 40 MG tablet Take 1 tablet (40 mg total) by mouth daily.  . sertraline (ZOLOFT) 100 MG tablet Take 1 tablet Daily for  Mood / Depression (Patient taking differently: Take 100 mg by mouth daily. )  . zinc gluconate 50 MG tablet Take 50 mg by mouth daily.   No current facility-administered medications on file prior to visit.   Allergies  Allergen Reactions  . Ace Inhibitors Other (See Comments)    Acute renal failure (lisinopril)  . Cephalexin Anaphylaxis  . Dilaudid [Hydromorphone Hcl] Anaphylaxis  . Robaxin [Methocarbamol] Anaphylaxis  . Macrobid [Nitrofurantoin] Other (See Comments)    Causes hyperthermia   . Codeine Nausea Only  . Latex Rash  . Sulfa Antibiotics Other (See Comments)    Do not take per nephrology   Past Medical History:  Diagnosis Date  . AKI (acute kidney injury) ([place]) 03/09/2019  . Chronic kidney disease (CKD), stage III (moderate)   . Diverticulosis   . Emphysema lung ([place])   . Helicobacter pylori (H. pylori)   . IBS (irritable bowel syndrome)   . LBBB (left bundle branch block)   . Lower urinary tract infection 06/21/2017  . Migraines   . OCD (obsessive compulsive disorder)   . Prediabetes    pcp  . Renal disorder    AKI due to lisinpril, his. pyelonephritis  . Sepsis ([place])   . Sleep apnea    no CPAP  . Vitamin D deficiency    Health Maintenance  Topic Date Due  . COLONOSCOPY  11/16/2017  . MAMMOGRAM  11/30/2020  . TETANUS/TDAP  04/28/2023  . INFLUENZA VACCINE  Completed  . DEXA SCAN  Completed  . Hepatitis C Screening  Completed  . PNA vac Low Risk Adult  Completed   Immunization History  Administered Date(s) Administered  . Influenza, High Dose Seasonal PF 03/22/2017, 02/16/2018, 03/22/2019  . Influenza-Unspecified 01/31/2016  . Pneumococcal Conjugate-13 03/22/2017  . Pneumococcal Polysaccharide-23 02/12/1999, 06/29/2018  . Td 04/27/2013  . Zoster 11/24/2005   Last Colon -  11/16/2012 - Dr Fuller Plan - recc 5 yr f/u - overdue Aug 2019  Last MGM - 12/01/2018  Past Surgical History:  Procedure Laterality Date  . AUGMENTATION MAMMAPLASTY    . BREAST  ENHANCEMENT SURGERY    . BREAST IMPLANT REMOVAL Bilateral 03/07/2019  . BUNIONECTOMY     Bil  . COLON SURGERY     colonoscopy  . ESOPHAGOGASTRODUODENOSCOPY (EGD) WITH PROPOFOL N/A 03/12/2019   Procedure: ESOPHAGOGASTRODUODENOSCOPY (EGD) WITH PROPOFOL;  Surgeon: Carol Ada, MD;  Location: [facility];  Service: Endoscopy;  Laterality: N/A;  . HEMORRHOID SURGERY    . OOPHORECTOMY    . ORIF ANKLE FRACTURE Left 08/30/2017   Procedure: OPEN REDUCTION INTERNAL FIXATION (ORIF) ANKLE FRACTURE;  Surgeon: Rod Can, MD;  Location: [facility];  Service: Orthopedics;  Laterality: Left;  . TONSILLECTOMY     Family History  Problem Relation Age of Onset  . Colon polyps  Mother   . Colon cancer Neg Hx    Social History   Tobacco Use  . Smoking status: Former Smoker    Packs/day: 1.00    Years: 40.00    Pack years: 40.00    Types: Cigarettes    Quit date: 04/27/2002    Years since quitting: 17.0  . Smokeless tobacco: Never Used  Substance Use Topics  . Alcohol use: No  . Drug use: No    ROS Constitutional: Denies fever, chills, weight loss/gain, headaches, insomnia,  night sweats, and change in appetite. Does c/o fatigue. Eyes: Denies redness, blurred vision, diplopia, discharge, itchy, watery eyes.  ENT: Denies discharge, congestion, post nasal drip, epistaxis, sore throat, earache, hearing loss, dental pain, Tinnitus, Vertigo, Sinus pain, snoring.  Cardio: Denies chest pain, palpitations, irregular heartbeat, syncope, dyspnea, diaphoresis, orthopnea, PND, claudication, edema Respiratory: denies cough, dyspnea, DOE, pleurisy, hoarseness, laryngitis, wheezing.  Gastrointestinal: Denies dysphagia, heartburn, reflux, water brash, pain, cramps, nausea, vomiting, bloating, diarrhea, constipation, hematemesis, melena, hematochezia, jaundice, hemorrhoids Genitourinary: Denies dysuria, frequency, urgency, nocturia, hesitancy, discharge, hematuria, flank pain Breast: Breast lumps, nipple discharge,  bleeding.  Musculoskeletal: Denies arthralgia, myalgia, stiffness, Jt. Swelling, pain, limp, and strain/sprain. Denies falls. Skin: Denies puritis, rash, hives, warts, acne, eczema, changing in skin lesion Neuro: No weakness, tremor, incoordination, spasms, paresthesia, pain Psychiatric: Denies confusion, memory loss, sensory loss. Denies Depression. Endocrine: Denies change in weight, skin, hair change, nocturia, and paresthesia, diabetic polys, visual blurring, hyper / hypo glycemic episodes.  Heme/Lymph: No excessive bleeding, bruising, enlarged lymph nodes.  Physical Exam  BP 108/72   Pulse 64   Temp (!) 97.2 F (36.2 C)   Resp 16   Ht 5' 2.5" (1.588 m)   Wt 158 lb 3.2 oz (71.8 kg)   BMI 28.47 kg/m   General Appearance: Well nourished, well groomed and in no apparent distress.  Eyes: PERRLA, EOMs, conjunctiva no swelling or erythema, normal fundi and vessels. Sinuses: No frontal/maxillary tenderness ENT/Mouth: EACs patent / TMs  nl. Nares clear without erythema, swelling, mucoid exudates. Oral hygiene is good. No erythema, swelling, or exudate. Tongue normal, non-obstructing. Tonsils not swollen or erythematous. Hearing normal.  Neck: Supple, thyroid not palpable. No bruits, nodes or JVD. Respiratory: Respiratory effort normal.  BS equal and clear bilateral without rales, rhonci, wheezing or stridor. Cardio: Heart sounds are normal with regular rate and rhythm and no murmurs, rubs or gallops. Peripheral pulses are normal and equal bilaterally without edema. No aortic or femoral bruits. Chest: symmetric with normal excursions and percussion. Breasts: Symmetric, without lumps, nipple discharge, retractions, or fibrocystic changes.  Abdomen: Flat, soft with bowel sounds active. Nontender, no guarding, rebound, hernias, masses, or organomegaly.  Lymphatics: Non tender without lymphadenopathy.  Genitourinary:  Musculoskeletal: Full ROM all peripheral extremities, joint stability, 5/5  strength, and normal gait. Skin: Warm and dry without rashes, lesions, cyanosis, clubbing or  ecchymosis.  Neuro: Cranial nerves intact, reflexes equal bilaterally. Normal muscle tone, no cerebellar symptoms. Sensation intact.  Pysch: Alert and oriented X 3, normal affect, Insight and Judgment appropriate.   Assessment and Plan  1. Annual Preventative Screening Examination  2. Essential hypertension  - EKG 12-Lead - Urinalysis, Routine w reflex microscopic - CBC with Differential/Platelet - COMPLETE METABOLIC PANEL WITH GFR - Magnesium  3. Hyperlipidemia, mixed  - EKG 12-Lead - TSH  4. Abnormal glucose  - EKG 12-Lead - Insulin, random  5. Vitamin D deficiency  - VITAMIN D 25 Hydroxy   6. Gastroesophageal reflux disease  -  CBC with Differential/Platelet  7. Stage 3a chronic kidney disease  - Urinalysis, Routine w reflex microscopic - Microalbumin / creatinine urine ratio  8. Dysuria  - Urinalysis, Routine w reflex microscopic - Urine Culture  9. Screening for ischemic heart disease  - EKG 12-Lead  10. Former smoker  - EKG 12-Lead  11. Atherosclerosis of aorta (HCC)  - EKG 12-Lead  12. Medication management  - Urinalysis, Routine w reflex microscopic - Microalbumin / creatinine urine ratio - CBC with Differential/Platelet - COMPLETE METABOLIC PANEL WITH GFR - Magnesium - Lipid panel - TSH - Hemoglobin A1c - Insulin, random - VITAMIN D 25 Hydroxy  13. Screening for colorectal cancer  - POC Hemoccult Bld/Stl  14. Irritable bowel syndrome with diarrhea  - hyoscyamine (LEVBID) 0.375 MG 12 hr tablet; Take 1 tablet 2 x /day for IBS  Dispense: 180 tablet; Refill: 1 - hyoscyamine (LEVSIN SL) 0.125 MG SL tablet; Dissolve 1 tablet under tongue every 4 hours if Needed for Nausea, Bloating, Cramping or Diarrhea  Dispense: 60 tablet; Refill: 1  15. Hypothyroidism  - levothyroxine (SYNTHROID) 125 MCG tablet; Take 1 tablet daily on an empty stomach with  only water for 30 minutes & no Antacid meds, Calcium or Magnesium for 4 hours & avoid Biotin  Dispense: 90 tablet; Refill: 3           Patient was counseled in prudent diet to achieve/maintain BMI less than 25 for weight control, BP monitoring, regular exercise and medications. Discussed med's effects and SE's. Screening labs and tests as requested with regular follow-up as recommended. Over 40 minutes of exam, counseling, chart review and high complex critical decision making was performed.   Kirtland Bouchard, MD

## 2019-05-26 ENCOUNTER — Other Ambulatory Visit: Payer: Self-pay | Admitting: Internal Medicine

## 2019-05-26 DIAGNOSIS — K589 Irritable bowel syndrome without diarrhea: Secondary | ICD-10-CM

## 2019-05-26 LAB — CBC WITH DIFFERENTIAL/PLATELET
Absolute Monocytes: 342 cells/uL (ref 200–950)
Basophils Absolute: 61 cells/uL (ref 0–200)
Basophils Relative: 1.2 %
Eosinophils Absolute: 332 cells/uL (ref 15–500)
Eosinophils Relative: 6.5 %
HCT: 40.2 % (ref 35.0–45.0)
Hemoglobin: 13.6 g/dL (ref 11.7–15.5)
Lymphs Abs: 1693 cells/uL (ref 850–3900)
MCH: 31.4 pg (ref 27.0–33.0)
MCHC: 33.8 g/dL (ref 32.0–36.0)
MCV: 92.8 fL (ref 80.0–100.0)
MPV: 10.8 fL (ref 7.5–12.5)
Monocytes Relative: 6.7 %
Neutro Abs: 2672 cells/uL (ref 1500–7800)
Neutrophils Relative %: 52.4 %
Platelets: 271 10*3/uL (ref 140–400)
RBC: 4.33 10*6/uL (ref 3.80–5.10)
RDW: 12.6 % (ref 11.0–15.0)
Total Lymphocyte: 33.2 %
WBC: 5.1 10*3/uL (ref 3.8–10.8)

## 2019-05-26 LAB — LIPID PANEL
Cholesterol: 187 mg/dL (ref <200)
HDL: 60 mg/dL (ref >=50)
LDL Cholesterol (Calc): 112 mg/dL (calc) — ABNORMAL HIGH
Non-HDL Cholesterol (Calc): 127 mg/dL (calc) (ref <130)
Total CHOL/HDL Ratio: 3.1 (calc) (ref <5.0)
Triglycerides: 67 mg/dL (ref <150)

## 2019-05-26 LAB — MAGNESIUM: Magnesium: 2 mg/dL (ref 1.5–2.5)

## 2019-05-26 LAB — COMPLETE METABOLIC PANEL WITH GFR
AG Ratio: 1.6 (calc) (ref 1.0–2.5)
ALT: 14 U/L (ref 6–29)
AST: 16 U/L (ref 10–35)
Albumin: 4 g/dL (ref 3.6–5.1)
Alkaline phosphatase (APISO): 63 U/L (ref 37–153)
BUN/Creatinine Ratio: 22 (calc) (ref 6–22)
BUN: 24 mg/dL (ref 7–25)
CO2: 30 mmol/L (ref 20–32)
Calcium: 9.6 mg/dL (ref 8.6–10.4)
Chloride: 106 mmol/L (ref 98–110)
Creat: 1.08 mg/dL — ABNORMAL HIGH (ref 0.50–0.99)
GFR, Est African American: 62 mL/min/{1.73_m2} (ref >=60)
GFR, Est Non African American: 53 mL/min/{1.73_m2} — ABNORMAL LOW (ref >=60)
Globulin: 2.5 g/dL (calc) (ref 1.9–3.7)
Glucose, Bld: 88 mg/dL (ref 65–99)
Potassium: 4.7 mmol/L (ref 3.5–5.3)
Sodium: 141 mmol/L (ref 135–146)
Total Bilirubin: 0.3 mg/dL (ref 0.2–1.2)
Total Protein: 6.5 g/dL (ref 6.1–8.1)

## 2019-05-26 LAB — URINE CULTURE
MICRO NUMBER:: 10093098
SPECIMEN QUALITY:: ADEQUATE

## 2019-05-26 LAB — URINALYSIS, ROUTINE W REFLEX MICROSCOPIC
Bilirubin Urine: NEGATIVE
Glucose, UA: NEGATIVE
Hgb urine dipstick: NEGATIVE
Ketones, ur: NEGATIVE
Leukocytes,Ua: NEGATIVE
Nitrite: NEGATIVE
Protein, ur: NEGATIVE
Specific Gravity, Urine: 1.023 (ref 1.001–1.03)
pH: 5.5 (ref 5.0–8.0)

## 2019-05-26 LAB — TSH: TSH: 0.39 mIU/L — ABNORMAL LOW (ref 0.40–4.50)

## 2019-05-26 LAB — MICROALBUMIN / CREATININE URINE RATIO
Creatinine, Urine: 131 mg/dL (ref 20–275)
Microalb Creat Ratio: 5 mcg/mg creat (ref <30)
Microalb, Ur: 0.7 mg/dL

## 2019-05-26 LAB — HEMOGLOBIN A1C
Hgb A1c MFr Bld: 5.4 % of total Hgb (ref <5.7)
Mean Plasma Glucose: 108 (calc)
eAG (mmol/L): 6 (calc)

## 2019-05-26 LAB — VITAMIN D 25 HYDROXY (VIT D DEFICIENCY, FRACTURES): Vit D, 25-Hydroxy: 75 ng/mL (ref 30–100)

## 2019-05-26 LAB — INSULIN, RANDOM: Insulin: 2.2 u[IU]/mL

## 2019-05-26 MED ORDER — HYOSCYAMINE SULFATE 0.125 MG SL SUBL: SUBLINGUAL_TABLET | SUBLINGUAL | 1 refills | Status: DC

## 2019-05-26 MED ORDER — LEVOTHYROXINE SODIUM 125 MCG PO TABS: ORAL_TABLET | ORAL | 3 refills | Status: DC

## 2019-05-26 MED ORDER — HYOSCYAMINE SULFATE ER 0.375 MG PO TB12: ORAL_TABLET | ORAL | 1 refills | Status: DC

## 2019-05-26 MED FILL — OSCIMIN SL 0.125 MG TABLET: 0.125 | 10 days supply | Qty: 60 | Fill #0

## 2019-05-26 MED FILL — buPROPion HCL ER (XL) 150 M: 150 | 90 days supply | Qty: 270 | Fill #0

## 2019-05-26 MED FILL — LEVOTHYROXINE 125 MCG TABLE: 125 | 90 days supply | Qty: 90 | Fill #0

## 2019-05-27 ENCOUNTER — Encounter: Payer: Self-pay | Admitting: Internal Medicine

## 2019-06-09 MED FILL — ATENOLOL 100 MG TABLET: 100 | 90 days supply | Qty: 90 | Fill #3

## 2019-06-16 ENCOUNTER — Other Ambulatory Visit: Payer: Self-pay | Admitting: Internal Medicine

## 2019-06-16 MED FILL — FAMOTIDINE 20 MG TABLET: 20 | 30 days supply | Qty: 60 | Fill #0

## 2019-07-19 ENCOUNTER — Ambulatory Visit: Payer: Self-pay | Admitting: Adult Health

## 2019-07-24 DIAGNOSIS — N949 Unspecified condition associated with female genital organs and menstrual cycle: Secondary | ICD-10-CM | POA: Diagnosis not present

## 2019-07-24 DIAGNOSIS — F909 Attention-deficit hyperactivity disorder, unspecified type: Secondary | ICD-10-CM | POA: Insufficient documentation

## 2019-07-24 DIAGNOSIS — F32A Depression, unspecified: Secondary | ICD-10-CM | POA: Insufficient documentation

## 2019-07-24 DIAGNOSIS — N393 Stress incontinence (female) (male): Secondary | ICD-10-CM | POA: Diagnosis not present

## 2019-07-24 DIAGNOSIS — N952 Postmenopausal atrophic vaginitis: Secondary | ICD-10-CM | POA: Diagnosis not present

## 2019-07-24 DIAGNOSIS — N812 Incomplete uterovaginal prolapse: Secondary | ICD-10-CM | POA: Diagnosis not present

## 2019-07-24 DIAGNOSIS — N3946 Mixed incontinence: Secondary | ICD-10-CM | POA: Diagnosis not present

## 2019-07-24 DIAGNOSIS — R32 Unspecified urinary incontinence: Secondary | ICD-10-CM | POA: Diagnosis not present

## 2019-07-24 DIAGNOSIS — K589 Irritable bowel syndrome without diarrhea: Secondary | ICD-10-CM | POA: Diagnosis not present

## 2019-07-24 HISTORY — DX: Attention-deficit hyperactivity disorder, unspecified type: F90.9

## 2019-08-14 MED FILL — FAMOTIDINE 20 MG TABLET: 20 | 30 days supply | Qty: 60 | Fill #1

## 2019-08-14 MED FILL — SERTRALINE HCL 100 MG TAB: 100 | 90 days supply | Qty: 90 | Fill #3

## 2019-08-17 ENCOUNTER — Other Ambulatory Visit: Payer: Self-pay | Admitting: *Deleted

## 2019-08-17 DIAGNOSIS — Z1211 Encounter for screening for malignant neoplasm of colon: Secondary | ICD-10-CM | POA: Diagnosis not present

## 2019-08-17 DIAGNOSIS — Z1212 Encounter for screening for malignant neoplasm of rectum: Secondary | ICD-10-CM | POA: Diagnosis not present

## 2019-08-17 LAB — POC HEMOCCULT BLD/STL (HOME/3-CARD/SCREEN)
Card #2 Fecal Occult Blod, POC: NEGATIVE
Card #3 Fecal Occult Blood, POC: NEGATIVE
Fecal Occult Blood, POC: NEGATIVE

## 2019-08-21 ENCOUNTER — Encounter: Payer: Self-pay | Admitting: Adult Health

## 2019-08-21 NOTE — Progress Notes (Deleted)
MEDICARE ANNUAL WELLNESS VISIT AND FOLLOW UP  Assessment:   Diagnoses and all orders for this visit:  Encounter for Medicare annual wellness exam  Atherosclerosis of aorta ([place]) Per Ct 2020 Control blood pressure, cholesterol, glucose, increase exercise.   Centrilobular emphysema ([place]) Per CT 2020, denies sx, monitor   Essential hypertension At goal; continue medication Monitor blood pressure at home; call if consistently over 130/80 Continue DASH diet.   Reminder to go to the ER if any CP, SOB, nausea, dizziness, severe HA, changes vision/speech, left arm numbness and tingling and jaw pain.  LBBB (left bundle branch block) Continue to monitor- cardiology as indicated  Migraine without status migrainosus, not intractable, unspecified migraine type Reports improved recently;  No significant issues or concerns  Irritable bowel syndrome, unspecified type Symptoms stable/improved - continue monitoring diet/lifestyle modification  Hypothyroidism, unspecified type continue medications the same pending lab results reminded to take on an empty stomach 30-8mins before food.  -     TSH  CKD Stage 3 (GFR 55 ml/min) Increase fluids, avoid NSAIDS, monitor sugars, will monitor Continue follow up with nephrology -     CMP/GFR  Attention deficit hyperactivity disorder (ADHD), predominantly inattentive type Continue medications: ritalin PRN Helps with focus, no AE's. The patient was counseled on the addictive nature of the medication and was encouraged to take drug holidays when not needed. Advised to limit to 5 days a week or less, she is agreeable and optimistic that this is reasonable.   Depression, controlled Continue medications; discussed minimizing use of benzos to avoid tolerance Lifestyle discussed: diet/exerise, sleep hygiene, stress management, hydration  Diverticulosis of intestine without bleeding, unspecified intestinal tract location Due for colonoscopy  ***  Medication management -     CBC with Differential/Platelet -     CMP/GFR  Mixed hyperlipidemia Fair control by lifestyle modification only Continue low cholesterol diet and exercise.  -     Lipid panel  Obsessive-compulsive disorder, unspecified type Symptoms improved on SSRI  Other abnormal glucose Recent A1Cs at goal Discussed disease and risks Discussed diet/exercise, weight management  -     CMP/GFR  Vitamin D deficiency At goal at recent check; continue to recommend supplementation for goal of 70-100 Defer vitamin D level  Osteopenia - get dexa q2y, continue Vit D and Ca, weight bearing exercises  CHF, diastolic ([place]) Per ECHO while admitted 02/2019 No hx prior, denies notable sx ***  Hx of colon polyps ***   Over 40 minutes of exam, counseling, chart review and critical decision making was performed Future Appointments  Date Time Provider [place]  08/23/2019  9:00 AM Liane Comber, NP [facility] None  11/29/2019  9:30 AM Unk [name], MD [facility] None  05/24/2020 11:00 AM Unk [name], MD [facility] None     Plan:   During the course of the visit the patient was educated and counseled about appropriate screening and preventive services including:    Pneumococcal vaccine   Prevnar 13  Influenza vaccine  Td vaccine  Screening electrocardiogram  Bone densitometry screening  Colorectal cancer screening  Diabetes screening  Glaucoma screening  Nutrition counseling   Advanced directives: requested   Subjective:  [name] is a 69 y.o. female who presents for Medicare Annual Wellness Visit and 3 month follow up.   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  In November 2020,  patient had removal if breast implants and had post-op complications post intubation neck & facial  swelling with fever & drop in O2 to 79% and all attributed to secondary to cricopharyngeal inflammation. At this time she underwent Echo 03/10/2019 LVEF estimated 55-60%, Abnormal (paradoxical) septal motion, consistent with left bundle branch block (long history of this on EKG), Grade I diastolic dysfunction, she also had some elevated troponins felt to be demand related and was recommended for outpatient stress test, *** did she follow up with Dr. Johnsie Cancel ***  she has a diagnosis of depression/anxiety and is currently on wellbutrin, sertraline and xanax 0.5 mg TID PRN, reports symptoms are well controlled on current regimen. she currently reports using xanax Very rarely - she reports she hasn't used in months. She also has ADD and prescribed ritalin, takes 3-4 times per month.   BMI is There is no height or weight on file to calculate BMI., she has been working on diet and exercise. Wt Readings from Last 3 Encounters:  05/25/19 158 lb 3.2 oz (71.8 kg)  05/10/19 164 lb 9.6 oz (74.7 kg)  03/22/19 158 lb (71.7 kg)   She has aortic atherosclerosis and centrilobular emphysema per CT chest 02/2019 ***? Former smoker LBBB followed by Dr. Johnsie Cancel   Her blood pressure has been controlled at home, today their BP is   She does not workout but is doing lots of intense housework. She denies chest pain, shortness of breath, dizziness.   She is not on cholesterol medication, mild elevations treated by lifestyle modification. Her cholesterol is not at goal. The cholesterol last visit was:   Lab Results  Component Value Date   CHOL 187 05/25/2019   HDL 60 05/25/2019   LDLCALC 112 (H) 05/25/2019   TRIG 67 05/25/2019   CHOLHDL 3.1 05/25/2019    She has been working on diet and exercise for glucose management, and denies increased appetite, nausea, paresthesia of the feet, polydipsia, polyuria, visual disturbances and vomiting. Last A1C in the office was:  Lab Results  Component Value Date   HGBA1C 5.4  05/25/2019   She is on thyroid medication. Her medication was not changed last visit.  *** Lab Results  Component Value Date   TSH 0.39 (L) 05/25/2019   She is followed by nephrology Dr. Katrine Coho  Lab Results  Component Value Date   [id number] (L) 05/25/2019   Patient is on Vitamin D supplement and at goal at recent check:  Lab Results  Component Value Date   VD25OH 75 05/25/2019      Medication Review: Current Outpatient Medications on File Prior to Visit  Medication Sig Dispense Refill  . ALPRAZolam (XANAX) 0.5 MG tablet TAKE 1/2 TO 1 TABLET BY MOUTH THREE TIMES A DAY IF NEEDED FOR ANXIETY OR SLEEP (Patient taking differently: Take 0.25-0.5 mg by mouth 3 (three) times daily as needed for anxiety or sleep. ) 90 tablet 0  . aspirin EC 81 MG tablet Take 81 mg by mouth daily.    Marland Kitchen atenolol (TENORMIN) 100 MG tablet Take 1 tablet Daily for BP (Patient taking differently: Take 100 mg by mouth daily. ) 90 tablet 3  . buPROPion (WELLBUTRIN XL) 150 MG 24 hr tablet Take 3 tablets (450 mg) every Morning for Mood & Depression 270 tablet 1  . Cholecalciferol (VITAMIN D-3) 5000 UNITS TABS Take 5,000 Units by mouth daily.     Marland Kitchen CRANBERRY PO Take 1 capsule by mouth daily.    Marland Kitchen  docusate sodium (COLACE) 100 MG capsule Take 100 mg by mouth 2 (two) times daily as needed for mild constipation.    . famotidine (PEPCID) 20 MG tablet Take 1 tablet 2 x  /day for Indigestion & Heartburn 180 tablet 3  . hyoscyamine (LEVBID) 0.375 MG 12 hr tablet Take 1 tablet 2 x /day for IBS 180 tablet 1  . hyoscyamine (LEVSIN SL) 0.125 MG SL tablet Dissolve 1 tablet under tongue every 4 hours if Needed for Nausea, Bloating, Cramping or Diarrhea 60 tablet 1  . levothyroxine (SYNTHROID) 125 MCG tablet Take 1 tablet daily on an empty stomach with only water for 30 minutes & no Antacid meds, Calcium or Magnesium for 4 hours & avoid Biotin 90 tablet 3  . Magnesium 250 MG TABS Take 250 mg by mouth daily.    .  ondansetron (ZOFRAN) 8 MG tablet TAKE 1 TABLET BY MOUTH 3 TIMES A DAY AS NEEDED FOR NAUSEA (Patient taking differently: Take 8 mg by mouth 3 (three) times daily as needed for nausea. ) 60 tablet 2  . OVER THE COUNTER MEDICATION OTC calcium gummy 2 daily.    Marland Kitchen OVER THE COUNTER MEDICATION OTC Vitamin C gummy 2 daily.    . pantoprazole (PROTONIX) 40 MG tablet Take 1 tablet (40 mg total) by mouth daily. 30 tablet 1  . sertraline (ZOLOFT) 100 MG tablet Take 1 tablet Daily for Mood / Depression (Patient taking differently: Take 100 mg by mouth daily. ) 90 tablet 3  . zinc gluconate 50 MG tablet Take 50 mg by mouth daily.     No current facility-administered medications on file prior to visit.    Allergies  Allergen Reactions  . Ace Inhibitors Other (See Comments)    Acute renal failure (lisinopril)  . Cephalexin Anaphylaxis  . Dilaudid [Hydromorphone Hcl] Anaphylaxis  . Robaxin [Methocarbamol] Anaphylaxis  . Macrobid [Nitrofurantoin] Other (See Comments)    Causes hyperthermia   . Codeine Nausea Only  . Latex Rash  . Sulfa Antibiotics Other (See Comments)    Do not take per nephrology    Current Problems (verified) Patient Active Problem List   Diagnosis Date Noted  . Atherosclerosis of aorta ([place]) 03/22/2019  . Emphysema lung ([place])   . Dysphagia 03/11/2019  . CHF NYHA class II, acute, diastolic ([place]) 123XX123  . Odynophagia 03/10/2019  . Osteopenia 01/16/2019  . Colon polyps 06/28/2018  . CKD Stage 3 ([place]) 01/10/2015  . IBS  01/10/2015  . Hyperlipidemia, mixed 06/12/2014  . Medication management 06/12/2014  . LBBB (left bundle branch block) 07/24/2013  . HTN (hypertension) 07/24/2013  . Depression, controlled   . Diverticulosis   . ADHD (attention deficit hyperactivity disorder)   . OCD (obsessive compulsive disorder)   . Abnormal glucose   . Migraines   . Vitamin D deficiency   . Hypothyroidism 04/05/2013    Screening Tests Immunization History  Administered  Date(s) Administered  . Influenza, High Dose Seasonal PF 03/22/2017, 02/16/2018, 03/22/2019  . Influenza-Unspecified 01/31/2016  . Pneumococcal Conjugate-13 03/22/2017  . Pneumococcal Polysaccharide-23 02/12/1999, 06/29/2018  . Td 04/27/2013  . Zoster 11/24/2005    Preventative care: Last colonoscopy: 10/2012 - due f/u 10/2017 Dr. Fuller Plan, patient will schedule *** Last EGD: 02/2019 Last mammogram: 11/2018 Last breast MRI: 10/2018 Last pap smear/pelvic exam: 2018 *** DEXA: 12/2018, R fem T -2.1  Prior vaccinations: TD or Tdap: 2015  Influenza: 02/2019 Pneumococcal: 2000, 2020 Prevnar13:  2018 Shingles/Zostavax: 2007 Covid 19: ***  Names of Other Physician/Practitioners  you currently use: 1. [facility] here for primary care 2. Dr. Delman Cheadle, eye doctor, last visit 2020 3. Dr. Woodfin Ganja, dentist, last visit 2019   Patient Care Team: Unk [name], MD as PCP - General (Internal Medicine) Josue Hector, MD as PCP - Cardiology (Cardiology) Rockey Situ (Urology) Donato Heinz, MD as Consulting Physician (Nephrology) Melissa Noon, [place] as Referring Physician (Optometry) Ladene Artist, MD as Consulting Physician (Gastroenterology) Josue Hector, MD as Consulting Physician (Cardiology)  SURGICAL HISTORY She  has a past surgical history that includes Tonsillectomy; Oophorectomy; Bunionectomy; Hemorrhoid surgery; Breast enhancement surgery; Colon surgery; ORIF ankle fracture (Left, 08/30/2017); Augmentation mammaplasty; Breast implant removal (Bilateral, 03/07/2019); and Esophagogastroduodenoscopy (egd) with propofol (N/A, 03/12/2019). FAMILY HISTORY Her family history includes Colon polyps in her mother. SOCIAL HISTORY She  reports that she quit smoking about 17 years ago. Her smoking use included cigarettes. She has a 40.00 pack-year smoking history. She has never used smokeless tobacco. She reports that she does not drink alcohol or use  drugs.   MEDICARE WELLNESS OBJECTIVES: Physical activity:   Cardiac risk factors:   Depression/mood screen:   Depression screen [facility] 2/9 05/27/2019  Decreased Interest 0  Down, Depressed, Hopeless 0  PHQ - 2 Score 0    ADLs:  In your present state of health, do you have any difficulty performing the following activities: 05/27/2019 03/09/2019  Hearing? N N  Vision? N N  Difficulty concentrating or making decisions? N N  Walking or climbing stairs? N N  Dressing or bathing? N N  Doing errands, shopping? N N  Some recent data might be hidden     Cognitive Testing  Alert? Yes  Normal Appearance?Yes  Oriented to person? Yes  Place? Yes   Time? Yes  Recall of three objects?  Yes  Can perform simple calculations? Yes  Displays appropriate judgment?Yes  Can read the correct time from a watch face?Yes  EOL planning:    Review of Systems  Constitutional: Negative for malaise/fatigue and weight loss.  HENT: Negative for hearing loss and tinnitus.   Eyes: Negative for blurred vision and double vision.  Respiratory: Negative for cough, shortness of breath and wheezing.   Cardiovascular: Negative for chest pain, palpitations, orthopnea, claudication and leg swelling.  Gastrointestinal: Negative for abdominal pain, blood in stool, constipation, diarrhea, heartburn, melena, nausea and vomiting.  Genitourinary: Negative.   Musculoskeletal: Negative for falls, joint pain and myalgias.  Skin: Negative for rash.  Neurological: Negative for dizziness, tingling, sensory change, weakness and headaches.  Endo/Heme/Allergies: Negative for polydipsia.  Psychiatric/Behavioral: Negative for depression, memory loss, substance abuse and suicidal ideas. The patient is not nervous/anxious and does not have insomnia.   All other systems reviewed and are negative.    Objective:     There were no vitals filed for this visit. There is no height or weight on file to calculate BMI.  General  appearance: alert, no distress, WD/WN, female HEENT: normocephalic, sclerae anicteric, TMs pearly, nares patent, no discharge or erythema, pharynx normal Oral cavity: MMM, no lesions Neck: supple, no lymphadenopathy, no thyromegaly, no masses Heart: RRR, normal S1, S2, no murmurs Lungs: CTA bilaterally, no wheezes, rhonchi, or rales Abdomen: +bs, soft, non tender, non distended, no masses, no hepatomegaly, no splenomegaly Musculoskeletal: nontender, no swelling, no obvious deformity Extremities: no edema, no cyanosis, no clubbing - left toe injected with healing scabs Pulses: 2+ symmetric, upper and lower extremities, normal cap refill Neurological: alert, oriented x 3, CN2-12  intact, strength normal upper extremities and lower extremities, sensation normal throughout, DTRs 2+ throughout, no cerebellar signs, gait normal Psychiatric: normal affect, behavior normal, pleasant   Medicare Attestation I have personally reviewed: The patient's medical and social history Their use of alcohol, tobacco or illicit drugs Their current medications and supplements The patient's functional ability including ADLs,fall risks, home safety risks, cognitive, and hearing and visual impairment Diet and physical activities Evidence for depression or mood disorders  The patient's weight, height, BMI, and visual acuity have been recorded in the chart.  I have made referrals, counseling, and provided education to the patient based on review of the above and I have provided the patient with a written personalized care plan for preventive services.     Lauren Ribas, NP   08/21/2019

## 2019-08-23 ENCOUNTER — Ambulatory Visit: Payer: Self-pay | Admitting: Adult Health

## 2019-08-26 DIAGNOSIS — N390 Urinary tract infection, site not specified: Secondary | ICD-10-CM | POA: Diagnosis not present

## 2019-08-26 DIAGNOSIS — R110 Nausea: Secondary | ICD-10-CM | POA: Diagnosis not present

## 2019-08-26 DIAGNOSIS — R112 Nausea with vomiting, unspecified: Secondary | ICD-10-CM | POA: Diagnosis not present

## 2019-08-26 DIAGNOSIS — R319 Hematuria, unspecified: Secondary | ICD-10-CM | POA: Diagnosis not present

## 2019-08-28 MED FILL — buPROPion HCL ER (XL) 150 M: 150 | 90 days supply | Qty: 270 | Fill #1

## 2019-08-28 MED FILL — LEVOTHYROXINE 125 MCG TABLE: 125 | 90 days supply | Qty: 90 | Fill #1

## 2019-08-28 NOTE — Progress Notes (Signed)
MEDICARE ANNUAL WELLNESS VISIT AND FOLLOW UP  Assessment:   Diagnoses and all orders for this visit:  Encounter for Medicare annual wellness exam  Atherosclerosis of aorta ([place]) Per Ct 2020 Control blood pressure, cholesterol, glucose, increase exercise.   Centrilobular emphysema ([place]) Per CT 2020, denies sx, monitor   Essential hypertension Typically well controlled at home, some fatigue ? R/t BB, will try cutting atenolol to 50 mg, call if persistently above goal and will add HCTZ if needed  Monitor blood pressure at home; call if consistently over 130/80 Continue DASH diet.   Reminder to go to the ER if any CP, SOB, nausea, dizziness, severe HA, changes vision/speech, left arm numbness and tingling and jaw pain.  LBBB (left bundle branch block) Continue to monitor- cardiology Dr. Acie Fredrickson follows   Migraine without status migrainosus, not intractable, unspecified migraine type Reports improved recently;  No significant issues or concerns   Irritable bowel syndrome, unspecified type Symptoms stable/improved - continue monitoring diet/lifestyle modification  Hypothyroidism, unspecified type continue medications the same pending lab results reminded to take on an empty stomach 30-70mins before food.  -     TSH  CKD Stage 3 (GFR 55 ml/min) Increase fluids, avoid NSAIDS, monitor sugars, will monitor Continue follow up with nephrology -     CMP/GFR  Attention deficit hyperactivity disorder (ADHD), predominantly inattentive type Continue medications: ritalin PRN Helps with focus, no AE's. The patient was counseled on the addictive nature of the medication and was encouraged to take drug holidays when not needed. Advised to limit to 5 days a week or less, she is agreeable and optimistic that this is reasonable.   Major depression, controlled Continue medications; discussed minimizing use of benzos to avoid tolerance Lifestyle discussed: diet/exerise, sleep hygiene, stress  management, hydration  Obsessive-compulsive disorder, unspecified type Symptoms improved on SSRI  Diverticulosis of intestine without bleeding, unspecified intestinal tract location Increase fiber intake;   Medication management -     CBC with Differential/Platelet -     CMP/GFR  Mixed hyperlipidemia Fair control by lifestyle modification only Continue low cholesterol diet and exercise.  -     Lipid panel  Other abnormal glucose Recent A1Cs at goal Discussed disease and risks Discussed diet/exercise, weight management  -     CMP/GFR  Vitamin D deficiency At goal at recent check; continue to recommend supplementation for goal of 70-100 Defer vitamin D level  Osteopenia - get dexa q2y, next due 12/2020, continue Vit D and Ca, weight bearing exercises  CHF, diastolic ([place]) Per ECHO while admitted 02/2019 No hx prior, denies notable sx  Was recommended outpatient cardiology follow [facility], patient states will schedule follow up with Dr. Johnsie Cancel  Hx of colon polyps Referral placed to GI   Former smoker, 40 pack year, quit 2004 UTD on screening, CT chest 02/2019, do annual CXR  Over 40 minutes of exam, counseling, chart review and critical decision making was performed Future Appointments  Date Time Provider [place]  11/29/2019  9:30 AM Unk [name], MD [facility] None  05/24/2020 11:00 AM Unk [name], MD [facility] None     Plan:   During the course of the visit the patient was educated and counseled about appropriate screening and preventive services including:    Pneumococcal vaccine   Prevnar 13  Influenza vaccine  Td vaccine  Screening electrocardiogram  Bone densitometry screening  Colorectal cancer screening  Diabetes screening  Glaucoma screening  Nutrition counseling   Advanced directives: requested  Subjective:  [name] is a 69 y.o. female who presents for Medicare Annual Wellness Visit and 3 month follow  up.   She was seen at [facility] on 5/1 for UTI, was initiated on cipro and feeling much improved. Has follow up with established urologist in a few weeks, Dr. Olin Pia. Reports recurrent UTIs, is on topical estrogen but unsure what.   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   In November 2020,  patient had removal if breast implants and had post-op complications post intubation neck & facial swelling with fever & drop in O2 to 79% and all attributed to secondary to cricopharyngeal inflammation.  At this time she underwent Echo 03/10/2019 LVEF estimated 55-60%, Abnormal (paradoxical) septal motion, consistent with left bundle branch block (long history of this on EKG), Grade I diastolic dysfunction, she also had some elevated troponins felt to be demand related and was recommended for outpatient stress test, patient states will schedule follow up.   she has a diagnosis of depression/anxiety/OCD and is currently on wellbutrin, sertraline and xanax 0.5 mg TID PRN, reports symptoms are well controlled on current regimen. she currently reports using xanax Very rarely - she reports she hasn't used in months.   BMI is Body mass index is 28.62 kg/m., she has been working on diet, admits minimal exercise.  Wt Readings from Last 3 Encounters:  08/30/19 159 lb (72.1 kg)  05/25/19 158 lb 3.2 oz (71.8 kg)  05/10/19 164 lb 9.6 oz (74.7 kg)   She has aortic atherosclerosis and centrilobular emphysema per CT chest 02/2019, former smoker, quit in 2004, 40 pack year history.  LBBB followed by Dr. Johnsie Cancel   Her blood pressure has been controlled at home, today their BP is BP: 130/70 She does not workout but is doing lots of intense housework. She denies chest pain, shortness of breath, dizziness. Endorses some intermittent fatigue, on atenolol 100 mg with LBBB, last  EKG showed brady 46, wonders if this may be contributing.   She is not on cholesterol medication, mild elevations treated by lifestyle modification. Her cholesterol is not at goal. The cholesterol last visit was:   Lab Results  Component Value Date   CHOL 187 05/25/2019   HDL 60 05/25/2019   LDLCALC 112 (H) 05/25/2019   TRIG 67 05/25/2019   CHOLHDL 3.1 05/25/2019    She has been working on diet and exercise for glucose management, and denies increased appetite, nausea, paresthesia of the feet, polydipsia, polyuria, visual disturbances and vomiting. Last A1C in the office was:  Lab Results  Component Value Date   HGBA1C 5.4 05/25/2019   She is on thyroid medication. Her medication was not changed last visit, taking 125 mcg daily  Lab Results  Component Value Date   TSH 0.39 (L) 05/25/2019   She is followed by nephrology Dr. Katrine Coho  Lab Results  Component Value Date   [id number] (L) 05/25/2019   Patient is on Vitamin D supplement and at goal at recent check:  Lab Results  Component Value Date   VD25OH 75 05/25/2019      Medication Review: Current Outpatient Medications on File Prior to Visit  Medication Sig Dispense Refill  . ALPRAZolam (XANAX) 0.5 MG tablet TAKE 1/2 TO 1 TABLET BY MOUTH THREE TIMES A DAY IF NEEDED FOR ANXIETY OR  SLEEP (Patient taking differently: Take 0.25-0.5 mg by mouth 3 (three) times daily as needed for anxiety or sleep. ) 90 tablet 0  . ascorbic acid (VITAMIN C) 250 MG tablet Take by mouth.    Marland Kitchen aspirin EC 81 MG tablet Take 81 mg by mouth daily.    Marland Kitchen buPROPion (WELLBUTRIN XL) 150 MG 24 hr tablet Take 3 tablets (450 mg) every Morning for Mood & Depression 270 tablet 1  . Cholecalciferol (VITAMIN D-3) 5000 UNITS TABS Take 5,000 Units by mouth daily.     . ciprofloxacin (CIPRO) 500 MG tablet Take by mouth.    . CRANBERRY PO Take 1 capsule by mouth daily.    Marland Kitchen docusate sodium (COLACE) 100 MG capsule Take 100 mg by mouth 2 (two) times daily as  needed for mild constipation.    . famotidine (PEPCID) 20 MG tablet Take 1 tablet 2 x  /day for Indigestion & Heartburn 180 tablet 3  . hyoscyamine (LEVBID) 0.375 MG 12 hr tablet Take 1 tablet 2 x /day for IBS 180 tablet 1  . hyoscyamine (LEVSIN SL) 0.125 MG SL tablet Dissolve 1 tablet under tongue every 4 hours if Needed for Nausea, Bloating, Cramping or Diarrhea 60 tablet 1  . levothyroxine (SYNTHROID) 125 MCG tablet Take 1 tablet daily on an empty stomach with only water for 30 minutes & no Antacid meds, Calcium or Magnesium for 4 hours & avoid Biotin 90 tablet 3  . Magnesium 250 MG TABS Take 250 mg by mouth daily.    . ondansetron (ZOFRAN) 8 MG tablet TAKE 1 TABLET BY MOUTH 3 TIMES A DAY AS NEEDED FOR NAUSEA (Patient taking differently: Take 8 mg by mouth 3 (three) times daily as needed for nausea. ) 60 tablet 2  . OVER THE COUNTER MEDICATION OTC Vitamin C gummy 2 daily.    . pantoprazole (PROTONIX) 40 MG tablet Take 1 tablet (40 mg total) by mouth daily. 30 tablet 1  . sertraline (ZOLOFT) 100 MG tablet Take 1 tablet Daily for Mood / Depression (Patient taking differently: Take 100 mg by mouth daily. ) 90 tablet 3  . zinc gluconate 50 MG tablet Take 50 mg by mouth daily.     No current facility-administered medications on file prior to visit.    Allergies  Allergen Reactions  . Ace Inhibitors Other (See Comments)    Acute renal failure (lisinopril)  . Cephalexin Anaphylaxis  . Dilaudid [Hydromorphone Hcl] Anaphylaxis  . Robaxin [Methocarbamol] Anaphylaxis  . Macrobid [Nitrofurantoin] Other (See Comments)    Causes hyperthermia   . Codeine Nausea Only  . Latex Rash  . Sulfa Antibiotics Other (See Comments)    Do not take per nephrology    Current Problems (verified) Patient Active Problem List   Diagnosis Date Noted  . Former smoker 08/30/2019  . Atherosclerosis of aorta ([place]) 03/22/2019  . Emphysema lung ([place])   . CHF NYHA class II, acute, diastolic ([place]) [id number]  .  Osteopenia 01/16/2019  . History of colon polyps 06/28/2018  . CKD Stage 3 ([place]) 01/10/2015  . IBS  01/10/2015  . Hyperlipidemia, mixed 06/12/2014  . Medication management 06/12/2014  . LBBB (left bundle branch block) 07/24/2013  . HTN (hypertension) 07/24/2013  . Recurrent major depression ([place])   . OCD (obsessive compulsive disorder)   . Abnormal glucose   . Migraines   . Vitamin D deficiency   . Hypothyroidism 04/05/2013    Screening Tests Immunization History  Administered Date(s) Administered  . Influenza, High  Dose Seasonal PF 03/22/2017, 02/16/2018, 03/22/2019  . Influenza-Unspecified 01/31/2016  . Pneumococcal Conjugate-13 03/22/2017  . Pneumococcal Polysaccharide-23 02/12/1999, 06/29/2018  . Td 04/27/2013  . Zoster 11/24/2005    Preventative care: Last colonoscopy: 10/2012 - due f/u 10/2017 Dr. Fuller Plan, patient will schedule  Last EGD: 02/2019 Last mammogram: 11/2018 Last breast MRI: 10/2018 Last pap smear/pelvic exam: 2018, s/p total hysterectomy, DONE  DEXA: 12/2018, R fem T -2.1   former smoker, 40 pack year, quit 2004 Last CT chest 02/2019 - Last CXR 02/2019  Last ECHO: 02/2019   Prior vaccinations: TD or Tdap: 2015  Influenza: 02/2019 Pneumococcal: 2000, 2020 Prevnar13:  2018 Shingles/Zostavax: 2007 Covid 19: 2/2, pfizer, no card, patient will check dates  Names of Other Physician/Practitioners you currently use: 1. [facility] here for primary care 2. Dr. Delman Cheadle, eye doctor, last visit 2021 3. Dr. Woodfin Ganja, dentist, last visit 2020  Patient Care Team: Unk [name], MD as PCP - General (Internal Medicine) Josue Hector, MD as PCP - Cardiology (Cardiology) Rockey Situ (Urology) Donato Heinz, MD as Consulting Physician (Nephrology) Melissa Noon, [place] as Referring Physician (Optometry) Ladene Artist, MD as Consulting Physician (Gastroenterology) Josue Hector, MD as Consulting Physician  (Cardiology)  SURGICAL HISTORY She  has a past surgical history that includes Tonsillectomy; Oophorectomy; Bunionectomy; Hemorrhoid surgery; Breast enhancement surgery; Colon surgery; ORIF ankle fracture (Left, 08/30/2017); Augmentation mammaplasty; Breast implant removal (Bilateral, 03/07/2019); and Esophagogastroduodenoscopy (egd) with propofol (N/A, 03/12/2019). FAMILY HISTORY Her family history includes Colon polyps in her mother. SOCIAL HISTORY She  reports that she quit smoking about 17 years ago. Her smoking use included cigarettes. She has a 40.00 pack-year smoking history. She has never used smokeless tobacco. She reports that she does not drink alcohol or use drugs.   MEDICARE WELLNESS OBJECTIVES: Physical activity: Current Exercise Habits: The patient does not participate in regular exercise at present, Exercise limited by: None identified Cardiac risk factors: Cardiac Risk Factors include: advanced age (>63men, >79 women);sedentary lifestyle;smoking/ tobacco exposure Depression/mood screen:   Depression screen [facility] 2/9 08/30/2019  Decreased Interest 0  Down, Depressed, Hopeless 1  PHQ - 2 Score 1  Altered sleeping 0  Tired, decreased energy 2  Change in appetite 0  Feeling bad or failure about yourself  0  Trouble concentrating 0  Moving slowly or fidgety/restless 0  Suicidal thoughts 0  PHQ-9 Score 3  Difficult doing work/chores Not difficult at all    ADLs:  In your present state of health, do you have any difficulty performing the following activities: 08/30/2019 05/27/2019  Hearing? N N  Vision? N N  Difficulty concentrating or making decisions? N N  Walking or climbing stairs? N N  Dressing or bathing? N N  Doing errands, shopping? N N  Some recent data might be hidden     Cognitive Testing  Alert? Yes  Normal Appearance?Yes  Oriented to person? Yes  Place? Yes   Time? Yes  Recall of three objects?  Yes  Can perform simple calculations? Yes  Displays appropriate  judgment?Yes  Can read the correct time from a watch face?Yes  EOL planning: Does Patient Have a Medical Advance Directive?: Yes Type of Advance Directive: Healthcare Power of Attorney, Living will Does patient want to make changes to medical advance directive?: No - Patient declined Copy of [place] in Chart?: No - copy requested  Review of Systems  Constitutional: Negative for malaise/fatigue and weight loss.  HENT: Negative for hearing  loss and tinnitus.   Eyes: Negative for blurred vision and double vision.  Respiratory: Negative for cough, shortness of breath and wheezing.   Cardiovascular: Negative for chest pain, palpitations, orthopnea, claudication and leg swelling.  Gastrointestinal: Negative for abdominal pain, blood in stool, constipation, diarrhea, heartburn, melena, nausea and vomiting.  Genitourinary: Negative.   Musculoskeletal: Negative for falls, joint pain and myalgias.  Skin: Negative for rash.  Neurological: Negative for dizziness, tingling, sensory change, weakness and headaches.  Endo/Heme/Allergies: Negative for polydipsia.  Psychiatric/Behavioral: Positive for depression. Negative for memory loss, substance abuse and suicidal ideas. The patient is not nervous/anxious and does not have insomnia.   All other systems reviewed and are negative.    Objective:     Today's Vitals   08/30/19 0928  BP: 130/70  Pulse: 62  Temp: 97.6 F (36.4 C)  SpO2: 95%  Weight: 159 lb (72.1 kg)  Height: 5' 2.5" (1.588 m)  PainSc: 0-No pain   Body mass index is 28.62 kg/m.  General appearance: alert, no distress, WD/WN, female HEENT: normocephalic, sclerae anicteric, TMs pearly, nares patent, no discharge or erythema, pharynx normal Oral cavity: MMM, no lesions Neck: supple, no lymphadenopathy, no thyromegaly, no masses Heart: RRR, normal S1, S2, no murmurs Lungs: CTA bilaterally, no wheezes, rhonchi, or rales Abdomen: +bs, soft, non tender, non  distended, no masses, no hepatomegaly, no splenomegaly Musculoskeletal: nontender, no swelling, no obvious deformity Extremities: no edema, no cyanosis, no clubbing - left toe injected with healing scabs Pulses: 2+ symmetric, upper and lower extremities, normal cap refill Neurological: alert, oriented x 3, CN2-12 intact, strength normal upper extremities and lower extremities, sensation normal throughout, DTRs 2+ throughout, no cerebellar signs, gait normal Psychiatric: normal affect, behavior normal, pleasant   Medicare Attestation I have personally reviewed: The patient's medical and social history Their use of alcohol, tobacco or illicit drugs Their current medications and supplements The patient's functional ability including ADLs,fall risks, home safety risks, cognitive, and hearing and visual impairment Diet and physical activities Evidence for depression or mood disorders  The patient's weight, height, BMI, and visual acuity have been recorded in the chart.  I have made referrals, counseling, and provided education to the patient based on review of the above and I have provided the patient with a written personalized care plan for preventive services.     Izora Ribas, NP   08/30/2019

## 2019-08-30 ENCOUNTER — Encounter: Payer: Self-pay | Admitting: Adult Health

## 2019-08-30 ENCOUNTER — Ambulatory Visit (INDEPENDENT_AMBULATORY_CARE_PROVIDER_SITE_OTHER): Payer: PPO | Admitting: Adult Health

## 2019-08-30 ENCOUNTER — Encounter: Payer: Self-pay | Admitting: Gastroenterology

## 2019-08-30 ENCOUNTER — Other Ambulatory Visit: Payer: Self-pay

## 2019-08-30 VITALS — BP 130/70 | HR 62 | Temp 97.6°F | Ht 62.5 in | Wt 159.0 lb

## 2019-08-30 DIAGNOSIS — E782 Mixed hyperlipidemia: Secondary | ICD-10-CM

## 2019-08-30 DIAGNOSIS — F3341 Major depressive disorder, recurrent, in partial remission: Secondary | ICD-10-CM

## 2019-08-30 DIAGNOSIS — Z0001 Encounter for general adult medical examination with abnormal findings: Secondary | ICD-10-CM

## 2019-08-30 DIAGNOSIS — I10 Essential (primary) hypertension: Secondary | ICD-10-CM | POA: Diagnosis not present

## 2019-08-30 DIAGNOSIS — Z87891 Personal history of nicotine dependence: Secondary | ICD-10-CM

## 2019-08-30 DIAGNOSIS — I447 Left bundle-branch block, unspecified: Secondary | ICD-10-CM

## 2019-08-30 DIAGNOSIS — I700 Atherosclerosis of aorta: Secondary | ICD-10-CM | POA: Diagnosis not present

## 2019-08-30 DIAGNOSIS — J432 Centrilobular emphysema: Secondary | ICD-10-CM

## 2019-08-30 DIAGNOSIS — E559 Vitamin D deficiency, unspecified: Secondary | ICD-10-CM

## 2019-08-30 DIAGNOSIS — R6889 Other general symptoms and signs: Secondary | ICD-10-CM

## 2019-08-30 DIAGNOSIS — Z860100 Personal history of colon polyps, unspecified: Secondary | ICD-10-CM

## 2019-08-30 DIAGNOSIS — G43909 Migraine, unspecified, not intractable, without status migrainosus: Secondary | ICD-10-CM

## 2019-08-30 DIAGNOSIS — Z1211 Encounter for screening for malignant neoplasm of colon: Secondary | ICD-10-CM

## 2019-08-30 DIAGNOSIS — N1831 Chronic kidney disease, stage 3a: Secondary | ICD-10-CM | POA: Diagnosis not present

## 2019-08-30 DIAGNOSIS — M8580 Other specified disorders of bone density and structure, unspecified site: Secondary | ICD-10-CM | POA: Diagnosis not present

## 2019-08-30 DIAGNOSIS — K589 Irritable bowel syndrome without diarrhea: Secondary | ICD-10-CM

## 2019-08-30 DIAGNOSIS — R7309 Other abnormal glucose: Secondary | ICD-10-CM | POA: Diagnosis not present

## 2019-08-30 DIAGNOSIS — F900 Attention-deficit hyperactivity disorder, predominantly inattentive type: Secondary | ICD-10-CM

## 2019-08-30 DIAGNOSIS — E039 Hypothyroidism, unspecified: Secondary | ICD-10-CM

## 2019-08-30 DIAGNOSIS — Z79899 Other long term (current) drug therapy: Secondary | ICD-10-CM

## 2019-08-30 DIAGNOSIS — Z0000 Encounter for general adult medical examination without abnormal findings: Secondary | ICD-10-CM

## 2019-08-30 DIAGNOSIS — F429 Obsessive-compulsive disorder, unspecified: Secondary | ICD-10-CM

## 2019-08-30 DIAGNOSIS — I5031 Acute diastolic (congestive) heart failure: Secondary | ICD-10-CM

## 2019-08-30 MED ORDER — ATENOLOL 100 MG PO TABS: ORAL_TABLET | ORAL | 3 refills | Status: DC

## 2019-08-30 NOTE — Patient Instructions (Addendum)
   [name] , Thank you for taking time to come for your Medicare Wellness Visit. I appreciate your ongoing commitment to your health goals. Please review the following plan we discussed and let me know if I can assist you in the future.   These are the goals we discussed: Goals    . Exercise 150 min/wk Moderate Activity       This is a list of the screening recommended for you and due dates:  Health Maintenance  Topic Date Due  . COVID-19 Vaccine (1) Never done  . Colon Cancer Screening  11/16/2017  . Flu Shot  11/26/2019  . Mammogram  11/30/2020  . Tetanus Vaccine  04/28/2023  . DEXA scan (bone density measurement)  Completed  .  Hepatitis C: One time screening is recommended by Center for Disease Control  (CDC) for  adults born from [birth year] through 1964.   Completed  . Pneumonia vaccines  Completed    Please message me with which estrogen cream.   Please schedule a follow up with Dr. Johnsie Cancel   Please cut atenolol to 1/2 tab, monitor pulse and BP, call if persistently above goal 130/80 will add HCTZ   [employer] with no obligation # [phone] Do not have to be a member Tues-Sat 10-6  [place]- free test with no obligation # [phone] MUST BE A MEMBER Call for store hours  Have had patient's get good cheaper hearing aids from [employer] The air version has good reviews.

## 2019-08-31 LAB — CBC WITH DIFFERENTIAL/PLATELET
Absolute Monocytes: 387 cells/uL (ref 200–950)
Basophils Absolute: 42 cells/uL (ref 0–200)
Basophils Relative: 0.8 %
Eosinophils Absolute: 360 cells/uL (ref 15–500)
Eosinophils Relative: 6.8 %
HCT: 42.6 % (ref 35.0–45.0)
Hemoglobin: 13.9 g/dL (ref 11.7–15.5)
Lymphs Abs: 1754 cells/uL (ref 850–3900)
MCH: 31 pg (ref 27.0–33.0)
MCHC: 32.6 g/dL (ref 32.0–36.0)
MCV: 94.9 fL (ref 80.0–100.0)
MPV: 10.1 fL (ref 7.5–12.5)
Monocytes Relative: 7.3 %
Neutro Abs: 2756 cells/uL (ref 1500–7800)
Neutrophils Relative %: 52 %
Platelets: 302 10*3/uL (ref 140–400)
RBC: 4.49 10*6/uL (ref 3.80–5.10)
RDW: 13 % (ref 11.0–15.0)
Total Lymphocyte: 33.1 %
WBC: 5.3 10*3/uL (ref 3.8–10.8)

## 2019-08-31 LAB — COMPLETE METABOLIC PANEL WITH GFR
AG Ratio: 1.3 (calc) (ref 1.0–2.5)
ALT: 13 U/L (ref 6–29)
AST: 17 U/L (ref 10–35)
Albumin: 3.6 g/dL (ref 3.6–5.1)
Alkaline phosphatase (APISO): 73 U/L (ref 37–153)
BUN/Creatinine Ratio: 19 (calc) (ref 6–22)
BUN: 24 mg/dL (ref 7–25)
CO2: 32 mmol/L (ref 20–32)
Calcium: 9.2 mg/dL (ref 8.6–10.4)
Chloride: 103 mmol/L (ref 98–110)
Creat: 1.29 mg/dL — ABNORMAL HIGH (ref 0.50–0.99)
GFR, Est African American: 49 mL/min/{1.73_m2} — ABNORMAL LOW (ref >=60)
GFR, Est Non African American: 43 mL/min/{1.73_m2} — ABNORMAL LOW (ref >=60)
Globulin: 2.7 g/dL (calc) (ref 1.9–3.7)
Glucose, Bld: 98 mg/dL (ref 65–99)
Potassium: 4.2 mmol/L (ref 3.5–5.3)
Sodium: 139 mmol/L (ref 135–146)
Total Bilirubin: 0.4 mg/dL (ref 0.2–1.2)
Total Protein: 6.3 g/dL (ref 6.1–8.1)

## 2019-08-31 LAB — LIPID PANEL
Cholesterol: 183 mg/dL (ref <200)
HDL: 54 mg/dL (ref >=50)
LDL Cholesterol (Calc): 111 mg/dL (calc) — ABNORMAL HIGH
Non-HDL Cholesterol (Calc): 129 mg/dL (calc) (ref <130)
Total CHOL/HDL Ratio: 3.4 (calc) (ref <5.0)
Triglycerides: 89 mg/dL (ref <150)

## 2019-08-31 LAB — MAGNESIUM: Magnesium: 2.2 mg/dL (ref 1.5–2.5)

## 2019-08-31 LAB — TSH: TSH: 2.91 mIU/L (ref 0.40–4.50)

## 2019-09-03 DIAGNOSIS — Z03818 Encounter for observation for suspected exposure to other biological agents ruled out: Secondary | ICD-10-CM | POA: Diagnosis not present

## 2019-09-03 DIAGNOSIS — Z20828 Contact with and (suspected) exposure to other viral communicable diseases: Secondary | ICD-10-CM | POA: Diagnosis not present

## 2019-09-03 DIAGNOSIS — Z20822 Contact with and (suspected) exposure to covid-19: Secondary | ICD-10-CM | POA: Diagnosis not present

## 2019-09-06 ENCOUNTER — Other Ambulatory Visit: Payer: Self-pay

## 2019-09-06 ENCOUNTER — Telehealth: Payer: PPO | Admitting: *Deleted

## 2019-09-06 ENCOUNTER — Ambulatory Visit (HOSPITAL_COMMUNITY)
Admission: RE | Admit: 2019-09-06 | Discharge: 2019-09-06 | Disposition: A | Discharge status: Home / Self Care | Payer: PPO | Source: Ambulatory Visit | Attending: Adult Health | Admitting: Adult Health

## 2019-09-06 ENCOUNTER — Encounter: Payer: Self-pay | Admitting: *Deleted

## 2019-09-06 DIAGNOSIS — R05 Cough: Secondary | ICD-10-CM | POA: Diagnosis not present

## 2019-09-06 DIAGNOSIS — Z03818 Encounter for observation for suspected exposure to other biological agents ruled out: Secondary | ICD-10-CM | POA: Diagnosis not present

## 2019-09-06 DIAGNOSIS — R058 Other specified cough: Secondary | ICD-10-CM

## 2019-09-06 DIAGNOSIS — J441 Chronic obstructive pulmonary disease with (acute) exacerbation: Secondary | ICD-10-CM

## 2019-09-06 DIAGNOSIS — Z20828 Contact with and (suspected) exposure to other viral communicable diseases: Secondary | ICD-10-CM | POA: Diagnosis not present

## 2019-09-06 MED ORDER — PROMETHAZINE-DM 6.25-15 MG/5ML PO SYRP: 5.0000 mL | ORAL_SOLUTION | Freq: Four times a day (QID) | ORAL | 1 refills | Status: DC | PRN

## 2019-09-06 MED ORDER — ALBUTEROL SULFATE HFA 108 (90 BASE) MCG/ACT IN AERS: 1.0000 | INHALATION_SPRAY | RESPIRATORY_TRACT | 1 refills | Status: DC | PRN

## 2019-09-06 MED ORDER — PREDNISONE 20 MG PO TABS: ORAL_TABLET | ORAL | 0 refills | Status: DC

## 2019-09-06 MED ORDER — AZITHROMYCIN 250 MG PO TABS: ORAL_TABLET | ORAL | 1 refills | Status: AC

## 2019-09-06 NOTE — Telephone Encounter (Signed)
Patient called and complained of a cough, that she had at her 08/30/2019 OV with you. She had a negative Covid test on 09/02/2019. She continues to cough and has trouble sleeping, due to cough. Please advise.

## 2019-09-06 NOTE — Telephone Encounter (Signed)
Virtual Visit via Telephone Note  I connected with [name] on 09/06/2019 at 14:30 by telephone and verified that I am speaking with the correct person using two identifiers.  Location: Patient: home Provider: [place] office    I discussed the limitations, risks, security and privacy concerns of performing an evaluation and management service by telephone and the availability of in person appointments. I also discussed with the patient that there may be a patient responsible charge related to this service. The patient expressed understanding and agreed to proceed.   History of Present Illness:  118/53, P 58,   69 y.o. hx of diastolic CHF, emphysema per CXR (former smoker, 40 pack year history) with seasonal allergies called to report worsening progressive cough.   She reports last week had mild tickle in throat, nonproductive cough last week, 08/30/2019, progressive, some congestion, mild sore throat, now progressive and productive of thick mucus, cough preventing sleep, denies dyspnea, She does endorse wheezing, denies CP or chest tightness. Denies edema, PND. Denies HA, fever/chills, dizziness, GI sx, myalgias/arthralgias, rash.   09/02/2019 have negative rapid covid 19 at [facility], no known sick contacts, did have covid vaccine 2/2, pfizer in March.   Checks weight daily, reports stable from baseline; retired [employer] scientist, doesn't feel this is fluid overload. "bad case of bronchitis," most concerned about not being able to sleep due to cough.   Hasn't tried any interventions, doesn't have inhaler.   Allergies:  Allergies  Allergen Reactions  . Ace Inhibitors Other (See Comments)    Acute renal failure (lisinopril)  . Cephalexin Anaphylaxis  . Dilaudid [Hydromorphone Hcl] Anaphylaxis  . Robaxin [Methocarbamol] Anaphylaxis  . Macrobid [Nitrofurantoin] Other (See Comments)    Causes hyperthermia   . Codeine Nausea Only  . Latex Rash  . Sulfa Antibiotics Other (See Comments)    Do not take  per nephrology   Medical History:  has Hypothyroidism; Recurrent major depression ([place]); OCD (obsessive compulsive disorder); Abnormal glucose; Migraines; Vitamin D deficiency; LBBB (left bundle branch block); HTN (hypertension); Hyperlipidemia, mixed; Medication management; CKD Stage 3 ([place]); IBS ; History of colon polyps; Osteopenia; CHF NYHA class II, acute, diastolic ([place]); Atherosclerosis of aorta ([place]); Emphysema lung ([place]); and Former smoker on their problem list. Surgical History:  She  has a past surgical history that includes Tonsillectomy; Oophorectomy; Bunionectomy; Hemorrhoid surgery; Breast enhancement surgery; Colon surgery; ORIF ankle fracture (Left, 08/30/2017); Augmentation mammaplasty; Breast implant removal (Bilateral, 03/07/2019); and Esophagogastroduodenoscopy (egd) with propofol (N/A, 03/12/2019). Family History:  Herfamily history includes Colon polyps in her mother. Social History:   reports that she quit smoking about 17 years ago. Her smoking use included cigarettes. She has a 40.00 pack-year smoking history. She has never used smokeless tobacco. She reports that she does not drink alcohol or use drugs.    Observations/Objective:  General : Ill sounding patient, frequent coughing, in no acute distress HEENT: some hoarseness, frequent coarse hacking cough for duration of visit Lungs: speaks in complete sentences, no audible wheezing, no apparent distress Neurological: alert, oriented x 3 Psychiatric: pleasant, judgement appropriate    Assessment and Plan:  [name] was seen today for advice only.  Diagnoses and all orders for this visit:  Productive cough COPD with exacerbation ([place]) Suggested symptomatic OTC remedies. Nasal saline spray for congestion. Nasal steroids, allergy pill, oral steroids offered Mucinex for thick secretions Push fluids Monitor O2 and breathing; if <88% go to ED Check CXR to r/o pneumonia, fluid on lungs Keep in close contact via  telephone  and mychart, follow up with any changes Go to the ER if any chest pain, shortness of breath, nausea, dizziness, severe HA, changes vision/speech -     DG Chest 2 View; Future -     promethazine-dextromethorphan (PROMETHAZINE-DM) 6.25-15 MG/5ML syrup; Take 5 mLs by mouth 4 (four) times daily as needed for cough. -     azithromycin (ZITHROMAX) 250 MG tablet; Take 2 tablets (500 mg) on  Day 1,  followed by 1 tablet (250 mg) once daily on Days 2 through 5. -     predniSONE (DELTASONE) 20 MG tablet; 2 tablets daily for 3 days, 1 tablet daily for 4 days. -     albuterol (VENTOLIN HFA) 108 (90 Base) MCG/ACT inhaler; Inhale 1-2 puffs into the lungs every 4 (four) hours as needed for wheezing or shortness of breath.   Follow Up Instructions:    I discussed the assessment and treatment plan with the patient. The patient was provided an opportunity to ask questions and all were answered. The patient agreed with the plan and demonstrated an understanding of the instructions.   The patient was advised to call back or seek an in-person evaluation if the symptoms worsen or if the condition fails to improve as anticipated.  I provided 15 minutes of non-face-to-face time during this encounter.   Izora Ribas, NP

## 2019-09-06 NOTE — Addendum Note (Signed)
Addended by: Izora Ribas on: 09/06/2019 03:26 PM   Modules accepted: Orders, Level of Service

## 2019-09-09 ENCOUNTER — Other Ambulatory Visit: Payer: Self-pay | Admitting: Internal Medicine

## 2019-09-09 DIAGNOSIS — I10 Essential (primary) hypertension: Secondary | ICD-10-CM

## 2019-09-10 ENCOUNTER — Other Ambulatory Visit: Payer: Self-pay | Admitting: Physician Assistant

## 2019-09-11 MED FILL — ATENOLOL 100 MG TABLET: 100 | 90 days supply | Qty: 90 | Fill #0

## 2019-09-30 ENCOUNTER — Other Ambulatory Visit: Payer: Self-pay | Admitting: Adult Health

## 2019-09-30 DIAGNOSIS — Z136 Encounter for screening for cardiovascular disorders: Secondary | ICD-10-CM

## 2019-09-30 DIAGNOSIS — I5031 Acute diastolic (congestive) heart failure: Secondary | ICD-10-CM

## 2019-10-03 ENCOUNTER — Encounter: Payer: Self-pay | Admitting: Adult Health

## 2019-10-03 ENCOUNTER — Other Ambulatory Visit: Payer: Self-pay

## 2019-10-03 ENCOUNTER — Ambulatory Visit (INDEPENDENT_AMBULATORY_CARE_PROVIDER_SITE_OTHER): Payer: PPO | Admitting: Adult Health

## 2019-10-03 ENCOUNTER — Telehealth: Payer: PPO

## 2019-10-03 VITALS — BP 106/64 | HR 63 | Temp 97.7°F | Ht 62.5 in | Wt 161.2 lb

## 2019-10-03 DIAGNOSIS — B029 Zoster without complications: Secondary | ICD-10-CM

## 2019-10-03 DIAGNOSIS — J441 Chronic obstructive pulmonary disease with (acute) exacerbation: Secondary | ICD-10-CM | POA: Diagnosis not present

## 2019-10-03 MED ORDER — PROMETHAZINE-DM 6.25-15 MG/5ML PO SYRP: 5.0000 mL | ORAL_SOLUTION | Freq: Four times a day (QID) | ORAL | 1 refills | Status: DC | PRN

## 2019-10-03 MED ORDER — VALACYCLOVIR HCL 1 G PO TABS: 1000.0000 mg | ORAL_TABLET | Freq: Three times a day (TID) | ORAL | 0 refills | Status: DC

## 2019-10-03 MED ORDER — BENZONATATE 200 MG PO CAPS: ORAL_CAPSULE | ORAL | 1 refills | Status: DC

## 2019-10-03 NOTE — Progress Notes (Signed)
Assessment and Plan:  [name] was seen today for herpes zoster.  Diagnoses and all orders for this visit:  Herpes zoster without complication Herpes Zoster- valcyclovir 1000 mg TID initiated <72 hours Declines steroid or gabapentin/lyrica, states pain is mild, will try tylenol May call back for gabapentin or steroid taper at any point  The patient was informed that the vesicles can cause chicken pox in a person that is not immune, avoid pregnant individuals, will follow up PRN.  Discussed consider shingrix vaccine later this year; she will check with insurance -     valACYclovir (VALTREX) 1000 MG tablet; Take 1 tablet (1,000 mg total) by mouth 3 (three) times daily. Take for 7 days  Over 15 minutes of exam, counseling, chart review, and critical decision making was performed.   Future Appointments  Date Time Provider [place]  10/25/2019  9:00 AM [facility] PREVISIT RM 51 [facility]  11/08/2019  9:30 AM Ladene Artist, MD [facility]  11/29/2019  9:30 AM Unk [name], MD [facility] None  05/24/2020 11:00 AM Unk [name], MD [facility] None    ------------------------------------------------------------------------------------------------------------------   HPI BP 106/64   Pulse 63   Temp 97.7 F (36.5 C)   Ht 5' 2.5" (1.588 m)   Wt 161 lb 3.2 oz (73.1 kg)   SpO2 97%   BMI 29.01 kg/m   69 y.o.female presents for evaluation of possible shingles rash. She reports rash appeared to left flank and torso, unilateral, appeared 5/6, didn't burn or itch initially, mild burning began this AM. Erythematous rash with blisters. States feels mildly fatigued, "like I have the flu" but otherwise denies notable other sx. Hasn't tried anything. States pain is mild.   Had zostavax in 2007.   Past Medical History:  Diagnosis Date  . ADHD (attention deficit hyperactivity disorder)   . AKI (acute kidney injury) ([place]) 03/09/2019  . Chronic kidney disease (CKD), stage  III (moderate)   . Diverticulosis   . Emphysema lung ([place])   . Encephalopathy, metabolic 10/24/5282  . Helicobacter pylori (H. pylori)   . IBS (irritable bowel syndrome)   . LBBB (left bundle branch block)   . Lower urinary tract infection 06/21/2017  . Migraines   . OCD (obsessive compulsive disorder)   . Prediabetes    pcp  . Renal disorder    AKI due to lisinpril, his. pyelonephritis  . Sepsis ([place])   . Sleep apnea    no CPAP  . Vitamin D deficiency      Allergies  Allergen Reactions  . Ace Inhibitors Other (See Comments)    Acute renal failure (lisinopril)  . Cephalexin Anaphylaxis  . Dilaudid [Hydromorphone Hcl] Anaphylaxis  . Robaxin [Methocarbamol] Anaphylaxis  . Macrobid [Nitrofurantoin] Other (See Comments)    Causes hyperthermia   . Codeine Nausea Only  . Latex Rash  . Sulfa Antibiotics Other (See Comments)    Do not take per nephrology    Current Outpatient Medications on File Prior to Visit  Medication Sig  . albuterol (VENTOLIN HFA) 108 (90 Base) MCG/ACT inhaler Inhale 1-2 puffs into the lungs every 4 (four) hours as needed for wheezing or shortness of breath.  . ALPRAZolam (XANAX) 0.5 MG tablet TAKE 1/2 TO 1 TABLET BY MOUTH THREE TIMES A DAY IF NEEDED FOR ANXIETY OR SLEEP (Patient taking differently: Take 0.25-0.5 mg by mouth 3 (three) times daily as needed for anxiety or sleep. )  . ascorbic acid (VITAMIN C) 250 MG tablet Take by mouth.  Marland Kitchen aspirin  EC 81 MG tablet Take 81 mg by mouth daily.  Marland Kitchen atenolol (TENORMIN) 100 MG tablet TAKE 1 TABLET BY MOUTH ONCE DAILY FOR BP  . buPROPion (WELLBUTRIN XL) 150 MG 24 hr tablet Take 3 tablets (450 mg) every Morning for Mood & Depression  . Cholecalciferol (VITAMIN D-3) 5000 UNITS TABS Take 5,000 Units by mouth daily.   Marland Kitchen CRANBERRY PO Take 1 capsule by mouth daily.  Marland Kitchen docusate sodium (COLACE) 100 MG capsule Take 100 mg by mouth 2 (two) times daily as needed for mild constipation.  Marland Kitchen estradiol (ESTRACE) 0.1 MG/GM vaginal  cream Place 1 Applicatorful vaginally at bedtime.  . famotidine (PEPCID) 20 MG tablet Take 1 tablet 2 x  /day for Indigestion & Heartburn  . hyoscyamine (LEVBID) 0.375 MG 12 hr tablet Take 1 tablet 2 x /day for IBS  . hyoscyamine (LEVSIN SL) 0.125 MG SL tablet Dissolve 1 tablet under tongue every 4 hours if Needed for Nausea, Bloating, Cramping or Diarrhea  . levothyroxine (SYNTHROID) 125 MCG tablet Take 1 tablet daily on an empty stomach with only water for 30 minutes & no Antacid meds, Calcium or Magnesium for 4 hours & avoid Biotin  . Magnesium 250 MG TABS Take 250 mg by mouth daily.  . ondansetron (ZOFRAN) 8 MG tablet TAKE 1 TABLET BY MOUTH 3 TIMES A DAY AS NEEDED FOR NAUSEA (Patient taking differently: Take 8 mg by mouth 3 (three) times daily as needed for nausea. )  . OVER THE COUNTER MEDICATION OTC Vitamin C gummy 2 daily.  . pantoprazole (PROTONIX) 40 MG tablet Take 1 tablet (40 mg total) by mouth daily.  . sertraline (ZOLOFT) 100 MG tablet Take 1 tablet Daily for Mood / Depression (Patient taking differently: Take 100 mg by mouth daily. )  . zinc gluconate 50 MG tablet Take 50 mg by mouth daily.   No current facility-administered medications on file prior to visit.    ROS: all negative except above.   Physical Exam:  BP 106/64   Pulse 63   Temp 97.7 F (36.5 C)   Ht 5' 2.5" (1.588 m)   Wt 161 lb 3.2 oz (73.1 kg)   SpO2 97%   BMI 29.01 kg/m   General Appearance: Well nourished, in no apparent distress. Eyes: PERRLA, conjunctiva no swelling or erythema ENT/Mouth: Ext aud canals clear, TMs without erythema, bulging. No erythema, swelling, or exudate on post pharynx.  Tonsils not swollen or erythematous. Hearing normal.  Neck: Supple Respiratory: Respiratory effort normal, BS equal bilaterally without rales, rhonchi, wheezing or stridor.  Cardio: RRR with no MRGs. Brisk peripheral pulses without edema.  Abdomen: Soft, + BS.  Non tender, no guarding, rebound, hernias,  masses. Lymphatics: Non tender without lymphadenopathy.  Musculoskeletal: No obvious deformity, symmetrical strength, normal gait.  Skin: Warm, dry; she has vesicular rash with erythematous base following linear pattern around left flank/abdomen, not crossing midline Neuro: Normal muscle tone,  Sensation intact.  Psych: Awake and oriented X 3, normal affect, Insight and Judgment appropriate.     Izora Ribas, NP 5:16 PM [facility]

## 2019-10-03 NOTE — Patient Instructions (Signed)
Shingles  Shingles, which is also known as herpes zoster, is an infection that causes a painful skin rash and fluid-filled blisters. It is caused by a virus. Shingles only develops in people who:  Have had chickenpox.  Have been given a medicine to protect against chickenpox (have been vaccinated). Shingles is rare in this group. What are the causes? Shingles is caused by varicella-zoster virus (VZV). This is the same virus that causes chickenpox. After a person is exposed to VZV, the virus stays in the body in an inactive (dormant) state. Shingles develops if the virus is reactivated. This can happen many years after the first (initial) exposure to VZV. It is not known what causes this virus to be reactivated. What increases the risk? People who have had chickenpox or received the chickenpox vaccine are at risk for shingles. Shingles infection is more common in people who:  Are older than age 60.  Have a weakened disease-fighting system (immune system), such as people with: ? HIV. ? AIDS. ? Cancer.  Are taking medicines that weaken the immune system, such as transplant medicines.  Are experiencing a lot of stress. What are the signs or symptoms? Early symptoms of this condition include itching, tingling, and pain in an area on your skin. Pain may be described as burning, stabbing, or throbbing. A few days or weeks after early symptoms start, a painful red rash appears. The rash is usually on one side of the body and has a band-like or belt-like pattern. The rash eventually turns into fluid-filled blisters that break open, change into scabs, and dry up in about 2-3 weeks. At any time during the infection, you may also develop:  A fever.  Chills.  A headache.  An upset stomach. How is this diagnosed? This condition is diagnosed with a skin exam. Skin or fluid samples may be taken from the blisters before a diagnosis is made. These samples are examined under a microscope or sent to  a lab for testing. How is this treated? The rash may last for several weeks. There is not a specific cure for this condition. Your health care provider will probably prescribe medicines to help you manage pain, recover more quickly, and avoid long-term problems. Medicines may include:  Antiviral drugs.  Anti-inflammatory drugs.  Pain medicines.  Anti-itching medicines (antihistamines). If the area involved is on your face, you may be referred to a specialist, such as an eye doctor (ophthalmologist) or an ear, nose, and throat (ENT) doctor (otolaryngologist) to help you avoid eye problems, chronic pain, or disability. Follow these instructions at home: Medicines  Take over-the-counter and prescription medicines only as told by your health care provider.  Apply an anti-itch cream or numbing cream to the affected area as told by your health care provider. Relieving itching and discomfort   Apply cold, wet cloths (cold compresses) to the area of the rash or blisters as told by your health care provider.  Cool baths can be soothing. Try adding baking soda or dry oatmeal to the water to reduce itching. Do not bathe in hot water. Blister and rash care  Keep your rash covered with a loose bandage (dressing). Wear loose-fitting clothing to help ease the pain of material rubbing against the rash.  Keep your rash and blisters clean by washing the area with mild soap and cool water as told by your health care provider.  Check your rash every day for signs of infection. Check for: ? More redness, swelling, or pain. ? Fluid   or blood. ? Warmth. ? Pus or a bad smell.  Do not scratch your rash or pick at your blisters. To help avoid scratching: ? Keep your fingernails clean and cut short. ? Wear gloves or mittens while you sleep, if scratching is a problem. General instructions  Rest as told by your health care provider.  Keep all follow-up visits as told by your health care provider. This  is important.  Wash your hands often with soap and water. If soap and water are not available, use hand sanitizer. Doing this lowers your chance of getting a bacterial skin infection.  Before your blisters change into scabs, your shingles infection can cause chickenpox in people who have never had it or have never been vaccinated against it. To prevent this from happening, avoid contact with other people, especially: ? Babies. ? Pregnant women. ? Children who have eczema. ? Elderly people who have transplants. ? People who have chronic illnesses, such as cancer or AIDS. Contact a health care provider if:  Your pain is not relieved with prescribed medicines.  Your pain does not get better after the rash heals.  You have signs of infection in the rash area, such as: ? More redness, swelling, or pain around the rash. ? Fluid or blood coming from the rash. ? The rash area feeling warm to the touch. ? Pus or a bad smell coming from the rash. Get help right away if:  The rash is on your face or nose.  You have facial pain, pain around your eye area, or loss of feeling on one side of your face.  You have difficulty seeing.  You have ear pain or have ringing in your ear.  You have a loss of taste.  Your condition gets worse. Summary  Shingles, which is also known as herpes zoster, is an infection that causes a painful skin rash and fluid-filled blisters.  This condition is diagnosed with a skin exam. Skin or fluid samples may be taken from the blisters and examined before the diagnosis is made.  Keep your rash covered with a loose bandage (dressing). Wear loose-fitting clothing to help ease the pain of material rubbing against the rash.  Before your blisters change into scabs, your shingles infection can cause chickenpox in people who have never had it or have never been vaccinated against it. This information is not intended to replace advice given to you by your health care  provider. Make sure you discuss any questions you have with your health care provider. Document Revised: 08/05/2018 Document Reviewed: 12/16/2016 Elsevier Patient Education  [place].   Valacyclovir caplets What is this medicine? VALACYCLOVIR (val ay SYE kloe veer) is an antiviral medicine. It is used to treat or prevent infections caused by certain kinds of viruses. Examples of these infections include herpes and shingles. This medicine will not cure herpes. This medicine may be used for other purposes; ask your health care provider or pharmacist if you have questions. COMMON BRAND NAME(S): Valtrex What should I tell my health care provider before I take this medicine? They need to know if you have any of these conditions:  acquired immunodeficiency syndrome (AIDS)  any other condition that may weaken the immune system  bone marrow or kidney transplant  kidney disease  an unusual or allergic reaction to valacyclovir, acyclovir, ganciclovir, valganciclovir, other medicines, foods, dyes, or preservatives  pregnant or trying to get pregnant  breast-feeding How should I use this medicine? Take this medicine by mouth  with a glass of water. Follow the directions on the prescription label. You can take this medicine with or without food. Take your doses at regular intervals. Do not take your medicine more often than directed. Finish the full course prescribed by your doctor or health care professional even if you think your condition is better. Do not stop taking except on the advice of your doctor or health care professional. Talk to your pediatrician regarding the use of this medicine in children. While this drug may be prescribed for children as young as 2 years for selected conditions, precautions do apply. Overdosage: If you think you have taken too much of this medicine contact a poison control center or emergency room at once. NOTE: This medicine is only for you. Do not share  this medicine with others. What if I miss a dose? If you miss a dose, take it as soon as you can. If it is almost time for your next dose, take only that dose. Do not take double or extra doses. What may interact with this medicine? Do not take this medicine with any of the following medications:  cidofovir This medicine may also interact with the following medications:  adefovir  amphotericin B  certain antibiotics like amikacin, gentamicin, tobramycin, vancomycin  cimetidine  cisplatin  colistin  cyclosporine  foscarnet  lithium  methotrexate  probenecid  tacrolimus This list may not describe all possible interactions. Give your health care provider a list of all the medicines, herbs, non-prescription drugs, or dietary supplements you use. Also tell them if you smoke, drink alcohol, or use illegal drugs. Some items may interact with your medicine. What should I watch for while using this medicine? Tell your doctor or health care professional if your symptoms do not start to get better after 1 week. This medicine works best when taken early in the course of an infection, within the first 24 hours. Begin treatment as soon as possible after the first signs of infection like tingling, itching, or pain in the affected area. It is possible that genital herpes may still be spread even when you are not having symptoms. Always use safer sex practices like condoms made of latex or polyurethane whenever you have sexual contact. You should stay well hydrated while taking this medicine. Drink plenty of fluids. What side effects may I notice from receiving this medicine? Side effects that you should report to your doctor or health care professional as soon as possible:  allergic reactions like skin rash, itching or hives, swelling of the face, lips, or tongue  aggressive behavior  confusion  hallucinations  problems with balance, talking, walking  stomach  pain  tremor  trouble passing urine or change in the amount of urine Side effects that usually do not require medical attention (report to your doctor or health care professional if they continue or are bothersome):  dizziness  headache  nausea, vomiting This list may not describe all possible side effects. Call your doctor for medical advice about side effects. You may report side effects to FDA at 1-800-FDA-1088. Where should I keep my medicine? Keep out of the reach of children. Store at room temperature between 15 and 25 degrees C (59 and 77 degrees F). Keep container tightly closed. Throw away any unused medicine after the expiration date. NOTE: This sheet is a summary. It may not cover all possible information. If you have questions about this medicine, talk to your doctor, pharmacist, or health care provider.  2020 Elsevier/Gold Standard (  2018-05-10 12:22:33)     Zoster Vaccine, Recombinant injection What is this medicine? ZOSTER VACCINE (ZOS ter vak SEEN) is used to prevent shingles in adults 68 years old and over. This vaccine is not used to treat shingles or nerve pain from shingles. This medicine may be used for other purposes; ask your health care provider or pharmacist if you have questions. COMMON BRAND NAME(S): [facility] What should I tell my health care provider before I take this medicine? They need to know if you have any of these conditions:  blood disorders or disease  cancer like leukemia or lymphoma  immune system problems or therapy  an unusual or allergic reaction to vaccines, other medications, foods, dyes, or preservatives  pregnant or trying to get pregnant  breast-feeding How should I use this medicine? This vaccine is for injection in a muscle. It is given by a health care professional. Talk to your pediatrician regarding the use of this medicine in children. This medicine is not approved for use in children. Overdosage: If you think you have  taken too much of this medicine contact a poison control center or emergency room at once. NOTE: This medicine is only for you. Do not share this medicine with others. What if I miss a dose? Keep appointments for follow-up (booster) doses as directed. It is important not to miss your dose. Call your doctor or health care professional if you are unable to keep an appointment. What may interact with this medicine?  medicines that suppress your immune system  medicines to treat cancer  steroid medicines like prednisone or cortisone This list may not describe all possible interactions. Give your health care provider a list of all the medicines, herbs, non-prescription drugs, or dietary supplements you use. Also tell them if you smoke, drink alcohol, or use illegal drugs. Some items may interact with your medicine. What should I watch for while using this medicine? Visit your doctor for regular check ups. This vaccine, like all vaccines, may not fully protect everyone. What side effects may I notice from receiving this medicine? Side effects that you should report to your doctor or health care professional as soon as possible:  allergic reactions like skin rash, itching or hives, swelling of the face, lips, or tongue  breathing problems Side effects that usually do not require medical attention (report these to your doctor or health care professional if they continue or are bothersome):  chills  headache  fever  nausea, vomiting  redness, warmth, pain, swelling or itching at site where injected  tiredness This list may not describe all possible side effects. Call your doctor for medical advice about side effects. You may report side effects to FDA at 1-800-FDA-1088. Where should I keep my medicine? This vaccine is only given in a clinic, pharmacy, doctor's office, or other health care setting and will not be stored at home. NOTE: This sheet is a summary. It may not cover all possible  information. If you have questions about this medicine, talk to your doctor, pharmacist, or health care provider.  2020 Elsevier/Gold Standard (2016-11-23 13:20:30)

## 2019-10-20 ENCOUNTER — Other Ambulatory Visit: Payer: Self-pay | Admitting: Adult Health

## 2019-10-20 DIAGNOSIS — J441 Chronic obstructive pulmonary disease with (acute) exacerbation: Secondary | ICD-10-CM

## 2019-10-20 MED ORDER — PROMETHAZINE-DM 6.25-15 MG/5ML PO SYRP: 5.0000 mL | ORAL_SOLUTION | Freq: Four times a day (QID) | ORAL | 1 refills | Status: DC | PRN

## 2019-10-20 MED ORDER — GABAPENTIN 100 MG PO CAPS: 100.0000 mg | ORAL_CAPSULE | Freq: Three times a day (TID) | ORAL | 2 refills | Status: DC | PRN

## 2019-10-25 ENCOUNTER — Ambulatory Visit (AMBULATORY_SURGERY_CENTER): Payer: Self-pay

## 2019-10-25 ENCOUNTER — Telehealth: Payer: Self-pay

## 2019-10-25 ENCOUNTER — Encounter: Payer: Self-pay | Admitting: Gastroenterology

## 2019-10-25 ENCOUNTER — Other Ambulatory Visit: Payer: Self-pay

## 2019-10-25 VITALS — Ht 62.5 in | Wt 169.2 lb

## 2019-10-25 DIAGNOSIS — Z86010 Personal history of colonic polyps: Secondary | ICD-10-CM

## 2019-10-25 NOTE — Telephone Encounter (Signed)
Attempted to reach pt to let her know that Dr. Fuller Plan wants her to see her cardiologist to evaluate concern r/t CHF on 12/06/19.    After evaluated she is to call back to r/s colonoscopy.  WAS NOT able to reach pt.  Message left.

## 2019-10-25 NOTE — Telephone Encounter (Signed)
Dr. Fuller Plan:  First thing pt says in previsit is that she believes she has CHF  During interview it was noted she has a deep rolling cough, believes she is in CHF.  Wt gains of 9lbs in a wk.    Has seen medical doctor and been referred to Cardiologist for further evaluation on 12/06/19 -   Would you like to delay procedure until have she until after the  cardiac evaluation?  Thanks

## 2019-10-25 NOTE — Progress Notes (Signed)
No allergies to soy or egg Pt is not on blood thinners or diet pills Denies issues with sedation/intubation Denies atrial flutter/fib Denies constipation    Pt is aware of Covid safety and care partner requirements.   Pt has a deep rolling cough, believes she is in CHF.  Wt gains of 9lbs in a wk.  Has seen medical doctor and been referred to Cardiologist for further evaluation on 12/06/19 - TE sent to Dr. Fuller Plan to see if would like to wait for procedure until after cardiac evaluation.

## 2019-10-25 NOTE — Telephone Encounter (Signed)
Called pt back r/t upcoming Cardiology appt for CHF  Notified pt that Dr. Fuller Plan wanted her to see her cardiologist on 12/06/19.  Once evaluated and cleared for colonoscopy to call back to reschedule all.    Asked pt to have her cardiologist to include in his note that he clears her for colonoscopy once she is evaluated.

## 2019-10-25 NOTE — Telephone Encounter (Signed)
Patient is returned your call

## 2019-10-25 NOTE — Telephone Encounter (Signed)
Yes postpone colonoscopy until cardiac evaluation has been completed.

## 2019-10-26 ENCOUNTER — Other Ambulatory Visit: Payer: Self-pay | Admitting: Adult Health

## 2019-10-27 ENCOUNTER — Other Ambulatory Visit: Payer: Self-pay

## 2019-10-27 ENCOUNTER — Ambulatory Visit (HOSPITAL_COMMUNITY)
Admission: RE | Admit: 2019-10-27 | Discharge: 2019-10-27 | Disposition: A | Discharge status: Home / Self Care | Payer: PPO | Source: Ambulatory Visit | Attending: Adult Health | Admitting: Adult Health

## 2019-10-27 ENCOUNTER — Ambulatory Visit (INDEPENDENT_AMBULATORY_CARE_PROVIDER_SITE_OTHER): Payer: PPO | Admitting: Adult Health

## 2019-10-27 ENCOUNTER — Encounter: Payer: Self-pay | Admitting: Adult Health

## 2019-10-27 VITALS — BP 138/89 | HR 63 | Temp 97.3°F | Wt 168.2 lb

## 2019-10-27 DIAGNOSIS — R0600 Dyspnea, unspecified: Secondary | ICD-10-CM | POA: Insufficient documentation

## 2019-10-27 DIAGNOSIS — R059 Cough, unspecified: Secondary | ICD-10-CM

## 2019-10-27 DIAGNOSIS — R05 Cough: Secondary | ICD-10-CM | POA: Insufficient documentation

## 2019-10-27 DIAGNOSIS — J90 Pleural effusion, not elsewhere classified: Secondary | ICD-10-CM | POA: Diagnosis not present

## 2019-10-27 DIAGNOSIS — R5383 Other fatigue: Secondary | ICD-10-CM

## 2019-10-27 DIAGNOSIS — R0609 Other forms of dyspnea: Secondary | ICD-10-CM

## 2019-10-27 DIAGNOSIS — R0989 Other specified symptoms and signs involving the circulatory and respiratory systems: Secondary | ICD-10-CM | POA: Insufficient documentation

## 2019-10-27 DIAGNOSIS — R0602 Shortness of breath: Secondary | ICD-10-CM | POA: Diagnosis not present

## 2019-10-27 DIAGNOSIS — J441 Chronic obstructive pulmonary disease with (acute) exacerbation: Secondary | ICD-10-CM

## 2019-10-27 DIAGNOSIS — J439 Emphysema, unspecified: Secondary | ICD-10-CM | POA: Diagnosis not present

## 2019-10-27 DIAGNOSIS — I5030 Unspecified diastolic (congestive) heart failure: Secondary | ICD-10-CM

## 2019-10-27 MED ORDER — FUROSEMIDE 40 MG PO TABS: 40.0000 mg | ORAL_TABLET | Freq: Every day | ORAL | 11 refills | Status: DC

## 2019-10-27 MED ORDER — IPRATROPIUM-ALBUTEROL 0.5-2.5 (3) MG/3ML IN SOLN: 3.0000 mL | RESPIRATORY_TRACT | 0 refills | Status: DC | PRN

## 2019-10-27 MED ORDER — BREZTRI AEROSPHERE 160-9-4.8 MCG/ACT IN AERO: 1.0000 | INHALATION_SPRAY | Freq: Two times a day (BID) | RESPIRATORY_TRACT | 2 refills | Status: DC

## 2019-10-27 MED ORDER — IPRATROPIUM-ALBUTEROL 0.5-2.5 (3) MG/3ML IN SOLN: 3.0000 mL | Freq: Once | RESPIRATORY_TRACT | Status: DC

## 2019-10-27 MED ORDER — DEXAMETHASONE 0.5 MG PO TABS
ORAL_TABLET | ORAL | 0 refills | Status: DC
Start: 2019-10-27 — End: 2019-11-29

## 2019-10-27 MED ORDER — LEVOFLOXACIN 750 MG PO TABS: 750.0000 mg | ORAL_TABLET | Freq: Every day | ORAL | 0 refills | Status: AC

## 2019-10-27 MED ORDER — BENZONATATE 200 MG PO CAPS: ORAL_CAPSULE | ORAL | 1 refills | Status: DC

## 2019-10-27 NOTE — Progress Notes (Signed)
Assessment and Plan:  [name] was seen today for acute visit.  Diagnoses and all orders for this visit:  Cough/Exertional dyspnea/Fatigue - improved with duoneb COPD with exacerbation (HCC) Unchanged EKG, O2 and lung sounds sig improved with duoneb Consider CHF with 8 lb weight gain will recheck CXR, check BNP, lasix given  Check labs  Most consistent with COPD flare, consider possibly bronchiectasis with mucus plugging per CT in 02/2019, consider follow up CT vs pulmonology evaluation with PFTs failed zpac 1 month ago, levaquin given, decadron taper Will have her restart breztri, do duoneb first and q4-6 hours PRN Monitor O2 closely - if not maintaining above 88% or sx any worse NEEDS TO GO TO ED She is retired Therapist, sports, discussed and 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 -     Budeson-Glycopyrrol-Formoterol (BREZTRI AEROSPHERE) 160-9-4.8 MCG/ACT AERO; Inhale 1 puff into the lungs 2 (two) times daily. -     ipratropium-albuterol (DUONEB) 0.5-2.5 (3) MG/3ML SOLN; Take 3 mLs by nebulization every 4 (four) hours as needed. Max:6 doses per day -     dexamethasone (DECADRON) 0.5 MG tablet; Take 1 tablet PO TID for 3 days, then take 1 tablet PO BID for 3 days, then take 1 tablet PO for 5 days. -     levofloxacin (LEVAQUIN) 750 MG tablet; Take 1 tablet (750 mg total) by mouth daily for 5 days.  Diastolic congestive heart failure, unspecified HF chronicity ([place]) Never followed up, recheck CXR for  signs of fluid overload, check BNP, after discussion will give her lasix 40 mg tabs to add due to weight gain, take 1/2-1 tab in AM, monitor BP and weights closely - recheck at follow up in 4 days  -     furosemide (LASIX) 40 MG tablet; Take 1 tablet (40 mg total) by mouth daily. -     Brain natriuretic peptide -     COMPLETE METABOLIC PANEL WITH GFR  Further disposition pending results of labs. Discussed med's effects and SE's.   Over 30 minutes of exam, counseling, chart review, and critical decision making was performed.   Future Appointments  Date Time Provider [place]  11/08/2019  9:30 AM Ladene Artist, MD [facility]  11/29/2019  9:30 AM Unk [name], MD [facility] None  12/06/2019  9:30 AM Josue Hector, MD [facility]  05/24/2020 11:00 AM Unk [name], MD [facility] None    ------------------------------------------------------------------------------------------------------------------   HPI BP 138/89    Pulse 63    Temp (!) 97.3 F (36.3 C)    Wt 168 lb 3.2 oz (76.3 kg)    SpO2 99%    BMI 30.27 kg/m   69 y.o.female former smoker with emphysema, hx of acute diastolic heart failure (never had follow up ECHO, not firmed as chronic) presents for evaluation due to persistent progressive severe cough. O2 initiatlly 88%, improved to  93-97% with coughing to clear, 99% following duoneb.   She presented for cough and was treated for COPD flare early June with zpak and prednisone taper, was given breztri samples and has albuterol inhaler/neb, promethazine DM and benzonatate, had negative CXR at that time. She did have negative covid 19 test and has had vaccines x 2. Denies known sick contacts. Reports none of the meds helped, has had severe progressive cough (minimally productive, rare occasional very thick/yellow secretions), some wheezing, shortness of breath (worse in the last 1-2 week) with exertion, going up and down the steps to get laundry has to  sit down which is unusual for her. Reports has had 2 x negative covid test since onset (rapid and PCR). Main concern is cough preventing her from resting.   She does take claritin for allergies, protonix for GERD and recently well controlled.  Has promethazine DM but not helping, didn't try benzonatate due to cost $60+ with insurance (advised goodrx cost $18 at [facility] which she finds reasonable, allergy to codeine, hydrocodone limiting options)  She has been sleeping propped up with 4 pillows, up from 2, started this 2 days ago, still not sleeping, very fatigued and hurting all over and in throat due to severe coughing. Denies HA, dizziness, syncope, nasal/sinus congestion, GI sx.   She has a history of Diastolic CHF, new/acute while hospitalized last year with sepsis, no problems since discharge. Was supposed to follow up with Dr. Johnsie Cancel for recheck but was deferring, has scheduled later this summer.  Denies paroxysmal nocturnal dyspnea and edema.  Positive for dyspnea, fatigue, irregular heart beat and orthopnea.  Wt Readings from Last 3 Encounters:  10/27/19 168 lb 3.2 oz (76.3 kg)  10/25/19 169 lb 3.2 oz (76.7 kg)  10/03/19 161 lb 3.2 oz (73.1 kg)    Lab Results  Component Value Date   GFRNONAA 43 (L) 08/30/2019    Last CXR 09/06/2019 1 week after onset of cough showed:   FINDINGS: Heart size within normal limits. Aortic atherosclerosis. Redemonstrated emphysematous changes. No appreciable airspace consolidation. No evidence of pleural effusion or pneumothorax. No acute bony abnormality identified. Thoracic spondylosis.  IMPRESSION: Emphysema. No evidence of acute cardiopulmonary abnormality.  CT 03/09/2019 while admission:   Lungs/Pleura: Lungs demonstrate upper lobe paraseptal and centrilobular emphysema. Paraseptal emphysema is also noted posteriorly in the lower lobes. There are areas of segmental bronchial wall thickening and mucous plugging, most evident in the lower lobes.  Mild heterogeneous peripheral interstitial thickening is noted bilaterally. There is also mild hazy increased attenuation in the upper lobes, that is likely due to air trapping from the low lung volumes. No convincing pneumonia. There is no evidence of pulmonary edema. No lung mass and no suspicious nodule. No pleural effusion or pneumothorax.   Past Medical History:  Diagnosis Date   ADHD (attention deficit hyperactivity disorder)    AKI (acute kidney injury) ([place]) 03/09/2019   Allergy    seasonal   Cataract    CHF (congestive heart failure) (HCC)    to be evaluated   Chronic kidney disease (CKD), stage III (moderate)    Depression    Diverticulosis    Emphysema lung (HCC)    Emphysema of lung (HCC)    no home oxygen   Encephalopathy, metabolic 9/73/5329   GERD (gastroesophageal reflux disease)    Helicobacter pylori (H. pylori)    Hyperlipidemia    Hypertension    IBS (irritable bowel syndrome)    LBBB (left bundle branch block)  Lower urinary tract infection 06/21/2017   Migraines    OCD (obsessive compulsive disorder)    Osteoporosis    osteopenia   Prediabetes    pcp   Renal disorder    AKI due to lisinpril, his. pyelonephritis   Sepsis ([place])    Sleep apnea    no CPAP   Stroke ([place])    TIAs   Thyroid disease    TIA (transient ischemic attack)    Vitamin D deficiency      Allergies  Allergen Reactions   Ace Inhibitors Other (See Comments)    Acute renal failure (lisinopril)   Cephalexin Anaphylaxis   Dilaudid [Hydromorphone Hcl] Anaphylaxis   Robaxin [Methocarbamol] Anaphylaxis   Macrobid [Nitrofurantoin] Other (See Comments)    Causes hyperthermia    Neurontin [Gabapentin]     Confusion    Codeine Nausea Only   Latex Rash   Sulfa Antibiotics Other (See Comments)    Do not take per nephrology    Current Outpatient Medications on File Prior to Visit  Medication Sig   albuterol (VENTOLIN HFA) 108 (90 Base)  MCG/ACT inhaler Inhale 1-2 puffs into the lungs every 4 (four) hours as needed for wheezing or shortness of breath.   ALPRAZolam (XANAX) 0.5 MG tablet TAKE 1/2 TO 1 TABLET BY MOUTH THREE TIMES A DAY IF NEEDED FOR ANXIETY OR SLEEP (Patient taking differently: Take 0.25-0.5 mg by mouth 3 (three) times daily as needed for anxiety or sleep. )   ascorbic acid (VITAMIN C) 250 MG tablet Take by mouth.   atenolol (TENORMIN) 100 MG tablet TAKE 1 TABLET BY MOUTH ONCE DAILY FOR BP   buPROPion (WELLBUTRIN XL) 150 MG 24 hr tablet Take 3 tablets (450 mg) every Morning for Mood & Depression   Cholecalciferol (VITAMIN D-3) 5000 UNITS TABS Take 5,000 Units by mouth daily.    docusate sodium (COLACE) 100 MG capsule Take 100 mg by mouth 2 (two) times daily as needed for mild constipation.   estradiol (ESTRACE) 0.1 MG/GM vaginal cream Place 1 Applicatorful vaginally at bedtime.   famotidine (PEPCID) 20 MG tablet Take 1 tablet 2 x  /day for Indigestion & Heartburn   hyoscyamine (LEVBID) 0.375 MG 12 hr tablet Take 1 tablet 2 x /day for IBS   hyoscyamine (LEVSIN SL) 0.125 MG SL tablet Dissolve 1 tablet under tongue every 4 hours if Needed for Nausea, Bloating, Cramping or Diarrhea   levothyroxine (SYNTHROID) 125 MCG tablet Take 1 tablet daily on an empty stomach with only water for 30 minutes & no Antacid meds, Calcium or Magnesium for 4 hours & avoid Biotin   Magnesium 250 MG TABS Take 250 mg by mouth daily.   MYRBETRIQ 25 MG TB24 tablet Take 25 mg by mouth daily.   ondansetron (ZOFRAN) 8 MG tablet TAKE 1 TABLET BY MOUTH 3 TIMES A DAY AS NEEDED FOR NAUSEA   OVER THE COUNTER MEDICATION OTC Vitamin C gummy 2 daily.   pantoprazole (PROTONIX) 40 MG tablet Take 1 tablet (40 mg total) by mouth daily.   promethazine-dextromethorphan (PROMETHAZINE-DM) 6.25-15 MG/5ML syrup Take 5 mLs by mouth 4 (four) times daily as needed for cough.   sertraline (ZOLOFT) 100 MG tablet Take 1 tablet Daily for Mood / Depression  (Patient taking differently: Take 100 mg by mouth daily. )   zinc gluconate 50 MG tablet Take 50 mg by mouth daily.   aspirin EC 81 MG tablet Take 81 mg by mouth daily.  (Patient not taking: Reported on 10/27/2019)  CRANBERRY PO Take 1 capsule by mouth daily.  (Patient not taking: Reported on 10/27/2019)   valACYclovir (VALTREX) 1000 MG tablet Take 1 tablet (1,000 mg total) by mouth 3 (three) times daily. Take for 7 days (Patient not taking: Reported on 10/27/2019)   No current facility-administered medications on file prior to visit.    ROS: all negative except above.   Physical Exam:  BP 138/89    Pulse 63    Temp (!) 97.3 F (36.3 C)    Wt 168 lb 3.2 oz (76.3 kg)    SpO2 99%    BMI 30.27 kg/m   General Appearance: Chronically ill appearing, pale adult female, good hygiene, coughing frequently but does not appear in acute distress.  Eyes: PERRLA, EOMs, conjunctiva no swelling or erythema Sinuses: No Frontal/maxillary tenderness ENT/Mouth: Ext aud canals clear, TMs without erythema, bulging. No erythema, swelling, or exudate on post pharynx.  Tonsils not swollen or erythematous. Hearing normal.  Neck: Supple, thyroid normal. No JVD.  Respiratory: Respiratory effort normal, no retractions or accessory muscle use. Initially with severe very coarse cough in fits.  BS initially with rales throughout, do clear some with cough, rhonchi at end exp in bil bases without wheezing or stridor. Rales cleared following duoneb, cough improved, still rhonchi in bases.  Cardio: Somewhat quiet RRR with no MRGs. Brisk peripheral pulses without edema.  Abdomen: Soft, non-distended, + BS.  Non tender, no masses. Lymphatics: Non tender without lymphadenopathy.  Musculoskeletal: No obvious deformity, normal gait.  Skin: Warm, dry, very pale appearing, healing erythematous rash (recent shingles) to left chest wall without active lesions.  Neuro: Normal muscle tone, no cerebellar symptoms.  Psych: Awake and  oriented X 3, normal affect, Insight and Judgment appropriate.     Izora Ribas, NP 12:28 PM [facility]

## 2019-10-27 NOTE — Patient Instructions (Signed)

## 2019-10-28 LAB — CBC WITH DIFFERENTIAL/PLATELET
Absolute Monocytes: 524 cells/uL (ref 200–950)
Basophils Absolute: 29 cells/uL (ref 0–200)
Basophils Relative: 0.5 %
Eosinophils Absolute: 485 cells/uL (ref 15–500)
Eosinophils Relative: 8.5 %
HCT: 38.8 % (ref 35.0–45.0)
Hemoglobin: 11.9 g/dL (ref 11.7–15.5)
Lymphs Abs: 1596 cells/uL (ref 850–3900)
MCH: 30.1 pg (ref 27.0–33.0)
MCHC: 30.7 g/dL — ABNORMAL LOW (ref 32.0–36.0)
MCV: 98 fL (ref 80.0–100.0)
MPV: 9.9 fL (ref 7.5–12.5)
Monocytes Relative: 9.2 %
Neutro Abs: 3067 cells/uL (ref 1500–7800)
Neutrophils Relative %: 53.8 %
Platelets: 362 10*3/uL (ref 140–400)
RBC: 3.96 10*6/uL (ref 3.80–5.10)
RDW: 13.5 % (ref 11.0–15.0)
Total Lymphocyte: 28 %
WBC: 5.7 10*3/uL (ref 3.8–10.8)

## 2019-10-28 LAB — COMPLETE METABOLIC PANEL WITH GFR
AG Ratio: 1 (calc) (ref 1.0–2.5)
ALT: 12 U/L (ref 6–29)
AST: 16 U/L (ref 10–35)
Albumin: 3.2 g/dL — ABNORMAL LOW (ref 3.6–5.1)
Alkaline phosphatase (APISO): 73 U/L (ref 37–153)
BUN/Creatinine Ratio: 19 (calc) (ref 6–22)
BUN: 24 mg/dL (ref 7–25)
CO2: 32 mmol/L (ref 20–32)
Calcium: 9.2 mg/dL (ref 8.6–10.4)
Chloride: 103 mmol/L (ref 98–110)
Creat: 1.24 mg/dL — ABNORMAL HIGH (ref 0.50–0.99)
GFR, Est African American: 52 mL/min/{1.73_m2} — ABNORMAL LOW (ref >=60)
GFR, Est Non African American: 45 mL/min/{1.73_m2} — ABNORMAL LOW (ref >=60)
Globulin: 3.1 g/dL (calc) (ref 1.9–3.7)
Glucose, Bld: 101 mg/dL — ABNORMAL HIGH (ref 65–99)
Potassium: 5 mmol/L (ref 3.5–5.3)
Sodium: 142 mmol/L (ref 135–146)
Total Bilirubin: 0.2 mg/dL (ref 0.2–1.2)
Total Protein: 6.3 g/dL (ref 6.1–8.1)

## 2019-10-28 LAB — BRAIN NATRIURETIC PEPTIDE: Brain Natriuretic Peptide: 207 pg/mL — ABNORMAL HIGH (ref <100)

## 2019-10-31 ENCOUNTER — Ambulatory Visit (INDEPENDENT_AMBULATORY_CARE_PROVIDER_SITE_OTHER): Payer: PPO | Admitting: Physician Assistant

## 2019-10-31 ENCOUNTER — Other Ambulatory Visit: Payer: Self-pay

## 2019-10-31 VITALS — BP 108/62 | HR 66 | Temp 96.7°F | Wt 157.0 lb

## 2019-10-31 DIAGNOSIS — R05 Cough: Secondary | ICD-10-CM | POA: Diagnosis not present

## 2019-10-31 DIAGNOSIS — N179 Acute kidney failure, unspecified: Secondary | ICD-10-CM | POA: Diagnosis not present

## 2019-10-31 DIAGNOSIS — R110 Nausea: Secondary | ICD-10-CM

## 2019-10-31 DIAGNOSIS — B029 Zoster without complications: Secondary | ICD-10-CM | POA: Diagnosis not present

## 2019-10-31 DIAGNOSIS — I77810 Thoracic aortic ectasia: Secondary | ICD-10-CM | POA: Diagnosis not present

## 2019-10-31 DIAGNOSIS — R042 Hemoptysis: Secondary | ICD-10-CM

## 2019-10-31 DIAGNOSIS — R059 Cough, unspecified: Secondary | ICD-10-CM

## 2019-10-31 MED ORDER — PREDNISONE 20 MG PO TABS: ORAL_TABLET | ORAL | 0 refills | Status: DC

## 2019-10-31 MED ORDER — PROMETHAZINE HCL 25 MG PO TABS: 25.0000 mg | ORAL_TABLET | Freq: Four times a day (QID) | ORAL | 3 refills | Status: DC | PRN

## 2019-10-31 MED ORDER — TRAMADOL HCL 50 MG PO TABS: 50.0000 mg | ORAL_TABLET | Freq: Four times a day (QID) | ORAL | 0 refills | Status: AC | PRN

## 2019-10-31 MED ORDER — LIDOCAINE 5 % EX OINT: 1.0000 "application " | TOPICAL_OINTMENT | CUTANEOUS | 0 refills | Status: DC | PRN

## 2019-10-31 MED ORDER — NYSTATIN 100000 UNIT/ML MT SUSP: OROMUCOSAL | 0 refills | Status: DC

## 2019-10-31 MED FILL — PROMETHAZINE 25 MG TABLET: 25 | 15 days supply | Qty: 60 | Fill #0

## 2019-10-31 MED FILL — traMADol HCL 50 MG TABS: 50 | 5 days supply | Qty: 20 | Fill #0

## 2019-10-31 MED FILL — NYSTATIN 100,000 UNITS/ML S: 100000 | 4 days supply | Qty: 80 | Fill #0

## 2019-10-31 NOTE — Progress Notes (Signed)
Assessment and Plan:  Nausea -     promethazine (PHENERGAN) 25 MG tablet; Take 1 tablet (25 mg total) by mouth every 6 (six) hours as needed for nausea or vomiting.  Cough -     QuantiFERON-TB Gold Plus -     traMADol (ULTRAM) 50 MG tablet; Take 1 tablet (50 mg total) by mouth 4 (four) times daily as needed for up to 5 days for moderate pain. -     CBC with Differential/Platelet -     COMPLETE METABOLIC PANEL WITH GFR -     predniSONE (DELTASONE) 20 MG tablet; 2 tablets daily for 3 days, 1 tablet daily for 4 days. -     CT Chest Wo Contrast; Future  Acute pain associated with herpes zoster -     lidocaine (XYLOCAINE) 5 % ointment; Apply 1 application topically as needed. -     traMADol (ULTRAM) 50 MG tablet; Take 1 tablet (50 mg total) by mouth 4 (four) times daily as needed for up to 5 days for moderate pain. - has had old vaccine  Hemoptysis -     QuantiFERON-TB Gold Plus -     CT Chest Wo Contrast; Future - ? ILD- has had mucus plugging and some intersitial thickening on last CT chest.  ? From throat irritation/cough  Acute renal failure, unspecified acute renal failure type ([place]) STOP LASIX, WILL SWITCH CT WITH CONTRAST TO WITHOUT AVOID NSAIDS CLOSE FOLLOW UP  Further disposition pending results of labs. Discussed med's effects and SE's.   Over 30 minutes of exam, counseling, chart review, and critical decision making was performed.   Future Appointments  Date Time Provider [place]  11/08/2019  9:30 AM Ladene Artist, MD [facility]  11/29/2019  9:30 AM Unk [name], MD [facility] None  12/06/2019  9:30 AM Josue Hector, MD [facility]  05/24/2020 11:00 AM Unk [name], MD [facility] None    ------------------------------------------------------------------------------------------------------------------   HPI BP 108/62   Pulse 66   Temp (!) 96.7 F (35.9 C)   Wt 157 lb (71.2 kg)   SpO2 96%   BMI 28.26 kg/m   69 y.o.female  former smoker with emphysema, hx of acute diastolic heart failure during a hospitlization presents for evaluation for cough.   Seen in the office on 07/02, here for close follow up.   She was treated for COPD flare in June with zpak and prednisone.  She is up to date on her COVID vaccines and has no sick contacts.   She responded well to nebulizer in the office and has nebulizer at home.  She had a normal EKG, her weight was up 8 lbs and her BNP was 200, she was placed on lasix 40 mg for possible diastolic heart failure component. She is down 11 lbs from last time.   Her CXR showed no pneumonia, no new masses and no vascular congestion- she was given levaquin, decadron and breztri and is here for follow up. She could not tolerated the decadron due to nausea and horrible GERD, she took it 1 day only. Has not been on protonix. She has had GERD.  She has been doing the breztri but only once a day. She has been doing albuterol and ipratropium twice a day.   She states she coughed up a little bit of blood but feels it could be from her throat. Will get some mucus blood with bright red blood.  No night sweats, some chills.   She had shingles on  06/08, states the pain is getting worse, left breast pain and back. Feels like something is cutting her for that.  Neurontin made her "crazy"  Her CXR on 07/02 showed  IMPRESSION: 1. Emphysema, hyperinflation and chronic bronchitic features with coarsened interstitium similar to prior. 2. No acute cardiopulmonary process.  Wt Readings from Last 3 Encounters:  10/31/19 157 lb (71.2 kg)  10/27/19 168 lb 3.2 oz (76.3 kg)  10/25/19 169 lb 3.2 oz (76.7 kg)   She has a history of Diastolic CHF, new/acute while hospitalized last year with sepsis. Has follow up with Dr. Johnsie Cancel.  Denies paroxysmal nocturnal dyspnea and edema.  Positive for dyspnea, fatigue, irregular heart beat and orthopnea.  Wt Readings from Last 3 Encounters:  10/31/19 157 lb (71.2 kg)   10/27/19 168 lb 3.2 oz (76.3 kg)  10/25/19 169 lb 3.2 oz (76.7 kg)    Lab Results  Component Value Date   GFRNONAA 45 (L) 10/27/2019    CT 03/09/2019 while admission:   Lungs/Pleura: Lungs demonstrate upper lobe paraseptal and centrilobular emphysema. Paraseptal emphysema is also noted posteriorly in the lower lobes. There are areas of segmental bronchial wall thickening and mucous plugging, most evident in the lower lobes. Mild heterogeneous peripheral interstitial thickening is noted bilaterally. There is also mild hazy increased attenuation in the upper lobes, that is likely due to air trapping from the low lung volumes. No convincing pneumonia. There is no evidence of pulmonary edema. No lung mass and no suspicious nodule. No pleural effusion or pneumothorax.   Past Medical History:  Diagnosis Date  . ADHD (attention deficit hyperactivity disorder)   . AKI (acute kidney injury) ([place]) 03/09/2019  . Allergy    seasonal  . Cataract   . CHF (congestive heart failure) ([place])    to be evaluated  . Chronic kidney disease (CKD), stage III (moderate)   . Depression   . Diverticulosis   . Emphysema lung ([place])   . Emphysema of lung ([place])    no home oxygen  . Encephalopathy, metabolic 7/51/0258  . GERD (gastroesophageal reflux disease)   . Helicobacter pylori (H. pylori)   . Hyperlipidemia   . Hypertension   . IBS (irritable bowel syndrome)   . LBBB (left bundle branch block)   . Lower urinary tract infection 06/21/2017  . Migraines   . OCD (obsessive compulsive disorder)   . Osteoporosis    osteopenia  . Prediabetes    pcp  . Renal disorder    AKI due to lisinpril, his. pyelonephritis  . Sepsis ([place])   . Sleep apnea    no CPAP  . Stroke ([place])    TIAs  . Thyroid disease   . TIA (transient ischemic attack)   . Vitamin D deficiency      Allergies  Allergen Reactions  . Ace Inhibitors Other (See Comments)    Acute renal failure (lisinopril)  . Cephalexin  Anaphylaxis  . Dilaudid [Hydromorphone Hcl] Anaphylaxis  . Robaxin [Methocarbamol] Anaphylaxis  . Macrobid [Nitrofurantoin] Other (See Comments)    Causes hyperthermia   . Neurontin [Gabapentin]     Confusion   . Codeine Nausea Only  . Latex Rash  . Sulfa Antibiotics Other (See Comments)    Do not take per nephrology     Current Outpatient Medications (Endocrine & Metabolic):  .  dexamethasone (DECADRON) 0.5 MG tablet, Take 1 tablet PO TID for 3 days, then take 1 tablet PO BID for 3 days, then take 1 tablet PO for 5  days. .  levothyroxine (SYNTHROID) 125 MCG tablet, Take 1 tablet daily on an empty stomach with only water for 30 minutes & no Antacid meds, Calcium or Magnesium for 4 hours & avoid Biotin   Current Outpatient Medications (Cardiovascular):  .  atenolol (TENORMIN) 100 MG tablet, TAKE 1 TABLET BY MOUTH ONCE DAILY FOR BP .  furosemide (LASIX) 40 MG tablet, Take 1 tablet (40 mg total) by mouth daily.   Current Outpatient Medications (Respiratory):  .  albuterol (VENTOLIN HFA) 108 (90 Base) MCG/ACT inhaler, Inhale 1-2 puffs into the lungs every 4 (four) hours as needed for wheezing or shortness of breath. .  benzonatate (TESSALON) 200 MG capsule, Take 1 cap three times a day as needed for cough. .  Budeson-Glycopyrrol-Formoterol (BREZTRI AEROSPHERE) 160-9-4.8 MCG/ACT AERO, Inhale 1 puff into the lungs 2 (two) times daily. Marland Kitchen  ipratropium-albuterol (DUONEB) 0.5-2.5 (3) MG/3ML SOLN, Take 3 mLs by nebulization every 4 (four) hours as needed. Max:6 doses per day .  promethazine-dextromethorphan (PROMETHAZINE-DM) 6.25-15 MG/5ML syrup, Take 5 mLs by mouth 4 (four) times daily as needed for cough.  Current Facility-Administered Medications (Respiratory):  .  ipratropium-albuterol (DUONEB) 0.5-2.5 (3) MG/3ML nebulizer solution 3 mL  Current Outpatient Medications (Analgesics):  .  aspirin EC 81 MG tablet, Take 81 mg by mouth daily.  (Patient not taking: Reported on  10/27/2019)     Current Outpatient Medications (Other):  Marland Kitchen  ALPRAZolam (XANAX) 0.5 MG tablet, TAKE 1/2 TO 1 TABLET BY MOUTH THREE TIMES A DAY IF NEEDED FOR ANXIETY OR SLEEP (Patient taking differently: Take 0.25-0.5 mg by mouth 3 (three) times daily as needed for anxiety or sleep. ) .  ascorbic acid (VITAMIN C) 250 MG tablet, Take by mouth. Marland Kitchen  buPROPion (WELLBUTRIN XL) 150 MG 24 hr tablet, Take 3 tablets (450 mg) every Morning for Mood & Depression .  Cholecalciferol (VITAMIN D-3) 5000 UNITS TABS, Take 5,000 Units by mouth daily.  Marland Kitchen  docusate sodium (COLACE) 100 MG capsule, Take 100 mg by mouth 2 (two) times daily as needed for mild constipation. Marland Kitchen  estradiol (ESTRACE) 0.1 MG/GM vaginal cream, Place 1 Applicatorful vaginally at bedtime. .  famotidine (PEPCID) 20 MG tablet, Take 1 tablet 2 x  /day for Indigestion & Heartburn .  hyoscyamine (LEVBID) 0.375 MG 12 hr tablet, Take 1 tablet 2 x /day for IBS .  hyoscyamine (LEVSIN SL) 0.125 MG SL tablet, Dissolve 1 tablet under tongue every 4 hours if Needed for Nausea, Bloating, Cramping or Diarrhea .  levofloxacin (LEVAQUIN) 750 MG tablet, Take 1 tablet (750 mg total) by mouth daily for 5 days. .  Magnesium 250 MG TABS, Take 250 mg by mouth daily. Marland Kitchen  MYRBETRIQ 25 MG TB24 tablet, Take 25 mg by mouth daily. .  ondansetron (ZOFRAN) 8 MG tablet, TAKE 1 TABLET BY MOUTH 3 TIMES A DAY AS NEEDED FOR NAUSEA .  OVER THE COUNTER MEDICATION, OTC Vitamin C gummy 2 daily. .  pantoprazole (PROTONIX) 40 MG tablet, Take 1 tablet (40 mg total) by mouth daily. .  sertraline (ZOLOFT) 100 MG tablet, Take 1 tablet Daily for Mood / Depression (Patient taking differently: Take 100 mg by mouth daily. ) .  zinc gluconate 50 MG tablet, Take 50 mg by mouth daily. Marland Kitchen  CRANBERRY PO, Take 1 capsule by mouth daily.  (Patient not taking: Reported on 10/27/2019) .  valACYclovir (VALTREX) 1000 MG tablet, Take 1 tablet (1,000 mg total) by mouth 3 (three) times daily. Take for 7 days  (Patient  not taking: Reported on 10/27/2019)   ROS: all negative except above.   Physical Exam:  BP 108/62   Pulse 66   Temp (!) 96.7 F (35.9 C)   Wt 157 lb (71.2 kg)   SpO2 96%   BMI 28.26 kg/m   General Appearance: Chronically ill appearing, adult female, good hygiene, coughing frequently but does not appear in acute distress.  Eyes: PERRLA, EOMs, conjunctiva no swelling or erythema Sinuses: No Frontal/maxillary tenderness ENT/Mouth: Ext aud canals clear, TMs without erythema, bulging. No erythema, swelling, or exudate on post pharynx.  Tonsils not swollen or erythematous.  Neck: Supple, thyroid normal. No JVD.  Respiratory: Respiratory effort normal, no retractions or accessory muscle use. BS equal with possible rhonchi at end RLL without wheezing or stridor.  Cardio: distant RRR with no MRGs. Brisk peripheral pulses without edema.  Abdomen: Soft, non-distended, + BS.  Non tender, no masses. Lymphatics: Non tender without lymphadenopathy.  Musculoskeletal: No obvious deformity, normal gait.  Skin: Warm, dry, very pale appearing, healing erythematous rash (recent shingles) to left chest wall and left back without active lesions.  Neuro: Normal muscle tone, no cerebellar symptoms.  Psych: Awake and oriented X 3, normal affect, Insight and Judgment appropriate.     Vicie Mutters, PA-C 4:08 PM [facility]

## 2019-10-31 NOTE — Patient Instructions (Addendum)
INFORMATION ABOUT YOUR STEROID INHALER  Can do steroid inhaler, NEED TO DO DAILY, this is NOT a rescue inhaler so if you are acutely short of breath please use your albuterol or call 911.  Do 1 puff TWICE a day.  Do before you brush your teeth OR wash your mouth afterwards.  IF YOU DO NOT [place] YOUR MOUTH OUT IT CAN CAUSE YEAST Can do 2 tsp vinegar with water and switch to help prevent yeast or help yeast in your mouth.   Go to the ER if any chest pain, shortness of breath, nausea, dizziness, severe HA, changes vision/speech   GET ON PANTOPRAZOLE/PROTONIX and PEPCID AT NIGHT - START ON PREDNISONE AFTER YOU HAVE BEEN ON THE PROTONIX/PEPCID X 3-5 DAYS  CAN DO NYSTATIN WASH 3-4 TIMES A DAY, SWISH AND SPIT.   WILL GET TB TEST AND REPEAT SOME LABS  WILL SCHEDEULE A CT LUNG  Make sure you are on an allergy pill. Please take the prednisone as directed below, this is NOT an antibiotic so you do NOT have to finish it. You can take it for a few days and stop it if you are doing better.   Please take the prednisone to help decrease inflammation and therefore decrease symptoms. Take it it with food to avoid GI upset. It can cause increased energy but on the other hand it can make it hard to sleep at night so please take it AT [place], it takes 8-12 hours to start working so it will NOT affect your sleeping if you take it at night with your food!!  If you are diabetic it will increase your sugars so decrease carbs and monitor your sugars closely.       MAXIMUM AMOUNT OF TYLENOL IN A DAY  You can take tylenol (500mg ) or tylenol arthritis (650mg ) with the meloxicam/antiinflammatories. The max you can take of tylenol a day is 3000mg  daily, this is a max of 6 pills a day of the regular tyelnol (500mg ) or a max of 4 a day of the tylenol arthritis (650mg ) as long as no other medications you are taking contain tylenol.

## 2019-11-01 ENCOUNTER — Other Ambulatory Visit: Payer: Self-pay | Admitting: *Deleted

## 2019-11-01 ENCOUNTER — Telehealth: Payer: Self-pay | Admitting: *Deleted

## 2019-11-01 MED ORDER — PANTOPRAZOLE SODIUM 40 MG PO TBEC: 40.0000 mg | DELAYED_RELEASE_TABLET | Freq: Every day | ORAL | 1 refills | Status: DC

## 2019-11-01 NOTE — Telephone Encounter (Signed)
Patient inquired about continuing her Furosemide. She viewed her labs on MyChart and was concerned about her kidney functions.

## 2019-11-02 ENCOUNTER — Ambulatory Visit
Admission: RE | Admit: 2019-11-02 | Discharge: 2019-11-02 | Disposition: A | Discharge status: Home / Self Care | Payer: PPO | Source: Ambulatory Visit | Attending: Physician Assistant | Admitting: Physician Assistant

## 2019-11-02 ENCOUNTER — Other Ambulatory Visit: Payer: PPO

## 2019-11-02 DIAGNOSIS — J439 Emphysema, unspecified: Secondary | ICD-10-CM | POA: Diagnosis not present

## 2019-11-02 DIAGNOSIS — R042 Hemoptysis: Secondary | ICD-10-CM

## 2019-11-02 DIAGNOSIS — R059 Cough, unspecified: Secondary | ICD-10-CM

## 2019-11-02 LAB — CBC WITH DIFFERENTIAL/PLATELET
Absolute Monocytes: 739 cells/uL (ref 200–950)
Basophils Absolute: 42 cells/uL (ref 0–200)
Basophils Relative: 0.5 %
Eosinophils Absolute: 193 cells/uL (ref 15–500)
Eosinophils Relative: 2.3 %
HCT: 42 % (ref 35.0–45.0)
Hemoglobin: 14 g/dL (ref 11.7–15.5)
Lymphs Abs: 1730 cells/uL (ref 850–3900)
MCH: 31.1 pg (ref 27.0–33.0)
MCHC: 33.3 g/dL (ref 32.0–36.0)
MCV: 93.3 fL (ref 80.0–100.0)
MPV: 9.8 fL (ref 7.5–12.5)
Monocytes Relative: 8.8 %
Neutro Abs: 5695 cells/uL (ref 1500–7800)
Neutrophils Relative %: 67.8 %
Platelets: 411 10*3/uL — ABNORMAL HIGH (ref 140–400)
RBC: 4.5 10*6/uL (ref 3.80–5.10)
RDW: 14.5 % (ref 11.0–15.0)
Total Lymphocyte: 20.6 %
WBC: 8.4 10*3/uL (ref 3.8–10.8)

## 2019-11-02 LAB — COMPLETE METABOLIC PANEL WITH GFR
AG Ratio: 1.1 (calc) (ref 1.0–2.5)
ALT: 12 U/L (ref 6–29)
AST: 16 U/L (ref 10–35)
Albumin: 3.7 g/dL (ref 3.6–5.1)
Alkaline phosphatase (APISO): 77 U/L (ref 37–153)
BUN/Creatinine Ratio: 19 (calc) (ref 6–22)
BUN: 43 mg/dL — ABNORMAL HIGH (ref 7–25)
CO2: 31 mmol/L (ref 20–32)
Calcium: 9.7 mg/dL (ref 8.6–10.4)
Chloride: 96 mmol/L — ABNORMAL LOW (ref 98–110)
Creat: 2.3 mg/dL — ABNORMAL HIGH (ref 0.50–0.99)
GFR, Est African American: 24 mL/min/{1.73_m2} — ABNORMAL LOW (ref >=60)
GFR, Est Non African American: 21 mL/min/{1.73_m2} — ABNORMAL LOW (ref >=60)
Globulin: 3.3 g/dL (calc) (ref 1.9–3.7)
Glucose, Bld: 110 mg/dL — ABNORMAL HIGH (ref 65–99)
Potassium: 4.1 mmol/L (ref 3.5–5.3)
Sodium: 137 mmol/L (ref 135–146)
Total Bilirubin: 0.4 mg/dL (ref 0.2–1.2)
Total Protein: 7 g/dL (ref 6.1–8.1)

## 2019-11-02 LAB — QUANTIFERON-TB GOLD PLUS
Mitogen-NIL: 8.95 IU/mL
NIL: 0.02 IU/mL
QuantiFERON-TB Gold Plus: NEGATIVE
TB1-NIL: 0 IU/mL
TB2-NIL: 0.01 IU/mL

## 2019-11-03 ENCOUNTER — Other Ambulatory Visit: Payer: Self-pay

## 2019-11-03 ENCOUNTER — Ambulatory Visit (INDEPENDENT_AMBULATORY_CARE_PROVIDER_SITE_OTHER): Payer: PPO

## 2019-11-03 VITALS — BP 98/58 | HR 68 | Temp 96.3°F | Wt 161.2 lb

## 2019-11-03 DIAGNOSIS — Z79899 Other long term (current) drug therapy: Secondary | ICD-10-CM

## 2019-11-03 DIAGNOSIS — I77810 Thoracic aortic ectasia: Secondary | ICD-10-CM | POA: Insufficient documentation

## 2019-11-03 HISTORY — DX: Thoracic aortic ectasia: I77.810

## 2019-11-03 LAB — COMPLETE METABOLIC PANEL WITH GFR
AG Ratio: 1.2 (calc) (ref 1.0–2.5)
ALT: 11 U/L (ref 6–29)
AST: 14 U/L (ref 10–35)
Albumin: 3.7 g/dL (ref 3.6–5.1)
Alkaline phosphatase (APISO): 71 U/L (ref 37–153)
BUN/Creatinine Ratio: 28 (calc) — ABNORMAL HIGH (ref 6–22)
BUN: 48 mg/dL — ABNORMAL HIGH (ref 7–25)
CO2: 28 mmol/L (ref 20–32)
Calcium: 9.6 mg/dL (ref 8.6–10.4)
Chloride: 105 mmol/L (ref 98–110)
Creat: 1.7 mg/dL — ABNORMAL HIGH (ref 0.50–0.99)
GFR, Est African American: 35 mL/min/{1.73_m2} — ABNORMAL LOW (ref >=60)
GFR, Est Non African American: 30 mL/min/{1.73_m2} — ABNORMAL LOW (ref >=60)
Globulin: 3 g/dL (calc) (ref 1.9–3.7)
Glucose, Bld: 116 mg/dL — ABNORMAL HIGH (ref 65–99)
Potassium: 4.2 mmol/L (ref 3.5–5.3)
Sodium: 141 mmol/L (ref 135–146)
Total Bilirubin: 0.3 mg/dL (ref 0.2–1.2)
Total Protein: 6.7 g/dL (ref 6.1–8.1)

## 2019-11-03 NOTE — Addendum Note (Signed)
Addended by: Vicie Mutters R on: 11/03/2019 12:48 PM   Modules accepted: Orders

## 2019-11-03 NOTE — Progress Notes (Signed)
Patient presents to the office for a nurse visit to have a Complete Metabolic Panel w/GFR done. Vitals taken and recorded.

## 2019-11-08 ENCOUNTER — Encounter: Payer: PPO | Admitting: Gastroenterology

## 2019-11-17 ENCOUNTER — Other Ambulatory Visit: Payer: Self-pay | Admitting: Internal Medicine

## 2019-11-17 MED FILL — SERTRALINE HCL 100 MG TABS: 100 | 90 days supply | Qty: 90 | Fill #0

## 2019-11-19 NOTE — Addendum Note (Signed)
Addended by: Vicie Mutters R on: 11/19/2019 12:23 PM   Modules accepted: Orders

## 2019-11-22 IMAGING — CT CT NECK W/O CM
4 of 5 series · 14 of 33 positions shown, 16 images · non-contrast
Comparison: None.

CLINICAL DATA: Facial swelling, vomiting, pain, trouble swallowing

EXAM:
CT NECK WITHOUT CONTRAST
TECHNIQUE: Multidetector CT imaging of the neck was performed following the
standard protocol without intravenous contrast.

[Series 6: neck 2.0 st · sagittal · 0.31mm/px · 5 of 101 slices shown, 6 images (1 of 3)]
[im 34/101  bone]
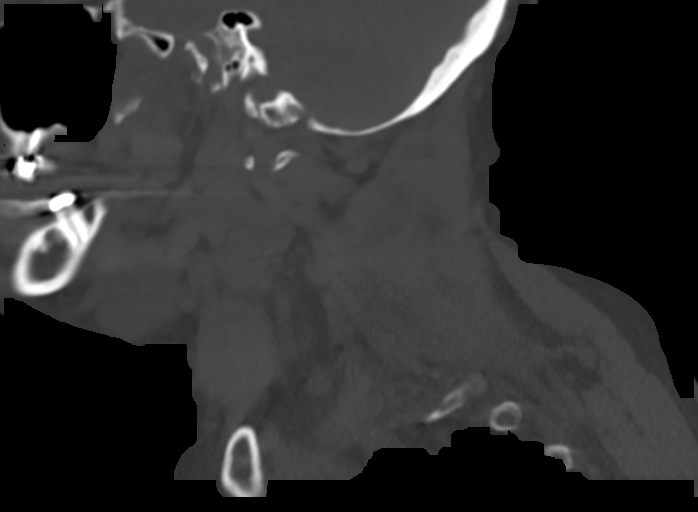
[im 42/101  bone]
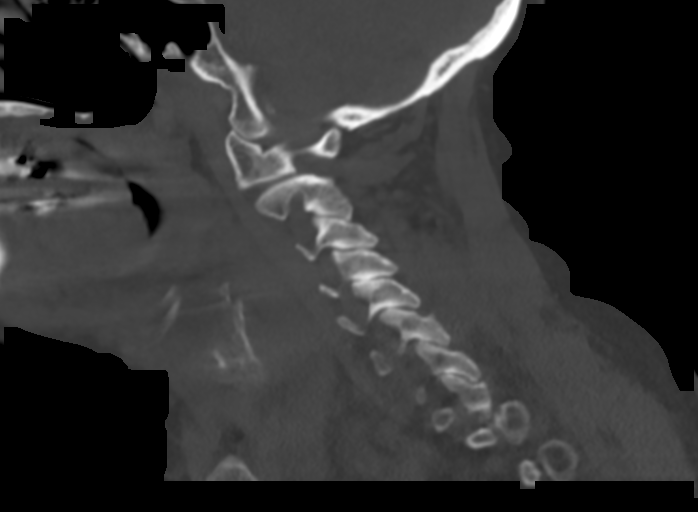
[im 51/101  soft-tissue]
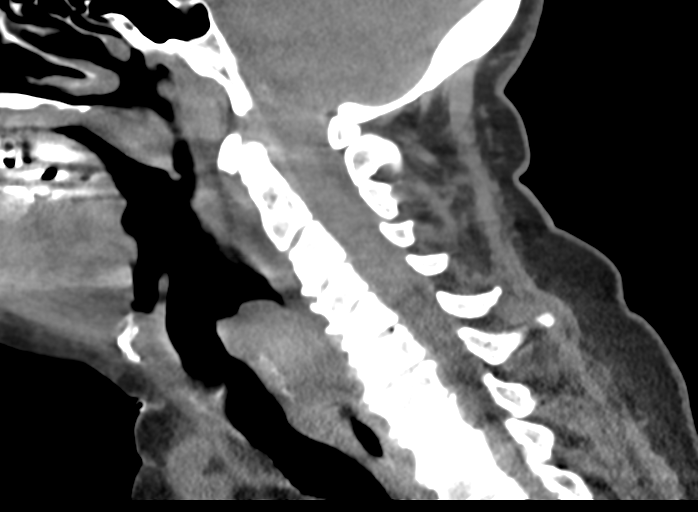
[im 51/101  bone]
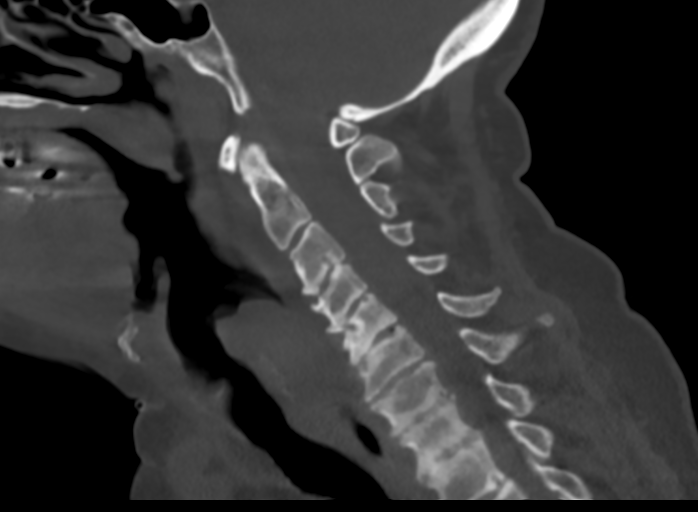
[im 59/101  bone]
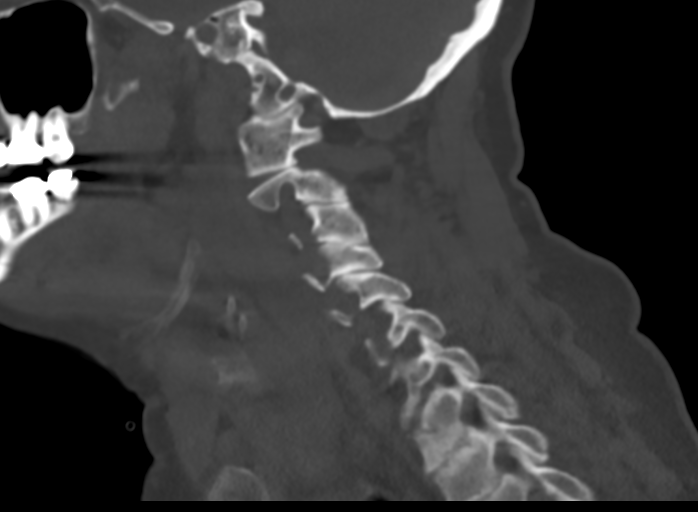
[im 67/101  bone]
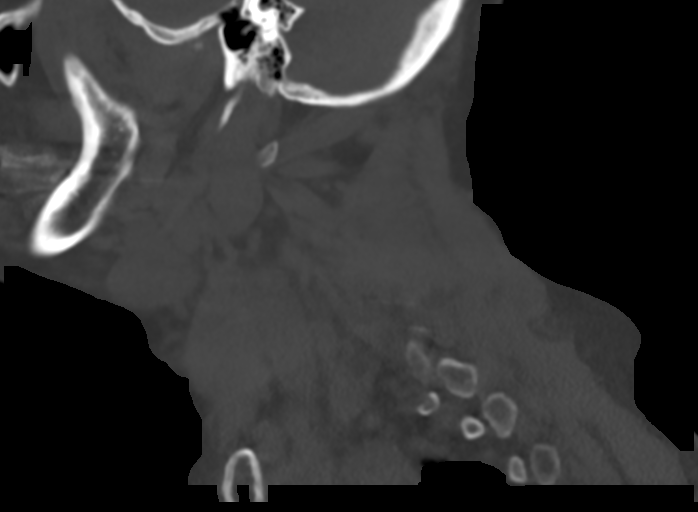

[Series 7: neck 2.0 st · coronal · 0.32mm/px · 3 of 101 slices shown (2 of 3)]
[im 21/101  bone]
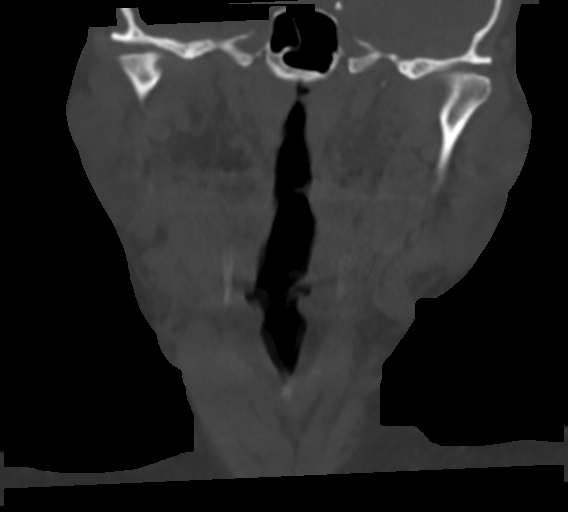
[im 41/101  bone]
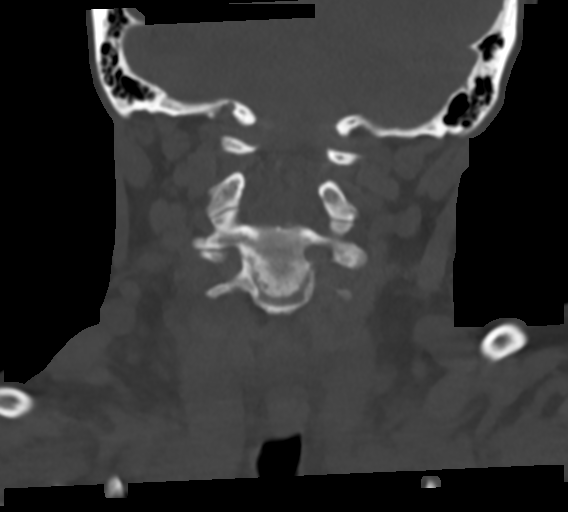
[im 61/101  bone]
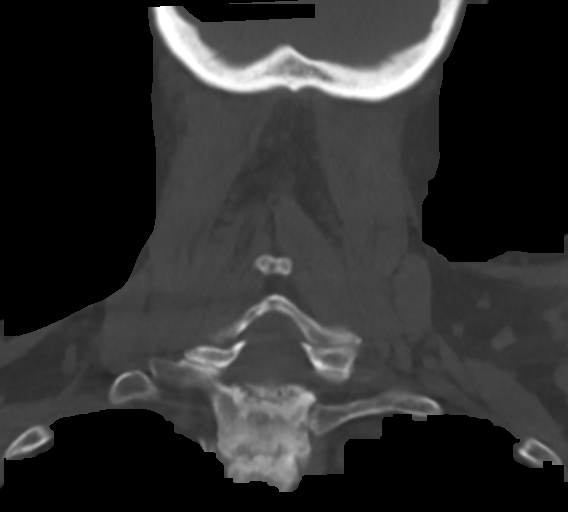

[Series 8: neck 2.0 st orthogonal · axial · 0.39mm/px · z∈[-303,-225]mm · 3 of 79 slices shown, 4 images]
[im 20/79  soft-tissue]
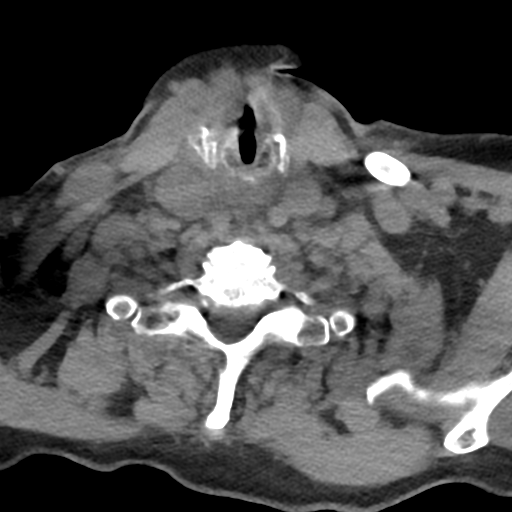
[im 20/79  bone]
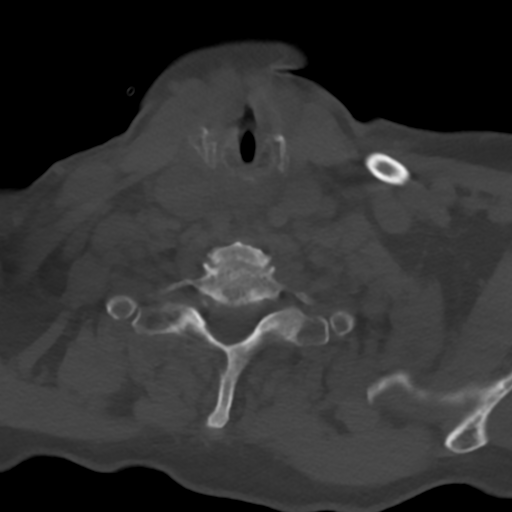
[im 40/79  bone]
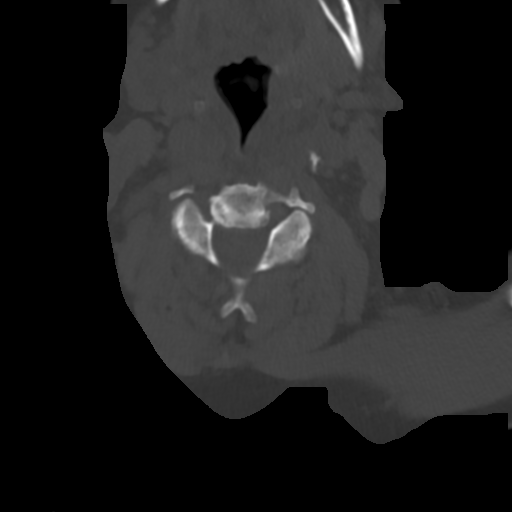
[im 59/79  bone]
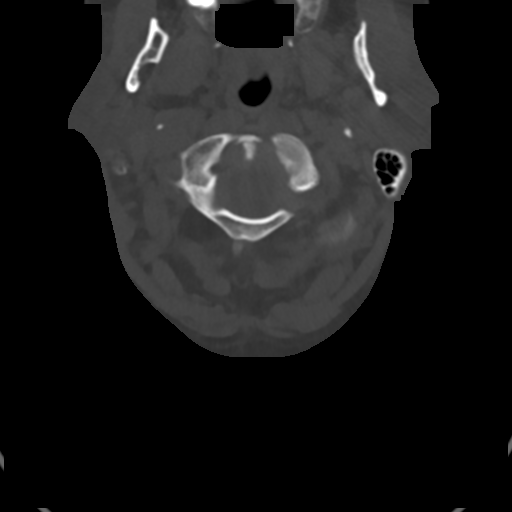

[Series 9: neck 2.0 st · axial · 0.51mm/px · z∈[-306,-228]mm · 3 of 79 slices shown (3 of 3)]
[im 20/79  bone]
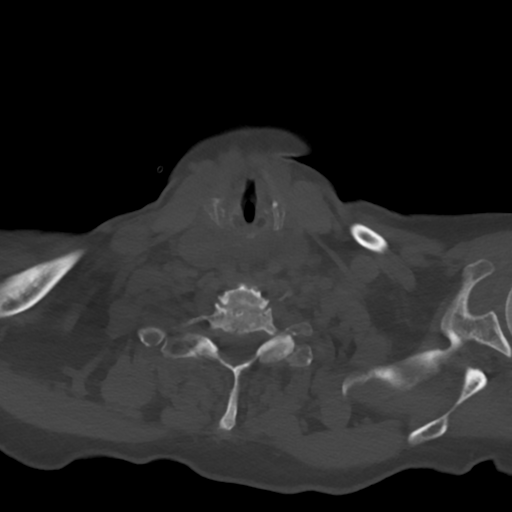
[im 40/79  bone]
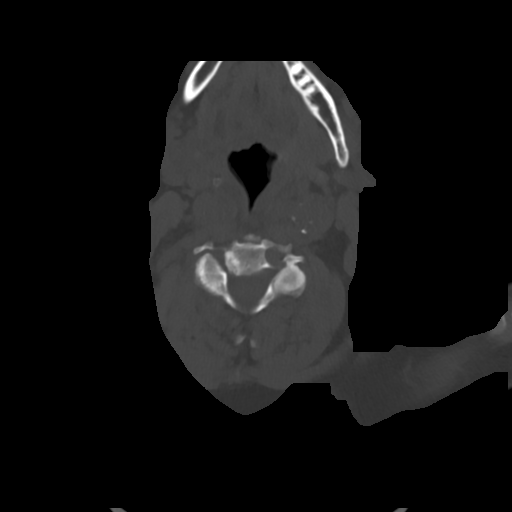
[im 59/79  bone]
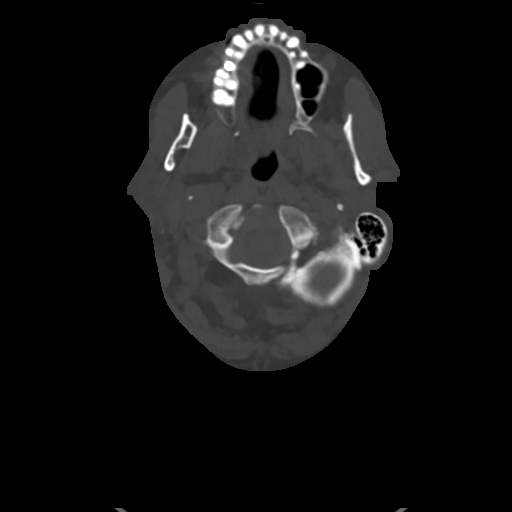

[14 of 33 positions shown; findings below may reference images not displayed]

FINDINGS: Motion and streak artifact are present.

Pharynx and larynx: Unremarkable within the above limitation.

Salivary glands: Submandibular and parotid glands are unremarkable.

Thyroid: Small.  Not well evaluated.

Lymph nodes: No pathologically enlarged lymph nodes.

Vascular: Calcified plaque at the common carotid bifurcations.

Limited intracranial: No acute or unexpected abnormality.

Visualized orbits: Minimally imaged.

Mastoids and visualized paranasal sinuses: Aerated.

Skeleton: Degenerative changes of the cervical spine.

Upper chest: Better evaluated on concurrent dedicated chest imaging.

Other: None.
IMPRESSION: No mass or adenopathy.  Suboptimal evaluation due to artifact

## 2019-11-22 IMAGING — CT CT CHEST W/O CM
2 of 3 series · 15 of 36 positions shown, 18 images · non-contrast
Comparison: Chest CT, 08/30/2003.  Current chest radiograph.

CLINICAL DATA: Surgery 2 days ago for breast implant removal. Last
night with vomiting and face swelling. Pain everywhere. Patient also
with shortness of breath and trouble swallowing.

EXAM:
CT CHEST WITHOUT CONTRAST
TECHNIQUE: Multidetector CT imaging of the chest was performed following the
standard protocol without IV contrast.

[Series 4: chest w/o 2mm st · axial · non-contrast · 0.68mm/px · z∈[-572,-316]mm · 12 of 152 slices shown, 15 images]
[im 12/152  mediastinal]
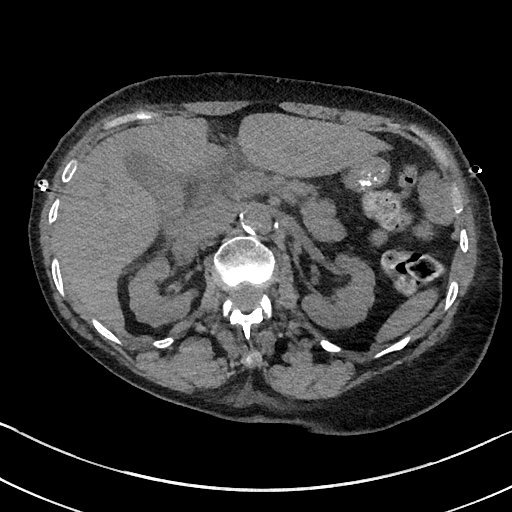
[im 12/152  lung]
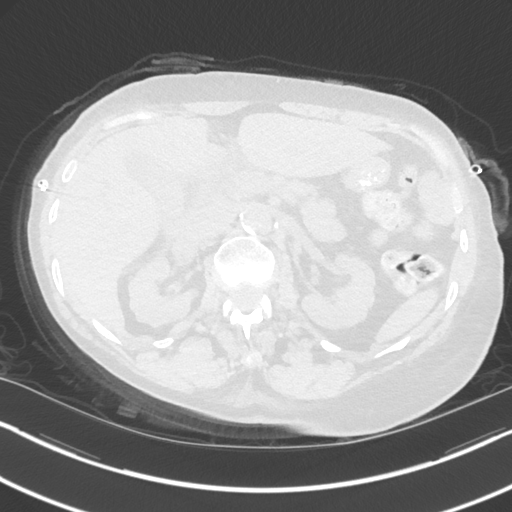
[im 23/152  lung]
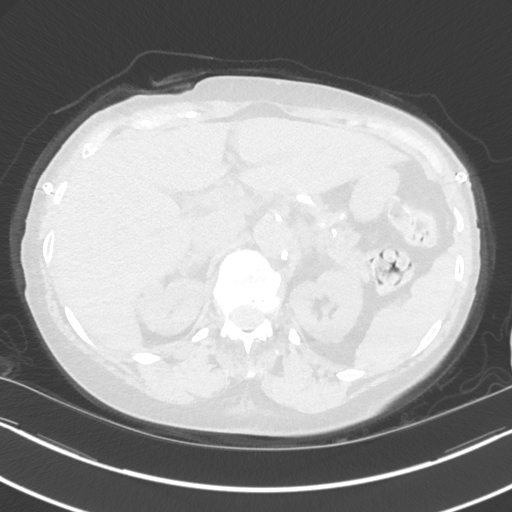
[im 34/152  lung]
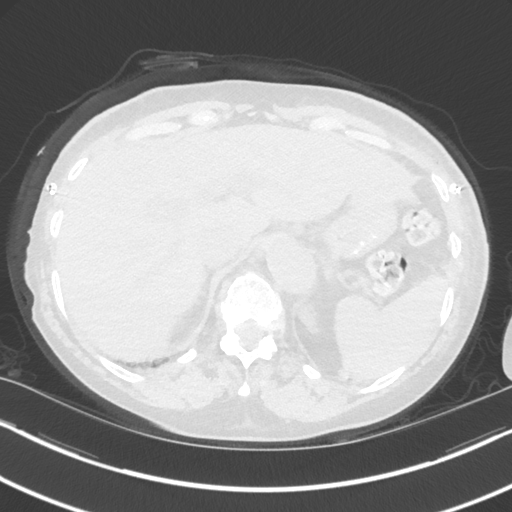
[im 45/152  lung]
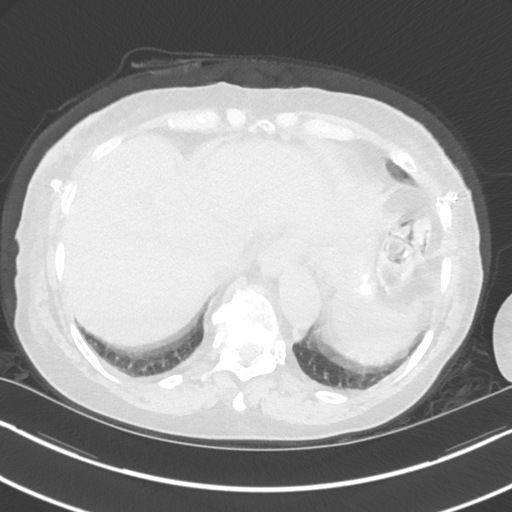
[im 56/152  mediastinal]
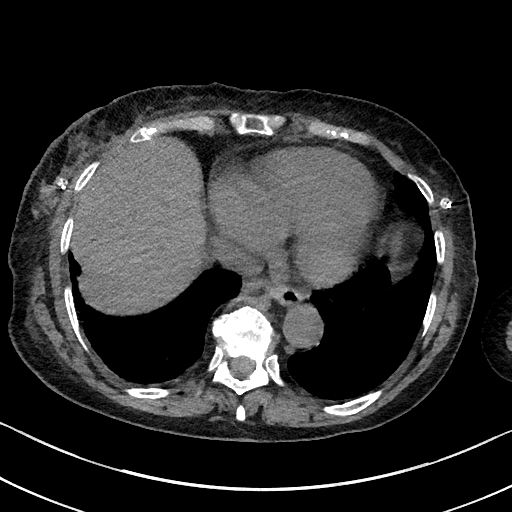
[im 56/152  lung]
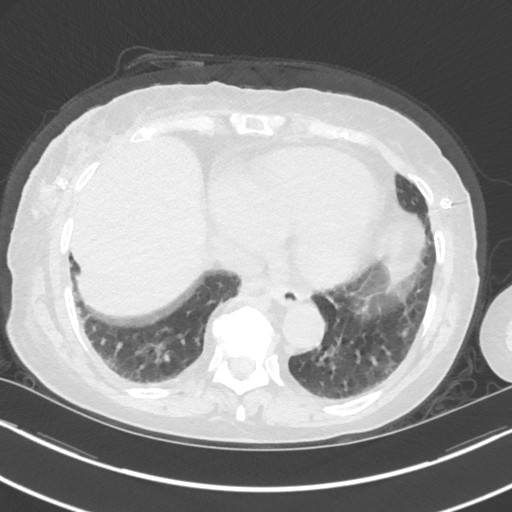
[im 68/152  lung]
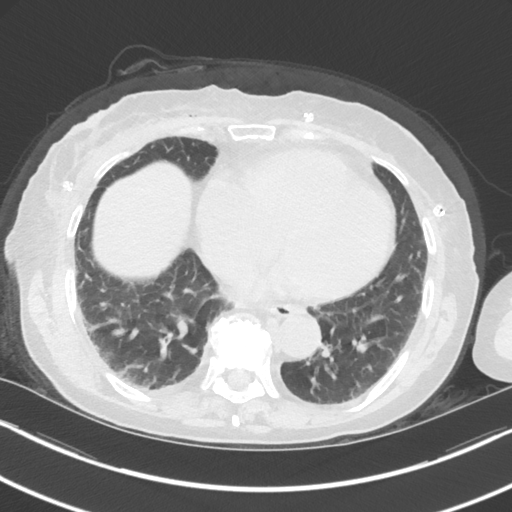
[im 84/152  lung]
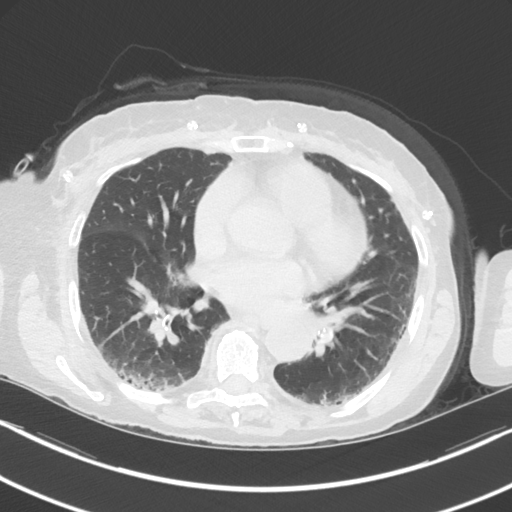
[im 96/152  lung]
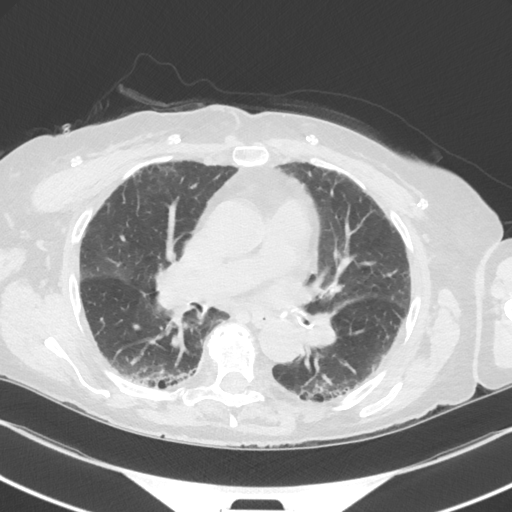
[im 107/152  mediastinal]
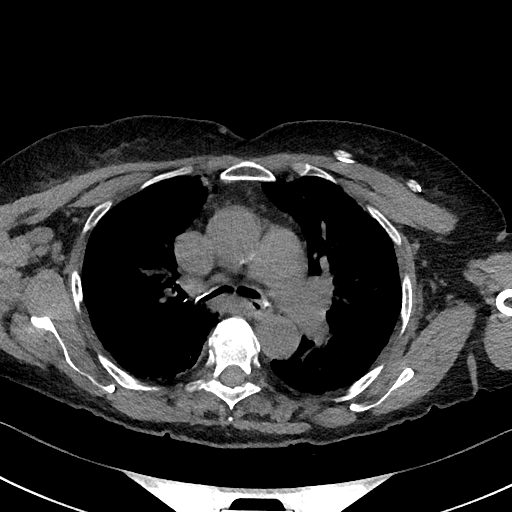
[im 107/152  lung]
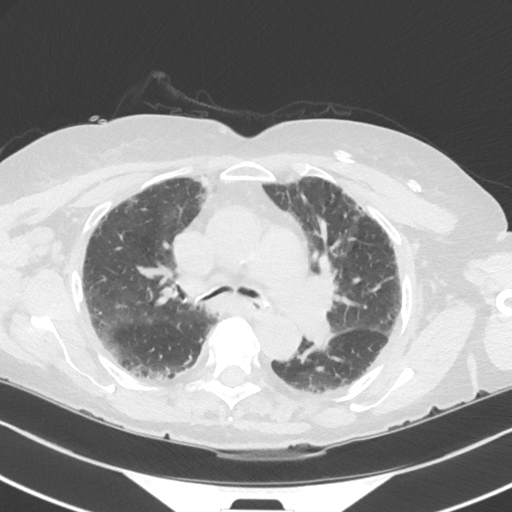
[im 118/152  lung]
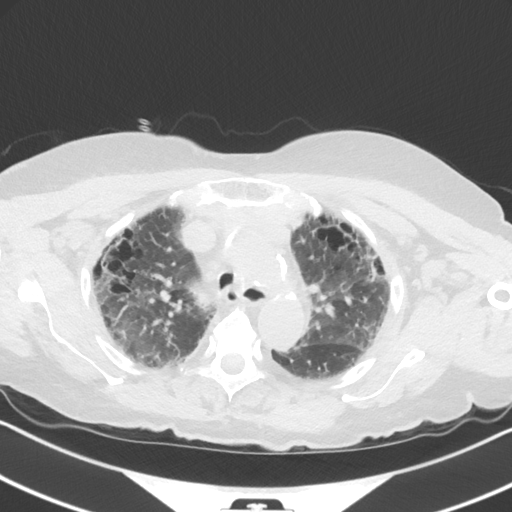
[im 129/152  lung]
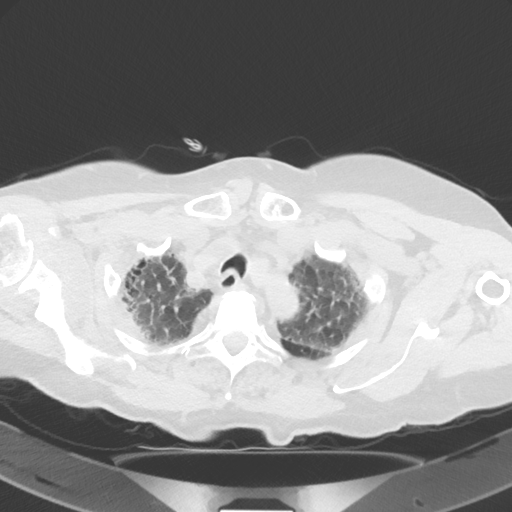
[im 140/152  lung]
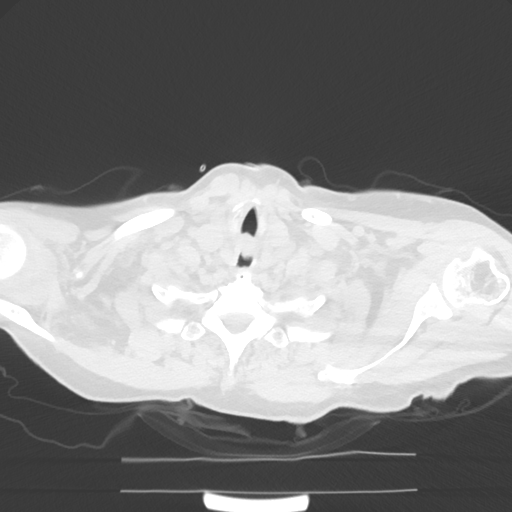

[Series 6: chest w/o 3mm st cor · coronal · non-contrast · 0.62mm/px · 3 of 136 slices shown]
[im 28/136  lung]
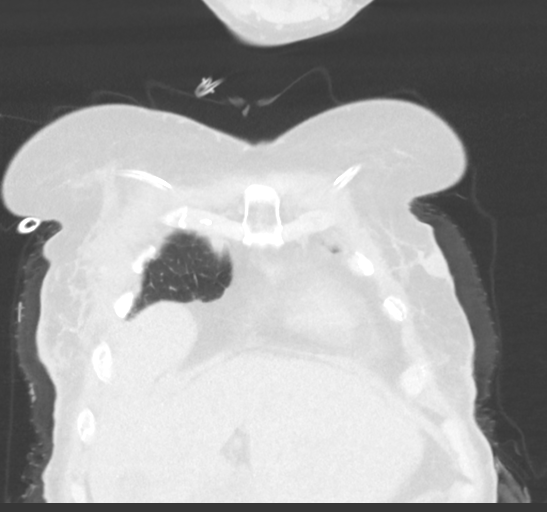
[im 55/136  lung]
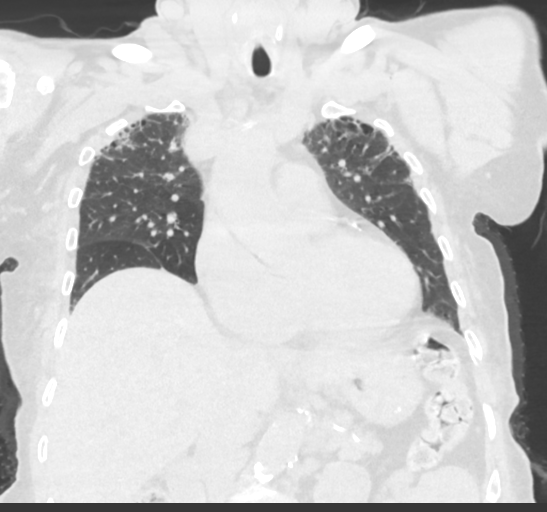
[im 82/136  lung]
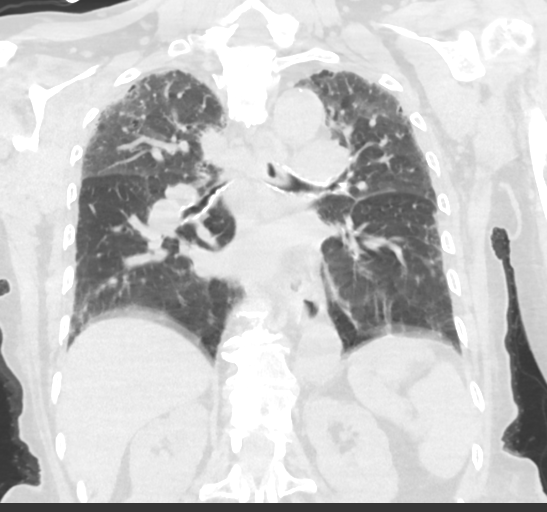

[15 of 36 positions shown; findings below may reference images not displayed]

FINDINGS: Cardiovascular: Heart borderline enlarged. No pericardial effusion.
Left coronary artery atherosclerotic calcifications. Mild aortic
atherosclerotic calcifications across the arch.

Mediastinum/Nodes: No mediastinal or hilar masses. No enlarged lymph
nodes. Posterior aspect of the tracheal wall is indented consistent
with and expiratory phase study. Trachea is patent. Esophagus is
unremarkable.

Lungs/Pleura: Lungs demonstrate upper lobe paraseptal and
centrilobular emphysema. Paraseptal emphysema is also noted
posteriorly in the lower lobes. There are areas of segmental
bronchial wall thickening and mucous plugging, most evident in the
lower lobes. Mild heterogeneous peripheral interstitial thickening
is noted bilaterally. There is also mild hazy increased attenuation
in the upper lobes, that is likely due to air trapping from the low
lung volumes. No convincing pneumonia. There is no evidence of
pulmonary edema. No lung mass and no suspicious nodule. No pleural
effusion or pneumothorax.

Upper Abdomen: No definite acute finding. Gallbladder wall is not
well-defined. Cannot exclude wall thickening.

Musculoskeletal: Bilateral anterior chest wall surgical drains. No
chest lateral collection to suggest an abscess or seroma/hematoma.
Chronic appearing mild compression fracture of L1. No acute
fractures. No osteoblastic or osteolytic lesions. There are
degenerative changes throughout the thoracic spine.
IMPRESSION: 1. No evidence of pneumonia or pulmonary edema.
2. Bilateral areas of segmental bronchus mucous plugging. Findings
that support associated air trapping accentuated by the expiratory
timing of this exam.
3. Mild chronic centrilobular and paraseptal emphysema.
4. Postsurgical changes along the anterior chest wall. No evidence
of a surgical complication.
5. Left coronary artery and aortic atherosclerotic calcifications.
6. Gallbladder wall is ill-defined. If there is right upper quadrant
pain, consider follow-up limited right upper quadrant ultrasound for
further assessment.

Aortic Atherosclerosis (33I5M-XQ4.4) and Emphysema (33I5M-8RX.J).

## 2019-11-23 ENCOUNTER — Other Ambulatory Visit: Payer: Self-pay | Admitting: Adult Health

## 2019-11-28 DIAGNOSIS — N390 Urinary tract infection, site not specified: Secondary | ICD-10-CM | POA: Diagnosis not present

## 2019-11-28 DIAGNOSIS — E559 Vitamin D deficiency, unspecified: Secondary | ICD-10-CM | POA: Diagnosis not present

## 2019-11-28 DIAGNOSIS — N183 Chronic kidney disease, stage 3 unspecified: Secondary | ICD-10-CM | POA: Diagnosis not present

## 2019-11-28 DIAGNOSIS — I129 Hypertensive chronic kidney disease with stage 1 through stage 4 chronic kidney disease, or unspecified chronic kidney disease: Secondary | ICD-10-CM | POA: Diagnosis not present

## 2019-11-28 DIAGNOSIS — N179 Acute kidney failure, unspecified: Secondary | ICD-10-CM | POA: Diagnosis not present

## 2019-11-29 ENCOUNTER — Encounter: Payer: Self-pay | Admitting: Internal Medicine

## 2019-11-29 ENCOUNTER — Other Ambulatory Visit: Payer: Self-pay

## 2019-11-29 ENCOUNTER — Other Ambulatory Visit: Payer: Self-pay | Admitting: Adult Health

## 2019-11-29 ENCOUNTER — Telehealth: Payer: Self-pay | Admitting: *Deleted

## 2019-11-29 ENCOUNTER — Ambulatory Visit (INDEPENDENT_AMBULATORY_CARE_PROVIDER_SITE_OTHER): Payer: PPO | Admitting: Internal Medicine

## 2019-11-29 VITALS — BP 110/62 | HR 60 | Temp 97.6°F | Resp 16 | Ht 62.5 in | Wt 163.4 lb

## 2019-11-29 DIAGNOSIS — N1832 Chronic kidney disease, stage 3b: Secondary | ICD-10-CM

## 2019-11-29 DIAGNOSIS — F32A Depression, unspecified: Secondary | ICD-10-CM

## 2019-11-29 DIAGNOSIS — R7309 Other abnormal glucose: Secondary | ICD-10-CM

## 2019-11-29 DIAGNOSIS — J42 Unspecified chronic bronchitis: Secondary | ICD-10-CM | POA: Diagnosis not present

## 2019-11-29 DIAGNOSIS — N1831 Chronic kidney disease, stage 3a: Secondary | ICD-10-CM | POA: Diagnosis not present

## 2019-11-29 DIAGNOSIS — Z79899 Other long term (current) drug therapy: Secondary | ICD-10-CM | POA: Diagnosis not present

## 2019-11-29 DIAGNOSIS — K580 Irritable bowel syndrome with diarrhea: Secondary | ICD-10-CM

## 2019-11-29 DIAGNOSIS — I10 Essential (primary) hypertension: Secondary | ICD-10-CM | POA: Diagnosis not present

## 2019-11-29 DIAGNOSIS — E782 Mixed hyperlipidemia: Secondary | ICD-10-CM | POA: Diagnosis not present

## 2019-11-29 DIAGNOSIS — E559 Vitamin D deficiency, unspecified: Secondary | ICD-10-CM

## 2019-11-29 DIAGNOSIS — E039 Hypothyroidism, unspecified: Secondary | ICD-10-CM | POA: Diagnosis not present

## 2019-11-29 DIAGNOSIS — F988 Other specified behavioral and emotional disorders with onset usually occurring in childhood and adolescence: Secondary | ICD-10-CM

## 2019-11-29 MED ORDER — HYOSCYAMINE SULFATE ER 0.375 MG PO TB12: ORAL_TABLET | ORAL | 1 refills | Status: DC

## 2019-11-29 MED ORDER — BUPROPION HCL ER (XL) 150 MG PO TB24: ORAL_TABLET | ORAL | 1 refills | Status: DC

## 2019-11-29 MED FILL — buPROPion HCL ER (XL) 150 M: 150 | 30 days supply | Qty: 90 | Fill #3

## 2019-11-29 MED FILL — FAMOTIDINE 20 MG TABLET: 20 | 30 days supply | Qty: 60 | Fill #3

## 2019-11-29 MED FILL — LEVOTHYROXINE 125 MCG TABLE: 125 | 90 days supply | Qty: 90 | Fill #2

## 2019-11-29 NOTE — Patient Instructions (Signed)

## 2019-11-29 NOTE — Progress Notes (Signed)
History of Present Illness:       This very nice 69 y.o. DWF presents for 6 month follow up with HTN, HLD, Pre-Diabetes, Hypothyroidism and Vitamin D Deficiency. Patient's GERD is controlled on her meds.       Patient is treated for HTN & BP has been controlled at home. Today's BP is at goal - 110/62. Patient has had no complaints of any cardiac type chest pain, palpitations, dyspnea / orthopnea / PND, dizziness, claudication, or dependent edema. Patient has CKD3a and is also followed by Dr Arty Baumgartner      Hyperlipidemia is controlled with diet & meds. Patient denies myalgias or other med SE's. Last Lipids were not at goal:  Lab Results  Component Value Date   CHOL 183 08/30/2019   HDL 54 08/30/2019   LDLCALC 111 (H) 08/30/2019   TRIG 89 08/30/2019   CHOLHDL 3.4 08/30/2019    Also, the patient has history of PreDiabetes and has had no symptoms of reactive hypoglycemia, diabetic polys, paresthesias or visual blurring.  Last A1c was Normal & at goal:  Lab Results  Component Value Date   HGBA1C 5.4 05/25/2019       Further, the patient also has history of Vitamin D Deficiency and supplements vitamin D without any suspected side-effects. Last vitamin D was  At goal:  Lab Results  Component Value Date   VD25OH 1 05/25/2019    Current Outpatient Medications on File Prior to Visit  Medication Sig  . albuterol (VENTOLIN HFA) 108 (90 Base) MCG/ACT inhaler Inhale 1-2 puffs into the lungs every 4 (four) hours as needed for wheezing or shortness of breath.  . ALPRAZolam (XANAX) 0.5 MG tablet TAKE 1/2 TO 1 TABLET BY MOUTH THREE TIMES A DAY IF NEEDED FOR ANXIETY OR SLEEP (Patient taking differently: Take 0.25-0.5 mg by mouth 3 (three) times daily as needed for anxiety or sleep. )  . ascorbic acid (VITAMIN C) 250 MG tablet Take by mouth.  Marland Kitchen aspirin EC 81 MG tablet Take 81 mg by mouth daily.   Marland Kitchen atenolol (TENORMIN) 100 MG tablet TAKE 1 TABLET BY MOUTH ONCE DAILY FOR BP  .  Budeson-Glycopyrrol-Formoterol (BREZTRI AEROSPHERE) 160-9-4.8 MCG/ACT AERO Inhale 1 puff into the lungs 2 (two) times daily.  Marland Kitchen buPROPion (WELLBUTRIN XL) 150 MG 24 hr tablet Take 3 tablets (450 mg) every Morning for Mood & Depression  . Cholecalciferol (VITAMIN D-3) 5000 UNITS TABS Take 5,000 Units by mouth daily.   Marland Kitchen docusate sodium (COLACE) 100 MG capsule Take 100 mg by mouth 2 (two) times daily as needed for mild constipation.  . famotidine (PEPCID) 20 MG tablet Take 1 tablet 2 x  /day for Indigestion & Heartburn  . hyoscyamine (LEVBID) 0.375 MG 12 hr tablet Take 1 tablet 2 x /day for IBS  . hyoscyamine (LEVSIN SL) 0.125 MG SL tablet Dissolve 1 tablet under tongue every 4 hours if Needed for Nausea, Bloating, Cramping or Diarrhea  . ipratropium-albuterol (DUONEB) 0.5-2.5 (3) MG/3ML SOLN USE 1 VIAL IN NEBULIZER EVERY 4 HOURS AS NEEDED **MAX 6 DOSES A DAY**  . levothyroxine (SYNTHROID) 125 MCG tablet Take 1 tablet daily on an empty stomach with only water for 30 minutes & no Antacid meds, Calcium or Magnesium for 4 hours & avoid Biotin  . Magnesium 250 MG TABS Take 250 mg by mouth daily.  . ondansetron (ZOFRAN) 8 MG tablet TAKE 1 TABLET BY MOUTH 3 TIMES A DAY AS NEEDED FOR NAUSEA  .  pantoprazole (PROTONIX) 40 MG tablet Take 1 tablet (40 mg total) by mouth daily.  . promethazine (PHENERGAN) 25 MG tablet Take 1 tablet (25 mg total) by mouth every 6 (six) hours as needed for nausea or vomiting.  . sertraline (ZOLOFT) 100 MG tablet TAKE 1 TABLET DAILY FOR MOOD / DEPRESSION  . zinc gluconate 50 MG tablet Take 50 mg by mouth daily.   No current facility-administered medications on file prior to visit.    Allergies  Allergen Reactions  . Ace Inhibitors Other (See Comments)    Acute renal failure (lisinopril)  . Cephalexin Anaphylaxis  . Dilaudid [Hydromorphone Hcl] Anaphylaxis  . Robaxin [Methocarbamol] Anaphylaxis  . Macrobid [Nitrofurantoin] Other (See Comments)    Causes hyperthermia   .  Neurontin [Gabapentin]     Confusion   . Codeine Nausea Only  . Latex Rash  . Sulfa Antibiotics Other (See Comments)    Do not take per nephrology    PMHx:   Past Medical History:  Diagnosis Date  . ADHD (attention deficit hyperactivity disorder)   . AKI (acute kidney injury) ([place]) 03/09/2019  . Allergy    seasonal  . Cataract   . CHF (congestive heart failure) ([place])    to be evaluated  . Chronic kidney disease (CKD), stage III (moderate)   . Depression   . Diverticulosis   . Emphysema lung ([place])   . Emphysema of lung ([place])    no home oxygen  . Encephalopathy, metabolic 4/48/1856  . GERD (gastroesophageal reflux disease)   . Helicobacter pylori (H. pylori)   . Hyperlipidemia   . Hypertension   . IBS (irritable bowel syndrome)   . LBBB (left bundle branch block)   . Lower urinary tract infection 06/21/2017  . Migraines   . OCD (obsessive compulsive disorder)   . Osteoporosis    osteopenia  . Prediabetes    pcp  . Renal disorder    AKI due to lisinpril, his. pyelonephritis  . Sepsis ([place])   . Sleep apnea    no CPAP  . Stroke ([place])    TIAs  . Thyroid disease   . TIA (transient ischemic attack)   . Vitamin D deficiency     Immunization History  Administered Date(s) Administered  . Influenza, High Dose Seasonal PF 03/22/2017, 02/16/2018, 03/22/2019  . Influenza-Unspecified 01/31/2016  . PFIZER SARS-COV-2 Vaccination 07/05/2019, 07/26/2019  . Pneumococcal Conjugate-13 03/22/2017  . Pneumococcal Polysaccharide-23 02/12/1999, 06/29/2018  . Td 04/27/2013  . Zoster 11/24/2005    Past Surgical History:  Procedure Laterality Date  . AUGMENTATION MAMMAPLASTY    . BREAST ENHANCEMENT SURGERY    . BREAST IMPLANT REMOVAL Bilateral 03/07/2019  . BUNIONECTOMY     Bil  . COLON SURGERY     colonoscopy  . COLONOSCOPY  2014  . ESOPHAGOGASTRODUODENOSCOPY (EGD) WITH PROPOFOL N/A 03/12/2019   Procedure: ESOPHAGOGASTRODUODENOSCOPY (EGD) WITH PROPOFOL;  Surgeon: Carol Ada, MD;  Location: [facility];  Service: Endoscopy;  Laterality: N/A;  . HEMORRHOID SURGERY    . OOPHORECTOMY    . ORIF ANKLE FRACTURE Left 08/30/2017   Procedure: OPEN REDUCTION INTERNAL FIXATION (ORIF) ANKLE FRACTURE;  Surgeon: Rod Can, MD;  Location: [facility];  Service: Orthopedics;  Laterality: Left;  . TONSILLECTOMY      FHx:    Reviewed / unchanged  SHx:    Reviewed / unchanged   Systems Review:  Constitutional: Denies fever, chills, wt changes, headaches, insomnia, fatigue, night sweats, change in appetite. Eyes: Denies redness, blurred vision, diplopia,  discharge, itchy, watery eyes.  ENT: Denies discharge, congestion, post nasal drip, epistaxis, sore throat, earache, hearing loss, dental pain, tinnitus, vertigo, sinus pain, snoring.  CV: Denies chest pain, palpitations, irregular heartbeat, syncope, dyspnea, diaphoresis, orthopnea, PND, claudication or edema. Respiratory: denies cough, dyspnea, DOE, pleurisy, hoarseness, laryngitis, wheezing.  Gastrointestinal: Denies dysphagia, odynophagia, heartburn, reflux, water brash, abdominal pain or cramps, nausea, vomiting, bloating, diarrhea, constipation, hematemesis, melena, hematochezia  or hemorrhoids. Genitourinary: Denies dysuria, frequency, urgency, nocturia, hesitancy, discharge, hematuria or flank pain. Musculoskeletal: Denies arthralgias, myalgias, stiffness, jt. swelling, pain, limping or strain/sprain.  Skin: Denies pruritus, rash, hives, warts, acne, eczema or change in skin lesion(s). Neuro: No weakness, tremor, incoordination, spasms, paresthesia or pain. Psychiatric: Denies confusion, memory loss or sensory loss. Endo: Denies change in weight, skin or hair change.  Heme/Lymph: No excessive bleeding, bruising or enlarged lymph nodes.  Physical Exam  BP 110/62   Pulse 60   Temp 97.6 F (36.4 C)   Resp 16   Ht 5' 2.5" (1.588 m)   Wt 163 lb 6.4 oz (74.1 kg)   BMI 29.41 kg/m   Appears  well nourished,  well groomed  and in no distress.  Eyes: PERRLA, EOMs, conjunctiva no swelling or erythema. Sinuses: No frontal/maxillary tenderness ENT/Mouth: EAC's clear, TM's nl w/o erythema, bulging. Nares clear w/o erythema, swelling, exudates. Oropharynx clear without erythema or exudates. Oral hygiene is good. Tongue normal, non obstructing. Hearing intact.  Neck: Supple. Thyroid not palpable. Car 2+/2+ without bruits, nodes or JVD. Chest: Respirations nl with BS clear & equal w/o rales, rhonchi, wheezing or stridor.  Cor: Heart sounds normal w/ regular rate and rhythm without sig. murmurs, gallops, clicks or rubs. Peripheral pulses normal and equal  without edema.  Abdomen: Soft & bowel sounds normal. Non-tender w/o guarding, rebound, hernias, masses or organomegaly.  Lymphatics: Unremarkable.  Musculoskeletal: Full ROM all peripheral extremities, joint stability, 5/5 strength and normal gait.  Skin: Warm, dry without exposed rashes, lesions or ecchymosis apparent.  Neuro: Cranial nerves intact, reflexes equal bilaterally. Sensory-motor testing grossly intact. Tendon reflexes grossly intact.  Pysch: Alert & oriented x 3.  Insight and judgement nl & appropriate. No ideations.  Assessment and Plan:  1. Essential hypertension  - Continue medication, monitor blood pressure at home.  - Continue DASH diet.  Reminder to go to the ER if any CP,  SOB, nausea, dizziness, severe HA, changes vision/speech.  - CBC with Differential/Platelet - COMPLETE METABOLIC PANEL WITH GFR - Magnesium - TSH  2. Hyperlipidemia, mixed  - Continue diet/meds, exercise,& lifestyle modifications.  - Continue monitor periodic cholesterol/liver & renal functions   - Lipid panel - TSH  3. Abnormal glucose  - Continue diet, exercise  - Lifestyle modifications.  - Monitor appropriate labs.  - Hemoglobin A1c - Insulin, random  4. Vitamin D deficiency  - Continue supplementation.  - VITAMIN D 25 Hydroxy  5.  Hypothyroidism  - Lipid panel  6. Chronic bronchitis   ([place])   7. CKD Stage 3 ([facility])  - COMPLETE METABOLIC PANEL WITH GFR  8. Medication management  - CBC with Differential/Platelet - COMPLETE METABOLIC PANEL WITH GFR - Magnesium - Lipid panel - TSH - Hemoglobin A1c - Insulin, random - VITAMIN D 25 Hydroxy         Discussed  regular exercise, BP monitoring, weight control to achieve/maintain BMI less than 25 and discussed med and SE's. Recommended labs to assess and monitor clinical status with further disposition pending results of labs.  I discussed the assessment and treatment plan with the patient. The patient was provided an opportunity to ask questions and all were answered. The patient agreed with the plan and demonstrated an understanding of the instructions.  I provided over 30 minutes of exam, counseling, chart review and  complex critical decision making.   Kirtland Bouchard, MD

## 2019-11-29 NOTE — Telephone Encounter (Signed)
Refill of Bupropion sent to [facility].

## 2019-11-30 LAB — LIPID PANEL
Cholesterol: 220 mg/dL — ABNORMAL HIGH (ref <200)
HDL: 65 mg/dL (ref >=50)
LDL Cholesterol (Calc): 135 mg/dL (calc) — ABNORMAL HIGH
Non-HDL Cholesterol (Calc): 155 mg/dL (calc) — ABNORMAL HIGH (ref <130)
Total CHOL/HDL Ratio: 3.4 (calc) (ref <5.0)
Triglycerides: 96 mg/dL (ref <150)

## 2019-11-30 LAB — HEMOGLOBIN A1C
Hgb A1c MFr Bld: 5.5 % of total Hgb (ref <5.7)
Mean Plasma Glucose: 111 (calc)
eAG (mmol/L): 6.2 (calc)

## 2019-11-30 LAB — MAGNESIUM: Magnesium: 2 mg/dL (ref 1.5–2.5)

## 2019-11-30 LAB — TSH: TSH: 1.71 mIU/L (ref 0.40–4.50)

## 2019-11-30 LAB — INSULIN, RANDOM: Insulin: 2.6 u[IU]/mL

## 2019-11-30 NOTE — Progress Notes (Signed)
============================================================ ° °-    Chol = 220 - elevated   (Ideal or goal is less than 180 )   - and  - Bad / Dangerous  LDL Chol = 135 - very, very elevated  (Ideal or goal is less than 70 ! )   - Recommend  a much stricter low cholesterol diet   - Cholesterol only comes from animal sources  - ie. meat, dairy, egg yolks  - Eat all the vegetables you want.  - Avoid meat, especially red meat - Beef AND Pork .  - Avoid cheese & dairy - milk & ice cream.     - Cheese is the most concentrated form of trans-fats which  is the worst thing to clog up our arteries.   - Veggie cheese is OK which can be found in the fresh  produce section at [employer] or [employer] or [employer]  - If not significantly improve with a better diet  then will need to  start meds for Chol   ============================================================  -  A1c= 5.5% - Normal - No Diabetes ============================================================  -  All Else - CBC - Kidneys - Electrolytes - Liver - Magnesium & Thyroid    - all  Normal / OK ============================================================

## 2019-12-06 ENCOUNTER — Ambulatory Visit: Payer: PPO | Admitting: Cardiovascular Disease

## 2019-12-07 ENCOUNTER — Other Ambulatory Visit: Payer: Self-pay

## 2019-12-07 ENCOUNTER — Encounter: Payer: Self-pay | Admitting: Cardiovascular Disease

## 2019-12-07 ENCOUNTER — Ambulatory Visit: Payer: PPO | Admitting: Cardiovascular Disease

## 2019-12-07 VITALS — BP 122/73 | HR 55 | Temp 97.2°F | Ht 63.0 in | Wt 161.4 lb

## 2019-12-07 DIAGNOSIS — I447 Left bundle-branch block, unspecified: Secondary | ICD-10-CM

## 2019-12-07 DIAGNOSIS — I77810 Thoracic aortic ectasia: Secondary | ICD-10-CM

## 2019-12-07 DIAGNOSIS — E782 Mixed hyperlipidemia: Secondary | ICD-10-CM

## 2019-12-07 DIAGNOSIS — I5032 Chronic diastolic (congestive) heart failure: Secondary | ICD-10-CM | POA: Diagnosis not present

## 2019-12-07 NOTE — Patient Instructions (Signed)
Medication Instructions:  The current medical regimen is effective;  continue present plan and medications.  *If you need a refill on your cardiac medications before your next appointment, please call your pharmacy*   Follow-Up: At [facility], you and your health needs are our priority.  As part of our continuing mission to provide you with exceptional heart care, we have created designated Provider Care Teams.  These Care Teams include your primary Cardiologist (physician) and Advanced Practice Providers (APPs -  Physician Assistants and Nurse Practitioners) who all work together to provide you with the care you need, when you need it.  We recommend signing up for the patient portal called "MyChart".  Sign up information is provided on this After Visit Summary.  MyChart is used to connect with patients for Virtual Visits (Telemedicine).  Patients are able to view lab/test results, encounter notes, upcoming appointments, etc.  Non-urgent messages can be sent to your provider as well.   To learn more about what you can do with MyChart, go to [email or web address].    Your next appointment:   6 month(s)  The format for your next appointment:   In Person  Provider:   [place] O'Neal, MD      

## 2019-12-07 NOTE — Progress Notes (Signed)
Cardiology Office Note:   Date:  12/07/2019  NAME:  [name]    MRN: [id number] DOB:  09/25/1950   PCP:  Unk [name], MD  Cardiologist:  Jenkins Rouge, MD   Referring MD: Liane Comber, NP   Chief Complaint  Patient presents with  . Congestive Heart Failure   History of Present Illness:   [name] is a 69 y.o. female with a hx of COPD, left bundle branch block, hypertension who is being seen today for the evaluation of diastolic heart failure at the request of Unk [name], MD.  She was admitted to the hospital in November 2020 for a breast implant removal.  Apparently the day after discharge she presented back to the hospital with some sort of anaphylactic reaction.  She also had trouble breathing and there was concerns for sepsis.  She was given a lot of intravenous fluids and was diagnosed with congestive heart failure and mildly elevated troponin.  Her echocardiogram was normal.  She had no symptoms concerning for an acute coronary syndrome.  EKG demonstrated chronic left bundle branch block.  She was sent home to follow-up with cardiology as an outpatient for possible stress testing.  She reports has been doing well since that time.  Apparently when she was seen by her primary care physician in July for sudden onset weight gain.  A BNP value was checked and noted to be around 200.  She was given Lasix and her lower extremity edema and symptoms improved.  She reports has had no further issues since that time.  She has had a recent CT scan of her chest that shows progression of her emphysema.  She is being treated for that.  She reports she gets no chest pain or pressure when she does activities such as climbing a flight of stairs.  She seems to be doing fairly well since that hospitalization and doing well since then.  Her blood pressure is well controlled today.  Her EKG today demonstrates her chronic left bundle branch block.  She has no evidence of heart failure on  examination.  Problem List 1. LBBB -EF 55-60% 2. COPD 3. HFpEF -Dx 02/2019 in setting of sepsis and anaphylactic reaction 4.  Hyperlipidemia -Total cholesterol 220, HDL 65, LDL 135, triglycerides 96, A1c 5.5 5.  CKD stage III  Past Medical History: Past Medical History:  Diagnosis Date  . ADHD (attention deficit hyperactivity disorder)   . AKI (acute kidney injury) ([place]) 03/09/2019  . Allergy    seasonal  . Cataract   . CHF (congestive heart failure) ([place])    to be evaluated  . Chronic kidney disease (CKD), stage III (moderate)   . Depression   . Diverticulosis   . Emphysema lung ([place])   . Emphysema of lung ([place])    no home oxygen  . Encephalopathy, metabolic 10/24/4763  . GERD (gastroesophageal reflux disease)   . Helicobacter pylori (H. pylori)   . Hyperlipidemia   . Hypertension   . IBS (irritable bowel syndrome)   . LBBB (left bundle branch block)   . Lower urinary tract infection 06/21/2017  . Migraines   . OCD (obsessive compulsive disorder)   . Osteoporosis    osteopenia  . Prediabetes    pcp  . Renal disorder    AKI due to lisinpril, his. pyelonephritis  . Sepsis ([place])   . Sleep apnea    no CPAP  . Stroke ([place])    TIAs  . Thyroid disease   .  TIA (transient ischemic attack)   . Vitamin D deficiency     Past Surgical History: Past Surgical History:  Procedure Laterality Date  . AUGMENTATION MAMMAPLASTY    . BREAST ENHANCEMENT SURGERY    . BREAST IMPLANT REMOVAL Bilateral 03/07/2019  . BUNIONECTOMY     Bil  . COLON SURGERY     colonoscopy  . COLONOSCOPY  2014  . ESOPHAGOGASTRODUODENOSCOPY (EGD) WITH PROPOFOL N/A 03/12/2019   Procedure: ESOPHAGOGASTRODUODENOSCOPY (EGD) WITH PROPOFOL;  Surgeon: Carol Ada, MD;  Location: [facility];  Service: Endoscopy;  Laterality: N/A;  . HEMORRHOID SURGERY    . OOPHORECTOMY    . ORIF ANKLE FRACTURE Left 08/30/2017   Procedure: OPEN REDUCTION INTERNAL FIXATION (ORIF) ANKLE FRACTURE;  Surgeon: Rod Can, MD;  Location: [facility];  Service: Orthopedics;  Laterality: Left;  . TONSILLECTOMY      Current Medications: Current Meds  Medication Sig  . albuterol (VENTOLIN HFA) 108 (90 Base) MCG/ACT inhaler Inhale 1-2 puffs into the lungs every 4 (four) hours as needed for wheezing or shortness of breath.  . ALPRAZolam (XANAX) 0.5 MG tablet TAKE 1/2 TO 1 TABLET BY MOUTH THREE TIMES A DAY IF NEEDED FOR ANXIETY OR SLEEP (Patient taking differently: Take 0.25-0.5 mg by mouth 3 (three) times daily as needed for anxiety or sleep. )  . ascorbic acid (VITAMIN C) 250 MG tablet Take by mouth.  Marland Kitchen aspirin EC 81 MG tablet Take 81 mg by mouth daily.   Marland Kitchen atenolol (TENORMIN) 100 MG tablet TAKE 1 TABLET BY MOUTH ONCE DAILY FOR BP  . Budeson-Glycopyrrol-Formoterol (BREZTRI AEROSPHERE) 160-9-4.8 MCG/ACT AERO Inhale 1 puff into the lungs 2 (two) times daily.  Marland Kitchen buPROPion (WELLBUTRIN XL) 150 MG 24 hr tablet Take 3 tablets (450 mg) every Morning for Mood & Depression  . Cholecalciferol (VITAMIN D-3) 5000 UNITS TABS Take 5,000 Units by mouth daily.   Marland Kitchen docusate sodium (COLACE) 100 MG capsule Take 100 mg by mouth 2 (two) times daily as needed for mild constipation.  . famotidine (PEPCID) 20 MG tablet Take 1 tablet 2 x  /day for Indigestion & Heartburn  . hyoscyamine (LEVBID) 0.375 MG 12 hr tablet Take 1 tablet 2 x /day for IBS  . hyoscyamine (LEVSIN SL) 0.125 MG SL tablet Dissolve 1 tablet under tongue every 4 hours if Needed for Nausea, Bloating, Cramping or Diarrhea  . ipratropium-albuterol (DUONEB) 0.5-2.5 (3) MG/3ML SOLN USE 1 VIAL IN NEBULIZER EVERY 4 HOURS AS NEEDED **MAX 6 DOSES A DAY**  . levothyroxine (SYNTHROID) 125 MCG tablet Take 1 tablet daily on an empty stomach with only water for 30 minutes & no Antacid meds, Calcium or Magnesium for 4 hours & avoid Biotin  . Magnesium 250 MG TABS Take 250 mg by mouth daily.  . ondansetron (ZOFRAN) 8 MG tablet TAKE 1 TABLET BY MOUTH 3 TIMES A DAY AS NEEDED FOR NAUSEA  .  pantoprazole (PROTONIX) 40 MG tablet Take 1 tablet (40 mg total) by mouth daily.  . promethazine (PHENERGAN) 25 MG tablet Take 1 tablet (25 mg total) by mouth every 6 (six) hours as needed for nausea or vomiting.  . sertraline (ZOLOFT) 100 MG tablet TAKE 1 TABLET DAILY FOR MOOD / DEPRESSION  . zinc gluconate 50 MG tablet Take 50 mg by mouth daily.     Allergies:    Ace inhibitors, Cephalexin, Dilaudid [hydromorphone hcl], Robaxin [methocarbamol], Macrobid [nitrofurantoin], Neurontin [gabapentin], Codeine, Latex, and Sulfa antibiotics   Social History: Social History   Socioeconomic History  .  Marital status: Legally Separated    Spouse name: Not on file  . Number of children: Not on file  . Years of education: Not on file  . Highest education level: Not on file  Occupational History  . Not on file  Tobacco Use  . Smoking status: Former Smoker    Packs/day: 1.00    Years: 40.00    Pack years: 40.00    Types: Cigarettes    Quit date: 04/27/2002    Years since quitting: 17.6  . Smokeless tobacco: Never Used  Vaping Use  . Vaping Use: Never used  Substance and Sexual Activity  . Alcohol use: No  . Drug use: No  . Sexual activity: Not on file  Other Topics Concern  . Not on file  Social History Narrative  . Not on file   Social Determinants of Health   Financial Resource Strain:   . Difficulty of Paying Living Expenses:   Food Insecurity:   . Worried About Charity fundraiser in the Last Year:   . Arboriculturist in the Last Year:   Transportation Needs:   . Film/video editor (Medical):   Marland Kitchen Lack of Transportation (Non-Medical):   Physical Activity:   . Days of Exercise per Week:   . Minutes of Exercise per Session:   Stress:   . Feeling of Stress :   Social Connections:   . Frequency of Communication with Friends and Family:   . Frequency of Social Gatherings with Friends and Family:   . Attends Religious Services:   . Active Member of Clubs or Organizations:    . Attends Archivist Meetings:   Marland Kitchen Marital Status:      Family History: The patient's family history includes Colon polyps in her mother. There is no history of Colon cancer, Esophageal cancer, Rectal cancer, or Stomach cancer.  ROS:   All other ROS reviewed and negative. Pertinent positives noted in the HPI.     EKGs/Labs/Other Studies Reviewed:   The following studies were personally reviewed by me today:  EKG:  EKG is ordered today.  The ekg ordered today demonstrates sinus bradycardia, heart rate 55, left bundle branch block noted, and was personally reviewed by me.   TTE 03/10/2019 1. Left ventricular ejection fraction, by visual estimation, is 55 to  60%. The left ventricle has normal function. There is no left ventricular  hypertrophy.  2. Abnormal septal motion consistent with left bundle branch block.  3. Left ventricular diastolic parameters are consistent with Grade I  diastolic dysfunction (impaired relaxation).  4. The left ventricle has no regional wall motion abnormalities.  5. Global right ventricle has normal systolic function.The right  ventricular size is moderately enlarged. No increase in right ventricular  wall thickness.  6. Left atrial size was normal.  7. Right atrial size was mildly dilated.  8. The mitral valve is normal in structure. No evidence of mitral valve  regurgitation.  9. The tricuspid valve is grossly normal. Tricuspid valve regurgitation  mild-moderate.  10. The aortic valve is grossly normal. Aortic valve regurgitation is not  visualized.  11. The pulmonic valve was not assessed. Pulmonic valve regurgitation is  not visualized.  12. The aortic root was not well visualized.  13. Mildly elevated pulmonary artery systolic pressure.  14. The inferior vena cava is dilated in size with <50% respiratory  variability, suggesting right atrial pressure of 15 mmHg.  15. Technically limited study. Only subcostal views were  obtained.   Recent Labs: 10/27/2019: Brain Natriuretic Peptide 207 10/31/2019: Hemoglobin 14.0; Platelets 411 11/03/2019: ALT 11; BUN 48; Creat 1.70; Potassium 4.2; Sodium 141 11/29/2019: Magnesium 2.0; TSH 1.71   Recent Lipid Panel    Component Value Date/Time   CHOL 220 (H) 11/29/2019 1024   TRIG 96 11/29/2019 1024   HDL 65 11/29/2019 1024   CHOLHDL 3.4 11/29/2019 1024   VLDL 16 09/01/2016 1036   LDLCALC 135 (H) 11/29/2019 1024    Physical Exam:   VS:  BP 122/73   Pulse (!) 55   Temp (!) 97.2 F (36.2 C)   Ht 5\' 3"  (1.6 m)   Wt 161 lb 6.4 oz (73.2 kg)   SpO2 96%   BMI 28.59 kg/m    Wt Readings from Last 3 Encounters:  12/07/19 161 lb 6.4 oz (73.2 kg)  11/29/19 163 lb 6.4 oz (74.1 kg)  11/03/19 161 lb 3.2 oz (73.1 kg)    General: Well nourished, well developed, in no acute distress Heart: Atraumatic, normal size  Eyes: PEERLA, EOMI  Neck: Supple, no JVD Endocrine: No thryomegaly Cardiac: Normal S1, S2; RRR; no murmurs, rubs, or gallops Lungs: Clear to auscultation bilaterally, no wheezing, rhonchi or rales  Abd: Soft, nontender, no hepatomegaly  Ext: No edema, pulses 2+ Musculoskeletal: No deformities, BUE and BLE strength normal and equal Skin: Warm and dry, no rashes   Neuro: Alert and oriented to person, place, time, and situation, CNII-XII grossly intact, no focal deficits  Psych: Normal mood and affect   ASSESSMENT:   [name] is a 69 y.o. female who presents for the following: 1. Chronic diastolic heart failure ([place])   2. LBBB (left bundle branch block)   3. Hyperlipidemia, mixed   4. Ascending aorta dilation (H[facility])     PLAN:   1. Chronic diastolic heart failure ([facility]) -Ejection fraction 55 to 60%.  Grade 1 diastolic dysfunction.  She was diagnosed with volume overload in the setting of sepsis in November 2020.  She apparently also was seen for primary care physician in July of this year with increased fluid.  She took Lasix as a 1 times dose and her  symptoms improved.  Her echocardiogram from November shows normal LV function grade 1 diastolic dysfunction.  She has no symptoms of heart failure at this time.  She does have significant COPD which is treated.  She reports that she is had no further lower extremity edema.  She continues to take Lasix as needed.  She is able to climb several flights of stairs without any symptoms of angina.  We did discuss we could pursue a stress test if she would like to however I see this of limited utility.  She really describes no symptoms concerning for angina.  She took a one-time dose of Lasix and her symptoms have resolved.  I think the best thing for [place] to do is continue with her Lasix as needed and to see her back in 6 months to see if her symptoms come back.  She seems to be doing quite well without any major limitations today on my examination.  2. LBBB (left bundle branch block) -Chronic.  Normal LV function.  3. Hyperlipidemia, mixed -Most recent LDL cholesterol 135.  She does have coronary calcification seen on recent CT chest.  We will discuss statin therapy at her next visit.  She does take an aspirin 81 mg daily.  4. Ascending aorta dilation (HCC) -Ascending aorta is 3.8 cm.  This  is normal for age.  I would not recommend further follow-up testing of this.  Disposition: Return in about 6 months (around 06/08/2020).  Medication Adjustments/Labs and Tests Ordered: Current medicines are reviewed at length with the patient today.  Concerns regarding medicines are outlined above.  Orders Placed This Encounter  Procedures  . EKG 12-Lead   No orders of the defined types were placed in this encounter.   Patient Instructions  Medication Instructions:  The current medical regimen is effective;  continue present plan and medications.  *If you need a refill on your cardiac medications before your next appointment, please call your pharmacy*   Follow-Up: At [facility], you and your health needs  are our priority.  As part of our continuing mission to provide you with exceptional heart care, we have created designated Provider Care Teams.  These Care Teams include your primary Cardiologist (physician) and Advanced Practice Providers (APPs -  Physician Assistants and Nurse Practitioners) who all work together to provide you with the care you need, when you need it.  We recommend signing up for the patient portal called "MyChart".  Sign up information is provided on this After Visit Summary.  MyChart is used to connect with patients for Virtual Visits (Telemedicine).  Patients are able to view lab/test results, encounter notes, upcoming appointments, etc.  Non-urgent messages can be sent to your provider as well.   To learn more about what you can do with MyChart, go to [email or web address].    Your next appointment:   6 month(s)  The format for your next appointment:   In Person  Provider:   Eleonore Chiquito, MD        Signed, Addison Naegeli. Audie Box, [place]  [address] [place], [place] [address] [phone]  12/07/2019 3:14 PM

## 2019-12-15 MED FILL — ATENOLOL 100 MG TABLET: 100 | 90 days supply | Qty: 90 | Fill #1

## 2020-01-03 ENCOUNTER — Other Ambulatory Visit: Payer: Self-pay | Admitting: Internal Medicine

## 2020-01-03 DIAGNOSIS — F32A Depression, unspecified: Secondary | ICD-10-CM

## 2020-01-03 DIAGNOSIS — F988 Other specified behavioral and emotional disorders with onset usually occurring in childhood and adolescence: Secondary | ICD-10-CM

## 2020-01-03 MED ORDER — BUPROPION HCL ER (XL) 150 MG PO TB24: ORAL_TABLET | ORAL | 3 refills | Status: DC

## 2020-01-03 MED FILL — FAMOTIDINE 20 MG TABLET: 20 | 30 days supply | Qty: 60 | Fill #4

## 2020-01-03 MED FILL — buPROPion HCL ER (XL) 150 M: 150 | 90 days supply | Qty: 270 | Fill #0

## 2020-01-08 ENCOUNTER — Other Ambulatory Visit: Payer: Self-pay

## 2020-01-08 ENCOUNTER — Encounter: Payer: Self-pay | Admitting: Cardiothoracic Surgery

## 2020-01-08 ENCOUNTER — Institutional Professional Consult (permissible substitution): Payer: PPO | Admitting: Cardiothoracic Surgery

## 2020-01-08 VITALS — BP 107/68 | HR 68 | Temp 97.7°F | Resp 20 | Ht 63.0 in | Wt 160.0 lb

## 2020-01-08 DIAGNOSIS — I712 Thoracic aortic aneurysm, without rupture: Secondary | ICD-10-CM

## 2020-01-08 DIAGNOSIS — I7121 Aneurysm of the ascending aorta, without rupture: Secondary | ICD-10-CM

## 2020-01-08 NOTE — Progress Notes (Signed)
[address]       [place] [address]             [facility] REPORT  Referring Provider is Vicie Mutters, PA-C Primary Cardiologist is Jenkins Rouge, MD PCP is Unk [name], MD  Chief Complaint  Patient presents with  . Thoracic Aortic Aneurysm    Surgical consult, Chest CT 11/02/19, ECHO 03/10/19    HPI:  69 year old lady underwent recent CT scan of the chest to evaluate chronic cough.  This demonstrated an  incidentally discovered mild aortic dilation of the ascending component of the aorta.  She has no prior history of aneurysm disease nor any significant family history of aneurysm disease.  She is not hypertensive and has a remote history of smoking.  She denies chest pain or back pain.  She did undergo an echocardiogram in the past which demonstrates a trileaflet aortic valve and she had a stress test several years ago for concern of myocardial enzyme leak during a sepsis episode.  This did not demonstrating significant reversible coronary ischemia. Past Medical History:  Diagnosis Date  . ADHD (attention deficit hyperactivity disorder)   . AKI (acute kidney injury) ([place]) 03/09/2019  . Allergy    seasonal  . Cataract   . CHF (congestive heart failure) ([facility])    to be evaluated  . Chronic kidney disease (CKD), stage III (moderate)   . Depression   . Diverticulosis   . Emphysema lung ([place])   . Emphysema of lung ([place])    no home oxygen  . Encephalopathy, metabolic 4/74/2595  . GERD (gastroesophageal reflux disease)   . Helicobacter pylori (H. pylori)   . Hyperlipidemia   . Hypertension   . IBS (irritable bowel syndrome)   . LBBB (left bundle branch block)   . Lower urinary tract infection 06/21/2017  . Migraines   . OCD (obsessive compulsive disorder)   . Osteoporosis    osteopenia  . Prediabetes    pcp  . Renal disorder    AKI due to lisinpril, his. pyelonephritis  . Sepsis ([place])   . Sleep  apnea    no CPAP  . Stroke ([place])    TIAs  . Thyroid disease   . TIA (transient ischemic attack)   . Vitamin D deficiency     Past Surgical History:  Procedure Laterality Date  . AUGMENTATION MAMMAPLASTY    . BREAST ENHANCEMENT SURGERY    . BREAST IMPLANT REMOVAL Bilateral 03/07/2019  . BUNIONECTOMY     Bil  . COLON SURGERY     colonoscopy  . COLONOSCOPY  2014  . ESOPHAGOGASTRODUODENOSCOPY (EGD) WITH PROPOFOL N/A 03/12/2019   Procedure: ESOPHAGOGASTRODUODENOSCOPY (EGD) WITH PROPOFOL;  Surgeon: Carol Ada, MD;  Location: [facility];  Service: Endoscopy;  Laterality: N/A;  . HEMORRHOID SURGERY    . OOPHORECTOMY    . ORIF ANKLE FRACTURE Left 08/30/2017   Procedure: OPEN REDUCTION INTERNAL FIXATION (ORIF) ANKLE FRACTURE;  Surgeon: Rod Can, MD;  Location: [facility];  Service: Orthopedics;  Laterality: Left;  . TONSILLECTOMY      Family History  Problem Relation Age of Onset  . Colon polyps Mother   . Colon cancer Neg Hx   . Esophageal cancer Neg Hx   . Rectal cancer Neg Hx   . Stomach cancer Neg Hx     Social History   Socioeconomic History  . Marital status: Legally Separated    Spouse name: Not  on file  . Number of children: Not on file  . Years of education: Not on file  . Highest education level: Not on file  Occupational History  . Not on file  Tobacco Use  . Smoking status: Former Smoker    Packs/day: 1.00    Years: 40.00    Pack years: 40.00    Types: Cigarettes    Quit date: 04/27/2002    Years since quitting: 17.7  . Smokeless tobacco: Never Used  Vaping Use  . Vaping Use: Never used  Substance and Sexual Activity  . Alcohol use: No  . Drug use: No  . Sexual activity: Not on file  Other Topics Concern  . Not on file  Social History Narrative  . Not on file   Social Determinants of Health   Financial Resource Strain:   . Difficulty of Paying Living Expenses: Not on file  Food Insecurity:   . Worried About Charity fundraiser in the Last  Year: Not on file  . Ran Out of Food in the Last Year: Not on file  Transportation Needs:   . Lack of Transportation (Medical): Not on file  . Lack of Transportation (Non-Medical): Not on file  Physical Activity:   . Days of Exercise per Week: Not on file  . Minutes of Exercise per Session: Not on file  Stress:   . Feeling of Stress : Not on file  Social Connections:   . Frequency of Communication with Friends and Family: Not on file  . Frequency of Social Gatherings with Friends and Family: Not on file  . Attends Religious Services: Not on file  . Active Member of Clubs or Organizations: Not on file  . Attends Archivist Meetings: Not on file  . Marital Status: Not on file  Intimate Partner Violence:   . Fear of Current or Ex-Partner: Not on file  . Emotionally Abused: Not on file  . Physically Abused: Not on file  . Sexually Abused: Not on file    Current Outpatient Medications  Medication Sig Dispense Refill  . albuterol (VENTOLIN HFA) 108 (90 Base) MCG/ACT inhaler Inhale 1-2 puffs into the lungs every 4 (four) hours as needed for wheezing or shortness of breath. 18 g 1  . ALPRAZolam (XANAX) 0.5 MG tablet TAKE 1/2 TO 1 TABLET BY MOUTH THREE TIMES A DAY IF NEEDED FOR ANXIETY OR SLEEP (Patient taking differently: Take 0.25-0.5 mg by mouth 3 (three) times daily as needed for anxiety or sleep. ) 90 tablet 0  . ascorbic acid (VITAMIN C) 250 MG tablet Take by mouth.    Marland Kitchen aspirin EC 81 MG tablet Take 81 mg by mouth daily.     Marland Kitchen atenolol (TENORMIN) 100 MG tablet TAKE 1 TABLET BY MOUTH ONCE DAILY FOR BP 90 tablet 3  . Budeson-Glycopyrrol-Formoterol (BREZTRI AEROSPHERE) 160-9-4.8 MCG/ACT AERO Inhale 1 puff into the lungs 2 (two) times daily. 10.7 g 2  . buPROPion (WELLBUTRIN XL) 150 MG 24 hr tablet Take 3 tablets (450 mg) every Morning for Mood & Depression 270 tablet 3  . Cholecalciferol (VITAMIN D-3) 5000 UNITS TABS Take 5,000 Units by mouth daily.     Marland Kitchen docusate sodium  (COLACE) 100 MG capsule Take 100 mg by mouth 2 (two) times daily as needed for mild constipation.    . famotidine (PEPCID) 20 MG tablet Take 1 tablet 2 x  /day for Indigestion & Heartburn 180 tablet 3  . hyoscyamine (LEVBID) 0.375 MG 12 hr tablet  Take 1 tablet 2 x /day for IBS 180 tablet 1  . hyoscyamine (LEVSIN SL) 0.125 MG SL tablet Dissolve 1 tablet under tongue every 4 hours if Needed for Nausea, Bloating, Cramping or Diarrhea 60 tablet 1  . ipratropium-albuterol (DUONEB) 0.5-2.5 (3) MG/3ML SOLN USE 1 VIAL IN NEBULIZER EVERY 4 HOURS AS NEEDED **MAX 6 DOSES A DAY** 540 mL 0  . levothyroxine (SYNTHROID) 125 MCG tablet Take 1 tablet daily on an empty stomach with only water for 30 minutes & no Antacid meds, Calcium or Magnesium for 4 hours & avoid Biotin 90 tablet 3  . Magnesium 250 MG TABS Take 250 mg by mouth daily.    . ondansetron (ZOFRAN) 8 MG tablet TAKE 1 TABLET BY MOUTH 3 TIMES A DAY AS NEEDED FOR NAUSEA 60 tablet 2  . pantoprazole (PROTONIX) 40 MG tablet Take 1 tablet (40 mg total) by mouth daily. 30 tablet 1  . promethazine (PHENERGAN) 25 MG tablet Take 1 tablet (25 mg total) by mouth every 6 (six) hours as needed for nausea or vomiting. 60 tablet 3  . sertraline (ZOLOFT) 100 MG tablet TAKE 1 TABLET DAILY FOR MOOD / DEPRESSION 90 tablet 3  . zinc gluconate 50 MG tablet Take 50 mg by mouth daily.     No current facility-administered medications for this visit.    Allergies  Allergen Reactions  . Ace Inhibitors Other (See Comments)    Acute renal failure (lisinopril)  . Cephalexin Anaphylaxis  . Dilaudid [Hydromorphone Hcl] Anaphylaxis  . Robaxin [Methocarbamol] Anaphylaxis  . Macrobid [Nitrofurantoin] Other (See Comments)    Causes hyperthermia   . Neurontin [Gabapentin]     Confusion   . Codeine Nausea Only  . Latex Rash  . Sulfa Antibiotics Other (See Comments)    Do not take per nephrology      Review of Systems:   General:  Denies weight change    Cardiac:  negative  Respiratory:  Remote h/o smoking  GI:   H/o irritable bowel disease  GU:   Interstitial cystitis  Vascular:  + varicose veins,   Neuro:   +mini-CVA/ TIA's,   Musculoskeletal: + arthritis & difficulty walking,   Skin:   + itching,   Psych:   + depression,   Eyes:   + floaters,   ENT:    last saw dentist 2019  Hematologic:  negative  Endocrine:  No  diabetes,      Physical Exam:   BP 107/68   Pulse 68   Temp 97.7 F (36.5 C) (Skin)   Resp 20   Ht 5\' 3"  (1.6 m)   Wt 72.6 kg   SpO2 95%   BMI 28.34 kg/m   General:  well-appearing  HEENT:  Unremarkable   Neck:   no JVD, no bruits, no adenopathy   Chest:   clear to auscultation, symmetrical breath sounds, no wheezes, no rhonchi   CV:   RRR, no  murmur   Abdomen:  soft, non-tender, no masses   Extremities:  warm, well-perfused, pulses intact, no LE edema  Rectal/GU  Deferred  Neuro:   Grossly non-focal and symmetrical throughout  Skin:   Clean and dry, no rashes, no breakdown   Diagnostic Tests:  I have personally reviewed the available imaging studies and agree with the interpretation as a 3.8 cm ascending aortic aneurysm. There is heavy calcification in the area of the LAD and LCx coronary arteries as well   Impression:  69 year old lady with incidentally discovered very  mild aortic dilation, not even qualifying as an aneurysm.  This does not warrant further follow-up particularly in light of her lack of personal or family history of aneurysm disease and  Plan:  Follow-up with thoracic surgery as needed Suggest calcium scoring scan to assess coronary disease evident on the noncontrast CT scan   I spent in excess of 30 minutes during the conduct of this office consultation and >50% of this time involved direct face-to-face encounter with the patient for counseling and/or coordination of their care.          Level 3 Office Consult = 40 minutes         Level 4 Office Consult = 60 minutes          Level 5 Office Consult = 80 minutes  B.  Murvin Natal, MD 01/08/2020 10:04 AM

## 2020-02-09 DIAGNOSIS — E559 Vitamin D deficiency, unspecified: Secondary | ICD-10-CM | POA: Diagnosis not present

## 2020-02-09 DIAGNOSIS — N183 Chronic kidney disease, stage 3 unspecified: Secondary | ICD-10-CM | POA: Diagnosis not present

## 2020-02-09 DIAGNOSIS — I129 Hypertensive chronic kidney disease with stage 1 through stage 4 chronic kidney disease, or unspecified chronic kidney disease: Secondary | ICD-10-CM | POA: Diagnosis not present

## 2020-02-09 DIAGNOSIS — Z23 Encounter for immunization: Secondary | ICD-10-CM | POA: Diagnosis not present

## 2020-02-09 DIAGNOSIS — N390 Urinary tract infection, site not specified: Secondary | ICD-10-CM | POA: Diagnosis not present

## 2020-02-09 DIAGNOSIS — N179 Acute kidney failure, unspecified: Secondary | ICD-10-CM | POA: Diagnosis not present

## 2020-02-19 MED FILL — FAMOTIDINE 20 MG TABLET: 20 | 30 days supply | Qty: 60 | Fill #5

## 2020-02-19 MED FILL — LEVOTHYROXINE 125 MCG TABLE: 125 | 90 days supply | Qty: 90 | Fill #3

## 2020-02-24 MED FILL — SERTRALINE HCL 100 MG TABS: 100 | 90 days supply | Qty: 90 | Fill #1

## 2020-02-24 MED FILL — ATENOLOL 100 MG TABLET: 100 | 90 days supply | Qty: 90 | Fill #2

## 2020-03-04 ENCOUNTER — Other Ambulatory Visit: Payer: Self-pay

## 2020-03-04 ENCOUNTER — Ambulatory Visit (INDEPENDENT_AMBULATORY_CARE_PROVIDER_SITE_OTHER): Payer: PPO | Admitting: Adult Health Nurse Practitioner

## 2020-03-04 ENCOUNTER — Encounter: Payer: Self-pay | Admitting: Adult Health Nurse Practitioner

## 2020-03-04 VITALS — BP 120/76 | Temp 97.2°F | Wt 159.0 lb

## 2020-03-04 DIAGNOSIS — E559 Vitamin D deficiency, unspecified: Secondary | ICD-10-CM | POA: Diagnosis not present

## 2020-03-04 DIAGNOSIS — F3341 Major depressive disorder, recurrent, in partial remission: Secondary | ICD-10-CM

## 2020-03-04 DIAGNOSIS — I5032 Chronic diastolic (congestive) heart failure: Secondary | ICD-10-CM

## 2020-03-04 DIAGNOSIS — K219 Gastro-esophageal reflux disease without esophagitis: Secondary | ICD-10-CM

## 2020-03-04 DIAGNOSIS — E782 Mixed hyperlipidemia: Secondary | ICD-10-CM | POA: Diagnosis not present

## 2020-03-04 DIAGNOSIS — I700 Atherosclerosis of aorta: Secondary | ICD-10-CM

## 2020-03-04 DIAGNOSIS — Z79899 Other long term (current) drug therapy: Secondary | ICD-10-CM

## 2020-03-04 DIAGNOSIS — F988 Other specified behavioral and emotional disorders with onset usually occurring in childhood and adolescence: Secondary | ICD-10-CM | POA: Diagnosis not present

## 2020-03-04 DIAGNOSIS — E039 Hypothyroidism, unspecified: Secondary | ICD-10-CM

## 2020-03-04 DIAGNOSIS — I10 Essential (primary) hypertension: Secondary | ICD-10-CM | POA: Diagnosis not present

## 2020-03-04 DIAGNOSIS — J432 Centrilobular emphysema: Secondary | ICD-10-CM | POA: Diagnosis not present

## 2020-03-04 DIAGNOSIS — R7309 Other abnormal glucose: Secondary | ICD-10-CM | POA: Diagnosis not present

## 2020-03-04 DIAGNOSIS — N1832 Chronic kidney disease, stage 3b: Secondary | ICD-10-CM | POA: Diagnosis not present

## 2020-03-04 DIAGNOSIS — K580 Irritable bowel syndrome with diarrhea: Secondary | ICD-10-CM

## 2020-03-04 MED ORDER — PANTOPRAZOLE SODIUM 40 MG PO TBEC: 40.0000 mg | DELAYED_RELEASE_TABLET | Freq: Every day | ORAL | 1 refills | Status: DC

## 2020-03-04 NOTE — Progress Notes (Addendum)
FOLLOW UP 3 MONTH  Assessment:   Diagnoses and all orders for this visit:  [name] was seen today for follow-up.   Essential hypertension Continue current medications: Atenolol 100mg  daily.  Monitor blood pressure at home; call if consistently over 130/80 Continue DASH diet.   Reminder to go to the ER if any CP, SOB, nausea, dizziness, severe HA, changes vision/speech, left arm numbness and tingling and jaw pain. -     CBC with Differential/Platelet -     COMPLETE METABOLIC PANEL WITH GFR  Hyperlipidemia, mixed Discussed dietary and exercise modifications Low fat diet -     Lipid panel  Atherosclerosis of aorta (HCC) Per Ct 2020 Control blood pressure, cholesterol, glucose, increase exercise.   Chronic diastolic CHF (congestive heart failure) (HCC) No further follow up, Cardiology 11/2019 Induced from illness, discussed continued monitoring Allergy to ACE No fluid pill at this time Disease process and medications discussed. Questions answered fully. Emphasized salt restriction, less than 2000mg  a day. Encouraged daily monitoring of the patient's weight, call office if 5 lb weight loss or gain in a day.  Encouraged regular exercise. If any increasing shortness of breath, swelling, or chest pressure go to ER immediately.  decrease your fluid intake to less than 2 L daily  Hypothyroidism, unspecified type Taking levothyroxine 125 mcg daily Reminder to take on an empty stomach 30-25mins before first meal of the day. No antacid medications for 4 hours. -     TSH  Vitamin D deficiency Continue supplementation to maintain goal of 70-100 Taking Vitamin D 5,000 IU daily Defer vitamin D level   Abnormal glucose Discussed dietary and exercise modifications  Attention deficit disorder (ADD) without hyperactivity Doing well at this time  Recurrent major depressive disorder, in partial remission (HCC) Continue medications: Sertraline 100mg  and bupropion 150mg  Discussed  stress management techniques  Discussed, increase water,intake & good sleep hygiene  Discussed increasing exercise & vegetables in diet   Chronic renal impairment, stage 3b (HCC) Increase fluids  Avoid NSAIDS Blood pressure control Monitor sugars  Will continue to monitor  Centrilobular emphysema ([place]) Per CT 2020, denies sx, monitor   Irritable bowel syndrome with diarrhea Has levsin PRN Doing well at this time  GERD Doing well at this time Continue: famotidine 20mg  BID, having breakthrough off pantropazole.  Will sent Rx today 40mg  daily. Diet discussed Monitor for triggers Avoid food with high acid content Avoid excessive cafeine Increase water intake  Medication management Continued  Over 20 minutes of face to fae interview exam, counseling, chart review and critical decision making was performed Future Appointments  Date Time Provider [place]  06/14/2020  9:30 AM Liane Comber, NP [facility] None      Subjective:  [name] is a 69 y.o. female who presents for 3 month follow up.   She follow with [name] and hre last VO 01/2020.  GFR 43.  [name]. Reports recurrent UTIs, is on topical estrogen but unsure what dose.   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   In November 2020,  patient had removal if breast implants and had post-op complications post intubation neck & facial swelling with fever & drop in O2 to 79% and all attributed to secondary to cricopharyngeal inflammation.  At this time she underwent Echo 03/10/2019 LVEF estimated 55-60%, Abnormal (paradoxical) septal  motion, consistent with left bundle branch block (long history of this on EKG), Grade I diastolic dysfunction, she also had some elevated troponins felt to be demand related and was recommended for outpatient  stress test, patient states will schedule follow up.   she has a diagnosis of depression/anxiety/OCD and is currently on wellbutrin, sertraline and xanax 0.5 mg TID PRN, reports symptoms are well controlled on current regimen. she currently reports using xanax Very rarely - she reports she hasn't used in months.   BMI is Body mass index is 28.17 kg/m., she has been working on diet, admits minimal exercise.  Wt Readings from Last 3 Encounters:  03/04/20 159 lb (72.1 kg)  01/08/20 160 lb (72.6 kg)  12/07/19 161 lb 6.4 oz (73.2 kg)   She has aortic atherosclerosis and centrilobular emphysema per CT chest 02/2019, former smoker, quit in 2004, 40 pack year history.  LBBB followed by [name].  She had episode of CHF exacerbated by sickness.   Her blood pressure has been controlled at home, today their BP is BP: 120/76 She does not workout but is doing lots of intense housework. She denies chest pain, shortness of breath, dizziness. Endorses some intermittent fatigue, on atenolol 100 mg with LBBB, last EKG showed brady 46, wonders if this may be contributing.   She is not on cholesterol medication, mild elevations treated by lifestyle modification. Her cholesterol is not at goal. The cholesterol last visit was:   Lab Results  Component Value Date   CHOL 220 (H) 11/29/2019   HDL 65 11/29/2019   LDLCALC 135 (H) 11/29/2019   TRIG 96 11/29/2019   CHOLHDL 3.4 11/29/2019    She has been working on diet and exercise for glucose management, and denies increased appetite, nausea, paresthesia of the feet, polydipsia, polyuria, visual disturbances and vomiting. Last A1C in the office was:  Lab Results  Component Value Date   HGBA1C 5.5 11/29/2019   She is on thyroid medication. Her medication was not changed last visit, taking 125 mcg daily  Lab Results  Component Value Date   TSH 1.71 11/29/2019   She is followed by nephrology [name]  Lab Results  Component Value Date    QXIHWTUU 82 (L) 11/03/2019   Patient is on Vitamin D supplement and at goal at recent check:  Lab Results  Component Value Date   VD25OH 75 05/25/2019      Medication Review: Current Outpatient Medications on File Prior to Visit  Medication Sig Dispense Refill  . albuterol (VENTOLIN HFA) 108 (90 Base) MCG/ACT inhaler Inhale 1-2 puffs into the lungs every 4 (four) hours as needed for wheezing or shortness of breath. 18 g 1  . ALPRAZolam (XANAX) 0.5 MG tablet TAKE 1/2 TO 1 TABLET BY MOUTH THREE TIMES A DAY IF NEEDED FOR ANXIETY OR SLEEP (Patient taking differently: Take 0.25-0.5 mg by mouth 3 (three) times daily as needed for anxiety or sleep. ) 90 tablet 0  . ascorbic acid (VITAMIN C) 250 MG tablet Take by mouth.    Marland Kitchen aspirin EC 81 MG tablet Take 81 mg by mouth daily.     Marland Kitchen atenolol (TENORMIN) 100 MG tablet TAKE 1 TABLET BY MOUTH ONCE DAILY FOR BP 90 tablet 3  . Budeson-Glycopyrrol-Formoterol (BREZTRI AEROSPHERE) 160-9-4.8 MCG/ACT AERO Inhale 1 puff into the lungs 2 (two) times daily. 10.7 g 2  . buPROPion (WELLBUTRIN XL) 150 MG 24 hr tablet Take 3 tablets (450 mg) every Morning for Mood & Depression 270  tablet 3  . Cholecalciferol (VITAMIN D-3) 5000 UNITS TABS Take 5,000 Units by mouth daily.     Marland Kitchen docusate sodium (COLACE) 100 MG capsule Take 100 mg by mouth 2 (two) times daily as needed for mild constipation.    . famotidine (PEPCID) 20 MG tablet Take 1 tablet 2 x  /day for Indigestion & Heartburn 180 tablet 3  . hyoscyamine (LEVBID) 0.375 MG 12 hr tablet Take 1 tablet 2 x /day for IBS 180 tablet 1  . hyoscyamine (LEVSIN SL) 0.125 MG SL tablet Dissolve 1 tablet under tongue every 4 hours if Needed for Nausea, Bloating, Cramping or Diarrhea 60 tablet 1  . ipratropium-albuterol (DUONEB) 0.5-2.5 (3) MG/3ML SOLN USE 1 VIAL IN NEBULIZER EVERY 4 HOURS AS NEEDED **MAX 6 DOSES A DAY** 540 mL 0  . levothyroxine (SYNTHROID) 125 MCG tablet Take 1 tablet daily on an empty stomach with only water for 30  minutes & no Antacid meds, Calcium or Magnesium for 4 hours & avoid Biotin 90 tablet 3  . Magnesium 250 MG TABS Take 250 mg by mouth daily.    . ondansetron (ZOFRAN) 8 MG tablet TAKE 1 TABLET BY MOUTH 3 TIMES A DAY AS NEEDED FOR NAUSEA 60 tablet 2  . promethazine (PHENERGAN) 25 MG tablet Take 1 tablet (25 mg total) by mouth every 6 (six) hours as needed for nausea or vomiting. 60 tablet 3  . sertraline (ZOLOFT) 100 MG tablet TAKE 1 TABLET DAILY FOR MOOD / DEPRESSION 90 tablet 3  . pantoprazole (PROTONIX) 40 MG tablet Take 1 tablet (40 mg total) by mouth daily. (Patient not taking: Reported on 03/04/2020) 30 tablet 1   No current facility-administered medications on file prior to visit.    Allergies  Allergen Reactions  . Ace Inhibitors Other (See Comments)    Acute renal failure (lisinopril)  . Cephalexin Anaphylaxis  . Dilaudid [Hydromorphone Hcl] Anaphylaxis  . Robaxin [Methocarbamol] Anaphylaxis  . Macrobid [Nitrofurantoin] Other (See Comments)    Causes hyperthermia   . Neurontin [Gabapentin]     Confusion   . Codeine Nausea Only  . Latex Rash  . Sulfa Antibiotics Other (See Comments)    Do not take per nephrology    Current Problems (verified) Patient Active Problem List   Diagnosis Date Noted  . Chronic bronchitis ([place]) 11/29/2019  . Ascending aorta dilatation (HCC) 11/03/2019  . Former smoker 08/30/2019  . Atherosclerosis of aorta ([place]) 03/22/2019  . Emphysema lung ([place])   . CHF NYHA class II, acute, diastolic ([place]) 74/25/9563  . Osteopenia 01/16/2019  . History of colon polyps 06/28/2018  . CKD Stage 3 ([place]) 01/10/2015  . IBS  01/10/2015  . Hyperlipidemia, mixed 06/12/2014  . Medication management 06/12/2014  . LBBB (left bundle branch block) 07/24/2013  . HTN (hypertension) 07/24/2013  . Recurrent major depression ([place])   . OCD (obsessive compulsive disorder)   . Abnormal glucose   . Migraines   . Vitamin D deficiency   . Hypothyroidism 04/05/2013     Screening Tests Immunization History  Administered Date(s) Administered  . Influenza, High Dose Seasonal PF 03/22/2017, 02/16/2018, 03/22/2019  . Influenza-Unspecified 01/31/2016  . PFIZER SARS-COV-2 Vaccination 07/05/2019, 07/26/2019  . Pneumococcal Conjugate-13 03/22/2017  . Pneumococcal Polysaccharide-23 02/12/1999, 06/29/2018  . Td 04/27/2013  . Zoster 11/24/2005    Preventative care: Last colonoscopy: 10/2012 - due f/u 10/2017 Dr. Fuller Plan, patient will schedule  Last EGD: 02/2019 Last mammogram: 11/2018 Last breast MRI: 10/2018 Last pap smear/pelvic exam: 2018, s/p total  hysterectomy, DONE  DEXA: 12/2018, R fem T -2.1   former smoker, 40 pack year, quit 2004 Last CT chest 02/2019 - Last CXR 02/2019  Last ECHO: 02/2019   Prior vaccinations: TD or Tdap: 2015  Influenza: 01/2020 Pneumococcal: 2000, 2020 Prevnar13:  2018 Shingles/Zostavax: 2007 Covid 19: Pfizer complete: 06/2019, plans to get booster this week.  Names of Other Physician/Practitioners you currently use: 1. [facility] here for primary care 2. Dr. Delman Cheadle, eye doctor, last visit 2021 3. Dr. Woodfin Ganja, dentist, last visit 2020  Patient Care Team: Unk [name], MD as PCP - General (Internal Medicine) Josue Hector, MD as PCP - Cardiology (Cardiology) Rockey Situ (Urology) Donato Heinz, MD as Consulting Physician (Nephrology) Melissa Noon, [place] as Referring Physician (Optometry) Ladene Artist, MD as Consulting Physician (Gastroenterology) Josue Hector, MD as Consulting Physician (Cardiology)  SURGICAL HISTORY She  has a past surgical history that includes Tonsillectomy; Oophorectomy; Bunionectomy; Hemorrhoid surgery; Breast enhancement surgery; Colon surgery; ORIF ankle fracture (Left, 08/30/2017); Augmentation mammaplasty; Breast implant removal (Bilateral, 03/07/2019); Esophagogastroduodenoscopy (egd) with propofol (N/A, 03/12/2019); and Colonoscopy  (2014). FAMILY HISTORY Her family history includes Colon polyps in her mother. SOCIAL HISTORY She  reports that she quit smoking about 17 years ago. Her smoking use included cigarettes. She has a 40.00 pack-year smoking history. She has never used smokeless tobacco. She reports that she does not drink alcohol and does not use drugs.   MEDICARE WELLNESS OBJECTIVES: Physical activity:   Cardiac risk factors:   Depression/mood screen:   Depression screen [facility] 2/9 11/29/2019  Decreased Interest 0  Down, Depressed, Hopeless 0  PHQ - 2 Score 0  Altered sleeping -  Tired, decreased energy -  Change in appetite -  Feeling bad or failure about yourself  -  Trouble concentrating -  Moving slowly or fidgety/restless -  Suicidal thoughts -  PHQ-9 Score -  Difficult doing work/chores -  Some recent data might be hidden    ADLs:  In your present state of health, do you have any difficulty performing the following activities: 11/29/2019 08/30/2019  Hearing? N N  Vision? N N  Difficulty concentrating or making decisions? N N  Walking or climbing stairs? N N  Dressing or bathing? N N  Doing errands, shopping? N N  Some recent data might be hidden     Cognitive Testing  Alert? Yes  Normal Appearance?Yes  Oriented to person? Yes  Place? Yes   Time? Yes  Recall of three objects?  Yes  Can perform simple calculations? Yes  Displays appropriate judgment?Yes  Can read the correct time from a watch face?Yes  EOL planning:    Review of Systems  Constitutional: Negative for malaise/fatigue and weight loss.  HENT: Negative for hearing loss and tinnitus.   Eyes: Negative for blurred vision and double vision.  Respiratory: Negative for cough, shortness of breath and wheezing.   Cardiovascular: Negative for chest pain, palpitations, orthopnea, claudication and leg swelling.  Gastrointestinal: Negative for abdominal pain, blood in stool, constipation, diarrhea, heartburn, melena, nausea and vomiting.   Genitourinary: Negative.   Musculoskeletal: Negative for falls, joint pain and myalgias.  Skin: Negative for rash.  Neurological: Negative for dizziness, tingling, sensory change, weakness and headaches.  Endo/Heme/Allergies: Negative for polydipsia.  Psychiatric/Behavioral: Positive for depression. Negative for memory loss, substance abuse and suicidal ideas. The patient is not nervous/anxious and does not have insomnia.   All other systems reviewed and are negative.    Objective:  Today's Vitals   03/04/20 0850  BP: 120/76  Temp: (!) 97.2 F (36.2 C)  Weight: 159 lb (72.1 kg)   Body mass index is 28.17 kg/m.  General appearance: alert, no distress, WD/WN, female HEENT: normocephalic, sclerae anicteric, TMs pearly, nares patent, no discharge or erythema, pharynx normal Oral cavity: MMM, no lesions Neck: supple, no lymphadenopathy, no thyromegaly, no masses Heart: RRR, normal S1, S2, no murmurs Lungs: CTA bilaterally, no wheezes, rhonchi, or rales Abdomen: +bs, soft, non tender, non distended, no masses, no hepatomegaly, no splenomegaly Musculoskeletal: nontender, no swelling, no obvious deformity Extremities: no edema, no cyanosis, no clubbing - left toe injected with healing scabs Pulses: 2+ symmetric, upper and lower extremities, normal cap refill Neurological: alert, oriented x 3, CN2-12 intact, strength normal upper extremities and lower extremities, sensation normal throughout, DTRs 2+ throughout, no cerebellar signs, gait normal Psychiatric: normal affect, behavior normal, pleasant   Medicare Attestation I have personally reviewed: The patient's medical and social history Their use of alcohol, tobacco or illicit drugs Their current medications and supplements The patient's functional ability including ADLs,fall risks, home safety risks, cognitive, and hearing and visual impairment Diet and physical activities Evidence for depression or mood disorders  The  patient's weight, height, BMI, and visual acuity have been recorded in the chart.  I have made referrals, counseling, and provided education to the patient based on review of the above and I have provided the patient with a written personalized care plan for preventive services.     Garnet Sierras, NP   03/04/2020

## 2020-03-05 LAB — COMPLETE METABOLIC PANEL WITH GFR
AG Ratio: 1.4 (calc) (ref 1.0–2.5)
ALT: 12 U/L (ref 6–29)
AST: 17 U/L (ref 10–35)
Albumin: 3.8 g/dL (ref 3.6–5.1)
Alkaline phosphatase (APISO): 63 U/L (ref 37–153)
BUN/Creatinine Ratio: 21 (calc) (ref 6–22)
BUN: 24 mg/dL (ref 7–25)
CO2: 29 mmol/L (ref 20–32)
Calcium: 9.7 mg/dL (ref 8.6–10.4)
Chloride: 103 mmol/L (ref 98–110)
Creat: 1.15 mg/dL — ABNORMAL HIGH (ref 0.50–0.99)
GFR, Est African American: 57 mL/min/{1.73_m2} — ABNORMAL LOW (ref >=60)
GFR, Est Non African American: 49 mL/min/{1.73_m2} — ABNORMAL LOW (ref >=60)
Globulin: 2.7 g/dL (calc) (ref 1.9–3.7)
Glucose, Bld: 88 mg/dL (ref 65–99)
Potassium: 4.5 mmol/L (ref 3.5–5.3)
Sodium: 139 mmol/L (ref 135–146)
Total Bilirubin: 0.4 mg/dL (ref 0.2–1.2)
Total Protein: 6.5 g/dL (ref 6.1–8.1)

## 2020-03-05 LAB — CBC WITH DIFFERENTIAL/PLATELET
Absolute Monocytes: 410 cells/uL (ref 200–950)
Basophils Absolute: 32 cells/uL (ref 0–200)
Basophils Relative: 0.7 %
Eosinophils Absolute: 428 cells/uL (ref 15–500)
Eosinophils Relative: 9.5 %
HCT: 41 % (ref 35.0–45.0)
Hemoglobin: 13.8 g/dL (ref 11.7–15.5)
Lymphs Abs: 1589 cells/uL (ref 850–3900)
MCH: 31.1 pg (ref 27.0–33.0)
MCHC: 33.7 g/dL (ref 32.0–36.0)
MCV: 92.3 fL (ref 80.0–100.0)
MPV: 10.2 fL (ref 7.5–12.5)
Monocytes Relative: 9.1 %
Neutro Abs: 2043 cells/uL (ref 1500–7800)
Neutrophils Relative %: 45.4 %
Platelets: 302 10*3/uL (ref 140–400)
RBC: 4.44 10*6/uL (ref 3.80–5.10)
RDW: 12.3 % (ref 11.0–15.0)
Total Lymphocyte: 35.3 %
WBC: 4.5 10*3/uL (ref 3.8–10.8)

## 2020-03-05 LAB — LIPID PANEL
Cholesterol: 186 mg/dL (ref <200)
HDL: 66 mg/dL (ref >=50)
LDL Cholesterol (Calc): 103 mg/dL (calc) — ABNORMAL HIGH
Non-HDL Cholesterol (Calc): 120 mg/dL (calc) (ref <130)
Total CHOL/HDL Ratio: 2.8 (calc) (ref <5.0)
Triglycerides: 79 mg/dL (ref <150)

## 2020-03-05 LAB — TSH: TSH: 0.13 mIU/L — ABNORMAL LOW (ref 0.40–4.50)

## 2020-04-16 MED FILL — FAMOTIDINE 20 MG TABLET: 20 | 30 days supply | Qty: 60 | Fill #6

## 2020-05-20 ENCOUNTER — Encounter: Payer: PPO | Admitting: Internal Medicine

## 2020-05-22 ENCOUNTER — Other Ambulatory Visit: Payer: Self-pay | Admitting: Internal Medicine

## 2020-05-22 DIAGNOSIS — E039 Hypothyroidism, unspecified: Secondary | ICD-10-CM

## 2020-05-22 MED ORDER — LEVOTHYROXINE SODIUM 125 MCG PO TABS: ORAL_TABLET | ORAL | 0 refills | Status: DC

## 2020-05-22 MED FILL — SERTRALINE HCL 100 MG TABS: 100 | 90 days supply | Qty: 90 | Fill #2

## 2020-05-22 MED FILL — buPROPion HCL ER (XL) 150 M: 150 | 90 days supply | Qty: 270 | Fill #1

## 2020-05-22 MED FILL — FAMOTIDINE 20 MG TABLET: 20 | 30 days supply | Qty: 60 | Fill #7

## 2020-05-23 MED FILL — LEVOTHYROXINE 125 MCG TAB: 125 | 90 days supply | Qty: 90 | Fill #0

## 2020-05-24 ENCOUNTER — Encounter: Payer: PPO | Admitting: Internal Medicine

## 2020-06-13 ENCOUNTER — Encounter: Payer: Self-pay | Admitting: Adult Health

## 2020-06-13 DIAGNOSIS — E663 Overweight: Secondary | ICD-10-CM | POA: Insufficient documentation

## 2020-06-13 NOTE — Progress Notes (Signed)
CPE  Assessment:   Diagnoses and all orders for this visit:  Encounter for annual wellness exam Due annually   Atherosclerosis of aorta ([place]) Per Ct 2020 Control blood pressure, cholesterol, glucose, increase exercise.   Centrilobular emphysema ([place]) Per CT 2020, denies sx, monitor   Essential hypertension Typically well controlled at home; monitoring closely;  Monitor blood pressure at home; call if consistently over 130/80 Continue DASH diet.   Reminder to go to the ER if any CP, SOB, nausea, dizziness, severe HA, changes vision/speech, left arm numbness and tingling and jaw pain.  LBBB (left bundle branch block) Continue to monitor- cardiology Dr. Acie Fredrickson follows   Migraine without status migrainosus, not intractable, unspecified migraine type Reports improved recently;  No significant issues or concerns   Irritable bowel syndrome, unspecified type Symptoms stable/improved - continue monitoring diet/lifestyle modification  Hypothyroidism, unspecified type continue medications the same pending lab results reminded to take on an empty stomach 30-69mins before food.  -     TSH  CKD Stage 3 (GFR 55 ml/min) Increase fluids, avoid NSAIDS, monitor sugars, will monitor Continue follow up with nephrology -     CMP/GFR  Major depression, controlled Continue medications; limit benzos; doing well  Lifestyle discussed: diet/exerise, sleep hygiene, stress management, hydration  Obsessive-compulsive disorder, unspecified type Symptoms improved on SSRI  Diverticulosis of intestine without bleeding, unspecified intestinal tract location Increase fiber intake;   Medication management -     CBC with Differential/Platelet -     CMP/GFR  Mixed hyperlipidemia Fair control by lifestyle modification only Continue low cholesterol diet and exercise.  -     Lipid panel  Other abnormal glucose Recent A1Cs at goal Discussed disease and risks Discussed diet/exercise, weight  management  -     CMP/GFR  Vitamin D deficiency At goal at recent check; continue to recommend supplementation for goal of 70-100 Defer vitamin D level  Osteopenia - get dexa q2y, plan next with mmg 2023, continue Vit D and Ca, weight bearing exercises  CHF, diastolic ([place]) Per ECHO while admitted 02/2019 No hx prior, denies notable sx since that time Dr. Johnsie Cancel recommended no dedicated follow up unless new sx Limit sodium; monitor weight and control BP  Hx of colon polyps Referral placed to GI   Former smoker, 40 pack year, quit 2004 UTD on screening, CT chest 10/2019 showed no concerning nodules recommended for follow up, do annual CXR  Orders Placed This Encounter  Procedures  . CBC with Differential/Platelet  . COMPLETE METABOLIC PANEL WITH GFR  . Magnesium  . Lipid panel  . TSH  . Hemoglobin A1c  . VITAMIN D 25 Hydroxy (Vit-D Deficiency, Fractures)  . Microalbumin / creatinine urine ratio  . Urinalysis, Routine w reflex microscopic  . Vitamin B12  . Ambulatory referral to Gastroenterology  . EKG 12-Lead    Over 40 minutes of exam, counseling, chart review and critical decision making was performed Future Appointments  Date Time Provider [place]  12/23/2020  9:30 AM Liane Comber, NP [facility] None  06/13/2021 10:00 AM Liane Comber, NP [facility] None     Plan:   During the course of the visit the patient was educated and counseled about appropriate screening and preventive services including:    Pneumococcal vaccine   Prevnar 13  Influenza vaccine  Td vaccine  Screening electrocardiogram  Bone densitometry screening  Colorectal cancer screening  Diabetes screening  Glaucoma screening  Nutrition counseling   Advanced directives: requested   Subjective:  [name]  A [name] is a 70 y.o. female who presents for CPE. She has Hypothyroidism; Recurrent major depression ([name]); OCD (obsessive compulsive disorder); Abnormal glucose;  Migraines; Vitamin D deficiency; LBBB (left bundle branch block); HTN (hypertension); Hyperlipidemia, mixed; Medication management; CKD Stage 3 ([name]); IBS ; History of colon polyps; Osteopenia; Chronic diastolic CHF (congestive heart failure) ([place]); Atherosclerosis of aorta ([name]); Emphysema lung ([name]); Former smoker; Chronic bronchitis ([name]); Overweight (BMI 25.0-29.9); and Interstitial cystitis on their problem list.  She is divorced, 3 children, 6 grandchildren, 1 great grand daughter will be 2 y/o in April.  She is a retired Marine scientist.   She has IC, no longer taking meds for this, doing food diary and has found avoiding chocolate to be most beneficial. Has seen uro but not planning on following up.   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   she has a diagnosis of depression/anxiety/OCD and is currently on wellbutrin, sertraline and xanax 0.5 mg TID PRN, reports symptoms are well controlled on current regimen. she currently reports using xanax Very rarely.  BMI is Body mass index is 28.38 kg/m., she has been working on diet, admits minimal exercise.  Wt Readings from Last 3 Encounters:  06/14/20 160 lb 3.2 oz (72.7 kg)  03/04/20 159 lb (72.1 kg)  01/08/20 160 lb (72.6 kg)   She has aortic atherosclerosis and centrilobular emphysema per CT chest 02/2019, former smoker, quit in 2004, 40 pack year history. Inhalers only need when ill. 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   In November 2020,  patient had removal if breast implants and had post-op complications post intubation neck & facial swelling with fever & drop in O2 to 79% and all attributed to secondary to cricopharyngeal inflammation.  At this time she underwent Echo 03/10/2019 LVEF estimated 55-60%, Abnormal (paradoxical) septal  motion, consistent with left bundle branch block (long history of this on EKG), Grade I diastolic dysfunctoin, she also had some elevated troponins felt to be demand related. Had follow up with Dr. Johnsie Cancel, was not recommended dedicated follow up. LBBB followed by Dr. Johnsie Cancel.    Her blood pressure has been controlled at home, today their BP is BP: (!) 98/56 She does not workout but is doing lots of intense housework. She denies chest pain, shortness of breath, dizziness.   She is not on cholesterol medication, mild elevations treated by lifestyle modification. Declines medication. Her cholesterol is not at goal. The cholesterol last visit was:   Lab Results  Component Value Date   CHOL 186 03/04/2020   HDL 66 03/04/2020   LDLCALC 103 (H) 03/04/2020   TRIG 79 03/04/2020   CHOLHDL 2.8 03/04/2020    She has been working on diet and exercise for glucose management, and denies increased appetite, nausea, paresthesia of the feet, polydipsia, polyuria, visual disturbances and vomiting. Last A1C in the office was:  Lab Results  Component Value Date   HGBA1C 5.5 11/29/2019   She is on thyroid medication. Her medication was not changed last visit, taking 125 mcg 6 days a week, skipping 1 day a week.  Lab Results  Component Value Date   TSH 0.13 (L) 03/04/2020   She is followed by nephrology Dr. Katrine Coho for CKD III Lab Results  Component Value Date  GFRNONAA 49 (L) 03/04/2020   Patient is on Vitamin D supplement and at goal at recent check:  Lab Results  Component Value Date   VD25OH 75 05/25/2019        Medication Review: Current Outpatient Medications on File Prior to Visit  Medication Sig Dispense Refill  . ALPRAZolam (XANAX) 0.5 MG tablet TAKE 1/2 TO 1 TABLET BY MOUTH THREE TIMES A DAY IF NEEDED FOR ANXIETY OR SLEEP (Patient taking differently: Take 0.25-0.5 mg by mouth 3 (three) times daily as needed for anxiety or sleep.) 90 tablet 0  . ascorbic acid (VITAMIN C) 250 MG  tablet Take by mouth.    Marland Kitchen aspirin EC 81 MG tablet Take 81 mg by mouth daily.     Marland Kitchen atenolol (TENORMIN) 100 MG tablet TAKE 1 TABLET BY MOUTH ONCE DAILY FOR BP 90 tablet 3  . buPROPion (WELLBUTRIN XL) 150 MG 24 hr tablet Take 3 tablets (450 mg) every Morning for Mood & Depression 270 tablet 3  . Cholecalciferol (VITAMIN D-3) 5000 UNITS TABS Take 5,000 Units by mouth daily.     Marland Kitchen docusate sodium (COLACE) 100 MG capsule Take 100 mg by mouth 2 (two) times daily as needed for mild constipation.    . famotidine (PEPCID) 20 MG tablet Take 1 tablet 2 x  /day for Indigestion & Heartburn 180 tablet 3  . hyoscyamine (LEVBID) 0.375 MG 12 hr tablet Take 1 tablet 2 x /day for IBS 180 tablet 1  . ipratropium-albuterol (DUONEB) 0.5-2.5 (3) MG/3ML SOLN USE 1 VIAL IN NEBULIZER EVERY 4 HOURS AS NEEDED **MAX 6 DOSES A DAY** 540 mL 0  . levothyroxine (SYNTHROID) 125 MCG tablet Take 1 tablet  Daily  on an empty stomach with only water for 30 minutes & no Antacid meds, Calcium or Magnesium for 4 hours & avoid Biotin 90 tablet 0  . Magnesium 250 MG TABS Take 250 mg by mouth daily.    . ondansetron (ZOFRAN) 8 MG tablet TAKE 1 TABLET BY MOUTH 3 TIMES A DAY AS NEEDED FOR NAUSEA 60 tablet 2  . Pomegranate, [place] granatum, (POMEGRANATE PO) Take by mouth daily.    . promethazine (PHENERGAN) 25 MG tablet Take 1 tablet (25 mg total) by mouth every 6 (six) hours as needed for nausea or vomiting. 60 tablet 3  . sertraline (ZOLOFT) 100 MG tablet TAKE 1 TABLET DAILY FOR MOOD / DEPRESSION 90 tablet 3  . TURMERIC CURCUMIN PO Take by mouth daily.    Marland Kitchen albuterol (VENTOLIN HFA) 108 (90 Base) MCG/ACT inhaler Inhale 1-2 puffs into the lungs every 4 (four) hours as needed for wheezing or shortness of breath. (Patient not taking: Reported on 06/14/2020) 18 g 1  . pantoprazole (PROTONIX) 40 MG tablet Take 1 tablet (40 mg total) by mouth daily. (Patient not taking: Reported on 06/14/2020) 90 tablet 1   No current facility-administered  medications on file prior to visit.    Allergies  Allergen Reactions  . Ace Inhibitors Other (See Comments)    Acute renal failure (lisinopril)  . Cephalexin Anaphylaxis  . Dilaudid [Hydromorphone Hcl] Anaphylaxis  . Robaxin [Methocarbamol] Anaphylaxis  . Macrobid [Nitrofurantoin] Other (See Comments)    Causes hyperthermia   . Neurontin [Gabapentin]     Confusion   . Codeine Nausea Only  . Latex Rash  . Sulfa Antibiotics Other (See Comments)    Do not take per nephrology    Current Problems (verified) Patient Active Problem List   Diagnosis Date Noted  . Interstitial  cystitis 06/14/2020  . Overweight (BMI 25.0-29.9) 06/13/2020  . Chronic bronchitis ([place]) 11/29/2019  . Former smoker 08/30/2019  . Atherosclerosis of aorta ([place]) 03/22/2019  . Emphysema lung ([place])   . Chronic diastolic CHF (congestive heart failure) ([place]) 03/11/2019  . Osteopenia 01/16/2019  . History of colon polyps 06/28/2018  . CKD Stage 3 ([employer]) 01/10/2015  . IBS  01/10/2015  . Hyperlipidemia, mixed 06/12/2014  . Medication management 06/12/2014  . LBBB (left bundle branch block) 07/24/2013  . HTN (hypertension) 07/24/2013  . Recurrent major depression ([place])   . OCD (obsessive compulsive disorder)   . Abnormal glucose   . Migraines   . Vitamin D deficiency   . Hypothyroidism 04/05/2013    Screening Tests Immunization History  Administered Date(s) Administered  . Influenza Split 01/31/2016, 05/12/2018, 03/17/2019  . Influenza, High Dose Seasonal PF 03/22/2017, 02/16/2018, 03/22/2019  . Influenza-Unspecified 01/31/2016, 02/09/2020  . PFIZER(Purple Top)SARS-COV-2 Vaccination 07/05/2019, 07/26/2019, 03/04/2020  . Pneumococcal Conjugate-13 03/22/2017, 06/29/2018  . Pneumococcal Polysaccharide-23 02/12/1999, 06/29/2018  . Td 04/27/2013  . Zoster 11/24/2005    Preventative care: Last colonoscopy: 10/2012 - due f/u 10/2017 Dr. Fuller Plan, patient will schedule Last EGD: 02/2019 Last mammogram: 11/2018  DUE, patient states will schedule Last breast MRI: 10/2018 Last pap smear/pelvic exam: 2018, s/p total hysterectomy, DONE  DEXA: 12/2018, R fem T -2.1 - will get 2023   former smoker, 40 pack year, quit 2004 Last CT chest 11/02/2019, stable nodules no follow up, emphysema, 3.8 cm ascending aorta (cardiothoracic recommended no need for follow up) Last CXR 02/2019  Last ECHO: 02/2019   Prior vaccinations: TD or Tdap: 2015  Influenza: 01/2020 Pneumococcal: 2000, 2020 Prevnar13:  2018 Shingles/Zostavax: 2007 Covid 19: 3/3, 2021, pfizerdates  Names of Other Physician/Practitioners you currently use: 1. [facility] here for primary care 2. Dr. Delman Cheadle, eye doctor, last visit 2021,  3. Dr. Woodfin Ganja, dentist, last visit 2020, wants to find a new provider who takes insurance   Patient Care Team: Unk [name], MD as PCP - General (Internal Medicine) Josue Hector, MD as PCP - Cardiology (Cardiology) Rockey Situ (Urology) Donato Heinz, MD as Consulting Physician (Nephrology) Melissa Noon, Makena as Referring Physician (Optometry) Ladene Artist, MD as Consulting Physician (Gastroenterology) Josue Hector, MD as Consulting Physician (Cardiology)  SURGICAL HISTORY She  has a past surgical history that includes Tonsillectomy; Oophorectomy; Bunionectomy; Hemorrhoid surgery; Breast enhancement surgery; Colon surgery; ORIF ankle fracture (Left, 08/30/2017); Augmentation mammaplasty; Breast implant removal (Bilateral, 03/07/2019); Esophagogastroduodenoscopy (egd) with propofol (N/A, 03/12/2019); and Colonoscopy (2014). FAMILY HISTORY Her family history includes Colon polyps in her mother; Heart block in her maternal grandmother; Muscular dystrophy in her maternal grandmother and mother. SOCIAL HISTORY She  reports that she quit smoking about 18 years ago. Her smoking use included cigarettes. She has a 40.00 pack-year smoking history. She has never  used smokeless tobacco. She reports that she does not drink alcohol and does not use drugs.  Depression/mood screen:   Depression screen [facility] 2/9 06/14/2020  Decreased Interest 0  Down, Depressed, Hopeless 0  PHQ - 2 Score 0  Altered sleeping 1  Tired, decreased energy 0  Change in appetite 0  Feeling bad or failure about yourself  0  Trouble concentrating 0  Moving slowly or fidgety/restless 0  Suicidal thoughts 0  PHQ-9 Score 1  Difficult doing work/chores Not difficult at all  Some recent data might be hidden    ADLs:  In your present state of  health, do you have any difficulty performing the following activities: 11/29/2019 08/30/2019  Hearing? N N  Vision? N N  Difficulty concentrating or making decisions? N N  Walking or climbing stairs? N N  Dressing or bathing? N N  Doing errands, shopping? N N  Some recent data might be hidden     Cognitive Testing  Alert? Yes  Normal Appearance?Yes  Oriented to person? Yes  Place? Yes   Time? Yes  Recall of three objects?  Yes  Can perform simple calculations? Yes  Displays appropriate judgment?Yes  Can read the correct time from a watch face?Yes  EOL planning:     Review of Systems  Constitutional: Negative for malaise/fatigue and weight loss.  HENT: Negative for hearing loss and tinnitus.   Eyes: Negative for blurred vision and double vision.  Respiratory: Negative for cough, shortness of breath and wheezing.   Cardiovascular: Negative for chest pain, palpitations, orthopnea, claudication and leg swelling.  Gastrointestinal: Negative for abdominal pain, blood in stool, constipation, diarrhea, heartburn, melena, nausea and vomiting.  Genitourinary: Negative.   Musculoskeletal: Negative for falls, joint pain and myalgias.  Skin: Negative for rash.  Neurological: Negative for dizziness, tingling, sensory change, weakness and headaches.  Endo/Heme/Allergies: Negative for polydipsia.  Psychiatric/Behavioral: Negative for  depression, memory loss, substance abuse and suicidal ideas. The patient is not nervous/anxious and does not have insomnia.   All other systems reviewed and are negative.    Objective:     Today's Vitals   06/14/20 0935  BP: (!) 98/56  Pulse: 60  Temp: (!) 97.5 F (36.4 C)  SpO2: 97%  Weight: 160 lb 3.2 oz (72.7 kg)  Height: 5\' 3"  (1.6 m)   Body mass index is 28.38 kg/m.  General appearance: alert, no distress, WD/WN, female HEENT: normocephalic, sclerae anicteric, TMs pearly, nares patent, no discharge or erythema, pharynx normal Oral cavity: MMM, no lesions Neck: supple, no lymphadenopathy, no thyromegaly, no masses Heart: RRR, normal S1, S2, no murmurs Lungs: CTA bilaterally, no wheezes, rhonchi, or rales Abdomen: +bs, soft, non tender, non distended, no masses, no hepatomegaly, no splenomegaly Musculoskeletal: nontender, no swelling, no obvious deformity Extremities: no edema, no cyanosis, no clubbing - left toe injected with healing scabs Pulses: 2+ symmetric, upper and lower extremities, normal cap refill Neurological: alert, oriented x 3, CN2-12 intact, strength normal upper extremities and lower extremities, sensation normal throughout, DTRs 2+ throughout, no cerebellar signs, gait normal Psychiatric: normal affect, behavior normal, pleasant  GU: declines Breasts: declines, no concerns, will schedule mammogram   EKG: LBBB, unchanged  The patient's weight, height, BMI, and visual acuity have been recorded in the chart.  I have made referrals, counseling, and provided education to the patient based on review of the above and I have provided the patient with a written personalized care plan for preventive services.     Izora Ribas, NP   06/14/2020

## 2020-06-14 ENCOUNTER — Ambulatory Visit (INDEPENDENT_AMBULATORY_CARE_PROVIDER_SITE_OTHER): Payer: PPO | Admitting: Adult Health

## 2020-06-14 ENCOUNTER — Other Ambulatory Visit: Payer: Self-pay

## 2020-06-14 ENCOUNTER — Encounter: Payer: Self-pay | Admitting: Adult Health

## 2020-06-14 VITALS — BP 98/56 | HR 60 | Temp 97.5°F | Ht 63.0 in | Wt 160.2 lb

## 2020-06-14 DIAGNOSIS — Z0000 Encounter for general adult medical examination without abnormal findings: Secondary | ICD-10-CM

## 2020-06-14 DIAGNOSIS — R7309 Other abnormal glucose: Secondary | ICD-10-CM | POA: Diagnosis not present

## 2020-06-14 DIAGNOSIS — Z136 Encounter for screening for cardiovascular disorders: Secondary | ICD-10-CM

## 2020-06-14 DIAGNOSIS — J42 Unspecified chronic bronchitis: Secondary | ICD-10-CM

## 2020-06-14 DIAGNOSIS — K580 Irritable bowel syndrome with diarrhea: Secondary | ICD-10-CM

## 2020-06-14 DIAGNOSIS — E782 Mixed hyperlipidemia: Secondary | ICD-10-CM | POA: Diagnosis not present

## 2020-06-14 DIAGNOSIS — Z131 Encounter for screening for diabetes mellitus: Secondary | ICD-10-CM

## 2020-06-14 DIAGNOSIS — E559 Vitamin D deficiency, unspecified: Secondary | ICD-10-CM

## 2020-06-14 DIAGNOSIS — N1831 Chronic kidney disease, stage 3a: Secondary | ICD-10-CM

## 2020-06-14 DIAGNOSIS — I447 Left bundle-branch block, unspecified: Secondary | ICD-10-CM | POA: Diagnosis not present

## 2020-06-14 DIAGNOSIS — K589 Irritable bowel syndrome without diarrhea: Secondary | ICD-10-CM

## 2020-06-14 DIAGNOSIS — Z79899 Other long term (current) drug therapy: Secondary | ICD-10-CM | POA: Diagnosis not present

## 2020-06-14 DIAGNOSIS — E663 Overweight: Secondary | ICD-10-CM

## 2020-06-14 DIAGNOSIS — I700 Atherosclerosis of aorta: Secondary | ICD-10-CM | POA: Diagnosis not present

## 2020-06-14 DIAGNOSIS — I5032 Chronic diastolic (congestive) heart failure: Secondary | ICD-10-CM

## 2020-06-14 DIAGNOSIS — F429 Obsessive-compulsive disorder, unspecified: Secondary | ICD-10-CM

## 2020-06-14 DIAGNOSIS — E538 Deficiency of other specified B group vitamins: Secondary | ICD-10-CM

## 2020-06-14 DIAGNOSIS — Z86010 Personal history of colonic polyps: Secondary | ICD-10-CM

## 2020-06-14 DIAGNOSIS — Z87891 Personal history of nicotine dependence: Secondary | ICD-10-CM

## 2020-06-14 DIAGNOSIS — N3010 Interstitial cystitis (chronic) without hematuria: Secondary | ICD-10-CM | POA: Insufficient documentation

## 2020-06-14 DIAGNOSIS — M8580 Other specified disorders of bone density and structure, unspecified site: Secondary | ICD-10-CM

## 2020-06-14 DIAGNOSIS — Z1211 Encounter for screening for malignant neoplasm of colon: Secondary | ICD-10-CM

## 2020-06-14 DIAGNOSIS — E039 Hypothyroidism, unspecified: Secondary | ICD-10-CM | POA: Diagnosis not present

## 2020-06-14 DIAGNOSIS — G43909 Migraine, unspecified, not intractable, without status migrainosus: Secondary | ICD-10-CM

## 2020-06-14 DIAGNOSIS — J432 Centrilobular emphysema: Secondary | ICD-10-CM

## 2020-06-14 DIAGNOSIS — F3341 Major depressive disorder, recurrent, in partial remission: Secondary | ICD-10-CM

## 2020-06-14 DIAGNOSIS — I10 Essential (primary) hypertension: Secondary | ICD-10-CM

## 2020-06-14 MED ORDER — HYOSCYAMINE SULFATE 0.125 MG SL SUBL
SUBLINGUAL_TABLET | SUBLINGUAL | 1 refills | Status: DC
Start: 2020-06-14 — End: 2020-12-23

## 2020-06-14 NOTE — Patient Instructions (Addendum)
[name] , Thank you for taking time to come for your Annual Wellness Visit. I appreciate your ongoing commitment to your health goals. Please review the following plan we discussed and let me know if I can assist you in the future.   These are the goals we discussed: Goals    . Blood Pressure < 130/80    . Exercise 150 min/wk Moderate Activity    . LDL CALC < 100       This is a list of the screening recommended for you and due dates:  Health Maintenance  Topic Date Due  . Colon Cancer Screening  11/16/2017  . Mammogram  11/30/2020  . Tetanus Vaccine  04/28/2023  . Flu Shot  Completed  . DEXA scan (bone density measurement)  Completed  . COVID-19 Vaccine  Completed  .  Hepatitis C: One time screening is recommended by Center for Disease Control  (CDC) for  adults born from [birth year] through 1964.   Completed  . Pneumonia vaccines  Completed    Check with insurance about shingrix -     HOW TO SCHEDULE A MAMMOGRAM  [facility]  7 a.m.-6:30 p.m., Monday 7 a.m.-5 p.m., Tuesday-Friday Schedule an appointment by calling [phone].  [facility] Schedule an appointment by calling [phone].     IF you do calcium supplement - do low dose only 300 once or twice a day, but get supplement with vitamin K2 to avoid building plaque  EXERCISE is very important    Osteopenia  Osteopenia is a loss of thickness (density) inside the bones. Another name for osteopenia is low bone mass. Mild osteopenia is a normal part of aging. It is not a disease, and it does not cause symptoms. However, if you have osteopenia and continue to lose bone mass, you could develop a condition that causes the bones to become thin and break more easily (osteoporosis). Osteoporosis can cause you to lose some height, have back pain, and have a stooped posture. Although osteopenia is not a disease, making changes to your lifestyle and diet can help to prevent osteopenia from  developing into osteoporosis. What are the causes? Osteopenia is caused by loss of calcium in the bones. Bones are constantly changing. Old bone cells are continually being replaced with new bone cells. This process builds new bone. The mineral calcium is needed to build new bone and maintain bone density. Bone density is usually highest around age 4. After that, most people's bodies cannot replace all the bone they have lost with new bone. What increases the risk? You are more likely to develop this condition if:  You are older than age 51.  You are a woman who went through menopause early.  You have a long illness that keeps you in bed.  You do not get enough exercise.  You lack certain nutrients (malnutrition).  You have an overactive thyroid gland (hyperthyroidism).  You use products that contain nicotine or tobacco, such as cigarettes, e-cigarettes and chewing tobacco, or you drink a lot of alcohol.  You are taking medicines that weaken the bones, such as steroids. What are the signs or symptoms? This condition does not cause any symptoms. You may have a slightly higher risk for bone breaks (fractures), so getting fractures more easily than normal may be an indication of osteopenia. How is this diagnosed? This condition may be diagnosed based on an X-ray exam that measures bone density (dual-energy X-ray absorptiometry, or DEXA). This test  can measure bone density in your hips, spine, and wrists. Osteopenia has no symptoms, so this condition is usually diagnosed after a routine bone density screening test is done for osteoporosis. This routine screening is usually done for:  Women who are age 34 or older.  Men who are age 3 or older. If you have risk factors for osteopenia, you may have the screening test at an earlier age. How is this treated? Making dietary and lifestyle changes can lower your risk for osteoporosis. If you have severe osteopenia that is close to becoming  osteoporosis, this condition can be treated with medicines and dietary supplements such as calcium and vitamin D. These supplements help to rebuild bone density. Follow these instructions at home: Eating and drinking Eat a diet that is high in calcium and vitamin D.  Calcium is found in dairy products, beans, salmon, and leafy green vegetables like spinach and broccoli.  Look for foods that have vitamin D and calcium added to them (fortified foods), such as orange juice, cereal, and bread.   Lifestyle  Do 30 minutes or more of a weight-bearing exercise every day, such as walking, jogging, or playing a sport. These types of exercises strengthen the bones.  Do not use any products that contain nicotine or tobacco, such as cigarettes, e-cigarettes, and chewing tobacco. If you need help quitting, ask your health care provider.  Do not drink alcohol if: ? Your health care provider tells you not to drink. ? You are pregnant, may be pregnant, or are planning to become pregnant.  If you drink alcohol: ? Limit how much you use to:  0-1 drink a day for women.  0-2 drinks a day for men. ? Be aware of how much alcohol is in your drink. In the [place] one drink equals one 12 oz bottle of beer (355 mL), one 5 oz glass of wine (148 mL), or one 1 oz glass of hard liquor (44 mL). General instructions  Take over-the-counter and prescription medicines only as told by your health care provider. These include vitamins and supplements.  Take precautions at home to lower your risk of falling, such as: ? Keeping rooms well-lit and free of clutter, such as cords. ? Installing safety rails on stairs. ? Using rubber mats in the bathroom or other areas that are often wet or slippery.  Keep all follow-up visits. This is important. Contact a health care provider if:  You have not had a bone density screening for osteoporosis and you are: ? A woman who is age 96 or older. ? A man who is age 80 or  older.  You are a postmenopausal woman who has not had a bone density screening for osteoporosis.  You are older than age 84 and you want to know if you should have bone density screening for osteoporosis. Summary  Osteopenia is a loss of thickness (density) inside the bones. Another name for osteopenia is low bone mass.  Osteopenia is not a disease, but it may increase your risk for a condition that causes the bones to become thin and break more easily (osteoporosis).  You may be at risk for osteopenia if you are older than age 68 or if you are a woman who went through early menopause.  Osteopenia does not cause any symptoms, but it can be diagnosed with a bone density screening test.  Dietary and lifestyle changes are the first treatment for osteopenia. These may lower your risk for osteoporosis. This information is not intended  to replace advice given to you by your health care provider. Make sure you discuss any questions you have with your health care provider. Document Revised: 09/28/2019 Document Reviewed: 09/28/2019 Elsevier Patient Education  [place].

## 2020-06-15 ENCOUNTER — Other Ambulatory Visit: Payer: Self-pay | Admitting: Adult Health

## 2020-06-15 DIAGNOSIS — E039 Hypothyroidism, unspecified: Secondary | ICD-10-CM

## 2020-06-15 LAB — LIPID PANEL
Cholesterol: 189 mg/dL (ref <200)
HDL: 63 mg/dL (ref >=50)
LDL Cholesterol (Calc): 110 mg/dL (calc) — ABNORMAL HIGH
Non-HDL Cholesterol (Calc): 126 mg/dL (calc) (ref <130)
Total CHOL/HDL Ratio: 3 (calc) (ref <5.0)
Triglycerides: 73 mg/dL (ref <150)

## 2020-06-15 LAB — HEMOGLOBIN A1C
Hgb A1c MFr Bld: 5.4 % of total Hgb (ref <5.7)
Mean Plasma Glucose: 108 mg/dL
eAG (mmol/L): 6 mmol/L

## 2020-06-15 LAB — CBC WITH DIFFERENTIAL/PLATELET
Absolute Monocytes: 342 cells/uL (ref 200–950)
Basophils Absolute: 32 cells/uL (ref 0–200)
Basophils Relative: 0.7 %
Eosinophils Absolute: 392 cells/uL (ref 15–500)
Eosinophils Relative: 8.7 %
HCT: 39.7 % (ref 35.0–45.0)
Hemoglobin: 13.1 g/dL (ref 11.7–15.5)
Lymphs Abs: 1472 cells/uL (ref 850–3900)
MCH: 30.2 pg (ref 27.0–33.0)
MCHC: 33 g/dL (ref 32.0–36.0)
MCV: 91.5 fL (ref 80.0–100.0)
MPV: 10.6 fL (ref 7.5–12.5)
Monocytes Relative: 7.6 %
Neutro Abs: 2264 cells/uL (ref 1500–7800)
Neutrophils Relative %: 50.3 %
Platelets: 299 10*3/uL (ref 140–400)
RBC: 4.34 10*6/uL (ref 3.80–5.10)
RDW: 13.2 % (ref 11.0–15.0)
Total Lymphocyte: 32.7 %
WBC: 4.5 10*3/uL (ref 3.8–10.8)

## 2020-06-15 LAB — URINALYSIS, ROUTINE W REFLEX MICROSCOPIC
Bacteria, UA: NONE SEEN /HPF
Bilirubin Urine: NEGATIVE
Glucose, UA: NEGATIVE
Hgb urine dipstick: NEGATIVE
Hyaline Cast: NONE SEEN /LPF
Ketones, ur: NEGATIVE
Nitrite: NEGATIVE
Protein, ur: NEGATIVE
Specific Gravity, Urine: 1.02 (ref 1.001–1.03)
Squamous Epithelial / HPF: NONE SEEN /HPF (ref <=5)
pH: 6.5 (ref 5.0–8.0)

## 2020-06-15 LAB — MICROALBUMIN / CREATININE URINE RATIO
Creatinine, Urine: 102 mg/dL (ref 20–275)
Microalb Creat Ratio: 3 mcg/mg creat (ref <30)
Microalb, Ur: 0.3 mg/dL

## 2020-06-15 LAB — VITAMIN B12: Vitamin B-12: >2000 pg/mL — ABNORMAL HIGH (ref 200–1100)

## 2020-06-15 LAB — COMPLETE METABOLIC PANEL WITH GFR
AG Ratio: 1.4 (calc) (ref 1.0–2.5)
ALT: 13 U/L (ref 6–29)
AST: 17 U/L (ref 10–35)
Albumin: 3.9 g/dL (ref 3.6–5.1)
Alkaline phosphatase (APISO): 65 U/L (ref 37–153)
BUN/Creatinine Ratio: 26 (calc) — ABNORMAL HIGH (ref 6–22)
BUN: 26 mg/dL — ABNORMAL HIGH (ref 7–25)
CO2: 28 mmol/L (ref 20–32)
Calcium: 9.6 mg/dL (ref 8.6–10.4)
Chloride: 106 mmol/L (ref 98–110)
Creat: 1.01 mg/dL — ABNORMAL HIGH (ref 0.50–0.99)
GFR, Est African American: 66 mL/min/{1.73_m2} (ref >=60)
GFR, Est Non African American: 57 mL/min/{1.73_m2} — ABNORMAL LOW (ref >=60)
Globulin: 2.8 g/dL (calc) (ref 1.9–3.7)
Glucose, Bld: 91 mg/dL (ref 65–99)
Potassium: 4.4 mmol/L (ref 3.5–5.3)
Sodium: 141 mmol/L (ref 135–146)
Total Bilirubin: 0.4 mg/dL (ref 0.2–1.2)
Total Protein: 6.7 g/dL (ref 6.1–8.1)

## 2020-06-15 LAB — MAGNESIUM: Magnesium: 2.1 mg/dL (ref 1.5–2.5)

## 2020-06-15 LAB — VITAMIN D 25 HYDROXY (VIT D DEFICIENCY, FRACTURES): Vit D, 25-Hydroxy: 84 ng/mL (ref 30–100)

## 2020-06-15 LAB — TSH: TSH: 0.11 mIU/L — ABNORMAL LOW (ref 0.40–4.50)

## 2020-06-15 MED ORDER — LEVOTHYROXINE SODIUM 125 MCG PO TABS
ORAL_TABLET | ORAL | 0 refills | Status: DC
Start: 2020-06-15 — End: 2020-09-13

## 2020-06-17 DIAGNOSIS — Z87440 Personal history of urinary (tract) infections: Secondary | ICD-10-CM | POA: Diagnosis not present

## 2020-06-17 DIAGNOSIS — I129 Hypertensive chronic kidney disease with stage 1 through stage 4 chronic kidney disease, or unspecified chronic kidney disease: Secondary | ICD-10-CM | POA: Diagnosis not present

## 2020-06-17 DIAGNOSIS — E559 Vitamin D deficiency, unspecified: Secondary | ICD-10-CM | POA: Diagnosis not present

## 2020-06-17 DIAGNOSIS — N183 Chronic kidney disease, stage 3 unspecified: Secondary | ICD-10-CM | POA: Diagnosis not present

## 2020-06-20 MED FILL — ATENOLOL 100 MG TABLET: 100 | 90 days supply | Qty: 90 | Fill #3

## 2020-07-16 ENCOUNTER — Other Ambulatory Visit (HOSPITAL_BASED_OUTPATIENT_CLINIC_OR_DEPARTMENT_OTHER): Payer: Self-pay

## 2020-07-29 ENCOUNTER — Other Ambulatory Visit (HOSPITAL_COMMUNITY): Payer: Self-pay

## 2020-08-27 ENCOUNTER — Other Ambulatory Visit (HOSPITAL_COMMUNITY): Payer: Self-pay

## 2020-08-27 ENCOUNTER — Other Ambulatory Visit: Payer: Self-pay | Admitting: Internal Medicine

## 2020-08-27 MED ORDER — FAMOTIDINE 20 MG PO TABS
ORAL_TABLET | ORAL | 3 refills | Status: DC
  Filled 2020-08-27: qty 180, 90d supply, fill #0
  Filled 2020-11-19: qty 180, 90d supply, fill #1
  Filled 2021-02-10: qty 180, 90d supply, fill #2
  Filled 2021-05-28: qty 180, 90d supply, fill #3

## 2020-08-27 MED FILL — Sertraline HCl Tab 100 MG: ORAL | 90 days supply | Qty: 90 | Fill #0 | Status: AC

## 2020-08-28 ENCOUNTER — Other Ambulatory Visit (INDEPENDENT_AMBULATORY_CARE_PROVIDER_SITE_OTHER): Payer: PPO

## 2020-08-28 ENCOUNTER — Encounter: Payer: Self-pay | Admitting: Gastroenterology

## 2020-08-28 ENCOUNTER — Ambulatory Visit: Payer: PPO | Admitting: Gastroenterology

## 2020-08-28 ENCOUNTER — Other Ambulatory Visit: Payer: Self-pay | Admitting: Physician Assistant

## 2020-08-28 ENCOUNTER — Other Ambulatory Visit (HOSPITAL_COMMUNITY): Payer: Self-pay

## 2020-08-28 VITALS — BP 100/66 | HR 75 | Ht 63.0 in | Wt 168.4 lb

## 2020-08-28 DIAGNOSIS — Z8371 Family history of colonic polyps: Secondary | ICD-10-CM

## 2020-08-28 DIAGNOSIS — I10 Essential (primary) hypertension: Secondary | ICD-10-CM

## 2020-08-28 DIAGNOSIS — K219 Gastro-esophageal reflux disease without esophagitis: Secondary | ICD-10-CM

## 2020-08-28 DIAGNOSIS — R1032 Left lower quadrant pain: Secondary | ICD-10-CM

## 2020-08-28 LAB — CREATININE, SERUM: Creatinine, Ser: 1.09 mg/dL (ref 0.40–1.20)

## 2020-08-28 LAB — BUN: BUN: 27 mg/dL — ABNORMAL HIGH (ref 6–23)

## 2020-08-28 MED ORDER — ATENOLOL 100 MG PO TABS
ORAL_TABLET | ORAL | 3 refills | Status: DC
  Filled 2020-08-28: qty 90, 90d supply, fill #0
  Filled 2020-12-06: qty 90, 90d supply, fill #1
  Filled 2021-06-04: qty 90, 90d supply, fill #2

## 2020-08-28 NOTE — Progress Notes (Signed)
History of Present Illness: This is a 70 year old female referred by Unk [name], MD for the evaluation of constipation, diarrhea, incontinence, LLQ pain/bulging, family history of colon polyps.  She relates a long history of alternating diarrhea and constipation.  When she has looser urgent stools she occasionally will have fecal incontinence.  When constipated she notes that bulge in her left lower quadrant associated with pain.  She is concerned this might be a hernia.  At times she also has crampy abdominal pain.  Her reflux symptoms are currently controlled on pantoprazole.  She attempted to control them with famotidine however was not successful and she resumed pantoprazole.  Her last colonoscopy was performed in 2014 showing mild diverticulosis and hyperplastic polyps. Denies weight loss, change in stool caliber, melena, hematochezia, nausea, vomiting, dysphagia, chest pain.     Allergies  Allergen Reactions  . Ace Inhibitors Other (See Comments)    Acute renal failure (lisinopril)  . Cephalexin Anaphylaxis  . Dilaudid [Hydromorphone Hcl] Anaphylaxis  . Robaxin [Methocarbamol] Anaphylaxis  . Macrobid [Nitrofurantoin] Other (See Comments)    Causes hyperthermia   . Neurontin [Gabapentin]     Confusion   . Codeine Nausea Only  . Latex Rash  . Sulfa Antibiotics Other (See Comments)    Do not take per nephrology   Outpatient Medications Prior to Visit  Medication Sig Dispense Refill  . albuterol (VENTOLIN HFA) 108 (90 Base) MCG/ACT inhaler Inhale 1-2 puffs into the lungs every 4 (four) hours as needed for wheezing or shortness of breath. 18 g 1  . ALPRAZolam (XANAX) 0.5 MG tablet TAKE 1/2 TO 1 TABLET BY MOUTH THREE TIMES A DAY IF NEEDED FOR ANXIETY OR SLEEP (Patient taking differently: Take 0.25-0.5 mg by mouth 3 (three) times daily as needed for anxiety or sleep.) 90 tablet 0  . ascorbic acid (VITAMIN C) 250 MG tablet Take 250 mg by mouth daily.    Marland Kitchen aspirin EC 81 MG tablet  Take 81 mg by mouth daily.     Marland Kitchen atenolol (TENORMIN) 100 MG tablet Take 1 tablet daily  for blood pressure (Patient taking differently: Take 1 tablet daily  for blood pressure) 90 tablet 3  . buPROPion (WELLBUTRIN XL) 150 MG 24 hr tablet Take 3 tablets (450 mg) every Morning for Mood & Depression 270 tablet 3  . Cholecalciferol (VITAMIN D-3) 5000 UNITS TABS Take 5,000 Units by mouth daily.     Marland Kitchen CINNAMON PO Take 1 tablet by mouth daily.    Marland Kitchen docusate sodium (COLACE) 100 MG capsule Take 100 mg by mouth 2 (two) times daily as needed for mild constipation.    . famotidine (PEPCID) 20 MG tablet TAKE 1 TABLET BY MOUTH TWICE DAILY FOR INDIGESTION AND HEARTBURN 180 tablet 3  . hyoscyamine (LEVBID) 0.375 MG 12 hr tablet Take 1 tablet 2 x /day for IBS 180 tablet 1  . hyoscyamine (LEVSIN SL) 0.125 MG SL tablet Dissolve 1 tablet under tongue every 4 hours if Needed for Nausea, Bloating, Cramping or Diarrhea 60 tablet 1  . ipratropium-albuterol (DUONEB) 0.5-2.5 (3) MG/3ML SOLN USE 1 VIAL IN NEBULIZER EVERY 4 HOURS AS NEEDED **MAX 6 DOSES A DAY** 540 mL 0  . levothyroxine (SYNTHROID) 125 MCG tablet Take 1 tablet 4 days a week and 1/2 tab 3 days a week (MWF) on an empty stomach with only water for 30 minutes & no Antacid meds, Calcium or Magnesium for 4 hours & avoid Biotin 90 tablet 0  . Magnesium  250 MG TABS Take 250 mg by mouth daily.    . ondansetron (ZOFRAN) 8 MG tablet TAKE 1 TABLET BY MOUTH 3 TIMES A DAY AS NEEDED FOR NAUSEA 60 tablet 2  . pantoprazole (PROTONIX) 40 MG tablet Take 1 tablet (40 mg total) by mouth daily. 90 tablet 1  . Pomegranate, [place] gran[place], (POMEGRANATE PO) Take by mouth daily.    . promethazine (PHENERGAN) 25 MG tablet Take 1 tablet (25 mg total) by mouth every 6 (six) hours as needed for nausea or vomiting. 60 tablet 3  . sertraline (ZOLOFT) 100 MG tablet TAKE 1 TABLET BY MOUTH DAILY FOR MOOD / DEPRESSION 90 tablet 3  . TURMERIC CURCUMIN PO Take by mouth daily.    Marland Kitchen levothyroxine  (SYNTHROID) 125 MCG tablet TAKE 1 TABLET BY MOUTH DAILY ON AN EMPTY STOMACH. HAVE ONLY WATER FOR 30 MINUTES & NO ANTACID MEDS, CALCIUM OR MAGNESIUM FOR 4 HOURS 90 tablet 0   No facility-administered medications prior to visit.   Past Medical History:  Diagnosis Date  . ADHD (attention deficit hyperactivity disorder)   . AKI (acute kidney injury) ([place]) 03/09/2019  . Allergy    seasonal  . Ascending aorta dilatation ([place]) 11/03/2019   Per cardiology and cardiothoracic surgery normal for age, no further follow up recommended.   Ct chest 10/2019 3.8 cm  . Cataract   . CHF (congestive heart failure) ([place])    to be evaluated  . Chronic kidney disease (CKD), stage III (moderate) ([facility])   . Depression   . Diverticulosis   . Emphysema lung ([place])   . Emphysema of lung ([place])    no home oxygen  . Encephalopathy, metabolic 9/37/3428  . GERD (gastroesophageal reflux disease)   . Helicobacter pylori (H. pylori)   . Hyperlipidemia   . Hypertension   . IBS (irritable bowel syndrome)   . LBBB (left bundle branch block)   . Lower urinary tract infection 06/21/2017  . Migraines   . OCD (obsessive compulsive disorder)   . Osteoporosis    osteopenia  . Prediabetes    pcp  . Renal disorder    AKI due to lisinpril, his. pyelonephritis  . Sepsis ([place])   . Sleep apnea    no CPAP  . Stroke ([place])    TIAs  . Thyroid disease   . TIA (transient ischemic attack)   . Vitamin D deficiency    Past Surgical History:  Procedure Laterality Date  . AUGMENTATION MAMMAPLASTY    . BREAST ENHANCEMENT SURGERY    . BREAST IMPLANT REMOVAL Bilateral 03/07/2019  . BUNIONECTOMY     Bil  . COLON SURGERY     colonoscopy  . COLONOSCOPY  2014  . ESOPHAGOGASTRODUODENOSCOPY (EGD) WITH PROPOFOL N/A 03/12/2019   Procedure: ESOPHAGOGASTRODUODENOSCOPY (EGD) WITH PROPOFOL;  Surgeon: Carol Ada, MD;  Location: [facility];  Service: Endoscopy;  Laterality: N/A;  . HEMORRHOID SURGERY    . OOPHORECTOMY    . ORIF ANKLE  FRACTURE Left 08/30/2017   Procedure: OPEN REDUCTION INTERNAL FIXATION (ORIF) ANKLE FRACTURE;  Surgeon: Rod Can, MD;  Location: [facility];  Service: Orthopedics;  Laterality: Left;  . TONSILLECTOMY     Social History   Socioeconomic History  . Marital status: Legally Separated    Spouse name: Not on file  . Number of children: Not on file  . Years of education: Not on file  . Highest education level: Not on file  Occupational History  . Not on file  Tobacco Use  .  Smoking status: Former Smoker    Packs/day: 1.00    Years: 40.00    Pack years: 40.00    Types: Cigarettes    Quit date: 04/27/2002    Years since quitting: 18.3  . Smokeless tobacco: Never Used  Vaping Use  . Vaping Use: Never used  Substance and Sexual Activity  . Alcohol use: No  . Drug use: No  . Sexual activity: Not Currently  Other Topics Concern  . Not on file  Social History Narrative  . Not on file   Social Determinants of Health   Financial Resource Strain: Not on file  Food Insecurity: Not on file  Transportation Needs: Not on file  Physical Activity: Not on file  Stress: Not on file  Social Connections: Not on file   Family History  Problem Relation Age of Onset  . Colon polyps Mother   . Muscular dystrophy Mother        late 9s  . Muscular dystrophy Maternal Grandmother        50  . Heart block Maternal Grandmother   . Colon cancer Neg Hx   . Esophageal cancer Neg Hx   . Rectal cancer Neg Hx   . Stomach cancer Neg Hx      Review of Systems: Pertinent positive and negative review of systems were noted in the above HPI section. All other review of systems were otherwise negative.   Physical Exam: General: Well developed, well nourished, no acute distress Head: Normocephalic and atraumatic Eyes: Sclerae anicteric, EOMI Ears: Normal auditory acuity Mouth: Not examined, mask on during Covid-19 pandemic Neck: Supple, no masses or thyromegaly Lungs: Clear throughout to  auscultation Heart: Regular rate and rhythm; no murmurs, rubs or bruits Abdomen: Soft, non tender and non distended. No masses, hepatosplenomegaly or hernias noted. Normal Bowel sounds Rectal: Deferred to colonoscopy Musculoskeletal: Symmetrical with no gross deformities  Skin: No lesions on visible extremities Pulses:  Normal pulses noted Extremities: No clubbing, cyanosis, edema or deformities noted Neurological: Alert oriented x 4, grossly nonfocal Cervical Nodes:  No significant cervical adenopathy Inguinal Nodes: No significant inguinal adenopathy Psychological:  Alert and cooperative. Normal mood and affect   Assessment and Recommendations:  1.  IBS with alternating diarrhea and constipation, left lower quadrant pain, left lower quadrant bulge, occasional fecal incontinence, family history of colon polyps.  I advised her to begin a regular daily dose of MiraLAX, or the generic equivalent, between 1 and 2 scoops titrated for a complete formed bowel movement daily. Continue hyoscyamine 0.375 mg po bid. Continue hyoscyamine 0.125 mg po q4h prn.  Schedule CT AP. Schedule colonoscopy. The risks (including bleeding, perforation, infection, missed lesions, medication reactions and possible hospitalization or surgery if complications occur), benefits, and alternatives to colonoscopy with possible biopsy and possible polypectomy were discussed with the patient and they consent to proceed.   2. GERD. Continue pantoprazole 40 mg po qd and follow antireflux measures.    cc: Unk [name], MD [address] [place],  [place] [id number]

## 2020-08-28 NOTE — Patient Instructions (Signed)
Your provider has requested that you go to the basement level for lab work before leaving today. Press "B" on the elevator. The lab is located at the first door on the left as you exit the elevator.  Take your Miralax every day to prevent constipation.   You have been scheduled for a CT scan of the abdomen and pelvis at [facility] ([address]---this is in the same building as [employer]).   You are scheduled on 09/03/20 at 1:30pm. You should arrive 15 minutes prior to your appointment time for registration. Please follow the written instructions below on the day of your exam:  WARNING: IF YOU ARE ALLERGIC TO IODINE/X-RAY DYE, PLEASE NOTIFY RADIOLOGY IMMEDIATELY AT [phone]! YOU WILL BE GIVEN A 13 HOUR PREMEDICATION PREP.  1) Do not eat or drink anything after 9:30am (4 hours prior to your test) 2) You have been given 2 bottles of oral contrast to drink. The solution may taste better if refrigerated, but do NOT add ice or any other liquid to this solution. Shake well before drinking.    Drink 1 bottle of contrast @ 11:30am (2 hours prior to your exam)  Drink 1 bottle of contrast @ 12:30pm (1 hour prior to your exam)  You may take any medications as prescribed with a small amount of water, if necessary. If you take any of the following medications: METFORMIN, GLUCOPHAGE, GLUCOVANCE, AVANDAMET, RIOMET, FORTAMET, [place] MET, JANUMET, GLUMETZA or METAGLIP, you MAY be asked to HOLD this medication 48 hours AFTER the exam.  The purpose of you drinking the oral contrast is to aid in the visualization of your intestinal tract. The contrast solution may cause some diarrhea. Depending on your individual set of symptoms, you may also receive an intravenous injection of x-ray contrast/dye. Plan on being at [facility] for 30 minutes or longer, depending on the type of exam you are having performed.  This test typically takes 30-45 minutes to complete.  If you have  any questions regarding your exam or if you need to reschedule, you may call the CT department at [phone] between the hours of 8:00 am and 5:00 pm, Monday-Friday.  ___________________________________________________________  [name] have been scheduled for a colonoscopy. Please follow written instructions given to you at your visit today.  Please pick up your prep supplies at the pharmacy within the next 1-3 days. If you use inhalers (even only as needed), please bring them with you on the day of your procedure.  Due to recent changes in healthcare laws, you may see the results of your imaging and laboratory studies on MyChart before your provider has had a chance to review them.  We understand that in some cases there may be results that are confusing or concerning to you. Not all laboratory results come back in the same time frame and the provider may be waiting for multiple results in order to interpret others.  Please give [place] 48 hours in order for your provider to thoroughly review all the results before contacting the office for clarification of your results.    Normal BMI (Body Mass Index- based on height and weight) is between 23 and 30. Your BMI today is Body mass index is 29.83 kg/m. Marland Kitchen Please consider follow up  regarding your BMI with your Primary Care Provider.  Thank you for choosing me and [facility].  Pricilla Riffle. Dagoberto Ligas., MD., Marval Regal

## 2020-09-02 ENCOUNTER — Other Ambulatory Visit (HOSPITAL_COMMUNITY): Payer: Self-pay

## 2020-09-02 ENCOUNTER — Other Ambulatory Visit (HOSPITAL_BASED_OUTPATIENT_CLINIC_OR_DEPARTMENT_OTHER): Payer: Self-pay

## 2020-09-02 ENCOUNTER — Other Ambulatory Visit: Payer: Self-pay | Admitting: Internal Medicine

## 2020-09-02 MED ORDER — LEVOTHYROXINE SODIUM 125 MCG PO TABS
ORAL_TABLET | ORAL | 0 refills | Status: DC
  Filled 2020-09-02: qty 90, 90d supply, fill #0

## 2020-09-03 ENCOUNTER — Ambulatory Visit (INDEPENDENT_AMBULATORY_CARE_PROVIDER_SITE_OTHER)
Admission: RE | Admit: 2020-09-03 | Discharge: 2020-09-03 | Disposition: A | Discharge status: Home / Self Care | Payer: PPO | Source: Ambulatory Visit | Attending: Gastroenterology | Admitting: Gastroenterology

## 2020-09-03 ENCOUNTER — Other Ambulatory Visit (HOSPITAL_COMMUNITY): Payer: Self-pay

## 2020-09-03 ENCOUNTER — Other Ambulatory Visit: Payer: Self-pay

## 2020-09-03 DIAGNOSIS — R1032 Left lower quadrant pain: Secondary | ICD-10-CM | POA: Diagnosis not present

## 2020-09-03 DIAGNOSIS — R109 Unspecified abdominal pain: Secondary | ICD-10-CM | POA: Diagnosis not present

## 2020-09-03 MED ORDER — IOHEXOL 300 MG/ML  SOLN
100.0000 mL | Freq: Once | INTRAMUSCULAR | Status: AC | PRN
  Administered 2020-09-03: 100 mL via INTRAVENOUS

## 2020-09-06 ENCOUNTER — Ambulatory Visit
Admission: RE | Admit: 2020-09-06 | Discharge: 2020-09-06 | Disposition: A | Discharge status: Home / Self Care | Payer: PPO | Source: Ambulatory Visit | Attending: Adult Health | Admitting: Adult Health

## 2020-09-06 ENCOUNTER — Encounter (INDEPENDENT_AMBULATORY_CARE_PROVIDER_SITE_OTHER): Payer: PPO

## 2020-09-06 DIAGNOSIS — J441 Chronic obstructive pulmonary disease with (acute) exacerbation: Secondary | ICD-10-CM

## 2020-09-06 DIAGNOSIS — R058 Other specified cough: Secondary | ICD-10-CM

## 2020-09-06 DIAGNOSIS — J449 Chronic obstructive pulmonary disease, unspecified: Secondary | ICD-10-CM | POA: Diagnosis not present

## 2020-09-06 MED ORDER — AZITHROMYCIN 250 MG PO TABS: ORAL_TABLET | ORAL | 1 refills | Status: AC

## 2020-09-06 MED ORDER — PROMETHAZINE-DM 6.25-15 MG/5ML PO SYRP: 5.0000 mL | ORAL_SOLUTION | Freq: Four times a day (QID) | ORAL | 1 refills | Status: DC | PRN

## 2020-09-06 MED ORDER — DEXAMETHASONE 1 MG PO TABS: ORAL_TABLET | ORAL | 0 refills | Status: DC

## 2020-09-06 NOTE — Telephone Encounter (Signed)
THIS ENCOUNTER IS A VIRTUAL VISIT DUE TO COVID-19 - PATIENT WAS NOT SEEN IN THE OFFICE.  PATIENT HAS CONSENTED TO VIRTUAL VISIT / TELEMEDICINE VISIT   Virtual Visit via telephone Note  I connected with [name] on 09/06/2020 by telephone.  I verified that I am speaking with the correct person using two identifiers.    I discussed the limitations of evaluation and management by telemedicine and the availability of in person appointments. The patient expressed understanding and agreed to proceed.  History of Present Illness:  70 y.o. with hx of CHF, former smoker with emphysema and chronic bronchitis contacted office reporting progressive cough with productivity.  She reports sx began with mild URI/allergy like sx but settled into chest 1-2 weeks ago, has noted increasing productivity (thick green). She denies dyspnea, but does note increased fatigue. Denies fever/chills. Endorses wheezing, hacking cough. Denies PND, edema. Does weigh regularly for CHF, weight has fluctuated but not noting trending up.   She has been taking breztri inhaler, albuterol, guaifenasin, benzonatate, antihistamine, also reports has been taking dexamethasone from previous taper x 3 days without benefit. Concern due to planned colonoscopy next week and hx of admission secondary to similar flare.   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  dexamethasone (DECADRON) 1 MG tablet, Take 3 tabs for 3 days, 2 tabs for 3 days 1 tab for 5 days. Take with food. Marland Kitchen  levothyroxine (SYNTHROID) 125 MCG tablet, Take 1 tablet 4 days a week and 1/2 tab 3 days a week (MWF) on an empty stomach with only water for 30 minutes & no Antacid meds, Calcium or Magnesium for 4 hours & avoid Biotin .  levothyroxine (SYNTHROID) 125 MCG tablet, Take  1 tablet  by mouth daily on an empty stomach  with only water for 30 minutes & no antacid meds, calcium or magnesium for 4 hours & avoid biotin  Current Outpatient Medications (Cardiovascular):  .  atenolol (TENORMIN) 100 MG tablet, Take 1 tablet daily  for blood pressure (Patient taking differently: Take 1 tablet daily  for blood pressure)  Current Outpatient Medications (Respiratory):  .  promethazine-dextromethorphan (PROMETHAZINE-DM) 6.25-15 MG/5ML syrup, Take 5 mLs by mouth 4 (four) times daily as needed for cough. Marland Kitchen  albuterol (VENTOLIN HFA) 108 (90 Base) MCG/ACT inhaler, Inhale 1-2 puffs into the lungs every 4 (four) hours as needed for wheezing or shortness of breath. Marland Kitchen  ipratropium-albuterol (DUONEB) 0.5-2.5 (3) MG/3ML SOLN, USE 1 VIAL IN NEBULIZER EVERY 4 HOURS AS NEEDED **MAX 6 DOSES A DAY** .  promethazine (PHENERGAN) 25 MG tablet, Take 1 tablet (25 mg total) by mouth every 6 (six) hours as needed for nausea or vomiting.  Current Outpatient Medications (Analgesics):  .  aspirin EC 81 MG tablet, Take 81 mg by mouth daily.    Current Outpatient Medications (Other):  .  azithromycin (ZITHROMAX) 250 MG tablet, Take 2 tablets (500 mg) on  Day 1,  followed by 1 tablet (250 mg) once daily on Days 2 through 5. .  ALPRAZolam (XANAX) 0.5 MG tablet, TAKE 1/2 TO 1 TABLET BY MOUTH THREE TIMES A DAY IF NEEDED FOR ANXIETY OR SLEEP (Patient taking differently: Take 0.25-0.5 mg by mouth 3 (three) times daily as needed for anxiety or sleep.) .  ascorbic acid (VITAMIN C) 250 MG tablet, Take 250  mg by mouth daily. Marland Kitchen  buPROPion (WELLBUTRIN XL) 150 MG 24 hr tablet, Take 3 tablets (450 mg) every Morning for Mood & Depression .  Cholecalciferol (VITAMIN D-3) 5000 UNITS TABS, Take 5,000 Units by mouth daily.  Marland Kitchen  CINNAMON PO, Take 1 tablet by mouth daily. Marland Kitchen  docusate sodium (COLACE) 100 MG capsule, Take 100 mg by mouth 2 (two) times daily as needed for mild constipation. .  famotidine (PEPCID) 20 MG tablet, TAKE 1 TABLET BY MOUTH TWICE DAILY FOR  INDIGESTION AND HEARTBURN .  hyoscyamine (LEVBID) 0.375 MG 12 hr tablet, Take 1 tablet 2 x /day for IBS .  hyoscyamine (LEVSIN SL) 0.125 MG SL tablet, Dissolve 1 tablet under tongue every 4 hours if Needed for Nausea, Bloating, Cramping or Diarrhea .  Magnesium 250 MG TABS, Take 250 mg by mouth daily. .  ondansetron (ZOFRAN) 8 MG tablet, TAKE 1 TABLET BY MOUTH 3 TIMES A DAY AS NEEDED FOR NAUSEA .  pantoprazole (PROTONIX) 40 MG tablet, Take 1 tablet (40 mg total) by mouth daily. .  Pomegranate, [place] gran[place], (POMEGRANATE PO), Take by mouth daily. .  sertraline (ZOLOFT) 100 MG tablet, TAKE 1 TABLET BY MOUTH DAILY FOR MOOD / DEPRESSION .  TURMERIC CURCUMIN PO, Take by mouth daily.  Problem list She has Hypothyroidism; Recurrent major depression ([place]); OCD (obsessive compulsive disorder); Abnormal glucose; Migraines; Vitamin D deficiency; LBBB (left bundle branch block); HTN (hypertension); Hyperlipidemia, mixed; Medication management; CKD Stage 3 ([place]); IBS ; History of colon polyps; Osteopenia; Chronic diastolic CHF (congestive heart failure) ([place]); Atherosclerosis of aorta ([place]); Emphysema lung ([place]); Former smoker; Chronic bronchitis ([place]); Overweight (BMI 25.0-29.9); and Interstitial cystitis on their problem list.   Observations/Objective:  General : Congested sounding patient in no apparent distress HEENT: Mildly hoarse vocal quality, 2 brief hacking coughing episodes during visit Lungs: speaks in complete sentences, no audible wheezing, no apparent distress Neurological: alert, oriented x 3 Psychiatric: pleasant, judgement appropriate    Assessment and Plan:  Diagnoses and all orders for this visit:  Productive cough Chronic obstructive pulmonary disease with acute exacerbation ([place]) Suspect COPD flare with several weeks of progressive productivity and cough;  Per description of weight at home lower suspicion of fluid overload -  monitor weight closely, if trending up can restart  previous lasix x 2-3 days - ED if dyspnea CXR r/o pneumonia and would show if evidence of fluid overload Will proceed with zpak and steroid, continue inahlers, monitor O2 and weight pending CXR results Strict ED precautions; she expresses understanding of plan and will present to imaging center immediately  -     promethazine-dextromethorphan (PROMETHAZINE-DM) 6.25-15 MG/5ML syrup; Take 5 mLs by mouth 4 (four) times daily as needed for cough. -     dexamethasone (DECADRON) 1 MG tablet; Take 3 tabs for 3 days, 2 tabs for 3 days 1 tab for 5 days. Take with food. -     azithromycin (ZITHROMAX) 250 MG tablet; Take 2 tablets (500 mg) on  Day 1,  followed by 1 tablet (250 mg) once daily on Days 2 through 5. -     DG Chest 2 View; Future   Follow Up Instructions:  I discussed the assessment and treatment plan with the patient. The patient was provided an opportunity to ask questions and all were answered. The patient agreed with the plan and demonstrated an understanding of the instructions.   The patient was advised to call back or seek an in-person evaluation if the symptoms worsen or  if the condition fails to improve as anticipated.  I provided 20 minutes of non-face-to-face time during this encounter.   Izora Ribas, NP

## 2020-09-09 ENCOUNTER — Telehealth: Payer: Self-pay | Admitting: Gastroenterology

## 2020-09-09 ENCOUNTER — Other Ambulatory Visit: Payer: Self-pay | Admitting: Adult Health

## 2020-09-09 DIAGNOSIS — R911 Solitary pulmonary nodule: Secondary | ICD-10-CM

## 2020-09-09 HISTORY — DX: Solitary pulmonary nodule: R91.1

## 2020-09-09 NOTE — Telephone Encounter (Signed)
Hey Dr. Fuller Plan this pt called to cxl her procedure that was sch'd for 09/12/20. She stated that she isn't feeling well and prefers to postpone her procedure. She has rsch'd her appt for 11/14/20 at 11:00 am. Thank you

## 2020-09-12 ENCOUNTER — Encounter: Payer: PPO | Admitting: Gastroenterology

## 2020-09-13 ENCOUNTER — Other Ambulatory Visit (HOSPITAL_COMMUNITY): Payer: Self-pay

## 2020-09-13 ENCOUNTER — Other Ambulatory Visit: Payer: Self-pay | Admitting: Adult Health

## 2020-09-13 MED ORDER — DEXAMETHASONE 1 MG PO TABS
ORAL_TABLET | ORAL | 0 refills | Status: DC
  Filled 2020-09-13: qty 20, 11d supply, fill #0

## 2020-09-13 MED ORDER — AZITHROMYCIN 250 MG PO TABS
ORAL_TABLET | ORAL | 1 refills | Status: AC
  Filled 2020-09-13: qty 6, 5d supply, fill #0

## 2020-09-16 ENCOUNTER — Other Ambulatory Visit: Payer: PPO

## 2020-09-16 ENCOUNTER — Other Ambulatory Visit: Payer: Self-pay | Admitting: Internal Medicine

## 2020-09-16 DIAGNOSIS — Z1231 Encounter for screening mammogram for malignant neoplasm of breast: Secondary | ICD-10-CM

## 2020-09-17 ENCOUNTER — Ambulatory Visit
Admission: RE | Admit: 2020-09-17 | Discharge: 2020-09-17 | Disposition: A | Discharge status: Home / Self Care | Payer: PPO | Source: Ambulatory Visit | Attending: Adult Health | Admitting: Adult Health

## 2020-09-17 ENCOUNTER — Other Ambulatory Visit: Payer: Self-pay

## 2020-09-17 DIAGNOSIS — K449 Diaphragmatic hernia without obstruction or gangrene: Secondary | ICD-10-CM | POA: Diagnosis not present

## 2020-09-17 DIAGNOSIS — J432 Centrilobular emphysema: Secondary | ICD-10-CM | POA: Diagnosis not present

## 2020-09-17 DIAGNOSIS — M47812 Spondylosis without myelopathy or radiculopathy, cervical region: Secondary | ICD-10-CM | POA: Diagnosis not present

## 2020-09-17 DIAGNOSIS — R911 Solitary pulmonary nodule: Secondary | ICD-10-CM

## 2020-09-17 DIAGNOSIS — I2510 Atherosclerotic heart disease of native coronary artery without angina pectoris: Secondary | ICD-10-CM | POA: Diagnosis not present

## 2020-10-02 ENCOUNTER — Other Ambulatory Visit: Payer: Self-pay | Admitting: Internal Medicine

## 2020-10-02 ENCOUNTER — Other Ambulatory Visit (HOSPITAL_COMMUNITY): Payer: Self-pay

## 2020-10-02 DIAGNOSIS — K219 Gastro-esophageal reflux disease without esophagitis: Secondary | ICD-10-CM

## 2020-10-02 DIAGNOSIS — F32A Depression, unspecified: Secondary | ICD-10-CM

## 2020-10-02 DIAGNOSIS — F988 Other specified behavioral and emotional disorders with onset usually occurring in childhood and adolescence: Secondary | ICD-10-CM

## 2020-10-02 MED ORDER — BUPROPION HCL ER (XL) 150 MG PO TB24
ORAL_TABLET | ORAL | 1 refills | Status: DC
  Filled 2020-10-02: qty 270, 90d supply, fill #0

## 2020-10-02 MED ORDER — ONDANSETRON HCL 8 MG PO TABS
ORAL_TABLET | ORAL | 2 refills | Status: AC
  Filled 2020-10-02: qty 60, 20d supply, fill #0

## 2020-10-03 ENCOUNTER — Other Ambulatory Visit (HOSPITAL_COMMUNITY): Payer: Self-pay

## 2020-10-25 HISTORY — PX: COLONOSCOPY: SHX174

## 2020-10-29 ENCOUNTER — Telehealth: Payer: Self-pay | Admitting: Internal Medicine

## 2020-10-29 NOTE — Chronic Care Management (AMB) (Signed)
  Chronic Care Management   Note  10/29/2020 Name: [name] MRN: [id number] DOB: 1950-12-14  [name] is a 70 y.o. year old female who is a primary care patient of Unk [name], MD. I reached out to Marguarite Arbour by phone today in response to a referral sent by [name] PCP, Unk [name], MD.   [name] was given information about Chronic Care Management services today including:  CCM service includes personalized support from designated clinical staff supervised by her physician, including individualized plan of care and coordination with other care providers 24/7 contact phone numbers for assistance for urgent and routine care needs. Service will only be billed when office clinical staff spend 20 minutes or more in a month to coordinate care. Only one practitioner may furnish and bill the service in a calendar month. The patient may stop CCM services at any time (effective at the end of the month) by phone call to the office staff.   Patient agreed to services and verbal consent obtained.   Follow up plan:   Tatjana Secretary/administrator

## 2020-11-06 ENCOUNTER — Other Ambulatory Visit: Payer: Self-pay | Admitting: Adult Health

## 2020-11-06 DIAGNOSIS — J441 Chronic obstructive pulmonary disease with (acute) exacerbation: Secondary | ICD-10-CM

## 2020-11-12 ENCOUNTER — Other Ambulatory Visit: Payer: Self-pay

## 2020-11-12 ENCOUNTER — Other Ambulatory Visit: Payer: Self-pay | Admitting: Internal Medicine

## 2020-11-12 ENCOUNTER — Ambulatory Visit
Admission: RE | Admit: 2020-11-12 | Discharge: 2020-11-12 | Disposition: A | Discharge status: Home / Self Care | Payer: PPO | Source: Ambulatory Visit

## 2020-11-12 DIAGNOSIS — Z1231 Encounter for screening mammogram for malignant neoplasm of breast: Secondary | ICD-10-CM

## 2020-11-14 ENCOUNTER — Other Ambulatory Visit: Payer: Self-pay

## 2020-11-14 ENCOUNTER — Encounter: Payer: Self-pay | Admitting: Gastroenterology

## 2020-11-14 ENCOUNTER — Ambulatory Visit (AMBULATORY_SURGERY_CENTER): Payer: PPO | Admitting: Gastroenterology

## 2020-11-14 VITALS — BP 97/56 | HR 59 | Temp 96.6°F | Resp 14 | Ht 63.0 in | Wt 168.0 lb

## 2020-11-14 DIAGNOSIS — Z86010 Personal history of colonic polyps: Secondary | ICD-10-CM | POA: Diagnosis not present

## 2020-11-14 DIAGNOSIS — Z8371 Family history of colonic polyps: Secondary | ICD-10-CM

## 2020-11-14 DIAGNOSIS — K635 Polyp of colon: Secondary | ICD-10-CM | POA: Diagnosis not present

## 2020-11-14 DIAGNOSIS — K6389 Other specified diseases of intestine: Secondary | ICD-10-CM | POA: Diagnosis not present

## 2020-11-14 DIAGNOSIS — D124 Benign neoplasm of descending colon: Secondary | ICD-10-CM | POA: Diagnosis not present

## 2020-11-14 DIAGNOSIS — D125 Benign neoplasm of sigmoid colon: Secondary | ICD-10-CM | POA: Diagnosis not present

## 2020-11-14 DIAGNOSIS — D123 Benign neoplasm of transverse colon: Secondary | ICD-10-CM | POA: Diagnosis not present

## 2020-11-14 MED ORDER — SODIUM CHLORIDE 0.9 % IV SOLN: 500.0000 mL | Freq: Once | INTRAVENOUS | Status: DC

## 2020-11-14 NOTE — Progress Notes (Signed)
Medical history reviewed with no changes noted. VS assessed by C.W 

## 2020-11-14 NOTE — Patient Instructions (Signed)
High fiber diet Continue present medications Awaiting pathology results  YOU HAD AN ENDOSCOPIC PROCEDURE TODAY AT [facility]:   Refer to the procedure report that was given to you for any specific questions about what was found during the examination.  If the procedure report does not answer your questions, please call your gastroenterologist to clarify.  If you requested that your care partner not be given the details of your procedure findings, then the procedure report has been included in a sealed envelope for you to review at your convenience later.  YOU SHOULD EXPECT: Some feelings of bloating in the abdomen. Passage of more gas than usual.  Walking can help get rid of the air that was put into your GI tract during the procedure and reduce the bloating. If you had a lower endoscopy (such as a colonoscopy or flexible sigmoidoscopy) you may notice spotting of blood in your stool or on the toilet paper. If you underwent a bowel prep for your procedure, you may not have a normal bowel movement for a few days.  Please Note:  You might notice some irritation and congestion in your nose or some drainage.  This is from the oxygen used during your procedure.  There is no need for concern and it should clear up in a day or so.  SYMPTOMS TO REPORT IMMEDIATELY:  Following lower endoscopy (colonoscopy or flexible sigmoidoscopy):  Excessive amounts of blood in the stool  Significant tenderness or worsening of abdominal pains  Swelling of the abdomen that is new, acute  Fever of 100F or higher   For urgent or emergent issues, a gastroenterologist can be reached at any hour by calling [phone]. Do not use MyChart messaging for urgent concerns.    DIET:  We do recommend a small meal at first, but then you may proceed to your regular diet.  Drink plenty of fluids but you should avoid alcoholic beverages for 24 hours.  ACTIVITY:  You should plan to take it easy for the rest of  today and you should NOT DRIVE or use heavy machinery until tomorrow (because of the sedation medicines used during the test).    FOLLOW UP: Our staff will call the number listed on your records 48-72 hours following your procedure to check on you and address any questions or concerns that you may have regarding the information given to you following your procedure. If we do not reach you, we will leave a message.  We will attempt to reach you two times.  During this call, we will ask if you have developed any symptoms of COVID 19. If you develop any symptoms (ie: fever, flu-like symptoms, shortness of breath, cough etc.) before then, please call [phone].  If you test positive for Covid 19 in the 2 weeks post procedure, please call and report this information to [place].    If any biopsies were taken you will be contacted by phone or by letter within the next 1-3 weeks.  Please call us at [phone] if you have not heard about the biopsies in 3 weeks.    SIGNATURES/CONFIDENTIALITY: You and/or your care partner have signed paperwork which will be entered into your electronic medical record.  These signatures attest to the fact that that the information above on your After Visit Summary has been reviewed and is understood.  Full responsibility of the confidentiality of this discharge information lies with you and/or your care-partner.

## 2020-11-14 NOTE — Progress Notes (Signed)
pt tolerated well. VSS. awake and to recovery. Report given to RN.  

## 2020-11-14 NOTE — Progress Notes (Signed)
Called to room to assist during endoscopic procedure.  Patient ID and intended procedure confirmed with present staff. Received instructions for my participation in the procedure from the performing physician.  

## 2020-11-14 NOTE — Op Note (Signed)
[facility] [facility] [place] Patient Name: [name] Procedure Date: 11/14/2020 11:31 AM MRN: [id number] Endoscopist: Ladene Artist , MD Age: 70 Referring MD:  Date of Birth: 1950/12/05 Gender: Female Account #: [id number] Procedure:                Colonoscopy Indications:              Colon cancer screening in patient at increased                            risk: Family history of 1st-degree relative with                            colon polyps Medicines:                Monitored Anesthesia Care Procedure:                Pre-Anesthesia Assessment:                           - Prior to the procedure, a History and Physical                            was performed, and patient medications and                            allergies were reviewed. The patient's tolerance of                            previous anesthesia was also reviewed. The risks                            and benefits of the procedure and the sedation                            options and risks were discussed with the patient.                            All questions were answered, and informed consent                            was obtained. Prior Anticoagulants: The patient has                            taken no previous anticoagulant or antiplatelet                            agents. ASA Grade Assessment: III - A patient with                            severe systemic disease. After reviewing the risks                            and benefits, the patient was deemed in  satisfactory condition to undergo the procedure.                           After obtaining informed consent, the colonoscope                            was passed under direct vision. Throughout the                            procedure, the patient's blood pressure, pulse, and                            oxygen saturations were monitored continuously. The                            CF [id number] #[id number] was introduced through the  anus                            and advanced to the the cecum, identified by                            appendiceal orifice and ileocecal valve. The                            ileocecal valve, appendiceal orifice, and rectum                            were photographed. The quality of the bowel                            preparation was good. The colonoscopy was performed                            without difficulty. The patient tolerated the                            procedure well. Scope In: 11:36:09 AM Scope Out: 11:56:20 AM Scope Withdrawal Time: 0 hours 14 minutes 53 seconds  Total Procedure Duration: 0 hours 20 minutes 11 seconds  Findings:                 The perianal and digital rectal examinations were                            normal.                           Three sessile polyps were found in the sigmoid                            colon, descending colon and transverse colon. The                            polyps were 6 to 7 mm in size. These polyps were  removed with a cold snare. Resection and retrieval                            were complete.                           Internal hemorrhoids were found during                            retroflexion. The hemorrhoids were small and Grade                            I (internal hemorrhoids that do not prolapse).                           The exam was otherwise without abnormality on                            direct and retroflexion views.                           Multiple small-mouthed diverticula were found in                            the left colon. There was narrowing of the colon in                            association with the diverticular opening. There                            was evidence of diverticular spasm. There was no                            evidence of diverticular bleeding. Complications:            No immediate complications. Estimated blood loss:                             None. Estimated Blood Loss:     Estimated blood loss: none. Impression:               - Three 6 to 7 mm polyps in the sigmoid colon, in                            the descending colon and in the transverse colon,                            removed with a cold snare. Resected and retrieved.                           - Internal hemorrhoids.                           - The examination was otherwise normal on direct  and retroflexion views.                           - Moderate diverticulosis in the left colon. Recommendation:           - Repeat colonoscopy, 3-5 years, after studies are                            complete for surveillance based on pathology                            results.                           - Patient has a contact number available for                            emergencies. The signs and symptoms of potential                            delayed complications were discussed with the                            patient. Return to normal activities tomorrow.                            Written discharge instructions were provided to the                            patient.                           - High fiber diet.                           - Continue present medications.                           - Await pathology results. Ladene Artist, MD 11/14/2020 12:00:03 PM This report has been signed electronically.

## 2020-11-18 ENCOUNTER — Telehealth: Payer: Self-pay | Admitting: *Deleted

## 2020-11-18 NOTE — Telephone Encounter (Signed)
  Follow up Call-  Call back number 11/14/2020  Post procedure Call Back phone  # [phone]  Permission to leave phone message Yes  Some recent data might be hidden     Patient questions:  Do you have a fever, pain , or abdominal swelling? No. Pain Score  0 *  Have you tolerated food without any problems? Yes.    Have you been able to return to your normal activities? Yes.    Do you have any questions about your discharge instructions: Diet   No. Medications  No. Follow up visit  No.  Do you have questions or concerns about your Care? No.  Actions: * If pain score is 4 or above: No action needed, pain <4.  Have you developed a fever since your procedure? no  2.   Have you had an respiratory symptoms (SOB or cough) since your procedure? no  3.   Have you tested positive for COVID 19 since your procedure no  4.   Have you had any family members/close contacts diagnosed with the COVID 19 since your procedure?  no   If yes to any of these questions please route to Joylene John, RN and Joella Prince, RN

## 2020-11-19 ENCOUNTER — Other Ambulatory Visit: Payer: Self-pay | Admitting: Internal Medicine

## 2020-11-19 MED ORDER — SERTRALINE HCL 100 MG PO TABS
ORAL_TABLET | ORAL | 3 refills | Status: DC
  Filled 2020-11-19: qty 90, 90d supply, fill #0
  Filled 2021-02-10: qty 90, 90d supply, fill #1
  Filled 2021-05-28: qty 90, 90d supply, fill #2
  Filled 2021-08-22: qty 90, 90d supply, fill #3

## 2020-11-20 ENCOUNTER — Other Ambulatory Visit: Payer: Self-pay | Admitting: Adult Health

## 2020-11-20 ENCOUNTER — Other Ambulatory Visit (HOSPITAL_COMMUNITY): Payer: Self-pay

## 2020-11-20 DIAGNOSIS — K219 Gastro-esophageal reflux disease without esophagitis: Secondary | ICD-10-CM

## 2020-11-20 MED ORDER — PANTOPRAZOLE SODIUM 40 MG PO TBEC: 40.0000 mg | DELAYED_RELEASE_TABLET | Freq: Every day | ORAL | 1 refills | Status: DC

## 2020-11-26 ENCOUNTER — Encounter: Payer: Self-pay | Admitting: Gastroenterology

## 2020-11-26 DIAGNOSIS — K4090 Unilateral inguinal hernia, without obstruction or gangrene, not specified as recurrent: Secondary | ICD-10-CM | POA: Diagnosis not present

## 2020-11-26 DIAGNOSIS — R32 Unspecified urinary incontinence: Secondary | ICD-10-CM | POA: Diagnosis not present

## 2020-11-26 DIAGNOSIS — R159 Full incontinence of feces: Secondary | ICD-10-CM | POA: Diagnosis not present

## 2020-12-06 ENCOUNTER — Other Ambulatory Visit (HOSPITAL_COMMUNITY): Payer: Self-pay

## 2020-12-06 ENCOUNTER — Other Ambulatory Visit: Payer: Self-pay | Admitting: Internal Medicine

## 2020-12-06 MED ORDER — LEVOTHYROXINE SODIUM 125 MCG PO TABS
ORAL_TABLET | ORAL | 0 refills | Status: DC
  Filled 2020-12-06: qty 90, 90d supply, fill #0

## 2020-12-20 ENCOUNTER — Encounter: Payer: Self-pay | Admitting: Adult Health

## 2020-12-20 NOTE — Progress Notes (Addendum)
AWV and follow up  Assessment:   Diagnoses and all orders for this visit:  Annual Medicare Wellness Visit Due annually  Health maintenance reviewed  Atherosclerosis of aorta ([place]) Per Ct 2020 Control blood pressure, cholesterol, glucose, increase exercise.   Centrilobular emphysema ([place]) Per CT 2020, denies sx, monitor   Essential hypertension Well controlled; can try 1/2 tab atenolol if any fatigue Monitor blood pressure at home; call if consistently over 130/80 Continue DASH diet.   Reminder to go to the ER if any CP, SOB, nausea, dizziness, severe HA, changes vision/speech, left arm numbness and tingling and jaw pain.  LBBB (left bundle branch block) Continue to monitor- cardiology Dr. Acie Fredrickson follows   Migraine without status migrainosus, not intractable, unspecified migraine type Reports improved recently;  No significant issues or concerns   Irritable bowel syndrome, unspecified type Symptoms stable/improved - continue monitoring diet/lifestyle modification  Hypothyroidism, unspecified type continue medications the same pending lab results reminded to take on an empty stomach 30-28mns before food.  -     TSH  CKD Stage 3 (HCC) Increase fluids, avoid NSAIDS, monitor sugars, will monitor Continue follow up with nephrology -     CMP/GFR  Major depression, recurrent, full remission ([place] Continue medications; limit benzos; doing well with current regimen Lifestyle discussed: diet/exerise, sleep hygiene, stress management, hydration  Obsessive-compulsive disorder, unspecified type Symptoms improved on SSRI  Diverticulosis of intestine without bleeding, unspecified intestinal tract location Increase fiber intake;   Medication management -     CBC with Differential/Platelet -     CMP/GFR  Mixed hyperlipidemia Fair control by lifestyle modification only Continue low cholesterol diet and exercise.  -     Lipid panel  Other abnormal glucose Recent A1Cs at  goal Discussed disease and risks Discussed diet/exercise, weight management  -     CMP/GFR  Vitamin D deficiency At goal at recent check; continue to recommend supplementation for goal of 70-100 Defer vitamin D level  Osteopenia - get dexa q2y, plan next with mmg 2023, continue Vit D and Ca, weight bearing exercises  CHF, diastolic (HCC) Per ECHO while admitted 02/2019 No hx prior, denies notable sx since that time Dr. NJohnsie Cancelrecommended no dedicated follow up unless new sx Limit sodium; monitor weight and control BP  Hx of colon polyps Next colonoscopy due 10/2025 High fiber diet, low red meat/processed meat  Former smoker, 40 pack year, quit 2004 UTD on screening, CT chest 08/2020 was benign  Urinary frequency, dysuria - check UA reflux culture  Screening syphilis Hx of, patient requests recheck today following completion of treatment  Denies concerning sx  Inguinal hernia/diarrhea Try stopping supplements -  Has OV with gen surgery to discuss next week    Orders Placed This Encounter  Procedures   DG Bone Density   CBC with Differential/Platelet   COMPLETE METABOLIC PANEL WITH GFR   Magnesium   Lipid panel   TSH   Urinalysis w microscopic + reflex cultur   RPR     Over 40 minutes of exam, counseling, chart review and critical decision making was performed Future Appointments  Date Time Provider DBay [place] 12/31/2020  9:00 AM ERush Landmark RPH [facility] None  06/13/2021 10:00 AM CLiane Comber NP [facility] None  12/23/2021 10:30 AM CLiane Comber NP [facility] None     Plan:   During the course of the visit the patient was educated and counseled about appropriate screening and preventive services including:   Pneumococcal vaccine  Prevnar 13 Influenza  vaccine Td vaccine Screening electrocardiogram Bone densitometry screening Colorectal cancer screening Diabetes screening Glaucoma screening Nutrition counseling  Advanced directives:  requested   Subjective:  [name] is a 70 y.o. female who presents for AWV and follow up. She has Hypothyroidism; Recurrent major depression ([place]); OCD (obsessive compulsive disorder); Abnormal glucose; Migraines; Vitamin D deficiency; LBBB (left bundle branch block); HTN (hypertension); Hyperlipidemia, mixed; Medication management; CKD Stage 3 ([place]); IBS ; History of colon polyps; Osteopenia; Chronic diastolic CHF (congestive heart failure) ([place]); Atherosclerosis of aorta ([place]); Emphysema lung ([place]); Former smoker (40 pack year, quit 2004); Overweight (BMI 25.0-29.9); Interstitial cystitis; and Incontinence of feces on their problem list.  She has IC, no longer taking meds for this, doing food diary and has found avoiding chocolate to be most beneficial. Has seen uro but not planning on following up.   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   she has a diagnosis of depression/anxiety/OCD and is currently on wellbutrin, sertraline and xanax 0.5 mg TID PRN, reports symptoms are well controlled on current regimen. she currently reports using xanax very rarely.  She reports in the last 6 months has developed persistent loose stools/diarrhea with fecal incontinence, also has left inguinal hernia, has been referred to Dr. Johney Maine by GI and has appointment to discuss next week.   BMI is Body mass index is 29.41 kg/m., she has been working on diet, admits minimal intentional exercise hough very active around home and her yard for several hours weather permitting.  Wt Readings from Last 3 Encounters:  12/23/20 166 lb (75.3 kg)  11/14/20 168 lb (76.2 kg)  08/28/20 168 lb 6.4 oz (76.4 kg)   She has aortic atherosclerosis and centrilobular emphysema per CT chest 02/2019, former smoker, quit in 2004, 40 pack year history. Inhalers only need when ill. 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   In November 2020,  patient had removal if breast implants and had post-op complications post intubation neck & facial swelling with fever & drop in O2 to 79% and all attributed to secondary to cricopharyngeal inflammation.  At this time she underwent Echo 03/10/2019 LVEF estimated 55-60%, Abnormal (paradoxical) septal motion, consistent with left bundle branch block (long history of this on EKG), Grade I diastolic dysfunctoin, she also had some elevated troponins felt to be demand related. Had follow up with Dr. Johnsie Cancel, was not recommended dedicated follow up. LBBB followed by Dr. Johnsie Cancel.    Her blood pressure has been controlled at home, today their BP is BP: (!) 102/56 She does not workout but is doing lots of intense housework. She denies chest pain, shortness of breath, dizziness.   She is not on cholesterol medication, mild elevations treated by lifestyle modification. Declines medication. Her cholesterol is not at goal. The cholesterol last visit was:   Lab Results  Component Value Date   CHOL 189 06/14/2020   HDL 63 06/14/2020   LDLCALC 110 (H) 06/14/2020   TRIG 73 06/14/2020   CHOLHDL 3.0 06/14/2020    She has been working on diet and exercise for glucose management, and denies increased appetite, nausea, paresthesia of the feet, polydipsia, polyuria, visual disturbances and vomiting. Last A1C in the office was:  Lab Results  Component Value Date   HGBA1C 5.4 06/14/2020  She is on thyroid medication. Her medication was not changed last visit, taking 125 mcg daily, stopped biotin since last visit.  Lab Results  Component Value Date   TSH 0.11 (L) 06/14/2020   She is followed by nephrology Dr. Katrine Coho for CKD III Lab Results  Component Value Date   GFRNONAA 57 (L) 06/14/2020   Patient is on Vitamin D supplement and at goal at recent check:  Lab Results  Component Value  Date   VD25OH 84 06/14/2020        Medication Review: Current Outpatient Medications on File Prior to Visit  Medication Sig Dispense Refill   albuterol (VENTOLIN HFA) 108 (90 Base) MCG/ACT inhaler Inhale 1-2 puffs into the lungs every 4 (four) hours as needed for wheezing or shortness of breath. 18 g 1   ALPRAZolam (XANAX) 0.5 MG tablet TAKE 1/2 TO 1 TABLET BY MOUTH THREE TIMES A DAY IF NEEDED FOR ANXIETY OR SLEEP 90 tablet 0   ascorbic acid (VITAMIN C) 250 MG tablet Take 250 mg by mouth daily.     aspirin EC 81 MG tablet Take 81 mg by mouth daily.      atenolol (TENORMIN) 100 MG tablet Take 1 tablet daily  for blood pressure (Patient taking differently: Take 1 tablet daily  for blood pressure) 90 tablet 3   buPROPion (WELLBUTRIN XL) 150 MG 24 hr tablet TAKE 3 TABLETS BY MOUTH EVERY MORNING FOR MOOD AND DEPRESSION 270 tablet 1   Cholecalciferol (VITAMIN D-3) 5000 UNITS TABS Take 5,000 Units by mouth daily.      famotidine (PEPCID) 20 MG tablet TAKE 1 TABLET BY MOUTH TWICE DAILY FOR INDIGESTION AND HEARTBURN 180 tablet 3   hyoscyamine (LEVSIN SL) 0.125 MG SL tablet Dissolve 1 tablet under tongue every 4 hours if Needed for Nausea, Bloating, Cramping or Diarrhea 60 tablet 1   levothyroxine (SYNTHROID) 125 MCG tablet Take  1 tablet  by mouth daily on an empty stomach with only water for 30 minutes & no antacid meds, calcium or magnesium for 4 hours & avoid biotin 90 tablet 0   Magnesium 250 MG TABS Take 250 mg by mouth daily.     ondansetron (ZOFRAN) 8 MG tablet TAKE 1 TABLET BY MOUTH 3 TIMES A DAY AS NEEDED FOR NAUSEA 60 tablet 2   pantoprazole (PROTONIX) 40 MG tablet Take 1 tablet (40 mg total) by mouth daily. 90 tablet 1   promethazine (PHENERGAN) 25 MG tablet Take 1 tablet (25 mg total) by mouth every 6 (six) hours as needed for nausea or vomiting. 60 tablet 3   promethazine-dextromethorphan (PROMETHAZINE-DM) 6.25-15 MG/5ML syrup Take 5 mLs by mouth 4 (four) times daily as needed for cough.  240 mL 1   sertraline (ZOLOFT) 100 MG tablet Take 1 tablet by mouth daily for mood 90 tablet 3   ipratropium-albuterol (DUONEB) 0.5-2.5 (3) MG/3ML SOLN USE 1 VIAL IN NEBULIZER EVERY 4 HOURS AS NEEDED **MAX 6 DOSES A DAY** (Patient not taking: No sig reported) 540 mL 0   No current facility-administered medications on file prior to visit.    Allergies  Allergen Reactions   Ace Inhibitors Other (See Comments)    Acute renal failure (lisinopril)   Cephalexin Anaphylaxis   Dilaudid [Hydromorphone Hcl] Anaphylaxis   Robaxin [Methocarbamol] Anaphylaxis   Macrobid [Nitrofurantoin] Other (See Comments)    Causes hyperthermia    Neurontin [Gabapentin]     Confusion    Codeine Nausea Only   Latex Rash   Sulfa Antibiotics  Other (See Comments)    Do not take per nephrology    Current Problems (verified) Patient Active Problem List   Diagnosis Date Noted   Incontinence of feces 12/23/2020   Interstitial cystitis 06/14/2020   Overweight (BMI 25.0-29.9) 06/13/2020   Former smoker (40 pack year, quit 2004) 08/30/2019   Atherosclerosis of aorta ([place]) 03/22/2019   Emphysema lung ([place])    Chronic diastolic CHF (congestive heart failure) ([place]) 03/11/2019   Osteopenia 01/16/2019   History of colon polyps 06/28/2018   CKD Stage 3 ([place]) 01/10/2015   IBS  01/10/2015   Hyperlipidemia, mixed 06/12/2014   Medication management 06/12/2014   LBBB (left bundle branch block) 07/24/2013   HTN (hypertension) 07/24/2013   Recurrent major depression ([place])    OCD (obsessive compulsive disorder)    Abnormal glucose    Migraines    Vitamin D deficiency    Hypothyroidism 04/05/2013    Screening Tests Immunization History  Administered Date(s) Administered   Influenza Split 01/31/2016, 05/12/2018, 03/17/2019   Influenza, High Dose Seasonal PF 03/22/2017, 02/16/2018, 03/22/2019   Influenza-Unspecified 01/31/2016, 02/09/2020   PFIZER(Purple Top)SARS-COV-2 Vaccination 07/05/2019, 07/26/2019, 03/04/2020    Pneumococcal Conjugate-13 03/22/2017, 06/29/2018   Pneumococcal Polysaccharide-23 02/12/1999, 06/29/2018   Td 04/27/2013   Zoster Recombinat (Shingrix) 06/14/2020, 08/28/2020   Zoster, Live 11/24/2005    Preventative care: Last colonoscopy: 10/2020 Dr. Fuller Plan, polyps, 5 year recall Last EGD: 02/2019  Last mammogram: 11/12/2020  Last breast MRI: 10/2018 Last pap smear/pelvic exam: 2018, s/p total hysterectomy, DONE  DEXA: 12/2018, R fem T -2.1 - will get 2023 - ordered to schedule  Former smoker, 40 pack year, quit 2004 Last CT chest 09/17/2020, emphysema, aortic atherosclerosis, no nodule Last CXR 08/2020  Last ECHO: 02/2019   Prior vaccinations: TD or Tdap: 2015  Influenza: 01/2020 Pneumococcal: 2000, 2020 Prevnar13:  2018 Shingles/Zostavax: 2007, 2/2 2022 Covid 19: 3/3, 2021, pfizer   Names of Other Physician/Practitioners you currently use: 1. [facility] here for primary care 2. Dr. Delman Cheadle, eye doctor, last visit 2021,  3. Dr. Woodfin Ganja, dentist, last visit 2020, wants to find a new provider who takes insurance   Patient Care Team: Unk [name], MD as PCP - General (Internal Medicine) Josue Hector, MD as PCP - Cardiology (Cardiology) Rockey Situ (Urology) Donato Heinz, MD as Consulting Physician (Nephrology) Melissa Noon, [place] as Referring Physician (Optometry) Ladene Artist, MD as Consulting Physician (Gastroenterology) Josue Hector, MD as Consulting Physician (Cardiology) Rush Landmark, [facility] as Pharmacist (Pharmacist)  SURGICAL HISTORY She  has a past surgical history that includes Tonsillectomy; Oophorectomy; Bunionectomy; Hemorrhoid surgery; Breast enhancement surgery; Colon surgery; ORIF ankle fracture (Left, 08/30/2017); Augmentation mammaplasty; Breast implant removal (Bilateral, 03/07/2019); Esophagogastroduodenoscopy (egd) with propofol (N/A, 03/12/2019); and Colonoscopy (2014). FAMILY HISTORY Her family  history includes Colon polyps in her mother; Heart block in her maternal grandmother; Muscular dystrophy in her maternal grandmother and mother. SOCIAL HISTORY She  reports that she quit smoking about 18 years ago. Her smoking use included cigarettes. She has a 40.00 pack-year smoking history. She has never used smokeless tobacco. She reports that she does not drink alcohol and does not use drugs.  Depression/mood screen:   Depression screen [facility] 2/9 12/23/2020  Decreased Interest 0  Down, Depressed, Hopeless 0  PHQ - 2 Score 0  Altered sleeping -  Tired, decreased energy -  Change in appetite -  Feeling bad or failure about yourself  -  Trouble concentrating -  Moving slowly  or fidgety/restless -  Suicidal thoughts -  PHQ-9 Score -  Difficult doing work/chores -  Some recent data might be hidden    ADLs:  In your present state of health, do you have any difficulty performing the following activities: 12/23/2020  Hearing? N  Vision? N  Difficulty concentrating or making decisions? N  Walking or climbing stairs? N  Dressing or bathing? N  Doing errands, shopping? N  Some recent data might be hidden      Cognitive Testing  Alert? Yes  Normal Appearance?Yes  Oriented to person? Yes  Place? Yes   Time? Yes  Recall of three objects?  Yes  Can perform simple calculations? Yes  Displays appropriate judgment?Yes  Can read the correct time from a watch face?Yes  EOL planning: Does Patient Have a Medical Advance Directive?: Yes Type of Advance Directive: Healthcare Power of Attorney, Living will Does patient want to make changes to medical advance directive?: No - Patient declined Copy of [place] in Chart?: No - copy requested   Review of Systems  Constitutional:  Negative for malaise/fatigue and weight loss.  HENT:  Negative for hearing loss and tinnitus.   Eyes:  Negative for blurred vision and double vision.  Respiratory:  Negative for cough, shortness of  breath and wheezing.   Cardiovascular:  Negative for chest pain, palpitations, orthopnea, claudication and leg swelling.  Gastrointestinal:  Positive for diarrhea. Negative for abdominal pain, blood in stool, constipation, heartburn, melena, nausea and vomiting.  Genitourinary: Negative.   Musculoskeletal:  Negative for falls, joint pain and myalgias.  Skin:  Negative for rash.  Neurological:  Negative for dizziness, tingling, sensory change, weakness and headaches.  Endo/Heme/Allergies:  Negative for polydipsia.  Psychiatric/Behavioral:  Negative for depression, memory loss, substance abuse and suicidal ideas. The patient is not nervous/anxious and does not have insomnia.   All other systems reviewed and are negative.   Objective:     Today's Vitals   12/23/20 0926  BP: (!) 102/56  Pulse: (!) 58  Temp: 97.7 F (36.5 C)  SpO2: 96%  Weight: 166 lb (75.3 kg)  Height: '5\' 3"'$  (1.6 m)   Body mass index is 29.41 kg/m.  General appearance: alert, no distress, WD/WN, female HEENT: normocephalic, sclerae anicteric, TMs pearly, nares patent, no discharge or erythema, pharynx normal Oral cavity: MMM, no lesions Neck: supple, no lymphadenopathy, no thyromegaly, no masses Heart: RRR, normal S1, S2, no murmurs Lungs: CTA bilaterally, no wheezes, rhonchi, or rales Abdomen: +bs, soft, non tender, non distended, no masses, no hepatomegaly, no splenomegaly. She has left inguinal hernia with stool. Non-tender, non-erythematous.  Musculoskeletal: nontender, no swelling, no obvious deformity Extremities: no edema, no cyanosis, no clubbing.  Pulses: 2+ symmetric, upper and lower extremities, normal cap refill Neurological: alert, oriented x 3, CN2-12 intact, strength normal upper extremities and lower extremities, sensation normal throughout, DTRs 2+ throughout, no cerebellar signs, gait normal Psychiatric: normal affect, behavior normal, pleasant   The patient's weight, height, BMI, and visual  acuity have been recorded in the chart.  I have made referrals, counseling, and provided education to the patient based on review of the above and I have provided the patient with a written personalized care plan for preventive services.     Izora Ribas, NP   12/23/2020

## 2020-12-23 ENCOUNTER — Ambulatory Visit (INDEPENDENT_AMBULATORY_CARE_PROVIDER_SITE_OTHER): Payer: PPO | Admitting: Adult Health

## 2020-12-23 ENCOUNTER — Other Ambulatory Visit: Payer: Self-pay

## 2020-12-23 ENCOUNTER — Encounter: Payer: Self-pay | Admitting: Adult Health

## 2020-12-23 ENCOUNTER — Other Ambulatory Visit: Payer: Self-pay | Admitting: Adult Health

## 2020-12-23 VITALS — BP 102/56 | HR 58 | Temp 97.7°F | Ht 63.0 in | Wt 166.0 lb

## 2020-12-23 DIAGNOSIS — K589 Irritable bowel syndrome without diarrhea: Secondary | ICD-10-CM

## 2020-12-23 DIAGNOSIS — Z0000 Encounter for general adult medical examination without abnormal findings: Secondary | ICD-10-CM

## 2020-12-23 DIAGNOSIS — Z0001 Encounter for general adult medical examination with abnormal findings: Secondary | ICD-10-CM | POA: Diagnosis not present

## 2020-12-23 DIAGNOSIS — I447 Left bundle-branch block, unspecified: Secondary | ICD-10-CM

## 2020-12-23 DIAGNOSIS — I5032 Chronic diastolic (congestive) heart failure: Secondary | ICD-10-CM | POA: Diagnosis not present

## 2020-12-23 DIAGNOSIS — J432 Centrilobular emphysema: Secondary | ICD-10-CM | POA: Diagnosis not present

## 2020-12-23 DIAGNOSIS — I700 Atherosclerosis of aorta: Secondary | ICD-10-CM

## 2020-12-23 DIAGNOSIS — I10 Essential (primary) hypertension: Secondary | ICD-10-CM

## 2020-12-23 DIAGNOSIS — Z86010 Personal history of colonic polyps: Secondary | ICD-10-CM

## 2020-12-23 DIAGNOSIS — R300 Dysuria: Secondary | ICD-10-CM | POA: Diagnosis not present

## 2020-12-23 DIAGNOSIS — N1831 Chronic kidney disease, stage 3a: Secondary | ICD-10-CM

## 2020-12-23 DIAGNOSIS — M8580 Other specified disorders of bone density and structure, unspecified site: Secondary | ICD-10-CM

## 2020-12-23 DIAGNOSIS — N3010 Interstitial cystitis (chronic) without hematuria: Secondary | ICD-10-CM

## 2020-12-23 DIAGNOSIS — Z87891 Personal history of nicotine dependence: Secondary | ICD-10-CM

## 2020-12-23 DIAGNOSIS — E782 Mixed hyperlipidemia: Secondary | ICD-10-CM

## 2020-12-23 DIAGNOSIS — R413 Other amnesia: Secondary | ICD-10-CM | POA: Diagnosis not present

## 2020-12-23 DIAGNOSIS — F3342 Major depressive disorder, recurrent, in full remission: Secondary | ICD-10-CM

## 2020-12-23 DIAGNOSIS — R7309 Other abnormal glucose: Secondary | ICD-10-CM | POA: Diagnosis not present

## 2020-12-23 DIAGNOSIS — Z79899 Other long term (current) drug therapy: Secondary | ICD-10-CM | POA: Diagnosis not present

## 2020-12-23 DIAGNOSIS — E039 Hypothyroidism, unspecified: Secondary | ICD-10-CM | POA: Diagnosis not present

## 2020-12-23 DIAGNOSIS — R159 Full incontinence of feces: Secondary | ICD-10-CM | POA: Insufficient documentation

## 2020-12-23 DIAGNOSIS — K580 Irritable bowel syndrome with diarrhea: Secondary | ICD-10-CM

## 2020-12-23 DIAGNOSIS — F429 Obsessive-compulsive disorder, unspecified: Secondary | ICD-10-CM

## 2020-12-23 DIAGNOSIS — G43909 Migraine, unspecified, not intractable, without status migrainosus: Secondary | ICD-10-CM | POA: Diagnosis not present

## 2020-12-23 DIAGNOSIS — R6889 Other general symptoms and signs: Secondary | ICD-10-CM | POA: Diagnosis not present

## 2020-12-23 DIAGNOSIS — Z8619 Personal history of other infectious and parasitic diseases: Secondary | ICD-10-CM | POA: Diagnosis not present

## 2020-12-23 DIAGNOSIS — E663 Overweight: Secondary | ICD-10-CM

## 2020-12-23 DIAGNOSIS — E559 Vitamin D deficiency, unspecified: Secondary | ICD-10-CM

## 2020-12-24 ENCOUNTER — Other Ambulatory Visit: Payer: Self-pay | Admitting: Adult Health

## 2020-12-24 DIAGNOSIS — R809 Proteinuria, unspecified: Secondary | ICD-10-CM

## 2020-12-24 DIAGNOSIS — E039 Hypothyroidism, unspecified: Secondary | ICD-10-CM

## 2020-12-24 MED ORDER — LEVOTHYROXINE SODIUM 125 MCG PO TABS: ORAL_TABLET | ORAL | 0 refills | Status: DC

## 2020-12-26 ENCOUNTER — Other Ambulatory Visit: Payer: Self-pay | Admitting: Adult Health

## 2020-12-26 LAB — COMPLETE METABOLIC PANEL WITH GFR
AG Ratio: 1.4 (calc) (ref 1.0–2.5)
ALT: 11 U/L (ref 6–29)
AST: 14 U/L (ref 10–35)
Albumin: 3.8 g/dL (ref 3.6–5.1)
Alkaline phosphatase (APISO): 69 U/L (ref 37–153)
BUN/Creatinine Ratio: 23 (calc) — ABNORMAL HIGH (ref 6–22)
BUN: 25 mg/dL (ref 7–25)
CO2: 30 mmol/L (ref 20–32)
Calcium: 9.7 mg/dL (ref 8.6–10.4)
Chloride: 103 mmol/L (ref 98–110)
Creat: 1.07 mg/dL — ABNORMAL HIGH (ref 0.50–1.05)
Globulin: 2.8 g/dL (calc) (ref 1.9–3.7)
Glucose, Bld: 90 mg/dL (ref 65–99)
Potassium: 5 mmol/L (ref 3.5–5.3)
Sodium: 140 mmol/L (ref 135–146)
Total Bilirubin: 0.6 mg/dL (ref 0.2–1.2)
Total Protein: 6.6 g/dL (ref 6.1–8.1)
eGFR: 56 mL/min/{1.73_m2} — ABNORMAL LOW (ref >=60)

## 2020-12-26 LAB — CBC WITH DIFFERENTIAL/PLATELET
Absolute Monocytes: 392 cells/uL (ref 200–950)
Basophils Absolute: 40 cells/uL (ref 0–200)
Basophils Relative: 0.9 %
Eosinophils Absolute: 312 cells/uL (ref 15–500)
Eosinophils Relative: 7.1 %
HCT: 41.5 % (ref 35.0–45.0)
Hemoglobin: 13.1 g/dL (ref 11.7–15.5)
Lymphs Abs: 1351 cells/uL (ref 850–3900)
MCH: 29.2 pg (ref 27.0–33.0)
MCHC: 31.6 g/dL — ABNORMAL LOW (ref 32.0–36.0)
MCV: 92.6 fL (ref 80.0–100.0)
MPV: 10.3 fL (ref 7.5–12.5)
Monocytes Relative: 8.9 %
Neutro Abs: 2306 cells/uL (ref 1500–7800)
Neutrophils Relative %: 52.4 %
Platelets: 272 10*3/uL (ref 140–400)
RBC: 4.48 10*6/uL (ref 3.80–5.10)
RDW: 13.1 % (ref 11.0–15.0)
Total Lymphocyte: 30.7 %
WBC: 4.4 10*3/uL (ref 3.8–10.8)

## 2020-12-26 LAB — FLUORESCENT TREPONEMAL AB(FTA)-IGG-BLD: Fluorescent Treponemal ABS: REACTIVE — AB

## 2020-12-26 LAB — URINALYSIS W MICROSCOPIC + REFLEX CULTURE
Bilirubin Urine: NEGATIVE
Glucose, UA: NEGATIVE
Hgb urine dipstick: NEGATIVE
Ketones, ur: NEGATIVE
Nitrites, Initial: NEGATIVE
Specific Gravity, Urine: 1.016 (ref 1.001–1.035)
Squamous Epithelial / HPF: NONE SEEN /HPF (ref <=5)
WBC, UA: >=60 /HPF — AB (ref 0–5)
pH: 7.5 (ref 5.0–8.0)

## 2020-12-26 LAB — URINE CULTURE
MICRO NUMBER:: 12305467
SPECIMEN QUALITY:: ADEQUATE

## 2020-12-26 LAB — RPR TITER: RPR Titer: 1:2 {titer} — ABNORMAL HIGH

## 2020-12-26 LAB — RPR: RPR Ser Ql: REACTIVE — AB

## 2020-12-26 LAB — LIPID PANEL
Cholesterol: 176 mg/dL (ref <200)
HDL: 63 mg/dL (ref >=50)
LDL Cholesterol (Calc): 94 mg/dL (calc)
Non-HDL Cholesterol (Calc): 113 mg/dL (calc) (ref <130)
Total CHOL/HDL Ratio: 2.8 (calc) (ref <5.0)
Triglycerides: 97 mg/dL (ref <150)

## 2020-12-26 LAB — CULTURE INDICATED

## 2020-12-26 LAB — TSH: TSH: 0.04 mIU/L — ABNORMAL LOW (ref 0.40–4.50)

## 2020-12-26 LAB — MAGNESIUM: Magnesium: 2.1 mg/dL (ref 1.5–2.5)

## 2020-12-26 MED ORDER — AMOXICILLIN-POT CLAVULANATE 875-125 MG PO TABS: 1.0000 | ORAL_TABLET | Freq: Two times a day (BID) | ORAL | 0 refills | Status: DC

## 2020-12-26 MED ORDER — CIPROFLOXACIN HCL 500 MG PO TABS
500.0000 mg | ORAL_TABLET | Freq: Two times a day (BID) | ORAL | 0 refills | Status: DC
Start: 2020-12-26 — End: 2020-12-31

## 2020-12-27 ENCOUNTER — Telehealth: Payer: Self-pay

## 2020-12-27 NOTE — Progress Notes (Signed)
   Name: [name]  MRN: [id number] DOB: 02/01/1951   Reason for Encounter: Chart Prep for CPP visit 12/31/20.   Conditions to be addressed/monitored: CHF, HTN, HLD, CKD Stage 3, Depression, and Migraines, Hypothyroidism, Osteopenia, Vitamin D deficiency   Recent office visits:  12/23/2020- Liane Comber, NP- Patient presented for Medicare wellness exam. BP 102/56, Pulse 58. Stopped Docusate sodium '100mg'$  and Turmeric.   Recent consult visits:  11/26/2020- Dr. Stechschulte(Surgery)- Patient presented for consult for Inguinal hernia and urinary incontinence. BP 130/80, Pulse 83. No medication changes.  Hospital visits:  None in previous 6 months  Medications: Outpatient Encounter Medications as of 12/27/2020  Medication Sig   albuterol (VENTOLIN HFA) 108 (90 Base) MCG/ACT inhaler Inhale 1-2 puffs into the lungs every 4 (four) hours as needed for wheezing or shortness of breath.   ALPRAZolam (XANAX) 0.5 MG tablet TAKE 1/2 TO 1 TABLET BY MOUTH THREE TIMES A DAY IF NEEDED FOR ANXIETY OR SLEEP   ascorbic acid (VITAMIN C) 250 MG tablet Take 250 mg by mouth daily.   aspirin EC 81 MG tablet Take 81 mg by mouth daily.    atenolol (TENORMIN) 100 MG tablet Take 1 tablet daily  for blood pressure (Patient taking differently: Take 1 tablet daily  for blood pressure)   buPROPion (WELLBUTRIN XL) 150 MG 24 hr tablet TAKE 3 TABLETS BY MOUTH EVERY MORNING FOR MOOD AND DEPRESSION   Cholecalciferol (VITAMIN D-3) 5000 UNITS TABS Take 5,000 Units by mouth daily.    ciprofloxacin (CIPRO) 500 MG tablet Take 1 tablet (500 mg total) by mouth 2 (two) times daily for 5 days.   famotidine (PEPCID) 20 MG tablet TAKE 1 TABLET BY MOUTH TWICE DAILY FOR INDIGESTION AND HEARTBURN   hyoscyamine (LEVSIN SL) 0.125 MG SL tablet DISSOLVE ONE TABLET UNDER THE TONGUE EVERY 4 HOURS IF NEEDED FOR NAUSEA, BLOATING, CRAMPING, OR DIARRHEA   ipratropium-albuterol (DUONEB) 0.5-2.5 (3) MG/3ML SOLN USE 1 VIAL IN NEBULIZER EVERY 4 HOURS  AS NEEDED **MAX 6 DOSES A DAY** (Patient not taking: No sig reported)   levothyroxine (SYNTHROID) 125 MCG tablet Take 1/2 tab on Mon/Thurs and 1 tablet  by mouth all other days on an empty stomach with only water for 30 minutes & no antacid meds, calcium or magnesium for 4 hours & avoid biotin   Magnesium 250 MG TABS Take 250 mg by mouth daily.   ondansetron (ZOFRAN) 8 MG tablet TAKE 1 TABLET BY MOUTH 3 TIMES A DAY AS NEEDED FOR NAUSEA   pantoprazole (PROTONIX) 40 MG tablet Take 1 tablet (40 mg total) by mouth daily.   promethazine (PHENERGAN) 25 MG tablet Take 1 tablet (25 mg total) by mouth every 6 (six) hours as needed for nausea or vomiting.   promethazine-dextromethorphan (PROMETHAZINE-DM) 6.25-15 MG/5ML syrup Take 5 mLs by mouth 4 (four) times daily as needed for cough.   sertraline (ZOLOFT) 100 MG tablet Take 1 tablet by mouth daily for mood   No facility-administered encounter medications on file as of 12/27/2020.    Care Gaps: Covid booster vaccine  Star Rating Drugs: None  Unable to connect with patient. Left voicemail with appointment details for 12/31/20 at 9am and to call if cannot keep appointment.   Hildred Alamin, Health Concierge [phone]

## 2020-12-31 ENCOUNTER — Other Ambulatory Visit (HOSPITAL_COMMUNITY): Payer: Self-pay

## 2020-12-31 ENCOUNTER — Other Ambulatory Visit: Payer: Self-pay

## 2020-12-31 ENCOUNTER — Other Ambulatory Visit: Payer: Self-pay | Admitting: Adult Health

## 2020-12-31 ENCOUNTER — Ambulatory Visit (INDEPENDENT_AMBULATORY_CARE_PROVIDER_SITE_OTHER): Payer: PPO | Admitting: Pharmacist

## 2020-12-31 DIAGNOSIS — J432 Centrilobular emphysema: Secondary | ICD-10-CM

## 2020-12-31 DIAGNOSIS — R7309 Other abnormal glucose: Secondary | ICD-10-CM

## 2020-12-31 DIAGNOSIS — E039 Hypothyroidism, unspecified: Secondary | ICD-10-CM | POA: Diagnosis not present

## 2020-12-31 DIAGNOSIS — E782 Mixed hyperlipidemia: Secondary | ICD-10-CM | POA: Diagnosis not present

## 2020-12-31 DIAGNOSIS — I447 Left bundle-branch block, unspecified: Secondary | ICD-10-CM

## 2020-12-31 DIAGNOSIS — Z87891 Personal history of nicotine dependence: Secondary | ICD-10-CM

## 2020-12-31 DIAGNOSIS — K219 Gastro-esophageal reflux disease without esophagitis: Secondary | ICD-10-CM

## 2020-12-31 DIAGNOSIS — I10 Essential (primary) hypertension: Secondary | ICD-10-CM

## 2020-12-31 DIAGNOSIS — I5032 Chronic diastolic (congestive) heart failure: Secondary | ICD-10-CM

## 2020-12-31 DIAGNOSIS — E559 Vitamin D deficiency, unspecified: Secondary | ICD-10-CM

## 2020-12-31 DIAGNOSIS — I700 Atherosclerosis of aorta: Secondary | ICD-10-CM

## 2020-12-31 DIAGNOSIS — N1831 Chronic kidney disease, stage 3a: Secondary | ICD-10-CM

## 2020-12-31 DIAGNOSIS — M8580 Other specified disorders of bone density and structure, unspecified site: Secondary | ICD-10-CM

## 2020-12-31 DIAGNOSIS — F3342 Major depressive disorder, recurrent, in full remission: Secondary | ICD-10-CM | POA: Diagnosis not present

## 2020-12-31 DIAGNOSIS — G43909 Migraine, unspecified, not intractable, without status migrainosus: Secondary | ICD-10-CM

## 2020-12-31 DIAGNOSIS — Z79899 Other long term (current) drug therapy: Secondary | ICD-10-CM

## 2020-12-31 MED ORDER — ALPRAZOLAM 0.5 MG PO TABS
ORAL_TABLET | ORAL | 0 refills | Status: DC
  Filled 2020-12-31: qty 30, 30d supply, fill #0

## 2020-12-31 NOTE — Progress Notes (Signed)
Future Appointments  Date Time Provider [place]  04/29/2021 11:00 AM Rush Landmark, [facility] None  06/13/2021 10:00 AM Liane Comber, NP [facility] None  12/23/2021 10:30 AM Liane Comber, NP [facility] None    PDMP reviewed for alprazolam refill request.

## 2020-12-31 NOTE — Progress Notes (Signed)
Chronic Care Management Pharmacy Note  01/07/2021 Name:  [name] MRN:  [id number] DOB:  [birth year]  Summary: Patient is doing well. Currently going through situational stressors worsening her anxiety at times.  Recommendations/Changes made from today's visit: Recommended RPM weight scale for CHF monitoring Working with patient on PPI step down therapy  Patient to call if anxiety worsens   Subjective: [name] is an 70 y.o. year old female who is a primary patient of Unk [name], MD.  The CCM team was consulted for assistance with disease management and care coordination needs.    Engaged with patient face to face for initial visit in response to provider referral for pharmacy case management and/or care coordination services.   Consent to Services:  The patient was given information about Chronic Care Management services, agreed to services, and gave verbal consent prior to initiation of services.  Please see initial visit note for detailed documentation.   Patient Care Team: Unk [name], MD as PCP - General (Internal Medicine) Josue Hector, MD as PCP - Cardiology (Cardiology) Rockey Situ (Urology) Donato Heinz, MD as Consulting Physician (Nephrology) Melissa Noon, [place] as Referring Physician (Optometry) Ladene Artist, MD as Consulting Physician (Gastroenterology) Josue Hector, MD as Consulting Physician (Cardiology) Rush Landmark, [facility] as Pharmacist (Pharmacist)  Recent office visits: 12/23/2020- Liane Comber, NP- Patient presented for Medicare wellness exam. BP 102/56, Pulse 58. Stopped Docusate sodium 177m and Turmeric.   Recent consult visits: 11/26/2020- Dr. Stechschulte(Surgery)- Patient presented for consult for Inguinal hernia and urinary incontinence. BP 130/80, Pulse 83. No medication changes.  Hospital visits: None in previous 6 months   Objective:  Lab Results  Component Value Date   CREATININE 1.07 (H)  12/23/2020   BUN 25 12/23/2020   GFRNONAA 57 (L) 06/14/2020   GFRAA 66 06/14/2020   NA 140 12/23/2020   K 5.0 12/23/2020   CALCIUM 9.7 12/23/2020   CO2 30 12/23/2020   GLUCOSE 90 12/23/2020    Lab Results  Component Value Date/Time   HGBA1C 5.4 06/14/2020 10:49 AM   HGBA1C 5.5 11/29/2019 10:24 AM   MICROALBUR 0.3 06/14/2020 10:49 AM   MICROALBUR 0.7 05/25/2019 04:09 PM    Last diabetic Eye exam: No results found for: HMDIABEYEEXA  Last diabetic Foot exam: No results found for: HMDIABFOOTEX   Lab Results  Component Value Date   CHOL 176 12/23/2020   HDL 63 12/23/2020   LDLCALC 94 12/23/2020   TRIG 97 12/23/2020   CHOLHDL 2.8 12/23/2020    Hepatic Function Latest Ref Rng & Units 12/23/2020 06/14/2020 03/04/2020  Total Protein 6.1 - 8.1 g/dL 6.6 6.7 6.5  Albumin 3.5 - 5.0 g/dL - - -  AST 10 - 35 U/L _0 ALT 6 - 29 U/L _1 Alk Phosphatase 38 - 126 U/L - - -  Total Bilirubin 0.2 - 1.2 mg/dL 0.6 0.4 0.4  Bilirubin, Direct 0.0 - 0.2 mg/dL - - -    Lab Results  Component Value Date/Time   TSH 0.04 (L) 12/23/2020 10:40 AM   TSH 0.11 (L) 06/14/2020 10:49 AM    CBC Latest Ref Rng & Units 12/23/2020 06/14/2020 03/04/2020  WBC 3.8 - 10.8 Thousand/uL 4.4 4.5 4.5  Hemoglobin 11.7 - 15.5 g/dL 13.1 13.1 13.8  Hematocrit 35.0 - 45.0 % 41.5 39.7 41.0  Platelets 140 - 400 Thousand/uL 272 299 302    Lab Results  Component Value Date/Time   VD25OH 84 06/14/2020 10:49 AM  VD25OH 75 05/25/2019 04:09 PM    Clinical ASCVD: Yes  The 10-year ASCVD risk score (Arnett DK, et al., 2019) is: 6.8%   Values used to calculate the score:     Age: 4 years     Sex: Female     Is Non-Hispanic African American: No     Diabetic: No     Tobacco smoker: No     Systolic Blood Pressure: 369 mmHg     Is BP treated: Yes     HDL Cholesterol: 63 mg/dL     Total Cholesterol: 176 mg/dL    Depression screen [facility] 2/9 12/23/2020 06/14/2020 11/29/2019  Decreased Interest 0 0 0  Down, Depressed,  Hopeless 0 0 0  PHQ - 2 Score 0 0 0  Altered sleeping - 1 -  Tired, decreased energy - 0 -  Change in appetite - 0 -  Feeling bad or failure about yourself  - 0 -  Trouble concentrating - 0 -  Moving slowly or fidgety/restless - 0 -  Suicidal thoughts - 0 -  PHQ-9 Score - 1 -  Difficult doing work/chores - Not difficult at all -  Some recent data might be hidden     See below as applicable Other: (QOHCO9TVMT if Afib, MMRC or CAT for COPD, ACT, DEXA)  Social History   Tobacco Use  Smoking Status Former   Packs/day: 1.00   Years: 40.00   Pack years: 40.00   Types: Cigarettes   Quit date: 04/27/2002   Years since quitting: 18.7  Smokeless Tobacco Never   BP Readings from Last 3 Encounters:  12/23/20 (!) 102/56  11/14/20 (!) 97/56  08/28/20 100/66   Pulse Readings from Last 3 Encounters:  12/23/20 (!) 58  11/14/20 (!) 59  08/28/20 75   Wt Readings from Last 3 Encounters:  12/23/20 166 lb (75.3 kg)  11/14/20 168 lb (76.2 kg)  08/28/20 168 lb 6.4 oz (76.4 kg)   BMI Readings from Last 3 Encounters:  12/23/20 29.41 kg/m  11/14/20 29.76 kg/m  08/28/20 29.83 kg/m    Assessment/Interventions: Review of patient past medical history, allergies, medications, health status, including review of consultants reports, laboratory and other test data, was performed as part of comprehensive evaluation and provision of chronic care management services.   SDOH:  (Social Determinants of Health) assessments and interventions performed: Yes SDOH Interventions    Flowsheet Row Most Recent Value  SDOH Interventions   Housing Interventions Intervention Not Indicated  Transportation Interventions Intervention Not Indicated      SDOH Screenings   Alcohol Screen: Not on file  Depression (PHQ2-9): Low Risk    PHQ-2 Score: 0  Financial Resource Strain: Not on file  Food Insecurity: Not on file  Housing: [place] Risk Score: 0  Physical Activity: Not on file   Social Connections: Not on file  Stress: Not on file  Tobacco Use: Medium Risk   Smoking Tobacco Use: Former   Smokeless Tobacco Use: Never  Transportation Needs: No Transportation Needs   Lack of Transportation (Medical): No   Lack of Transportation (Non-Medical): No    CCM Care Plan  Allergies  Allergen Reactions   Ace Inhibitors Other (See Comments)    Acute renal failure (lisinopril)   Cephalexin Anaphylaxis   Dilaudid [Hydromorphone Hcl] Anaphylaxis   Robaxin [Methocarbamol] Anaphylaxis   Macrobid [Nitrofurantoin] Other (See Comments)    Causes hyperthermia    Neurontin [Gabapentin]     Confusion  Codeine Nausea Only   Latex Rash   Sulfa Antibiotics Other (See Comments)    Do not take per nephrology    Medications Reviewed Today     Reviewed by Rush Landmark, [facility] (Pharmacist) on 12/31/20 at [phone]  Med List Status: <None>   Medication Order Taking? Sig Documenting Provider Last Dose Status Informant  albuterol (VENTOLIN HFA) 108 (90 Base) MCG/ACT inhaler [id number] Yes Inhale 1-2 puffs into the lungs every 4 (four) hours as needed for wheezing or shortness of breath. Liane Comber, NP Taking Active   ALPRAZolam Duanne Moron) 0.5 MG tablet [id number] Yes TAKE 1/2 TO 1 TABLET BY MOUTH THREE TIMES A DAY IF NEEDED FOR ANXIETY OR SLEEP Liane Comber, NP Taking Active   ascorbic acid (VITAMIN C) 250 MG tablet [id number] Yes Take 500 mg by mouth daily. [provider] Taking Active   aspirin EC 81 MG tablet [id number] Yes Take 81 mg by mouth daily.  [provider] Taking Active Self           Med Note Berline Lopes, MELISSA   Fri Oct 27, 2019 10:03 AM)    aspirin-acetaminophen-caffeine (EXCEDRIN MIGRAINE) [phone] MG tablet [id number] Yes Take 1 tablet by mouth every 6 (six) hours as needed for headache. [provider] Taking Active   atenolol (TENORMIN) 100 MG tablet [id number] Yes Take 1 tablet daily  for blood pressure  Patient taking differently: Take 1  tablet daily  for blood pressure   Unk [name], MD Taking Active   Budeson-Glycopyrrol-Formoterol (BREZTRI AEROSPHERE) 160-9-4.8 MCG/ACT AERO [id number] No Inhale into the lungs.  Patient not taking: Reported on 12/31/2020   [provider] Not Taking Active   buPROPion (WELLBUTRIN XL) 150 MG 24 hr tablet [id number] Yes TAKE 3 TABLETS BY MOUTH EVERY MORNING FOR MOOD AND DEPRESSION Liane Comber, NP Taking Active   Ca Carbonate-Mag Hydroxide (ROLAIDS EXTRA STRENGTH) 675-135 MG CHEW [id number] Yes Chew 1 tablet by mouth daily. [provider] Taking Active   cetirizine (ZYRTEC) 10 MG tablet [id number] Yes Take 10 mg by mouth daily. [provider] Taking Active   Cholecalciferol (VITAMIN D-3) 5000 UNITS TABS [id number] Yes Take 5,000 Units by mouth daily.  [provider] Taking Active Self           Med Note [facility], KRYSTOPHER   Thu Aug 19, 2017  5:27 PM)    Cinnamon 500 MG capsule [id number] Yes Take 500 mg by mouth daily. [provider] Taking Active   diphenhydrAMINE (BENADRYL) 25 MG tablet [id number] Yes Take 25 mg by mouth every 6 (six) hours as needed. [provider] Taking Active   famotidine (PEPCID) 20 MG tablet [id number] Yes TAKE 1 TABLET BY MOUTH TWICE DAILY FOR INDIGESTION AND HEARTBURN Liane Comber, NP Taking Active   guaifenesin (HUMIBID E) 400 MG TABS tablet [id number] Yes Take 400 mg by mouth daily. [provider] Taking Active   hyoscyamine (LEVSIN SL) 0.125 MG SL tablet [id number] Yes DISSOLVE ONE TABLET UNDER THE TONGUE EVERY 4 HOURS IF NEEDED FOR NAUSEA, BLOATING, CRAMPING, OR DIARRHEA Demetra Shiner, [place]send Roger, NP Taking Active   ipratropium-albuterol (DUONEB) 0.5-2.5 (3) MG/3ML SOLN [id number] Yes USE 1 VIAL IN NEBULIZER EVERY 4 HOURS AS NEEDED **MAX 6 DOSES A DAYLiane Comber, NP Taking Active   levothyroxine (SYNTHROID) 125 MCG tablet [id number] Yes Take 1/2 tab on Mon/Thurs and 1 tablet  by mouth all other days on an empty  stomach with only water for 30 minutes & no antacid meds, calcium or magnesium  for 4 hours & avoid biotin Liane Comber, NP Taking Active   Magnesium 250 MG TABS [id number] Yes Take 250 mg by mouth daily. [provider] Taking Active Self  ondansetron (ZOFRAN) 8 MG tablet [id number] Yes TAKE 1 TABLET BY MOUTH 3 TIMES A DAY AS NEEDED FOR NAUSEA Liane Comber, NP Taking Active   pantoprazole (PROTONIX) 40 MG tablet [id number] Yes Take 1 tablet (40 mg total) by mouth daily. Liane Comber, NP Taking Active   promethazine-dextromethorphan (PROMETHAZINE-DM) 6.25-15 MG/5ML syrup [id number] Yes Take 5 mLs by mouth 4 (four) times daily as needed for cough. Liane Comber, NP Taking Active   sertraline (ZOLOFT) 100 MG tablet [id number] Yes Take 1 tablet by mouth daily for mood Unk [name], MD Taking Active   Turmeric 500 MG CAPS [id number] Yes Take 1 tablet by mouth daily. [provider] Taking Active   vitamin B-12 (CYANOCOBALAMIN) 1000 MCG tablet [id number] Yes Take 1,000 mcg by mouth. Every 4 days [provider] Taking Active   vitamin E 1000 UNIT capsule [id number] Yes Take 1,000 Units by mouth daily. [provider] Taking Active             Patient Active Problem List   Diagnosis Date Noted   Incontinence of feces 12/23/2020   Interstitial cystitis 06/14/2020   Overweight (BMI 25.0-29.9) 06/13/2020   Former smoker (40 pack year, quit 2004) 08/30/2019   Atherosclerosis of aorta ([place]) 03/22/2019   Emphysema lung ([place])    Chronic diastolic CHF (congestive heart failure) ([place]) 03/11/2019   Osteopenia 01/16/2019   History of colon polyps 06/28/2018   CKD Stage 3 ([place]) 01/10/2015   IBS  01/10/2015   Hyperlipidemia, mixed 06/12/2014   Medication management 06/12/2014   LBBB (left bundle branch block) 07/24/2013   HTN (hypertension) 07/24/2013   Major depression, recurrent, full remission (HCC)    OCD (obsessive compulsive disorder)    Abnormal  glucose    Migraines    Vitamin D deficiency    Hypothyroidism 04/05/2013    Immunization History  Administered Date(s) Administered   Influenza Split 01/31/2016, 05/12/2018, 03/17/2019   Influenza, High Dose Seasonal PF 03/22/2017, 02/16/2018, 03/22/2019   Influenza-Unspecified 01/31/2016, 02/09/2020   PFIZER(Purple Top)SARS-COV-2 Vaccination 07/05/2019, 07/26/2019, 03/04/2020   Pneumococcal Conjugate-13 03/22/2017, 06/29/2018   Pneumococcal Polysaccharide-23 02/12/1999, 06/29/2018   Td 04/27/2013   Zoster Recombinat (Shingrix) 06/14/2020, 08/28/2020   Zoster, Live 11/24/2005    Conditions to be addressed/monitored:  Hypertension, Hyperlipidemia, Diabetes, Heart Failure, Coronary Artery Disease, GERD, COPD, Chronic Kidney Disease, Hypothyroidism, Depression, Anxiety, Osteopenia, and vitamin D def, migraines  Care Plan : General Pharmacy (Adult)  Updates made by Rush Landmark, [place] since 01/07/2021 12:00 AM     Problem: Hypertension, Hyperlipidemia, Diabetes, Heart Failure, Coronary Artery Disease, GERD, COPD, Chronic Kidney Disease, Hypothyroidism, Depression, Anxiety, Osteopenia, and vitamin D def, migraines      Long-Range Goal: Disease managment   Start Date: 12/31/2020  Expected End Date: 12/31/2021  Priority: High  Note:    Current Barriers:  None  Pharmacist Clinical Goal(s):  Patient will achieve adherence to monitoring guidelines and medication adherence to achieve therapeutic efficacy through collaboration with PharmD and provider.   Interventions: 1:1 collaboration with Unk [name], MD regarding development and update of comprehensive plan of care as evidenced by provider attestation and co-signature Inter-disciplinary care team collaboration (see longitudinal plan of care) Comprehensive medication review performed; medication list updated in electronic medical record  Hypertension (BP goal <130/80) -Controlled -Current treatment: Atenolol 100 mg 1 to  1/2  tablet daily (appropriate, effective, safe, accessible) Patient has history of arhythmia, LBBB -Medications previously tried: ACEi -Current home readings: Not checking -Current dietary habits: balanced diet, mindful, limits salt -Current exercise habits: None -Denies hypotensive/hypertensive symptoms Takes 1 to 1/2 tablet based on fatigue and hypotension symptoms -Educated on BP goals and benefits of medications for prevention of heart attack, stroke and kidney damage; Daily salt intake goal < 2300 mg; Exercise goal of 150 minutes per week; Proper BP monitoring technique; Symptoms of hypotension and importance of maintaining adequate hydration; -Counseled to monitor BP at home 2-3 x/ week, document, and provide log at future appointments -Counseled on diet and exercise extensively Recommended to continue current medication  Hyperlipidemia: (LDL goal < 70) -Uncontrolled Atherosclerosis of aorta, coronary calcification seen on chest CT -Current treatment: None -Medications previously tried: N/A  -Current dietary patterns: No restrictions related to HLD -Current exercise habits: None -Educated on Cholesterol goals;  Benefits of statin for ASCVD risk reduction; Importance of limiting foods high in cholesterol; Exercise goal of 150 minutes per week; -Counseled on diet and exercise extensively Counseled on lifestyle modifications, patient does not wish to make medication changes at this time  Abnormal Glucose (A1c goal < 5.7) A1C: 5.4 -Controlled -Current medications: None -Medications previously tried: N/A  -Current home glucose readings: Not checking -Denies hypoglycemic/hyperglycemic symptoms -Current meal patterns: balanced diet -Current exercise: None -Educated on A1c and blood sugar goals; Complications of diabetes including kidney damage, retinal damage, and cardiovascular disease; Benefits of weight loss; Prevention and management of hypoglycemic episodes; -Counseled  to check feet daily and get yearly eye exams -Counseled on diet and exercise extensively  Heart Failure (Goal: manage symptoms and prevent exacerbations) -Controlled Dx 02/2019 in setting of sepsis and anaphylactic reaction -Last ejection fraction: 55-60% (Date: 02/2019) -HF type: Diastolic -NYHA Class: I (no actitivty limitation) -AHA HF Stage: A (HF risk factors present) -Current treatment: None -Medications previously tried: N/A  -Current home BP/HR readings: Not checking -Current dietary habits: Limits salt -Current exercise habits: None Patient does not report edema, shortness of breath upon exertion or any other sx at this time -Educated on Importance of weighing daily; if you gain more than 3 pounds in one day or 5 pounds in one week, call CPP, PCP or cardiologist Importance of blood pressure control -Counseled on diet and exercise extensively Recommended checking weight daily using RPM scale Followed by cardiologist, Dr. Cathie Olden  COPD (Goal: control symptoms and prevent exacerbations)/ Emphysema  -Uncontrolled Patient is a former smoker (quit 2004) Patient has a 'few' episodes (2-3x/ year) and needs albuterol Does not feel like Lavonia Drafts is helping and expresses cost concern but did not want to discuss alternatives at this time -Current treatment  Data processing manager (appropriate, query effective) Ventolin PRN (appropriate, effective, safe, accessible) Ipratropium-albuterol nev sol PRN (appropriate, effective, safe, accessible) -Medications previously tried: N/A  -Current COPD Classification:  A (low sx, <2 exacerbations/yr) -Pulmonary function testing: N/A -Exacerbations requiring treatment in last 6 months: 0 -Patient denies consistent use of maintenance inhaler -Frequency of rescue inhaler use: Frequently during episodes (2-3x/ year) -Counseled on Proper inhaler technique; Benefits of consistent maintenance inhaler use When to use rescue inhaler Differences between  maintenance and rescue inhalers -Recommended to continue current medication Recommended starting Breztri regularly to see full impact   Depression/Anxiety -Uncontrolled anxiety -Controlled Depression -Current treatment: Bupropion XL 150mg  2 qam (appropriate, effective, safe, accessible) Sertraline 100mg  daily (appropriate, query) Alrpazolam 0.5mg  1/2 to 1 tablet daily PRN (appropriate, effective, safe, accessible) -  Medications previously tried/failed: N/A -PHQ9: 0 (PHQ-2 12/23/2020) -GAD7: 6 (12/31/2020) Overall states anxiety has worsened over the last 3 months (situational) Daughter and granddaughter have moved but have left a lot of belongings behind. Patient currently has a pest problem that she is resolving. Requesting refill for alprazolam. Takes 1 tablet every 2 months on average. Vial patient is presenting is from 2020 (expired)  Depression has stayed the same over the last 3 months -Educated on Benefits of medication for symptom control/ -Recommended to continue current medication Counseled on benefits of CBT (patient not intrested at this time) Patient instructed to call if anxiety worsens  Osteopenia / Vit D Def -Controlled 01/15/2021 Indications: Bilateral Ovariectomy (65.51), Caucasian, COPD, Estrogen Deficient, Hypothyroid, Hysterectomy, kidney disease, Levothyroxine, Pepcid, Postmenopausal, Wellbutrin Fractures: Ankle, -Last DEXA Scan: 01/16/2019  DualFemur Total Right 01/16/2019    67.5         (-2.1)     AP Spine L2-L4   01/16/2019  (-0.1) AP Spine  L2-L4       05/15/2008     (-1.0 )      DualFemur Neck Left 01/16/2019 (-1.9 ) DualFemur Neck Left   05/15/2008      (-1.6 )   10-year probability of major osteoporotic fracture: 10.9%  10-year probability of hip fracture: 1.7% -Patient is not a candidate for pharmacologic treatment Vitamin D: 84 (2022) -Current treatment  Vitamin D 5000 IU daily (appropriate, effective, safe, accessible) Calcium Carbonate 675 daily  (appropriate, effective, safe, accessible) - Rolaids -Medications previously tried: N/A  -Recommend [phone] units of vitamin D daily. Recommend 1200 mg of calcium daily from dietary and supplemental sources. Recommend weight-bearing and muscle strengthening exercises for building and maintaining bone density. -Counseled on diet and exercise extensively Recommended to continue current medication  Chronic Kidney Disease Stage 3a  -All medications assessed for renal dosing and appropriateness in chronic kidney disease. GFR: 57 06-14-2020 -Counseled on diet and exercise extensively Recommended avoiding NSAID and salt. Importance of BP control   GERD  -Controlled -Current treatment  Pantoprazole 40 mg daily (appropriate, effective, query safe) Pepcid $RemoveBefo'20mg'SvanaZnANUd$  daily (appropriate, effective, safe, accessible) -Medications previously tried: N/A  -Counseled on diet and exercise extensively Recommended to continue current medication Recommended alternating pantoprazole and pepcid (can take up to pepcid 40 mg BID)  Hypothyroid -Uncontrolled -Current treatment  Levothyroxine 125 mcg, 1/2 tablet M/Th, 1 tablet all other days (appropriate, effective, safe, accessible) TSH: 0.11 (12/23/2020), dose was reduced -Medications previously tried: N/A  -Recommended to continue current medication Discussed s/sx of thyroid imbalance  Migraines  -Controlled -Current treatment  None -Medications previously tried: N/A  Patient does not report having migraines in the past -Recommended calling CPP if migraines occur   Health Maintenance -Vaccine gaps: COVID booster (discussed with patient)   Patient Goals/Self-Care Activities Patient will:  - take medications as prescribed  Follow Up Plan: Telephone follow up appointment with care management team member scheduled for: 04/29/21       Medication Assistance: None required.  Patient affirms current coverage meets  needs.  Compliance/Adherence/Medication fill history: Care Gaps: Covid booster vaccine  Star-Rating Drugs: None  Patient's preferred pharmacy is:  [facility] [address] Phone: [phone] Fax: [phone]  [facility] [id number] - [place], [place]. [place] [address] Phone: [phone] Fax: [phone]  Uses pill box? Yes Pt endorses 100% compliance  We discussed: Benefits of medication synchronization, packaging and delivery as  well as enhanced pharmacist oversight with [employer]. Patient decided to: Continue current medication management strategy  Care Plan and Follow Up Patient Decision:  Patient agrees to Care Plan and Follow-up.  Plan: Telephone follow up appointment with care management team member scheduled for:  04/29/2021

## 2021-01-01 ENCOUNTER — Other Ambulatory Visit (HOSPITAL_COMMUNITY): Payer: Self-pay

## 2021-01-07 NOTE — Patient Instructions (Signed)
Visit Information   PATIENT GOALS:   Goals Addressed             This Visit's Progress    Track and Manage Fluids and Swelling-Heart Failure       Timeframe:  Long-Range Goal Priority:  High Start Date:                             Expected End Date:                       Follow Up Date 04/29/2021    - track weight in diary - use salt in moderation - watch for swelling in feet, ankles and legs every day - weigh myself daily    Why is this important?   It is important to check your weight daily and watch how much salt and liquids you have.  It will help you to manage your heart failure.    Notes:      Track and Manage My Blood Pressure-Hypertension       Timeframe:  Long-Range Goal Priority:  High Start Date:                             Expected End Date:                       Follow Up Date 04/29/2021    - check blood pressure 3 times per week - choose a place to take my blood pressure (home, clinic or office, retail store) - write blood pressure results in a log or diary    Why is this important?   You won't feel high blood pressure, but it can still hurt your blood vessels.  High blood pressure can cause heart or kidney problems. It can also cause a stroke.  Making lifestyle changes like losing a little weight or eating less salt will help.  Checking your blood pressure at home and at different times of the day can help to control blood pressure.  If the doctor prescribes medicine remember to take it the way the doctor ordered.  Call the office if you cannot afford the medicine or if there are questions about it.     Notes:         Consent to CCM Services: [name] was given information about Chronic Care Management services including:  CCM service includes personalized support from designated clinical staff supervised by her physician, including individualized plan of care and coordination with other care providers 24/7 contact phone numbers for assistance  for urgent and routine care needs. Service will only be billed when office clinical staff spend 20 minutes or more in a month to coordinate care. Only one practitioner may furnish and bill the service in a calendar month. The patient may stop CCM services at any time (effective at the end of the month) by phone call to the office staff. The patient will be responsible for cost sharing (co-pay) of up to 20% of the service fee (after annual deductible is met).  Patient agreed to services and verbal consent obtained.   The patient verbalized understanding of instructions, educational materials, and care plan provided today and agreed to receive a mailed copy of patient instructions, educational materials, and care plan.   Telephone follow up appointment with care management team member scheduled for: 04/29/2021  Signature: Dolphus Jenny  Cruz Condon, PharmD Clinical Pharmacist [email or web address] [phone]   CLINICAL CARE PLAN: Patient Care Plan: General Pharmacy (Adult)     Problem Identified: Hypertension, Hyperlipidemia, Diabetes, Heart Failure, Coronary Artery Disease, GERD, COPD, Chronic Kidney Disease, Hypothyroidism, Depression, Anxiety, Osteopenia, and vitamin D def, migraines      Long-Range Goal: Disease managment   Start Date: 12/31/2020  Expected End Date: 12/31/2021  Priority: High  Note:    Current Barriers:  None  Pharmacist Clinical Goal(s):  Patient will achieve adherence to monitoring guidelines and medication adherence to achieve therapeutic efficacy through collaboration with PharmD and provider.   Interventions: 1:1 collaboration with Unk [name], MD regarding development and update of comprehensive plan of care as evidenced by provider attestation and co-signature Inter-disciplinary care team collaboration (see longitudinal plan of care) Comprehensive medication review performed; medication list updated in electronic medical record  Hypertension (BP goal  <130/80) -Controlled -Current treatment: Atenolol 100 mg 1 to 1/2 tablet daily (appropriate, effective, safe, accessible) Patient has history of arhythmia, LBBB -Medications previously tried: ACEi -Current home readings: Not checking -Current dietary habits: balanced diet, mindful, limits salt -Current exercise habits: None -Denies hypotensive/hypertensive symptoms Takes 1 to 1/2 tablet based on fatigue and hypotension symptoms -Educated on BP goals and benefits of medications for prevention of heart attack, stroke and kidney damage; Daily salt intake goal < 2300 mg; Exercise goal of 150 minutes per week; Proper BP monitoring technique; Symptoms of hypotension and importance of maintaining adequate hydration; -Counseled to monitor BP at home 2-3 x/ week, document, and provide log at future appointments -Counseled on diet and exercise extensively Recommended to continue current medication  Hyperlipidemia: (LDL goal < 70) -Uncontrolled Atherosclerosis of aorta, coronary calcification seen on chest CT -Current treatment: None -Medications previously tried: N/A  -Current dietary patterns: No restrictions related to HLD -Current exercise habits: None -Educated on Cholesterol goals;  Benefits of statin for ASCVD risk reduction; Importance of limiting foods high in cholesterol; Exercise goal of 150 minutes per week; -Counseled on diet and exercise extensively Counseled on lifestyle modifications, patient does not wish to make medication changes at this time  Abnormal Glucose (A1c goal < 5.7) A1C: 5.4 -Controlled -Current medications: None -Medications previously tried: N/A  -Current home glucose readings: Not checking -Denies hypoglycemic/hyperglycemic symptoms -Current meal patterns: balanced diet -Current exercise: None -Educated on A1c and blood sugar goals; Complications of diabetes including kidney damage, retinal damage, and cardiovascular disease; Benefits of weight  loss; Prevention and management of hypoglycemic episodes; -Counseled to check feet daily and get yearly eye exams -Counseled on diet and exercise extensively  Heart Failure (Goal: manage symptoms and prevent exacerbations) -Controlled Dx 02/2019 in setting of sepsis and anaphylactic reaction -Last ejection fraction: 55-60% (Date: 02/2019) -HF type: Diastolic -NYHA Class: I (no actitivty limitation) -AHA HF Stage: A (HF risk factors present) -Current treatment: None -Medications previously tried: N/A  -Current home BP/HR readings: Not checking -Current dietary habits: Limits salt -Current exercise habits: None Patient does not report edema, shortness of breath upon exertion or any other sx at this time -Educated on Importance of weighing daily; if you gain more than 3 pounds in one day or 5 pounds in one week, call CPP, PCP or cardiologist Importance of blood pressure control -Counseled on diet and exercise extensively Recommended checking weight daily using RPM scale Followed by cardiologist, Dr. Cathie Olden  COPD (Goal: control symptoms and prevent exacerbations)/ Emphysema  -Uncontrolled Patient is a former smoker (quit 2004) Patient has a 'few' episodes (2-3x/  year) and needs albuterol Does not feel like Lavonia Drafts is helping and expresses cost concern but did not want to discuss alternatives at this time -Current treatment  Breztri Aerosphere (appropriate, query effective) Ventolin PRN (appropriate, effective, safe, accessible) Ipratropium-albuterol nev sol PRN (appropriate, effective, safe, accessible) -Medications previously tried: N/A  -Current COPD Classification:  A (low sx, <2 exacerbations/yr) -Pulmonary function testing: N/A -Exacerbations requiring treatment in last 6 months: 0 -Patient denies consistent use of maintenance inhaler -Frequency of rescue inhaler use: Frequently during episodes (2-3x/ year) -Counseled on Proper inhaler technique; Benefits of consistent  maintenance inhaler use When to use rescue inhaler Differences between maintenance and rescue inhalers -Recommended to continue current medication Recommended starting Breztri regularly to see full impact   Depression/Anxiety -Uncontrolled anxiety -Controlled Depression -Current treatment: Bupropion XL 116m 2 qam (appropriate, effective, safe, accessible) Sertraline 1068mdaily (appropriate, query) Alrpazolam 0.65m40m/2 to 1 tablet daily PRN (appropriate, effective, safe, accessible) -Medications previously tried/failed: N/A -PHQ9: 0 (PHQ-2 12/23/2020) -GAD7: 6 (12/31/2020) Overall states anxiety has worsened over the last 3 months (situational) Daughter and granddaughter have moved but have left a lot of belongings behind. Patient currently has a pest problem that she is resolving. Requesting refill for alprazolam. Takes 1 tablet every 2 months on average. Vial patient is presenting is from 2020 (expired)  Depression has stayed the same over the last 3 months -Educated on Benefits of medication for symptom control/ -Recommended to continue current medication Counseled on benefits of CBT (patient not intrested at this time) Patient instructed to call if anxiety worsens  Osteopenia / Vit D Def -Controlled 01/15/2021 Indications: Bilateral Ovariectomy (65.51), Caucasian, COPD, Estrogen Deficient, Hypothyroid, Hysterectomy, kidney disease, Levothyroxine, Pepcid, Postmenopausal, Wellbutrin Fractures: Ankle, -Last DEXA Scan: 01/16/2019  DualFemur Total Right 01/16/2019    67.5         (-2.1)     AP Spine L2-L4   01/16/2019  (-0.1) AP Spine  L2-L4       05/15/2008     (-1.0 )      DualFemur Neck Left 01/16/2019 (-1.9 ) DualFemur Neck Left   05/15/2008      (-1.6 )   10-year probability of major osteoporotic fracture: 10.9%  10-year probability of hip fracture: 1.7% -Patient is not a candidate for pharmacologic treatment Vitamin D: 84 (2022) -Current treatment  Vitamin D 5000 IU daily  (appropriate, effective, safe, accessible) Calcium Carbonate 675 daily (appropriate, effective, safe, accessible) - Rolaids -Medications previously tried: N/A  -Recommend [phone] units of vitamin D daily. Recommend 1200 mg of calcium daily from dietary and supplemental sources. Recommend weight-bearing and muscle strengthening exercises for building and maintaining bone density. -Counseled on diet and exercise extensively Recommended to continue current medication  Chronic Kidney Disease Stage 3a  -All medications assessed for renal dosing and appropriateness in chronic kidney disease. GFR: 57 06-14-2020 -Counseled on diet and exercise extensively Recommended avoiding NSAID and salt. Importance of BP control   GERD  -Controlled -Current treatment  Pantoprazole 40 mg daily (appropriate, effective, query safe) Pepcid 68m36mily (appropriate, effective, safe, accessible) -Medications previously tried: N/A  -Counseled on diet and exercise extensively Recommended to continue current medication Recommended alternating pantoprazole and pepcid (can take up to pepcid 40 mg BID)  Hypothyroid -Uncontrolled -Current treatment  Levothyroxine 125 mcg, 1/2 tablet M/Th, 1 tablet all other days (appropriate, effective, safe, accessible) TSH: 0.11 (12/23/2020), dose was reduced -Medications previously tried: N/A  -Recommended to continue current medication Discussed s/sx of thyroid imbalance  Migraines  -  Controlled -Current treatment  None -Medications previously tried: N/A  Patient does not report having migraines in the past -Recommended calling CPP if migraines occur   Health Maintenance -Vaccine gaps: COVID booster (discussed with patient)   Patient Goals/Self-Care Activities Patient will:  - take medications as prescribed  Follow Up Plan: Telephone follow up appointment with care management team member scheduled for: 04/29/21

## 2021-01-14 ENCOUNTER — Telehealth: Payer: Self-pay

## 2021-01-14 NOTE — Progress Notes (Signed)
   Name: [name]  MRN: [id number] DOB: [birth year]   RPM onboarding form for Weight scale complete and uploaded to share point folder.   Hildred Alamin, Health Concierge [phone]

## 2021-01-23 DIAGNOSIS — E039 Hypothyroidism, unspecified: Secondary | ICD-10-CM | POA: Diagnosis not present

## 2021-01-23 DIAGNOSIS — N183 Chronic kidney disease, stage 3 unspecified: Secondary | ICD-10-CM | POA: Diagnosis not present

## 2021-01-23 DIAGNOSIS — Z87440 Personal history of urinary (tract) infections: Secondary | ICD-10-CM | POA: Diagnosis not present

## 2021-01-23 DIAGNOSIS — E559 Vitamin D deficiency, unspecified: Secondary | ICD-10-CM | POA: Diagnosis not present

## 2021-01-23 DIAGNOSIS — I129 Hypertensive chronic kidney disease with stage 1 through stage 4 chronic kidney disease, or unspecified chronic kidney disease: Secondary | ICD-10-CM | POA: Diagnosis not present

## 2021-01-31 ENCOUNTER — Emergency Department (HOSPITAL_BASED_OUTPATIENT_CLINIC_OR_DEPARTMENT_OTHER): Payer: PPO

## 2021-01-31 ENCOUNTER — Other Ambulatory Visit: Payer: Self-pay

## 2021-01-31 ENCOUNTER — Emergency Department (HOSPITAL_BASED_OUTPATIENT_CLINIC_OR_DEPARTMENT_OTHER)
Admission: EM | Admit: 2021-01-31 | Discharge: 2021-01-31 | Disposition: A | Discharge status: Home / Self Care | Payer: PPO | Attending: Emergency Medicine | Admitting: Emergency Medicine

## 2021-01-31 ENCOUNTER — Emergency Department (HOSPITAL_BASED_OUTPATIENT_CLINIC_OR_DEPARTMENT_OTHER): Payer: PPO | Admitting: Radiology

## 2021-01-31 ENCOUNTER — Encounter (HOSPITAL_BASED_OUTPATIENT_CLINIC_OR_DEPARTMENT_OTHER): Payer: Self-pay | Admitting: *Deleted

## 2021-01-31 DIAGNOSIS — N3000 Acute cystitis without hematuria: Secondary | ICD-10-CM | POA: Insufficient documentation

## 2021-01-31 DIAGNOSIS — Z87891 Personal history of nicotine dependence: Secondary | ICD-10-CM | POA: Insufficient documentation

## 2021-01-31 DIAGNOSIS — R911 Solitary pulmonary nodule: Secondary | ICD-10-CM | POA: Diagnosis not present

## 2021-01-31 DIAGNOSIS — I5032 Chronic diastolic (congestive) heart failure: Secondary | ICD-10-CM | POA: Diagnosis not present

## 2021-01-31 DIAGNOSIS — R059 Cough, unspecified: Secondary | ICD-10-CM | POA: Diagnosis not present

## 2021-01-31 DIAGNOSIS — I130 Hypertensive heart and chronic kidney disease with heart failure and stage 1 through stage 4 chronic kidney disease, or unspecified chronic kidney disease: Secondary | ICD-10-CM | POA: Diagnosis not present

## 2021-01-31 DIAGNOSIS — R0602 Shortness of breath: Secondary | ICD-10-CM | POA: Diagnosis not present

## 2021-01-31 DIAGNOSIS — Z91040 Latex allergy status: Secondary | ICD-10-CM | POA: Diagnosis not present

## 2021-01-31 DIAGNOSIS — M791 Myalgia, unspecified site: Secondary | ICD-10-CM | POA: Insufficient documentation

## 2021-01-31 DIAGNOSIS — Z20822 Contact with and (suspected) exposure to covid-19: Secondary | ICD-10-CM | POA: Insufficient documentation

## 2021-01-31 DIAGNOSIS — N183 Chronic kidney disease, stage 3 unspecified: Secondary | ICD-10-CM | POA: Diagnosis not present

## 2021-01-31 DIAGNOSIS — R051 Acute cough: Secondary | ICD-10-CM

## 2021-01-31 DIAGNOSIS — E039 Hypothyroidism, unspecified: Secondary | ICD-10-CM | POA: Insufficient documentation

## 2021-01-31 DIAGNOSIS — J439 Emphysema, unspecified: Secondary | ICD-10-CM | POA: Diagnosis not present

## 2021-01-31 DIAGNOSIS — Z79899 Other long term (current) drug therapy: Secondary | ICD-10-CM | POA: Insufficient documentation

## 2021-01-31 DIAGNOSIS — Z7982 Long term (current) use of aspirin: Secondary | ICD-10-CM | POA: Insufficient documentation

## 2021-01-31 DIAGNOSIS — R531 Weakness: Secondary | ICD-10-CM

## 2021-01-31 DIAGNOSIS — G459 Transient cerebral ischemic attack, unspecified: Secondary | ICD-10-CM | POA: Diagnosis not present

## 2021-01-31 LAB — URINALYSIS, ROUTINE W REFLEX MICROSCOPIC
Bilirubin Urine: NEGATIVE
Glucose, UA: NEGATIVE mg/dL
Hgb urine dipstick: NEGATIVE
Ketones, ur: NEGATIVE mg/dL
Nitrite: NEGATIVE
Protein, ur: NEGATIVE mg/dL
Specific Gravity, Urine: 1.02 (ref 1.005–1.030)
pH: 6 (ref 5.0–8.0)

## 2021-01-31 LAB — COMPREHENSIVE METABOLIC PANEL
ALT: 9 U/L (ref 0–44)
AST: 12 U/L — ABNORMAL LOW (ref 15–41)
Albumin: 3.6 g/dL (ref 3.5–5.0)
Alkaline Phosphatase: 67 U/L (ref 38–126)
Anion gap: 10 (ref 5–15)
BUN: 24 mg/dL — ABNORMAL HIGH (ref 8–23)
CO2: 24 mmol/L (ref 22–32)
Calcium: 9.4 mg/dL (ref 8.9–10.3)
Chloride: 104 mmol/L (ref 98–111)
Creatinine, Ser: 1.03 mg/dL — ABNORMAL HIGH (ref 0.44–1.00)
GFR, Estimated: 59 mL/min — ABNORMAL LOW (ref >60)
Glucose, Bld: 152 mg/dL — ABNORMAL HIGH (ref 70–99)
Potassium: 3.6 mmol/L (ref 3.5–5.1)
Sodium: 138 mmol/L (ref 135–145)
Total Bilirubin: 0.5 mg/dL (ref 0.3–1.2)
Total Protein: 6.6 g/dL (ref 6.5–8.1)

## 2021-01-31 LAB — PROTIME-INR
INR: 1.1 (ref 0.8–1.2)
Prothrombin Time: 13.9 seconds (ref 11.4–15.2)

## 2021-01-31 LAB — CBC WITH DIFFERENTIAL/PLATELET
Abs Immature Granulocytes: 0.01 10*3/uL (ref 0.00–0.07)
Basophils Absolute: 0 10*3/uL (ref 0.0–0.1)
Basophils Relative: 0 %
Eosinophils Absolute: 0.3 10*3/uL (ref 0.0–0.5)
Eosinophils Relative: 5 %
HCT: 38.1 % (ref 36.0–46.0)
Hemoglobin: 12.8 g/dL (ref 12.0–15.0)
Immature Granulocytes: 0 %
Lymphocytes Relative: 18 %
Lymphs Abs: 1.2 10*3/uL (ref 0.7–4.0)
MCH: 30 pg (ref 26.0–34.0)
MCHC: 33.6 g/dL (ref 30.0–36.0)
MCV: 89.2 fL (ref 80.0–100.0)
Monocytes Absolute: 0.4 10*3/uL (ref 0.1–1.0)
Monocytes Relative: 7 %
Neutro Abs: 4.5 10*3/uL (ref 1.7–7.7)
Neutrophils Relative %: 70 %
Platelets: 256 10*3/uL (ref 150–400)
RBC: 4.27 MIL/uL (ref 3.87–5.11)
RDW: 13.5 % (ref 11.5–15.5)
WBC: 6.4 10*3/uL (ref 4.0–10.5)
nRBC: 0 % (ref 0.0–0.2)

## 2021-01-31 LAB — URINALYSIS, MICROSCOPIC (REFLEX)

## 2021-01-31 LAB — LACTIC ACID, PLASMA
Lactic Acid, Venous: 2 mmol/L (ref 0.5–1.9)
Lactic Acid, Venous: 0.8 mmol/L (ref 0.5–1.9)

## 2021-01-31 LAB — LIPASE, BLOOD: Lipase: 13 U/L (ref 11–51)

## 2021-01-31 LAB — RESP PANEL BY RT-PCR (FLU A&B, COVID) ARPGX2
Influenza A by PCR: NEGATIVE
Influenza B by PCR: NEGATIVE
SARS Coronavirus 2 by RT PCR: NEGATIVE

## 2021-01-31 LAB — APTT: aPTT: 41 seconds — ABNORMAL HIGH (ref 24–36)

## 2021-01-31 MED ORDER — LEVOFLOXACIN 500 MG PO TABS: 500.0000 mg | ORAL_TABLET | Freq: Every day | ORAL | 0 refills | Status: DC

## 2021-01-31 MED ORDER — IOHEXOL 350 MG/ML SOLN
100.0000 mL | Freq: Once | INTRAVENOUS | Status: AC | PRN
  Administered 2021-01-31: 61 mL via INTRAVENOUS

## 2021-01-31 MED ORDER — SODIUM CHLORIDE 0.9 % IV BOLUS
1000.0000 mL | Freq: Once | INTRAVENOUS | Status: AC
  Administered 2021-01-31: 1000 mL via INTRAVENOUS

## 2021-01-31 MED ORDER — LEVOFLOXACIN IN D5W 750 MG/150ML IV SOLN
750.0000 mg | Freq: Once | INTRAVENOUS | Status: AC
  Administered 2021-01-31: 750 mg via INTRAVENOUS
  Filled 2021-01-31: qty 150

## 2021-01-31 NOTE — ED Notes (Signed)
Pt ambulated to restroom wit no assistance.

## 2021-01-31 NOTE — Discharge Instructions (Addendum)
Take the antibiotics as prescribed  Follow up with primary care provider in the next few days for reevaluation  Make sure to drink plenty of fluids.  Rest.  Return for new or worsening symptoms

## 2021-01-31 NOTE — ED Notes (Signed)
Pt laying flat for orthostatics

## 2021-01-31 NOTE — ED Provider Notes (Signed)
[facility] EMERGENCY DEPT Provider Note   CSN: [id number] Arrival date & time: 01/31/21  1330    History Chief Complaint  Patient presents with   Weakness   Pain    [name] is a 70 y.o. female with past medical history significant for ascending aorta dilation, CHF, CKD, frequent UTI.  Presents for evaluation of myalgias and concerns for UTI. Has frequent UTI. Has been on 2 antibiotics this month. Was on Cipro for 1 week and most recently Bactrim x 2 days. Feeling generally unwell today. No fever, emesis, CP, SOB, abd pain, unilateral weakness. Did have some tingling to BL upper extremities. No facial droop, difficulty with word findings, syncope. Took 1 mg Xanax  and CBD gummy due to not feeling well. Has had cough, non productive, No fever, emesis, CP, SOB, back pain, diarrhea, rash, syncope.  Was told her urine grew out Ecoli this week after first round of Abx  Patient states allergy to Penicillin, cephalosporin, Macrobid, bactrim  Denies additional aggrieving or alleviating factors.  History obtained from patient, family in room and past medical records.  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   Ct chest 10/2019 3.8 cm   Cataract    CHF (congestive heart failure) (HCC)    to be evaluated   Chronic kidney disease (CKD), stage III (moderate) (HCC)    Closed displaced trimalleolar fracture of left ankle 08/30/2017   Depression    Diverticulosis    Emphysema lung (HCC)    Emphysema of lung (HCC)    no home oxygen   Encephalopathy, metabolic 7/40/8144   GERD (gastroesophageal reflux disease)    Helicobacter pylori (H. pylori)    Hyperlipidemia    Hypertension    IBS (irritable bowel  syndrome)    LBBB (left bundle branch block)    Lower urinary tract infection 06/21/2017   Migraines    Nodule of right lung 09/09/2020   Incidentally seen on CXR 08/2020 CT ordered -   IMPRESSION: 1. No acute chest abnormality. No suspicious nodularity in the right lung to account for the recent radiographic findings. 2. Diffuse emphysematous changes. 3. Aortic Atherosclerosis (ICD10-I70.0) and Emphysema (ICD10-J43.9).   OCD (obsessive compulsive disorder)    Osteoporosis    osteopenia   Prediabetes    pcp   Renal disorder    AKI due to lisinpril, his. pyelonephritis   Sepsis ([place])    Sleep apnea    no CPAP   Stroke [facility])    TIAs   Thyroid disease    TIA (transient ischemic attack)    Vitamin D deficiency     Patient Active Problem List   Diagnosis Date Noted   Incontinence of feces 12/23/2020   Interstitial cystitis 06/14/2020   Overweight (BMI 25.0-29.9) 06/13/2020   Former smoker (40 pack year, quit 2004) 08/30/2019   Atherosclerosis of aorta ([place]) 03/22/2019   Emphysema lung ([place])    Chronic diastolic CHF (congestive heart failure) ([place]) 03/11/2019   Osteopenia 01/16/2019   History of colon polyps 06/28/2018   CKD Stage 3 ([place]) 01/10/2015   IBS  01/10/2015   Hyperlipidemia, mixed 06/12/2014   Medication management 06/12/2014   LBBB (left bundle branch block) 07/24/2013  HTN (hypertension) 07/24/2013   Major depression, recurrent, full remission (HCC)    OCD (obsessive compulsive disorder)    Abnormal glucose    Migraines    Vitamin D deficiency    Hypothyroidism 04/05/2013    Past Surgical History:  Procedure Laterality Date   AUGMENTATION MAMMAPLASTY     BREAST ENHANCEMENT SURGERY     BREAST IMPLANT REMOVAL Bilateral 03/07/2019   BUNIONECTOMY     Bil   COLON SURGERY     colonoscopy   COLONOSCOPY  2014   ESOPHAGOGASTRODUODENOSCOPY (EGD) WITH PROPOFOL N/A 03/12/2019   Procedure: ESOPHAGOGASTRODUODENOSCOPY (EGD) WITH PROPOFOL;  Surgeon: Carol Ada, MD;   Location: [facility];  Service: Endoscopy;  Laterality: N/A;   HEMORRHOID SURGERY     OOPHORECTOMY     ORIF ANKLE FRACTURE Left 08/30/2017   Procedure: OPEN REDUCTION INTERNAL FIXATION (ORIF) ANKLE FRACTURE;  Surgeon: Rod Can, MD;  Location: [facility];  Service: Orthopedics;  Laterality: Left;   TONSILLECTOMY       OB History   No obstetric history on file.     Family History  Problem Relation Age of Onset   Colon polyps Mother    Muscular dystrophy Mother        late 85s   Muscular dystrophy Maternal Grandmother        42   Heart block Maternal Grandmother    Colon cancer Neg Hx    Esophageal cancer Neg Hx    Rectal cancer Neg Hx    Stomach cancer Neg Hx     Social History   Tobacco Use   Smoking status: Former    Packs/day: 1.00    Years: 40.00    Pack years: 40.00    Types: Cigarettes    Quit date: 04/27/2002    Years since quitting: 18.7   Smokeless tobacco: Never  Vaping Use   Vaping Use: Never used  Substance Use Topics   Alcohol use: No   Drug use: No    Home Medications Prior to Admission medications   Medication Sig Start Date End Date Taking? Authorizing Provider  levofloxacin (LEVAQUIN) 500 MG tablet Take 1 tablet (500 mg total) by mouth daily. 01/31/21  Yes Shaquil Aldana A, PA-C  albuterol (VENTOLIN HFA) 108 (90 Base) MCG/ACT inhaler Inhale 1-2 puffs into the lungs every 4 (four) hours as needed for wheezing or shortness of breath. 09/06/19   Liane Comber, NP  ALPRAZolam (XANAX) 0.5 MG tablet TAKE 1/2 TO 1 TABLET ONCE A DAY IF NEEDED FOR SEVERE ANXIETY OR SLEEP. **Use sparingly to prevent addiction and tolerance. 12/31/20   Liane Comber, NP  ascorbic acid (VITAMIN C) 250 MG tablet Take 500 mg by mouth daily.    [provider]  aspirin EC 81 MG tablet Take 81 mg by mouth daily.     [provider]  aspirin-acetaminophen-caffeine (EXCEDRIN MIGRAINE) [phone] MG tablet Take 1 tablet by mouth every 6 (six) hours as needed for  headache.    [provider]  atenolol (TENORMIN) 100 MG tablet Take 1 tablet daily  for blood pressure Patient taking differently: Take 1 tablet daily  for blood pressure 08/28/20   Unk [name], MD  Budeson-Glycopyrrol-Formoterol (BREZTRI AEROSPHERE) 160-9-4.8 MCG/ACT AERO Inhale into the lungs. Patient not taking: Reported on 12/31/2020    [provider]  buPROPion (WELLBUTRIN XL) 150 MG 24 hr tablet TAKE 3 TABLETS BY MOUTH EVERY MORNING FOR MOOD AND DEPRESSION 10/02/20 10/02/21  Liane Comber, NP  Ca Carbonate-Mag Hydroxide (ROLAIDS EXTRA  STRENGTH) 675-135 MG CHEW Chew 1 tablet by mouth daily.    [provider]  cetirizine (ZYRTEC) 10 MG tablet Take 10 mg by mouth daily.    [provider]  Cholecalciferol (VITAMIN D-3) 5000 UNITS TABS Take 5,000 Units by mouth daily.     [provider]  Cinnamon 500 MG capsule Take 500 mg by mouth daily.    [provider]  diphenhydrAMINE (BENADRYL) 25 MG tablet Take 25 mg by mouth every 6 (six) hours as needed.    [provider]  famotidine (PEPCID) 20 MG tablet TAKE 1 TABLET BY MOUTH TWICE DAILY FOR INDIGESTION AND HEARTBURN 08/27/20 08/27/21  Liane Comber, NP  guaifenesin (HUMIBID E) 400 MG TABS tablet Take 400 mg by mouth daily.    [provider]  hyoscyamine (LEVSIN SL) 0.125 MG SL tablet DISSOLVE ONE TABLET UNDER THE TONGUE EVERY 4 HOURS IF NEEDED FOR NAUSEA, BLOATING, CRAMPING, OR DIARRHEA 12/23/20   Magda Bernheim, NP  ipratropium-albuterol (DUONEB) 0.5-2.5 (3) MG/3ML SOLN USE 1 VIAL IN NEBULIZER EVERY 4 HOURS AS NEEDED **MAX 6 DOSES A DAY** 11/24/19   Liane Comber, NP  levothyroxine (SYNTHROID) 125 MCG tablet Take 1/2 tab on Mon/Thurs and 1 tablet  by mouth all other days on an empty stomach with only water for 30 minutes & no antacid meds, calcium or magnesium for 4 hours & avoid biotin 12/24/20   Liane Comber, NP  Magnesium 250 MG TABS Take 250 mg by mouth daily.    [provider]  ondansetron (ZOFRAN) 8 MG tablet TAKE 1 TABLET BY MOUTH 3 TIMES A DAY AS NEEDED FOR NAUSEA 10/02/20   Liane Comber, NP  pantoprazole (PROTONIX) 40 MG tablet Take 1 tablet (40 mg total) by mouth daily. 11/20/20   Liane Comber, NP  promethazine-dextromethorphan (PROMETHAZINE-DM) 6.25-15 MG/5ML syrup Take 5 mLs by mouth 4 (four) times daily as needed for cough. 09/06/20   Liane Comber, NP  sertraline (ZOLOFT) 100 MG tablet Take 1 tablet by mouth daily for mood 11/19/20   Unk [name], MD  Turmeric 500 MG CAPS Take 1 tablet by mouth daily.    [provider]  vitamin B-12 (CYANOCOBALAMIN) 1000 MCG tablet Take 1,000 mcg by mouth. Every 4 days    [provider]  vitamin E 1000 UNIT capsule Take 1,000 Units by mouth daily.    [provider]    Allergies    Ace inhibitors, Cephalexin, Dilaudid [hydromorphone hcl], Robaxin [methocarbamol], Macrobid [nitrofurantoin], Neurontin [gabapentin], Codeine, Latex, and Sulfa antibiotics  Review of Systems   Review of Systems  Constitutional:  Positive for activity change, appetite change and fatigue.  HENT: Negative.    Respiratory:  Positive for cough.   Cardiovascular: Negative.   Gastrointestinal: Negative.   Genitourinary:  Positive for dysuria and urgency. Negative for decreased urine volume, difficulty urinating, dyspareunia, enuresis, flank pain, frequency, hematuria, menstrual problem, pelvic pain, vaginal bleeding and vaginal pain.  Musculoskeletal:  Positive for myalgias. Negative for arthralgias, back pain, joint swelling, neck pain and neck stiffness.  Skin: Negative.   Neurological:  Positive for weakness (Generalized) and light-headedness (when sitting to standing). Negative for dizziness, tremors, seizures, syncope and numbness.  All other systems reviewed and are negative.  Physical Exam Updated Vital Signs BP 107/74   Pulse 74   Temp 99.5 F (37.5 C) (Oral)   Resp 18   Ht 5\' 3"  (1.6  m)   Wt 78.2 kg   SpO2 94%   BMI  30.56 kg/m   Physical Exam Vitals and nursing note reviewed.  Constitutional:      General: She is not in acute distress.    Appearance: She is well-developed. She is not ill-appearing, toxic-appearing or diaphoretic.  HENT:     Head: Normocephalic and atraumatic.     Nose: Nose normal.     Mouth/Throat:     Mouth: Mucous membranes are dry.  Eyes:     Pupils: Pupils are equal, round, and reactive to light.  Cardiovascular:     Rate and Rhythm: Normal rate.     Pulses: Normal pulses.          Radial pulses are 2+ on the right side and 2+ on the left side.       Dorsalis pedis pulses are 2+ on the right side and 2+ on the left side.     Heart sounds: Normal heart sounds.  Pulmonary:     Effort: Pulmonary effort is normal. No respiratory distress.     Breath sounds: Normal breath sounds and air entry.     Comments: Clear Bl, Speaks in full sentences without difficulty Chest:     Comments: Non tender Abdominal:     General: Bowel sounds are normal. There is no distension.     Palpations: Abdomen is soft.     Tenderness: There is no abdominal tenderness. There is no right CVA tenderness, left CVA tenderness, guarding or rebound.     Comments: Soft non tender without rebound or guarding  Musculoskeletal:        General: No swelling, tenderness, deformity or signs of injury. Normal range of motion.     Cervical back: Normal range of motion.     Right lower leg: No edema.     Left lower leg: No edema.     Comments: Moves all 4 extremities without difficulty . No bony tenderness. Compartments soft  Skin:    General: Skin is warm and dry.     Capillary Refill: Capillary refill takes less than 2 seconds.     Comments: No rash or lesion  Neurological:     General: No focal deficit present.     Mental Status: She is alert.     Comments: CN 2-12 intact Equal strength Intact sensation No arm drift Ambulatory without ataxic gait  Psychiatric:         Mood and Affect: Mood normal.   ED Results / Procedures / Treatments   Labs (all labs ordered are listed, but only abnormal results are displayed) Labs Reviewed  COMPREHENSIVE METABOLIC PANEL - Abnormal; Notable for the following components:      Result Value   Glucose, Bld 152 (*)    BUN 24 (*)    Creatinine, Ser 1.03 (*)    AST 12 (*)    GFR, Estimated 59 (*)    All other components within normal limits  LACTIC ACID, PLASMA - Abnormal; Notable for the following components:   Lactic Acid, Venous 2.0 (*)    All other components within normal limits  URINALYSIS, ROUTINE W REFLEX MICROSCOPIC - Abnormal; Notable for the following components:   Color, Urine STRAW (*)    Leukocytes,Ua SMALL (*)    All other components within normal limits  APTT - Abnormal; Notable for the following components:   aPTT 41 (*)    All other components within normal limits  URINALYSIS, MICROSCOPIC (REFLEX) - Abnormal; Notable for the following components:   Bacteria, UA RARE (*)  All other components within normal limits  RESP PANEL BY RT-PCR (FLU A&B, COVID) ARPGX2  CULTURE, BLOOD (ROUTINE X 2)  CULTURE, BLOOD (ROUTINE X 2)  URINE CULTURE  LACTIC ACID, PLASMA  CBC WITH DIFFERENTIAL/PLATELET  PROTIME-INR  LIPASE, BLOOD    EKG None  Radiology DG Chest 2 View  Result Date: 01/31/2021 CLINICAL DATA:  Suspected sepsis. EXAM: CHEST - 2 VIEW COMPARISON:  Chest CT 09/17/2020. Prior chest radiographs 09/06/2020 and earlier. FINDINGS: Heart size within normal limits. Aortic atherosclerosis. No appreciable airspace consolidation. Redemonstrated emphysema and chronic prominence of the interstitial lung markings. Nodular opacity projecting in the region of the right lung apex measuring at least 7 mm, new from the prior chest radiographs of 09/06/2020. Right apical pleuroparenchymal scarring. No acute bony abnormality identified. S-shaped curvature of the thoracic spine. Degenerative changes of the spine.  IMPRESSION: Nodular opacity projecting in the region of the right lung apex, measuring at least 7 mm, new as compared to the chest radiographs of 09/06/2020. A chest CT is recommended to better assess for a possible pulmonary nodule at this site. No appreciable airspace consolidation elsewhere. Emphysema and chronic prominence of the interstitial lung markings. Aortic Atherosclerosis (ICD10-I70.0). Electronically Signed   By: Kellie Simmering D.O.   On: 01/31/2021 15:01   CT Head Wo Contrast  Result Date: 01/31/2021 CLINICAL DATA:  TIA. EXAM: CT HEAD WITHOUT CONTRAST TECHNIQUE: Contiguous axial images were obtained from the base of the skull through the vertex without intravenous contrast. COMPARISON:  None. FINDINGS: Brain: No acute infarct, hemorrhage, or mass lesion is present. No significant white matter lesions are present. The ventricles are of normal size. No significant extraaxial fluid collection is present. The brainstem and cerebellum are within normal limits. Vascular: Atherosclerotic calcifications are present within the cavernous internal carotid arteries bilaterally. Additional calcifications are present within the dural margin of both vertebral arteries. No hyperdense vessel is present. Skull: Calvarium is intact. No focal lytic or blastic lesions are present. No significant extracranial soft tissue lesion is present. Sinuses/Orbits: The paranasal sinuses and mastoid air cells are clear. Bilateral lens replacements are noted. Globes and orbits are otherwise unremarkable. IMPRESSION: 1. Normal CT appearance of the brain. 2. Atherosclerosis. Electronically Signed   By: San Morelle M.D.   On: 01/31/2021 14:51   CT Angio Chest PE W and/or Wo Contrast  Result Date: 01/31/2021 CLINICAL DATA:  Shortness of breath, history of CHF EXAM: CT ANGIOGRAPHY CHEST WITH CONTRAST TECHNIQUE: Multidetector CT imaging of the chest was performed using the standard protocol during bolus administration of  intravenous contrast. Multiplanar CT image reconstructions and MIPs were obtained to evaluate the vascular anatomy. CONTRAST:  27mL OMNIPAQUE IOHEXOL 350 MG/ML SOLN COMPARISON:  09/17/2020 FINDINGS: Cardiovascular: Satisfactory opacification of the pulmonary arteries to the segmental level. No evidence of pulmonary embolism. Normal heart size. No pericardial effusion. Thoracic aortic atherosclerosis. Coronary artery atherosclerosis. Mediastinum/Nodes: No enlarged mediastinal, hilar, or axillary lymph nodes. Thyroid gland, trachea, and esophagus demonstrate no significant findings. Lungs/Pleura: Centrilobular and paraseptal emphysema. Two stable left lower lobe pulmonary nodules. No focal consolidation, pleural effusion or pneumothorax. Upper Abdomen: No acute upper abdominal abnormality. Musculoskeletal: No acute osseous abnormality. No aggressive osseous lesion. Diffuse thoracic spine and cervical spine spondylosis. Chronic L1 vertebral body compression fracture. Review of the MIP images confirms the above findings. IMPRESSION: 1. No evidence of pulmonary embolus. 2. Aortic Atherosclerosis (ICD10-I70.0) and Emphysema (ICD10-J43.9). Electronically Signed   By: Kathreen Devoid M.D.   On: 01/31/2021 17:13  Procedures Procedures   Medications Ordered in ED Medications  sodium chloride 0.9 % bolus 1,000 mL (0 mLs Intravenous Stopped 01/31/21 1743)  levofloxacin (LEVAQUIN) IVPB 750 mg (0 mg Intravenous Stopped 01/31/21 1742)  iohexol (OMNIPAQUE) 350 MG/ML injection 100 mL (61 mLs Intravenous Contrast Given 01/31/21 1641)  sodium chloride 0.9 % bolus 1,000 mL (1,000 mLs Intravenous New Bag/Given 01/31/21 1815)   ED Course  I have reviewed the triage vital signs and the nursing notes.  Pertinent labs & imaging results that were available during my care of the patient were reviewed by me and considered in my medical decision making (see chart for details).  Here for evaluation of feeling unwell. On arrival  afebrile, non septic, non ill appearing. Soft BP, appears baseline is 867 systolic. Hx of CHF however does not appear fluid overloaded. Heart and lungs clear. Abd soft non tender. Non focal Neuro exam without deficits. Plan on labs imaging and reassessed.  Labs and imaging personally reviewed and interpreted:  COVID, Flu neg Lipase 13 UA with bacteria, leuks, Reviewed prior UA culture report and with multiple med allergies., Will given Levaquin Lipase 2.0>>0.8 CBC without leukocytosis CMP with labs similar to baseline DG chest with possibles mass, Rec ct scan CTA without PE, infection, mass CT head without acute findings EKG not crossing over  into Epic, No ischemic changes  Patient reassessed. Some soft BP. Will give IVF bolus. At this time low suspicion for sepsis.   Reassessed. BP improved with IVF. Shared decision making with patient for admission for failed outpatient Abx vs change in abx and FU outpatient with PCP. Patient would like discharge and FU outpatient. Neg orthostatic VS. Tolerating PO intake without difficulty. Ambulatory without difficulty.  Initially had some weakness however no focal changes on exam, reassuring head CT. Low suspicion for acute central intracranial process.  Will dc home with close outpatient FU. Return for new or worsening symptoms  The patient has been appropriately medically screened and/or stabilized in the ED. I have low suspicion for any other emergent medical condition which would require further screening, evaluation or treatment in the ED or require inpatient management.  Patient is hemodynamically stable and in no acute distress.  Patient able to ambulate in department prior to ED.  Evaluation does not show acute pathology that would require ongoing or additional emergent interventions while in the emergency department or further inpatient treatment.  I have discussed the diagnosis with the patient and answered all questions.  Pain is been managed  while in the emergency department and patient has no further complaints prior to discharge.  Patient is comfortable with plan discussed in room and is stable for discharge at this time.  I have discussed strict return precautions for returning to the emergency department.  Patient was encouraged to follow-up with PCP/specialist refer to at discharge.      MDM Rules/Calculators/A&P                           LATRICE STORLIE was evaluated in Emergency Department on 01/31/2021 for the symptoms described in the history of present illness. She was evaluated in the context of the global COVID-19 pandemic, which necessitated consideration that the patient might be at risk for infection with the SARS-CoV-2 virus that causes COVID-19. Institutional protocols and algorithms that pertain to the evaluation of patients at risk for COVID-19 are in a state of rapid change based on information released by regulatory bodies including  the [employer] and federal and state organizations. These policies and algorithms were followed during the patient's care in the ED.  Final Clinical Impression(s) / ED Diagnoses Final diagnoses:  Weakness  Acute cystitis without hematuria  Acute cough    Rx / DC Orders ED Discharge Orders          Ordered    levofloxacin (LEVAQUIN) 500 MG tablet  Daily        01/31/21 2112             Tavish Gettis A, PA-C 01/31/21 2117    Elnora Morrison, MD 02/01/21 2353

## 2021-01-31 NOTE — ED Triage Notes (Addendum)
Yesterday pt developed pain in her hands, arms then generally all over, no fever. This is the way the UTI/Sepsis started in 2017. Pt with urinary frequency voiding only small amts. Pt took 2 Xanax 0.5 mg tabs about 5 am since she could not sleep. About 30 min ago took CBD gummy. Pt also started taking medicine yesterday for E Coli, then pain started.

## 2021-01-31 NOTE — ED Notes (Signed)
Patient transported to X-ray 

## 2021-01-31 NOTE — ED Notes (Signed)
Notified PA of conversation with Pt related to  Pt concerns. Pa was already aware

## 2021-02-01 LAB — URINE CULTURE

## 2021-02-05 LAB — CULTURE, BLOOD (ROUTINE X 2)
Culture: NO GROWTH
Culture: NO GROWTH
Special Requests: ADEQUATE
Special Requests: ADEQUATE

## 2021-02-05 NOTE — Progress Notes (Signed)
Assessment and Plan:  Reis was seen today for follow-up.  Diagnoses and all orders for this visit:  Dysuria -     Urinalysis, Routine w reflex microscopic -     Urine Culture  Complete course of Levaquin and push fluids  Myalgia -     Lactic acid, plasma -     CK - CMP   Encouraged Magnesium 400mg  daily  Encourage use of Omega 3 for joints  Continue diet and exercise    Further disposition pending results of labs. Discussed med's effects and SE's.   Over 30 minutes of exam, counseling, chart review, and critical decision making was performed.   Future Appointments  Date Time Provider [place]  04/29/2021 11:00 AM Rush Landmark, [facility] None  06/13/2021 10:00 AM Liane Comber, NP [facility] None  12/23/2021 10:30 AM Liane Comber, NP [facility] None    ------------------------------------------------------------------------------------------------------------------   HPI BP 110/60   Pulse 71   Temp 97.7 F (36.5 C)   SpO2 97%  70 y.o.female presents for post ER visit was given Levaquin for UTI. Had elevated lactic acid with first draw of 2 but when repeated was 0.8 which is WNL  Pt was seen in ER 01/31/21:Pertinent labs & imaging results that were available during my care of the patient were reviewed by me and considered in my medical decision making (see chart for details).   Here for evaluation of feeling unwell. On arrival afebrile, non septic, non ill appearing. Soft BP, appears baseline is 161 systolic. Hx of CHF however does not appear fluid overloaded. Heart and lungs clear. Abd soft non tender. Non focal Neuro exam without deficits. Plan on labs imaging and reassessed.   Labs and imaging personally reviewed and interpreted:   COVID, Flu neg Lipase 13 UA with bacteria, leuks, Reviewed prior UA culture report and with multiple med allergies., Will given Levaquin Lipase 2.0>>0.8 CBC without leukocytosis CMP with labs similar to baseline DG  chest with possibles mass, Rec ct scan CTA without PE, infection, mass CT head without acute findings EKG not crossing over  into Epic, No ischemic changes   Patient reassessed. Some soft BP. Will give IVF bolus. At this time low suspicion for sepsis.    Reassessed. BP improved with IVF. Shared decision making with patient for admission for failed outpatient Abx vs change in abx and FU outpatient with PCP. Patient would like discharge and FU outpatient. Neg orthostatic VS. Tolerating PO intake without difficulty. Ambulatory without difficulty.  Initially had some weakness however no focal changes on exam, reassuring head CT. Low suspicion for acute central intracranial process.   Will dc home with close outpatient FU. Return for new or worsening symptoms  Pt does follow with [facility] and they initially treated pt for UTI with Bactrim, then pt had severe fatigue and myalgias and went to ER and was switched to Levaquin. She is on her last day of Levaquin She states urinary symptoms have improved.  Muscle aching/cramps have improved but have not completely resolved.  Pt is also concerned because she states glucose was elevated when in the ER and she never has an elevated glucose unless on steroids or has an infection.  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   Ct chest 10/2019 3.8 cm   Cataract    CHF (congestive heart failure) (HCC)    to be evaluated   Chronic kidney disease (CKD), stage III (moderate) (HCC)    Closed displaced trimalleolar fracture of left ankle 08/30/2017   Depression    Diverticulosis    Emphysema lung (HCC)    Emphysema of lung (HCC)    no home oxygen   Encephalopathy,  metabolic 8/75/6433   GERD (gastroesophageal reflux disease)    Helicobacter pylori (H. pylori)    Hyperlipidemia    Hypertension    IBS (irritable bowel syndrome)    LBBB (left bundle branch block)    Lower urinary tract infection 06/21/2017   Migraines    Nodule of right lung 09/09/2020   Incidentally seen on CXR 08/2020 CT ordered -   IMPRESSION: 1. No acute chest abnormality. No suspicious nodularity in the right lung to account for the recent radiographic findings. 2. Diffuse emphysematous changes. 3. Aortic Atherosclerosis (ICD10-I70.0) and Emphysema (ICD10-J43.9).   OCD (obsessive compulsive disorder)    Osteoporosis    osteopenia   Prediabetes    pcp   Renal disorder    AKI due to lisinpril, his. pyelonephritis   Sepsis ([place])    Sleep apnea    no CPAP   Stroke ([place])    TIAs   Thyroid disease    TIA (transient ischemic attack)    Vitamin D deficiency      Allergies  Allergen Reactions   Ace Inhibitors Other (See Comments)    Acute renal failure (lisinopril)   Cephalexin Anaphylaxis   Dilaudid [Hydromorphone Hcl] Anaphylaxis   Robaxin [Methocarbamol] Anaphylaxis   Macrobid [Nitrofurantoin] Other (See Comments)    Causes hyperthermia    Neurontin [Gabapentin]     Confusion    Codeine Nausea Only   Latex Rash   Sulfa Antibiotics Other (See Comments)    Do not take per nephrology    Current Outpatient Medications on File Prior to Visit  Medication Sig   albuterol (VENTOLIN HFA) 108 (90 Base) MCG/ACT inhaler Inhale 1-2 puffs into the lungs every 4 (four) hours as needed for wheezing or shortness of breath.   ALPRAZolam (XANAX) 0.5 MG tablet TAKE 1/2 TO 1 TABLET ONCE A DAY IF NEEDED FOR SEVERE ANXIETY OR SLEEP. **Use sparingly to prevent addiction and tolerance.   ascorbic acid (VITAMIN C) 250 MG tablet Take 500 mg by mouth daily.   aspirin EC 81 MG tablet Take 81 mg by mouth daily.    aspirin-acetaminophen-caffeine (EXCEDRIN MIGRAINE) 250-250-65 MG tablet Take 1  tablet by mouth every 6 (six) hours as needed for headache.   atenolol (TENORMIN) 100 MG tablet Take 1 tablet daily  for blood pressure (Patient taking differently: Take 1/2 tablet daily  for blood pressure)   buPROPion (WELLBUTRIN XL) 150 MG 24 hr tablet TAKE 3 TABLETS BY MOUTH EVERY MORNING FOR MOOD AND DEPRESSION   Ca Carbonate-Mag Hydroxide (ROLAIDS EXTRA STRENGTH) 675-135 MG CHEW Chew 1 tablet by mouth daily.   cetirizine (ZYRTEC) 10 MG tablet Take 10 mg by mouth daily.   Cholecalciferol (VITAMIN D-3) 5000 UNITS TABS Take 5,000 Units by mouth daily.    Cinnamon 500 MG capsule Take 500 mg by mouth daily.   diphenhydrAMINE (BENADRYL) 25 MG tablet Take 25 mg by mouth every 6 (six) hours as needed.   famotidine (PEPCID) 20 MG tablet TAKE 1 TABLET BY MOUTH TWICE DAILY FOR INDIGESTION  AND HEARTBURN   guaifenesin (HUMIBID E) 400 MG TABS tablet Take 400 mg by mouth daily.   hyoscyamine (LEVSIN SL) 0.125 MG SL tablet DISSOLVE ONE TABLET UNDER THE TONGUE EVERY 4 HOURS IF NEEDED FOR NAUSEA, BLOATING, CRAMPING, OR DIARRHEA   ipratropium-albuterol (DUONEB) 0.5-2.5 (3) MG/3ML SOLN USE 1 VIAL IN NEBULIZER EVERY 4 HOURS AS NEEDED **MAX 6 DOSES A DAY**   levofloxacin (LEVAQUIN) 500 MG tablet Take 1 tablet (500 mg total) by mouth daily.   levothyroxine (SYNTHROID) 125 MCG tablet Take 1/2 tab on Mon/Thurs and 1 tablet  by mouth all other days on an empty stomach with only water for 30 minutes & no antacid meds, calcium or magnesium for 4 hours & avoid biotin   Magnesium 250 MG TABS Take 250 mg by mouth daily.   ondansetron (ZOFRAN) 8 MG tablet TAKE 1 TABLET BY MOUTH 3 TIMES A DAY AS NEEDED FOR NAUSEA   pantoprazole (PROTONIX) 40 MG tablet Take 1 tablet (40 mg total) by mouth daily.   sertraline (ZOLOFT) 100 MG tablet Take 1 tablet by mouth daily for mood   Turmeric 500 MG CAPS Take 1 tablet by mouth daily.   vitamin E 1000 UNIT capsule Take 1,000 Units by mouth daily.   Budeson-Glycopyrrol-Formoterol (BREZTRI  AEROSPHERE) 160-9-4.8 MCG/ACT AERO Inhale into the lungs. (Patient not taking: Reported on 12/31/2020)   promethazine-dextromethorphan (PROMETHAZINE-DM) 6.25-15 MG/5ML syrup Take 5 mLs by mouth 4 (four) times daily as needed for cough. (Patient not taking: Reported on 02/06/2021)   vitamin B-12 (CYANOCOBALAMIN) 1000 MCG tablet Take 1,000 mcg by mouth. Every 4 days (Patient not taking: Reported on 02/06/2021)   No current facility-administered medications on file prior to visit.    ROS: all negative except above.   Physical Exam:  BP 110/60   Pulse 71   Temp 97.7 F (36.5 C)   SpO2 97%   General Appearance: Well nourished, in no apparent distress. Eyes: PERRLA, EOMs, conjunctiva no swelling or erythema Sinuses: No Frontal/maxillary tenderness ENT/Mouth: Ext aud canals clear, TMs without erythema, bulging. No erythema, swelling, or exudate on post pharynx.  Tonsils not swollen or erythematous. Hearing normal.  Neck: Supple, thyroid normal.  Respiratory: Respiratory effort normal, BS equal bilaterally without rales, rhonchi, wheezing or stridor.  Cardio: RRR with no MRGs. Brisk peripheral pulses without edema.  Abdomen: Soft, + BS.  Non tender, no guarding, rebound, hernias, masses. Lymphatics: Non tender without lymphadenopathy.  Musculoskeletal: Full ROM, 5/5 strength, normal gait. Negative CVA tenderness Skin: Warm, dry without rashes, lesions, ecchymosis.  Neuro: Cranial nerves intact. Normal muscle tone, no cerebellar symptoms. Sensation intact.  Psych: Awake and oriented X 3, normal affect, Insight and Judgment appropriate.     Magda Bernheim, NP 9:34 AM [facility]

## 2021-02-06 ENCOUNTER — Other Ambulatory Visit: Payer: Self-pay

## 2021-02-06 ENCOUNTER — Ambulatory Visit (INDEPENDENT_AMBULATORY_CARE_PROVIDER_SITE_OTHER): Payer: PPO | Admitting: Nurse Practitioner

## 2021-02-06 ENCOUNTER — Encounter: Payer: Self-pay | Admitting: Nurse Practitioner

## 2021-02-06 VITALS — BP 110/60 | HR 71 | Temp 97.7°F | Wt 164.8 lb

## 2021-02-06 DIAGNOSIS — M791 Myalgia, unspecified site: Secondary | ICD-10-CM

## 2021-02-06 DIAGNOSIS — R300 Dysuria: Secondary | ICD-10-CM

## 2021-02-06 NOTE — Patient Instructions (Addendum)
Try to get 400 mg of Magnesium a day  Muscle Pain, Adult Muscle pain, also called myalgia, is a condition in which a person has pain in one or more muscles in the body. Muscle pain may be mild, moderate, or severe. It may feel sharp, achy, or burning. In most cases, the pain lasts only a short time and goes away without treatment. Muscle pain can result from using muscles in a new or different way or after a period of inactivity. It is normal to feel some muscle pain after starting an exercise program. Muscles that have not been used often will be sore at first. What are the causes? This condition is caused by using muscles in a new or different way after a period of inactivity. Other causes may include: Overuse or muscle strain, especially if you are not in shape. This is the most common cause of muscle pain. Injury or bruising. Infectious diseases, including diseases caused by viruses, such as the flu (influenza). Fibromyalgia.This is a long-term, or chronic, condition that causes muscle tenderness, tiredness (fatigue), and headache. Autoimmune or rheumatologic diseases. These are conditions, such as lupus, in which the body's defense system (immunesystem) attacks areas in the body. Certain medicines, including ACE inhibitors and statins. What are the signs or symptoms? The main symptom of this condition is sore or painful muscles, including during activity and when stretching. You may also have slight swelling. How is this diagnosed? This condition is diagnosed with a physical exam. Your health care provider will ask questions about your pain and when it began. If you have not had muscle pain for very long, your health care provider may want to wait before doing much testing. If your muscle pain has lasted a long time, tests may be done right away. In some cases, this may include tests to rule out certain conditions or illnesses. How is this treated? Treatment for this condition depends on the  cause. Home care is often enough to relieve muscle pain. Your health care provider may also prescribe NSAIDs, such as ibuprofen. Follow these instructions at home: Medicines Take over-the-counter and prescription medicines only as told by your health care provider. Ask your health care provider if the medicine prescribed to you requires you to avoid driving or using machinery. Managing pain, swelling, and discomfort   If directed, put ice on the painful area. To do this: Put ice in a plastic bag. Place a towel between your skin and the bag. Leave the ice on for 20 minutes, 2-3 times a day. For the first 2 days of muscle soreness, or if there is swelling: Do not soak in hot baths. Do not use a hot tub, steam room, sauna, heating pad, or other heat source. After 48-72 hours, you may alternate between applying ice and applying heat as told by your health care provider. If directed, apply heat to the affected area as often as told by your health care provider. Use the heat source that your health care provider recommends, such as a moist heat pack or a heating pad. Place a towel between your skin and the heat source. Leave the heat on for 20-30 minutes. Remove the heat if your skin turns bright red. This is especially important if you are unable to feel pain, heat, or cold. You may have a greater risk of getting burned. If you have an injury, raise (elevate) the injured area above the level of your heart while you are sitting or lying down. Activity  If  overuse is causing your muscle pain: Slow down your activities until the pain goes away. Do regular, gentle exercises if you are not usually active. Warm up before exercising. Stretch before and after exercising. This can help lower the risk of muscle pain. Do not continue working out if the pain is severe. Severe pain could mean that you have injured a muscle. Do not lift anything that is heavier than 5-10 lb (2.3-4.5 kg), or the limit that you  are told, until your health care provider says that it is safe. Return to your normal activities as told by your health care provider. Ask your health care provider what activities are safe for you. General instructions Do not use any products that contain nicotine or tobacco, such as cigarettes, e-cigarettes, and chewing tobacco. These can delay healing. If you need help quitting, ask your health care provider. Keep all follow-up visits as told by your health care provider. This is important. Contact a health care provider if you have: Muscle pain that gets worse and medicines do not help. Muscle pain that lasts longer than 3 days. A rash or fever along with muscle pain. Muscle pain after a tick bite. Muscle pain while working out, even though you are in good physical condition. Redness, soreness, or swelling along with muscle pain. Muscle pain after starting a new medicine or changing the dose of a medicine. Get help right away if you have: Trouble breathing. Trouble swallowing. Muscle pain along with a stiff neck, fever, and vomiting. Severe muscle weakness or you cannot move part of your body. These symptoms may represent a serious problem that is an emergency. Do not wait to see if the symptoms will go away. Get medical help right away. Call your local emergency services (911 in the [place] Do not drive yourself to the hospital. Summary Muscle pain usually lasts only a short time and goes away without treatment. This condition is caused by using muscles in a new or different way after a period of inactivity. If your muscle pain lasts longer than 3 days, tell your health care provider. This information is not intended to replace advice given to you by your health care provider. Make sure you discuss any questions you have with your health care provider. Document Revised: 01/20/2019 Document Reviewed: 01/20/2019 Elsevier Patient Education  2022 [employer].

## 2021-02-07 LAB — COMPLETE METABOLIC PANEL WITH GFR
AG Ratio: 1.3 (calc) (ref 1.0–2.5)
ALT: 9 U/L (ref 6–29)
AST: 14 U/L (ref 10–35)
Albumin: 3.7 g/dL (ref 3.6–5.1)
Alkaline phosphatase (APISO): 73 U/L (ref 37–153)
BUN/Creatinine Ratio: 19 (calc) (ref 6–22)
BUN: 24 mg/dL (ref 7–25)
CO2: 29 mmol/L (ref 20–32)
Calcium: 9.4 mg/dL (ref 8.6–10.4)
Chloride: 101 mmol/L (ref 98–110)
Creat: 1.28 mg/dL — ABNORMAL HIGH (ref 0.50–1.05)
Globulin: 2.9 g/dL (calc) (ref 1.9–3.7)
Glucose, Bld: 72 mg/dL (ref 65–99)
Potassium: 4.8 mmol/L (ref 3.5–5.3)
Sodium: 137 mmol/L (ref 135–146)
Total Bilirubin: 0.5 mg/dL (ref 0.2–1.2)
Total Protein: 6.6 g/dL (ref 6.1–8.1)
eGFR: 45 mL/min/{1.73_m2} — ABNORMAL LOW (ref >=60)

## 2021-02-07 LAB — URINE CULTURE
MICRO NUMBER:: 12499600
Result:: NO GROWTH
SPECIMEN QUALITY:: ADEQUATE

## 2021-02-07 LAB — URINALYSIS, ROUTINE W REFLEX MICROSCOPIC
Bacteria, UA: NONE SEEN /HPF
Bilirubin Urine: NEGATIVE
Glucose, UA: NEGATIVE
Hgb urine dipstick: NEGATIVE
Hyaline Cast: NONE SEEN /LPF
Ketones, ur: NEGATIVE
Nitrite: NEGATIVE
Protein, ur: NEGATIVE
RBC / HPF: NONE SEEN /HPF (ref 0–2)
Specific Gravity, Urine: 1.011 (ref 1.001–1.035)
Squamous Epithelial / HPF: NONE SEEN /HPF (ref <=5)
pH: 6 (ref 5.0–8.0)

## 2021-02-07 LAB — CK: Total CK: 60 U/L (ref 29–143)

## 2021-02-07 LAB — MICROSCOPIC MESSAGE

## 2021-02-10 ENCOUNTER — Other Ambulatory Visit (HOSPITAL_COMMUNITY): Payer: Self-pay

## 2021-02-11 ENCOUNTER — Other Ambulatory Visit: Payer: Self-pay | Admitting: Adult Health

## 2021-02-11 MED ORDER — LEVOTHYROXINE SODIUM 125 MCG PO TABS: ORAL_TABLET | ORAL | 0 refills | Status: DC

## 2021-02-12 ENCOUNTER — Telehealth: Payer: Self-pay

## 2021-02-12 NOTE — Progress Notes (Signed)
[name], [name]   17 years, Female  DOB: 11-14-1950  M: [phone]    Section 1: Review Chart  1. What recent interventions/DTPs have been made by any provider to improve the patient's conditions in the last 3 months?  02/06/2021- Lauren Lamas, NP- Patient presented for dysuria. BP 110/60, Pulse 71. No medication changes.  2. Any recent hospitalizations or ED visits since last visit with CPP? Yes  2.a Brief Summary (including Medication and/or Diagnosis changes):  01/31/2021- [place] [facility] for weakness and pain. BP 107/74, Pulse 74. S tarted Levofloxacin 500mg  daily for 7 days. Section 2: Adherence Review  1. Adherence rates for STAR metric medications (medication / day supply / last 2 fill dates) None  2. Adherence rates for medications indicated for disease state being reviewed (medication / day supply / last 2 fill dates) None  3. Does the patient have >5 day gap between last estimated fill dates for any of the above medications? We review for adherence No  Section 3: General Disease State Questions 1. Able to connect with the Patient? Yes  1.a Did patient have any problems with their health recently? No  1.b Did patient have any problems with their pharmacy? No  1.c Does patient have any issues or side effects with their medications? No  1.d Additional information to pass to Patient's CPP? No  1.e Anything we can do to help take better care of Patient? No  Section 4: Pharmacist's Review  1. Adherence gaps identified? No  2. Drug Therapy Problems identified No  3. Assessment Controlled  4. Other notes [name], Health Concierge [phone]

## 2021-02-24 DIAGNOSIS — I10 Essential (primary) hypertension: Secondary | ICD-10-CM | POA: Diagnosis not present

## 2021-02-24 DIAGNOSIS — E039 Hypothyroidism, unspecified: Secondary | ICD-10-CM | POA: Diagnosis not present

## 2021-02-24 DIAGNOSIS — I5032 Chronic diastolic (congestive) heart failure: Secondary | ICD-10-CM | POA: Diagnosis not present

## 2021-02-26 ENCOUNTER — Ambulatory Visit: Payer: Self-pay | Admitting: Surgery

## 2021-02-26 DIAGNOSIS — R152 Fecal urgency: Secondary | ICD-10-CM | POA: Diagnosis not present

## 2021-02-26 DIAGNOSIS — K6289 Other specified diseases of anus and rectum: Secondary | ICD-10-CM | POA: Diagnosis not present

## 2021-02-26 DIAGNOSIS — R159 Full incontinence of feces: Secondary | ICD-10-CM | POA: Diagnosis not present

## 2021-02-26 DIAGNOSIS — K642 Third degree hemorrhoids: Secondary | ICD-10-CM | POA: Diagnosis not present

## 2021-02-26 DIAGNOSIS — K582 Mixed irritable bowel syndrome: Secondary | ICD-10-CM | POA: Diagnosis not present

## 2021-02-26 DIAGNOSIS — K439 Ventral hernia without obstruction or gangrene: Secondary | ICD-10-CM | POA: Diagnosis not present

## 2021-02-26 DIAGNOSIS — N393 Stress incontinence (female) (male): Secondary | ICD-10-CM | POA: Diagnosis not present

## 2021-03-03 ENCOUNTER — Other Ambulatory Visit: Payer: Self-pay | Admitting: Surgery

## 2021-03-03 DIAGNOSIS — R159 Full incontinence of feces: Secondary | ICD-10-CM

## 2021-03-24 ENCOUNTER — Ambulatory Visit (INDEPENDENT_AMBULATORY_CARE_PROVIDER_SITE_OTHER): Payer: PPO

## 2021-03-24 ENCOUNTER — Other Ambulatory Visit: Payer: Self-pay

## 2021-03-24 ENCOUNTER — Telehealth: Payer: Self-pay

## 2021-03-24 VITALS — BP 108/68 | HR 47 | Temp 97.2°F | Wt 167.0 lb

## 2021-03-24 DIAGNOSIS — E039 Hypothyroidism, unspecified: Secondary | ICD-10-CM

## 2021-03-24 DIAGNOSIS — Z23 Encounter for immunization: Secondary | ICD-10-CM

## 2021-03-24 LAB — TSH: TSH: 1.31 mIU/L (ref 0.40–4.50)

## 2021-03-24 MED ORDER — LEVOTHYROXINE SODIUM 125 MCG PO TABS
ORAL_TABLET | ORAL | 0 refills | Status: DC
  Filled 2021-03-27: qty 60, 84d supply, fill #0
  Filled 2021-06-04: qty 60, 84d supply, fill #1

## 2021-03-24 NOTE — Progress Notes (Signed)
Patient presents to the office for a nurse visit to check TSH and get a flu vaccine. Verified the way the patient is taking her Levothyroxine. 1 tablet on M, W, F and 1/2 tablet all other days.

## 2021-03-24 NOTE — Progress Notes (Signed)
Patient has checked her scale RPM 27 times the month of November. Her readings are below.  03/23/2021 Sunday at 09:46 AM 166.7 03/22/2021 Saturday at 08:17 AM 165.8 03/21/2021 Friday at 08:04 AM 165.8 03/20/2021 Thursday at 08:03 AM 165.1 03/19/2021 Wednesday at 08:19 AM 165.1 03/18/2021 Tuesday at 09:02 AM 164.9 03/17/2021 Monday at 11:22 AM 165.1 03/16/2021 Sunday at 11:29 AM 165.8 03/15/2021 Saturday at 07:03 AM 165.1 03/14/2021 Friday at 10:03 AM 163.8 03/13/2021 Thursday at 10:40 AM 165.3 03/12/2021 Wednesday at 07:37 AM 166 03/11/2021 Tuesday at 07:55 AM 163.8 03/10/2021 Monday at 06:49 AM 167.6 03/09/2021 Sunday at 12:06 PM 167.6 03/09/2021 Sunday at 11:39 AM 168 03/08/2021 Saturday at 07:54 AM 165.6 03/07/2021 Friday at 07:04 AM 162.9 03/06/2021 Thursday at 09:41 AM 164.5 03/05/2021 Wednesday at 07:55 AM 164.5 03/04/2021 Tuesday at 10:39 AM 167.1 03/03/2021 Monday at 02:34 PM 165.1 03/02/2021 Sunday at 06:45 AM 165.3 03/01/2021 Saturday at 10:47 AM 166.7 02/28/2021 Friday at 09:35 AM 164.7 02/27/2021 Thursday at 01:56 PM 167.8 02/26/2021 Wednesday at 07:39 AM 164.7 02/25/2021 Tuesday at [place] PM [place] [phone]

## 2021-03-25 ENCOUNTER — Ambulatory Visit
Admission: RE | Admit: 2021-03-25 | Discharge: 2021-03-25 | Disposition: A | Discharge status: Home / Self Care | Payer: PPO | Source: Ambulatory Visit | Attending: Surgery | Admitting: Surgery

## 2021-03-25 DIAGNOSIS — N8110 Cystocele, unspecified: Secondary | ICD-10-CM | POA: Diagnosis not present

## 2021-03-25 DIAGNOSIS — N8189 Other female genital prolapse: Secondary | ICD-10-CM | POA: Diagnosis not present

## 2021-03-25 DIAGNOSIS — K623 Rectal prolapse: Secondary | ICD-10-CM | POA: Diagnosis not present

## 2021-03-25 DIAGNOSIS — N816 Rectocele: Secondary | ICD-10-CM | POA: Diagnosis not present

## 2021-03-25 DIAGNOSIS — R159 Full incontinence of feces: Secondary | ICD-10-CM

## 2021-03-26 DIAGNOSIS — I10 Essential (primary) hypertension: Secondary | ICD-10-CM | POA: Diagnosis not present

## 2021-03-26 DIAGNOSIS — E039 Hypothyroidism, unspecified: Secondary | ICD-10-CM | POA: Diagnosis not present

## 2021-03-26 DIAGNOSIS — I5032 Chronic diastolic (congestive) heart failure: Secondary | ICD-10-CM | POA: Diagnosis not present

## 2021-03-27 ENCOUNTER — Other Ambulatory Visit (HOSPITAL_COMMUNITY): Payer: Self-pay

## 2021-03-29 ENCOUNTER — Encounter: Payer: Self-pay | Admitting: Adult Health

## 2021-04-04 ENCOUNTER — Encounter (HOSPITAL_BASED_OUTPATIENT_CLINIC_OR_DEPARTMENT_OTHER): Payer: Self-pay | Admitting: Surgery

## 2021-04-07 ENCOUNTER — Encounter (HOSPITAL_COMMUNITY)
Admission: RE | Admit: 2021-04-07 | Discharge: 2021-04-07 | Disposition: A | Discharge status: Home / Self Care | Payer: PPO | Source: Ambulatory Visit | Attending: Surgery | Admitting: Surgery

## 2021-04-07 ENCOUNTER — Encounter (HOSPITAL_BASED_OUTPATIENT_CLINIC_OR_DEPARTMENT_OTHER): Payer: Self-pay | Admitting: Surgery

## 2021-04-07 ENCOUNTER — Other Ambulatory Visit: Payer: Self-pay

## 2021-04-07 DIAGNOSIS — I10 Essential (primary) hypertension: Secondary | ICD-10-CM

## 2021-04-07 DIAGNOSIS — N1831 Chronic kidney disease, stage 3a: Secondary | ICD-10-CM

## 2021-04-07 DIAGNOSIS — Z01812 Encounter for preprocedural laboratory examination: Secondary | ICD-10-CM | POA: Insufficient documentation

## 2021-04-07 LAB — BASIC METABOLIC PANEL
Anion gap: 5 (ref 5–15)
BUN: 27 mg/dL — ABNORMAL HIGH (ref 8–23)
CO2: 27 mmol/L (ref 22–32)
Calcium: 8.6 mg/dL — ABNORMAL LOW (ref 8.9–10.3)
Chloride: 103 mmol/L (ref 98–111)
Creatinine, Ser: 1.13 mg/dL — ABNORMAL HIGH (ref 0.44–1.00)
GFR, Estimated: 53 mL/min — ABNORMAL LOW (ref >60)
Glucose, Bld: 88 mg/dL (ref 70–99)
Potassium: 4.6 mmol/L (ref 3.5–5.1)
Sodium: 135 mmol/L (ref 135–145)

## 2021-04-07 LAB — CBC
HCT: 42.8 % (ref 36.0–46.0)
Hemoglobin: 13.9 g/dL (ref 12.0–15.0)
MCH: 30.2 pg (ref 26.0–34.0)
MCHC: 32.5 g/dL (ref 30.0–36.0)
MCV: 92.8 fL (ref 80.0–100.0)
Platelets: 289 10*3/uL (ref 150–400)
RBC: 4.61 MIL/uL (ref 3.87–5.11)
RDW: 14 % (ref 11.5–15.5)
WBC: 4.5 10*3/uL (ref 4.0–10.5)
nRBC: 0 % (ref 0.0–0.2)

## 2021-04-07 NOTE — Progress Notes (Addendum)
Spoke w/ via phone for pre-op interview---pt Lab needs dos---- no              Lab results------ pt had lab work done 04-07-2021 CBC/ BMP results in epic;  current ekg in epic/ chart COVID test -----patient states asymptomatic no test needed Arrive at ------- 0530 on 04-10-2021 NPO after MN NO Solid Food.  Clear liquids from MN until--- 0430 Med rec completed Medications to take morning of surgery ----- wellbutrin, zoloft, zyrtec, atenolol, guaifenesin, synthroid, pepcid Diabetic medication ----- n/a Patient instructed no nail polish to be worn day of surgery Patient instructed to bring photo id and insurance card day of surgery Patient aware to have Driver (ride ) / caregiver  for 24 hours after surgery -- daughter, lori [place]bank Patient Special Instructions ----- asked pt to bring rescue inhaler dos, stated if she can find it she will Pre-Op special Istructions ----- n/a Patient verbalized understanding of instructions that were given at this phone interview. Patient denies shortness of breath, chest pain, fever, cough at this phone interview.    Anesthesia Review: HTN; known chronic LBBB;  chronic diastlic CHF;  Emphysema; CKD 3;  OSA no cpap, intolerant; Pt denies cardiac s&s, sob, and no peripheral swelling.  Pt stated last used rescue inhaler / nebulizer over a year ago.   PCP: Dr Farris Has ([place] 02-06-2021 epic Cardiologist : Dr Audie Box ([place] 12-07-2019 epic)  Pt stated followed by pcp , only following cardio as needed Nephrologist:  Dr Marval Regal [facility] 01-23-2021 note w/ chart) Chest x-ray : 01-31-2021 epic EKG : 06-14-2020 epic Echo : 03-10-2019 epic Stress test: nuclear 06-28-2015 epic Cardiac Cath :  nbo Activity level: denies sob w/ any activity Sleep Study/ CPAP : Yes/ No  Blood Thinner/ Instructions /Last Dose: no ASA / Instructions/ Last Dose :  ASA 81mg /  pt stated was not given instructions by dr gross if needed to stop prior to surgery, advised pt to call dr gross

## 2021-04-08 ENCOUNTER — Encounter: Payer: Self-pay | Admitting: Adult Health

## 2021-04-09 ENCOUNTER — Other Ambulatory Visit: Payer: Self-pay | Admitting: Adult Health

## 2021-04-09 DIAGNOSIS — K580 Irritable bowel syndrome with diarrhea: Secondary | ICD-10-CM

## 2021-04-09 MED ORDER — HYOSCYAMINE SULFATE 0.125 MG SL SUBL: SUBLINGUAL_TABLET | SUBLINGUAL | 3 refills | Status: DC

## 2021-04-09 NOTE — Anesthesia Preprocedure Evaluation (Addendum)
Anesthesia Evaluation  Patient identified by MRN, date of birth, ID band Patient awake    Reviewed: Allergy & Precautions, NPO status , Patient's Chart, lab work & pertinent test results  History of Anesthesia Complications Negative for: history of anesthetic complications  Airway Mallampati: I  TM Distance: >3 FB Neck ROM: Full    Dental  (+) Dental Advisory Given, Teeth Intact   Pulmonary COPD, former smoker,    Pulmonary exam normal        Cardiovascular hypertension, +CHF  Normal cardiovascular exam   EKG: LBBB Echo 2020: EF 55-60%, g1dd, no RWMA, nl RVSF, mod RVE, mild RAE, mild-mod TR, mild pulm HTN (36 mmHg) Normal myoview 2017   Neuro/Psych  Headaches, Anxiety Depression    GI/Hepatic Neg liver ROS, hiatal hernia, GERD  ,  Endo/Other  Hypothyroidism   Renal/GU CRFRenal disease (CKD3, Cr 1.13)  negative genitourinary   Musculoskeletal negative musculoskeletal ROS (+)   Abdominal   Peds  Hematology negative hematology ROS (+)   Anesthesia Other Findings   Reproductive/Obstetrics                          Anesthesia Physical Anesthesia Plan  ASA: 3  Anesthesia Plan: General   Post-op Pain Management: Tylenol PO (pre-op) and Toradol IV (intra-op)   Induction: Intravenous  PONV Risk Score and Plan: 4 or greater and Ondansetron, Dexamethasone, Treatment may vary due to age or medical condition and Midazolam  Airway Management Planned: Oral ETT  Additional Equipment: None  Intra-op Plan:   Post-operative Plan: Extubation in OR  Informed Consent: I have reviewed the patients History and Physical, chart, labs and discussed the procedure including the risks, benefits and alternatives for the proposed anesthesia with the patient or authorized representative who has indicated his/her understanding and acceptance.     Dental advisory given  Plan Discussed with:   Anesthesia  Plan Comments:        Anesthesia Quick Evaluation

## 2021-04-10 ENCOUNTER — Encounter (HOSPITAL_BASED_OUTPATIENT_CLINIC_OR_DEPARTMENT_OTHER)
Admission: RE | Disposition: A | Discharge status: Home / Self Care | Payer: Self-pay | Source: Ambulatory Visit | Attending: Surgery

## 2021-04-10 ENCOUNTER — Ambulatory Visit (HOSPITAL_BASED_OUTPATIENT_CLINIC_OR_DEPARTMENT_OTHER): Payer: PPO | Admitting: Anesthesiology

## 2021-04-10 ENCOUNTER — Other Ambulatory Visit: Payer: Self-pay

## 2021-04-10 ENCOUNTER — Other Ambulatory Visit (HOSPITAL_COMMUNITY): Payer: Self-pay

## 2021-04-10 ENCOUNTER — Encounter (HOSPITAL_BASED_OUTPATIENT_CLINIC_OR_DEPARTMENT_OTHER): Payer: Self-pay | Admitting: Surgery

## 2021-04-10 ENCOUNTER — Ambulatory Visit (HOSPITAL_BASED_OUTPATIENT_CLINIC_OR_DEPARTMENT_OTHER)
Admission: RE | Admit: 2021-04-10 | Discharge: 2021-04-10 | Disposition: A | Discharge status: Home / Self Care | Payer: PPO | Source: Ambulatory Visit | Attending: Surgery | Admitting: Surgery

## 2021-04-10 DIAGNOSIS — Z79899 Other long term (current) drug therapy: Secondary | ICD-10-CM | POA: Diagnosis not present

## 2021-04-10 DIAGNOSIS — K219 Gastro-esophageal reflux disease without esophagitis: Secondary | ICD-10-CM | POA: Diagnosis not present

## 2021-04-10 DIAGNOSIS — I129 Hypertensive chronic kidney disease with stage 1 through stage 4 chronic kidney disease, or unspecified chronic kidney disease: Secondary | ICD-10-CM | POA: Diagnosis not present

## 2021-04-10 DIAGNOSIS — K439 Ventral hernia without obstruction or gangrene: Secondary | ICD-10-CM | POA: Diagnosis not present

## 2021-04-10 DIAGNOSIS — G4730 Sleep apnea, unspecified: Secondary | ICD-10-CM | POA: Insufficient documentation

## 2021-04-10 DIAGNOSIS — J449 Chronic obstructive pulmonary disease, unspecified: Secondary | ICD-10-CM | POA: Diagnosis not present

## 2021-04-10 DIAGNOSIS — K582 Mixed irritable bowel syndrome: Secondary | ICD-10-CM | POA: Insufficient documentation

## 2021-04-10 DIAGNOSIS — E039 Hypothyroidism, unspecified: Secondary | ICD-10-CM | POA: Diagnosis not present

## 2021-04-10 DIAGNOSIS — E785 Hyperlipidemia, unspecified: Secondary | ICD-10-CM | POA: Insufficient documentation

## 2021-04-10 DIAGNOSIS — K4020 Bilateral inguinal hernia, without obstruction or gangrene, not specified as recurrent: Secondary | ICD-10-CM | POA: Diagnosis not present

## 2021-04-10 DIAGNOSIS — N189 Chronic kidney disease, unspecified: Secondary | ICD-10-CM | POA: Insufficient documentation

## 2021-04-10 DIAGNOSIS — I447 Left bundle-branch block, unspecified: Secondary | ICD-10-CM | POA: Diagnosis not present

## 2021-04-10 HISTORY — PX: INGUINAL HERNIA REPAIR: SHX194

## 2021-04-10 HISTORY — DX: Personal history of other infectious and parasitic diseases: Z86.19

## 2021-04-10 HISTORY — DX: Other hemorrhoids: K64.8

## 2021-04-10 HISTORY — DX: Hypothyroidism, unspecified: E03.9

## 2021-04-10 HISTORY — DX: Interstitial cystitis (chronic) without hematuria: N30.10

## 2021-04-10 HISTORY — DX: Chronic diastolic (congestive) heart failure: I50.32

## 2021-04-10 HISTORY — DX: Mixed irritable bowel syndrome: K58.2

## 2021-04-10 HISTORY — DX: Bilateral inguinal hernia, without obstruction or gangrene, not specified as recurrent: K40.20

## 2021-04-10 HISTORY — DX: Personal history of urinary (tract) infections: Z87.440

## 2021-04-10 HISTORY — DX: Obstructive sleep apnea (adult) (pediatric): G47.33

## 2021-04-10 HISTORY — DX: Personal history of other diseases of urinary system: Z87.448

## 2021-04-10 HISTORY — DX: Diaphragmatic hernia without obstruction or gangrene: K44.9

## 2021-04-10 HISTORY — DX: Diverticulosis of large intestine without perforation or abscess without bleeding: K57.30

## 2021-04-10 HISTORY — DX: Mixed hyperlipidemia: E78.2

## 2021-04-10 HISTORY — DX: Other seasonal allergic rhinitis: J30.2

## 2021-04-10 HISTORY — DX: Personal history of transient ischemic attack (TIA), and cerebral infarction without residual deficits: Z86.73

## 2021-04-10 HISTORY — DX: Other specified disorders of bone density and structure, unspecified site: M85.80

## 2021-04-10 SURGERY — REPAIR, HERNIA, INGUINAL, LAPAROSCOPIC
Anesthesia: General | Site: Groin

## 2021-04-10 MED ORDER — OXYCODONE HCL 5 MG PO TABS
ORAL_TABLET | ORAL | Status: AC
  Filled 2021-04-10: qty 1

## 2021-04-10 MED ORDER — TRAMADOL HCL 50 MG PO TABS
50.0000 mg | ORAL_TABLET | Freq: Four times a day (QID) | ORAL | 0 refills | Status: DC | PRN
  Filled 2021-04-10: qty 20, 3d supply, fill #0

## 2021-04-10 MED ORDER — CHLORHEXIDINE GLUCONATE CLOTH 2 % EX PADS: 6.0000 | MEDICATED_PAD | Freq: Once | CUTANEOUS | Status: DC

## 2021-04-10 MED ORDER — ORAL CARE MOUTH RINSE: 15.0000 mL | Freq: Once | OROMUCOSAL | Status: DC

## 2021-04-10 MED ORDER — GLYCOPYRROLATE PF 0.2 MG/ML IJ SOSY
PREFILLED_SYRINGE | INTRAMUSCULAR | Status: AC
  Filled 2021-04-10: qty 1

## 2021-04-10 MED ORDER — LIDOCAINE 2% (20 MG/ML) 5 ML SYRINGE
INTRAMUSCULAR | Status: AC
  Filled 2021-04-10: qty 5

## 2021-04-10 MED ORDER — MIDAZOLAM HCL 5 MG/5ML IJ SOLN
INTRAMUSCULAR | Status: DC | PRN
  Administered 2021-04-10: 2 mg via INTRAVENOUS

## 2021-04-10 MED ORDER — ONDANSETRON HCL 4 MG/2ML IJ SOLN
INTRAMUSCULAR | Status: AC
  Filled 2021-04-10: qty 2

## 2021-04-10 MED ORDER — CLINDAMYCIN PHOSPHATE 900 MG/50ML IV SOLN
900.0000 mg | INTRAVENOUS | Status: AC
  Administered 2021-04-10: 900 mg via INTRAVENOUS

## 2021-04-10 MED ORDER — EPHEDRINE 5 MG/ML INJ
INTRAVENOUS | Status: AC
  Filled 2021-04-10: qty 5

## 2021-04-10 MED ORDER — SODIUM CHLORIDE 0.9 % IR SOLN
Status: DC | PRN
  Administered 2021-04-10: 1000 mL

## 2021-04-10 MED ORDER — GENTAMICIN SULFATE 40 MG/ML IJ SOLN
5.0000 mg/kg | INTRAVENOUS | Status: AC
  Administered 2021-04-10: 380 mg via INTRAVENOUS
  Filled 2021-04-10: qty 9.5

## 2021-04-10 MED ORDER — CHLORHEXIDINE GLUCONATE 0.12 % MT SOLN: 15.0000 mL | Freq: Once | OROMUCOSAL | Status: DC

## 2021-04-10 MED ORDER — GLYCOPYRROLATE 0.2 MG/ML IJ SOLN
INTRAMUSCULAR | Status: DC | PRN
Start: 2021-04-10 — End: 2021-04-10
  Administered 2021-04-10: .2 mg via INTRAVENOUS

## 2021-04-10 MED ORDER — FENTANYL CITRATE (PF) 250 MCG/5ML IJ SOLN
INTRAMUSCULAR | Status: AC
  Filled 2021-04-10: qty 5

## 2021-04-10 MED ORDER — CLINDAMYCIN PHOSPHATE 900 MG/50ML IV SOLN
INTRAVENOUS | Status: AC
  Filled 2021-04-10: qty 50

## 2021-04-10 MED ORDER — ACETAMINOPHEN 500 MG PO TABS
1000.0000 mg | ORAL_TABLET | Freq: Once | ORAL | Status: AC
  Administered 2021-04-10: 1000 mg via ORAL

## 2021-04-10 MED ORDER — DEXAMETHASONE SODIUM PHOSPHATE 10 MG/ML IJ SOLN
INTRAMUSCULAR | Status: AC
  Filled 2021-04-10: qty 1

## 2021-04-10 MED ORDER — KETOROLAC TROMETHAMINE 30 MG/ML IJ SOLN
INTRAMUSCULAR | Status: DC | PRN
  Administered 2021-04-10: 15 mg via INTRAVENOUS

## 2021-04-10 MED ORDER — EPHEDRINE SULFATE-NACL 50-0.9 MG/10ML-% IV SOSY
PREFILLED_SYRINGE | INTRAVENOUS | Status: DC | PRN
  Administered 2021-04-10: 15 mg via INTRAVENOUS
  Administered 2021-04-10: 5 mg via INTRAVENOUS
  Administered 2021-04-10: 10 mg via INTRAVENOUS

## 2021-04-10 MED ORDER — ENSURE PRE-SURGERY PO LIQD
296.0000 mL | Freq: Once | ORAL | Status: AC
  Administered 2021-04-10: 296 mL via ORAL

## 2021-04-10 MED ORDER — LACTATED RINGERS IV SOLN: INTRAVENOUS | Status: DC

## 2021-04-10 MED ORDER — ROCURONIUM BROMIDE 10 MG/ML (PF) SYRINGE
PREFILLED_SYRINGE | INTRAVENOUS | Status: DC | PRN
  Administered 2021-04-10: 50 mg via INTRAVENOUS
  Administered 2021-04-10: 20 mg via INTRAVENOUS

## 2021-04-10 MED ORDER — ROCURONIUM BROMIDE 10 MG/ML (PF) SYRINGE
PREFILLED_SYRINGE | INTRAVENOUS | Status: AC
  Filled 2021-04-10: qty 10

## 2021-04-10 MED ORDER — BUPIVACAINE-EPINEPHRINE 0.25% -1:200000 IJ SOLN
INTRAMUSCULAR | Status: AC
  Filled 2021-04-10: qty 1

## 2021-04-10 MED ORDER — OXYCODONE HCL 5 MG/5ML PO SOLN: 5.0000 mg | Freq: Once | ORAL | Status: AC | PRN

## 2021-04-10 MED ORDER — SUGAMMADEX SODIUM 200 MG/2ML IV SOLN
INTRAVENOUS | Status: DC | PRN
Start: 2021-04-10 — End: 2021-04-10
  Administered 2021-04-10: 200 mg via INTRAVENOUS

## 2021-04-10 MED ORDER — OXYCODONE HCL 5 MG PO TABS
5.0000 mg | ORAL_TABLET | Freq: Once | ORAL | Status: AC | PRN
  Administered 2021-04-10: 5 mg via ORAL

## 2021-04-10 MED ORDER — ONDANSETRON HCL 4 MG/2ML IJ SOLN: 4.0000 mg | Freq: Once | INTRAMUSCULAR | Status: DC | PRN

## 2021-04-10 MED ORDER — MIDAZOLAM HCL 2 MG/2ML IJ SOLN
INTRAMUSCULAR | Status: AC
  Filled 2021-04-10: qty 2

## 2021-04-10 MED ORDER — BUPIVACAINE-EPINEPHRINE 0.25% -1:200000 IJ SOLN
INTRAMUSCULAR | Status: DC | PRN
  Administered 2021-04-10: 50 mL

## 2021-04-10 MED ORDER — ONDANSETRON HCL 4 MG/2ML IJ SOLN
INTRAMUSCULAR | Status: DC | PRN
  Administered 2021-04-10: 4 mg via INTRAVENOUS

## 2021-04-10 MED ORDER — ACETAMINOPHEN 500 MG PO TABS
1000.0000 mg | ORAL_TABLET | ORAL | Status: AC
  Administered 2021-04-10: 1000 mg via ORAL

## 2021-04-10 MED ORDER — BUPIVACAINE LIPOSOME 1.3 % IJ SUSP: 20.0000 mL | Freq: Once | INTRAMUSCULAR | Status: DC

## 2021-04-10 MED ORDER — AMISULPRIDE (ANTIEMETIC) 5 MG/2ML IV SOLN: 10.0000 mg | Freq: Once | INTRAVENOUS | Status: DC | PRN

## 2021-04-10 MED ORDER — FENTANYL CITRATE (PF) 100 MCG/2ML IJ SOLN
INTRAMUSCULAR | Status: DC | PRN
  Administered 2021-04-10 (×2): 50 ug via INTRAVENOUS
  Administered 2021-04-10: 100 ug via INTRAVENOUS

## 2021-04-10 MED ORDER — DEXAMETHASONE SODIUM PHOSPHATE 10 MG/ML IJ SOLN
INTRAMUSCULAR | Status: DC | PRN
  Administered 2021-04-10: 10 mg via INTRAVENOUS

## 2021-04-10 MED ORDER — BUPIVACAINE LIPOSOME 1.3 % IJ SUSP
INTRAMUSCULAR | Status: DC | PRN
  Administered 2021-04-10: 20 mL

## 2021-04-10 MED ORDER — PROPOFOL 10 MG/ML IV BOLUS
INTRAVENOUS | Status: AC
  Filled 2021-04-10: qty 20

## 2021-04-10 MED ORDER — PHENYLEPHRINE HCL-NACL 20-0.9 MG/250ML-% IV SOLN
INTRAVENOUS | Status: DC | PRN
  Administered 2021-04-10: 40 ug/min via INTRAVENOUS

## 2021-04-10 MED ORDER — ACETAMINOPHEN 500 MG PO TABS
ORAL_TABLET | ORAL | Status: AC
  Filled 2021-04-10: qty 2

## 2021-04-10 MED ORDER — KETOROLAC TROMETHAMINE 30 MG/ML IJ SOLN
INTRAMUSCULAR | Status: AC
  Filled 2021-04-10: qty 1

## 2021-04-10 MED ORDER — FENTANYL CITRATE (PF) 100 MCG/2ML IJ SOLN: 25.0000 ug | INTRAMUSCULAR | Status: DC | PRN

## 2021-04-10 MED ORDER — LIDOCAINE 2% (20 MG/ML) 5 ML SYRINGE
INTRAMUSCULAR | Status: DC | PRN
Start: 2021-04-10 — End: 2021-04-10
  Administered 2021-04-10: 100 mg via INTRAVENOUS

## 2021-04-10 MED ORDER — PROPOFOL 10 MG/ML IV BOLUS
INTRAVENOUS | Status: DC | PRN
  Administered 2021-04-10: 130 mg via INTRAVENOUS

## 2021-04-10 MED ORDER — BUPIVACAINE LIPOSOME 1.3 % IJ SUSP
INTRAMUSCULAR | Status: AC
  Filled 2021-04-10: qty 20

## 2021-04-10 MED ORDER — PHENYLEPHRINE 40 MCG/ML (10ML) SYRINGE FOR IV PUSH (FOR BLOOD PRESSURE SUPPORT)
PREFILLED_SYRINGE | INTRAVENOUS | Status: AC
  Filled 2021-04-10: qty 10

## 2021-04-10 MED ORDER — PHENYLEPHRINE 40 MCG/ML (10ML) SYRINGE FOR IV PUSH (FOR BLOOD PRESSURE SUPPORT)
PREFILLED_SYRINGE | INTRAVENOUS | Status: DC | PRN
  Administered 2021-04-10 (×2): 120 ug via INTRAVENOUS

## 2021-04-10 SURGICAL SUPPLY — 45 items
APL PRP STRL LF DISP 70% ISPRP (MISCELLANEOUS) ×2
CABLE HIGH FREQUENCY MONO STRZ (ELECTRODE) ×3 IMPLANT
CHLORAPREP W/TINT 26 (MISCELLANEOUS) ×5 IMPLANT
DECANTER SPIKE VIAL GLASS SM (MISCELLANEOUS) ×3 IMPLANT
DEVICE SECURE STRAP 25 ABSORB (INSTRUMENTS) IMPLANT
DRAPE WARM FLUID 44X44 (DRAPES) ×3 IMPLANT
DRSG TEGADERM 2-3/8X2-3/4 SM (GAUZE/BANDAGES/DRESSINGS) ×4 IMPLANT
DRSG TEGADERM 4X4.75 (GAUZE/BANDAGES/DRESSINGS) ×3 IMPLANT
ELECT REM PT RETURN 9FT ADLT (ELECTROSURGICAL) ×3
ELECTRODE REM PT RTRN 9FT ADLT (ELECTROSURGICAL) ×2 IMPLANT
GAUZE 4X4 16PLY ~~LOC~~+RFID DBL (SPONGE) ×3 IMPLANT
GAUZE SPONGE 2X2 8PLY STRL LF (GAUZE/BANDAGES/DRESSINGS) IMPLANT
GLOVE SRG 8 PF TXTR STRL LF DI (GLOVE) ×2 IMPLANT
GLOVE SURG LTX SZ8 (GLOVE) ×1 IMPLANT
GLOVE SURG UNDER POLY LF SZ8 (GLOVE) ×3
GOWN STRL REUS W/TWL XL LVL3 (GOWN DISPOSABLE) ×7 IMPLANT
IRRIG SUCT STRYKERFLOW 2 WTIP (MISCELLANEOUS) ×3
IRRIGATION SUCT STRKRFLW 2 WTP (MISCELLANEOUS) IMPLANT
KIT TURNOVER CYSTO (KITS) ×3 IMPLANT
MANIFOLD NEPTUNE II (INSTRUMENTS) IMPLANT
MESH BARD SOFT 6X6IN (Mesh General) ×6 IMPLANT
NEEDLE HYPO 22GX1.5 SAFETY (NEEDLE) ×3 IMPLANT
NS IRRIG 500ML POUR BTL (IV SOLUTION) ×2 IMPLANT
PACK BASIN DAY SURGERY FS (CUSTOM PROCEDURE TRAY) ×3 IMPLANT
PAD POSITIONING PINK XL (MISCELLANEOUS) ×3 IMPLANT
SCISSORS LAP 5X35 DISP (ENDOMECHANICALS) ×3 IMPLANT
SET TUBE SMOKE EVAC HIGH FLOW (TUBING) ×4 IMPLANT
SLEEVE ADV FIXATION 5X100MM (TROCAR) ×3 IMPLANT
SLEEVE SURGEON STRL (DRAPES) ×2 IMPLANT
SPONGE GAUZE 2X2 8PLY STER LF (GAUZE/BANDAGES/DRESSINGS)
SPONGE GAUZE 2X2 8PLY STRL LF (GAUZE/BANDAGES/DRESSINGS) ×3 IMPLANT
SPONGE GAUZE 2X2 STER 10/PKG (GAUZE/BANDAGES/DRESSINGS) ×2
SPONGE T-LAP 18X18 ~~LOC~~+RFID (SPONGE) ×1 IMPLANT
SUT MNCRL AB 4-0 PS2 18 (SUTURE) ×3 IMPLANT
SUT PDS AB 1 CT1 27 (SUTURE) ×2 IMPLANT
SUT VIC AB 2-0 SH 27 (SUTURE) ×6
SUT VIC AB 2-0 SH 27XBRD (SUTURE) IMPLANT
SUT VICRYL 0 UR6 27IN ABS (SUTURE) IMPLANT
TOWEL OR 17X26 10 PK STRL BLUE (TOWEL DISPOSABLE) ×3 IMPLANT
TRAY DSU PREP LF (CUSTOM PROCEDURE TRAY) ×3 IMPLANT
TRAY LAPAROSCOPIC (CUSTOM PROCEDURE TRAY) ×3 IMPLANT
TROCAR ADV FIXATION 5X100MM (TROCAR) ×3 IMPLANT
TROCAR XCEL BLUNT TIP 100MML (ENDOMECHANICALS) ×3 IMPLANT
WATER STERILE IRR 1000ML POUR (IV SOLUTION) ×4 IMPLANT
WATER STERILE IRR 500ML POUR (IV SOLUTION) ×1 IMPLANT

## 2021-04-10 NOTE — Anesthesia Procedure Notes (Addendum)
Procedure Name: Intubation Date/Time: 04/10/2021 7:33 AM Performed by: Rogers Blocker, CRNA Pre-anesthesia Checklist: Patient identified, Emergency Drugs available, Suction available and Patient being monitored Patient Re-evaluated:Patient Re-evaluated prior to induction Oxygen Delivery Method: Circle System Utilized Preoxygenation: Pre-oxygenation with 100% oxygen Induction Type: IV induction Ventilation: Mask ventilation without difficulty Laryngoscope Size: Mac and 3 Grade View: Grade I Tube type: Oral Tube size: 7.0 mm Number of attempts: 1 Airway Equipment and Method: Stylet Placement Confirmation: ETT inserted through vocal cords under direct vision, positive ETCO2 and breath sounds checked- equal and bilateral Secured at: 22 cm Tube secured with: Tape Dental Injury: Teeth and Oropharynx as per pre-operative assessment

## 2021-04-10 NOTE — Discharge Instructions (Addendum)
HERNIA REPAIR: POST OP INSTRUCTIONS  ######################################################################  EAT Gradually transition to a high fiber diet with a fiber supplement over the next few weeks after discharge.  Start with a pureed / full liquid diet (see below)  WALK Walk an hour a day.  Control your pain to do that.    CONTROL PAIN Control pain so that you can walk, sleep, tolerate sneezing/coughing, and go up/down stairs.  HAVE A BOWEL MOVEMENT DAILY Keep your bowels regular to avoid problems.  OK to try a laxative to override constipation.  OK to use an antidairrheal to slow down diarrhea.  Call if not better after 2 tries  CALL IF YOU HAVE PROBLEMS/CONCERNS Call if you are still struggling despite following these instructions. Call if you have concerns not answered by these instructions  ######################################################################    DIET: Follow a light bland diet & liquids the first 24 hours after arrival home, such as soup, liquids, starches, etc.  Be sure to drink plenty of fluids.  Quickly advance to a usual solid diet within a few days.  Avoid fast food or heavy meals as your are more likely to get nauseated or have irregular bowels.  A low-fat, high-fiber diet for the rest of your life is ideal.   Take your usually prescribed home medications unless otherwise directed.  PAIN CONTROL: Pain is best controlled by a usual combination of three different methods TOGETHER: Ice/Heat Over the counter pain medication Prescription pain medication Most patients will experience some swelling and bruising around the hernia(s) such as the bellybutton, groins, or old incisions.  Ice packs or heating pads (30-60 minutes up to 6 times a day) will help. Use ice for the first few days to help decrease swelling and bruising, then switch to heat to help relax tight/sore spots and speed recovery.  Some people prefer to use ice alone, heat alone, alternating  between ice & heat.  Experiment to what works for you.  Swelling and bruising can take several weeks to resolve.   It is helpful to take an over-the-counter pain medication regularly for the first few weeks.  Choose one of the following that works best for you: Naproxen (Aleve, etc)  Two 220mg tabs twice a day Ibuprofen (Advil, etc) Three 200mg tabs four times a day (every meal & bedtime) Acetaminophen (Tylenol, etc) 325-650mg four times a day (every meal & bedtime) A  prescription for pain medication should be given to you upon discharge.  Take your pain medication as prescribed.  If you are having problems/concerns with the prescription medicine (does not control pain, nausea, vomiting, rash, itching, etc), please call us [phone] to see if we need to switch you to a different pain medicine that will work better for you and/or control your side effect better. If you need a refill on your pain medication, please contact your pharmacy.  They will contact our office to request authorization. Prescriptions will not be filled after 5 pm or on week-ends.  Avoid getting constipated.  Between the surgery and the pain medications, it is common to experience some constipation.  Increasing fluid intake and taking a fiber supplement (such as Metamucil, Citrucel, FiberCon, MiraLax, etc) 1-2 times a day regularly will usually help prevent this problem from occurring.  A mild laxative (prune juice, Milk of Magnesia, MiraLax, etc) should be taken according to package directions if there are no bowel movements after 48 hours.    Wash / shower every day.  You may shower over the dressings   as they are waterproof.    Remove your waterproof bandages, skin tapes, and other bandages 3 days after surgery. You may replace a dressing/Band-Aid to cover the incision for comfort if you wish. You may leave the incisions open to air.  You may replace a dressing/Band-Aid to cover an incision for comfort if you wish.  Continue  to shower over incision(s) after the dressing is off.  ACTIVITIES as tolerated:   You may resume regular (light) daily activities beginning the next day--such as daily self-care, walking, climbing stairs--gradually increasing activities as tolerated.  Control your pain so that you can walk an hour a day.  If you can walk 30 minutes without difficulty, it is safe to try more intense activity such as jogging, treadmill, bicycling, low-impact aerobics, swimming, etc. Save the most intensive and strenuous activity for last such as sit-ups, heavy lifting, contact sports, etc  Refrain from any heavy lifting or straining until you are off narcotics for pain control.   DO NOT PUSH THROUGH PAIN.  Let pain be your guide: If it hurts to do something, don't do it.  Pain is your body warning you to avoid that activity for another week until the pain goes down. You may drive when you are no longer taking prescription pain medication, you can comfortably wear a seatbelt, and you can safely maneuver your car and apply brakes. You may have sexual intercourse when it is comfortable.   FOLLOW UP in our office Please call [facility] at [phone] to set up an appointment to see your surgeon in the office for a follow-up appointment approximately 2-3 weeks after your surgery. Make sure that you call for this appointment the day you arrive home to insure a convenient appointment time.  9.  If you have disability of FMLA / Family leave forms, please bring the forms to the office for processing.  (do not give to your surgeon).  WHEN TO CALL us [phone]: Poor pain control Reactions / problems with new medications (rash/itching, nausea, etc)  Fever over 101.5 F (38.5 C) Inability to urinate Nausea and/or vomiting Worsening swelling or bruising Continued bleeding from incision. Increased pain, redness, or drainage from the incision   The clinic staff is available to answer your questions during regular business  hours (8:30am-5pm).  Please dont hesitate to call and ask to speak to one of our nurses for clinical concerns.   If you have a medical emergency, go to the nearest emergency room or call 911.  A surgeon from [facility] is always on call at the hospitals in [facility], [place], [place], [place], [place]  [address] ?  [address] [address], [place], [place]   [address] MAIN: [id number] ? TOLL FREE: [phone] ? FAX: [phone] [email or web address]  ##############################  GETTING TO GOOD BOWEL HEALTH.  ######################################################################  EAT Gradually transition to a high fiber diet with a fiber supplement over the next few weeks after discharge.  Start with a pureed / full liquid diet (see below)  WALK Walk an hour a day.  Control your pain to do that.    HAVE A BOWEL MOVEMENT DAILY Keep your bowels regular to avoid problems.  OK to try a laxative to override constipation.  OK to use an antidairrheal to slow down diarrhea.  Call if not better after 2 tries  CALL IF YOU HAVE PROBLEMS/CONCERNS Call if you are still struggling despite following these instructions. Call if you have concerns  not answered by these instructions  ######################################################################   Irregular bowel habits such as constipation and diarrhea can lead to many problems over time.  Having one soft bowel movement a day is the most important way to prevent further problems.  The anorectal canal is designed to handle stretching and feces to safely manage our ability to get rid of solid waste (feces, poop, stool) out of our body.  BUT, hard constipated stools can act like ripping concrete bricks and diarrhea can be a burning fire to this very sensitive area of our body, causing inflamed hemorrhoids, anal fissures, increasing risk is perirectal abscesses, abdominal  pain/bloating, an making irritable bowel worse.      The goal: ONE SOFT BOWEL MOVEMENT A DAY!  To have soft, regular bowel movements:  Drink plenty of fluids, consider 4-6 tall glasses of water a day.   Take plenty of fiber.  Fiber is the undigested part of plant food that passes into the colon, acting s natures broom to encourage bowel motility and movement.  Fiber can absorb and hold large amounts of water. This results in a larger, bulkier stool, which is soft and easier to pass. Work gradually over several weeks up to 6 servings a day of fiber (25g a day even more if needed) in the form of: Vegetables -- Root (potatoes, carrots, turnips), leafy green (lettuce, salad greens, celery, spinach), or cooked high residue (cabbage, broccoli, etc) Fruit -- Fresh (unpeeled skin & pulp), Dried (prunes, apricots, cherries, etc ),  or stewed ( applesauce)  Whole grain breads, pasta, etc (whole wheat)  Bran cereals  Bulking Agents -- This type of water-retaining fiber generally is easily obtained each day by one of the following:  Psyllium bran -- The psyllium plant is remarkable because its ground seeds can retain so much water. This product is available as Metamucil, Konsyl, Effersyllium, Per Diem Fiber, or the less expensive generic preparation in drug and health food stores. Although labeled a laxative, it really is not a laxative.  Methylcellulose -- This is another fiber derived from wood which also retains water. It is available as Citrucel. Polyethylene Glycol - and artificial fiber commonly called Miralax or Glycolax.  It is helpful for people with gassy or bloated feelings with regular fiber Flax Seed - a less gassy fiber than psyllium No reading or other relaxing activity while on the toilet. If bowel movements take longer than 5 minutes, you are too constipated AVOID CONSTIPATION.  High fiber and water intake usually takes care of this.  Sometimes a laxative is needed to stimulate more frequent  bowel movements, but  Laxatives are not a good long-term solution as it can wear the colon out.  They can help jump-start bowels if constipated, but should be relied on constantly without discussing with your doctor Osmotics (Milk of Magnesia, Fleets phosphosoda, Magnesium citrate, MiraLax, GoLytely) are safer than  Stimulants (Senokot, Castor Oil, Dulcolax, Ex Lax)    Avoid taking laxatives for more than 7 days in a row.  IF SEVERELY CONSTIPATED, try a Bowel Retraining Program: Do not use laxatives.  Eat a diet high in roughage, such as bran cereals and leafy vegetables.  Drink six (6) ounces of prune or apricot juice each morning.  Eat two (2) large servings of stewed fruit each day.  Take one (1) heaping tablespoon of a psyllium-based bulking agent twice a day. Use sugar-free sweetener when possible to avoid excessive calories.  Eat a normal breakfast.  Set aside 15 minutes after  breakfast to sit on the toilet, but do not strain to have a bowel movement.  If you do not have a bowel movement by the third day, use an enema and repeat the above steps.   CONTROLLING DIARRHEA  TAKE A FIBER SUPPLEMENT (FiberCon or Benefiner soluble fiber) twice a day - to thicken stools by absorbing excess fluid and retrain the intestines to act more normally.  Slowly increase the dose over a few weeks.  Too much fiber too soon can backfire and cause cramping & bloating.  TAKE AN IRON SUPPLEMENT twice a day to naturally constipate your bowels.  Usually ferrous sulfate 325mg  twice a day)  TAKE ANTI-DIARRHEAL MEDICINES: Loperamide (Imodium) can slow down diarrhea.  Start with two tablets (= 4mg ) first and then try one tablet every 6 hours.  Can go up to 2 pills four times day (8 pills of 2mg  max) Avoid if you are having fevers or severe pain.  If you are not better or start feeling worse, stop all medicines and call your doctor for advice LoMotil (Diphenoxylate / Atropine) is another medicine that can constipate &  slow down bowel moevements Pepto Bismol (bismuth) can gently thicken bowels as well  If diarrhea is worse,: drink plenty of liquids and try simpler foods for a few days to avoid stressing your intestines further. Avoid dairy products (especially milk & ice cream) for a short time.  The intestines often can lose the ability to digest lactose when stressed. Avoid foods that cause gassiness or bloating.  Typical foods include beans and other legumes, cabbage, broccoli, and dairy foods.  Every person has some sensitivity to other foods, so listen to our body and avoid those foods that trigger problems for you.Call your doctor if you are getting worse or not better.  Sometimes further testing (cultures, endoscopy, X-ray studies, bloodwork, etc) may be needed to help diagnose and treat the cause of the diarrhea. Take extra anti-diarrheal medicines (maximum is 8 pills of 2mg  loperamide a day)  TROUBLESHOOTING IRREGULAR BOWELS 1) Avoid extremes of bowel movements (no bad constipation/diarrhea) 2) Miralax 17gm mixed in 8oz. water or juice-daily. May use BID as needed.  3) Gas-x,Phazyme, etc. as needed for gas & bloating.  4) Soft,bland diet. No spicy,greasy,fried foods.  5) Prilosec over-the-counter as needed  6) May hold gluten/wheat products from diet to see if symptoms improve.  7)  May try probiotics (Align, Activa, etc) to help calm the bowels down 7) If symptoms become worse call back immediately.   ##############################################  Pelvic floor muscle training exercises ("Kegels") can help strengthen the muscles under the uterus, bladder, and bowel (large intestine). They can help both men and women who have problems with urine leakage or bowel control.  A pelvic floor muscle training exercise is like pretending that you have to urinate, and then holding it. You relax and tighten the muscles that control urine flow. It's important to find the right muscles to tighten.  The next  time you have to urinate, start to go and then stop. Feel the muscles in your vagina, bladder, or anus get tight and move up. These are the pelvic floor muscles. If you feel them tighten, you've done the exercise right. If you are still not sure whether you are tightening the right muscles, keep in mind that all of the muscles of the pelvic floor relax and contract at the same time. Because these muscles control the bladder, rectum, and vagina, the following tips may help: Women: Insert a  finger into your vagina. Tighten the muscles as if you are holding in your urine, then let go. You should feel the muscles tighten and move up and down.  Men: Insert a finger into your rectum. Tighten the muscles as if you are holding in your urine, then let go. You should feel the muscles tighten and move up and down. These are the same muscles you would tighten if you were trying to prevent yourself from passing gas.  It is very important that you keep the following muscles relaxed while doing pelvic floor muscle training exercises: Abdominal  Buttocks (the deeper, anal sphincter muscle should contract)  Thigh   A woman can also strengthen these muscles by using a vaginal cone, which is a weighted device that is inserted into the vagina. Then you try to tighten the pelvic floor muscles to hold the device in place. If you are unsure whether you are doing the pelvic floor muscle training correctly, you can use biofeedback and electrical stimulation to help find the correct muscle group to work. Biofeedback is a method of positive reinforcement. Electrodes are placed on the abdomen and along the anal area. Some therapists place a sensor in the vagina in women or anus in men to monitor the contraction of pelvic floor muscles.  A monitor will display a graph showing which muscles are contracting and which are at rest. The therapist can help find the right muscles for performing pelvic floor muscle training exercises.    PERFORMING PELVIC FLOOR EXERCISES: 1. Begin by emptying your bladder. 2. Tighten the pelvic floor muscles and hold for a count of 10. 3. Relax the muscles completely for a count of 10. 4. Do 10 repititions, 3 to 5 times a day (morning, afternoon, and night). You can do these exercises at any time and any place. Most people prefer to do the exercises while lying down or sitting in a chair. After 4 - 6 weeks, most people notice some improvement. It may take as long as 3 months to see a major change. After a couple of weeks, you can also try doing a single pelvic floor contraction at times when you are likely to leak (for example, while getting out of a chair). A word of caution: Some people feel that they can speed up the progress by increasing the number of repetitions and the frequency of exercises. However, over-exercising can instead cause muscle fatigue and increase urine leakage. If you feel any discomfort in your abdomen or back while doing these exercises, you are probably doing them wrong. Breathe deeply and relax your body when you are doing these exercises. Make sure you are not tightening your stomach, thigh, buttock, or chest muscles. When done the right way, pelvic floor muscle exercises have been shown to be very effective at improving urinary continence.  Pelvic Floor Pain / Incontinence  Do you suffer from pelvic pain or incontinence? Do you have pain in the pelvis, low back or hips that is associated with sitting, walking, urination or intercourse? Have you experienced leaking of urine or feces when coughing, sneezing or laughing? Do you have pain in the pelvic area associated with cancer?  These are conditions that are common with pelvic floor muscle dysfunction. Over time, due to stress, scar tissue, surgeries and the natural course of aging, our muscles may become weak or overstressed and can spasm. This can lead to pain, weakness, incontinence or decreased quality of  life.  Men and women with pelvic floor dysfunction  frequently describe:  A falling out feeling. Pain or burning in the abdomen, tailbone or perineal area. Constipation or bowel elimination problems or difficulty initiating urination. Unresolved low back or hip pain. Frequency and urgency when going to the bathroom. Leaking of urine or feces. Pain with intercourse.  [email or web address]  To make a referral or for more information about [facility] Pelvic Floor Therapy Program, call  [facility]) - [place] ([place]) - [facility]) - [place] Instructions  Activity: Get plenty of rest for the remainder of the day. A responsible individual must stay with you for 24 hours following the procedure.  For the next 24 hours, DO NOT: -Drive a car -Paediatric nurse -Drink alcoholic beverages -Take any medication unless instructed by your physician -Make any legal decisions or sign important papers.  Meals: Start with liquid foods such as gelatin or soup. Progress to regular foods as tolerated. Avoid greasy, spicy, heavy foods. If nausea and/or vomiting occur, drink only clear liquids until the nausea and/or vomiting subsides. Call your physician if vomiting continues.  Special Instructions/Symptoms: Your throat may feel dry or sore from the anesthesia or the breathing tube placed in your throat during surgery. If this causes discomfort, gargle with warm salt water. The discomfort should disappear within 24 hours.  If you had a scopolamine patch placed behind your ear for the management of post- operative nausea and/or vomiting:  1. The medication in the patch is effective for 72 hours, after which it should be removed.  Wrap patch in a tissue and discard in the trash. Wash  hands thoroughly with soap and water. 2. You may remove the patch earlier than 72 hours if you experience unpleasant side effects which may include dry mouth, dizziness or visual disturbances. 3. Avoid touching the patch. Wash your hands with soap and water after contact with the patch.    Information for Discharge Teaching: EXPAREL (bupivacaine liposome injectable suspension)   Your surgeon or anesthesiologist gave you EXPAREL(bupivacaine) to help control your pain after surgery.  EXPAREL is a local anesthetic that provides pain relief by numbing the tissue around the surgical site. EXPAREL is designed to release pain medication over time and can control pain for up to 72 hours. Depending on how you respond to EXPAREL, you may require less pain medication during your recovery.  Possible side effects: Temporary loss of sensation or ability to move in the area where bupivacaine was injected. Nausea, vomiting, constipation Rarely, numbness and tingling in your mouth or lips, lightheadedness, or anxiety may occur. Call your doctor right away if you think you may be experiencing any of these sensations, or if you have other questions regarding possible side effects.  Follow all other discharge instructions given to you by your surgeon or nurse. Eat a healthy diet and drink plenty of water or other fluids.  If you return to the hospital for any reason within 96 hours following the administration of EXPAREL, it is important for health care providers to know that you have received this anesthetic. A teal colored band has been placed on your arm with the date, time and amount of EXPAREL you have received in order to alert and inform your health care providers. Please leave this armband in place for the full 96 hours following administration, and then you may remove the band.    D not take any nonsteroidal ant inflammatories until after 3:30 pm today.

## 2021-04-10 NOTE — Interval H&P Note (Signed)
History and Physical Interval Note:  04/10/2021 7:17 AM  [name]  has presented today for surgery, with the diagnosis of SPIGELIAN HERNIA.  The various methods of treatment have been discussed with the patient and family. After consideration of risks, benefits and other options for treatment, the patient has consented to  Procedure(s): LAPAROSCOPIC LEFT INGUINAL [place] (Left) as a surgical intervention.  The patient's history has been reviewed, patient examined, no change in status, stable for surgery.  I have reviewed the patient's chart and labs.  Questions were answered to the patient's satisfaction.    Adin Hector, MD, FACS, MASCRS Esophageal, Gastrointestinal & Colorectal Surgery Robotic and Minimally Invasive Surgery  [facility], [place]  [place] [place], [place] [address] [phone] Fax [phone] Main  CONTACT INFORMATION:  Weekday (9AM-5PM): Call [facility] main office at [phone]  Weeknight (5PM-9AM) or Weekend/Holiday: Check [email or web address] (password " [email or web address]") for General Surgery [facility] coverage  (Please, do not use SecureChat as it is not reliable communication to operating surgeons for immediate patient care)      Adin Hector

## 2021-04-10 NOTE — Op Note (Signed)
04/10/2021  9:39 AM  PATIENT:  [name]  70 y.o. female  Patient Care Team: Unk [name], MD as PCP - General (Internal Medicine) Josue Hector, MD as PCP - Cardiology (Cardiology) Donato Heinz, MD as Consulting Physician (Nephrology) Melissa Noon, [place] as Referring Physician (Optometry) Ladene Artist, MD as Consulting Physician (Gastroenterology) Josue Hector, MD as Consulting Physician (Cardiology) Rush Landmark, [facility] as Pharmacist (Pharmacist) Rockey Situ (Gynecology)  PRE-OPERATIVE DIAGNOSIS:  LEFT LOWER QUADRANT HERNIA ? SPIGELIAN HERNIA  POST-OPERATIVE DIAGNOSIS:  BILATERAL INGUINAL HERNIAS  PROCEDURE:   LAPAROSCOPIC BILATERAL INGUINAL HERNIA REPAIRS WITH MESH TRANSVERSUS ABDOMINIS PLANE (TAP) BLOCK - BILATERAL  SURGEON:  Adin Hector, MD  ASSISTANT: None  ANESTHESIA:     Regional ilioinguinal and genitofemoral and spermatic cord nerve blocks  General  Regional TRANSVERSUS ABDOMINIS PLANE (TAP) nerve block for perioperative & postoperative pain control provided with liposomal bupivacaine (Experel) mixed with 0.25% bupivacaine as a Bilateral TAP block x 4mL each side at the level of the transverse abdominis & preperitoneal spaces along the flank at the anterior axillary line, from subcostal ridge to iliac crest under laparoscopic guidance    EBL:  Total I/O In: 159.5 [IV Piggyback:159.5] Out: - .  See anesthesia record  Delay start of Pharmacological VTE agent (>24hrs) due to surgical blood loss or risk of bleeding:  no  DRAINS: NONE  SPECIMEN:  NONE  DISPOSITION OF SPECIMEN:  N/A  COUNTS:  YES  PLAN OF CARE: Discharge to home after PACU  PATIENT DISPOSITION:  PACU - hemodynamically stable.  INDICATION: Active woman with left lower quadrant bulging near the groin concerning for spigelian versus inguinal hernia.  Possible contralateral hernia.  I offered diagnostic laparoscopy and repair of hernias found  The anatomy &  physiology of the abdominal wall and pelvic floor was discussed.  The pathophysiology of hernias in the inguinal and pelvic region was discussed.  Natural history risks such as progressive enlargement, pain, incarceration & strangulation was discussed.   Contributors to complications such as smoking, obesity, diabetes, prior surgery, etc were discussed.    I feel the risks of no intervention will lead to serious problems that outweigh the operative risks; therefore, I recommended surgery to reduce and repair the hernia.  I explained laparoscopic techniques with possible need for an open approach.  I noted usual use of mesh to patch and/or buttress hernia repair  Risks such as bleeding, infection, abscess, need for further treatment, heart attack, death, and other risks were discussed.  I noted a good likelihood this will help address the problem.   Goals of post-operative recovery were discussed as well.  Possibility that this will not correct all symptoms was explained.  I stressed the importance of low-impact activity, aggressive pain control, avoiding constipation, & not pushing through pain to minimize risk of post-operative chronic pain or injury. Possibility of reherniation was discussed.  We will work to minimize complications.     An educational handout further explaining the pathology & treatment options was given as well.  Questions were answered.  The patient expresses understanding & wishes to proceed with surgery.  OR FINDINGS: Indirect inguinal hernia of moderate size on the left side.  No direct space, femoral, obturator hernias.  On the right very small but definite direct space hernia and mild indirect inguinal hernia for a small pantaloon type inguinal hernia.  No femoral nor obturator hernias.  No evidence of any spigelian or periumbilical hernias  DESCRIPTION:  The patient was  identified & brought into the operating room. The patient was positioned supine with arms tucked. SCDs  were active during the entire case. The patient underwent general anesthesia without any difficulty.  The abdomen was prepped and draped in a sterile fashion. The patient's bladder was emptied.  A Surgical Timeout confirmed our plan.  I made a transverse incision through the inferior umbilical fold.  I made a small transverse nick through the anterior rectus fascia contralateral to the inguinal hernia side and placed a 0-vicryl stitch through the fascia.  I placed a Hasson trocar into the preperitoneal plane.  Entry was clean.  We induced carbon dioxide insufflation. Camera inspection revealed no injury.  I used a 17mm angled scope to bluntly free the peritoneum off the infraumbilical anterior abdominal wall.  I created enough of a preperitoneal pocket to place 36mm ports into the right & left mid-abdomen into this preperitoneal cavity.  I focused attention on the LEFT pelvis since that was the dominant hernia side.   I used blunt & focused sharp dissection to free the peritoneum off the flank and down to the pubic rim.  I freed the anteriolateral bladder wall off the anteriolateral pelvic wall, sparing midline attachments.   I located a swath of peritoneum going into a hernia fascial defect at the  internal ring consistent with  an indirect inguinal hernia..  I gradually freed the peritoneal hernia sac off safely and reduced it into the preperitoneal space.  I freed the peritoneum off the round ligament.  Unfortunately it was extremely atrophic and came off quite easily.  I assured hemostasis.  I freed the peritoneum off the retroperitoneum and in her pelvis.   Spermatic cord lipoma was dissected away & removed.  I checked & assured hemostasis.  Given her extremely fragile tissues and incarcerated hernia sac, I did end up having to do did lateral and hernia sac peritoneal repairs laparoscopically with 2-0 Vicryl intracorporeal suturing  I turned attention on the opposite  RIGHT pelvis.  I did dissection in a  similar, mirror-image fashion. The patient had  small direct space and indirect pantaloon type hernias.  Peritoneum freed off the right round ligament. Marland KitchenSpermatic cord lipoma was dissected away & removed.    I checked & assured hemostasis.     I chose sheets of lightweight polypropylene Bard Soft Mesh 15x15cm, one for each side.  I cut a single sigmoid-shaped slit ~6cm from a corner of each mesh.  I placed the meshes into the preperitoneal space & laid them as overlapping diamonds such that at the inferior points, a 6x6 cm corner flap rested in the true anterolateral pelvis, covering the obturator & femoral foramina.   I allowed the bladder to return to the pubis, this helping tuck the corners of the mesh in the anteriolateral pelvis.  The medial corners overlapped each other across midline cephalad to the pubic rim.   Given the numerous hernias of moderate size, I placed a third 15x15cm mesh in the center as a vertical diamond.  The lateral wings of the mesh overlap across the direct spaces and internal rings where the dominant hernias were.  This provided good coverage and reinforcement of the hernia repairs.  Because of the central mesh placement with good overlap, I did not place any tacks.   I held the hernia sacs cephalad & evacuated carbon dioxide.  I closed the fascia with absorbable suture.  I closed the skin using 4-0 monocryl stitch.  Sterile dressings were applied.  The patient was extubated & arrived in the PACU in stable condition..  I had discussed postoperative care with the patient in the holding area.  Instructions are written in the chart.  I discussed operative findings, updated the patient's status, discussed probable steps to recovery, and gave postoperative recommendations to the  patient's daughter, Lynett Fish. .  Recommendations were made.  Questions were answered.  She expressed understanding & appreciation.   Adin Hector, M.D., F.A.C.S. Gastrointestinal and Minimally  Invasive Surgery [facility] [address], [place], [place] [address] [phone] Main / Paging  04/10/2021 9:39 AM

## 2021-04-10 NOTE — Anesthesia Postprocedure Evaluation (Signed)
Anesthesia Post Note  Patient: [name]  Procedure(s) Performed: LAPAROSCOPIC LEFT INGUINAL HERNIA REPAIR AND RIGHT INGUINAL HERNII REPAIR WITH TAP BLOCK (Groin)     Patient location during evaluation: PACU Anesthesia Type: General Level of consciousness: awake and alert Pain management: pain level controlled Vital Signs Assessment: post-procedure vital signs reviewed and stable Respiratory status: spontaneous breathing, nonlabored ventilation and respiratory function stable Cardiovascular status: blood pressure returned to baseline and stable Postop Assessment: no apparent nausea or vomiting Anesthetic complications: no   No notable events documented.  Last Vitals:  Vitals:   04/10/21 1000 04/10/21 1013  BP: 132/82 108/74  Pulse: 78 75  Resp: 19 19  Temp:    SpO2: 100% 96%    Last Pain:  Vitals:   04/10/21 0959  TempSrc:   PainSc: [place]

## 2021-04-10 NOTE — H&P (Signed)
04/10/2021     REFERRING PHYSICIAN: Stechschulte, Malva Limes*  Patient Care Team: Alesia Richards, MD as PCP - General (Internal Medicine)  PROVIDER: Hollace Kinnier, MD  [facility] MRN: [id number] DOB: [birth year]   Subjective   Chief Complaint: Follow-up   History of Present Illness: [name] is a 70 y.o. female who is seen today as an office consultation at the request of Dr. Thermon Leyland for evaluation of Follow-up .   Patient here for combined opinion given concerns about incisional hernia and rectal prolapse.  Patient has seen numerous specialists in [facility] as well as [facility]. Former Marine scientist. Sounds like she struggles with irritable bowel syndrome with constipation and diarrhea. Had colonoscopy earlier in the year and that was rather underwhelming. Recommendation made by Dr. Fuller Plan to stay on MiraLAX and use hyoscyamine intermittently to help. Patient notes that that seemed to help but due to cost issues she had stopped taking high-dose amine. She does not take MiraLAX every day. She notes that she of a few days of constipation and then have up to 6 bowel movements a day. No major bleeding. She notes that she will get leaking of stool incontinence when she has diarrhea days. She does not recall anything popping out necessarily. Some occasional blood. Sometimes has to wear a pad or a lot of toilet paper to hold things in. She recalls having hemorrhoid surgery many decades ago. She feels like she has some incontinence. She did have vaginal deliveries and thinks she had an episiotomy.  She has some urinary incontinence issues and urinary tract infections. She recalls being told she has interstitial cystitis. She did see urology at [facility]. Referred to urogynecology. Looks like she had some stress urinary incontinence. Options were discussed including possible sling. Pessary for vagina. Pelvic floor physical therapy. She has not followed through that.  She  was sent to our group. Dr. Thermon Leyland he saw her because she noticed some swelling in her left lower abdomen and CT scan showed a hernia probable Spigelian hernia containing some small bowel. Dr. Thermon Leyland he wondered if this may be was a high inguinal herniating up into the abdominal wall as opposed to the groin. Patient wonders if the small bowel has triggered some of her irregular bowels although she has had irregular bowels for most of her life. She had a vaginal hysterectomy but I do not believe any other intra-abdominal surgeries. She was hoping to have bladder rectal and hernia surgery all at once. [facility] since she wanted surgery in [place] and not go to [facility].  She comes in today by herself. She has not seen urology. She has not had a urinary tract infection in a while although she is concerned because she had an elevated lactate and low blood pressure and wondered if that was a sign that she was getting septic from 1. Does not look she has been admitted for this. She claims she has COPD but is not on oxygen and uses her inhalers only once or twice a year. Does not need oxygen at night. She does not smoke. No diabetes. No cardiac or pulmonary issues. She did have a normal echocardiogram last year and an underwhelming Myoview stress test a couple years ago. Not been admitted.  Ready for surgery    Medical History: Past Medical History:  Diagnosis Date   Aneurysm (CMS-HCC)   Anxiety   Arrhythmia   Chronic kidney disease   COPD (chronic obstructive  pulmonary disease) (CMS-HCC)   GERD (gastroesophageal reflux disease)   History of stroke   Hyperlipidemia   Hypertension   Sleep apnea   Thyroid disease   There is no problem list on file for this patient.  Past Surgical History:  Procedure Laterality Date   HYSTERECTOMY    Allergies  Allergen Reactions   Ace Inhibitors Other (See Comments)  Acute renal failure (lisinopril) Acute renal  failure (lisinopril)   Cephalosporins Anaphylaxis   Hydromorphone Hcl Anaphylaxis   Methocarbamol Anaphylaxis   Sulfa (Sulfonamide Antibiotics) Other (See Comments)  Kidney doctor prefers she not take this Do not take per nephrology   Nitrofurantoin Other (See Comments)  Causes hyperthermia Causes hyperthermia   Gabapentin Unknown  Confusion   Codeine Nausea   Latex Rash   Current Outpatient Medications on File Prior to Visit  Medication Sig Dispense Refill   albuterol 90 mcg/actuation inhaler Inhale into the lungs   ascorbic acid, vitamin C, (VITAMIN C) 250 MG tablet Take by mouth   aspirin 81 MG EC tablet Take 81 mg by mouth once daily   atenoloL (TENORMIN) 100 MG tablet Take 1 tablet by mouth once daily   buPROPion (WELLBUTRIN XL) 150 MG XL tablet TAKE 3 TABLETS BY MOUTH EVERY MORNING FOR MOOD AND DEPRESSION   cholecalciferol, vitamin D3, (VITAMIN D3) 125 mcg (5,000 unit) tablet Take by mouth   docusate (COLACE) 100 MG capsule Take by mouth   famotidine (PEPCID) 20 MG tablet TAKE 1 TABLET BY MOUTH TWICE DAILY FOR INDIGESTION AND HEARTBURN   hyoscyamine (LEVSIN/SL) 0.125 mg SL tablet Dissolve 1 tablet under tongue every 4 hours if Needed for Nausea, Bloating, Cramping or Diarrhea   ipratropium-albuteroL (DUO-NEB) nebulizer solution USE 1 VIAL IN NEBULIZER EVERY 4 HOURS AS NEEDED **MAX 6 DOSES A DAY**   levothyroxine (SYNTHROID) 125 MCG tablet Take 1 tablet by mouth daily on an empty stomach with only water for 30 minutes & no antacid meds, calcium or magnesium for 4 hours & avoid biotin   promethazine (PHENERGAN) 25 MG tablet Take 25 mg by mouth every 6 (six) hours as needed   promethazine-dextromethorphan (PROMETHAZINE-DM) 6.25-15 mg/5 mL syrup Take by mouth   sertraline (ZOLOFT) 100 MG tablet Take 1 tablet by mouth daily for mood   No current facility-administered medications on file prior to visit.   Family History  Problem Relation Age of Onset   Obesity Mother   High  blood pressure (Hypertension) Sister    Social History   Tobacco Use  Smoking Status Never Smoker  Smokeless Tobacco Never Used    Social History   Socioeconomic History   Marital status: Married  Tobacco Use   Smoking status: Never Smoker   Smokeless tobacco: Never Used  Scientific laboratory technician Use: Never used  Substance and Sexual Activity   Alcohol use: Yes   Drug use: Never   ############################################################  Fraility Risk:  Preoperative Risks/Screening 1. Frailty Review:  Lives independently? yes Uses a mobility assist device (cane/walker/wheelchair)? no History of falls within 3 months? no Cognitive impairment/dementia? no Age > 65? yes  2. Nutrition Screening: Cancer/IBD? no Age >65? yes Weight loss >10% in past 6 months? no If yes to any of the 3 above, consider Impact AR supplemental shake  3. PONV Screening: History of PONV? no Female under age of 35? no History of motion sickness? no  4. Chronic pain issues? no  5. Diabetes? no Last HgbA1c: No results found for: HGBA1C  Review of  Systems: A complete review of systems (ROS) was obtained from the patient. I have reviewed this information and discussed as appropriate with the patient. See HPI as well for other pertinent ROS.  Constitutional: No fevers, chills, sweats. Weight stable Eyes: No vision changes, No discharge HENT: No sore throats, nasal drainage Lymph: No neck swelling, No bruising easily Pulmonary: No cough, productive sputum CV: No orthopnea, PND Patient walks 30 minutes without difficulty. No exertional chest/neck/shoulder/arm pain.  GI: No personal nor family history of GI/colon cancer, inflammatory bowel disease, irritable bowel syndrome, allergy such as Celiac Sprue, dietary/dairy problems, colitis, ulcers nor gastritis. No recent sick contacts/gastroenteritis. No travel outside the country. No changes in diet.  Renal: No UTIs, No hematuria Genital: No  drainage, bleeding, masses Musculoskeletal: No severe joint pain. Good ROM major joints Skin: No sores or lesions Heme/Lymph: No easy bleeding. No swollen lymph nodes  Objective:   BP 124/78    Pulse (!) 54    Temp 97.8 F (36.6 C) (Oral)    Resp 17    Ht 5\' 3"  (1.6 m)    Wt 76.5 kg    SpO2 96%    BMI 29.88 kg/m  04/10/2021 Body mass index is 29.88 kg/m.   PHYSICAL EXAM:  Constitutional: Not cachectic. Hygeine adequate. Vitals signs as above.  Eyes: Pupils reactive, normal extraocular movements. Sclera nonicteric Neuro: CN II-XII intact. No major focal sensory defects. No major motor deficits. Lymph: No head/neck/groin lymphadenopathy Psych: No severe agitation. No severe anxiety. Judgment & insight Adequate, Oriented x4, HENT: Normocephalic, Mucus membranes moist. No thrush.  Neck: Supple, No tracheal deviation. No obvious thyromegaly Chest: No pain to chest wall compression. Good respiratory excursion. No audible wheezing CV: Pulses intact. Regular rhythm. No major extremity edema  Abdomen: Flat  Hernia: Left lower quadrant mass consistent with hernia.  Reducible. A little low for Spigelian but a little high for an inguinal hernia..  Diastasis recti: Not present. Soft. Nondistended. Nontender. No hepatomegaly. No splenomegaly  Gen: Inguinal hernia: Mild sensitivity in groins but no definite inguinal hernias. See above. Inguinal lymph nodes: without lymphadenopathy.   Rectal: deferred  ##################################  Patient examined in decubitus position.  Perianal skin Clean with good hygiene  Pruritis ani: Not present Anal fissure: Not present Perirectal abscess/fistula Not present External hemorrhoids Present at: left lateral Pilonidal disease: Not present Condyloma / warts: Not present  Digital and anoscopic rectal exam tolerated  Sphincter tone Weak squeeze  Hemorrhoidal piles Grade 3 prolapsing at left lateral.  Right sided at least grade  2.  Redundant floppy rectum. Some mild internal telescoping but no true procidentia/rectal prolapse through anoscope or with straining.   Rectal masses: Not present   Exam done with assistance of female Medical Assistant in the room.   ###################################  Ext: No obvious deformity or contracture. Edema: Not present. No cyanosis Skin: No major subcutaneous nodules. Warm and dry Musculoskeletal: Severe joint rigidity not present. No obvious clubbing. No digital petechiae.   Labs, Imaging and Diagnostic Testing:  Located in [place]' section of Epic EMR chart  PRIOR NOTES   Located in [place]' section of Epic EMR chart  SURGERY NOTES:  Not applicable  PATHOLOGY:  Not applicable  Assessment and Plan:  DIAGNOSES:  Diagnoses and all orders for this visit:  Spigelian hernia  Irritable bowel syndrome with both constipation and diarrhea  Prolapsed internal hemorrhoids, grade 3  Incontinence of feces with fecal urgency - Ambulatory Referral to Physical Therapy - MRI pelvis with  and without contrast; Future  SUI (stress urinary incontinence, female)  Decreased anal sphincter tone    Narrative & Impression  CLINICAL DATA:  Fecal incontinence and urgency   EXAM: MRI PELVIS WITHOUT CONTRAST   TECHNIQUE: Multiplanar multisequence MR defecography protocol imaging of the pelvis was performed. No intravenous contrast was administered. Ultrasound gel was administered per rectum as MR contrast.   COMPARISON:  CT abdomen pelvis, 09/03/2020   FINDINGS: Urinary Tract:  No abnormality visualized.   Bowel:  Unremarkable visualized pelvic bowel loops.   Vascular/Lymphatic: No pathologically enlarged lymph nodes. No significant vascular abnormality seen.   Reproductive:  Status post hysterectomy.   Other: There is pelvic floor laxity on dynamic defecography imaging, with cystocele, moderate anterior rectocele (approximately 2.5  cm extension beyond the normal anterior position of the rectal wall), and enterocele upon defecation.   Musculoskeletal: No suspicious bone lesions identified.   IMPRESSION: 1. 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   2.  Status post hysterectomy.     Electronically Signed   By: Delanna Ahmadi M.D.   On: 03/26/2021 08:30     ASSESSMENT/PLAN  Woman with numerous issues.  Left lower quadrant probably low spigelian hernia containing small bowel that is reducible. I do think at some point she will need surgery to fix this. Reasonable laparoscopic exploration with probable preperitoneal TEP vs TAPP approach. Would recommend mesh repair since stitch only has a rather high failure rate. She was nervous about using mesh since she had read online about concerns. I noted I would do this electively and not the setting of an emergency. That would minimize risk. She did not seem to have any active MRSA or urinary tract infections. I definitely would not recommend mesh repair at the same time of any pelvic floor repair. She seemed interested in proceeding with that.  Fecal incontinence with decreased sphincter tone. I think this is more a side effect of her poorly controlled irritable bowel syndrome and diarrhea. I strongly recommend she stay on a fiber supplement every day to help thicken and soften her stools along with possible antidiarrheal/antispasmodic re-regulate her bowels. She is quite full of stool on her CAT scan, arguing against severe watery diarrhea issues. No obvious colitis or obstructing mass on colonoscopy earlier this year. I recommend she reconsider doing the high-dose amine since that seem to help regulate things and reach out to her gastroenterologist to find a bowel regiment that will get her bowels in a much normal normal range of every other day to 2 times a day. I noted until she gets her  bowels regulated, she will struggle with problems such as urge incontinence and hemorrhoid issues. Any repair I do will fail.  I do not think she has full circumferential rectal prolapse.  She does have decreased sphincter tone may be related to prior episiotomy. I offered her to see our pelvic floor physical therapist. She claims she saw someone for this and was underwhelmed. It was not in town here.  I tried to notice that her pelvic for physical therapist is well trained and has had gotten good results.  I gave her handout and encouraged her to reconsider.  Noted she should discuss with urology and actually make an appointment to talk to them about options. She had not done that since the consultation in August. She was hoping for a female urogynecologist. There is none in town. She will reconsider being urology. She  recalls being told it would be Dr. Louis Meckel.    Adin Hector, MD, FACS, MASCRS Esophageal, Gastrointestinal & Colorectal Surgery Robotic and Minimally Invasive Surgery  [facility] [place]  [place]. [address], [place], [place] [address] [phone] Fax [phone] Main  CONTACT INFORMATION:  Weekday (9AM-5PM): Call [facility] main office at [phone]  Weeknight (5PM-9AM) or Weekend/Holiday: Check [email or web address] (password " [email or web address]") for General Surgery [facility] coverage  (Please, do not use SecureChat as it is not reliable communication to operating surgeons for immediate patient care)     04/10/2021

## 2021-04-10 NOTE — Transfer of Care (Signed)
Immediate Anesthesia Transfer of Care Note  Patient: [name]  Procedure(s) Performed: Procedure(s) (LRB): LAPAROSCOPIC LEFT INGUINAL HERNIA REPAIR AND RIGHT INGUINAL HERNII REPAIR WITH TAP BLOCK (N/A)  Patient Location: PACU  Anesthesia Type: General  Level of Consciousness: awake, disoriented, sedated and patient cooperative  Airway & Oxygen Therapy: Patient Spontanous Breathing and Patient connected to face mask oxygen  Post-op Assessment: Report given to PACU RN and Post -op Vital signs reviewed and stable  Post vital signs: Reviewed and stable  Complications: No apparent anesthesia complications Last Vitals:  Vitals Value Taken Time  BP 142/94 04/10/21 0945  Temp    Pulse 80 04/10/21 0950  Resp 17 04/10/21 0950  SpO2 99 % 04/10/21 0950  Vitals shown include unvalidated device data.  Last Pain:  Vitals:   04/10/21 0626  TempSrc: Oral  PainSc: 0-No pain      Patients Stated Pain Goal: 4 (75/44/92 0100)  Complications: No notable events documented.

## 2021-04-10 NOTE — Progress Notes (Signed)
Patient was moved to phase II for discharge instructions and final vitals. Patient was given oxycodone 5mg  tablet for pain because she declined Fentanyl in PACU. RN verified patients allergies with patient and patient stated that she could not remember if she had ever had oxy, but was okay with taking it. After discharge instructions were given, patients vital signs were taken. Patients blood pressure was 89/55. RN assessed blood pressure again and it was 82/67. Patient was alert and oriented and was asymptomatic other than low blood pressure. Patient stated that her blood pressures run low after she is given medication. RN explained to patient that she could not be discharged with low blood pressure. RN notified Dr. Roger Kill and Dr. Roger Kill instructed to give patient remaining amount of IV fluid in bag and check again. Patient was given remaining amount and final blood pressures were 108/62 and 102/52.

## 2021-04-14 ENCOUNTER — Encounter (HOSPITAL_BASED_OUTPATIENT_CLINIC_OR_DEPARTMENT_OTHER): Payer: Self-pay | Admitting: Surgery

## 2021-04-15 ENCOUNTER — Ambulatory Visit: Payer: PPO | Admitting: Physical Therapy

## 2021-04-22 ENCOUNTER — Telehealth: Payer: Self-pay

## 2021-04-22 NOTE — Progress Notes (Signed)
Patients RPM readings for the month of December are as follows.    WEIGHT  Measurement Date: 04/22/2021 Tuesday at 09:32 AM Weight ( in lbs ): 168.7 Measurement Date: 04/21/2021 Monday at 08:35 AM Weight ( in lbs ): 169.5 Measurement Date: 04/20/2021 Sunday at 09:45 AM Weight ( in lbs ): 169.1 Measurement Date: 04/19/2021 Saturday at 02:43 PM Weight ( in lbs ): 169.5 Measurement Date: 04/18/2021 Friday at 08:44 AM Weight ( in lbs ): 170 Measurement Date: 04/17/2021 Thursday at 08:14 AM Weight ( in lbs ): 168.9 Measurement Date: 04/16/2021 Wednesday at 07:57 AM Weight ( in lbs ): 169.5 Measurement Date: 04/16/2021 Wednesday at 04:26 AM Weight ( in lbs ): 170.2 Measurement Date: 04/15/2021 Tuesday at 08:12 AM Weight ( in lbs ): 170 Measurement Date: 04/14/2021 Monday at 12:00 PM Weight ( in lbs ): 171.3 Measurement Date: 04/14/2021 Monday at 08:31 AM Weight ( in lbs ): 170.2 Measurement Date: 04/13/2021 Sunday at 08:08 AM Weight ( in lbs ): 170.4 Measurement Date: 04/12/2021 Saturday at 12:31 PM Weight ( in lbs ): 173.5 Measurement Date: 04/12/2021 Saturday at 09:02 AM Weight ( in lbs ): 172.6 Measurement Date: 04/11/2021 Friday at 01:12 PM Weight ( in lbs ): 174.4 Measurement Date: 04/11/2021 Friday at 10:40 AM Weight ( in lbs ): 173.9 Measurement Date: 04/10/2021 Thursday at 02:49 AM Weight ( in lbs ): 168.2 Measurement Date: 04/09/2021 Wednesday at 08:47 AM Weight ( in lbs ): 166.9 Measurement Date: 04/08/2021 Tuesday at 08:45 AM Weight ( in lbs ): 166.2 Measurement Date: 04/07/2021 Monday at 11:53 AM Weight ( in lbs ): 166.9 Measurement Date: 04/06/2021 Sunday at 08:53 AM Weight ( in lbs ): 165.1 Measurement Date: 04/05/2021 Saturday at 07:45 AM Weight ( in lbs ): 165.3 Measurement Date: 04/04/2021 Friday at 07:45 AM Weight ( in lbs ): 165.1 Measurement Date: 04/03/2021 Thursday at 09:30 AM Weight ( in lbs ): 166.7 Measurement Date: 04/02/2021 Wednesday at 07:51 AM Weight ( in lbs ):  164.2 Measurement Date: 04/01/2021 Tuesday at 08:14 AM Weight ( in lbs ): 164.2 Measurement Date: 03/31/2021 Monday at 08:01 AM Weight ( in lbs ): 164.5 Measurement Date: 03/30/2021 Sunday at 09:30 AM Weight ( in lbs ): 165.1 Measurement Date: 03/29/2021 Saturday at 09:55 AM Weight ( in lbs ): 165.3 Measurement Date: 03/28/2021 Friday at 11:25 AM Weight ( in lbs ): 165.3 Measurement Date: 03/27/2021 Thursday at 07:04 AM Weight ( in lbs ): [place]

## 2021-04-24 DIAGNOSIS — I10 Essential (primary) hypertension: Secondary | ICD-10-CM | POA: Diagnosis not present

## 2021-04-24 DIAGNOSIS — E039 Hypothyroidism, unspecified: Secondary | ICD-10-CM | POA: Diagnosis not present

## 2021-04-24 DIAGNOSIS — I5032 Chronic diastolic (congestive) heart failure: Secondary | ICD-10-CM | POA: Diagnosis not present

## 2021-04-29 ENCOUNTER — Ambulatory Visit: Payer: PPO | Admitting: Pharmacist

## 2021-04-29 ENCOUNTER — Other Ambulatory Visit: Payer: Self-pay

## 2021-04-29 VITALS — BP 113/76 | HR 57

## 2021-04-29 DIAGNOSIS — E782 Mixed hyperlipidemia: Secondary | ICD-10-CM

## 2021-04-29 DIAGNOSIS — Z79899 Other long term (current) drug therapy: Secondary | ICD-10-CM

## 2021-04-29 DIAGNOSIS — R7309 Other abnormal glucose: Secondary | ICD-10-CM

## 2021-04-29 DIAGNOSIS — I10 Essential (primary) hypertension: Secondary | ICD-10-CM

## 2021-04-29 DIAGNOSIS — E039 Hypothyroidism, unspecified: Secondary | ICD-10-CM

## 2021-04-29 DIAGNOSIS — I5032 Chronic diastolic (congestive) heart failure: Secondary | ICD-10-CM

## 2021-04-29 DIAGNOSIS — N1831 Chronic kidney disease, stage 3a: Secondary | ICD-10-CM

## 2021-05-02 NOTE — Progress Notes (Signed)
Pharmacist Visit  SHAKYRA, MATTERA [id number] 71 years, Female  DOB: [birth year]  M: [phone] Care Team: Rhys Martini, Aloysious Vangieson __________________________________________________ Clinical Summary Situation:: Dyane Broberg is a 71 year old female who presents via phone for CCM follow-up visit. Pt is doing fine today. Pt is s/p hernia repair surgery 04/10/2021 and denies any pain or complications at this time. Background:: Per last CCM visit, pt was doing well and most chronic conditions were controlled except for LDL, anxiety, TSH, and COPD. She successfully tapered off of Protonix and is now taking Famotidine 70m BID. Pt was counseled extensively on diet, COPD, and anxiety symptoms, thyroid medication was adjusted. Pt weight's fluctuated in past 3 days from 166.4 lbs --&gt; 168 --&gt; 169.1. Pt denied swelling but endorses slight SOB. Pt states her diet hadn't been "the best" as she has been eating a lot of cornbread Assessment:: Last lipid panel in 11/2020, pt due for labs at next OV on 06/13/2020 Patient's anxiety is a lot better and pt has not needed to take medication, Recommend GAD7 assessment in 2-3 months TSH is now WNL Recommendations:: Continued to encourage patient on healthy diet changes and exercise habits to help with cholesterol Will continue monitoring daily weights, counseled pt to call office if gain &gt;3 pounds in 1 day or 5 pounds in 1 week.  Chronic Conditions Patient's Chronic Conditions: Hypertension (HTN), Osteopenia or Osteoporosis, Chronic Kidney Disease (CKD), Diastolic Heart Failure / HFpEF, Hypothyroidism, Depression, Hyperlipidemia/Dyslipidemia (HLD), Anxiety, Migraines, LBBB, Atherosclerosis of Aorta, IBS, Vitamin D deficiency, Emphysema    Doctor and Hospital Visits Were there PCP Visits since last visit with the Pharmacist?: Yes Visit #1: 02/06/2021- DMarta Lamas NP (PCP)-Patient presented for f/u for Dysuria, No medication changes noted. Were there  Specialist Visits since last visit with the Pharmacist?: Yes Visit #1: 01/23/2021- CDonato Heinz(Nephrology)- No other information available. Visit #2: 02/26/21- Dr, SMichael Boston(Esophageal, Gastrointestinal &amp; Colorectal Surgery Robotic and Minimally Invasive Surgery) Pt. presented for office consultation at the request of Dr. SThermon Leylandfor evaluation of Follow-up for combined opinion given concerns about incisional hernia and rectal prolapse. Dr. GJohney Mainestrongly recommend she stay on a fiber supplement every day to help thicken and soften her stool. No other medication changes noted,  Was there a Hospital Visit in last 30 days?: Yes Reason for admission: Surgery (Lap-left inguinal hernia repair and right inguinal hernia repair with TAP block. Admit Date: 04/10/2021 Discharge Date: 04/10/2021 [facility] Medications Started at hospital discharge (list medications started): clindamycin (CLEOCIN) 900 MG/50ML IVPB, acetaminophen (TYLENOL) 500 MG tablet, Tramadol 53m1-2 tablets PO Q6hrs PRN. Medication Changes at hospital discharge (list medications changed): Stopped- Lactated ringers infusion, Medline mouth rinse, Clindaamycin 90068m0ml IVPB,chlorhexidine (PERIDEX) 0.12 % solution 15 mL, bupivacaine liposome (EXPAREL) 1.3 % injection 266 mg, Chlorhexidine Gluconate Cloth 2 % PADS 6 each,fentaNYL (SUBLIMAZE) injection 25-50 mcg,ondansetron (ZOFRAN) injection 4 mg,amisulpride (BARHEMSYS) injection 10 mg, sodium chloride irrigation 0.9 %, bupivacaine-EPINEPHrine (MARCAINE W/ EPI) 0.25% -1:200000 (with pres) injection, acetaminophen (TYLENOL) tablet 1,000 mg Medications Discontinued at hospital discharge (list medications discontinued): Lactated ringers infusion, Medline mouth rinse, Clindaamycin 900m85mml IVPB,chlorhexidine (PERIDEX) 0.12 % solution 15 mL, bupivacaine liposome (EXPAREL) 1.3 % injection 266 mg, Chlorhexidine Gluconate Cloth 2 % PADS  6 each,fentaNYL (SUBLIMAZE) injection 25-50 mcg,ondansetron (ZOFRAN) injection 4 mg,amisulpride (BARHEMSYS) injection 10 mg, sodium chloride irrigation 0.9 %, bupivacaine-EPINEPHrine (MARCAINE W/ EPI) 0.25% -1:200000 (with pres) injection, acetaminophen (TYLENOL) tablet 1,000 mg Medications that remain the same after  Hospital Discharge (list medications continued): albuterol 90 mcg/actuation inhaler Inhale into the lungs  ascorbic acid, vitamin C, (VITAMIN C) 250 MG tablet Take by mouth  aspirin 81 MG EC tablet Take 81 mg by mouth once daily  atenoloL (TENORMIN) 100 MG tablet Take 1 tablet by mouth once daily  buPROPion (WELLBUTRIN XL) 150 MG XL tablet TAKE 3 TABLETS BY MOUTH EVERY MORNING FOR MOOD AND DEPRESSION  cholecalciferol, vitamin D3, (VITAMIN D3) 125 mcg (5,000 unit) tablet Take by mouth  docusate (COLACE) 100 MG capsule Take by mouth  famotidine (PEPCID) 20 MG tablet TAKE 1 TABLET BY MOUTH TWICE DAILY FOR INDIGESTION AND HEARTBURN  hyoscyamine (LEVSIN/SL) 0.125 mg SL tablet Dissolve 1 tablet under tongue every 4 hours if Needed for Nausea, Bloating, Cramping or Diarrhea  ipratropium-albuteroL (DUO-NEB) nebulizer solution USE 1 VIAL IN NEBULIZER EVERY 4 HOURS AS NEEDED **MAX 6 DOSES A DAY**  levothyroxine (SYNTHROID) 125 MCG tablet Take 1 tablet by mouth daily on an empty stomach with only water for 30 minutes &amp; no antacid meds, calcium or magnesium for 4 hours &amp; avoid biotin  promethazine (PHENERGAN) 25 MG tablet Take 25 mg by mouth every 6 (six) hours as needed  promethazine-dextromethorphan (PROMETHAZINE-DM) 6.25-15 mg/5 mL syrup Take by mouth  sertraline (ZOLOFT) 100 MG tablet Take 1 tablet by mouth daily for mood Were there other Hospital Visits since last visit with the Pharmacist?: No  Medication Information Have there been any medication changes from PCP or Specialist since last visit with the Pharmacist?: Yes Details: Started- Levisin 0.141m disoolve one tablet Q4hrs  PRN for nausea, bloating, cramping, diarrhea. Tramadol 547m1-2 tablets Q6hrs PRN Are there any Medication adherence gaps (beyond 5 days past due)?: Yes Details:  Atenolol 10043m daily- 11/14/20-05/04/222 (&gt;5 days gap) 90DS by WesValle Crucisdication adherence rates for the STAR rating drugs: Atenolol 100m86mdaily- 08/28/20 90DS List Patient's current Care Gaps: No current Care Gaps identified  Disease Assessments Current BP: 110/60 Current HR: 71 taken on: 02/05/2021 Weight: 164 BMI: 29.19 Last GFR: 53 taken on: 04/07/2021 Why did the patient present?: CCM follow up visit What is the patient's sleep pattern?: Trouble staying asleep, Other (with free form text) Details: Sleeps 3 hours and wakes up to urinate. Then she usually falls back a sleep and wakes up every 2 hours How many hours per night does patient typically sleep?: 5-6 hours Is Patient using [facility] pharmacy?: No [facility]: [facility] Are meds synced by current pharmacy?: No Are meds delivered by current pharmacy?: No - delivery not available Would patient benefit from direct intervention of clinical lead in dispensing process to optimize clinical outcomes?: Yes Are [facility] pharmacy services available where patient lives?: Yes Is patient disadvantaged to use [facility]?: Yes Does patient experience delays in picking up medications due to transportation concerns (getting to pharmacy)?: No.  Hypertension (HTN) Assess this condition today?: Yes Is patient able to obtain BP reading today?: Yes BP today is: 113/76 Heart Rate is: 57 Goal: <130/80 mmHG Hypertension Stage: Normal (SBP <120 and DBP < 80) Is Patient checking BP at home?: Yes Patient home BP readings are ranging: 100-110s/60s-70s How often does patient miss taking their blood pressure medications?: Never Has patient experienced hypotension, dizziness,  falls or bradycardia?: No Check present secondary causes (below) for HTN: CKD Does Patient use RPM device?: No BP RPM device: Does patient qualify?: No We discussed: Proper Home BP  Measurement, Reducing the amount of salt intake to <1532m/per day., DASH diet:  following a diet emphasizing fruits and vegetables and low-fat dairy products along with whole grains, fish, poultry, and nuts. Reducing red meats and sugars. Assessment:: Controlled Drug: Atenolol 1027mQD Assessment: Appropriate, Effective, Safe, Accessible HC Follow up: N/A Pharmacist Follow up: Continue to follow BP readings. Assess for hypotension as well.  Hyperlipidemia/Dyslipidemia (HLD) Last Lipid panel on: 12/22/2020 TC (Goal<200): 176 LDL: 94 HDL (Goal>40): 63 TG (Goal<150): 97 ASCVD 10-year risk?is:: Borderline (5%-7.5%) ASCVD Risk Score: 7.9% Assess this condition today?: Yes LDL Goal: <70 Has patient tried and failed any HLD Medications?: No Check present secondary causes (below) that can lead to increased cholesterol levels (multi-choice optional): CKD, Beta blockers, Hypothyroidism We discussed: Encouraged increasing fiber to a daily intake of 10-25g/day, How a diet high in plant sterols (fruits/vegetables/nuts/whole grains/legumes) may reduce your cholesterol. Assessment:: Uncontrolled Drug: None Additional Info: Pt is uninterested in statin at this time [facility]A Pharmacist Follow up: Lipid panel  Anxiety Assess this condition today?: Yes Completing the GAD-7 Questionnaire today?: No In your opinion, how do you feel your anxiety symptoms have been controlled over the past 3 months?: Improved Assessment:: Controlled Drug: Alprazolam 0.5 to 1 tablet daily if needed Assessment: Appropriate, Effective, Safe, Accessible Drug: Sertraline 10028mD Assessment: Appropriate, Effective, Safe, Accessible HC Follow up: N/A Pharmacist Follow up: GAD 7 assessment in March, assess mood.  Chronic kidney  disease (CKD) Previous GFR: 59 taken on: 01/30/2021 The current microalbumin ratio is: 0.3 tested on: 06/14/2020 Assess this condition today?: Yes CKD Stage: Stage 3a (GFR 45-60 mL/min) Albuminuria Stage: A1 (<30) Contributing factors for developing CKD: HTN Is Patient taking statin medication: No Is patient taking ACEi / ARB?: No Reason patient is not taking ACEi / ARB: Acute renal failure with lisinopril Renal dose adjustments recommended?: No We discussed: Limiting dietary sodium intake to less than 2000 mg / day, Maintaining blood pressure control, Maintaining blood glucose control, Avoidance of nephrotoxic drugs (NSAIDs) Assessment:: Controlled Drug: None HC Follow up: N/A Pharmacist Follow up: Monitor SCr and GFR, adjust meds as necessary  Hypothyroidism Current TSH: 1.31 taken on: 03/24/2021 Assess this condition today?: Yes We discussed: Proper administration of levothyroxine (empty stomach, 30 min before breakfast and with no other meds / vitamins), Monitoring for signs / symptoms of hypothyroidism (weight gain, fatigue, hair loss, cold intolerance), Monitoring signs / symptoms of hyperthyroidism (weight loss, tachycardia, anxiety, increased sweating) Assessment:: Controlled Drug: Take Levothyroxine 125 mcg 1 tablet by mouth on Monday, Wednesday and Friday and 1/2 tab all other days. Assessment: Appropriate, Effective, Safe, Accessible HC Follow up: N/A Pharmacist Follow up: Continue to monitor TSH levels  Heart Failure with Preserved Ejection Fraction (HFpEF) Last Echo with EF: 55-60%  taken on: 04/09/2021 Last BNP: 207 taken on: 11/13/2020 Assess this condition today?: Yes Reviewed last eGFR/SCr: Yes Reviewed last Potassium lab: Yes Today's weight:: 169.1 NYHA Class: I (no activity limitation) ACC/AHA stage: A (HF risk factors present) Patient checking daily weights?: Yes What is patient's recent weights?: 166.4, 168, 169.1 Has weight increased more than 3 lbs.  in one day or more than 5 lbs. in a week?: No Does Patient use RPM device?: Yes We discussed: Sodium restriction, Avoidance of potassium supplements, Daily weight monitoring Assessment:: Controlled Drug: None HC Follow up: Monitor daily weights, RPM Pharmacist Follow up: Pt was informed to contact office if weights increase &gt;3 pounds in 1 day or &gt;5 pounds in 1 week. Continue  to monitor for swelling, SOB, fatigue.  Exercise, Diet and Non-Drug Coordination Needs Additional exercise counseling points. We discussed: decreasing sedentary behavior, incorporating flexibility, balance, and strength training exercises Additional diet counseling points. We discussed: key components of the DASH diet Discussed Non-Drug Care Coordination Needs: Yes Does Patient have Medication financial barriers?: No  Accountable Health Communities Health-Related Social Needs Screening Tool -  SDOH  ([email or web address]) What is your living situation today? (ref #1): I have a steady place to live Think about the place you live. Do you have problems with any of the following? (ref #2): None of the above Within the past 12 months, you worried that your food would run out before you got money to buy more (ref #3): Never true Within the past 12 months, the food you bought just didn't last and you didn't have money to get more (ref #4): Never true In the past 12 months, has lack of reliable transportation kept you from medical appointments, meetings, work or from getting things needed for daily living? (ref #5): No In the past 12 months, has the electric, gas, oil, or water company threatened to shut off services in your home? (ref #6): No How often does anyone, including family and friends, physically hurt you? (ref #7): Never (1) How often does anyone, including family and friends, insult or talk down to you? (ref #8): Never (1) How often does anyone, including  friends and family, threaten you with harm? (ref #9): Never (1) How often does anyone, including family and friends, scream or curse at you? (ref #10): Never (1)  Engagement Notes Newton Pigg on 04/29/2021 04:05 PM HC Chart/ CP prep: 48 Minutes CPP Chart Review: 30 min CPP Office Visit: 13 min CPP Office Visit Documentation: 50 min CPP Coordination of Care: [place] Completion:25 min CPP Care Plan Review:11 min  Engagement Notes Newton Pigg on 04/29/2021 04:09 PM HC F/u: Anxiety Assessment/ GAD-7 questionnaire in March  CPP F/u: 06/13/21: Ok [place] w/ Caryl Pina 12/23/21: Ok [place] w/ Ashley 04/28/22 @ 11am: CCM phone follow up (DHF, Osteopenia, Anxiety; Depression, cholesterol, TSH)   Patient Name: Keila, Turan DOB:  [birth year]   Last Care Plan Update: 04/29/2021 COMPREHENSIVE CARE PLAN AND GOALS   HYPERTENSION  MOST RECENT BLOOD PRESSURE:     113/76 MY GOAL BLOOD PRESSURE:  <130/80 CURRENT MEDICATION AND DOSING:  Atenolol 174m once daily THE GOALS WE HAVE CHOSEN ARE:    -Maintain an at goal blood pressure  BARRIERS TO ACHIEVING GOALS:  -At goal PLAN TO WORK ON THESE GOALS:  -Take medications regularly   -Check BP at home  -Limit Alcohol 1-2 drinks per day  -Reduce salt intake (< 15026m day)  -Diet: DASH diet (Choose fruits, vegetables, and low-fat dairy products. Increase whole grains, fish, poultry, nuts. Reduce red meats and sugars)  -Weight: 1 kg = ~1 mmHg reduction  -Exercise  CHOLESTEROL  MOST RECENT LABS:    12/22/2020 -TOTAL CHOLESTEROL: 176 -TRIGLYCERIDES: 97 -HDL: 63 -LDL: 94 CURRENT MEDICATION AND DOSING:  None THE GOALS WE HAVE CHOSEN ARE:    -Total Cholesterol goal under 200, Triglycerides goal under 150, HDL goal above 40, LDL goal under 100  BARRIERS TO ACHIEVING GOALS:  -Not at goal PLAN TO WORK ON THESE GOALS:  -Diet: high in plant sterols (fruits/ vegetables/ nuts/ whole grains/ legumes). Increase fiber intake (10-25g/day). Avoid foods high in  cholesterol (red meat, egg yolks, dairy, oils/ butter). Choose low-fat options.  -Exercise  -Weight Management  HEART FAILURE  TYPE OF  HEART FAILURE: Heart Failure with preserved ejection fraction (HFpEF)    LAST EJECTION FRACTION/DATE:  55-60% 04/09/2021 CURRENTLY STAGED AT:   A CURRENT MEDICATION AND DOSING:  None THE GOALS WE HAVE CHOSEN ARE:   -Reduce shortness of breath and reduce hospitalizations due to heart failure exacerbations  BARRIERS TO ACHIEVING GOALS:  At goal PLAN TO WORK ON THESE GOALS:  -Check weight daily and log  -Maintain low sodium diet < 1532m/ day  -Activity plan: start slow and stop if feeling chest pain  -Discussed CHF action plan  *GREEN: Take medications as directed, go to your doctor's appointments   *YELLOW: May need diuretic adjustment, call MD or CPP: Weight gain > 3lbs in one day or 3-5 lbs in one week; increased swelling or coughing; increased number of pillows to sleep, shortness of breath with activity  *RED: CALL MD NOW or 911 (if chest pain): weight gain > 5 lbs in 1 week; waking at night SOB; dizzy or falling; SOB at rest, chest tightness or wheezing     HYPOTHYROIDISM  RECENT TSH/DATE:  1.31 03/24/2021      CURRENT MEDICATION AND DOSING:  Levothyroxine 125 mcg 1 tablet by mouth on Monday, Wednesday and Friday and 1/2 tab all other days. THE GOALS WE HAVE CHOSEN ARE:   -Maintain TSH between 0.45 to 4.5uIU/ml  BARRIERS TO ACHIEVING GOALS:  -At goal PLAN TO WORK ON THESE GOALS:    -Take medication with water at the same time each day for consistent absorption at least 1 hour before breakfast or at bedtime (3 hours after last meal)  -Recognize signs and symptoms of thyroid imbalance:  -Hyperthyroid: Heat intolerance (increased sweating), weight loss, agitation/ nervousness/ irritability/ anxiety, palpitations/ tachycardia, fatigue/ muscle weakness, diarrhea, insomnia, tremor, thinning hair, exophthalmos  -Hypothyroid: cold intolerance, dry  skin, fatigue, muscle cramps, voice changes, constipation, weight gain, myalgias, weakness, depression, bradycardia, hair loss, memory/ mental impairment       Chronic Kidney Disease  Labs:  GFR 59 on 01/30/21 Microalbumin ratio 0.3 on 06/14/20 PLAN TO WORK ON THESE GOALS:    -Maintaining normal BP and BG  -Diet: Decrease salt and fatty foods. Increase fruit and vegetables    ACTIVE MEDICATION LIST  Your current medication list has been updated. To view, log in to your patient portal.  Call if any changes need to be made.   MEDICATION REVIEW  MEDICATION REVIEW CONDUCTED:   Yes   DATE:    04/29/2021 BEST POSSIBLE MEDICATION HISTORY  SOURCE:   Medical Records     HOW DO I? - WHEN DO I?   GET AHOLD OF MY DOCTOR?   DURING BUSINESS HOURS WHEN THE OFFICE IS OPEN    PHONE: [phone] HOURS [facility] NURSE WHEN THE OFFICE IS CLOSED   PHONE: [phone]  TALK TO [facility]ARE COORDINATOR NAME: ANewton Pigg PHONE: [phone]:  [email or web address] CARE COORDINATOR STAFF   NAME: ARosary Lively MMarlene Bast MSandria Bales PHONE: [phone] NOTE SECTION  Thank you for participating in the Chronic Care Management (CCM) program with Dr. ANewton Pigg  This program takes a proactive approach to your health and my team will serve as a resource for you throughout the year. Please follow up at [phone] if you have any questions or experience changes to your overall health. Your next CCM appointment will be conducted with ANewton Pigg PharmD as follows:    Date: 04/28/2022    Time: 11:00AM   Over the Phone  Rachelle Hora Jeannett Senior, PharmD  Clinical Pharmacist  [email or web address]  [phone]

## 2021-05-05 DIAGNOSIS — K4090 Unilateral inguinal hernia, without obstruction or gangrene, not specified as recurrent: Secondary | ICD-10-CM | POA: Insufficient documentation

## 2021-05-12 ENCOUNTER — Ambulatory Visit: Payer: PPO | Attending: Surgery | Admitting: Physical Therapy

## 2021-05-12 ENCOUNTER — Other Ambulatory Visit: Payer: Self-pay

## 2021-05-12 ENCOUNTER — Encounter: Payer: Self-pay | Admitting: Physical Therapy

## 2021-05-12 DIAGNOSIS — R279 Unspecified lack of coordination: Secondary | ICD-10-CM | POA: Insufficient documentation

## 2021-05-12 DIAGNOSIS — M6281 Muscle weakness (generalized): Secondary | ICD-10-CM | POA: Insufficient documentation

## 2021-05-12 NOTE — Therapy (Signed)
[facility] @ [facility] [place], [place], [address] Phone: [phone]   Fax:  [phone]  Physical Therapy Evaluation  Patient Details  Name: [name] MRN: [id number] Date of Birth: 18-Jan-1951 Referring Provider (PT): Dr. Michael Boston   Encounter Date: 05/12/2021   PT End of Session - 05/12/21 1628     Visit Number 1    Date for PT Re-Evaluation 08/04/21    Authorization Type healthteam    PT Start Time 0845    PT Stop Time 0930    PT Time Calculation (min) 45 min    Activity Tolerance Patient tolerated treatment well    Behavior During Therapy [facility] for tasks assessed/performed             Past Medical History:  Diagnosis Date   ADHD (attention deficit hyperactivity disorder)    Chronic diastolic CHF (congestive heart failure) ([place])    cardriologist--- dr Viona Gilmore. Audie Box;   dx in setting sepsis 11/ 2020;   echo in epic 03-10-2019 ef 55-60%, G1DD, mild to moderate TR no stenosis and normal RASP;  CTA 01-31-2021 epic   Chronic kidney disease (CKD), stage III (moderate) ([place])    nephrologist--- dr Amedeo Gory   Depression    Diverticulosis of colon    Emphysema of lung ([place])    no home oxygen   GERD (gastroesophageal reflux disease)    Hiatal hernia    History of acute pyelonephritis    w/ hx sepsis   History of Helicobacter pylori infection 2012   History of recurrent UTIs    History of TIA (transient ischemic attack)    TIAs   Hyperlipidemia, mixed    Hypertension    followed by pcp and nephrologist----  (nuclear stress test in epic 06-28-2015 low risk no ischemia, nuclear ef 60%)   Hypothyroidism    IBS (irritable bowel syndrome)    IC (interstitial cystitis)    remote hx   Inguinal hernia, bilateral    Irritable bowel syndrome with constipation and diarrhea    LBBB (left bundle branch block) 2015   cardiologist --- dr Audie Box;  known chronic LBBB for several yrs   Migraines    OCD (obsessive  compulsive disorder)    OSA (obstructive sleep apnea)    no CPAP intolerant   Osteopenia    Prolapsed hemorrhoids    Seasonal allergies    Vitamin D deficiency     Past Surgical History:  Procedure Laterality Date   BREAST ENHANCEMENT SURGERY Bilateral    1970s   BREAST IMPLANT REMOVAL Bilateral 03/07/2019   BUNIONECTOMY Bilateral 1997   CATARACT EXTRACTION W/ INTRAOCULAR LENS IMPLANT Right 2005   COLONOSCOPY  10/2020   ESOPHAGOGASTRODUODENOSCOPY (EGD) WITH PROPOFOL N/A 03/12/2019   Procedure: ESOPHAGOGASTRODUODENOSCOPY (EGD) WITH PROPOFOL;  Surgeon: Carol Ada, MD;  Location: [facility];  Service: Endoscopy;  Laterality: N/A;   HEMORRHOID SURGERY  1976   INGUINAL HERNIA REPAIR N/A 04/10/2021   Procedure: LAPAROSCOPIC LEFT INGUINAL HERNIA REPAIR AND RIGHT INGUINAL HERNII REPAIR WITH TAP BLOCK;  Surgeon: Michael Boston, MD;  Location: [facility];  Service: General;  Laterality: N/A;   ORIF ANKLE FRACTURE Left 08/30/2017   Procedure: OPEN REDUCTION INTERNAL FIXATION (ORIF) ANKLE FRACTURE;  Surgeon: Rod Can, MD;  Location: [facility];  Service: Orthopedics;  Laterality: Left;   TONSILLECTOMY     CHILD    There were no vitals filed for this visit.    Subjective Assessment - 05/12/21 9371  Subjective Fecal incontinence in 2017 and the stool started to leak. She has leakage all the time. Reduced her activities due to the incontinence. When takes the Miralax the stool comes out more. When has diarrhea then Type 5,6,7 When constipated Type 1. She feels like her anus is open all the time. She has not push to get the stool out. Urinary leakage and wears a pad all the time. When has a UTI or IC flareup will have pain with urination.    Patient Stated Goals Have control over the bowels and bladder    Currently in Pain? No/denies    Multiple Pain Sites No                [facility] PT Assessment - 05/12/21 0001       Assessment   Medical Diagnosis R15.9, R15.2  Incontinence of feces with fecal urgency    Referring Provider (PT) Dr. Michael Boston    Prior Therapy none      Precautions   Precautions None      Restrictions   Weight Bearing Restrictions No      Balance Screen   Has the patient fallen in the past 6 months No    Has the patient had a decrease in activity level because of a fear of falling?  No    Is the patient reluctant to leave their home because of a fear of falling?  No      Home Ecologist residence      Prior Function   Level of [place] Retired    Leisure walking, exercise bike, general exercise      Cognition   Overall Cognitive Status Within Functional Limits for tasks assessed      Posture/Postural Control   Posture/Postural Control No significant limitations      ROM / Strength   AROM / PROM / Strength AROM;PROM;Strength      PROM   Right Hip External Rotation  30    Left Hip External Rotation  40      Strength   Overall Strength Comments contracts the upper abdomen and bulges the lower    Right Hip Extension 4/5    Right Hip ABduction 3/5    Left Hip Extension 3+/5    Left Hip ABduction 3/5      Palpation   Palpation comment decreased mobility of the lower rib cage; decreased abdominal tissue mobility;      Special Tests    Special Tests Hip Special Tests    Hip Special Tests  Trendelenberg Test      Trendelenburg Test   Findings Positive    Side Left    Comments right hip goes down                        Objective measurements completed on examination: See above findings.     Pelvic Floor Special Questions - 05/12/21 0001     Prior Pregnancies Yes    Number of Vaginal Deliveries 3    Urinary Leakage Yes    Pad use 6-7 per day; 3 per night    Activities that cause leaking Other;With strong urge;Coughing;Sneezing;Laughing;Lifting;Running    Other activities that cause leaking wakes up at night and will leak as she  walks to the bathroom;    Fecal incontinence Yes   standing, walking; stool type is type 1 when constipated and type 5-7 with diarrhea  Fluid intake drink decaf tea    Caffeine beverages 1 cup of coffee per day    Falling out feeling (prolapse) Yes    Activities that cause feeling of prolapse when wipes she feels a bulge    Skin Integrity Intact    Scar Restricted;Well healed    Prolapse Posterior Wall    Pelvic Floor Internal Exam Patient confirms identification and approves PT to assess pelvic floor and treatment    Exam Type Vaginal;Rectal    Palpation tightness along the left side of the rectum, not to push the therapist finger out rectum; rectum is open    Strength --   vaginal 2/5 except on superior left is 0/5; Rrectal strength EAS is 1/5, IAS is 0/5 and less contraction anteriorly                        PT Short Term Goals - 05/12/21 1655       PT SHORT TERM GOAL #1   Title independent iwth initial HEP for core and pelvic floor    Time 4    Period Weeks    Status New    Target Date 06/09/21      PT SHORT TERM GOAL #2   Title able to perfrom a full circular contraction of the anus and vagina    Time 4    Period Weeks    Status New    Target Date 06/09/21      PT SHORT TERM GOAL #3   Title understand ways to add fiber to increase stool consistency    Time 4    Period Weeks    Status New    Target Date 06/09/21               PT Long Term Goals - 05/12/21 1658       PT LONG TERM GOAL #1   Title independent with advanced HEP for core and pelvic floor strength    Time 12    Period Weeks    Status New    Target Date 08/04/21      PT LONG TERM GOAL #2   Title wears </= 1 pad per day and night due to increased pelvic floor strength and reduction of leakage    Time 12    Period Weeks    Status New    Target Date 08/04/21      PT LONG TERM GOAL #3   Title able to push her hterapist finger out of the rectum so she will be able to push her  stool out without a struggle    Time 12    Period Weeks    Status New    Target Date 08/04/21      PT LONG TERM GOAL #4   Title rectal strength >/= 3/5 so her stool leakage is decreased >?= 75%    Time 12    Period Weeks    Status New    Target Date 08/04/21      PT LONG TERM GOAL #5   Title vaginal strength >/= 3/5 to reduce her urinary leakage >/= 60%    Time 12    Period Weeks    Status New    Target Date 08/04/21                    Plan - 05/12/21 1629     Clinical Impression Statement Patient is a 71 year old female with  urinary and fecal incontience. She had a hernia repair and right inguinal hernial repair on 04/10/2021 by Dr. Johney Maine. Patient reports she will leak stool with standing and walking. She feels like her anus is always open. Patient takes mirilax to reduce constipation but ther stools will be Type 5-7. If she does not take the Miralax the stools will be type 1. External anal sphincter strength is 1/5 and internal anal sphincter strength is 0/5. The puborectalis comes forward. She ahs tightness along the left side of the rectum. she is unable to push the therapist finger out. Her rectum is low tone. Patient wears 6-7 pads during the day and 3 per night. Patient leaks urine with strong urge, coughing, sneezing, laughing, and lifting. Patient will wake up at night with urinary leakage then has to go to the bathroom. When patient wipes herself she feels a bulge. She has a posterior wall weakness in the vagina. Patient has positive Trendelenberg on the left. Patient has decreased rib cage mobility. She has decreased mobility of her abdominal tissue. Her right hip P/ROM for external rotation is 30 degrees and left is 40 degrees. Patient bilateral hip strength for abduction and extension ranges from 3-4/5. Patient will benefit from skilled therapy to improve pelvic floor strength and coordination to reduce her leakage.    Personal Factors and Comorbidities  Age;Comorbidity 3+;Fitness    Comorbidities hernia repair and inguinal hernia repair 04/10/2021; vaginal hysterectomy; COPD; IBS; Chronic kidney disease    Examination-Activity Limitations Stand;Toileting;Continence;Locomotion Level;Lift    Examination-Participation Restrictions Community Activity;Cleaning    Stability/Clinical Decision Making Evolving/Moderate complexity    Clinical Decision Making Moderate    Rehab Potential Good    PT Frequency 2x / week    PT Duration 12 weeks    PT Treatment/Interventions ADLs/Self Care Home Management;Biofeedback;Therapeutic activities;Therapeutic exercise;Neuromuscular re-education;Patient/family education;Manual techniques;Taping;Joint Manipulations;Spinal Manipulations    PT Next Visit Plan discuss diet with fiber to work with consistent stool consistency; manual work to the anus on the left and facilitate the sphincter to contract; rib work to improve diaphragm mobility; abdminal massage    Consulted and Agree with Plan of Care Patient             Patient will benefit from skilled therapeutic intervention in order to improve the following deficits and impairments:  Decreased coordination, Increased fascial restricitons, Decreased endurance, Decreased activity tolerance, Decreased strength  Visit Diagnosis: Muscle weakness (generalized) - Plan: PT plan of care cert/re-cert  Unspecified lack of coordination - Plan: PT plan of care cert/re-cert     Problem List Patient Active Problem List   Diagnosis Date Noted   Incontinence of feces 12/23/2020   Interstitial cystitis 06/14/2020   Overweight (BMI 25.0-29.9) 06/13/2020   Former smoker (40 pack year, quit 2004) 08/30/2019   Atherosclerosis of aorta ([place]) 03/22/2019   Emphysema lung ([place])    Chronic diastolic CHF (congestive heart failure) ([place]) 03/11/2019   Osteopenia 01/16/2019   History of colon polyps 06/28/2018   CKD Stage 3 ([place]) 01/10/2015   IBS  01/10/2015   Hyperlipidemia,  mixed 06/12/2014   Medication management 06/12/2014   LBBB (left bundle branch block) 07/24/2013   HTN (hypertension) 07/24/2013   Major depression, recurrent, full remission ([place])    OCD (obsessive compulsive disorder)    Abnormal glucose    Migraines    Vitamin D deficiency    Hypothyroidism 04/05/2013    Earlie Counts, PT 05/12/21 5:03 PM  [facility] @ [facility]  [place], [place], [address] Phone: [phone]   Fax:  [phone]  Name: [name] MRN: [id number] Date of Birth: Jan 30, 1951

## 2021-05-15 ENCOUNTER — Ambulatory Visit (INDEPENDENT_AMBULATORY_CARE_PROVIDER_SITE_OTHER): Payer: PPO | Admitting: Nurse Practitioner

## 2021-05-15 ENCOUNTER — Encounter: Payer: Self-pay | Admitting: Nurse Practitioner

## 2021-05-15 ENCOUNTER — Other Ambulatory Visit: Payer: Self-pay

## 2021-05-15 VITALS — BP 100/72 | HR 71 | Temp 97.9°F | Wt 167.0 lb

## 2021-05-15 DIAGNOSIS — R300 Dysuria: Secondary | ICD-10-CM

## 2021-05-15 MED ORDER — LEVOFLOXACIN 500 MG PO TABS: 500.0000 mg | ORAL_TABLET | Freq: Every day | ORAL | 0 refills | Status: AC

## 2021-05-15 NOTE — Progress Notes (Signed)
Assessment and Plan:  [name] was seen today for acute visit.  Diagnoses and all orders for this visit:  Dysuria She has several antibiotic allergies, has used levaquin with success in the past -     levofloxacin (LEVAQUIN) 500 MG tablet; Take 1 tablet (500 mg total) by mouth daily for 7 days. -     Urine Culture       Further disposition pending results of labs. Discussed med's effects and SE's.   Over 30 minutes of exam, counseling, chart review, and critical decision making was performed.   Future Appointments  Date Time Provider [place]  05/23/2021  9:30 AM Monico Hoar, PT [facility] None  05/28/2021 10:15 AM Monico Hoar, PT [facility] None  06/02/2021  2:45 PM Monico Hoar, PT [facility] None  06/09/2021 11:45 AM Monico Hoar, PT [facility] None  06/11/2021 10:15 AM Monico Hoar, PT [facility] None  06/13/2021 10:00 AM Liane Comber, NP [facility] None  06/16/2021  9:30 AM Monico Hoar, PT [facility] None  06/18/2021 11:45 AM Monico Hoar, PT [facility] None  07/04/2021  9:40 AM Jaquita Folds, MD [facility]  12/23/2021 10:30 AM Liane Comber, NP [facility] None  04/28/2022 11:00 AM Newton Pigg, RPH [facility] None    ------------------------------------------------------------------------------------------------------------------   HPI BP 100/72    Pulse 71    Temp 97.9 F (36.6 C)    Wt 167 lb (75.8 kg)    SpO2 97%    BMI 29.58 kg/m  71 y.o.female presents for feeling of fullness and pain with urination, odor to urine.  Denies fevers, back pain and blood in urine.    Past Medical History:  Diagnosis Date   ADHD (attention deficit hyperactivity disorder)    Chronic diastolic CHF (congestive heart failure) ([place])    cardriologist--- dr Viona Gilmore. Audie Box;   dx in setting sepsis 11/ 2020;   echo in epic 03-10-2019 ef 55-60%, G1DD, mild to moderate TR no stenosis and normal RASP;  CTA 01-31-2021 epic   Chronic kidney disease (CKD), stage III (moderate)  ([place])    nephrologist--- dr Amedeo Gory   Depression    Diverticulosis of colon    Emphysema of lung ([place])    no home oxygen   GERD (gastroesophageal reflux disease)    Hiatal hernia    History of acute pyelonephritis    w/ hx sepsis   History of Helicobacter pylori infection 2012   History of recurrent UTIs    History of TIA (transient ischemic attack)    TIAs   Hyperlipidemia, mixed    Hypertension    followed by pcp and nephrologist----  (nuclear stress test in epic 06-28-2015 low risk no ischemia, nuclear ef 60%)   Hypothyroidism    IBS (irritable bowel syndrome)    IC (interstitial cystitis)    remote hx   Inguinal hernia, bilateral    Irritable bowel syndrome with constipation and diarrhea    LBBB (left bundle branch block) 2015   cardiologist --- dr Audie Box;  known chronic LBBB for several yrs   Migraines    OCD (obsessive compulsive disorder)    OSA (obstructive sleep apnea)    no CPAP intolerant   Osteopenia    Prolapsed hemorrhoids    Seasonal allergies    Vitamin D deficiency      Allergies  Allergen Reactions   Ace Inhibitors Other (See Comments)    Acute renal failure (lisinopril)   Cephalexin Anaphylaxis   Dilaudid [Hydromorphone Hcl] Anaphylaxis  Robaxin [Methocarbamol] Anaphylaxis   Macrobid [Nitrofurantoin] Other (See Comments)    Causes hyperthermia    Neurontin [Gabapentin]     Confusion    Codeine Nausea Only   Latex Rash   Sulfa Antibiotics Other (See Comments)    Do not take per nephrology    Current Outpatient Medications on File Prior to Visit  Medication Sig   acetaminophen (TYLENOL) 500 MG tablet Take 500 mg by mouth every 6 (six) hours as needed.   albuterol (VENTOLIN HFA) 108 (90 Base) MCG/ACT inhaler Inhale 1-2 puffs into the lungs every 4 (four) hours as needed for wheezing or shortness of breath. (Patient taking differently: Inhale 1-2 puffs into the lungs every 4 (four) hours as needed for wheezing or shortness of breath.)    ALPRAZolam (XANAX) 0.5 MG tablet TAKE 1/2 TO 1 TABLET ONCE A DAY IF NEEDED FOR SEVERE ANXIETY OR SLEEP. **Use sparingly to prevent addiction and tolerance.   aspirin EC 81 MG tablet Take 81 mg by mouth daily.    aspirin-acetaminophen-caffeine (EXCEDRIN MIGRAINE) 250-250-65 MG tablet Take 1 tablet by mouth every 6 (six) hours as needed for headache.   atenolol (TENORMIN) 100 MG tablet Take 1 tablet daily  for blood pressure (Patient taking differently: Take 50 mg by mouth at bedtime. Take 1/2 tablet daily  for blood pressure)   B Complex Vitamins (B COMPLEX PO) Take 1 capsule by mouth daily.   buPROPion (WELLBUTRIN XL) 150 MG 24 hr tablet TAKE 3 TABLETS BY MOUTH EVERY MORNING FOR MOOD AND DEPRESSION (Patient taking differently: 150 mg daily.)   calcium carbonate (TUMS - DOSED IN MG ELEMENTAL CALCIUM) 500 MG chewable tablet Chew 2 tablets by mouth daily.   cetirizine (ZYRTEC) 10 MG tablet Take 10 mg by mouth daily.   Cholecalciferol (VITAMIN D-3) 5000 UNITS TABS Take 5,000 Units by mouth daily.    Cinnamon 500 MG capsule Take 500 mg by mouth daily.   diphenhydrAMINE (BENADRYL) 25 MG tablet Take 25 mg by mouth every 6 (six) hours as needed.   famotidine (PEPCID) 20 MG tablet TAKE 1 TABLET BY MOUTH TWICE DAILY FOR INDIGESTION AND HEARTBURN (Patient taking differently: Take 40 mg by mouth 2 (two) times daily.)   guaifenesin (HUMIBID E) 400 MG TABS tablet Take 400 mg by mouth daily.   hyoscyamine (LEVSIN SL) 0.125 MG SL tablet DISSOLVE ONE TABLET UNDER THE TONGUE EVERY 4 HOURS IF NEEDED FOR NAUSEA, BLOATING, CRAMPING, OR DIARRHEA   ipratropium-albuterol (DUONEB) 0.5-2.5 (3) MG/3ML SOLN USE 1 VIAL IN NEBULIZER EVERY 4 HOURS AS NEEDED **MAX 6 DOSES A DAY**   levothyroxine (SYNTHROID) 125 MCG tablet Take 1 tablet by mouth on Monday, Wednesday and Friday and 1/2 tab all other days. Take on an empty stomach with only water for 30 minutes & no antacid calcium or magnesium for 4 hours & avoid biotin (Patient  taking differently: as directed. Take 1 tablet by mouth on Monday, Wednesday and Friday and 1/2 tab all other days. Take on an empty stomach with only water for 30 minutes & no antacid calcium or magnesium for 4 hours & avoid biotin TAKES AT  0300)   Magnesium 400 MG CAPS Take 1 capsule by mouth daily.   ondansetron (ZOFRAN) 8 MG tablet TAKE 1 TABLET BY MOUTH 3 TIMES A DAY AS NEEDED FOR NAUSEA (Patient taking differently: Take 8 mg by mouth every 8 (eight) hours as needed. TAKE 1 TABLET BY MOUTH 3 TIMES A DAY AS NEEDED FOR NAUSEA)   sertraline (  ZOLOFT) 100 MG tablet Take 1 tablet by mouth daily for mood (Patient taking differently: Take 100 mg by mouth daily. Take 1 tablet by mouth daily for mood)   Turmeric 500 MG CAPS Take 1 tablet by mouth daily.   vitamin C (ASCORBIC ACID) 500 MG tablet Take 500 mg by mouth daily.   vitamin E 180 MG (400 UNITS) capsule Take 400 Units by mouth daily.   Budeson-Glycopyrrol-Formoterol (BREZTRI AEROSPHERE) 160-9-4.8 MCG/ACT AERO Inhale into the lungs. (Patient not taking: Reported on 04/07/2021)   promethazine-dextromethorphan (PROMETHAZINE-DM) 6.25-15 MG/5ML syrup Take 5 mLs by mouth 4 (four) times daily as needed for cough. (Patient not taking: Reported on 02/06/2021)   traMADol (ULTRAM) 50 MG tablet Take 1-2 tablets (50-100 mg total) by mouth every 6 (six) hours as needed for moderate pain or severe pain.   No current facility-administered medications on file prior to visit.    ROS: all negative except above.   Physical Exam:  BP 100/72    Pulse 71    Temp 97.9 F (36.6 C)    Wt 167 lb (75.8 kg)    SpO2 97%    BMI 29.58 kg/m   General Appearance: Well nourished, in no apparent distress. Eyes: PERRLA, EOMs, conjunctiva no swelling or erythema Sinuses: No Frontal/maxillary tenderness ENT/Mouth: Ext aud canals clear, TMs without erythema, bulging. No erythema, swelling, or exudate on post pharynx.  Tonsils not swollen or erythematous. Hearing normal.   Neck: Supple, thyroid normal.  Respiratory: Respiratory effort normal, BS equal bilaterally without rales, rhonchi, wheezing or stridor.  Cardio: RRR with no MRGs. Brisk peripheral pulses without edema.  Abdomen: Soft, + BS.  Non tender, no guarding, rebound, hernias, masses. Lymphatics: Non tender without lymphadenopathy.  Musculoskeletal: Full ROM, 5/5 strength, normal gait. Negative CVA tenderness Skin: Warm, dry without rashes, lesions, ecchymosis.  Neuro: Cranial nerves intact. Normal muscle tone, no cerebellar symptoms. Sensation intact.  Psych: Awake and oriented X 3, normal affect, Insight and Judgment appropriate.     Magda Bernheim, NP 1:53 PM [facility]

## 2021-05-17 ENCOUNTER — Encounter: Payer: Self-pay | Admitting: Nurse Practitioner

## 2021-05-17 LAB — URINE CULTURE
MICRO NUMBER:: 12892893
SPECIMEN QUALITY:: ADEQUATE

## 2021-05-23 ENCOUNTER — Other Ambulatory Visit: Payer: Self-pay

## 2021-05-23 ENCOUNTER — Ambulatory Visit: Payer: PPO | Admitting: Physical Therapy

## 2021-05-23 ENCOUNTER — Encounter: Payer: Self-pay | Admitting: Physical Therapy

## 2021-05-23 DIAGNOSIS — R279 Unspecified lack of coordination: Secondary | ICD-10-CM

## 2021-05-23 DIAGNOSIS — M6281 Muscle weakness (generalized): Secondary | ICD-10-CM

## 2021-05-23 NOTE — Patient Instructions (Signed)
Access Code: [email or web address] URL: [email or web address] Date: 05/23/2021 Prepared by: Earlie Counts  Exercises Supine Diaphragmatic Breathing - 2 x daily - 7 x weekly - 1 sets - 10 reps Seated Diaphragmatic Breathing - 3 x daily - 7 x weekly - 1 sets - 10 reps Positioning on Toilet and Defecation Technique - 1 x daily - 7 x weekly - 3 sets - 10 reps [facility] [address] [place], [place] [address] Phone # [phone] Fax [phone]

## 2021-05-23 NOTE — Therapy (Signed)
[facility] @ [facility] [place], [place], [address] Phone: [phone]   Fax:  [phone]  Physical Therapy Treatment  Patient Details  Name: [name] MRN: [id number] Date of Birth: 17-Apr-1951 Referring Provider (PT): Dr. Michael Boston   Encounter Date: 05/23/2021   PT End of Session - 05/23/21 0935     Visit Number 2    Date for PT Re-Evaluation 08/04/21    Authorization Type healthteam    PT Start Time 0845    PT Stop Time 0925    PT Time Calculation (min) 40 min    Activity Tolerance Patient tolerated treatment well    Behavior During Therapy [facility] for tasks assessed/performed             Past Medical History:  Diagnosis Date   ADHD (attention deficit hyperactivity disorder)    Chronic diastolic CHF (congestive heart failure) ([place])    cardriologist--- dr Viona Gilmore. Audie Box;   dx in setting sepsis 11/ 2020;   echo in epic 03-10-2019 ef 55-60%, G1DD, mild to moderate TR no stenosis and normal RASP;  CTA 01-31-2021 epic   Chronic kidney disease (CKD), stage III (moderate) ([place])    nephrologist--- dr Amedeo Gory   Depression    Diverticulosis of colon    Emphysema of lung ([place])    no home oxygen   GERD (gastroesophageal reflux disease)    Hiatal hernia    History of acute pyelonephritis    w/ hx sepsis   History of Helicobacter pylori infection 2012   History of recurrent UTIs    History of TIA (transient ischemic attack)    TIAs   Hyperlipidemia, mixed    Hypertension    followed by pcp and nephrologist----  (nuclear stress test in epic 06-28-2015 low risk no ischemia, nuclear ef 60%)   Hypothyroidism    IBS (irritable bowel syndrome)    IC (interstitial cystitis)    remote hx   Inguinal hernia, bilateral    Irritable bowel syndrome with constipation and diarrhea    LBBB (left bundle branch block) 2015   cardiologist --- dr Audie Box;  known chronic LBBB for several yrs   Migraines    OCD (obsessive  compulsive disorder)    OSA (obstructive sleep apnea)    no CPAP intolerant   Osteopenia    Prolapsed hemorrhoids    Seasonal allergies    Vitamin D deficiency     Past Surgical History:  Procedure Laterality Date   BREAST ENHANCEMENT SURGERY Bilateral    1970s   BREAST IMPLANT REMOVAL Bilateral 03/07/2019   BUNIONECTOMY Bilateral 1997   CATARACT EXTRACTION W/ INTRAOCULAR LENS IMPLANT Right 2005   COLONOSCOPY  10/2020   ESOPHAGOGASTRODUODENOSCOPY (EGD) WITH PROPOFOL N/A 03/12/2019   Procedure: ESOPHAGOGASTRODUODENOSCOPY (EGD) WITH PROPOFOL;  Surgeon: Carol Ada, MD;  Location: [facility];  Service: Endoscopy;  Laterality: N/A;   HEMORRHOID SURGERY  1976   INGUINAL HERNIA REPAIR N/A 04/10/2021   Procedure: LAPAROSCOPIC LEFT INGUINAL HERNIA REPAIR AND RIGHT INGUINAL HERNII REPAIR WITH TAP BLOCK;  Surgeon: Michael Boston, MD;  Location: [facility];  Service: General;  Laterality: N/A;   ORIF ANKLE FRACTURE Left 08/30/2017   Procedure: OPEN REDUCTION INTERNAL FIXATION (ORIF) ANKLE FRACTURE;  Surgeon: Rod Can, MD;  Location: [facility];  Service: Orthopedics;  Laterality: Left;   TONSILLECTOMY     CHILD    There were no vitals filed for this visit.   Subjective Assessment - 05/23/21 0932  Subjective I started out with diarrhea to day due to taking Miralax yesterday. I eat alot of fiber. I have trouble pushing the stool out.    Patient Stated Goals Have control over the bowels and bladder    Currently in Pain? No/denies    Multiple Pain Sites No                               [facility] Adult PT Treatment/Exercise - 05/23/21 0001       Therapeutic Activites    Therapeutic Activities Other Therapeutic Activities    Other Therapeutic Activities education on correct toileting technique with breath, knees above hips, leaning forward      Neuro Re-ed    Neuro Re-ed Details  diphragmatic breathing in sittng and supine with lots of tactile and  verbal cue to expand her abdomen instead of contracting      Manual Therapy   Manual Therapy Soft tissue mobilization;Myofascial release    Soft tissue mobilization maual mobilization of the abdominal muscles    Myofascial Release tissue rolling of the abdomen, release around the mesenteric root, release suprapubically, release of the diaphragm in sitting                     PT Education - 05/23/21 1011     Education Details Access Code: [email or web address]    Person(s) Educated Patient    Methods Explanation;Demonstration;Verbal cues;Handout    Comprehension Returned demonstration;Verbalized understanding              PT Short Term Goals - 05/12/21 1655       PT SHORT TERM GOAL #1   Title independent iwth initial HEP for core and pelvic floor    Time 4    Period Weeks    Status New    Target Date 06/09/21      PT SHORT TERM GOAL #2   Title able to perfrom a full circular contraction of the anus and vagina    Time 4    Period Weeks    Status New    Target Date 06/09/21      PT SHORT TERM GOAL #3   Title understand ways to add fiber to increase stool consistency    Time 4    Period Weeks    Status New    Target Date 06/09/21               PT Long Term Goals - 05/12/21 1658       PT LONG TERM GOAL #1   Title independent with advanced HEP for core and pelvic floor strength    Time 12    Period Weeks    Status New    Target Date 08/04/21      PT LONG TERM GOAL #2   Title wears </= 1 pad per day and night due to increased pelvic floor strength and reduction of leakage    Time 12    Period Weeks    Status New    Target Date 08/04/21      PT LONG TERM GOAL #3   Title able to push her hterapist finger out of the rectum so she will be able to push her stool out without a struggle    Time 12    Period Weeks    Status New    Target Date 08/04/21      PT LONG TERM GOAL #4  Title rectal strength >/= 3/5 so her stool leakage is decreased >?= 75%     Time 12    Period Weeks    Status New    Target Date 08/04/21      PT LONG TERM GOAL #5   Title vaginal strength >/= 3/5 to reduce her urinary leakage >/= 60%    Time 12    Period Weeks    Status New    Target Date 08/04/21                   Plan - 05/23/21 1011     Clinical Impression Statement Patient has increased restrictions in the abdomen and are partially better after manual work. She is able to take a deeper breath. Patient will lift her chest more than she should for diaphragmatic breathing. Patient is still having issues with toileting. She does eat enough fiber. Patient has increased stress in her life that makes it difficult for her to relax. Patient will benefit from skilled therapy to improve pelvic floor strength and coordinaiton to reduce her leakage.    Personal Factors and Comorbidities Age;Comorbidity 3+;Fitness    Comorbidities hernia repair and inguinal hernia repair 04/10/2021; vaginal hysterectomy; COPD; IBS; Chronic kidney disease    Examination-Activity Limitations Stand;Toileting;Continence;Locomotion Level;Lift    Examination-Participation Restrictions Community Activity;Cleaning    Stability/Clinical Decision Making Evolving/Moderate complexity    Rehab Potential Good    PT Frequency 2x / week    PT Duration 12 weeks    PT Treatment/Interventions ADLs/Self Care Home Management;Biofeedback;Therapeutic activities;Therapeutic exercise;Neuromuscular re-education;Patient/family education;Manual techniques;Taping;Joint Manipulations;Spinal Manipulations    PT Next Visit Plan manual work to the anus on the left and facilitate the sphincter to contract; rib work to improve diaphragm mobility; abdminal massage and give as HEP; see how toileting is    PT Home Exercise Plan Access Code: [email or web address]    Recommended Other Services MD signed initial summary    Consulted and Agree with Plan of Care Patient             Patient will benefit from skilled  therapeutic intervention in order to improve the following deficits and impairments:  Decreased coordination, Increased fascial restricitons, Decreased endurance, Decreased activity tolerance, Decreased strength  Visit Diagnosis: Muscle weakness (generalized)  Unspecified lack of coordination     Problem List Patient Active Problem List   Diagnosis Date Noted   Incontinence of feces 12/23/2020   Interstitial cystitis 06/14/2020   Overweight (BMI 25.0-29.9) 06/13/2020   Former smoker (40 pack year, quit 2004) 08/30/2019   Atherosclerosis of aorta ([place]) 03/22/2019   Emphysema lung ([place])    Chronic diastolic CHF (congestive heart failure) ([place]) 03/11/2019   Osteopenia 01/16/2019   History of colon polyps 06/28/2018   CKD Stage 3 ([place]) 01/10/2015   IBS  01/10/2015   Hyperlipidemia, mixed 06/12/2014   Medication management 06/12/2014   LBBB (left bundle branch block) 07/24/2013   HTN (hypertension) 07/24/2013   Major depression, recurrent, full remission ([place])    OCD (obsessive compulsive disorder)    Abnormal glucose    Migraines    Vitamin D deficiency    Hypothyroidism 04/05/2013    Earlie Counts, PT 05/23/21 10:18 AM   [facility] @ [facility] [place], [place], [address] Phone: [phone]   Fax:  [phone]  Name: [name] MRN: [id number] Date of Birth: Dec 20, 1950

## 2021-05-26 ENCOUNTER — Telehealth: Payer: Self-pay

## 2021-05-26 DIAGNOSIS — E039 Hypothyroidism, unspecified: Secondary | ICD-10-CM | POA: Diagnosis not present

## 2021-05-26 DIAGNOSIS — I5032 Chronic diastolic (congestive) heart failure: Secondary | ICD-10-CM | POA: Diagnosis not present

## 2021-05-26 DIAGNOSIS — I10 Essential (primary) hypertension: Secondary | ICD-10-CM | POA: Diagnosis not present

## 2021-05-26 NOTE — Telephone Encounter (Signed)
[name] 03/06/1951 RPM Review  05/24/2021 Saturday at 09:01 AM 166      05/23/2021 Friday at 08:55 AM 166      05/22/2021 Thursday at 08:49 AM 165.1      05/21/2021 Wednesday at 12:01 PM 166.2      05/20/2021 Tuesday at 10:24 PM 167.3      05/19/2021 Monday at 11:16 PM 169.1      05/18/2021 Sunday at 09:30 AM 165.3      05/17/2021 Saturday at 10:02 PM 167.6      05/16/2021 Friday at 08:03 AM 165.3      05/15/2021 Thursday at 08:41 PM 167.1      05/15/2021 Thursday at 12:25 PM 166      05/15/2021 Thursday at 12:25 PM 166      05/15/2021 Thursday at 12:25 PM 166      05/15/2021 Thursday at 11:48 AM 165.1      05/15/2021 Thursday at 11:46 AM 164.9      05/15/2021 Thursday at 11:43 AM 165.3      05/15/2021 Thursday at 11:03 AM 165.3      05/15/2021 Thursday at 11:00 AM 165.3      05/15/2021 Thursday at 10:59 AM 165.3      05/15/2021 Thursday at 10:43 AM 165.1      05/15/2021 Thursday at 10:39 AM 165.1      05/15/2021 Thursday at 10:38 AM 165.1      05/15/2021 Thursday at 10:37 AM 165.1      05/14/2021 Wednesday at 01:28 PM 165.6      05/13/2021 Tuesday at 11:17 AM 165.3      05/12/2021 Monday at 08:25 AM 169.5      05/10/2021 Saturday at 10:31 AM 169.3      05/09/2021 Friday at 09:31 AM 166.4      05/09/2021 Friday at 09:31 AM 166.4      05/08/2021 Thursday at 10:21 AM 165.6      05/07/2021 Wednesday at 09:23 AM 167.1      05/05/2021 Monday at 11:49 AM 168.4      05/04/2021 Sunday at 07:51 AM 165.8      05/03/2021 Saturday at 09:29 AM 166      05/02/2021 Friday at 09:14 AM 166      05/01/2021 Thursday at 08:20 AM 166      04/30/2021 Wednesday at 10:24 AM 168.2      04/29/2021 Tuesday at 11:09 AM 169.1      04/28/2021 Monday at 08:24 AM 168      01 /04/2021 Sunday at 08:32 AM 166.4  [name], [facility] [phone]

## 2021-05-28 ENCOUNTER — Ambulatory Visit: Payer: PPO | Attending: Surgery | Admitting: Physical Therapy

## 2021-05-28 ENCOUNTER — Other Ambulatory Visit: Payer: Self-pay

## 2021-05-28 ENCOUNTER — Encounter: Payer: Self-pay | Admitting: Physical Therapy

## 2021-05-28 DIAGNOSIS — R279 Unspecified lack of coordination: Secondary | ICD-10-CM | POA: Diagnosis not present

## 2021-05-28 DIAGNOSIS — M6281 Muscle weakness (generalized): Secondary | ICD-10-CM | POA: Diagnosis not present

## 2021-05-28 NOTE — Patient Instructions (Addendum)
About Abdominal Massage  Abdominal massage, also called external colon massage, is a self-treatment circular massage technique that can reduce and eliminate gas and ease constipation. The colon naturally contracts in waves in a clockwise direction starting from inside the right hip, moving up toward the ribs, across the belly, and down inside the left hip.  When you perform circular abdominal massage, you help stimulate your colons normal wave pattern of movement called peristalsis.  It is most beneficial when done after eating.  Positioning You can practice abdominal massage with oil while lying down, or in the shower with soap.  Some people find that it is just as effective to do the massage through clothing while sitting or standing.  How to Massage Start by placing your finger tips or knuckles on your right side, just inside your hip bone.  Make small circular movements while you move upward toward your rib cage.   Once you reach the bottom right side of your rib cage, take your circular movements across to the left side of the bottom of your rib cage.  Next, move downward until you reach the inside of your left hip bone.  This is the path your feces travel in your colon. Continue to perform your abdominal massage in this pattern for 10 minutes each day.     You can apply as much pressure as is comfortable in your massage.  Start gently and build pressure as you continue to practice.  Notice any areas of pain as you massage; areas of slight pain may be relieved as you massage, but if you have areas of significant or intense pain, consult with your healthcare provider.  Other Considerations General physical activity including bending and stretching can have a beneficial massage-like effect on the colon.  Deep breathing can also stimulate the colon because breathing deeply activates the same nervous system that supplies the colon.   Abdominal massage should always be used in combination with a  bowel-conscious diet that is high in the proper type of fiber for you, fluids (primarily water), and a regular exercise program.  Access Code: [email or web address] URL: [email or web address] Date: 05/28/2021 Prepared by: Earlie Counts  Exercises Supine Diaphragmatic Breathing - 2 x daily - 7 x weekly - 1 sets - 10 reps Seated Diaphragmatic Breathing - 3 x daily - 7 x weekly - 1 sets - 10 reps Positioning on Toilet and Defecation Technique - 1 x daily - 7 x weekly - 3 sets - 10 reps Hooklying Transversus Abdominis Palpation - 1 x daily - 7 x weekly - 3 sets - 10 reps  [facility] [address], [place], [place] [address] Phone # [phone] Fax [phone]

## 2021-05-28 NOTE — Therapy (Signed)
[facility] @ [facility] [place], [place], [address] Phone: [phone]   Fax:  [phone]  Physical Therapy Treatment  Patient Details  Name: [name] MRN: [id number] Date of Birth: [birth year] Referring Provider (PT): Dr. Michael Boston   Encounter Date: 05/28/2021   PT End of Session - 05/28/21 1055     Visit Number 3    Date for PT Re-Evaluation 08/04/21    Authorization Type healthteam    PT Start Time 1015    PT Stop Time 1055    PT Time Calculation (min) 40 min    Activity Tolerance Patient tolerated treatment well    Behavior During Therapy [facility] for tasks assessed/performed             Past Medical History:  Diagnosis Date   ADHD (attention deficit hyperactivity disorder)    Chronic diastolic CHF (congestive heart failure) ([place])    cardriologist--- dr Viona Gilmore. Audie Box;   dx in setting sepsis 11/ 2020;   echo in epic 03-10-2019 ef 55-60%, G1DD, mild to moderate TR no stenosis and normal RASP;  CTA 01-31-2021 epic   Chronic kidney disease (CKD), stage III (moderate) ([place])    nephrologist--- dr Amedeo Gory   Depression    Diverticulosis of colon    Emphysema of lung ([place])    no home oxygen   GERD (gastroesophageal reflux disease)    Hiatal hernia    History of acute pyelonephritis    w/ hx sepsis   History of Helicobacter pylori infection 2012   History of recurrent UTIs    History of TIA (transient ischemic attack)    TIAs   Hyperlipidemia, mixed    Hypertension    followed by pcp and nephrologist----  (nuclear stress test in epic 06-28-2015 low risk no ischemia, nuclear ef 60%)   Hypothyroidism    IBS (irritable bowel syndrome)    IC (interstitial cystitis)    remote hx   Inguinal hernia, bilateral    Irritable bowel syndrome with constipation and diarrhea    LBBB (left bundle branch block) 2015   cardiologist --- dr Audie Box;  known chronic LBBB for several yrs   Migraines    OCD (obsessive compulsive  disorder)    OSA (obstructive sleep apnea)    no CPAP intolerant   Osteopenia    Prolapsed hemorrhoids    Seasonal allergies    Vitamin D deficiency     Past Surgical History:  Procedure Laterality Date   BREAST ENHANCEMENT SURGERY Bilateral    1970s   BREAST IMPLANT REMOVAL Bilateral 03/07/2019   BUNIONECTOMY Bilateral 1997   CATARACT EXTRACTION W/ INTRAOCULAR LENS IMPLANT Right 2005   COLONOSCOPY  10/2020   ESOPHAGOGASTRODUODENOSCOPY (EGD) WITH PROPOFOL N/A 03/12/2019   Procedure: ESOPHAGOGASTRODUODENOSCOPY (EGD) WITH PROPOFOL;  Surgeon: Carol Ada, MD;  Location: [facility];  Service: Endoscopy;  Laterality: N/A;   HEMORRHOID SURGERY  1976   INGUINAL HERNIA REPAIR N/A 04/10/2021   Procedure: LAPAROSCOPIC LEFT INGUINAL HERNIA REPAIR AND RIGHT INGUINAL HERNII REPAIR WITH TAP BLOCK;  Surgeon: Michael Boston, MD;  Location: [facility];  Service: General;  Laterality: N/A;   ORIF ANKLE FRACTURE Left 08/30/2017   Procedure: OPEN REDUCTION INTERNAL FIXATION (ORIF) ANKLE FRACTURE;  Surgeon: Rod Can, MD;  Location: [facility];  Service: Orthopedics;  Laterality: Left;   TONSILLECTOMY     CHILD    There were no vitals filed for this visit.   Subjective Assessment - 05/28/21 1021  Subjective The diarrhea is better. The toileting techniques help sometimes. The fecal incontinence is a little better. When sit to stand need to urinate and rarely make it.    Patient Stated Goals Have control over the bowels and bladder    Currently in Pain? No/denies    Multiple Pain Sites No                               [facility] Adult PT Treatment/Exercise - 05/28/21 0001       Lumbar Exercises: Supine   Ab Set 10 reps;5 seconds    AB Set Limitations with diaphragmatic breathing and sqeezing a ball to engage the pelvic floor      Lumbar Exercises: Sidelying   Other Sidelying Lumbar Exercises sidely diaphragmatic breathing with opening of the back and  filling the abdomen to prepare for toileting      Manual Therapy   Manual Therapy Soft tissue mobilization    Manual therapy comments educated patient about abdominal massage and she returned demonstration    Soft tissue mobilization maual mobilization of the abdominal muscles    Myofascial Release release of the lright lower abdomen in sidely while moving the top leg, release of the lower abdomen in supine to improve fascial releases                     PT Education - 05/28/21 1054     Education Details Access Code: [email or web address]; abdominal massage    Person(s) Educated Patient    Methods Explanation;Demonstration;Handout    Comprehension Returned demonstration;Verbalized understanding              PT Short Term Goals - 05/28/21 1045       PT SHORT TERM GOAL #1   Title independent iwth initial HEP for core and pelvic floor    Time 4    Period Weeks    Status On-going      PT SHORT TERM GOAL #2   Title able to perfrom a full circular contraction of the anus and vagina    Time 4    Period Weeks    Status On-going      PT SHORT TERM GOAL #3   Title understand ways to add fiber to increase stool consistency    Time 4    Period Weeks    Status Achieved               PT Long Term Goals - 05/12/21 1658       PT LONG TERM GOAL #1   Title independent with advanced HEP for core and pelvic floor strength    Time 12    Period Weeks    Status New    Target Date 08/04/21      PT LONG TERM GOAL #2   Title wears </= 1 pad per day and night due to increased pelvic floor strength and reduction of leakage    Time 12    Period Weeks    Status New    Target Date 08/04/21      PT LONG TERM GOAL #3   Title able to push her hterapist finger out of the rectum so she will be able to push her stool out without a struggle    Time 12    Period Weeks    Status New    Target Date 08/04/21      PT LONG TERM GOAL #  4   Title rectal strength >/= 3/5 so her stool  leakage is decreased >?= 75%    Time 12    Period Weeks    Status New    Target Date 08/04/21      PT LONG TERM GOAL #5   Title vaginal strength >/= 3/5 to reduce her urinary leakage >/= 60%    Time 12    Period Weeks    Status New    Target Date 08/04/21                   Plan - 05/28/21 1056     Clinical Impression Statement Patient will leak urine when she is going from sit to stand. She is doing better with toileting. Patient has less fascial restrictions in the abdomen but needs some more manual work on the mid abdomen and suprapubicall. Patient needs verbal and tactile cues to incorporate the lower abdominals more. Patient is able to expand the abdomen and back with diaphragmatic breathing. Patient is eating enough fiber. Patient will benefit from skilled therapy to improve pelvic floor strength and coordination to reduce her leakage.    Personal Factors and Comorbidities Age;Comorbidity 3+;Fitness    Comorbidities hernia repair and inguinal hernia repair 04/10/2021; vaginal hysterectomy; COPD; IBS; Chronic kidney disease    Examination-Activity Limitations Stand;Toileting;Continence;Locomotion Level;Lift    Examination-Participation Restrictions Community Activity;Cleaning    Stability/Clinical Decision Making Evolving/Moderate complexity    Rehab Potential Good    PT Frequency 2x / week    PT Duration 12 weeks    PT Treatment/Interventions ADLs/Self Care Home Management;Biofeedback;Therapeutic activities;Therapeutic exercise;Neuromuscular re-education;Patient/family education;Manual techniques;Taping;Joint Manipulations;Spinal Manipulations    PT Next Visit Plan continue with abdominal massage; add bridges, supine press hands into mat to work on abdominals, work on sit to stand with pelvic floor contraction    PT Home Exercise Plan Access Code: [email or web address]    Consulted and Agree with Plan of Care Patient             Patient will benefit from skilled therapeutic  intervention in order to improve the following deficits and impairments:  Decreased coordination, Increased fascial restricitons, Decreased endurance, Decreased activity tolerance, Decreased strength  Visit Diagnosis: Muscle weakness (generalized)  Unspecified lack of coordination     Problem List Patient Active Problem List   Diagnosis Date Noted   Incontinence of feces 12/23/2020   Interstitial cystitis 06/14/2020   Overweight (BMI 25.0-29.9) 06/13/2020   Former smoker (40 pack year, quit 2004) 08/30/2019   Atherosclerosis of aorta ([place]) 03/22/2019   Emphysema lung ([place])    Chronic diastolic CHF (congestive heart failure) ([place]) 03/11/2019   Osteopenia 01/16/2019   History of colon polyps 06/28/2018   CKD Stage 3 ([place]) 01/10/2015   IBS  01/10/2015   Hyperlipidemia, mixed 06/12/2014   Medication management 06/12/2014   LBBB (left bundle branch block) 07/24/2013   HTN (hypertension) 07/24/2013   Major depression, recurrent, full remission ([place])    OCD (obsessive compulsive disorder)    Abnormal glucose    Migraines    Vitamin D deficiency    Hypothyroidism 04/05/2013    Earlie Counts, PT 05/28/21 11:01 AM  [facility] @ [facility] [place], [place], [address] Phone: [phone]   Fax:  [phone]  Name: [name] MRN: [id number] Date of Birth: [birth year]

## 2021-05-29 ENCOUNTER — Other Ambulatory Visit (HOSPITAL_COMMUNITY): Payer: Self-pay

## 2021-06-02 ENCOUNTER — Ambulatory Visit: Payer: PPO | Admitting: Physical Therapy

## 2021-06-02 ENCOUNTER — Other Ambulatory Visit: Payer: Self-pay

## 2021-06-02 ENCOUNTER — Encounter: Payer: Self-pay | Admitting: Physical Therapy

## 2021-06-02 DIAGNOSIS — M6281 Muscle weakness (generalized): Secondary | ICD-10-CM | POA: Diagnosis not present

## 2021-06-02 DIAGNOSIS — R279 Unspecified lack of coordination: Secondary | ICD-10-CM

## 2021-06-02 NOTE — Patient Instructions (Signed)
Access Code: [email or web address] URL: [email or web address] Date: 06/02/2021 Prepared by: Earlie Counts  Exercises Supine Diaphragmatic Breathing - 2 x daily - 7 x weekly - 1 sets - 10 reps Seated Diaphragmatic Breathing - 3 x daily - 7 x weekly - 1 sets - 10 reps Positioning on Toilet and Defecation Technique - 1 x daily - 7 x weekly - 3 sets - 10 reps Hooklying Transversus Abdominis Palpation - 1 x daily - 7 x weekly - 3 sets - 10 reps Supine Bridge with Mini Swiss Ball Between Knees - 1 x daily - 7 x weekly - 1 sets - 10 reps Hooklying Isometric Hip Flexion - 1 x daily - 7 x weekly - 1 sets - 10 reps Seated Pelvic Floor Contraction - 3 x daily - 7 x weekly - 1 sets - 10 reps - 10 hold Seated Quick Flick Pelvic Floor Contractions - 1 x daily - 7 x weekly - 1 sets - 5 reps [facility] [address] [place], [place] [address] Phone # [phone] Fax [phone]

## 2021-06-02 NOTE — Therapy (Signed)
[facility] @ [address] [place], [place], [address] Phone: [phone]   Fax:  [phone]  Physical Therapy Treatment  Patient Details  Name: [name] MRN: [id number] Date of Birth: 1950-11-29 Referring Provider (PT): Dr. Michael Boston   Encounter Date: 06/02/2021   PT End of Session - 06/02/21 1454     Visit Number 4    Date for PT Re-Evaluation 08/04/21    Authorization Type healthteam    PT Start Time 1445    PT Stop Time 1525    PT Time Calculation (min) 40 min    Activity Tolerance Patient tolerated treatment well    Behavior During Therapy [facility] for tasks assessed/performed             Past Medical History:  Diagnosis Date   ADHD (attention deficit hyperactivity disorder)    Chronic diastolic CHF (congestive heart failure) ([place])    cardriologist--- dr Viona Gilmore. Audie Box;   dx in setting sepsis 11/ 2020;   echo in epic 03-10-2019 ef 55-60%, G1DD, mild to moderate TR no stenosis and normal RASP;  CTA 01-31-2021 epic   Chronic kidney disease (CKD), stage III (moderate) ([place])    nephrologist--- dr Amedeo Gory   Depression    Diverticulosis of colon    Emphysema of lung ([place])    no home oxygen   GERD (gastroesophageal reflux disease)    Hiatal hernia    History of acute pyelonephritis    w/ hx sepsis   History of Helicobacter pylori infection 2012   History of recurrent UTIs    History of TIA (transient ischemic attack)    TIAs   Hyperlipidemia, mixed    Hypertension    followed by pcp and nephrologist----  (nuclear stress test in epic 06-28-2015 low risk no ischemia, nuclear ef 60%)   Hypothyroidism    IBS (irritable bowel syndrome)    IC (interstitial cystitis)    remote hx   Inguinal hernia, bilateral    Irritable bowel syndrome with constipation and diarrhea    LBBB (left bundle branch block) 2015   cardiologist --- dr Audie Box;  known chronic LBBB for several yrs   Migraines    OCD (obsessive compulsive  disorder)    OSA (obstructive sleep apnea)    no CPAP intolerant   Osteopenia    Prolapsed hemorrhoids    Seasonal allergies    Vitamin D deficiency     Past Surgical History:  Procedure Laterality Date   BREAST ENHANCEMENT SURGERY Bilateral    1970s   BREAST IMPLANT REMOVAL Bilateral 03/07/2019   BUNIONECTOMY Bilateral 1997   CATARACT EXTRACTION W/ INTRAOCULAR LENS IMPLANT Right 2005   COLONOSCOPY  10/2020   ESOPHAGOGASTRODUODENOSCOPY (EGD) WITH PROPOFOL N/A 03/12/2019   Procedure: ESOPHAGOGASTRODUODENOSCOPY (EGD) WITH PROPOFOL;  Surgeon: Carol Ada, MD;  Location: [place];  Service: Endoscopy;  Laterality: N/A;   HEMORRHOID SURGERY  1976   INGUINAL HERNIA REPAIR N/A 04/10/2021   Procedure: LAPAROSCOPIC LEFT INGUINAL HERNIA REPAIR AND RIGHT INGUINAL HERNII REPAIR WITH TAP BLOCK;  Surgeon: Michael Boston, MD;  Location: [facility];  Service: General;  Laterality: N/A;   ORIF ANKLE FRACTURE Left 08/30/2017   Procedure: OPEN REDUCTION INTERNAL FIXATION (ORIF) ANKLE FRACTURE;  Surgeon: Rod Can, MD;  Location: [facility];  Service: Orthopedics;  Laterality: Left;   TONSILLECTOMY     CHILD    There were no vitals filed for this visit.   Subjective Assessment - 06/02/21 1454  Subjective My anal region is so sore due to the diarrhea. I am tired of the leakage.    Patient Stated Goals Have control over the bowels and bladder    Currently in Pain? No/denies    Multiple Pain Sites No                               [facility] Adult PT Treatment/Exercise - 06/02/21 0001       Self-Care   Self-Care Other Self-Care Comments    Other Self-Care Comments  discussed with patient on rectal care with spraying water on the rectum to reduce irritation, using ointment on the area      Therapeutic Activites    Therapeutic Activities Other Therapeutic Activities    Other Therapeutic Activities after she toilets she was instructed to contract the anus  so it does not feel so open and reduce her leakage      Lumbar Exercises: Aerobic   Nustep level 4 for 6 minutes while assess patient      Lumbar Exercises: Seated   Other Seated Lumbar Exercises seated pelvic floor contraction holding for 10 seconds 59D thne 5 quick flicks      Lumbar Exercises: Supine   Bridge with [employer] 20 reps    Bridge with [employer] Limitations with anal contraction    Isometric Hip Flexion 20 reps;3 seconds    Isometric Hip Flexion Limitations wth anal contraction                     PT Education - 06/02/21 1517     Education Details Access Code: [email or web address]    Person(s) Educated Patient    Methods Explanation;Demonstration;Verbal cues;Handout    Comprehension Returned demonstration;Verbalized understanding              PT Short Term Goals - 06/02/21 1534       PT SHORT TERM GOAL #1   Title independent with initial HEP for core and pelvic floor    Time 4    Period Weeks    Status Achieved      PT SHORT TERM GOAL #2   Title able to perfrom a full circular contraction of the anus and vagina    Time 4    Period Weeks    Status On-going    Target Date 06/09/21      PT SHORT TERM GOAL #3   Title understand ways to add fiber to increase stool consistency    Time 4    Period Weeks    Status Achieved               PT Long Term Goals - 05/12/21 1658       PT LONG TERM GOAL #1   Title independent with advanced HEP for core and pelvic floor strength    Time 12    Period Weeks    Status New    Target Date 08/04/21      PT LONG TERM GOAL #2   Title wears </= 1 pad per day and night due to increased pelvic floor strength and reduction of leakage    Time 12    Period Weeks    Status New    Target Date 08/04/21      PT LONG TERM GOAL #3   Title able to push her hterapist finger out of the rectum so she will be able to push her stool  out without a struggle    Time 12    Period Weeks    Status New    Target Date  08/04/21      PT LONG TERM GOAL #4   Title rectal strength >/= 3/5 so her stool leakage is decreased >?= 75%    Time 12    Period Weeks    Status New    Target Date 08/04/21      PT LONG TERM GOAL #5   Title vaginal strength >/= 3/5 to reduce her urinary leakage >/= 60%    Time 12    Period Weeks    Status New    Target Date 08/04/21                   Plan - 06/02/21 1528     Clinical Impression Statement Patient reports she is having loose stools and her rectum is sore from it. She is using preparation H on the rectal area and wet wipes to reduce the irritation. She is able to feel the rectum contract but not the lift. Patient will benefit from skilled therapy to improve pelvic floor strength and coordination to reduce her leakage.    Personal Factors and Comorbidities Age;Comorbidity 3+;Fitness    Comorbidities hernia repair and inguinal hernia repair 04/10/2021; vaginal hysterectomy; COPD; IBS; Chronic kidney disease    Examination-Activity Limitations Stand;Toileting;Continence;Locomotion Level;Lift    Examination-Participation Restrictions Community Activity;Cleaning    Stability/Clinical Decision Making Evolving/Moderate complexity    Rehab Potential Good    PT Frequency 2x / week    PT Duration 12 weeks    PT Treatment/Interventions ADLs/Self Care Home Management;Biofeedback;Therapeutic activities;Therapeutic exercise;Neuromuscular re-education;Patient/family education;Manual techniques;Taping;Joint Manipulations;Spinal Manipulations    PT Next Visit Plan work on core work and pelvic floor contraction; sit to stand with pelvic floor contraction, stand with marching; check the rectal and vaginal contraction and see if circular    PT Home Exercise Plan Access Code: [email or web address]    Consulted and Agree with Plan of Care Patient             Patient will benefit from skilled therapeutic intervention in order to improve the following deficits and impairments:  Decreased  coordination, Increased fascial restricitons, Decreased endurance, Decreased activity tolerance, Decreased strength  Visit Diagnosis: Muscle weakness (generalized)  Unspecified lack of coordination     Problem List Patient Active Problem List   Diagnosis Date Noted   Incontinence of feces 12/23/2020   Interstitial cystitis 06/14/2020   Overweight (BMI 25.0-29.9) 06/13/2020   Former smoker (40 pack year, quit 2004) 08/30/2019   Atherosclerosis of aorta ([place]) 03/22/2019   Emphysema lung ([place])    Chronic diastolic CHF (congestive heart failure) ([place]) 03/11/2019   Osteopenia 01/16/2019   History of colon polyps 06/28/2018   CKD Stage 3 ([place]) 01/10/2015   IBS  01/10/2015   Hyperlipidemia, mixed 06/12/2014   Medication management 06/12/2014   LBBB (left bundle branch block) 07/24/2013   HTN (hypertension) 07/24/2013   Major depression, recurrent, full remission ([facility])    OCD (obsessive compulsive disorder)    Abnormal glucose    Migraines    Vitamin D deficiency    Hypothyroidism 04/05/2013    Earlie Counts, PT 06/02/21 3:36 PM  [facility] @ [address] [place], [place], [address] Phone: [phone]   Fax:  [phone]  Name: [name] MRN: [id number] Date of Birth: 01/21/1951

## 2021-06-04 ENCOUNTER — Other Ambulatory Visit (HOSPITAL_COMMUNITY): Payer: Self-pay

## 2021-06-09 ENCOUNTER — Ambulatory Visit: Payer: PPO | Admitting: Physical Therapy

## 2021-06-09 ENCOUNTER — Other Ambulatory Visit: Payer: Self-pay

## 2021-06-09 ENCOUNTER — Encounter: Payer: Self-pay | Admitting: Physical Therapy

## 2021-06-09 DIAGNOSIS — R279 Unspecified lack of coordination: Secondary | ICD-10-CM

## 2021-06-09 DIAGNOSIS — M6281 Muscle weakness (generalized): Secondary | ICD-10-CM

## 2021-06-09 NOTE — Patient Instructions (Signed)
Access Code: [email or web address] URL: [email or web address] Date: 06/09/2021 Prepared by: Earlie Counts  Exercises Supine Diaphragmatic Breathing - 2 x daily - 7 x weekly - 1 sets - 10 reps Seated Diaphragmatic Breathing - 3 x daily - 7 x weekly - 1 sets - 10 reps Positioning on Toilet and Defecation Technique - 1 x daily - 7 x weekly - 3 sets - 10 reps Hooklying Transversus Abdominis Palpation - 1 x daily - 7 x weekly - 3 sets - 10 reps Supine Bridge with Mini Swiss Ball Between Knees - 1 x daily - 7 x weekly - 1 sets - 10 reps Hooklying Isometric Hip Flexion - 1 x daily - 7 x weekly - 1 sets - 10 reps Seated Pelvic Floor Contraction - 3 x daily - 7 x weekly - 1 sets - 10 reps - 10 hold Seated Quick Flick Pelvic Floor Contractions - 1 x daily - 7 x weekly - 1 sets - 5 reps Supine Transversus Abdominis Bracing - Hands on Ground - 1 x daily - 7 x weekly - 1 sets - 10 reps - 5 sec hold [facility] [address] [place], [place] [address] Phone # [phone] Fax [phone]

## 2021-06-09 NOTE — Therapy (Signed)
[facility] @ [address] [place], [place], [address] Phone: [phone]   Fax:  [phone]  Physical Therapy Treatment  Patient Details  Name: [name] MRN: [id number] Date of Birth: 1950-07-06 Referring Provider (PT): Dr. Michael Boston   Encounter Date: 06/09/2021   PT End of Session - 06/09/21 1150     Visit Number 5    Date for PT Re-Evaluation 08/04/21    Authorization Type healthteam    PT Start Time 1145    PT Stop Time 1225    PT Time Calculation (min) 40 min    Activity Tolerance Patient tolerated treatment well    Behavior During Therapy [facility] for tasks assessed/performed             Past Medical History:  Diagnosis Date   ADHD (attention deficit hyperactivity disorder)    Chronic diastolic CHF (congestive heart failure) ([place])    cardriologist--- dr Viona Gilmore. Audie Box;   dx in setting sepsis 11/ 2020;   echo in epic 03-10-2019 ef 55-60%, G1DD, mild to moderate TR no stenosis and normal RASP;  CTA 01-31-2021 epic   Chronic kidney disease (CKD), stage III (moderate) ([place])    nephrologist--- dr Amedeo Gory   Depression    Diverticulosis of colon    Emphysema of lung ([place])    no home oxygen   GERD (gastroesophageal reflux disease)    Hiatal hernia    History of acute pyelonephritis    w/ hx sepsis   History of Helicobacter pylori infection 2012   History of recurrent UTIs    History of TIA (transient ischemic attack)    TIAs   Hyperlipidemia, mixed    Hypertension    followed by pcp and nephrologist----  (nuclear stress test in epic 06-28-2015 low risk no ischemia, nuclear ef 60%)   Hypothyroidism    IBS (irritable bowel syndrome)    IC (interstitial cystitis)    remote hx   Inguinal hernia, bilateral    Irritable bowel syndrome with constipation and diarrhea    LBBB (left bundle branch block) 2015   cardiologist --- dr Audie Box;  known chronic LBBB for several yrs   Migraines    OCD (obsessive  compulsive disorder)    OSA (obstructive sleep apnea)    no CPAP intolerant   Osteopenia    Prolapsed hemorrhoids    Seasonal allergies    Vitamin D deficiency     Past Surgical History:  Procedure Laterality Date   BREAST ENHANCEMENT SURGERY Bilateral    1970s   BREAST IMPLANT REMOVAL Bilateral 03/07/2019   BUNIONECTOMY Bilateral 1997   CATARACT EXTRACTION W/ INTRAOCULAR LENS IMPLANT Right 2005   COLONOSCOPY  10/2020   ESOPHAGOGASTRODUODENOSCOPY (EGD) WITH PROPOFOL N/A 03/12/2019   Procedure: ESOPHAGOGASTRODUODENOSCOPY (EGD) WITH PROPOFOL;  Surgeon: Carol Ada, MD;  Location: [facility];  Service: Endoscopy;  Laterality: N/A;   HEMORRHOID SURGERY  1976   INGUINAL HERNIA REPAIR N/A 04/10/2021   Procedure: LAPAROSCOPIC LEFT INGUINAL HERNIA REPAIR AND RIGHT INGUINAL HERNII REPAIR WITH TAP BLOCK;  Surgeon: Michael Boston, MD;  Location: [facility];  Service: General;  Laterality: N/A;   ORIF ANKLE FRACTURE Left 08/30/2017   Procedure: OPEN REDUCTION INTERNAL FIXATION (ORIF) ANKLE FRACTURE;  Surgeon: Rod Can, MD;  Location: [facility];  Service: Orthopedics;  Laterality: Left;   TONSILLECTOMY     CHILD    There were no vitals filed for this visit.   Subjective Assessment - 06/09/21 1152  Subjective Sometimes I can delay the BM.    Patient Stated Goals Have control over the bowels and bladder    Currently in Pain? No/denies    Multiple Pain Sites No                            Pelvic Floor Special Questions - 06/09/21 0001     Pelvic Floor Internal Exam Patient confirms identification and approves PT to assess pelvic floor and treatment    Exam Type Rectal    Strength fair squeeze, definite lift   rectal with tactile cues to lift              [facility] Adult PT Treatment/Exercise - 06/09/21 0001       Neuro Re-ed    Neuro Re-ed Details  tactile cues to contract the pelvic floor and lift, when to contract her lower abdomen and  contract the pelvic floor; then practiced pushing the therapist finger out of the anal canal.      Lumbar Exercises: Aerobic   Nustep level 5 for 5 minutes while assess patient      Lumbar Exercises: Supine   Ab Set 15 reps;5 seconds    AB Set Limitations with ball squeze and pressing hands into the mat    Bridge with [employer] 20 reps    Bridge with [employer] Limitations with anal contraction    Isometric Hip Flexion 20 reps;3 seconds    Isometric Hip Flexion Limitations wth anal contraction      Manual Therapy   Manual Therapy Internal Pelvic Floor    Internal Pelvic Floor manual work to the left sphincter muscles                     PT Education - 06/09/21 1222     Education Details Access Code: [email or web address]    Person(s) Educated Patient    Methods Explanation;Demonstration;Verbal cues    Comprehension Returned demonstration;Verbalized understanding              PT Short Term Goals - 06/09/21 1227       PT SHORT TERM GOAL #1   Title independent with initial HEP for core and pelvic floor    Time 4    Period Weeks    Status Achieved      PT SHORT TERM GOAL #2   Title able to perfrom a full circular contraction of the anus and vagina    Baseline circular contraction of the anus    Time 4    Period Weeks    Status On-going      PT SHORT TERM GOAL #3   Title understand ways to add fiber to increase stool consistency    Time 4    Period Weeks    Status Achieved               PT Long Term Goals - 06/09/21 1159       PT LONG TERM GOAL #1   Title independent with advanced HEP for core and pelvic floor strength    Time 12    Period Weeks    Status On-going      PT LONG TERM GOAL #2   Title wears </= 1 pad per day and night due to increased pelvic floor strength and reduction of leakage    Baseline wears a long pad and one thin pad, changes them 6-7 times per day; will change  if  stool or urine is on the pad    Time 12    Period Weeks     Status On-going      PT LONG TERM GOAL #3   Title able to push her hterapist finger out of the rectum so she will be able to push her stool out without a struggle    Time 12    Period Weeks    Status On-going      PT LONG TERM GOAL #4   Title rectal strength >/= 3/5 so her stool leakage is decreased >/= 75%    Time 12    Period Weeks    Status On-going      PT LONG TERM GOAL #5   Title vaginal strength >/= 3/5 to reduce her urinary leakage >/= 60%    Time 12    Period Weeks    Status On-going                   Plan - 06/09/21 1223     Clinical Impression Statement Patient anal strength increased to 3/5 for 5 seconds but is not a strong circular contraction. Patient is able to open the anus but not push the therapist finger out of the canal. Patient is doing better with contracting the lower abdomen but will need verbal cues to not bulge her abdomen. Patient continues to use 6-7 pads per day due to urinary and fecal incontinence. Patient will benefit from skilled therapy to improve pelvic floor strength and coordination to reduce her leakage.    Personal Factors and Comorbidities Age;Comorbidity 3+;Fitness    Comorbidities hernia repair and inguinal hernia repair 04/10/2021; vaginal hysterectomy; COPD; IBS; Chronic kidney disease    Examination-Activity Limitations Stand;Toileting;Continence;Locomotion Level;Lift    Examination-Participation Restrictions Community Activity;Cleaning    Stability/Clinical Decision Making Evolving/Moderate complexity    Rehab Potential Good    PT Frequency 2x / week    PT Duration 12 weeks    PT Treatment/Interventions ADLs/Self Care Home Management;Biofeedback;Therapeutic activities;Therapeutic exercise;Neuromuscular re-education;Patient/family education;Manual techniques;Taping;Joint Manipulations;Spinal Manipulations    PT Next Visit Plan work on core work and pelvic floor contraction; sit to stand with pelvic floor contraction, stand with  marching;  vaginal contraction and see if circular    PT Home Exercise Plan Access Code: [email or web address]    Consulted and Agree with Plan of Care Patient             Patient will benefit from skilled therapeutic intervention in order to improve the following deficits and impairments:  Decreased coordination, Increased fascial restricitons, Decreased endurance, Decreased activity tolerance, Decreased strength  Visit Diagnosis: Muscle weakness (generalized)  Unspecified lack of coordination     Problem List Patient Active Problem List   Diagnosis Date Noted   Incontinence of feces 12/23/2020   Interstitial cystitis 06/14/2020   Overweight (BMI 25.0-29.9) 06/13/2020   Former smoker (40 pack year, quit 2004) 08/30/2019   Atherosclerosis of aorta ([place]) 03/22/2019   Emphysema lung ([place])    Chronic diastolic CHF (congestive heart failure) ([place]) 03/11/2019   Osteopenia 01/16/2019   History of colon polyps 06/28/2018   CKD Stage 3 ([place]) 01/10/2015   IBS  01/10/2015   Hyperlipidemia, mixed 06/12/2014   Medication management 06/12/2014   LBBB (left bundle branch block) 07/24/2013   HTN (hypertension) 07/24/2013   Major depression, recurrent, full remission ([facility])    OCD (obsessive compulsive disorder)    Abnormal glucose    Migraines    Vitamin D deficiency  Hypothyroidism 04/05/2013    Earlie Counts, PT 06/09/21 12:29 PM  [facility] @ [address] [place], [place], [address] Phone: [phone]   Fax:  [phone]  Name: [name] MRN: [id number] Date of Birth: 07/22/1950

## 2021-06-11 ENCOUNTER — Encounter: Payer: PPO | Admitting: Physical Therapy

## 2021-06-13 ENCOUNTER — Encounter: Payer: Self-pay | Admitting: Adult Health

## 2021-06-13 ENCOUNTER — Telehealth: Payer: Self-pay

## 2021-06-13 ENCOUNTER — Other Ambulatory Visit (HOSPITAL_COMMUNITY): Payer: Self-pay

## 2021-06-13 ENCOUNTER — Ambulatory Visit (INDEPENDENT_AMBULATORY_CARE_PROVIDER_SITE_OTHER): Payer: PPO | Admitting: Adult Health

## 2021-06-13 ENCOUNTER — Other Ambulatory Visit: Payer: Self-pay

## 2021-06-13 VITALS — BP 100/62 | HR 64 | Temp 97.5°F | Ht 62.5 in | Wt 166.6 lb

## 2021-06-13 DIAGNOSIS — E039 Hypothyroidism, unspecified: Secondary | ICD-10-CM

## 2021-06-13 DIAGNOSIS — I700 Atherosclerosis of aorta: Secondary | ICD-10-CM

## 2021-06-13 DIAGNOSIS — Z87891 Personal history of nicotine dependence: Secondary | ICD-10-CM | POA: Diagnosis not present

## 2021-06-13 DIAGNOSIS — I447 Left bundle-branch block, unspecified: Secondary | ICD-10-CM | POA: Diagnosis not present

## 2021-06-13 DIAGNOSIS — Z131 Encounter for screening for diabetes mellitus: Secondary | ICD-10-CM

## 2021-06-13 DIAGNOSIS — E663 Overweight: Secondary | ICD-10-CM

## 2021-06-13 DIAGNOSIS — Z79899 Other long term (current) drug therapy: Secondary | ICD-10-CM

## 2021-06-13 DIAGNOSIS — E559 Vitamin D deficiency, unspecified: Secondary | ICD-10-CM

## 2021-06-13 DIAGNOSIS — Z0000 Encounter for general adult medical examination without abnormal findings: Secondary | ICD-10-CM | POA: Diagnosis not present

## 2021-06-13 DIAGNOSIS — F3342 Major depressive disorder, recurrent, in full remission: Secondary | ICD-10-CM

## 2021-06-13 DIAGNOSIS — I10 Essential (primary) hypertension: Secondary | ICD-10-CM

## 2021-06-13 DIAGNOSIS — N1831 Chronic kidney disease, stage 3a: Secondary | ICD-10-CM | POA: Diagnosis not present

## 2021-06-13 DIAGNOSIS — E538 Deficiency of other specified B group vitamins: Secondary | ICD-10-CM

## 2021-06-13 DIAGNOSIS — E782 Mixed hyperlipidemia: Secondary | ICD-10-CM

## 2021-06-13 DIAGNOSIS — R7309 Other abnormal glucose: Secondary | ICD-10-CM | POA: Diagnosis not present

## 2021-06-13 DIAGNOSIS — Z0001 Encounter for general adult medical examination with abnormal findings: Secondary | ICD-10-CM

## 2021-06-13 DIAGNOSIS — K589 Irritable bowel syndrome without diarrhea: Secondary | ICD-10-CM

## 2021-06-13 DIAGNOSIS — F988 Other specified behavioral and emotional disorders with onset usually occurring in childhood and adolescence: Secondary | ICD-10-CM

## 2021-06-13 DIAGNOSIS — M8580 Other specified disorders of bone density and structure, unspecified site: Secondary | ICD-10-CM

## 2021-06-13 DIAGNOSIS — J432 Centrilobular emphysema: Secondary | ICD-10-CM

## 2021-06-13 DIAGNOSIS — Z136 Encounter for screening for cardiovascular disorders: Secondary | ICD-10-CM | POA: Diagnosis not present

## 2021-06-13 DIAGNOSIS — I5032 Chronic diastolic (congestive) heart failure: Secondary | ICD-10-CM

## 2021-06-13 DIAGNOSIS — N3010 Interstitial cystitis (chronic) without hematuria: Secondary | ICD-10-CM

## 2021-06-13 DIAGNOSIS — F32A Depression, unspecified: Secondary | ICD-10-CM

## 2021-06-13 DIAGNOSIS — Z86010 Personal history of colonic polyps: Secondary | ICD-10-CM

## 2021-06-13 MED ORDER — LEVOTHYROXINE SODIUM 125 MCG PO TABS
ORAL_TABLET | ORAL | 3 refills | Status: DC
  Filled 2021-06-13: qty 60, 84d supply, fill #0
  Filled 2021-09-04: qty 60, 84d supply, fill #1
  Filled 2021-12-02: qty 60, 84d supply, fill #2

## 2021-06-13 MED ORDER — BUPROPION HCL ER (XL) 150 MG PO TB24: 150.0000 mg | ORAL_TABLET | Freq: Every day | ORAL | 3 refills | Status: DC

## 2021-06-13 MED ORDER — ATENOLOL 50 MG PO TABS
ORAL_TABLET | ORAL | 3 refills | Status: DC
  Filled 2021-06-13: qty 90, 90d supply, fill #0

## 2021-06-13 NOTE — Telephone Encounter (Signed)
06/13/21-Called patient to schedule f/u visit. Unable to reach. [place].  Total time spent: 2 Minutes  06/16/21-Called patient and scheduled f/u visit for 09/09/21 at 2:30PM  06/24/21-RPM weight readings for February 06/24/2021 Tuesday at 08:51 AM 163.1      06/23/2021 Monday at 07:18 AM 164.7      06/22/2021 Sunday at 08:27 AM 163.8      06/21/2021 Saturday at 08:39 AM 164.7      06/20/2021 Friday at 07:46 AM 165.8      06/19/2021 Thursday at 02:29 PM 166.2      06/18/2021 Wednesday at 09:14 AM 166.2      06/17/2021 Tuesday at 08:34 AM 163.1      06/17/2021 Tuesday at 08:33 AM 163.1      06/16/2021 Monday at 09:06 AM 166.2      06/15/2021 Sunday at 03:43 PM 166.7      06/14/2021 Saturday at 07:56 AM 165.1      06/13/2021 Friday at 07:37 AM 164.9      06/12/2021 Thursday at 08:44 AM 165.8      06/11/2021 Wednesday at 08:16 PM 165.8      06/10/2021 Tuesday at 09:29 AM 165.8      06/09/2021 Monday at 08:57 AM 167.6      06/08/2021 Sunday at 10:01 AM 166      06/07/2021 Saturday at 07:04 PM 169.3      06/06/2021 Friday at 03:07 PM 169.5      06/05/2021 Thursday at 07:14 PM 171.1      06/04/2021 Wednesday at 11:44 AM 168.9      06/03/2021 Tuesday at 08:43 AM 168      06/02/2021 Monday at 04:52 PM 166.9      06/02/2021 Monday at 10:30 AM 166.4      06/01/2021 Sunday at 09:16 AM 167.3      05/31/2021 Saturday at 09:52 AM 166.9      05/29/2021 Thursday at 10:14 AM 166.4      02 /04/2021 Wednesday at 09:48 AM 167.3

## 2021-06-13 NOTE — Progress Notes (Signed)
CPE  Assessment:   Diagnoses and all orders for this visit:  Encounter for Annual Physical Exam with abnormal findings Due annually  Health Maintenance reviewed Healthy lifestyle reviewed and goals set  Atherosclerosis of aorta ([place]) Per Ct 2020 Control blood pressure, cholesterol, glucose, increase exercise.   Centrilobular emphysema ([place]) Per CT 2020, denies sx, monitor   Essential hypertension Well controlled; given atenolol 50 mg tabs; can try 1/2 tab atenolol if frequently <110/70 at home Monitor blood pressure at home; call if consistently over 130/80 Continue DASH diet.   Reminder to go to the ER if any CP, SOB, nausea, dizziness, severe HA, changes vision/speech, left arm numbness and tingling and jaw pain.  LBBB (left bundle branch block) Continue to monitor- NSCPT on EKG today  cardiology Dr. Acie Fredrickson fPRN per patient preference  Migraine without status migrainosus, not intractable, unspecified migraine type Reports improved recently;  No significant issues or concerns   Irritable bowel syndrome, unspecified type Symptoms stable/improved - continue monitoring diet/lifestyle modification  Hypothyroidism, unspecified type continue medications the same pending lab results reminded to take on an empty stomach 30-46mins before food.  -     TSH  CKD Stage 3 (HCC) Increase fluids, avoid NSAIDS, monitor sugars, will monitor Continue follow up with nephrology -     CMP/GFR -     UA/microalbumin   Major depression, recurrent, full remission ([place]) Continue medications; rare benzo; doing well with current regimen Lifestyle discussed: diet/exerise, sleep hygiene, stress management, hydration  Obsessive-compulsive disorder, unspecified type Symptoms improved on SSRI  Diverticulosis of intestine without bleeding, unspecified intestinal tract location Increase fiber intake;   Medication management -     CBC with Differential/Platelet -     CMP/GFR  Mixed  hyperlipidemia Fair control by lifestyle modification only Continue low cholesterol diet and exercise.  -     Lipid panel  Other abnormal glucose Recent A1Cs at goal Discussed disease and risks Discussed diet/exercise, weight management  -     CMP/GFR  Vitamin D deficiency At goal at recent check; continue to recommend supplementation for goal of 60-100 Check vitamin D level annually and after dose change  Osteopenia - get dexa q2y, has upcoming scheduled 2023, continue Vit D and Ca, weight bearing exercises  CHF, diastolic ([place]) Per ECHO while admitted 02/2019 No hx prior, denies notable sx since that time Dr. Johnsie Cancel recommended no dedicated follow up unless new sx Limit sodium; monitor weight and control BP Appears euvolemic  Hx of colon polyps Next colonoscopy due 10/2025 High fiber diet, low red meat/processed meat  Former smoker, 40 pack year, quit 2004 CT chest 08/2020 was benign, no further per radiology  Low threshold for CXR with sx, denies today   IC/pelvic floor dysfunction Has OV planned with uro/GYN, limited benefit with pelvic floor PT  Diarrhea/bristol 6 Try stopping supplements - stop miralax - try adding soluble fiber If improved can gradually resume supplements 1 at a time  Orders Placed This Encounter  Procedures   Vitamin B12   CBC with Differential/Platelet   COMPLETE METABOLIC PANEL WITH GFR   Magnesium   Lipid panel   TSH   Hemoglobin A1c   VITAMIN D 25 Hydroxy (Vit-D Deficiency, Fractures)   Microalbumin / creatinine urine ratio   Urinalysis, Routine w reflex microscopic   EKG 12-Lead    Over 40 minutes of exam, counseling, chart review and critical decision making was performed Future Appointments  Date Time Provider [place]  06/16/2021  9:30 AM  Earlie Counts F, PT [facility] None  06/23/2021  2:45 PM Monico Hoar, PT [facility] None  07/02/2021 10:15 AM Monico Hoar, PT [facility] None  07/04/2021  9:40 AM Jaquita Folds, MD [facility]  07/09/2021 11:00 AM Monico Hoar, PT [facility] None  07/16/2021 11:00 AM Monico Hoar, PT [facility] None  07/23/2021  8:45 AM Earlie Counts F, PT [facility] None  07/30/2021 11:45 AM Monico Hoar, PT [facility] None  11/18/2021  7:30 AM [facility] DX DEXA 1 [facility]  12/23/2021 10:30 AM Liane Comber, NP [facility] None  04/28/2022 11:00 AM Newton Pigg, RPH [facility] None  06/16/2022 10:00 AM Liane Comber, NP [facility] None     Plan:   During the course of the visit the patient was educated and counseled about appropriate screening and preventive services including:   Pneumococcal vaccine  Prevnar 13 Influenza vaccine Td vaccine Screening electrocardiogram Bone densitometry screening Colorectal cancer screening Diabetes screening Glaucoma screening Nutrition counseling  Advanced directives: requested   Subjective:  [name] is a 71 y.o. female who presents for AWV and follow up. She has Hypothyroidism; Major depression, recurrent, full remission ([place]); OCD (obsessive compulsive disorder); Abnormal glucose; Migraines; Vitamin D deficiency; LBBB (left bundle branch block); HTN (hypertension); Hyperlipidemia, mixed; Medication management; CKD Stage 3 ([place]); IBS ; History of colon polyps; Osteopenia; Chronic diastolic CHF (congestive heart failure) ([place]); Atherosclerosis of aorta ([place]); Emphysema lung ([place]); Former smoker (40 pack year, quit 2004); Overweight (BMI 25.0-29.9); Interstitial cystitis; and Incontinence of feces on their problem list.  She just had bil inguinal hernia repair by Dr. Johney Maine 04/10/2021, doing well with no issues.   She has IC, and pelvic floor dysfunction, has upcoming appointment with new urogyn Dr. Hilary Hertz, has been doing pelvic floor PT.   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   she has a  diagnosis of depression/anxiety/OCD and is currently on wellbutrin, sertraline and xanax 0.5 mg TID PRN, reports symptoms are well controlled on current regimen. she currently reports using xanax very rarely.  BMI is Body mass index is 29.99 kg/m., she has been working on diet, admits minimal intentional exercise hough very active around home and her yard for several hours weather permitting.  Wt Readings from Last 3 Encounters:  06/13/21 166 lb 9.6 oz (75.6 kg)  05/15/21 167 lb (75.8 kg)  04/10/21 168 lb 11.2 oz (76.5 kg)   She has aortic atherosclerosis and centrilobular emphysema per CT chest 02/2019, former smoker, quit in 2004, 40 pack year history. Inhalers only need when ill. 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   In November 2020,  patient had removal if breast implants and had post-op complications post intubation neck & facial swelling with fever & drop in O2 to 79% and all attributed to secondary to cricopharyngeal inflammation.  At this time she underwent Echo 03/10/2019 LVEF estimated 55-60%, Abnormal (paradoxical) septal motion, consistent with left bundle branch block (long history of this on EKG), Grade I diastolic dysfunctoin, she also had some elevated troponins felt to be demand related. Had follow up with Dr. Johnsie Cancel, was not recommended dedicated follow up. LBBB followed by Dr. Johnsie Cancel.    Her blood pressure has been controlled at home, today their BP is BP: 100/62 She does  not workout but is doing lots of intense housework. She denies chest pain, shortness of breath, dizziness.   She is not on cholesterol medication, mild elevations treated by lifestyle modification. Declines medication. Her cholesterol is not at goal. The cholesterol last visit was:   Lab Results  Component Value Date   CHOL 176 12/23/2020   HDL 63 12/23/2020   LDLCALC 94 12/23/2020   TRIG 97 12/23/2020   CHOLHDL 2.8 12/23/2020    She  has been working on diet and exercise for glucose management, and denies increased appetite, nausea, paresthesia of the feet, polydipsia, polyuria, visual disturbances and vomiting. Last A1C in the office was:  Lab Results  Component Value Date   HGBA1C 5.4 06/14/2020   She is on thyroid medication. Her medication was not changed last visit, taking 125 mcg daily Lab Results  Component Value Date   TSH 1.31 03/24/2021   She is followed by nephrology Dr. Katrine Coho for CKD III Lab Results  Component Value Date   TOIZTIWP 80 (L) 04/07/2021   Patient is on Vitamin D supplement and at goal at recent check:  Lab Results  Component Value Date   VD25OH 84 06/14/2020     Stopped B complex supplement last week Lab Results  Component Value Date   VITAMINB12 >2,000 (H) 06/14/2020     Medication Review: Current Outpatient Medications on File Prior to Visit  Medication Sig Dispense Refill   acetaminophen (TYLENOL) 500 MG tablet Take 500 mg by mouth every 6 (six) hours as needed.     albuterol (VENTOLIN HFA) 108 (90 Base) MCG/ACT inhaler Inhale 1-2 puffs into the lungs every 4 (four) hours as needed for wheezing or shortness of breath. (Patient taking differently: Inhale 1-2 puffs into the lungs every 4 (four) hours as needed for wheezing or shortness of breath.) 18 g 1   ALPRAZolam (XANAX) 0.5 MG tablet TAKE 1/2 TO 1 TABLET ONCE A DAY IF NEEDED FOR SEVERE ANXIETY OR SLEEP. **Use sparingly to prevent addiction and tolerance. 30 tablet 0   aspirin EC 81 MG tablet Take 81 mg by mouth daily.      B Complex Vitamins (B COMPLEX PO) Take 1 capsule by mouth daily.     calcium carbonate (TUMS - DOSED IN MG ELEMENTAL CALCIUM) 500 MG chewable tablet Chew 2 tablets by mouth daily.     cetirizine (ZYRTEC) 10 MG tablet Take 10 mg by mouth daily.     Cholecalciferol (VITAMIN D-3) 5000 UNITS TABS Take 5,000 Units by mouth daily.      Cinnamon 500 MG capsule Take 500 mg by mouth daily.      diphenhydrAMINE (BENADRYL) 25 MG tablet Take 25 mg by mouth every 6 (six) hours as needed.     famotidine (PEPCID) 20 MG tablet TAKE 1 TABLET BY MOUTH TWICE DAILY FOR INDIGESTION AND HEARTBURN (Patient taking differently: Take 40 mg by mouth 2 (two) times daily.) 180 tablet 3   guaifenesin (HUMIBID E) 400 MG TABS tablet Take 400 mg by mouth daily.     hyoscyamine (LEVSIN SL) 0.125 MG SL tablet DISSOLVE ONE TABLET UNDER THE TONGUE EVERY 4 HOURS IF NEEDED FOR NAUSEA, BLOATING, CRAMPING, OR DIARRHEA 120 tablet 3   ipratropium-albuterol (DUONEB) 0.5-2.5 (3) MG/3ML SOLN USE 1 VIAL IN NEBULIZER EVERY 4 HOURS AS NEEDED **MAX 6 DOSES A DAY** 540 mL 0   Magnesium 400 MG CAPS Take 1 capsule by mouth daily.     ondansetron (ZOFRAN) 8 MG tablet TAKE 1  TABLET BY MOUTH 3 TIMES A DAY AS NEEDED FOR NAUSEA (Patient taking differently: Take 8 mg by mouth every 8 (eight) hours as needed. TAKE 1 TABLET BY MOUTH 3 TIMES A DAY AS NEEDED FOR NAUSEA) 60 tablet 2   sertraline (ZOLOFT) 100 MG tablet Take 1 tablet by mouth daily for mood (Patient taking differently: Take 100 mg by mouth daily. Take 1 tablet by mouth daily for mood) 90 tablet 3   Turmeric 500 MG CAPS Take 1 tablet by mouth daily.     vitamin C (ASCORBIC ACID) 500 MG tablet Take 500 mg by mouth daily.     vitamin E 180 MG (400 UNITS) capsule Take 400 Units by mouth daily.     aspirin-acetaminophen-caffeine (EXCEDRIN MIGRAINE) 250-250-65 MG tablet Take 1 tablet by mouth every 6 (six) hours as needed for headache. (Patient not taking: Reported on 06/13/2021)     No current facility-administered medications on file prior to visit.    Allergies  Allergen Reactions   Ace Inhibitors Other (See Comments)    Acute renal failure (lisinopril)   Cephalexin Anaphylaxis   Dilaudid [Hydromorphone Hcl] Anaphylaxis   Robaxin [Methocarbamol] Anaphylaxis   Macrobid [Nitrofurantoin] Other (See Comments)    Causes hyperthermia    Neurontin [Gabapentin]     Confusion     Codeine Nausea Only   Latex Rash   Sulfa Antibiotics Other (See Comments)    Do not take per nephrology    Current Problems (verified) Patient Active Problem List   Diagnosis Date Noted   Incontinence of feces 12/23/2020   Interstitial cystitis 06/14/2020   Overweight (BMI 25.0-29.9) 06/13/2020   Former smoker (40 pack year, quit 2004) 08/30/2019   Atherosclerosis of aorta ([place]) 03/22/2019   Emphysema lung ([place])    Chronic diastolic CHF (congestive heart failure) ([place]) 03/11/2019   Osteopenia 01/16/2019   History of colon polyps 06/28/2018   CKD Stage 3 ([place]) 01/10/2015   IBS  01/10/2015   Hyperlipidemia, mixed 06/12/2014   Medication management 06/12/2014   LBBB (left bundle branch block) 07/24/2013   HTN (hypertension) 07/24/2013   Major depression, recurrent, full remission (HCC)    OCD (obsessive compulsive disorder)    Abnormal glucose    Migraines    Vitamin D deficiency    Hypothyroidism 04/05/2013    Screening Tests Immunization History  Administered Date(s) Administered   Influenza Split 01/31/2016, 05/12/2018, 03/17/2019   Influenza, High Dose Seasonal PF 03/22/2017, 02/16/2018, 03/22/2019, 03/24/2021   Influenza-Unspecified 01/31/2016, 02/09/2020   PFIZER Comirnaty([place] Top)Covid-19 Tri-Sucrose Vaccine 03/31/2021   PFIZER(Purple Top)SARS-COV-2 Vaccination 07/05/2019, 07/26/2019, 03/04/2020   Pneumococcal Conjugate-13 03/22/2017, 06/29/2018   Pneumococcal Polysaccharide-23 02/12/1999, 06/29/2018   Td 04/27/2013   Zoster Recombinat (Shingrix) 06/14/2020, 08/28/2020   Zoster, Live 11/24/2005   Health Maintenance  Topic Date Due   DEXA SCAN  01/15/2021   COVID-19 Vaccine (5 - Booster for [place] series) 06/29/2021 (Originally 05/26/2021)   MAMMOGRAM  11/13/2022   TETANUS/TDAP  04/28/2023   COLONOSCOPY (Pts 45-60yrs Insurance coverage will need to be confirmed)  11/14/2025   Pneumonia Vaccine 16+ Years old  Completed   INFLUENZA VACCINE  Completed    Hepatitis C Screening  Completed   Zoster Vaccines- Shingrix  Completed   HPV VACCINES  Aged Out      Preventative care: Last colonoscopy: 10/2020 Dr. Fuller Plan, polyps, 5 year recall Last EGD: 02/2019  Last mammogram: annually at breast center  Last breast MRI: 10/2018 Last pap smear/pelvic exam: 2018, s/p total hysterectomy, DONE  DEXA: 12/2018, R fem T -2.1 - will get 2023 - ordered to schedule  Former smoker, 40 pack year, quit 2004 Last CT chest 09/17/2020, emphysema, aortic atherosclerosis, no nodule, no further follow up recommended  Names of Other Physician/Practitioners you currently use: 1. [facility] here for primary care 2. Dr. Delman Cheadle, eye doctor, last visit 2021,  3. Dr. Woodfin Ganja, dentist, last visit 2020, wants to find a new provider who takes insurance   Patient Care Team: Unk [name], MD as PCP - General (Internal Medicine) Josue Hector, MD as PCP - Cardiology (Cardiology) Donato Heinz, MD as Consulting Physician (Nephrology) Melissa Noon, [place] as Referring Physician (Optometry) Ladene Artist, MD as Consulting Physician (Gastroenterology) Josue Hector, MD as Consulting Physician (Cardiology) Rush Landmark, [facility] as Pharmacist (Pharmacist) Landry Mellow, Candace (Gynecology)  SURGICAL HISTORY She  has a past surgical history that includes Tonsillectomy; Bunionectomy (Bilateral, 1997); Hemorrhoid surgery (1976); Breast enhancement surgery (Bilateral); ORIF ankle fracture (Left, 08/30/2017); Breast implant removal (Bilateral, 03/07/2019); Esophagogastroduodenoscopy (egd) with propofol (N/A, 03/12/2019); Colonoscopy (10/2020); Cataract extraction w/ intraocular lens implant (Right, 2005); and Inguinal hernia repair (N/A, 04/10/2021). FAMILY HISTORY Her family history includes Colon polyps in her mother; Heart block in her maternal grandmother; Hypertension in her sister; Muscular dystrophy in her maternal grandmother and  mother. SOCIAL HISTORY She  reports that she quit smoking about 19 years ago. Her smoking use included cigarettes. She has a 40.00 pack-year smoking history. She has never used smokeless tobacco. She reports that she does not drink alcohol and does not use drugs.   Review of Systems  Constitutional:  Negative for malaise/fatigue and weight loss.  HENT:  Negative for hearing loss and tinnitus.   Eyes:  Negative for blurred vision and double vision.  Respiratory:  Negative for cough, shortness of breath and wheezing.   Cardiovascular:  Negative for chest pain, palpitations, orthopnea, claudication and leg swelling.  Gastrointestinal:  Positive for diarrhea (persistent bristol 6 with fecal soiling). Negative for abdominal pain, blood in stool, constipation, heartburn, melena, nausea and vomiting.  Genitourinary: Negative.   Musculoskeletal:  Negative for falls, joint pain and myalgias.  Skin:  Negative for rash.  Neurological:  Negative for dizziness, tingling, sensory change, weakness and headaches.  Endo/Heme/Allergies:  Negative for polydipsia.  Psychiatric/Behavioral:  Negative for depression, memory loss, substance abuse and suicidal ideas. The patient is not nervous/anxious and does not have insomnia.   All other systems reviewed and are negative.   Objective:     Today's Vitals   06/13/21 1000  BP: 100/62  Pulse: 64  Temp: (!) 97.5 F (36.4 C)  SpO2: 97%  Weight: 166 lb 9.6 oz (75.6 kg)  Height: 5' 2.5" (1.588 m)   Body mass index is 29.99 kg/m.  General appearance: alert, no distress, WD/WN, female HEENT: normocephalic, sclerae anicteric, TMs pearly, nares patent, no discharge or erythema, pharynx normal Oral cavity: MMM, no lesions Neck: supple, no lymphadenopathy, no thyromegaly, no masses Heart: RRR, normal S1, S2, no murmurs Lungs: CTA bilaterally, no wheezes, rhonchi, or rales Abdomen: +bs, soft, non tender, non distended, no masses, no hepatomegaly, no  splenomegaly, no hernias. Well healed lap incision sites.  Musculoskeletal: nontender, no swelling, no obvious deformity Extremities: no edema, no cyanosis, no clubbing.  Pulses: 2+ symmetric, upper and lower extremities, normal cap refill Neurological: alert, oriented x 3, CN2-12 intact, strength normal upper extremities and lower extremities, sensation normal throughout, DTRs 2+ throughout, no cerebellar signs, gait normal Psychiatric: normal  affect, behavior normal, pleasant   EKG: Sinus brady, LBBB, NSCPT   Lauren Ribas, NP   06/13/2021

## 2021-06-13 NOTE — Patient Instructions (Addendum)
°  [name] , Thank you for taking time to come for your Annual Wellness Visit. I appreciate your ongoing commitment to your health goals. Please review the following plan we discussed and let me know if I can assist you in the future.   These are the goals we discussed:  Goals      Blood Pressure < 130/80     Exercise 150 min/wk Moderate Activity     LDL CALC < 100     Track and Manage Fluids and Swelling-Heart Failure     Timeframe:  Long-Range Goal Priority:  High Start Date:                             Expected End Date:                       Follow Up Date 04/29/2021    - track weight in diary - use salt in moderation - watch for swelling in feet, ankles and legs every day - weigh myself daily    Why is this important?   It is important to check your weight daily and watch how much salt and liquids you have.  It will help you to manage your heart failure.    Notes:         This is a list of the screening recommended for you and due dates:  Health Maintenance  Topic Date Due   DEXA scan (bone density measurement)  01/15/2021   COVID-19 Vaccine (5 - Booster for Pfizer series) 06/29/2021*   Mammogram  11/13/2022   Tetanus Vaccine  04/28/2023   Colon Cancer Screening  11/14/2025   Pneumonia Vaccine  Completed   Flu Shot  Completed   Hepatitis C Screening: USPSTF Recommendation to screen - Ages 18-79 yo.  Completed   Zoster (Shingles) Vaccine  Completed   HPV Vaccine  Aged Out  *Topic was postponed. The date shown is not the original due date.       Try stopping supplements - miralax - add soluble fiber and see how bowels do     Know what a healthy weight is for you (roughly BMI <25) and aim to maintain this  Aim for 7+ servings of fruits and vegetables daily  65-80+ fluid ounces of water or unsweet tea for healthy kidneys  Limit to max 1 drink of alcohol per day; avoid smoking/tobacco  Limit animal fats in diet for cholesterol and heart health - choose  grass fed whenever available  Avoid highly processed foods, and foods high in saturated/trans fats  Aim for low stress - take time to unwind and care for your mental health  Aim for 150 min of moderate intensity exercise weekly for heart health, and weights twice weekly for bone health  Aim for 7-9 hours of sleep daily      A great goal to work towards is aiming to get in a serving daily of some of the most nutritionally dense foods - G- BOMBS daily

## 2021-06-14 ENCOUNTER — Encounter: Payer: Self-pay | Admitting: Adult Health

## 2021-06-14 DIAGNOSIS — I2510 Atherosclerotic heart disease of native coronary artery without angina pectoris: Secondary | ICD-10-CM | POA: Insufficient documentation

## 2021-06-14 LAB — CBC WITH DIFFERENTIAL/PLATELET
Absolute Monocytes: 386 cells/uL (ref 200–950)
Basophils Absolute: 50 cells/uL (ref 0–200)
Basophils Relative: 0.9 %
Eosinophils Absolute: 207 cells/uL (ref 15–500)
Eosinophils Relative: 3.7 %
HCT: 40.7 % (ref 35.0–45.0)
Hemoglobin: 13.7 g/dL (ref 11.7–15.5)
Lymphs Abs: 1518 cells/uL (ref 850–3900)
MCH: 31 pg (ref 27.0–33.0)
MCHC: 33.7 g/dL (ref 32.0–36.0)
MCV: 92.1 fL (ref 80.0–100.0)
MPV: 10.8 fL (ref 7.5–12.5)
Monocytes Relative: 6.9 %
Neutro Abs: 3438 cells/uL (ref 1500–7800)
Neutrophils Relative %: 61.4 %
Platelets: 272 10*3/uL (ref 140–400)
RBC: 4.42 10*6/uL (ref 3.80–5.10)
RDW: 13.4 % (ref 11.0–15.0)
Total Lymphocyte: 27.1 %
WBC: 5.6 10*3/uL (ref 3.8–10.8)

## 2021-06-14 LAB — URINALYSIS, ROUTINE W REFLEX MICROSCOPIC
Bacteria, UA: NONE SEEN /HPF
Bilirubin Urine: NEGATIVE
Glucose, UA: NEGATIVE
Hgb urine dipstick: NEGATIVE
Ketones, ur: NEGATIVE
Nitrite: NEGATIVE
Protein, ur: NEGATIVE
RBC / HPF: NONE SEEN /HPF (ref 0–2)
Specific Gravity, Urine: 1.02 (ref 1.001–1.035)
pH: 5.5 (ref 5.0–8.0)

## 2021-06-14 LAB — COMPLETE METABOLIC PANEL WITH GFR
AG Ratio: 1.4 (calc) (ref 1.0–2.5)
ALT: 14 U/L (ref 6–29)
AST: 17 U/L (ref 10–35)
Albumin: 4 g/dL (ref 3.6–5.1)
Alkaline phosphatase (APISO): 63 U/L (ref 37–153)
BUN/Creatinine Ratio: 22 (calc) (ref 6–22)
BUN: 27 mg/dL — ABNORMAL HIGH (ref 7–25)
CO2: 27 mmol/L (ref 20–32)
Calcium: 9.6 mg/dL (ref 8.6–10.4)
Chloride: 102 mmol/L (ref 98–110)
Creat: 1.21 mg/dL — ABNORMAL HIGH (ref 0.50–1.05)
Globulin: 2.9 g/dL (calc) (ref 1.9–3.7)
Glucose, Bld: 90 mg/dL (ref 65–99)
Potassium: 4.8 mmol/L (ref 3.5–5.3)
Sodium: 138 mmol/L (ref 135–146)
Total Bilirubin: 0.5 mg/dL (ref 0.2–1.2)
Total Protein: 6.9 g/dL (ref 6.1–8.1)
eGFR: 49 mL/min/{1.73_m2} — ABNORMAL LOW (ref >=60)

## 2021-06-14 LAB — MICROALBUMIN / CREATININE URINE RATIO
Creatinine, Urine: 117 mg/dL (ref 20–275)
Microalb Creat Ratio: 6 mcg/mg creat (ref <30)
Microalb, Ur: 0.7 mg/dL

## 2021-06-14 LAB — HEMOGLOBIN A1C
Hgb A1c MFr Bld: 5.4 % of total Hgb (ref <5.7)
Mean Plasma Glucose: 108 mg/dL
eAG (mmol/L): 6 mmol/L

## 2021-06-14 LAB — VITAMIN B12: Vitamin B-12: 1001 pg/mL (ref 200–1100)

## 2021-06-14 LAB — LIPID PANEL
Cholesterol: 208 mg/dL — ABNORMAL HIGH (ref <200)
HDL: 67 mg/dL (ref >=50)
LDL Cholesterol (Calc): 122 mg/dL (calc) — ABNORMAL HIGH
Non-HDL Cholesterol (Calc): 141 mg/dL (calc) — ABNORMAL HIGH (ref <130)
Total CHOL/HDL Ratio: 3.1 (calc) (ref <5.0)
Triglycerides: 91 mg/dL (ref <150)

## 2021-06-14 LAB — VITAMIN D 25 HYDROXY (VIT D DEFICIENCY, FRACTURES): Vit D, 25-Hydroxy: 83 ng/mL (ref 30–100)

## 2021-06-14 LAB — MICROSCOPIC MESSAGE

## 2021-06-14 LAB — MAGNESIUM: Magnesium: 2 mg/dL (ref 1.5–2.5)

## 2021-06-14 LAB — TSH: TSH: 1.33 mIU/L (ref 0.40–4.50)

## 2021-06-16 ENCOUNTER — Encounter: Payer: Self-pay | Admitting: Physical Therapy

## 2021-06-16 ENCOUNTER — Ambulatory Visit: Payer: PPO | Admitting: Physical Therapy

## 2021-06-16 ENCOUNTER — Other Ambulatory Visit: Payer: Self-pay

## 2021-06-16 DIAGNOSIS — M6281 Muscle weakness (generalized): Secondary | ICD-10-CM | POA: Diagnosis not present

## 2021-06-16 DIAGNOSIS — R279 Unspecified lack of coordination: Secondary | ICD-10-CM

## 2021-06-16 NOTE — Therapy (Signed)
[facility] @ [place], [place], [address] Phone: [phone]   Fax:  [phone]  Physical Therapy Treatment  Patient Details  Name: [name] MRN: [id number] Date of Birth: 1950-05-09 Referring Provider (PT): Dr. Michael Boston   Encounter Date: 06/16/2021   PT End of Session - 06/16/21 0936     Visit Number 6    Date for PT Re-Evaluation 08/04/21    Authorization Type healthteam    PT Start Time 0930    PT Stop Time 1008    PT Time Calculation (min) 38 min    Activity Tolerance Patient tolerated treatment well    Behavior During Therapy [facility] for tasks assessed/performed             Past Medical History:  Diagnosis Date   ADHD (attention deficit hyperactivity disorder)    Chronic diastolic CHF (congestive heart failure) ([place])    cardriologist--- dr Viona Gilmore. Audie Box;   dx in setting sepsis 11/ 2020;   echo in epic 03-10-2019 ef 55-60%, G1DD, mild to moderate TR no stenosis and normal RASP;  CTA 01-31-2021 epic   Chronic kidney disease (CKD), stage III (moderate) ([place])    nephrologist--- dr Amedeo Gory   Depression    Diverticulosis of colon    Emphysema of lung ([place])    no home oxygen   GERD (gastroesophageal reflux disease)    Hiatal hernia    History of acute pyelonephritis    w/ hx sepsis   History of Helicobacter pylori infection 2012   History of recurrent UTIs    History of TIA (transient ischemic attack)    TIAs   Hyperlipidemia, mixed    Hypertension    followed by pcp and nephrologist----  (nuclear stress test in epic 06-28-2015 low risk no ischemia, nuclear ef 60%)   Hypothyroidism    IBS (irritable bowel syndrome)    IC (interstitial cystitis)    remote hx   Inguinal hernia, bilateral    Irritable bowel syndrome with constipation and diarrhea    LBBB (left bundle branch block) 2015   cardiologist --- dr Audie Box;  known chronic LBBB for several yrs   Migraines    OCD (obsessive  compulsive disorder)    OSA (obstructive sleep apnea)    no CPAP intolerant   Osteopenia    Prolapsed hemorrhoids    Seasonal allergies    Vitamin D deficiency     Past Surgical History:  Procedure Laterality Date   BREAST ENHANCEMENT SURGERY Bilateral    1970s   BREAST IMPLANT REMOVAL Bilateral 03/07/2019   BUNIONECTOMY Bilateral 1997   CATARACT EXTRACTION W/ INTRAOCULAR LENS IMPLANT Right 2005   COLONOSCOPY  10/2020   ESOPHAGOGASTRODUODENOSCOPY (EGD) WITH PROPOFOL N/A 03/12/2019   Procedure: ESOPHAGOGASTRODUODENOSCOPY (EGD) WITH PROPOFOL;  Surgeon: Carol Ada, MD;  Location: [facility];  Service: Endoscopy;  Laterality: N/A;   HEMORRHOID SURGERY  1976   INGUINAL HERNIA REPAIR N/A 04/10/2021   Procedure: LAPAROSCOPIC LEFT INGUINAL HERNIA REPAIR AND RIGHT INGUINAL HERNII REPAIR WITH TAP BLOCK;  Surgeon: Michael Boston, MD;  Location: [facility];  Service: General;  Laterality: N/A;   ORIF ANKLE FRACTURE Left 08/30/2017   Procedure: OPEN REDUCTION INTERNAL FIXATION (ORIF) ANKLE FRACTURE;  Surgeon: Rod Can, MD;  Location: [facility];  Service: Orthopedics;  Laterality: Left;   TONSILLECTOMY     CHILD    There were no vitals filed for this visit.   Subjective Assessment - 06/16/21 4580  Subjective I have not had an accident this week. I still have diarrhea. My MD wants me to stop my supplemets to see if it helps. When I stand up I am not able to do a kegel. When I wipe I feel a bulge and in the past I was diagnosised with a prolapse. I have tried estrogen.    Patient Stated Goals Have control over the bowels and bladder    Currently in Pain? No/denies    Multiple Pain Sites No                               [facility] Adult PT Treatment/Exercise - 06/16/21 0001       Lumbar Exercises: Aerobic   Nustep level 5 for 5 minutes while assess patient      Lumbar Exercises: Supine   Clam 15 reps;1 second    Clam Limitations each leg wtih  pelvic floor contraction    Other Supine Lumbar Exercises supine with legs on bolster and pillow under hips to reset the prolapse      Lumbar Exercises: Quadruped   Single Arm Raise Right;Left;10 reps;1 second    Single Arm Raise Weights (lbs) with pelvic floor contraction    Other Quadruped Lumbar Exercises quadruped pelvic floor contraction holding for 10 seconds 10x    Other Quadruped Lumbar Exercises stand with leaning on counter and slide leg backward  with pelvic floor contraction 10x each side                     PT Education - 06/16/21 1011     Education Details Access Code: [place]    Person(s) Educated Patient    Methods Explanation;Demonstration;Verbal cues;Handout    Comprehension Returned demonstration;Verbalized understanding              PT Short Term Goals - 06/16/21 1015       PT SHORT TERM GOAL #1   Title independent with initial HEP for core and pelvic floor    Time 4    Period Weeks    Status Achieved      PT SHORT TERM GOAL #2   Title able to perfrom a full circular contraction of the anus and vagina    Baseline circular contraction of the anus    Time 4    Period Weeks    Status On-going      PT SHORT TERM GOAL #3   Title understand ways to add fiber to increase stool consistency    Time 4    Period Weeks    Status Achieved               PT Long Term Goals - 06/16/21 1016       PT LONG TERM GOAL #1   Title independent with advanced HEP for core and pelvic floor strength    Time 12    Period Weeks    Status On-going    Target Date 08/04/21      PT LONG TERM GOAL #2   Title wears </= 1 pad per day and night due to increased pelvic floor strength and reduction of leakage    Baseline wears a long pad and one thin pad, changes them 6-7 times per day; will change if  stool or urine is on the pad    Time 12    Period Weeks    Status On-going  PT LONG TERM GOAL #3   Title able to push her hterapist finger out of the  rectum so she will be able to push her stool out without a struggle    Time 12    Period Weeks    Status On-going      PT LONG TERM GOAL #4   Title rectal strength >/= 3/5 so her stool leakage is decreased >/= 75%    Time 12    Period Weeks    Status On-going    Target Date 08/04/21      PT LONG TERM GOAL #5   Title vaginal strength >/= 3/5 to reduce her urinary leakage >/= 60%    Time 12    Period Weeks    Status On-going                   Plan - 06/16/21 1011     Clinical Impression Statement Patient had no stool leakage for one week. She does wipe and have stool on the toilet paper. Patient is not able to contract the pelvic floor in standing due to weakness. She does feel a bulge when she wipes and in the past she was diagnosed with prolapse and will see Dr. Wannetta Sender in March. Patient was not able to do hip extension while leaning on wall due to back pain but able to do it on a counter and move slowly to feel the contraction. Patient was educated on how to manage prolapse. Patient will benefit from skilled therapy to improve pelvic floor strength and coordination to reduce her leakage.    Personal Factors and Comorbidities Age;Comorbidity 3+;Fitness    Comorbidities hernia repair and inguinal hernia repair 04/10/2021; vaginal hysterectomy; COPD; IBS; Chronic kidney disease    Examination-Activity Limitations Stand;Toileting;Continence;Locomotion Level;Lift    Examination-Participation Restrictions Community Activity;Cleaning    Stability/Clinical Decision Making Evolving/Moderate complexity    Rehab Potential Good    PT Frequency 2x / week    PT Duration 12 weeks    PT Treatment/Interventions ADLs/Self Care Home Management;Biofeedback;Therapeutic activities;Therapeutic exercise;Neuromuscular re-education;Patient/family education;Manual techniques;Taping;Joint Manipulations;Spinal Manipulations    PT Next Visit Plan work on core work and pelvic floor contraction; sit to  stand with pelvic floor contraction, stand with marching; review HEP    PT Home Exercise Plan Access Code: [email or web address]    Consulted and Agree with Plan of Care Patient             Patient will benefit from skilled therapeutic intervention in order to improve the following deficits and impairments:  Decreased coordination, Increased fascial restricitons, Decreased endurance, Decreased activity tolerance, Decreased strength  Visit Diagnosis: Muscle weakness (generalized)  Unspecified lack of coordination     Problem List Patient Active Problem List   Diagnosis Date Noted   Coronary atherosclerosis of native coronary artery (per CTA 01/31/2021)  06/14/2021   Incontinence of feces 12/23/2020   Interstitial cystitis 06/14/2020   Overweight (BMI 25.0-29.9) 06/13/2020   Former smoker (40 pack year, quit 2004) 08/30/2019   Atherosclerosis of aorta ([place]) 03/22/2019   Emphysema lung ([place])    Chronic diastolic CHF (congestive heart failure) ([place]) 03/11/2019   Osteopenia 01/16/2019   History of colon polyps 06/28/2018   CKD Stage 3 ([place]) 01/10/2015   IBS  01/10/2015   Hyperlipidemia, mixed 06/12/2014   Medication management 06/12/2014   LBBB (left bundle branch block) 07/24/2013   HTN (hypertension) 07/24/2013   Major depression, recurrent, full remission ([facility])    OCD (obsessive compulsive disorder)  Abnormal glucose    Migraines    Vitamin D deficiency    Hypothyroidism 04/05/2013    Earlie Counts, PT 06/16/21 10:17 AM   [facility] @ [place], [place], [address] Phone: [phone]   Fax:  [phone]  Name: SKYANN GANIM MRN: [id number] Date of Birth: March 25, 1951

## 2021-06-16 NOTE — Patient Instructions (Signed)
Access Code: [email or web address] URL: [email or web address] Date: 06/16/2021 Prepared by: Earlie Counts  Exercises Diaphragmatic Breathing at 90/90 Supported - 1 x daily - 7 x weekly - 1 sets - 10 reps Bent Knee Fallouts - 1 x daily - 3 x weekly - 2 sets - 10 reps Quadruped Pelvic Floor Contraction with Weight Shift All Directions - 1 x daily - 7 x weekly - 1 sets - 5 reps - 10 sec hold Quadruped Exhale with Pelvic Floor Contraction and Arm Raise - 1 x daily - 7 x weekly - 2 sets - 10 reps [facility] [address] [place], [place] [address] Phone # [phone] Fax [phone]

## 2021-06-18 ENCOUNTER — Encounter: Payer: PPO | Admitting: Physical Therapy

## 2021-06-23 ENCOUNTER — Encounter: Payer: Self-pay | Admitting: Physical Therapy

## 2021-06-23 ENCOUNTER — Ambulatory Visit: Payer: PPO | Admitting: Physical Therapy

## 2021-06-23 ENCOUNTER — Other Ambulatory Visit: Payer: Self-pay

## 2021-06-23 DIAGNOSIS — M6281 Muscle weakness (generalized): Secondary | ICD-10-CM

## 2021-06-23 DIAGNOSIS — R279 Unspecified lack of coordination: Secondary | ICD-10-CM

## 2021-06-23 NOTE — Therapy (Signed)
[facility] @ [place], [place], [address] Phone: [phone]   Fax:  [phone]  Physical Therapy Treatment  Patient Details  Name: [name] MRN: [id number] Date of Birth: 08/29/1950 Referring Provider (PT): Dr. Michael Boston   Encounter Date: 06/23/2021   PT End of Session - 06/23/21 1451     Visit Number 7    Date for PT Re-Evaluation 08/04/21    Authorization Type healthteam    PT Start Time 1445    PT Stop Time 1525    PT Time Calculation (min) 40 min    Activity Tolerance Patient tolerated treatment well;Patient limited by lethargy    Behavior During Therapy [facility] for tasks assessed/performed             Past Medical History:  Diagnosis Date   ADHD (attention deficit hyperactivity disorder)    Chronic diastolic CHF (congestive heart failure) ([place])    cardriologist--- dr Viona Gilmore. Audie Box;   dx in setting sepsis 11/ 2020;   echo in epic 03-10-2019 ef 55-60%, G1DD, mild to moderate TR no stenosis and normal RASP;  CTA 01-31-2021 epic   Chronic kidney disease (CKD), stage III (moderate) ([place])    nephrologist--- dr Amedeo Gory   Depression    Diverticulosis of colon    Emphysema of lung ([place])    no home oxygen   GERD (gastroesophageal reflux disease)    Hiatal hernia    History of acute pyelonephritis    w/ hx sepsis   History of Helicobacter pylori infection 2012   History of recurrent UTIs    History of TIA (transient ischemic attack)    TIAs   Hyperlipidemia, mixed    Hypertension    followed by pcp and nephrologist----  (nuclear stress test in epic 06-28-2015 low risk no ischemia, nuclear ef 60%)   Hypothyroidism    IBS (irritable bowel syndrome)    IC (interstitial cystitis)    remote hx   Inguinal hernia, bilateral    Irritable bowel syndrome with constipation and diarrhea    LBBB (left bundle branch block) 2015   cardiologist --- dr Audie Box;  known chronic LBBB for several yrs   Migraines     OCD (obsessive compulsive disorder)    OSA (obstructive sleep apnea)    no CPAP intolerant   Osteopenia    Prolapsed hemorrhoids    Seasonal allergies    Vitamin D deficiency     Past Surgical History:  Procedure Laterality Date   BREAST ENHANCEMENT SURGERY Bilateral    1970s   BREAST IMPLANT REMOVAL Bilateral 03/07/2019   BUNIONECTOMY Bilateral 1997   CATARACT EXTRACTION W/ INTRAOCULAR LENS IMPLANT Right 2005   COLONOSCOPY  10/2020   ESOPHAGOGASTRODUODENOSCOPY (EGD) WITH PROPOFOL N/A 03/12/2019   Procedure: ESOPHAGOGASTRODUODENOSCOPY (EGD) WITH PROPOFOL;  Surgeon: Carol Ada, MD;  Location: [facility];  Service: Endoscopy;  Laterality: N/A;   HEMORRHOID SURGERY  1976   INGUINAL HERNIA REPAIR N/A 04/10/2021   Procedure: LAPAROSCOPIC LEFT INGUINAL HERNIA REPAIR AND RIGHT INGUINAL HERNII REPAIR WITH TAP BLOCK;  Surgeon: Michael Boston, MD;  Location: [facility];  Service: General;  Laterality: N/A;   ORIF ANKLE FRACTURE Left 08/30/2017   Procedure: OPEN REDUCTION INTERNAL FIXATION (ORIF) ANKLE FRACTURE;  Surgeon: Rod Can, MD;  Location: [facility];  Service: Orthopedics;  Laterality: Left;   TONSILLECTOMY     CHILD    There were no vitals filed for this visit.   Subjective Assessment - 06/23/21  1452     Subjective I am having trouble doing the kegels when I am in standing. The bowels are still better. It is 70% better. When I am constipated the rectocele will have some stool in it. The stool comes out as I walk.    Patient Stated Goals Have control over the bowels and bladder    Currently in Pain? No/denies    Multiple Pain Sites No                            Pelvic Floor Special Questions - 06/23/21 0001     Pelvic Floor Internal Exam Patient confirms identification and approves PT to assess pelvic floor and treatment    Exam Type Vaginal    Strength fair squeeze, definite lift   not a hug of the therapist finger               [facility] Adult PT Treatment/Exercise - 06/23/21 0001       Neuro Re-ed    Neuro Re-ed Details  tactile cues to the sides of the introitus to facilitate the pelvic floor contraction and using the hip adductors to have more of a circular contraction; standing pelvic floor contraction and she was able to feel it better.      Lumbar Exercises: Aerobic   Nustep level 5 for 7 minutes while assess patient      Manual Therapy   Manual Therapy Internal Pelvic Floor    Internal Pelvic Floor internal work to the levator ani, along the introitus                     PT Education - 06/23/21 1524     Education Details Access Code: [email or web address]    Person(s) Educated Patient    Methods Explanation;Demonstration;Verbal cues;Handout    Comprehension Returned demonstration;Verbalized understanding              PT Short Term Goals - 06/23/21 1459       PT SHORT TERM GOAL #1   Title independent with initial HEP for core and pelvic floor    Time 4    Period Weeks    Status Achieved      PT SHORT TERM GOAL #2   Title able to perfrom a full circular contraction of the anus and vagina    Baseline circular contraction of the anus    Time 4    Period Weeks    Status Achieved      PT SHORT TERM GOAL #3   Title understand ways to add fiber to increase stool consistency    Time 4    Period Weeks    Status Achieved               PT Long Term Goals - 06/23/21 1500       PT LONG TERM GOAL #1   Title independent with advanced HEP for core and pelvic floor strength    Time 12    Period Weeks    Status On-going      PT LONG TERM GOAL #2   Title wears </= 1 pad per day and night due to increased pelvic floor strength and reduction of leakage    Baseline 2 pads in 24 hours and places a top pad on it.    Time 12    Period Weeks    Status On-going      PT LONG TERM GOAL #  3   Title able to push her hterapist finger out of the rectum so she will be able to push her stool  out without a struggle    Time 12    Period Weeks    Status On-going      PT LONG TERM GOAL #4   Title rectal strength >/= 3/5 so her stool leakage is decreased >/= 75%    Time 12    Period Weeks    Status On-going      PT LONG TERM GOAL #5   Title vaginal strength >/= 3/5 to reduce her urinary leakage >/= 60%    Time 12    Period Weeks    Status On-going                   Plan - 06/23/21 1503     Clinical Impression Statement Patient has had no bowel accidents in 2 weeks and she feels it is 70% better. . She is able get to the commode when stool comes out of the rectocele. Pelvic floor strength is 3/5 but does not fully hug the therapist finger. Patient was able to feel the pelvic floor contraction in standing for the first time after manual work. Patient will benefit from skilled therapy to improve pelvic floor strength and coordination to reduce her leakage.    Personal Factors and Comorbidities Age;Comorbidity 3+;Fitness    Comorbidities hernia repair and inguinal hernia repair 04/10/2021; vaginal hysterectomy; COPD; IBS; Chronic kidney disease    Examination-Activity Limitations Stand;Toileting;Continence;Locomotion Level;Lift    Examination-Participation Restrictions Community Activity;Cleaning    Stability/Clinical Decision Making Evolving/Moderate complexity    Rehab Potential Good    PT Frequency 2x / week    PT Duration 12 weeks    PT Treatment/Interventions ADLs/Self Care Home Management;Biofeedback;Therapeutic activities;Therapeutic exercise;Neuromuscular re-education;Patient/family education;Manual techniques;Taping;Joint Manipulations;Spinal Manipulations    PT Next Visit Plan work on core work and pelvic floor contraction; sit to stand with pelvic floor contraction, stand with marching; review HEP    PT Home Exercise Plan Access Code: [email or web address]    Consulted and Agree with Plan of Care Patient             Patient will benefit from skilled therapeutic  intervention in order to improve the following deficits and impairments:  Decreased coordination, Increased fascial restricitons, Decreased endurance, Decreased activity tolerance, Decreased strength  Visit Diagnosis: Muscle weakness (generalized)  Unspecified lack of coordination     Problem List Patient Active Problem List   Diagnosis Date Noted   Coronary atherosclerosis of native coronary artery (per CTA 01/31/2021)  06/14/2021   Incontinence of feces 12/23/2020   Interstitial cystitis 06/14/2020   Overweight (BMI 25.0-29.9) 06/13/2020   Former smoker (40 pack year, quit 2004) 08/30/2019   Atherosclerosis of aorta ([place]) 03/22/2019   Emphysema lung ([place])    Chronic diastolic CHF (congestive heart failure) ([place]) 03/11/2019   Osteopenia 01/16/2019   History of colon polyps 06/28/2018   CKD Stage 3 ([place]) 01/10/2015   IBS  01/10/2015   Hyperlipidemia, mixed 06/12/2014   Medication management 06/12/2014   LBBB (left bundle branch block) 07/24/2013   HTN (hypertension) 07/24/2013   Major depression, recurrent, full remission ([place])    OCD (obsessive compulsive disorder)    Abnormal glucose    Migraines    Vitamin D deficiency    Hypothyroidism 04/05/2013    Earlie Counts, PT 06/23/21 3:27 PM  [facility] @ [place] [address],  [place], [address] Phone: [phone]   Fax:  [phone]  Name: JOSHALYN ANCHETA MRN: [id number] Date of Birth: 1950-04-29

## 2021-06-23 NOTE — Patient Instructions (Signed)
Access Code: [email or web address] URL: [email or web address] Date: 06/23/2021 Prepared by: Earlie Counts  Exercises Standing Pelvic Floor Contraction - 3 x daily - 7 x weekly - 1 sets - 5 reps - 5sec hold [facility] [address] [place], [place] [address] Phone # [phone] Fax [phone]

## 2021-06-24 DIAGNOSIS — I10 Essential (primary) hypertension: Secondary | ICD-10-CM | POA: Diagnosis not present

## 2021-06-24 DIAGNOSIS — E039 Hypothyroidism, unspecified: Secondary | ICD-10-CM | POA: Diagnosis not present

## 2021-06-24 DIAGNOSIS — I5032 Chronic diastolic (congestive) heart failure: Secondary | ICD-10-CM | POA: Diagnosis not present

## 2021-06-27 ENCOUNTER — Other Ambulatory Visit: Payer: Self-pay | Admitting: Adult Health

## 2021-06-27 ENCOUNTER — Other Ambulatory Visit (HOSPITAL_COMMUNITY): Payer: Self-pay

## 2021-06-27 DIAGNOSIS — F32A Depression, unspecified: Secondary | ICD-10-CM

## 2021-06-27 DIAGNOSIS — F988 Other specified behavioral and emotional disorders with onset usually occurring in childhood and adolescence: Secondary | ICD-10-CM

## 2021-06-27 MED ORDER — BUPROPION HCL ER (XL) 150 MG PO TB24
ORAL_TABLET | ORAL | 1 refills | Status: DC
Start: 2021-06-27 — End: 2021-07-04
  Filled 2021-06-27: qty 270, 90d supply, fill #0

## 2021-07-02 ENCOUNTER — Encounter: Payer: PPO | Admitting: Physical Therapy

## 2021-07-04 ENCOUNTER — Other Ambulatory Visit (HOSPITAL_COMMUNITY)
Admission: RE | Admit: 2021-07-04 | Discharge: 2021-07-04 | Disposition: A | Discharge status: Home / Self Care | Payer: PPO | Source: Ambulatory Visit | Attending: Obstetrics and Gynecology | Admitting: Obstetrics and Gynecology

## 2021-07-04 ENCOUNTER — Ambulatory Visit: Payer: PPO | Admitting: Obstetrics and Gynecology

## 2021-07-04 ENCOUNTER — Other Ambulatory Visit: Payer: Self-pay

## 2021-07-04 ENCOUNTER — Encounter: Payer: Self-pay | Admitting: Obstetrics and Gynecology

## 2021-07-04 VITALS — BP 122/76 | HR 56 | Ht 63.0 in | Wt 166.0 lb

## 2021-07-04 DIAGNOSIS — N3000 Acute cystitis without hematuria: Secondary | ICD-10-CM | POA: Diagnosis not present

## 2021-07-04 DIAGNOSIS — N393 Stress incontinence (female) (male): Secondary | ICD-10-CM

## 2021-07-04 DIAGNOSIS — R159 Full incontinence of feces: Secondary | ICD-10-CM | POA: Diagnosis not present

## 2021-07-04 DIAGNOSIS — N390 Urinary tract infection, site not specified: Secondary | ICD-10-CM

## 2021-07-04 DIAGNOSIS — N816 Rectocele: Secondary | ICD-10-CM

## 2021-07-04 DIAGNOSIS — R350 Frequency of micturition: Secondary | ICD-10-CM

## 2021-07-04 DIAGNOSIS — N993 Prolapse of vaginal vault after hysterectomy: Secondary | ICD-10-CM | POA: Diagnosis not present

## 2021-07-04 DIAGNOSIS — N3941 Urge incontinence: Secondary | ICD-10-CM

## 2021-07-04 LAB — POCT URINALYSIS DIPSTICK
Appearance: ABNORMAL
Bilirubin, UA: NEGATIVE
Glucose, UA: NEGATIVE
Ketones, UA: NEGATIVE
Nitrite, UA: POSITIVE
Protein, UA: POSITIVE — AB
Spec Grav, UA: >=1.03 — AB (ref 1.010–1.025)
Urobilinogen, UA: 0.2 E.U./dL
pH, UA: 6 (ref 5.0–8.0)

## 2021-07-04 MED ORDER — ESTRADIOL 0.1 MG/GM VA CREA: 0.5000 g | TOPICAL_CREAM | VAGINAL | 11 refills | Status: DC

## 2021-07-04 MED ORDER — SULFAMETHOXAZOLE-TRIMETHOPRIM 800-160 MG PO TABS: 1.0000 | ORAL_TABLET | Freq: Two times a day (BID) | ORAL | 0 refills | Status: AC

## 2021-07-04 NOTE — Patient Instructions (Addendum)
Accidental Bowel Leakage: Our goal is to achieve formed bowel movements daily or every-other-day without leakage.  You may need to try different combinations of the following options to find what works best for you.  Some management options include: ?Dietary changes (more leafy greens, vegetables and fruits; less processed foods) ?Fiber supplementation (Metamucil or something with psyllium as active ingredient) ?Over-the-counter imodium (tablets or liquid) to help solidify the stool and prevent leakage of stool. ?If you get constipated you can use Miralax as needed to achieve bowel movements. ? ? ?Start vaginal estrogen therapy nightly for two weeks then 2 times weekly at night for treatment of vaginal atrophy (dryness of the vaginal tissues).  Please let us know if the prescription is too expensive and we can look for alternative options.  ? ?

## 2021-07-04 NOTE — Progress Notes (Signed)
[facility] New Patient Evaluation and Consultation  Referring Provider: Unk [name], MD PCP: Unk [name], MD Date of Service: 07/04/2021  SUBJECTIVE Chief Complaint: New Patient (Initial Visit) [name] is a 71 y.o. female here for a consult on prolapse and incontinence./)  History of Present Illness: [name] is a 71 y.o. White or Caucasian female presenting for evaluation of interstitial cystitis, prolapse and incontinence.    Review of urine culture records from Epic:  05/15/21 >100,000 E.Coli 02/06/21 no growth 01/31/21 multiple species, suggest recollection 12/23/20 > 100,000 E.Coli  Urinary Symptoms: Leaks urine with cough/ sneeze, laughing, exercise, lifting, going from sitting to standing, with movement to the bathroom, and with urgency. UUI > SUI Leaks 8 time(s) per day.  Pad use: 8 pads per day.   She is bothered by her UI symptoms. Previously tried: myrbetriq and estrogen cream, doing pelvic PT  Day time voids- 6.  Nocturia: every 2 hours per night to void. Voiding dysfunction: she empties her bladder well.  does not use a catheter to empty bladder.  When urinating, she feels she has no difficulties  UTIs: 4 UTI's in the last year.   Reports history of pyelonephritis  Pelvic Organ Prolapse Symptoms:                  She Admits to a feeling of a bulge the vaginal area. It has been present for years She Denies seeing a bulge.  This bulge is bothersome. Seems that is worsened after hysterectomy. Sometimes has stool getting stuck.   Bowel Symptom: Bowel movements: 3+ time(s) per day Stool consistency: loose Straining: yes.  Splinting: no.  Incomplete evacuation: yes.  She Admits to accidental bowel leakage / fecal incontinence  Occurs: when she has diarrhea  Consistency with leakage: soft  or loose Bowel regimen: miralax- stopped recently due to diarrhea. Has used metamucil in the past  Last colonoscopy: Date 10/2020, Results-  polyp removed Bowel symptoms have improved with PT  Sexual Function Sexually active: no.   Pelvic Pain Denies pelvic pain Has a history of DMSO instillations for bladder pain/ IC.   Past Medical History:  Past Medical History:  Diagnosis Date   ADHD (attention deficit hyperactivity disorder)    Chronic diastolic CHF (congestive heart failure) ([place])    cardriologist--- dr Viona Gilmore. Audie Box;   dx in setting sepsis 11/ 2020;   echo in epic 03-10-2019 ef 55-60%, G1DD, mild to moderate TR no stenosis and normal RASP;  CTA 01-31-2021 epic   Chronic kidney disease (CKD), stage III (moderate) ([place])    nephrologist--- dr Amedeo Gory   Depression    Diverticulosis of colon    Emphysema of lung ([place])    no home oxygen   GERD (gastroesophageal reflux disease)    Hiatal hernia    History of acute pyelonephritis    w/ hx sepsis   History of Helicobacter pylori infection 2012   History of recurrent UTIs    History of TIA (transient ischemic attack)    TIAs   Hyperlipidemia, mixed    Hypertension    followed by pcp and nephrologist----  (nuclear stress test in epic 06-28-2015 low risk no ischemia, nuclear ef 60%)   Hypothyroidism    IBS (irritable bowel syndrome)    IC (interstitial cystitis)    remote hx   Inguinal hernia, bilateral    Irritable bowel syndrome with constipation and diarrhea    LBBB (left bundle branch block) 2015   cardiologist --- dr  o'neal;  known chronic LBBB for several yrs   Migraines    OCD (obsessive compulsive disorder)    OSA (obstructive sleep apnea)    no CPAP intolerant   Osteopenia    Prolapsed hemorrhoids    Seasonal allergies    Vitamin D deficiency      Past Surgical History:   Past Surgical History:  Procedure Laterality Date   BREAST ENHANCEMENT SURGERY Bilateral    1970s   BREAST IMPLANT REMOVAL Bilateral 03/07/2019   BUNIONECTOMY Bilateral 1997   CATARACT EXTRACTION W/ INTRAOCULAR LENS IMPLANT Right 2005   COLONOSCOPY  10/2020    ESOPHAGOGASTRODUODENOSCOPY (EGD) WITH PROPOFOL N/A 03/12/2019   Procedure: ESOPHAGOGASTRODUODENOSCOPY (EGD) WITH PROPOFOL;  Surgeon: Carol Ada, MD;  Location: [place];  Service: Endoscopy;  Laterality: N/A;   HEMORRHOID SURGERY  1976   INGUINAL HERNIA REPAIR N/A 04/10/2021   Procedure: LAPAROSCOPIC LEFT INGUINAL HERNIA REPAIR AND RIGHT INGUINAL HERNII REPAIR WITH TAP BLOCK;  Surgeon: Michael Boston, MD;  Location: [place];  Service: General;  Laterality: N/A;   ORIF ANKLE FRACTURE Left 08/30/2017   Procedure: OPEN REDUCTION INTERNAL FIXATION (ORIF) ANKLE FRACTURE;  Surgeon: Rod Can, MD;  Location: [place];  Service: Orthopedics;  Laterality: Left;   TONSILLECTOMY     CHILD     Past OB/GYN History: OB History  Gravida Para Term Preterm AB Living  3         3  SAB IAB Ectopic Multiple Live Births          3    # Outcome Date GA Lbr Len/2nd Weight Sex Delivery Anes PTL Lv  3 Gravida           2 Gravida           1 Gravida             Vaginal deliveries: 3 S/p hysterectomy and BSO   Medications: She has a current medication list which includes the following prescription(s): [START ON 07/07/2021] estradiol, sulfamethoxazole-trimethoprim, acetaminophen, albuterol, alprazolam, aspirin ec, excedrin migraine, atenolol, b complex vitamins, bupropion, calcium carbonate, cetirizine, vitamin d-3, cinnamon, diphenhydramine, famotidine, guaifenesin, hyoscyamine, ipratropium-albuterol, levothyroxine, magnesium, ondansetron, sertraline, turmeric, vitamin c, and vitamin e.   Allergies: Patient is allergic to ace inhibitors, cephalexin, dilaudid [hydromorphone hcl], robaxin [methocarbamol], macrobid [nitrofurantoin], neurontin [gabapentin], codeine, latex, and sulfa antibiotics.   Social History:  Social History   Tobacco Use   Smoking status: Former    Packs/day: 1.00    Years: 40.00    Pack years: 40.00    Types: Cigarettes    Quit date: 2004    Years since  quitting: 19.2   Smokeless tobacco: Never  Vaping Use   Vaping Use: Never used  Substance Use Topics   Alcohol use: No   Drug use: Never    Relationship status: divorced She lives with her dog.   She is not employed. Regular exercise: Yes: home repairs and house work History of abuse: No  Family History:   Family History  Problem Relation Age of Onset   Colon polyps Mother    Muscular dystrophy Mother        late 38s   Hypertension Sister    Muscular dystrophy Maternal Grandmother        39   Heart block Maternal Grandmother    Colon cancer Neg Hx    Esophageal cancer Neg Hx    Rectal cancer Neg Hx    Stomach cancer Neg Hx  Review of Systems: Review of Systems  Constitutional:  Negative for fever, malaise/fatigue and weight loss.  Respiratory:  Positive for shortness of breath and wheezing. Negative for cough.   Cardiovascular:  Negative for chest pain, palpitations and leg swelling.  Gastrointestinal:  Positive for blood in stool. Negative for abdominal pain.  Genitourinary:  Positive for dysuria.  Musculoskeletal:  Negative for myalgias.  Skin:  Negative for rash.  Neurological:  Negative for dizziness and headaches.  Endo/Heme/Allergies:  Bruises/bleeds easily.       + hot flashes  Psychiatric/Behavioral:  Negative for depression. The patient is nervous/anxious.     OBJECTIVE Physical Exam: Vitals:   07/04/21 0953  BP: 122/76  Pulse: (!) 56  Weight: 166 lb (75.3 kg)  Height: '5\' 3"'$  (1.6 m)    Physical Exam Constitutional:      General: She is not in acute distress. Pulmonary:     Effort: Pulmonary effort is normal.  Abdominal:     General: There is no distension.     Palpations: Abdomen is soft.     Tenderness: There is no abdominal tenderness. There is no rebound.  Musculoskeletal:        General: No swelling. Normal range of motion.  Skin:    General: Skin is warm and dry.     Findings: No rash.  Neurological:     Mental Status: She is  alert and oriented to person, place, and time.  Psychiatric:        Mood and Affect: Mood normal.        Behavior: Behavior normal.     GU / Detailed Urogynecologic Evaluation:  Pelvic Exam: Normal external female genitalia; Bartholin's and Skene's glands normal in appearance; urethral meatus normal in appearance, no urethral masses or discharge.   CST: positive  s/p hysterectomy: Speculum exam reveals normal vaginal mucosa with  atrophy and normal vaginal cuff.  Adnexa no mass, fullness, tenderness.     Pelvic floor strength II/V, EAS II/ V  Pelvic floor musculature: Right levator non-tender, Right obturator non-tender, Left levator non-tender, Left obturator non-tender  POP-Q:   POP-Q  -3                                            Aa   -3                                           Ba  -5.5                                              C   3                                            Gh  4                                            Pb  7  tvl   1                                            Ap  1                                            Bp                                                 D     Rectal Exam:  Normal sphincter tone, moderate distal rectocele, enterocoele not present, no rectal masses, no sign of dyssynergia when asking the patient to bear down.  Post-Void Residual (PVR) by Bladder Scan: In order to evaluate bladder emptying, we discussed obtaining a postvoid residual and she agreed to this procedure.  Procedure: The ultrasound unit was placed on the patient's abdomen in the suprapubic region after the patient had voided. A PVR of 0 ml was obtained by bladder scan.  Laboratory Results: POC urine: small leukocytes, positive nitrites   ASSESSMENT AND PLAN Ms. Vincelette is a 71 y.o. with:  1. Urge incontinence   2. Urinary frequency   3. Incontinence of feces, unspecified fecal incontinence type   4. Acute  cystitis without hematuria   5. Recurrent UTI   6. Prolapse of posterior vaginal wall   7. Vaginal vault prolapse after hysterectomy   8. SUI (stress urinary incontinence, female)     OAB - We discussed the symptoms of overactive bladder (OAB), which include urinary urgency, urinary frequency, nocturia, with or without urge incontinence.  While we do not know the exact etiology of OAB, several treatment options exist. We discussed management including behavioral therapy (decreasing bladder irritants, urge suppression strategies, timed voids, bladder retraining), physical therapy, medication; for refractory cases posterior tibial nerve stimulation, sacral neuromodulation, and intravesical botulinum toxin injection.  - Has tried medication and PT without success. She is interested in sacral neuromodualtion. Will have her fill out a 3 day bladder diary to track symptoms.   2. Fecal incontinence - Treatment options include anti-diarrhea medication (loperamide/ Imodium OTC or prescription lomotil), fiber supplements, physical therapy, and possible sacral neuromodulation or surgery.   - She has tried metamucil but not currently on it now with loose stools. Will restart daily and also start miralax every other day to help with constipation.  - We also reviewed that SNM can help with bowel leakage. She will also fill out a bowel diary for 3 days.   3. SUI - she does not feel this is bothersome enough and want to focus on urge incontinence treatment first - Currently also undergoing pelvic PT  4. Acute cystitis - currently symptomatic and UA suggestive of UTI. Will treat with bactrim DS x3 days. She does not have a sulfa allergy but was told to not be on the medication long term.   4. Recurrent UTI - start estrace cream 0.5g nightly for two weeks then twice weekly after for UTI prevention  5. Stage I anterior, Stage II posterior, Stage I apical prolapse - This is bothersome to her but she prefers  physical therapy for now and  focus on other issues  Return to review bladder diary  Jaquita Folds, MD

## 2021-07-06 LAB — URINE CULTURE: Culture: >=100000 — AB

## 2021-07-09 ENCOUNTER — Ambulatory Visit: Payer: PPO | Attending: Surgery | Admitting: Physical Therapy

## 2021-07-09 ENCOUNTER — Encounter: Payer: Self-pay | Admitting: Physical Therapy

## 2021-07-09 ENCOUNTER — Other Ambulatory Visit: Payer: Self-pay

## 2021-07-09 DIAGNOSIS — R279 Unspecified lack of coordination: Secondary | ICD-10-CM | POA: Diagnosis not present

## 2021-07-09 DIAGNOSIS — M6281 Muscle weakness (generalized): Secondary | ICD-10-CM | POA: Insufficient documentation

## 2021-07-09 NOTE — Patient Instructions (Signed)
Access Code: [email or web address] ?URL: [email or web address] ?Date: 07/09/2021 ?Prepared by: Earlie Counts ? ?Exercises ?Supine Diaphragmatic Breathing - 2 x daily - 7 x weekly - 1 sets - 10 reps ?Seated Diaphragmatic Breathing - 3 x daily - 7 x weekly - 1 sets - 10 reps ?Positioning on Toilet and Defecation Technique - 1 x daily - 7 x weekly - 3 sets - 10 reps ?Supine Bridge with Mini Swiss Ball Between Knees - 1 x daily - 7 x weekly - 1 sets - 10 reps ?Hooklying Isometric Hip Flexion - 1 x daily - 7 x weekly - 1 sets - 10 reps ?Seated Pelvic Floor Contraction - 3 x daily - 7 x weekly - 1 sets - 10 reps - 10 hold ?Seated Quick Flick Pelvic Floor Contractions - 1 x daily - 7 x weekly - 1 sets - 5 reps ?Supine Transversus Abdominis Bracing - Hands on Ground - 1 x daily - 7 x weekly - 1 sets - 10 reps - 5 sec hold ?Diaphragmatic Breathing at 90/90 Supported - 1 x daily - 7 x weekly - 1 sets - 10 reps ?Standing Pelvic Floor Contraction - 3 x daily - 7 x weekly - 1 sets - 5 reps - 5sec hold ?Seated Hip Abduction with Resistance - 1 x daily - 7 x weekly - 2 sets - 10 reps ?Seated Pelvic Floor Contraction with Isometric Hip Adduction - 1 x daily - 7 x weekly - 1 sets - 10 reps - 5 sec hold ?Seated March - 1 x daily - 7 x weekly - 1 sets - 10 reps ?Seated Abdominal Press into [employer] - 1 x daily - 7 x weekly - 1 sets - 10 reps - 5 sec hold ?[place] ?[address], [address] ?[place], [place] [address] ?Phone # [phone] ?Fax [phone] ? ?

## 2021-07-09 NOTE — Telephone Encounter (Signed)
07/09/21-Called patient and completed GAD-7 anxiety review call. ? ?Total time spent: 30 Minutes ?[name], [facility] ?

## 2021-07-09 NOTE — Therapy (Signed)
[facility] ?[facility] ?[place], [place], [address] ?Phone: [phone]   Fax:  [phone] ? ?Physical Therapy Treatment ? ?Patient Details  ?Name: [name] ?MRN: [id number] ?Date of Birth: 06-11-1950 ?Referring Provider (PT): Dr. Michael Boston ? ? ?Encounter Date: 07/09/2021 ? ? PT End of Session - 07/09/21 1144   ? ? Visit Number 8   ? Date for PT Re-Evaluation 08/04/21   ? Authorization Type healthteam   ? PT Start Time 1100   ? PT Stop Time 1140   ? PT Time Calculation (min) 40 min   ? Activity Tolerance Patient tolerated treatment well;Patient limited by pain   ? Behavior During Therapy [facility] for tasks assessed/performed   ? ?  ?  ? ?  ? ? ?Past Medical History:  ?Diagnosis Date  ? ADHD (attention deficit hyperactivity disorder)   ? Chronic diastolic CHF (congestive heart failure) ([place])   ? cardriologist--- dr Viona Gilmore. Audie Box;   dx in setting sepsis 11/ 2020;   echo in epic 03-10-2019 ef 55-60%, G1DD, mild to moderate TR no stenosis and normal RASP;  CTA 01-31-2021 epic  ? Chronic kidney disease (CKD), stage III (moderate) ([facility])   ? nephrologist--- dr Amedeo Gory  ? Depression   ? Diverticulosis of colon   ? Emphysema of lung ([place])   ? no home oxygen  ? GERD (gastroesophageal reflux disease)   ? Hiatal hernia   ? History of acute pyelonephritis   ? w/ hx sepsis  ? History of Helicobacter pylori infection 2012  ? History of recurrent UTIs   ? History of TIA (transient ischemic attack)   ? TIAs  ? Hyperlipidemia, mixed   ? Hypertension   ? followed by pcp and nephrologist----  (nuclear stress test in epic 06-28-2015 low risk no ischemia, nuclear ef 60%)  ? Hypothyroidism   ? IBS (irritable bowel syndrome)   ? IC (interstitial cystitis)   ? remote hx  ? Inguinal hernia, bilateral   ? Irritable bowel syndrome with constipation and diarrhea   ? LBBB (left bundle branch block) 2015  ? cardiologist --- dr Audie Box;  known chronic LBBB for several yrs  ? Migraines   ?  OCD (obsessive compulsive disorder)   ? OSA (obstructive sleep apnea)   ? no CPAP intolerant  ? Osteopenia   ? Prolapsed hemorrhoids   ? Seasonal allergies   ? Vitamin D deficiency   ? ? ?Past Surgical History:  ?Procedure Laterality Date  ? BREAST ENHANCEMENT SURGERY Bilateral   ? 1970s  ? BREAST IMPLANT REMOVAL Bilateral 03/07/2019  ? BUNIONECTOMY Bilateral 1997  ? CATARACT EXTRACTION W/ INTRAOCULAR LENS IMPLANT Right 2005  ? COLONOSCOPY  10/2020  ? ESOPHAGOGASTRODUODENOSCOPY (EGD) WITH PROPOFOL N/A 03/12/2019  ? Procedure: ESOPHAGOGASTRODUODENOSCOPY (EGD) WITH PROPOFOL;  Surgeon: Carol Ada, MD;  Location: [facility];  Service: Endoscopy;  Laterality: N/A;  ? [place]  ? INGUINAL HERNIA REPAIR N/A 04/10/2021  ? Procedure: LAPAROSCOPIC LEFT INGUINAL HERNIA REPAIR AND RIGHT INGUINAL HERNII REPAIR WITH TAP BLOCK;  Surgeon: Michael Boston, MD;  Location: [facility];  Service: General;  Laterality: N/A;  ? ORIF ANKLE FRACTURE Left 08/30/2017  ? Procedure: OPEN REDUCTION INTERNAL FIXATION (ORIF) ANKLE FRACTURE;  Surgeon: Rod Can, MD;  Location: [facility];  Service: Orthopedics;  Laterality: Left;  ? TONSILLECTOMY    ? CHILD  ? ? ?There were no vitals filed for this visit. ? ? Subjective Assessment - 07/09/21  1113   ? ? Subjective I have not done my exercises due to falling last Saturday 3/4. I triped on the elevation in parking lot to prevent cars from going forward. I am hurting on the mid back and right hip. I have not seen the doctor about it. It hurts to deep breath and cough. I saw the urogynecologist and talked about a stimulator to work on the urgency. She feel the urgency is neurologic. I am first doing a diary and see her again in May. I have diarrhea again.   ? Patient Stated Goals Have control over the bowels and bladder   ? Currently in Pain? Yes   ? Pain Score 8    ? Pain Location Back   ? Pain Orientation Mid;Right   ? Pain Descriptors / Indicators Sharp   ? Pain  Type Acute pain   ? Pain Onset More than a month ago   ? Pain Frequency Intermittent   ? Aggravating Factors  hurts with deep breaths, laying down cough   ? Pain Relieving Factors be careful of movement   ? Multiple Pain Sites Yes   ? Pain Score 7   ? Pain Location Hip   ? Pain Orientation Right   ? Pain Descriptors / Indicators Sharp   ? Pain Type Acute pain   ? Pain Onset More than a month ago   ? Pain Frequency Intermittent   ? Aggravating Factors  walk, certain movements   ? Pain Relieving Factors move slowly   ? ?  ?  ? ?  ? ? ? ? ? ? ? ? ? ? ? ? ? ? ? ? ? ? ? ? [facility] Adult PT Treatment/Exercise - 07/09/21 0001   ? ?  ? Lumbar Exercises: Aerobic  ? Nustep level 5 for 7 minutes while assess patient   ?  ? Lumbar Exercises: Seated  ? Hip Flexion on Ball Strengthening;Right;Left;10 reps   seated on a chair  ? Hip Flexion on Ball Limitations with red band around knees, pelvic floor contraction   ? Other Seated Lumbar Exercises clam in sitting with red band and pelvic floor contraction 15x   ? Other Seated Lumbar Exercises seated hip adduction with ball squeeze and pelvic floor contraction holding for 5 seconds 10x; seated pressing ball into leg holding for 5sec 10x each leg   ? ?  ?  ? ?  ? ? ? ? ? ? ? ? ? ? PT Education - 07/09/21 1139   ? ? Education Details Access Code: [email or web address]   ? Person(s) Educated Patient   ? Methods Explanation;Demonstration;Verbal cues;Handout   ? Comprehension Returned demonstration;Verbalized understanding   ? ?  ?  ? ?  ? ? ? PT Short Term Goals - 06/23/21 1459   ? ?  ? PT SHORT TERM GOAL #1  ? Title independent with initial HEP for core and pelvic floor   ? Time 4   ? Period Weeks   ? Status Achieved   ?  ? PT SHORT TERM GOAL #2  ? Title able to perfrom a full circular contraction of the anus and vagina   ? Baseline circular contraction of the anus   ? Time 4   ? Period Weeks   ? Status Achieved   ?  ? PT SHORT TERM GOAL #3  ? Title understand ways to add fiber to increase stool  consistency   ? Time 4   ? Period Weeks   ?  Status Achieved   ? ?  ?  ? ?  ? ? ? ? PT Long Term Goals - 06/23/21 1500   ? ?  ? PT LONG TERM GOAL #1  ? Title independent with advanced HEP for core and pelvic floor strength   ? Time 12   ? Period Weeks   ? Status On-going   ?  ? PT LONG TERM GOAL #2  ? Title wears </= 1 pad per day and night due to increased pelvic floor strength and reduction of leakage   ? Baseline 2 pads in 24 hours and places a top pad on it.   ? Time 12   ? Period Weeks   ? Status On-going   ?  ? PT LONG TERM GOAL #3  ? Title able to push her hterapist finger out of the rectum so she will be able to push her stool out without a struggle   ? Time 12   ? Period Weeks   ? Status On-going   ?  ? PT LONG TERM GOAL #4  ? Title rectal strength >/= 3/5 so her stool leakage is decreased >/= 75%   ? Time 12   ? Period Weeks   ? Status On-going   ?  ? PT LONG TERM GOAL #5  ? Title vaginal strength >/= 3/5 to reduce her urinary leakage >/= 60%   ? Time 12   ? Period Weeks   ? Status On-going   ? ?  ?  ? ?  ? ? ? ? ? ? ? ? Plan - 07/09/21 1140   ? ? Clinical Impression Statement Patient feel over a week ago and hurt her right hip and right mid post. rib cage. Therapist advised her to be assessed by her doctor. Patient HEP was revised due to her pain and she did them in sitting. Patient has not been able to do her exercises due to the pain and having trouble contracting the pelvic floor. She has had increased diarrhea and has not leaked stool with that. Patient walks with a limp on the right. Patient will be looking into a neurostimulator for her urge incontinence. Patient will benefit from skilled therapy to improve pelvic floor strength and coordination to reduce her leakage.   ? Personal Factors and Comorbidities Age;Comorbidity 3+;Fitness   ? Comorbidities hernia repair and inguinal hernia repair 04/10/2021; vaginal hysterectomy; COPD; IBS; Chronic kidney disease   ? Examination-Activity Limitations  Stand;Toileting;Continence;Locomotion Level;Lift   ? Examination-Participation Restrictions Community Activity;Cleaning   ? Stability/Clinical Decision Making Evolving/Moderate complexity   ? Rehab Potential Good

## 2021-07-16 ENCOUNTER — Ambulatory Visit: Payer: PPO | Admitting: Physical Therapy

## 2021-07-16 ENCOUNTER — Other Ambulatory Visit: Payer: Self-pay

## 2021-07-16 ENCOUNTER — Encounter: Payer: Self-pay | Admitting: Physical Therapy

## 2021-07-16 DIAGNOSIS — M6281 Muscle weakness (generalized): Secondary | ICD-10-CM

## 2021-07-16 DIAGNOSIS — R279 Unspecified lack of coordination: Secondary | ICD-10-CM

## 2021-07-16 NOTE — Therapy (Addendum)
[facility] [address] [place], [place], [address] Phone: [phone]   Fax:  [phone]  Physical Therapy Treatment  Patient Details  Name: [name] MRN: [id number] Date of Birth: [birth year] Referring Provider (PT): Dr. Michael Boston   Encounter Date: 07/16/2021   PT End of Session - 07/16/21 1106     Visit Number 9    Date for PT Re-Evaluation 08/04/21    Authorization Type healthteam    PT Start Time 1100    PT Stop Time 1140    PT Time Calculation (min) 40 min    Activity Tolerance Patient tolerated treatment well;No increased pain    Behavior During Therapy WFL for tasks assessed/performed             Past Medical History:  Diagnosis Date   ADHD (attention deficit hyperactivity disorder)    Chronic diastolic CHF (congestive heart failure) ([place])    cardriologist--- dr Viona Gilmore. Audie Box;   dx in setting sepsis 11/ 2020;   echo in epic 03-10-2019 ef 55-60%, G1DD, mild to moderate TR no stenosis and normal RASP;  CTA 01-31-2021 epic   Chronic kidney disease (CKD), stage III (moderate) ([place])    nephrologist--- dr Amedeo Gory   Depression    Diverticulosis of colon    Emphysema of lung ([place])    no home oxygen   GERD (gastroesophageal reflux disease)    Hiatal hernia    History of acute pyelonephritis    w/ hx sepsis   History of Helicobacter pylori infection 2012   History of recurrent UTIs    History of TIA (transient ischemic attack)    TIAs   Hyperlipidemia, mixed    Hypertension    followed by pcp and nephrologist----  (nuclear stress test in epic 06-28-2015 low risk no ischemia, nuclear ef 60%)   Hypothyroidism    IBS (irritable bowel syndrome)    IC (interstitial cystitis)    remote hx   Inguinal hernia, bilateral    Irritable bowel syndrome with constipation and diarrhea    LBBB (left bundle branch block) 2015   cardiologist --- dr Audie Box;  known chronic LBBB for several yrs   Migraines    OCD  (obsessive compulsive disorder)    OSA (obstructive sleep apnea)    no CPAP intolerant   Osteopenia    Prolapsed hemorrhoids    Seasonal allergies    Vitamin D deficiency     Past Surgical History:  Procedure Laterality Date   BREAST ENHANCEMENT SURGERY Bilateral    1970s   BREAST IMPLANT REMOVAL Bilateral 03/07/2019   BUNIONECTOMY Bilateral 1997   CATARACT EXTRACTION W/ INTRAOCULAR LENS IMPLANT Right 2005   COLONOSCOPY  10/2020   ESOPHAGOGASTRODUODENOSCOPY (EGD) WITH PROPOFOL N/A 03/12/2019   Procedure: ESOPHAGOGASTRODUODENOSCOPY (EGD) WITH PROPOFOL;  Surgeon: Carol Ada, MD;  Location: [facility];  Service: Endoscopy;  Laterality: N/A;   HEMORRHOID SURGERY  1976   INGUINAL HERNIA REPAIR N/A 04/10/2021   Procedure: LAPAROSCOPIC LEFT INGUINAL HERNIA REPAIR AND RIGHT INGUINAL HERNII REPAIR WITH TAP BLOCK;  Surgeon: Michael Boston, MD;  Location: [facility];  Service: General;  Laterality: N/A;   ORIF ANKLE FRACTURE Left 08/30/2017   Procedure: OPEN REDUCTION INTERNAL FIXATION (ORIF) ANKLE FRACTURE;  Surgeon: Rod Can, MD;  Location: [facility];  Service: Orthopedics;  Laterality: Left;   TONSILLECTOMY     CHILD    There were no vitals filed for this visit.   Subjective Assessment - 07/16/21 1107  Subjective I have concentrated on the exercises that are in standing. I was able to hold the urine to get to the bathroom and not leak for first time in years.    Patient Stated Goals Have control over the bowels and bladder    Currently in Pain? Yes    Pain Score 4     Pain Location Hip    Pain Orientation Right    Pain Descriptors / Indicators Sharp    Pain Type Acute pain    Pain Onset More than a month ago    Pain Frequency Intermittent    Aggravating Factors  when she moves    Pain Relieving Factors not moving    Multiple Pain Sites No                               [facility] Adult PT Treatment/Exercise - 07/16/21 0001        Lumbar Exercises: Aerobic   Nustep level 5 for 7 minutes while assess patient      Lumbar Exercises: Seated   Sit to Stand 10 reps    Sit to Stand Limitations with pulling on green band and contract the pelvic floor      Knee/Hip Exercises: Standing   Hip Flexion Stengthening;Right;Left;1 set;10 reps    Hip Flexion Limitations with pelvic floor contraction    Hip Abduction Stengthening;Right;Left;1 set;10 reps;Knee straight   red band at counter   Abduction Limitations Patient has to slide the left foot out due to pain in the right hip    Hip Extension Stengthening;Right;Left;1 set;10 reps;Knee straight    Extension Limitations pelvic floor contraction                     PT Education - 07/16/21 1138     Education Details Access Code: [place]    Person(s) Educated Patient    Methods Explanation;Demonstration;Verbal cues;Handout    Comprehension Returned demonstration;Verbalized understanding              PT Short Term Goals - 06/23/21 1459       PT SHORT TERM GOAL #1   Title independent with initial HEP for core and pelvic floor    Time 4    Period Weeks    Status Achieved      PT SHORT TERM GOAL #2   Title able to perfrom a full circular contraction of the anus and vagina    Baseline circular contraction of the anus    Time 4    Period Weeks    Status Achieved      PT SHORT TERM GOAL #3   Title understand ways to add fiber to increase stool consistency    Time 4    Period Weeks    Status Achieved               PT Long Term Goals - 07/16/21 1133       PT LONG TERM GOAL #1   Title independent with advanced HEP for core and pelvic floor strength    Time 12    Period Weeks    Status On-going    Target Date 08/04/21      PT LONG TERM GOAL #2   Title wears </= 1 pad per day and night due to increased pelvic floor strength and reduction of leakage    Baseline 2 pads in 24 hours and places a top pad on  it. and due to the pads being  expensive. She uses the long pad and a thin one top    Time 12    Period Weeks    Status On-going    Target Date 08/04/21      PT LONG TERM GOAL #3   Title able to push her therapist finger out of the rectum so she will be able to push her stool out without a struggle    Time 12    Period Weeks    Status On-going    Target Date 08/04/21      PT LONG TERM GOAL #4   Title rectal strength >/= 3/5 so her stool leakage is decreased >/= 75%    Time 12    Period Weeks    Status On-going    Target Date 08/04/21      PT LONG TERM GOAL #5   Title vaginal strength >/= 3/5 to reduce her urinary leakage >/= 60%    Time 12    Period Weeks    Status On-going    Target Date 08/04/21                   Plan - 07/16/21 1129     Clinical Impression Statement Patient was able to hold her urine 1 time as she walked to the bathroom for the first time in years. Patient has learned exercises in standing to work her hips and pelvic floor. She mostly leak in standing. Patient leaked stool one time when she had increased diarrhea. The stool leakage aggravated the patient rectal area so internal work was not justified. Patient will benefit from skilled therapy to improve pelvic floor strength and coordination.    Personal Factors and Comorbidities Age;Comorbidity 3+;Fitness    Comorbidities hernia repair and inguinal hernia repair 04/10/2021; vaginal hysterectomy; COPD; IBS; Chronic kidney disease    Examination-Activity Limitations Stand;Toileting;Continence;Locomotion Level;Lift    Examination-Participation Restrictions Community Activity;Cleaning    Rehab Potential Good    PT Frequency 2x / week    PT Duration 12 weeks    PT Treatment/Interventions ADLs/Self Care Home Management;Biofeedback;Therapeutic activities;Therapeutic exercise;Neuromuscular re-education;Patient/family education;Manual techniques;Taping;Joint Manipulations;Spinal Manipulations    PT Next Visit Plan See how the new  standing exercises are doing, pelvic EMG with exercises; test rectal strength    PT Home Exercise Plan Access Code: [email or web address]    Consulted and Agree with Plan of Care Patient             Patient will benefit from skilled therapeutic intervention in order to improve the following deficits and impairments:  Decreased coordination, Increased fascial restricitons, Decreased endurance, Decreased activity tolerance, Decreased strength  Visit Diagnosis: Muscle weakness (generalized)  Unspecified lack of coordination     Problem List Patient Active Problem List   Diagnosis Date Noted   Coronary atherosclerosis of native coronary artery (per CTA 01/31/2021)  06/14/2021   Incontinence of feces 12/23/2020   Interstitial cystitis 06/14/2020   Overweight (BMI 25.0-29.9) 06/13/2020   Former smoker (40 pack year, quit 2004) 08/30/2019   Atherosclerosis of aorta ([facility]) 03/22/2019   Emphysema lung ([facility])    Chronic diastolic CHF (congestive heart failure) ([place]) 03/11/2019   Osteopenia 01/16/2019   History of colon polyps 06/28/2018   CKD Stage 3 ([place]) 01/10/2015   IBS  01/10/2015   Hyperlipidemia, mixed 06/12/2014   Medication management 06/12/2014   LBBB (left bundle branch block) 07/24/2013   HTN (hypertension) 07/24/2013   Major depression, recurrent, full remission ([place])  OCD (obsessive compulsive disorder)    Abnormal glucose    Migraines    Vitamin D deficiency    Hypothyroidism 04/05/2013    [name], PT 07/16/21 11:41 AM   [facility] @ [address] [place], [place], [address] Phone: [phone]   Fax:  [phone]  Name: [name] MRN: [id number] Date of Birth: 09/19/1950  PHYSICAL THERAPY DISCHARGE SUMMARY  Visits from Start of Care: 9  Current functional level related to goals / functional outcomes: See above. Unable to assess patient due to her not returning after 07/16/2021.    Remaining  deficits: See above.    Education / Equipment: HEP   Patient agrees to discharge. Patient goals were not met. Patient is being discharged due to not returning since the last visit. Thank you for the referral. [name], PT 09/15/21 4:01 PM

## 2021-07-16 NOTE — Patient Instructions (Signed)
Access Code: [email or web address] ?URL: [email or web address] ?Date: 07/16/2021 ?Prepared by: Earlie Counts ? ?Exercises ?Standing Hip Abduction with Resistance at Ankles and Counter Support - 1 x daily - 3 x weekly - 1 sets - 10 reps ?Standing Hip Extension with Resistance at Ankles and Counter Support - 1 x daily - 3 x weekly - 1 sets - 10 reps ?[facility] ?[address] ?[place], [place] [address] ?Phone # [phone] ?Fax [phone] ? ?

## 2021-07-18 DIAGNOSIS — Z87448 Personal history of other diseases of urinary system: Secondary | ICD-10-CM | POA: Diagnosis not present

## 2021-07-18 DIAGNOSIS — N390 Urinary tract infection, site not specified: Secondary | ICD-10-CM | POA: Diagnosis not present

## 2021-07-18 DIAGNOSIS — I129 Hypertensive chronic kidney disease with stage 1 through stage 4 chronic kidney disease, or unspecified chronic kidney disease: Secondary | ICD-10-CM | POA: Diagnosis not present

## 2021-07-18 DIAGNOSIS — E039 Hypothyroidism, unspecified: Secondary | ICD-10-CM | POA: Diagnosis not present

## 2021-07-18 DIAGNOSIS — E559 Vitamin D deficiency, unspecified: Secondary | ICD-10-CM | POA: Diagnosis not present

## 2021-07-18 DIAGNOSIS — N183 Chronic kidney disease, stage 3 unspecified: Secondary | ICD-10-CM | POA: Diagnosis not present

## 2021-07-23 ENCOUNTER — Encounter: Payer: PPO | Admitting: Physical Therapy

## 2021-07-25 DIAGNOSIS — I10 Essential (primary) hypertension: Secondary | ICD-10-CM | POA: Diagnosis not present

## 2021-07-25 DIAGNOSIS — E039 Hypothyroidism, unspecified: Secondary | ICD-10-CM | POA: Diagnosis not present

## 2021-07-25 DIAGNOSIS — I5032 Chronic diastolic (congestive) heart failure: Secondary | ICD-10-CM | POA: Diagnosis not present

## 2021-07-30 ENCOUNTER — Encounter: Payer: PPO | Admitting: Physical Therapy

## 2021-08-07 ENCOUNTER — Encounter: Payer: Self-pay | Admitting: Nurse Practitioner

## 2021-08-07 ENCOUNTER — Ambulatory Visit (INDEPENDENT_AMBULATORY_CARE_PROVIDER_SITE_OTHER): Payer: PPO | Admitting: Nurse Practitioner

## 2021-08-07 ENCOUNTER — Other Ambulatory Visit: Payer: Self-pay | Admitting: Nurse Practitioner

## 2021-08-07 VITALS — BP 110/64 | HR 59 | Temp 97.5°F | Wt 169.0 lb

## 2021-08-07 DIAGNOSIS — R0981 Nasal congestion: Secondary | ICD-10-CM | POA: Diagnosis not present

## 2021-08-07 DIAGNOSIS — R300 Dysuria: Secondary | ICD-10-CM

## 2021-08-07 DIAGNOSIS — N3010 Interstitial cystitis (chronic) without hematuria: Secondary | ICD-10-CM | POA: Diagnosis not present

## 2021-08-07 DIAGNOSIS — R052 Subacute cough: Secondary | ICD-10-CM | POA: Diagnosis not present

## 2021-08-07 DIAGNOSIS — J432 Centrilobular emphysema: Secondary | ICD-10-CM | POA: Diagnosis not present

## 2021-08-07 MED ORDER — LEVOFLOXACIN 750 MG PO TABS: 750.0000 mg | ORAL_TABLET | Freq: Every day | ORAL | 0 refills | Status: AC

## 2021-08-07 MED ORDER — LEVOFLOXACIN 750 MG PO TABS: 750.0000 mg | ORAL_TABLET | Freq: Every day | ORAL | 0 refills | Status: DC

## 2021-08-07 NOTE — Progress Notes (Signed)
Assessment and Plan: ? ?[name] was seen today for an episodic visit. ? ?Diagnoses and all order for this visit: ? ?1. Interstitial cystitis/Dysuria ?Start Levaquin for empirical tmt of UTI. ?Consider cranberry supplement. ?Stay well hydrated to help flush system ?Continue to follow with UroGyn. ? ?- Urinalysis w microscopic + reflex cultur ? ?3. Centrilobular emphysema (HCC)/Cough/Nasal Congestion ?Start Levaquin for tmt of COPD flare ?OTC antihistamine PRN. ?Continue to stay well hydrated to keep mucus think and productive. ?Avoid triggers.  ? ?RTC if s/s fail to improve.  ? ?Further disposition pending results of labs. Discussed med's effects and SE's.   ?Over 20 minutes of exam, counseling, chart review, and critical decision making was performed.  ? ?Future Appointments  ?Date Time Provider [place]  ?08/27/2021  8:20 AM Jaquita Folds, MD [facility]  ?09/09/2021  2:30 PM [place], Osa Craver, RPH [facility] None  ?10/15/2021 11:30 AM Liane Comber, NP [facility] None  ?11/18/2021  7:30 AM [facility] DX DEXA 1 [facility]  ?02/17/2022  9:30 AM Unk [name], MD [facility] None  ?04/28/2022 11:00 AM Newton Pigg, RPH [facility] None  ?06/16/2022 10:00 AM Liane Comber, NP [facility] None  ? ? ?------------------------------------------------------------------------------------------------------------------ ? ? ?HPI ?BP 110/64   Pulse (!) 59   Temp (!) 97.5 ?F (36.4 ?C)   Wt 169 lb (76.7 kg)   SpO2 97%   BMI 29.94 kg/m?  ? ?71 y.o.female presents for symptoms of UTI and IS flare.  Endorses increase in urinary frequency,urgency, incontinence, and pain.  Reports making spaghetti and drinking red wine which triggered the symptoms.  She is followed by Uro-Gyn and is currently deciding to have electric stimulation implant placed for control of symptoms.  She has completed rehab for pelvic floor strengthening which has helped.   ? ?Patient is also concerned for cough, nasal congestion,  wheezing over the last four days.  Took a home Covid test that was negative. Has  a hx of emphysema.  Former smoker for 40 years.  Quit 2004.  She has had to use her Albuterol inhaler 2x weekly.  She is not on a maintenance inhaler and refuses.  She has not had to use her nebulizer.  Reports productive cough with thick green mucous.   Denies fever, chills, N/V.   ? ?Past Medical History:  ?Diagnosis Date  ? ADHD (attention deficit hyperactivity disorder)   ? Chronic diastolic CHF (congestive heart failure) ([place])   ? cardriologist--- dr Viona Gilmore. Audie Box;   dx in setting sepsis 11/ 2020;   echo in epic 03-10-2019 ef 55-60%, G1DD, mild to moderate TR no stenosis and normal RASP;  CTA 01-31-2021 epic  ? Chronic kidney disease (CKD), stage III (moderate) ([facility])   ? nephrologist--- dr Amedeo Gory  ? Depression   ? Diverticulosis of colon   ? Emphysema of lung ([place])   ? no home oxygen  ? GERD (gastroesophageal reflux disease)   ? Hiatal hernia   ? History of acute pyelonephritis   ? w/ hx sepsis  ? History of Helicobacter pylori infection 2012  ? History of recurrent UTIs   ? History of TIA (transient ischemic attack)   ? TIAs  ? Hyperlipidemia, mixed   ? Hypertension   ? followed by pcp and nephrologist----  (nuclear stress test in epic 06-28-2015 low risk no ischemia, nuclear ef 60%)  ? Hypothyroidism   ? IBS (irritable bowel syndrome)   ? IC (interstitial cystitis)   ? remote hx  ? Inguinal hernia, bilateral   ?  Irritable bowel syndrome with constipation and diarrhea   ? LBBB (left bundle branch block) 2015  ? cardiologist --- dr Audie Box;  known chronic LBBB for several yrs  ? Migraines   ? OCD (obsessive compulsive disorder)   ? OSA (obstructive sleep apnea)   ? no CPAP intolerant  ? Osteopenia   ? Prolapsed hemorrhoids   ? Seasonal allergies   ? Vitamin D deficiency   ?  ? ?Allergies  ?Allergen Reactions  ? Ace Inhibitors Other (See Comments)  ?  Acute renal failure (lisinopril)  ? Cephalexin Anaphylaxis  ? Dilaudid  [Hydromorphone Hcl] Anaphylaxis  ? Robaxin [Methocarbamol] Anaphylaxis  ? Macrobid [Nitrofurantoin] Other (See Comments)  ?  Causes hyperthermia ?  ? Neurontin [Gabapentin]   ?  Confusion   ? Codeine Nausea Only  ? Latex Rash  ? Sulfa Antibiotics Other (See Comments)  ?  Do not take per nephrology  ? ? ?Current Outpatient Medications on File Prior to Visit  ?Medication Sig  ? acetaminophen (TYLENOL) 500 MG tablet Take 500 mg by mouth every 6 (six) hours as needed.  ? albuterol (VENTOLIN HFA) 108 (90 Base) MCG/ACT inhaler Inhale 1-2 puffs into the lungs every 4 (four) hours as needed for wheezing or shortness of breath. (Patient taking differently: Inhale 1-2 puffs into the lungs every 4 (four) hours as needed for wheezing or shortness of breath.)  ? ALPRAZolam (XANAX) 0.5 MG tablet TAKE 1/2 TO 1 TABLET ONCE A DAY IF NEEDED FOR SEVERE ANXIETY OR SLEEP. **Use sparingly to prevent addiction and tolerance.  ? aspirin EC 81 MG tablet Take 81 mg by mouth daily.   ? aspirin-acetaminophen-caffeine (EXCEDRIN MIGRAINE) 250-250-65 MG tablet Take 1 tablet by mouth every 6 (six) hours as needed for headache.  ? atenolol (TENORMIN) 50 MG tablet Take 1 tablet by mouth daily for blood pressure.  ? B Complex Vitamins (B COMPLEX PO) Take 1 capsule by mouth daily.  ? buPROPion (WELLBUTRIN XL) 150 MG 24 hr tablet Take 1 tablet (150 mg total) by mouth daily.  ? calcium carbonate (TUMS - DOSED IN MG ELEMENTAL CALCIUM) 500 MG chewable tablet Chew 2 tablets by mouth daily.  ? cetirizine (ZYRTEC) 10 MG tablet Take 10 mg by mouth daily.  ? Cholecalciferol (VITAMIN D-3) 5000 UNITS TABS Take 5,000 Units by mouth daily.   ? Cinnamon 500 MG capsule Take 500 mg by mouth daily.  ? diphenhydrAMINE (BENADRYL) 25 MG tablet Take 25 mg by mouth every 6 (six) hours as needed.  ? estradiol (ESTRACE) 0.1 MG/GM vaginal cream Place 0.5 g vaginally 2 (two) times a week. Place 0.5g nightly for two weeks then twice a week after  ? famotidine (PEPCID) 20 MG  tablet TAKE 1 TABLET BY MOUTH TWICE DAILY FOR INDIGESTION AND HEARTBURN (Patient taking differently: Take 40 mg by mouth 2 (two) times daily.)  ? guaifenesin (HUMIBID E) 400 MG TABS tablet Take 400 mg by mouth daily.  ? hyoscyamine (LEVSIN SL) 0.125 MG SL tablet DISSOLVE ONE TABLET UNDER THE TONGUE EVERY 4 HOURS IF NEEDED FOR NAUSEA, BLOATING, CRAMPING, OR DIARRHEA  ? ipratropium-albuterol (DUONEB) 0.5-2.5 (3) MG/3ML SOLN USE 1 VIAL IN NEBULIZER EVERY 4 HOURS AS NEEDED **MAX 6 DOSES A DAY**  ? levothyroxine (SYNTHROID) 125 MCG tablet Take 1 tablet by mouth on Monday, Wednesday and Friday and 1/2 tab all other days. Take on an empty stomach with only water for 30 minutes & no antacid calcium or magnesium for 4 hours & avoid biotin  ?  Magnesium 400 MG CAPS Take 1 capsule by mouth daily.  ? ondansetron (ZOFRAN) 8 MG tablet TAKE 1 TABLET BY MOUTH 3 TIMES A DAY AS NEEDED FOR NAUSEA (Patient taking differently: Take 8 mg by mouth every 8 (eight) hours as needed. TAKE 1 TABLET BY MOUTH 3 TIMES A DAY AS NEEDED FOR NAUSEA)  ? pantoprazole (PROTONIX) 20 MG tablet Take 20 mg by mouth daily.  ? sertraline (ZOLOFT) 100 MG tablet Take 1 tablet by mouth daily for mood (Patient taking differently: Take 100 mg by mouth daily. Take 1 tablet by mouth daily for mood)  ? Turmeric 500 MG CAPS Take 1 tablet by mouth daily.  ? vitamin C (ASCORBIC ACID) 500 MG tablet Take 500 mg by mouth daily.  ? vitamin E 180 MG (400 UNITS) capsule Take 400 Units by mouth daily.  ? ?No current facility-administered medications on file prior to visit.  ? ? ?ROS: all negative except what is noted in the HPI.  ? ?Physical Exam: ? ?BP 110/64   Pulse (!) 59   Temp (!) 97.5 ?F (36.4 ?C)   Wt 169 lb (76.7 kg)   SpO2 97%   BMI 29.94 kg/m?  ? ?General Appearance: NAD.  Awake, conversant and cooperative. ?Eyes: PERRLA, EOMs intact.  Sclera white.  Conjunctiva without erythema. ?Sinuses: No frontal/maxillary tenderness.  No nasal discharge. Nares patent.   ?ENT/Mouth: Ext aud canals clear.  Bilateral TMs w/DOL and without erythema or bulging. Hearing intact.  Posterior pharynx without swelling or exudate.  Tonsils without swelling or erythema.  ?Neck: Supple.  No masses, nodules or t

## 2021-08-09 LAB — URINALYSIS W MICROSCOPIC + REFLEX CULTURE
Bilirubin Urine: NEGATIVE
Glucose, UA: NEGATIVE
Hyaline Cast: NONE SEEN /LPF
Ketones, ur: NEGATIVE
Nitrites, Initial: NEGATIVE
Specific Gravity, Urine: 1.019 (ref 1.001–1.035)
WBC, UA: >=60 /HPF — AB (ref 0–5)
pH: 5.5 (ref 5.0–8.0)

## 2021-08-09 LAB — URINE CULTURE
MICRO NUMBER:: 13262779
SPECIMEN QUALITY:: ADEQUATE

## 2021-08-09 LAB — CULTURE INDICATED

## 2021-08-11 ENCOUNTER — Encounter: Payer: Self-pay | Admitting: Nurse Practitioner

## 2021-08-22 ENCOUNTER — Other Ambulatory Visit (HOSPITAL_COMMUNITY): Payer: Self-pay

## 2021-08-24 DIAGNOSIS — I10 Essential (primary) hypertension: Secondary | ICD-10-CM | POA: Diagnosis not present

## 2021-08-24 DIAGNOSIS — I5032 Chronic diastolic (congestive) heart failure: Secondary | ICD-10-CM | POA: Diagnosis not present

## 2021-08-24 DIAGNOSIS — E039 Hypothyroidism, unspecified: Secondary | ICD-10-CM | POA: Diagnosis not present

## 2021-08-27 ENCOUNTER — Encounter: Payer: Self-pay | Admitting: Obstetrics and Gynecology

## 2021-08-27 ENCOUNTER — Ambulatory Visit: Payer: PPO | Admitting: Obstetrics and Gynecology

## 2021-08-27 VITALS — BP 126/75 | HR 56

## 2021-08-27 DIAGNOSIS — N393 Stress incontinence (female) (male): Secondary | ICD-10-CM | POA: Diagnosis not present

## 2021-08-27 DIAGNOSIS — N3941 Urge incontinence: Secondary | ICD-10-CM | POA: Diagnosis not present

## 2021-08-27 DIAGNOSIS — N816 Rectocele: Secondary | ICD-10-CM | POA: Diagnosis not present

## 2021-08-27 DIAGNOSIS — R159 Full incontinence of feces: Secondary | ICD-10-CM

## 2021-08-27 NOTE — Progress Notes (Signed)
[facility] ?Return Visit ? ?SUBJECTIVE  ?History of Present Illness: ?[name] is a 71 y.o. female seen in follow-up for mixed incontinence, prolapse and bowel leakage. Plan at last visit was to start physical therapy.  ? ?Had some benefit from physical therapy. ?Had a UTI on 08/07/21 (E. Coli). Pain has improved. Has been doing a lot better with PT then some of the symptoms returned with the infection. Has been using the vaginal estrogen.  ? ?Sometimes has looser stools. Diarrhea has stopped but still has some bowel leakage- small amount. Reviewed 3 day bladder diary. She is having several episodes of leakage with urgency per day. When she has loose stools she has fecal smearing- not on pad but with wiping.  ? ?Prolapse has been more bothersome for her lately and she is interested in getting this repaired.  ? ?Past Medical History: ?Patient  has a past medical history of ADHD (attention deficit hyperactivity disorder), Chronic diastolic CHF (congestive heart failure) ([place]), Chronic kidney disease (CKD), stage III (moderate) ([place]), Depression, Diverticulosis of colon, Emphysema of lung ([place]), GERD (gastroesophageal reflux disease), Hiatal hernia, History of acute pyelonephritis, History of Helicobacter pylori infection (2012), History of recurrent UTIs, History of TIA (transient ischemic attack), Hyperlipidemia, mixed, Hypertension, Hypothyroidism, IBS (irritable bowel syndrome), IC (interstitial cystitis), Inguinal hernia, bilateral, Irritable bowel syndrome with constipation and diarrhea, LBBB (left bundle branch block) (2015), Migraines, OCD (obsessive compulsive disorder), OSA (obstructive sleep apnea), Osteopenia, Prolapsed hemorrhoids, Seasonal allergies, and Vitamin D deficiency.  ? ?Past Surgical History: ?She  has a past surgical history that includes Tonsillectomy; Bunionectomy (Bilateral, 1997); Hemorrhoid surgery (1976); Breast enhancement surgery (Bilateral); ORIF ankle fracture  (Left, 08/30/2017); Breast implant removal (Bilateral, 03/07/2019); Esophagogastroduodenoscopy (egd) with propofol (N/A, 03/12/2019); Colonoscopy (10/2020); Cataract extraction w/ intraocular lens implant (Right, 2005); and Inguinal hernia repair (N/A, 04/10/2021).  ? ?Medications: ?She has a current medication list which includes the following prescription(s): acetaminophen, albuterol, alprazolam, aspirin ec, excedrin migraine, atenolol, b complex vitamins, bupropion, calcium carbonate, cetirizine, vitamin d-3, cinnamon, diphenhydramine, estradiol, famotidine, guaifenesin, hyoscyamine, ipratropium-albuterol, levothyroxine, magnesium, ondansetron, pantoprazole, sertraline, turmeric, vitamin c, and vitamin e.  ? ?Allergies: ?Patient is allergic to ace inhibitors, cephalexin, dilaudid [hydromorphone hcl], robaxin [methocarbamol], macrobid [nitrofurantoin], neurontin [gabapentin], codeine, latex, and sulfa antibiotics.  ? ?Social History: ?Patient  reports that she quit smoking about 19 years ago. Her smoking use included cigarettes. She has a 40.00 pack-year smoking history. She has never used smokeless tobacco. She reports that she does not drink alcohol and does not use drugs.  ?  ?  ?OBJECTIVE  ?  ? ?Physical Exam: ?Vitals:  ? 08/27/21 0811  ?BP: 126/75  ?Pulse: (!) 56  ? ?Gen: No apparent distress, A&O x 3. ? ?Detailed Urogynecologic Evaluation:  ?Deferred. Prior exam showed: ? ?POP-Q (07/04/21):  ?  ?POP-Q ?  ?-3  ?                                          Aa   ?-3 ?                                          Ba   ?-5.5  ?  C  ?  ?3  ?                                          Gh   ?4  ?                                          Pb   ?7  ?                                          tvl  ?  ?1  ?                                          Ap   ?1  ?                                          Bp   ?   ?                                            D  ?  ?   ? ?ASSESSMENT AND PLAN  ?   ?Ms. Firkus is a 71 y.o. with:  ?1. Prolapse of posterior vaginal wall   ?2. SUI (stress urinary incontinence, female)   ?3. Incontinence of feces, unspecified fecal incontinence type   ?4. Urge incontinence   ? ?- We reviewed the option of sacral neuromodulation for urge incontinence and bowel leakage but she feels that the prolapse is more bothersome for her at this time and wants to get this repaired first.  ?- We also discussed a sling for SUI as she demonstrated +CST last visit.  ? ?Plan for surgery: Exam under anesthesia, posterior repair, possible sacrospinous ligament fixation, midurethral sling, cystoscopy ? ?- We reviewed the patient's specific anatomic and functional findings, with the assistance of diagrams, and together finalized the above procedure. The planned surgical procedures were discussed along with the surgical risks. Additional treatment options including expectant management, conservative management, medical management were discussed where appropriate.  We reviewed the benefits and risks of each treatment option.  ? ?- For preop Visit:  She is required to have a visit within 30 days of her surgery.  ? ? ?- Medical clearance: not required  ?- Anticoagulant use: No ?- Medicaid Hysterectomy form: n/a ?- Accepts blood transfusion: will confirm at pre op ?- Expected length of stay: outpatient ? ?Request sent for surgery scheduling.  ? ?Jaquita Folds, MD ? ? ?

## 2021-09-02 ENCOUNTER — Encounter: Payer: Self-pay | Admitting: Obstetrics and Gynecology

## 2021-09-04 ENCOUNTER — Other Ambulatory Visit (HOSPITAL_COMMUNITY): Payer: Self-pay

## 2021-09-09 ENCOUNTER — Ambulatory Visit: Payer: PPO | Admitting: Pharmacy Technician

## 2021-09-09 DIAGNOSIS — E782 Mixed hyperlipidemia: Secondary | ICD-10-CM

## 2021-09-09 DIAGNOSIS — N1831 Chronic kidney disease, stage 3a: Secondary | ICD-10-CM

## 2021-09-09 DIAGNOSIS — Z79899 Other long term (current) drug therapy: Secondary | ICD-10-CM

## 2021-09-09 DIAGNOSIS — I10 Essential (primary) hypertension: Secondary | ICD-10-CM

## 2021-09-09 DIAGNOSIS — E663 Overweight: Secondary | ICD-10-CM

## 2021-09-09 DIAGNOSIS — R159 Full incontinence of feces: Secondary | ICD-10-CM

## 2021-09-10 NOTE — Progress Notes (Signed)
Follow Up Pharmacist Visit  ? [name], [name] [id number] ?22 years, Female  DOB: [birth year]  M: [phone] ?Care Team: Levy Pupa ?__________________________________________________ ?Patient's Chronic Conditions: Hypertension (HTN), Osteopenia or Osteoporosis, Hypothyroidism, Chronic Kidney Disease (CKD), Heart Failure, Depression, Hyperlipidemia/Dyslipidemia (HLD), Other, Diabetes (DM) ?List Other Conditions (separated by comma): Migraines, LBBB, Fecal incontinence, Atherosclerosis of Aorta, IBS, Vitamin D deficiency, Emphysema, OCD, Prediabetes  ? ?Summary for PCP:  ?1. LDL 122 and TC >200. Consider statin therapy. ?2. Patient splitting Atenolol in half because she thinks BP is too low ~120s/70s. ?3. Patient would like to be tested for Celiacs, H. Pylori, Lactose intolerance so see if that is precipitating fecal incontinence. ?4. Getting scheduled for bladder and rectal tac surgery. ?. ?Doctor and Hospital Visits ?Were there PCP Visits since last visit with the Pharmacist?: Yes ?Visit #1: 05/15/21-Dana Demetra Shiner, NP- Pt. presented for feeling of fullness and pain with urination, odor to urine.  Denies fevers, back pain and blood in urine ?STARTED ? ?levoFLOXacin 500 mg Oral Daily ? ?STOPPED ? ?Budeson-Glycopyrrol-Formoterol 160-9-4.8 MCG/ACT Inhale into the lungs. ?Promethazine-DM 6.25-15 MG/5ML 5 mLs Oral 4 times daily PRN ?Patient not taking:  Reported on 10/13/2022traMADol HCl 50-100 mg Oral Every 6 hours PRN ? ?Visit #2: 06/13/21-Ashley Corbett, NP-Pt. presented for AWV. No medication changes noted. ?Visit #3: 08/07/21-Tonya Cranford, NP- Pt. presented for symptoms of UTI and IS flare. ?STARTED ? ?levoFLOXacin 750 mg Oral Daily ? ? ?Were there Specialist Visits since last visit with the Pharmacist?: Yes ?Visit #1: 07/04/21-Schroeder, Governor Rooks, MD (Obstetrics and Gynecology)- Pt. presented for a consult on prolapse and incontinence. ?STARTED ? ?Estradiol 0.5 g Vaginal 2 times  weekly, Place 0.5g nightly for two weeks then twice a week after ?Sulfamethoxazole-Trimethoprim 800-160 MG 1 tablet Oral 2 times daily ? ?STOPPED ? ?buPROPion HCl 150 mg Oral Daily 150 MG Tb24, Take 3 tablets (450 mg) every morning for Mood, Focus & Concentration (Completed Course) ? ?Visit #2: 08/27/21-Schroeder, Governor Rooks, MD (Obstetrics and Gynecology)-Pt. presented for f/u. No medication changes noted. ?Was there a Hospital Visit in last 30 days?: No ?Were there other Hospital Visits since last visit with the Pharmacist?: No ?. ?Medication Information ?Have there been any medication changes from PCP or Specialist since last visit with the Pharmacist?: Yes ?Details: STARTED ? ?levoFLOXacin 500 mg Oral Daily ?levoFLOXacin 750 mg Oral Daily ?Estradiol 0.5 g Vaginal 2 times weekly, Place 0.5g nightly for two weeks then twice a week after ?Sulfamethoxazole-Trimethoprim 800-160 MG 1 tablet Oral 2 times daily ? ?STOPPED ? ?Budeson-Glycopyrrol-Formoterol 160-9-4.8 MCG/ACT Inhale into the lungs. ?Promethazine-DM 6.25-15 MG/5ML 5 mLs Oral 4 times daily PRN ?Patient not taking: Reported on 10/13/2022traMADol HCl 50-100 mg Oral Every 6 hours PRN ?buPROPion HCl 150 mg Oral Daily 150 MG Tb24, Take 3 tablets (450 mg) every morning for Mood, Focus & Concentration (Completed Course) ? ?Are there any Medication adherence gaps (beyond 5 days past due)?: No ?Medication adherence rates for the STAR rating drugs: Pt. takes no STAR rating drugs ?List Patient's current Care Gaps: No current Care Gaps identified ?Marland Kitchen ?Engagement Notes ?[name] on 09/08/2021 02:29 PM ?HC Chart/ CP prep: 20 min. ?[facility] Care Plan Completion: ?. ?Care Gaps ?COVID-19 Vaccine ?DEXA scan ?Marland Kitchen ?Pre-Call Questions (HC) ?Are you able to connect with Patient: Yes ?Confirmed appointment date/time with patient/caregiver?: Yes ?Date/time of the appointment: 09/09/21 at 2:30PM ?Visit type: Phone ?Patient/Caregiver instructed to bring medications to appointment:  Yes ?What, if any, problems do you have getting  your medications from the pharmacy?: None ?What is your top health concern to discuss at your upcoming visit?:  ?Reoccurring UTI's ? ?Subjective Information ?Current BP: 126/75 ?Current HR: 56 ?taken on: 08/27/2021 ?Weight: 169 ?BMI: 29.94 ?Last GFR: 49 ?taken on: 06/13/2021 ?Visit Completed on: 09/09/2021 ?Why did the patient present?: CCM Follow up ?What does the patient do during the day?: House work and yard work ?Lifestyle habits such as diet and exercise?: Stays active around the house and utilizes a fit bit. ?Alcohol, tobacco, and illicit drug usage?: None ?Factors that may affect medication adherence?: Pill burden ?Any additional demeanor/mood notes?: Patients follow up conducted today via phone. No copay barriers and patient utilizes a pill box for organization. Chief complaint today is recurrent UTIs and fecal incontinence. ?SDOH: [facility] Health-Related Social Needs Screening Tool ?([email or web address]) ?SDOH questions were documented and reviewed (EMR or [employer]) within the past 3 months?: No ?What is your living situation today? (ref #1): I have a steady place to live ?Think about the place you live. Do you have problems with any of the following? (ref #2): None of the above ?Within the past 12 months, you worried that your food would run out before you got money to buy more (ref #3): Never true ?Within the past 12 months, the food you bought just didn't last and you didn't have money to get more (ref #4): Never true ?In the past 12 months, has lack of reliable transportation kept you from medical appointments, meetings, work or from getting things needed for daily living? (ref #5): No ?In the past 12 months, has the electric, gas, oil, or water company threatened to shut off services in your home? (ref #6): No ?How often does anyone, including family and friends, physically hurt  you? (ref #7): Never (1) ?How often does anyone, including family and friends, insult or talk down to you? (ref #8): Never (1) ?How often does anyone, including friends and family, threaten you with harm? (ref #9): Never (1) ?How often does anyone, including family and friends, scream or curse at you? (ref #10): Never (1) ?. ?Hypertension (HTN) ?Discussed with patient today?: Yes ?Is patient able to obtain BP reading today?: Yes ?BP today is: 120/67 ?Goal: <130/80 mmHG ?Hypertension Stage: Normal (SBP <120 and DBP < 80) ?Is Patient checking BP at home?: Yes ?Patient home BP readings are ranging: 120s/70s ?How often does patient miss taking their blood pressure medications?: None ?Has patient experienced hypotension, dizziness, falls or bradycardia?: No ?Check present secondary causes (below) for HTN: Obesity, CKD ?Does Patient use RPM device?: No ?BP RPM device: Does patient qualify?: No ?We discussed: DASH diet:  following a diet emphasizing fruits and vegetables and low-fat dairy products along with whole grains, fish, poultry, and nuts. Reducing red meats and sugars., Targeting 150 minutes of aerobic activity per week, Recommend using a salt substitute to replace your salt if you need flavor., Getting enough potassium in your diet equaling 3500-'5000mg'$ /day.  This helps to regulate BP by balancing out the effects of salt., Weight reduction- We discussed losing 5-10% of body weight., Proper Home BP Measurement, Hypertension pathophysiology, complications, treatment goals, and management with patient for 10-15 minutes, Increasing movement, Increasing exercise (walking, biking, swimming) to a goal of 30 minutes per day, as able based on current activity level and health or as directed by your healthcare provider., Contacting PCP office for signs and symptoms of high or low blood pressure (hypotension, dizziness, falls, headaches, edema) ?Assessment:: Controlled ?Drug: Atenolol '50mg'$  daily ?Assessment:  Appropriate,  Effective, Safe, Accessible ?Additional Info: Patient has been cutting '50mg'$  tab in half due to thinking her BP is too low in the 120s. ?Plan to Start: Logging BP ?Plan to Counsel: Counseled on increasing water intake

## 2021-09-23 ENCOUNTER — Telehealth: Payer: Self-pay

## 2021-09-23 NOTE — Telephone Encounter (Signed)
May 2023 RPM weight readings  09/23/2021 Tuesday at 08:20 AM 168      09/22/2021 Monday at 09:28 AM 167.3      09/21/2021 'Sunday at 09:54 AM 164.9      09/20/2021 Saturday at 09:22 AM 166.4      09/19/2021 Friday at 10:01 AM 166.2      09/18/2021 Thursday at 08:59 AM 167.3      09/17/2021 Wednesday at 09:10 AM 166.9      09/16/2021 Tuesday at 08:44 AM 165.8      09/15/2021 Monday at 08:44 AM 165.6      09/14/2021 Sunday at 08:41 AM 165.3      09/13/2021 Saturday at 09:39 AM 164.5      09/12/2021 Friday at 09:31 AM 164.9      09/12/2021 Friday at 08:30 AM 168.4      09/12/2021 Friday at 08:30 AM 168.4      09/11/2021 Thursday at 09:27 AM 164.2      09/10/2021 Wednesday at 10:01 AM 164      09/09/2021 Tuesday at 09:47 AM 163.4      09/08/2021 Monday at 09:18 AM 164.9      09/07/2021 Sunday at 09:00 AM 163.4      09/06/2021 Saturday at 10:07 AM 163.4      09/05/2021 Friday at 11:28 AM 164      09/04/2021 Thursday at 07:59 AM 164      09/03/2021 Wednesday at 07:01 AM 166      09/03/2021 Wednesday at 07:00 AM 166      09/02/2021 Tuesday at 08:58 AM 165.8      09/01/2021 Monday at 07:04 PM 165.1      09/01/2021 Monday at 09:41 AM 165.3      09/01/2021 Monday at 09:41 AM 165.3      08/31/2021 Sunday at 10:07 AM 165.6      08/30/2021 Saturday at 09:01 AM 165.3      08/29/2021 Friday at 10:25 AM 166      08/28/2021 Thursday at 08:59 AM 166      08/27/2021 Wednesday at 05:39 AM 167.3      08/26/2021 Tuesday at 08:42 AM 167.1      05'$ /04/2021 Monday at 09:56 AM 168.4

## 2021-09-24 ENCOUNTER — Telehealth: Payer: Self-pay

## 2021-09-24 DIAGNOSIS — E663 Overweight: Secondary | ICD-10-CM | POA: Diagnosis not present

## 2021-09-24 DIAGNOSIS — E782 Mixed hyperlipidemia: Secondary | ICD-10-CM | POA: Diagnosis not present

## 2021-09-24 DIAGNOSIS — I10 Essential (primary) hypertension: Secondary | ICD-10-CM | POA: Diagnosis not present

## 2021-09-24 DIAGNOSIS — N1831 Chronic kidney disease, stage 3a: Secondary | ICD-10-CM | POA: Diagnosis not present

## 2021-09-24 NOTE — Telephone Encounter (Signed)
Closing encounter

## 2021-09-29 ENCOUNTER — Other Ambulatory Visit: Payer: Self-pay | Admitting: Adult Health

## 2021-09-29 DIAGNOSIS — Z1231 Encounter for screening mammogram for malignant neoplasm of breast: Secondary | ICD-10-CM

## 2021-10-15 ENCOUNTER — Ambulatory Visit (INDEPENDENT_AMBULATORY_CARE_PROVIDER_SITE_OTHER): Payer: PPO | Admitting: Adult Health

## 2021-10-15 ENCOUNTER — Encounter: Payer: Self-pay | Admitting: Adult Health

## 2021-10-15 VITALS — BP 96/60 | HR 71 | Temp 97.6°F | Wt 169.0 lb

## 2021-10-15 DIAGNOSIS — R7309 Other abnormal glucose: Secondary | ICD-10-CM

## 2021-10-15 DIAGNOSIS — Z79899 Other long term (current) drug therapy: Secondary | ICD-10-CM

## 2021-10-15 DIAGNOSIS — G43909 Migraine, unspecified, not intractable, without status migrainosus: Secondary | ICD-10-CM | POA: Diagnosis not present

## 2021-10-15 DIAGNOSIS — I5032 Chronic diastolic (congestive) heart failure: Secondary | ICD-10-CM

## 2021-10-15 DIAGNOSIS — E663 Overweight: Secondary | ICD-10-CM

## 2021-10-15 DIAGNOSIS — E782 Mixed hyperlipidemia: Secondary | ICD-10-CM

## 2021-10-15 DIAGNOSIS — I10 Essential (primary) hypertension: Secondary | ICD-10-CM | POA: Diagnosis not present

## 2021-10-15 DIAGNOSIS — I447 Left bundle-branch block, unspecified: Secondary | ICD-10-CM | POA: Diagnosis not present

## 2021-10-15 DIAGNOSIS — R159 Full incontinence of feces: Secondary | ICD-10-CM

## 2021-10-15 DIAGNOSIS — N1831 Chronic kidney disease, stage 3a: Secondary | ICD-10-CM | POA: Diagnosis not present

## 2021-10-15 DIAGNOSIS — J432 Centrilobular emphysema: Secondary | ICD-10-CM

## 2021-10-15 DIAGNOSIS — Z0001 Encounter for general adult medical examination with abnormal findings: Secondary | ICD-10-CM

## 2021-10-15 DIAGNOSIS — E039 Hypothyroidism, unspecified: Secondary | ICD-10-CM

## 2021-10-15 DIAGNOSIS — N3010 Interstitial cystitis (chronic) without hematuria: Secondary | ICD-10-CM

## 2021-10-15 DIAGNOSIS — E559 Vitamin D deficiency, unspecified: Secondary | ICD-10-CM

## 2021-10-15 DIAGNOSIS — I700 Atherosclerosis of aorta: Secondary | ICD-10-CM

## 2021-10-15 DIAGNOSIS — F3342 Major depressive disorder, recurrent, in full remission: Secondary | ICD-10-CM | POA: Diagnosis not present

## 2021-10-15 DIAGNOSIS — M8580 Other specified disorders of bone density and structure, unspecified site: Secondary | ICD-10-CM

## 2021-10-15 DIAGNOSIS — R6889 Other general symptoms and signs: Secondary | ICD-10-CM | POA: Diagnosis not present

## 2021-10-15 DIAGNOSIS — Z0000 Encounter for general adult medical examination without abnormal findings: Secondary | ICD-10-CM

## 2021-10-15 DIAGNOSIS — K589 Irritable bowel syndrome without diarrhea: Secondary | ICD-10-CM

## 2021-10-15 DIAGNOSIS — I2510 Atherosclerotic heart disease of native coronary artery without angina pectoris: Secondary | ICD-10-CM | POA: Diagnosis not present

## 2021-10-15 DIAGNOSIS — Z860100 Personal history of colon polyps, unspecified: Secondary | ICD-10-CM

## 2021-10-15 DIAGNOSIS — Z87891 Personal history of nicotine dependence: Secondary | ICD-10-CM

## 2021-10-15 MED ORDER — ROSUVASTATIN CALCIUM 5 MG PO TABS: ORAL_TABLET | ORAL | 0 refills | Status: DC

## 2021-10-15 NOTE — Progress Notes (Signed)
ANNUAL WELLNESS VISIT AND FOLLOW UP   Assessment:   Diagnoses and all orders for this visit:  Annual Medicare Wellness Visit Due annually  Health maintenance reviewed - keep upcoming mammogram and DEXA appointment   Atherosclerosis of aorta ([place]) - Per Ct 2020 Control blood pressure, cholesterol, glucose, increase exercise.   Centrilobular emphysema ([place]) Per CT 2020, denies sx, monitor   Atherosclerosis of coronary artery atherosclerosis of native artery without angina pectoris  Declines sx or cardiology referral/workup Will do medical management; continue bASA, no longer smoking Discussed statin, plaque stabilizing benefit -  receptive to low dose low frequency - rosuvastatin 5 mg 3 nights a week to start   Essential hypertension Still with fatigue/hypotensive - stop atenolol 25 mg  Monitor blood pressure at home; call if consistently over 130/80 Continue DASH diet.   Reminder to go to the ER if any CP, SOB, nausea, dizziness, severe HA, changes vision/speech, left arm numbness and tingling and jaw pain.  LBBB (left bundle branch block) Continue to monitor- cardiology Dr. Acie Fredrickson PRN per patient preference  Migraine without status migrainosus, not intractable, unspecified migraine type Reports improved recently;  No significant issues or concerns   Irritable bowel syndrome, unspecified type Symptoms stable/improved   - continue monitoring diet/lifestyle modification  Hypothyroidism, unspecified type continue medications the same pending lab results reminded to take on an empty stomach 30-59mns before food.  -     TSH  CKD Stage 3 (HCC) Increase fluids, avoid NSAIDS, monitor sugars, will monitor Continue follow up with nephrology -     CMP/GFR  Major depression, recurrent, full [place] Continue medications; rare benzo; doing well with current regimen, will try stopping wellbutrin as has been in remission over 1 year, continue sertraline Lifestyle  discussed: diet/exerise, sleep hygiene, stress management, hydration  Obsessive-compulsive disorder, unspecified type Symptoms improved on SSRI  Diverticulosis of intestine without bleeding, unspecified intestinal tract location Increase fiber intake;   Medication management -     CBC with Differential/Platelet -     CMP/GFR  Mixed hyperlipidemia Due to atherosclerosis discussed and sent in rosuvastatin 5 mg Very resistant but receptive to low dose low frequency, will try 3 days/week Continue low cholesterol diet and exercise.  -     Lipid panel  Other abnormal glucose Recent A1Cs at goal Discussed disease and risks Discussed diet/exercise, weight management  -     CMP/GFR  Vitamin D deficiency At goal at recent check; continue to recommend supplementation for goal of 60-100 Check vitamin D level annually and after dose change  Osteopenia - get dexa q2y, has upcoming scheduled 2023, continue Vit D and Ca, weight bearing exercises  CHF, diastolic ([place] Per ECHO while admitted 02/2019 No hx prior, denies notable sx since that time Dr. NJohnsie Cancelrecommended no dedicated follow up unless new sx Limit sodium; monitor weight and control BP Appears euvolemic  Hx of colon polyps Next colonoscopy due 10/2025 High fiber diet, low red meat/processed meat  Former smoker, 40 pack year, quit 2004 CT chest 08/2020 was benign, no further per radiology  Low threshold for CXR with sx, denies today   IC/pelvic floor dysfunction Has OV planned with uro/GYN, limited benefit with pelvic floor PT, has upcoming surgery planned  Diarrhea/incontinence of feces Improved off of miralax, holding magnesium if needed Avoiding lactose has been beneficial  Monitor;   Orders Placed This Encounter  Procedures   CBC with Differential/Platelet   COMPLETE METABOLIC PANEL WITH GFR   Magnesium   Lipid  panel   TSH    Over 40 minutes of exam, counseling, chart review and critical decision making was  performed Future Appointments  Date Time Provider [place]  10/16/2021 11:20 AM Jaquita Folds, MD [facility]  11/18/2021  7:00 AM GI-BCG MM 2 GI-BCGMM [facility]  11/18/2021  7:30 AM GI-BCG DX DEXA 1 GI-BCGDG GI-BRE[facility]  12/23/2021 11:20 AM Jaquita Folds, MD [facility]  02/17/2022  9:30 AM Unk [name], MD [facility] None  04/28/2022 11:00 AM Carleene Mains, RPH [facility] None  06/16/2022 10:00 AM Darrol Jump, NP [facility] None  09/15/2022 11:00 AM Darrol Jump, NP [facility] None     Plan:   During the course of the visit the patient was educated and counseled about appropriate screening and preventive services including:   Pneumococcal vaccine  Prevnar 13 Influenza vaccine Td vaccine Screening electrocardiogram Bone densitometry screening Colorectal cancer screening Diabetes screening Glaucoma screening Nutrition counseling  Advanced directives: requested   Subjective:  [name] is a 71 y.o. female who presents for AWV and follow up. She has Hypothyroidism; Major depression, recurrent, full remission ([place]); OCD (obsessive compulsive disorder); Abnormal glucose; Migraines; Vitamin D deficiency; LBBB (left bundle branch block); HTN (hypertension); Hyperlipidemia, mixed; Medication management; CKD Stage 3 ([place]); IBS ; History of colon polyps; Osteopenia; Chronic diastolic CHF (congestive heart failure) ([place]); Atherosclerosis of aorta ([place]); Emphysema lung ([place]) - per CT 03/09/2019; Former smoker (40 pack year, quit 2004); Overweight (BMI 25.0-29.9); Interstitial cystitis; and Coronary atherosclerosis of native coronary artery (per CTA 01/31/2021)  on their problem list.  She had bil inguinal hernia repair by Dr. Johney Maine 04/10/2021, doing well with no issues.   She has IC, and pelvic floor dysfunction, has upcoming appointment with new urogyn Dr. Ruby Cola, has been doing pelvic floor PT with limited benefit, scheduled for bladder/rectal tac  surgery 11/10/2021.   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   she has a diagnosis of depression/anxiety/OCD and is currently on wellbutrin 150 mg, sertraline 100 mg and xanax 0.5 mg TID PRN, reports symptoms are well controlled on current regimen. she currently reports using xanax very rarely (once a month or so).  BMI is Body mass index is 29.94 kg/m., she has not been working on diet, admits minimal intentional exercise though very active around home and her yard for several hours weather permitting, also counts steps, typically 5000 steps. Snacks on chips/goldfish, receptive to reducing processed carbs -  Wt Readings from Last 3 Encounters:  10/15/21 169 lb (76.7 kg)  08/07/21 169 lb (76.7 kg)  07/04/21 166 lb (75.3 kg)   She has aortic atherosclerosis and centrilobular emphysema per CT chest 02/2019, former smoker, quit in 2004, 40 pack year history. Inhalers only need when ill. 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   In November 2020,  patient had removal if breast implants and had post-op complications post intubation neck & facial swelling with fever & drop in O2 to 79% and all attributed to secondary to cricopharyngeal inflammation.  At this time she underwent Echo 03/10/2019 LVEF estimated 55-60%, Abnormal (paradoxical) septal motion, consistent with left bundle branch block (long history of this on EKG), Grade I diastolic dysfunctoin, she also had some elevated troponins felt to be demand related. Had follow up  with Dr. Johnsie Cancel, was not recommended dedicated follow up. LBBB followed by Dr. Johnsie Cancel.      Her blood pressure has been controlled at home (taking atnolol 25 mg only, would like to stop, has had fatigue and low BPs), today their BP is BP: 96/60 She does not workout but is doing  lots of intense housework. She denies chest pain, shortness of breath, does endorse fatigue and intermittent dizziness.   She does have aortic atherosclerosis and coronary atherosclerosis per CTA 01/31/2021. Denies angina, dyspnea. She is on bASA, BB. She is currently not on statin.   She is not on cholesterol medication, mild elevations treated by lifestyle modification, has been declining lipid program. Receptive today after extended discussion of risks/benefits. Her cholesterol is not at goal. The cholesterol last visit was:   Lab Results  Component Value Date   CHOL 208 (H) 06/13/2021   HDL 67 06/13/2021   LDLCALC 122 (H) 06/13/2021   TRIG 91 06/13/2021   CHOLHDL 3.1 06/13/2021    She has been working on diet and exercise for glucose management, and denies increased appetite, nausea, paresthesia of the feet, polydipsia, polyuria, visual disturbances and vomiting. Last A1C in the office was:  Lab Results  Component Value Date   HGBA1C 5.4 06/13/2021   She is on thyroid medication. Her medication was not changed last visit, taking 125 mcg daily Lab Results  Component Value Date   TSH 1.33 06/13/2021   She is followed by nephrology Dr. Katrine Coho for CKD III Lab Results  Component Value Date   EGFR 49 (L) 06/13/2021   Patient is on Vitamin D supplement and at goal at recent check:  Lab Results  Component Value Date   VD25OH 83 06/13/2021     Stopped B complex supplement taking irregularly Lab Results  Component Value Date   VITAMINB12 1,001 06/13/2021     Medication Review: Current Outpatient Medications on File Prior to Visit  Medication Sig Dispense Refill   acetaminophen (TYLENOL) 500 MG tablet Take 500 mg by mouth every 6 (six) hours as needed.     albuterol (VENTOLIN HFA) 108 (90 Base) MCG/ACT inhaler Inhale 1-2 puffs into the lungs every 4 (four) hours as needed for wheezing or shortness of breath. (Patient taking differently: Inhale 1-2 puffs into the lungs  every 4 (four) hours as needed for wheezing or shortness of breath.) 18 g 1   ALPRAZolam (XANAX) 0.5 MG tablet TAKE 1/2 TO 1 TABLET ONCE A DAY IF NEEDED FOR SEVERE ANXIETY OR SLEEP. **Use sparingly to prevent addiction and tolerance. 30 tablet 0   aspirin EC 81 MG tablet Take 81 mg by mouth daily.      aspirin-acetaminophen-caffeine (EXCEDRIN MIGRAINE) 250-250-65 MG tablet Take 1 tablet by mouth every 6 (six) hours as needed for headache.     B Complex Vitamins (B COMPLEX PO) Take 1 capsule by mouth daily.     buPROPion (WELLBUTRIN XL) 150 MG 24 hr tablet Take 1 tablet (150 mg total) by mouth daily. 90 tablet 3   calcium carbonate (TUMS - DOSED IN MG ELEMENTAL CALCIUM) 500 MG chewable tablet Chew 2 tablets by mouth daily.     cetirizine (ZYRTEC) 10 MG tablet Take 10 mg by mouth daily.     Cholecalciferol (VITAMIN D-3) 5000 UNITS TABS Take 5,000 Units by mouth daily.      Cinnamon 500 MG capsule Take 500 mg by mouth daily.     diphenhydrAMINE (BENADRYL) 25 MG tablet Take 25 mg  by mouth every 6 (six) hours as needed.     estradiol (ESTRACE) 0.1 MG/GM vaginal cream Place 0.5 g vaginally 2 (two) times a week. Place 0.5g nightly for two weeks then twice a week after 30 g 11   famotidine (PEPCID) 20 MG tablet TAKE 1 TABLET BY MOUTH TWICE DAILY FOR INDIGESTION AND HEARTBURN (Patient taking differently: Take 40 mg by mouth 2 (two) times daily.) 180 tablet 3   guaifenesin (HUMIBID E) 400 MG TABS tablet Take 400 mg by mouth daily.     hyoscyamine (LEVSIN SL) 0.125 MG SL tablet DISSOLVE ONE TABLET UNDER THE TONGUE EVERY 4 HOURS IF NEEDED FOR NAUSEA, BLOATING, CRAMPING, OR DIARRHEA 120 tablet 3   ipratropium-albuterol (DUONEB) 0.5-2.5 (3) MG/3ML SOLN USE 1 VIAL IN NEBULIZER EVERY 4 HOURS AS NEEDED **MAX 6 DOSES A DAY** 540 mL 0   levothyroxine (SYNTHROID) 125 MCG tablet Take 1 tablet by mouth on Monday, Wednesday and Friday and 1/2 tab all other days. Take on an empty stomach with only water for 30 minutes & no  antacid calcium or magnesium for 4 hours & avoid biotin 90 tablet 3   Magnesium 400 MG CAPS Take 1 capsule by mouth daily.     ondansetron (ZOFRAN) 8 MG tablet TAKE 1 TABLET BY MOUTH 3 TIMES A DAY AS NEEDED FOR NAUSEA (Patient taking differently: Take 8 mg by mouth every 8 (eight) hours as needed. TAKE 1 TABLET BY MOUTH 3 TIMES A DAY AS NEEDED FOR NAUSEA) 60 tablet 2   pantoprazole (PROTONIX) 20 MG tablet Take 20 mg by mouth daily.     sertraline (ZOLOFT) 100 MG tablet Take 1 tablet by mouth daily for mood (Patient taking differently: Take 100 mg by mouth daily. Take 1 tablet by mouth daily for mood) 90 tablet 3   Turmeric 500 MG CAPS Take 1 tablet by mouth daily.     vitamin C (ASCORBIC ACID) 500 MG tablet Take 500 mg by mouth daily.     No current facility-administered medications on file prior to visit.    Allergies  Allergen Reactions   Ace Inhibitors Other (See Comments)    Acute renal failure (lisinopril)   Cephalexin Anaphylaxis   Dilaudid [Hydromorphone Hcl] Anaphylaxis   Robaxin [Methocarbamol] Anaphylaxis   Macrobid [Nitrofurantoin] Other (See Comments)    Causes hyperthermia    Levaquin [Levofloxacin] Nausea And Vomiting   Neurontin [Gabapentin]     Confusion    Codeine Nausea Only   Latex Rash   Sulfa Antibiotics Other (See Comments)    Do not take per nephrology    Current Problems (verified) Patient Active Problem List   Diagnosis Date Noted   Coronary atherosclerosis of native coronary artery (per CTA 01/31/2021)  06/14/2021   Interstitial cystitis 06/14/2020   Overweight (BMI 25.0-29.9) 06/13/2020   Former smoker (40 pack year, quit 2004) 08/30/2019   Atherosclerosis of aorta ([place]) 03/22/2019   Emphysema lung ([place]) - per CT 03/09/2019    Chronic diastolic CHF (congestive heart failure) ([place]) 03/11/2019   Osteopenia 01/16/2019   History of colon polyps 06/28/2018   CKD Stage 3 ([place]) 01/10/2015   IBS  01/10/2015   Hyperlipidemia, mixed 06/12/2014   Medication  management 06/12/2014   LBBB (left bundle branch block) 07/24/2013   HTN (hypertension) 07/24/2013   Major depression, recurrent, full remission (HCC)    OCD (obsessive compulsive disorder)    Abnormal glucose    Migraines    Vitamin D deficiency    Hypothyroidism 04/05/2013  Screening Tests Immunization History  Administered Date(s) Administered   Influenza Split 01/31/2016, 05/12/2018, 03/17/2019   Influenza, High Dose Seasonal PF 03/22/2017, 02/16/2018, 03/22/2019, 03/24/2021   Influenza-Unspecified 01/31/2016, 02/09/2020   PFIZER Comirnaty([place] Top)Covid-19 Tri-Sucrose Vaccine 03/31/2021   PFIZER(Purple Top)SARS-COV-2 Vaccination 07/05/2019, 07/26/2019, 03/04/2020   Pneumococcal Conjugate-13 03/22/2017, 06/29/2018   Pneumococcal Polysaccharide-23 02/12/1999, 06/29/2018   Td 04/27/2013   Zoster Recombinat (Shingrix) 06/14/2020, 08/28/2020   Zoster, Live 11/24/2005   Health Maintenance  Topic Date Due   DEXA SCAN  01/15/2021   COVID-19 Vaccine (5 - Booster for [place] series) 10/31/2021 (Originally 05/26/2021)   INFLUENZA VACCINE  11/25/2021   MAMMOGRAM  11/13/2022   TETANUS/TDAP  04/28/2023   COLONOSCOPY (Pts 45-110yr Insurance coverage will need to be confirmed)  11/14/2025   Pneumonia Vaccine 70 Years old  Completed   Hepatitis C Screening  Completed   Zoster Vaccines- Shingrix  Completed   HPV VACCINES  Aged Out   Preventative care: Last colonoscopy: 10/2020 Dr. SFuller Plan polyps, 5 year recall Last EGD: 02/2019  Last mammogram: annually at breast center, has upcoming scheduled 11/18/2021 Last breast MRI: 10/2018 Last pap smear/pelvic exam: 2018, s/p total hysterectomy, DONE  DEXA: 12/2018, R fem T -2.1 - has upcoming scheduled 11/18/2021  Former smoker, 40 pack year, quit 2004 Last CT chest 09/17/2020, emphysema, aortic atherosclerosis, no nodule, no further follow up recommended  Names of Other Physician/Practitioners you currently use: 1. [facility] here for primary care 2. Dr. GDelman Cheadle eye doctor, last visit 2021, encouraged to schedule 3. Dr. FCletus Gash dentist, last visit 07/2021, wants to find a new provider who takes insurance   Patient Care Team: MUnk [name] MD as PCP - General (Internal Medicine) NJosue Hector MD as PCP - Cardiology (Cardiology) CDonato Heinz MD as Consulting Physician (Nephrology) GMelissa Noon [place] Referring Physician (Optometry) SLadene Artist MD as Consulting Physician (Gastroenterology) NJosue Hector MD as Consulting Physician (Cardiology) ERush Landmark [facility] Pharmacist (Pharmacist) PLandry Mellow Candace (Gynecology)  SURGICAL HISTORY She  has a past surgical history that includes Tonsillectomy; Bunionectomy (Bilateral, 1997); Hemorrhoid surgery (1976); Breast enhancement surgery (Bilateral); ORIF ankle fracture (Left, 08/30/2017); Breast implant removal (Bilateral, 03/07/2019); Esophagogastroduodenoscopy (egd) with propofol (N/A, 03/12/2019); Colonoscopy (10/2020); Cataract extraction w/ intraocular lens implant (Right, 2005); and Inguinal hernia repair (N/A, 04/10/2021). FAMILY HISTORY Her family history includes Colon polyps in her mother; Heart block in her maternal grandmother; Hypertension in her sister; Muscular dystrophy in her maternal grandmother and mother. SOCIAL HISTORY She  reports that she quit smoking about 19 years ago. Her smoking use included cigarettes. She has a 40.00 pack-year smoking history. She has never used smokeless tobacco. She reports that she does not drink alcohol and does not use drugs.  MEDICARE WELLNESS OBJECTIVES: Physical activity: Current Exercise Habits: Home exercise routine, Type of exercise: Other - see comments, Time (Minutes): 60, Frequency (Times/Week): 3, Weekly Exercise (Minutes/Week): 180, Intensity: Mild, Exercise limited by: None identified Cardiac risk factors: Cardiac Risk Factors include: advanced age (>559m,  >6>19omen);dyslipidemia;hypertension;smoking/ tobacco exposure Depression/mood screen:      10/15/2021   12:04 PM  Depression screen PHQ 2/9  Decreased Interest 0  Down, Depressed, Hopeless 0  PHQ - 2 Score 0  Altered sleeping 0  Tired, decreased energy 1  Change in appetite 0  Feeling bad or failure about yourself  0  Trouble concentrating 0  Moving slowly or fidgety/restless 0  Suicidal thoughts 0  PHQ-9 Score 1  Difficult doing  work/chores Not difficult at all    ADLs:     10/15/2021   11:59 AM 04/10/2021    6:29 AM  In your present state of health, do you have any difficulty performing the following activities:  Hearing? 0 0  Vision? 0 0  Difficulty concentrating or making decisions? 0 0  Walking or climbing stairs? 0 0  Dressing or bathing? 0 0  Doing errands, shopping? 0      Cognitive Testing  Alert? Yes  Normal Appearance?Yes  Oriented to person? Yes  Place? Yes   Time? Yes  Recall of three objects?  Yes  Can perform simple calculations? Yes  Displays appropriate judgment?Yes  Can read the correct time from a watch face?Yes  EOL planning: Does Patient Have a Medical Advance Directive?: Yes Type of Advance Directive: Healthcare Power of Attorney, Living will Does patient want to make changes to medical advance directive?: No - Patient declined Copy of [place] in Chart?: Yes - validated most recent copy scanned in chart (See row information)     Review of Systems  Constitutional:  Negative for malaise/fatigue and weight loss.  HENT:  Negative for hearing loss and tinnitus.   Eyes:  Negative for blurred vision and double vision.  Respiratory:  Negative for cough, shortness of breath and wheezing.   Cardiovascular:  Negative for chest pain, palpitations, orthopnea, claudication and leg swelling.  Gastrointestinal:  Positive for diarrhea (improved, intermittent with trigger foods (lactose)). Negative for abdominal pain, blood in stool,  constipation, heartburn, melena, nausea and vomiting.  Genitourinary: Negative.   Musculoskeletal:  Negative for falls, joint pain and myalgias.  Skin:  Negative for rash.  Neurological:  Negative for dizziness, tingling, sensory change, weakness and headaches.  Endo/Heme/Allergies:  Negative for polydipsia.  Psychiatric/Behavioral:  Negative for depression, memory loss, substance abuse and suicidal ideas. The patient is not nervous/anxious and does not have insomnia.   All other systems reviewed and are negative.    Objective:     Today's Vitals   10/15/21 1140  BP: 96/60  Pulse: 71  Temp: 97.6 F (36.4 C)  SpO2: 98%  Weight: 169 lb (76.7 kg)   Body mass index is 29.94 kg/m.  General appearance: alert, no distress, WD/WN, female HEENT: normocephalic, sclerae anicteric, TMs pearly, nares patent, no discharge or erythema, pharynx normal Oral cavity: MMM, no lesions Neck: supple, no lymphadenopathy, no thyromegaly, no masses Heart: RRR, normal S1, S2, no murmurs Lungs: CTA bilaterally, no wheezes, rhonchi, or rales Abdomen: +bs, soft, non tender, non distended, no masses, no hepatomegaly, no splenomegaly, no hernias. Well healed lap incision sites.  Musculoskeletal: nontender, no swelling, no obvious deformity Extremities: no edema, no cyanosis, no clubbing.  Pulses: 2+ symmetric, upper and lower extremities, normal cap refill Neurological: alert, oriented x 3, CN2-12 intact, strength normal upper extremities and lower extremities, sensation normal throughout, DTRs 2+ throughout, no cerebellar signs, gait normal Psychiatric: normal affect, behavior normal, pleasant    Medicare Attestation I have personally reviewed: The patient's medical and social history Their use of alcohol, tobacco or illicit drugs Their current medications and supplements The patient's functional ability including ADLs,fall risks, home safety risks, cognitive, and hearing and visual impairment Diet and  physical activities Evidence for depression or mood disorders  The patient's weight, height, BMI, and visual acuity have been recorded in the chart.  I have made referrals, counseling, and provided education to the patient based on review of the above and I have provided  the patient with a written personalized care plan for preventive services.      Izora Ribas, NP   10/15/2021

## 2021-10-15 NOTE — Patient Instructions (Signed)
  Ms. Schoenfelder , Thank you for taking time to come for your Medicare Wellness Visit. I appreciate your ongoing commitment to your health goals. Please review the following plan we discussed and let me know if I can assist you in the future.   These are the goals we discussed:  Goals      Blood Pressure < 130/80     Exercise 150 min/wk Moderate Activity     LDL CALC < 100     Track and Manage Fluids and Swelling-Heart Failure     Timeframe:  Long-Range Goal Priority:  High Start Date:                             Expected End Date:                       Follow Up Date 04/29/2021    - track weight in diary - use salt in moderation - watch for swelling in feet, ankles and legs every day - weigh myself daily    Why is this important?   It is important to check your weight daily and watch how much salt and liquids you have.  It will help you to manage your heart failure.    Notes:         This is a list of the screening recommended for you and due dates:  Health Maintenance  Topic Date Due   DEXA scan (bone density measurement)  01/15/2021   COVID-19 Vaccine (5 - Booster for Pfizer series) 05/26/2021   Flu Shot  11/25/2021   Mammogram  11/13/2022   Tetanus Vaccine  04/28/2023   Colon Cancer Screening  11/14/2025   Pneumonia Vaccine  Completed   Hepatitis C Screening: USPSTF Recommendation to screen - Ages 18-79 yo.  Completed   Zoster (Shingles) Vaccine  Completed   HPV Vaccine  Aged Out

## 2021-10-16 ENCOUNTER — Encounter: Payer: Self-pay | Admitting: Obstetrics and Gynecology

## 2021-10-16 ENCOUNTER — Ambulatory Visit (INDEPENDENT_AMBULATORY_CARE_PROVIDER_SITE_OTHER): Payer: PPO | Admitting: Obstetrics and Gynecology

## 2021-10-16 VITALS — BP 102/69 | HR 75

## 2021-10-16 DIAGNOSIS — N816 Rectocele: Secondary | ICD-10-CM | POA: Diagnosis not present

## 2021-10-16 DIAGNOSIS — N393 Stress incontinence (female) (male): Secondary | ICD-10-CM | POA: Diagnosis not present

## 2021-10-16 DIAGNOSIS — N3941 Urge incontinence: Secondary | ICD-10-CM

## 2021-10-16 DIAGNOSIS — Z01818 Encounter for other preprocedural examination: Secondary | ICD-10-CM

## 2021-10-16 LAB — CBC WITH DIFFERENTIAL/PLATELET
Absolute Monocytes: 293 cells/uL (ref 200–950)
Basophils Absolute: 32 cells/uL (ref 0–200)
Basophils Relative: 0.7 %
Eosinophils Absolute: 536 cells/uL — ABNORMAL HIGH (ref 15–500)
Eosinophils Relative: 11.9 %
HCT: 41.9 % (ref 35.0–45.0)
Hemoglobin: 13.8 g/dL (ref 11.7–15.5)
Lymphs Abs: 1251 cells/uL (ref 850–3900)
MCH: 30.9 pg (ref 27.0–33.0)
MCHC: 32.9 g/dL (ref 32.0–36.0)
MCV: 93.7 fL (ref 80.0–100.0)
MPV: 10.6 fL (ref 7.5–12.5)
Monocytes Relative: 6.5 %
Neutro Abs: 2390 cells/uL (ref 1500–7800)
Neutrophils Relative %: 53.1 %
Platelets: 280 10*3/uL (ref 140–400)
RBC: 4.47 10*6/uL (ref 3.80–5.10)
RDW: 12.6 % (ref 11.0–15.0)
Total Lymphocyte: 27.8 %
WBC: 4.5 10*3/uL (ref 3.8–10.8)

## 2021-10-16 LAB — LIPID PANEL
Cholesterol: 204 mg/dL — ABNORMAL HIGH (ref <200)
HDL: 61 mg/dL (ref >=50)
LDL Cholesterol (Calc): 117 mg/dL (calc) — ABNORMAL HIGH
Non-HDL Cholesterol (Calc): 143 mg/dL (calc) — ABNORMAL HIGH (ref <130)
Total CHOL/HDL Ratio: 3.3 (calc) (ref <5.0)
Triglycerides: 145 mg/dL (ref <150)

## 2021-10-16 LAB — COMPLETE METABOLIC PANEL WITH GFR
AG Ratio: 1.3 (calc) (ref 1.0–2.5)
ALT: 9 U/L (ref 6–29)
AST: 16 U/L (ref 10–35)
Albumin: 3.8 g/dL (ref 3.6–5.1)
Alkaline phosphatase (APISO): 81 U/L (ref 37–153)
BUN/Creatinine Ratio: 24 (calc) — ABNORMAL HIGH (ref 6–22)
BUN: 26 mg/dL — ABNORMAL HIGH (ref 7–25)
CO2: 30 mmol/L (ref 20–32)
Calcium: 9.2 mg/dL (ref 8.6–10.4)
Chloride: 103 mmol/L (ref 98–110)
Creat: 1.07 mg/dL — ABNORMAL HIGH (ref 0.60–1.00)
Globulin: 2.9 g/dL (calc) (ref 1.9–3.7)
Glucose, Bld: 87 mg/dL (ref 65–99)
Potassium: 4.2 mmol/L (ref 3.5–5.3)
Sodium: 139 mmol/L (ref 135–146)
Total Bilirubin: 0.5 mg/dL (ref 0.2–1.2)
Total Protein: 6.7 g/dL (ref 6.1–8.1)
eGFR: 56 mL/min/{1.73_m2} — ABNORMAL LOW (ref >=60)

## 2021-10-16 LAB — TSH: TSH: 3.38 mIU/L (ref 0.40–4.50)

## 2021-10-16 LAB — MAGNESIUM: Magnesium: 2 mg/dL (ref 1.5–2.5)

## 2021-10-16 MED ORDER — TRAMADOL HCL 50 MG PO TABS: 50.0000 mg | ORAL_TABLET | Freq: Three times a day (TID) | ORAL | 0 refills | Status: AC | PRN

## 2021-10-16 MED ORDER — ACETAMINOPHEN 500 MG PO TABS: 500.0000 mg | ORAL_TABLET | Freq: Four times a day (QID) | ORAL | 0 refills | Status: DC | PRN

## 2021-10-16 NOTE — Progress Notes (Signed)
[facility] Pre-Operative H&P  Subjective Chief Complaint: [name] presents for a preoperative encounter.   History of Present Illness: [name] is a 71 y.o. female who presents for preoperative visit.  She is scheduled to undergo Exam under anesthesia, posterior repair, sacrospinous ligament fixation, midurethral sling, cystoscopy on 11/10/21.  Her symptoms include vaginal bulge and urinary leakage, and she was was found to have Stage 0 anterior, Stage I posterior, Stage I apical prolapse. . She had a positive CST on exam.   Past Medical History:  Diagnosis Date   ADHD (attention deficit hyperactivity disorder)    Chronic diastolic CHF (congestive heart failure) ([place])    cardriologist--- dr Viona Gilmore. Audie Box;   dx in setting sepsis 11/ 2020;   echo in epic 03-10-2019 ef 55-60%, G1DD, mild to moderate TR no stenosis and normal RASP;  CTA 01-31-2021 epic   Chronic kidney disease (CKD), stage III (moderate) ([place])    nephrologist--- dr Amedeo Gory   Depression    Diverticulosis of colon    Emphysema of lung ([place])    no home oxygen   GERD (gastroesophageal reflux disease)    Hiatal hernia    History of acute pyelonephritis    w/ hx sepsis   History of Helicobacter pylori infection 2012   History of recurrent UTIs    History of TIA (transient ischemic attack)    TIAs   Hyperlipidemia, mixed    Hypertension    followed by pcp and nephrologist----  (nuclear stress test in epic 06-28-2015 low risk no ischemia, nuclear ef 60%)   Hypothyroidism    IBS (irritable bowel syndrome)    IC (interstitial cystitis)    remote hx   Inguinal hernia, bilateral    Irritable bowel syndrome with constipation and diarrhea    LBBB (left bundle branch block) 2015   cardiologist --- dr Audie Box;  known chronic LBBB for several yrs   Migraines    OCD (obsessive compulsive disorder)    OSA (obstructive sleep apnea)    no CPAP intolerant   Osteopenia    Prolapsed hemorrhoids    Seasonal  allergies    Vitamin D deficiency      Past Surgical History:  Procedure Laterality Date   BREAST ENHANCEMENT SURGERY Bilateral    1970s   BREAST IMPLANT REMOVAL Bilateral 03/07/2019   BUNIONECTOMY Bilateral 1997   CATARACT EXTRACTION W/ INTRAOCULAR LENS IMPLANT Right 2005   COLONOSCOPY  10/2020   ESOPHAGOGASTRODUODENOSCOPY (EGD) WITH PROPOFOL N/A 03/12/2019   Procedure: ESOPHAGOGASTRODUODENOSCOPY (EGD) WITH PROPOFOL;  Surgeon: Carol Ada, MD;  Location: [facility];  Service: Endoscopy;  Laterality: N/A;   HEMORRHOID SURGERY  1976   INGUINAL HERNIA REPAIR N/A 04/10/2021   Procedure: LAPAROSCOPIC LEFT INGUINAL HERNIA REPAIR AND RIGHT INGUINAL HERNII REPAIR WITH TAP BLOCK;  Surgeon: Michael Boston, MD;  Location: [facility];  Service: General;  Laterality: N/A;   ORIF ANKLE FRACTURE Left 08/30/2017   Procedure: OPEN REDUCTION INTERNAL FIXATION (ORIF) ANKLE FRACTURE;  Surgeon: Rod Can, MD;  Location: [facility];  Service: Orthopedics;  Laterality: Left;   TONSILLECTOMY     CHILD    is allergic to ace inhibitors, cephalexin, dilaudid [hydromorphone hcl], robaxin [methocarbamol], macrobid [nitrofurantoin], levaquin [levofloxacin], neurontin [gabapentin], codeine, latex, and sulfa antibiotics.   Family History  Problem Relation Age of Onset   Colon polyps Mother    Muscular dystrophy Mother        late 35s   Hypertension Sister    Muscular dystrophy  Maternal Grandmother        50   Heart block Maternal Grandmother    Colon cancer Neg Hx    Esophageal cancer Neg Hx    Rectal cancer Neg Hx    Stomach cancer Neg Hx     Social History   Tobacco Use   Smoking status: Former    Packs/day: 1.00    Years: 40.00    Total pack years: 40.00    Types: Cigarettes    Quit date: 2004    Years since quitting: 19.4   Smokeless tobacco: Never  Vaping Use   Vaping Use: Never used  Substance Use Topics   Alcohol use: No   Drug use: Never     Review of Systems  was negative for a full 10 system review except as noted in the History of Present Illness.   Current Outpatient Medications:    acetaminophen (TYLENOL) 500 MG tablet, Take 500 mg by mouth every 6 (six) hours as needed., Disp: , Rfl:    albuterol (VENTOLIN HFA) 108 (90 Base) MCG/ACT inhaler, Inhale 1-2 puffs into the lungs every 4 (four) hours as needed for wheezing or shortness of breath. (Patient taking differently: Inhale 1-2 puffs into the lungs every 4 (four) hours as needed for wheezing or shortness of breath.), Disp: 18 g, Rfl: 1   ALPRAZolam (XANAX) 0.5 MG tablet, TAKE 1/2 TO 1 TABLET ONCE A DAY IF NEEDED FOR SEVERE ANXIETY OR SLEEP. **Use sparingly to prevent addiction and tolerance., Disp: 30 tablet, Rfl: 0   aspirin EC 81 MG tablet, Take 81 mg by mouth daily. , Disp: , Rfl:    aspirin-acetaminophen-caffeine (EXCEDRIN MIGRAINE) 250-250-65 MG tablet, Take 1 tablet by mouth every 6 (six) hours as needed for headache., Disp: , Rfl:    B Complex Vitamins (B COMPLEX PO), Take 1 capsule by mouth daily., Disp: , Rfl:    buPROPion (WELLBUTRIN XL) 150 MG 24 hr tablet, Take 1 tablet (150 mg total) by mouth daily., Disp: 90 tablet, Rfl: 3   calcium carbonate (TUMS - DOSED IN MG ELEMENTAL CALCIUM) 500 MG chewable tablet, Chew 2 tablets by mouth daily., Disp: , Rfl:    cetirizine (ZYRTEC) 10 MG tablet, Take 10 mg by mouth daily., Disp: , Rfl:    Cholecalciferol (VITAMIN D-3) 5000 UNITS TABS, Take 5,000 Units by mouth daily. , Disp: , Rfl:    Cinnamon 500 MG capsule, Take 500 mg by mouth daily., Disp: , Rfl:    diphenhydrAMINE (BENADRYL) 25 MG tablet, Take 25 mg by mouth every 6 (six) hours as needed., Disp: , Rfl:    estradiol (ESTRACE) 0.1 MG/GM vaginal cream, Place 0.5 g vaginally 2 (two) times a week. Place 0.5g nightly for two weeks then twice a week after, Disp: 30 g, Rfl: 11   famotidine (PEPCID) 20 MG tablet, TAKE 1 TABLET BY MOUTH TWICE DAILY FOR INDIGESTION AND HEARTBURN (Patient taking  differently: Take 40 mg by mouth 2 (two) times daily.), Disp: 180 tablet, Rfl: 3   guaifenesin (HUMIBID E) 400 MG TABS tablet, Take 400 mg by mouth daily., Disp: , Rfl:    hyoscyamine (LEVSIN SL) 0.125 MG SL tablet, DISSOLVE ONE TABLET UNDER THE TONGUE EVERY 4 HOURS IF NEEDED FOR NAUSEA, BLOATING, CRAMPING, OR DIARRHEA, Disp: 120 tablet, Rfl: 3   ipratropium-albuterol (DUONEB) 0.5-2.5 (3) MG/3ML SOLN, USE 1 VIAL IN NEBULIZER EVERY 4 HOURS AS NEEDED **MAX 6 DOSES A DAY**, Disp: 540 mL, Rfl: 0   levothyroxine (SYNTHROID) 125 MCG  tablet, Take 1 tablet by mouth on Monday, Wednesday and Friday and 1/2 tab all other days. Take on an empty stomach with only water for 30 minutes & no antacid calcium or magnesium for 4 hours & avoid biotin, Disp: 90 tablet, Rfl: 3   Magnesium 400 MG CAPS, Take 1 capsule by mouth daily., Disp: , Rfl:    ondansetron (ZOFRAN) 8 MG tablet, TAKE 1 TABLET BY MOUTH 3 TIMES A DAY AS NEEDED FOR NAUSEA (Patient taking differently: Take 8 mg by mouth every 8 (eight) hours as needed. TAKE 1 TABLET BY MOUTH 3 TIMES A DAY AS NEEDED FOR NAUSEA), Disp: 60 tablet, Rfl: 2   pantoprazole (PROTONIX) 20 MG tablet, Take 20 mg by mouth daily., Disp: , Rfl:    rosuvastatin (CRESTOR) 5 MG tablet, Start by taking 1 tab once a week, gradually increase to taking 3 nights a week., Disp: 36 tablet, Rfl: 0   sertraline (ZOLOFT) 100 MG tablet, Take 1 tablet by mouth daily for mood (Patient taking differently: Take 100 mg by mouth daily. Take 1 tablet by mouth daily for mood), Disp: 90 tablet, Rfl: 3   Turmeric 500 MG CAPS, Take 1 tablet by mouth daily., Disp: , Rfl:    vitamin C (ASCORBIC ACID) 500 MG tablet, Take 500 mg by mouth daily., Disp: , Rfl:    Objective Vitals:   10/16/21 1141  BP: 102/69  Pulse: 75    Gen: NAD CV: S1 S2 RRR Lungs: Clear to auscultation bilaterally Abd: soft, nontender   Previous Pelvic Exam showed: POP-Q (07/04/21):    POP-Q   -3                                             Aa   -3                                           Ba   -5.5                                              C    3                                            Gh   4                                            Pb   7                                            tvl    1  Ap   1                                            Bp                                                  D       Assessment/ Plan  Assessment: The patient is a 71 y.o. year old scheduled to undergo Exam under anesthesia, posterior repair, sacrospinous ligament fixation, midurethral sling, cystoscopy. Verbal consent was obtained for these procedures.  Plan: General Surgical Consent: The patient has previously been counseled on alternative treatments, and the decision by the patient and provider was to proceed with the procedure listed above.  For all procedures, there are risks of bleeding, infection, damage to surrounding organs including but not limited to bowel, bladder, blood vessels, ureters and nerves, and need for further surgery if an injury were to occur. These risks are all low with minimally invasive surgery.   There are risks of numbness and weakness at any body site or buttock/rectal pain.  It is possible that baseline pain can be worsened by surgery, either with or without mesh. If surgery is vaginal, there is also a low risk of possible conversion to laparoscopy or open abdominal incision where indicated. Very rare risks include blood transfusion, blood clot, heart attack, pneumonia, or death.   There is also a risk of short-term postoperative urinary retention with need to use a catheter. About half of patients need to go home from surgery with a catheter, which is then later removed in the office. The risk of long-term need for a catheter is very low. There is also a risk of worsening of overactive bladder.   Sling: The effectiveness of a midurethral  vaginal mesh sling is approximately 85%, and thus, there will be times when you may leak urine after surgery, especially if your bladder is full or if you have a strong cough. There is a balance between making the sling tight enough to treat your leakage but not too tight so that you have long-term difficulty emptying your bladder. A mesh sling will not directly treat overactive bladder/urge incontinence and may worsen it.  There is an FDA safety notification on vaginal mesh procedures for prolapse but NOT mesh slings. We have extensive experience and training with mesh placement and we have close postoperative follow up to identify any potential complications from mesh. It is important to realize that this mesh is a permanent implant that cannot be easily removed. There are rare risks of mesh exposure (2-4%), pain with intercourse (0-7%), and infection (<1%). The risk of mesh exposure if more likely in a woman with risks for poor healing (prior radiation, poorly controlled diabetes, or immunocompromised). The risk of new or worsened chronic pain after mesh implant is more common in women with baseline chronic pain and/or poorly controlled anxiety or depression. Approximately 2-4% of patients will experience longer-term post-operative voiding dysfunction that may require surgical revision of the sling. We also reviewed that postoperatively, her stream may not be as strong as before surgery.    Prolapse (with or without mesh): Risk factors for surgical failure  include things that put pressure on your pelvis and the  surgical repair, including obesity, chronic cough, and heavy lifting or straining (including lifting children or adults, straining on the toilet, or lifting heavy objects such as furniture or anything weighing >25 lbs. Risks of recurrence is 20-30% with vaginal native tissue repair and a less than 10% with sacrocolpopexy with mesh.     We discussed consent for blood products. Risks for blood  transfusion include allergic reactions, other reactions that can affect different body organs and managed accordingly, transmission of infectious diseases such as HIV or Hepatitis. However, the blood is screened. Patient consents for blood products.  Pre-operative instructions:  She was instructed to not take Aspirin/NSAIDs x 7days prior to surgery. Antibiotic prophylaxis was ordered as indicated.  Cathter use: Patient will go home with foley if needed after post-operative voiding trial.  Post-operative instructions:  She was provided with specific post-operative instructions, including precautions and signs/symptoms for which we would recommend contacting us, in addition to daytime and after-hours contact phone numbers. This was provided on a handout.   Post-operative medications: Prescriptions for  tylenol, and tramadol were sent to her pharmacy. Discussed using \ tylenol on a schedule to limit use of narcotics. Cannot take ibuprofen. Has miralax at home.   Laboratory testing:  We will check labs: type and screen.    Preoperative clearance:  She does not require surgical clearance.    Post-operative follow-up:  A post-operative appointment will be made for 6 weeks from the date of surgery. If she needs a post-operative nurse visit for a voiding trial, that will be set up after she leaves the hospital.    Patient will call the clinic or use MyChart should anything change or any new issues arise.   Jaquita Folds, MD   Time spent: I spent 25 minutes dedicated to the care of this patient on the date of this encounter to include pre-visit review of records, face-to-face time with the patient and post visit documentation and ordering medication/ testing.

## 2021-10-16 NOTE — H&P (Signed)
[facility] Pre-Operative H&P  Subjective Chief Complaint: [name] presents for a preoperative encounter.   History of Present Illness: [name] is a 71 y.o. female who presents for preoperative visit.  She is scheduled to undergo Exam under anesthesia, posterior repair, sacrospinous ligament fixation, midurethral sling, cystoscopy on 11/10/21.  Her symptoms include vaginal bulge and urinary leakage, and she was was found to have Stage 0 anterior, Stage I posterior, Stage I apical prolapse. . She had a positive CST on exam.   Past Medical History:  Diagnosis Date   ADHD (attention deficit hyperactivity disorder)    Chronic diastolic CHF (congestive heart failure) ([place])    cardriologist--- dr Viona Gilmore. Audie Box;   dx in setting sepsis 11/ 2020;   echo in epic 03-10-2019 ef 55-60%, G1DD, mild to moderate TR no stenosis and normal RASP;  CTA 01-31-2021 epic   Chronic kidney disease (CKD), stage III (moderate) ([place])    nephrologist--- dr Amedeo Gory   Depression    Diverticulosis of colon    Emphysema of lung ([place])    no home oxygen   GERD (gastroesophageal reflux disease)    Hiatal hernia    History of acute pyelonephritis    w/ hx sepsis   History of Helicobacter pylori infection 2012   History of recurrent UTIs    History of TIA (transient ischemic attack)    TIAs   Hyperlipidemia, mixed    Hypertension    followed by pcp and nephrologist----  (nuclear stress test in epic 06-28-2015 low risk no ischemia, nuclear ef 60%)   Hypothyroidism    IBS (irritable bowel syndrome)    IC (interstitial cystitis)    remote hx   Inguinal hernia, bilateral    Irritable bowel syndrome with constipation and diarrhea    LBBB (left bundle branch block) 2015   cardiologist --- dr Audie Box;  known chronic LBBB for several yrs   Migraines    OCD (obsessive compulsive disorder)    OSA (obstructive sleep apnea)    no CPAP intolerant   Osteopenia    Prolapsed hemorrhoids    Seasonal  allergies    Vitamin D deficiency      Past Surgical History:  Procedure Laterality Date   BREAST ENHANCEMENT SURGERY Bilateral    1970s   BREAST IMPLANT REMOVAL Bilateral 03/07/2019   BUNIONECTOMY Bilateral 1997   CATARACT EXTRACTION W/ INTRAOCULAR LENS IMPLANT Right 2005   COLONOSCOPY  10/2020   ESOPHAGOGASTRODUODENOSCOPY (EGD) WITH PROPOFOL N/A 03/12/2019   Procedure: ESOPHAGOGASTRODUODENOSCOPY (EGD) WITH PROPOFOL;  Surgeon: Carol Ada, MD;  Location: [facility];  Service: Endoscopy;  Laterality: N/A;   HEMORRHOID SURGERY  1976   INGUINAL HERNIA REPAIR N/A 04/10/2021   Procedure: LAPAROSCOPIC LEFT INGUINAL HERNIA REPAIR AND RIGHT INGUINAL HERNII REPAIR WITH TAP BLOCK;  Surgeon: Michael Boston, MD;  Location: [facility];  Service: General;  Laterality: N/A;   ORIF ANKLE FRACTURE Left 08/30/2017   Procedure: OPEN REDUCTION INTERNAL FIXATION (ORIF) ANKLE FRACTURE;  Surgeon: Rod Can, MD;  Location: [facility];  Service: Orthopedics;  Laterality: Left;   TONSILLECTOMY     CHILD    is allergic to ace inhibitors, cephalexin, dilaudid [hydromorphone hcl], robaxin [methocarbamol], macrobid [nitrofurantoin], levaquin [levofloxacin], neurontin [gabapentin], codeine, latex, and sulfa antibiotics.   Family History  Problem Relation Age of Onset   Colon polyps Mother    Muscular dystrophy Mother        late 29s   Hypertension Sister    Muscular dystrophy  Maternal Grandmother        50   Heart block Maternal Grandmother    Colon cancer Neg Hx    Esophageal cancer Neg Hx    Rectal cancer Neg Hx    Stomach cancer Neg Hx     Social History   Tobacco Use   Smoking status: Former    Packs/day: 1.00    Years: 40.00    Total pack years: 40.00    Types: Cigarettes    Quit date: 2004    Years since quitting: 19.4   Smokeless tobacco: Never  Vaping Use   Vaping Use: Never used  Substance Use Topics   Alcohol use: No   Drug use: Never     Review of Systems  was negative for a full 10 system review except as noted in the History of Present Illness.  No current facility-administered medications for this encounter.  Current Outpatient Medications:    acetaminophen (TYLENOL) 500 MG tablet, Take 500 mg by mouth every 6 (six) hours as needed., Disp: , Rfl:    acetaminophen (TYLENOL) 500 MG tablet, Take 1 tablet (500 mg total) by mouth every 6 (six) hours as needed (pain)., Disp: 30 tablet, Rfl: 0   ALPRAZolam (XANAX) 0.5 MG tablet, TAKE 1/2 TO 1 TABLET ONCE A DAY IF NEEDED FOR SEVERE ANXIETY OR SLEEP. **Use sparingly to prevent addiction and tolerance., Disp: 30 tablet, Rfl: 0   aspirin EC 81 MG tablet, Take 81 mg by mouth daily. , Disp: , Rfl:    aspirin-acetaminophen-caffeine (EXCEDRIN MIGRAINE) 250-250-65 MG tablet, Take 1 tablet by mouth every 6 (six) hours as needed for headache., Disp: , Rfl:    B Complex Vitamins (B COMPLEX PO), Take 1 capsule by mouth daily., Disp: , Rfl:    buPROPion (WELLBUTRIN XL) 150 MG 24 hr tablet, Take 1 tablet (150 mg total) by mouth daily., Disp: 90 tablet, Rfl: 3   calcium carbonate (TUMS - DOSED IN MG ELEMENTAL CALCIUM) 500 MG chewable tablet, Chew 2 tablets by mouth daily., Disp: , Rfl:    cetirizine (ZYRTEC) 10 MG tablet, Take 10 mg by mouth daily., Disp: , Rfl:    Cholecalciferol (VITAMIN D-3) 5000 UNITS TABS, Take 5,000 Units by mouth daily. , Disp: , Rfl:    Cinnamon 500 MG capsule, Take 500 mg by mouth daily., Disp: , Rfl:    diphenhydrAMINE (BENADRYL) 25 MG tablet, Take 25 mg by mouth every 6 (six) hours as needed., Disp: , Rfl:    estradiol (ESTRACE) 0.1 MG/GM vaginal cream, Place 0.5 g vaginally 2 (two) times a week. Place 0.5g nightly for two weeks then twice a week after, Disp: 30 g, Rfl: 11   famotidine (PEPCID) 20 MG tablet, TAKE 1 TABLET BY MOUTH TWICE DAILY FOR INDIGESTION AND HEARTBURN (Patient taking differently: Take 40 mg by mouth 2 (two) times daily.), Disp: 180 tablet, Rfl: 3   guaifenesin (HUMIBID  E) 400 MG TABS tablet, Take 400 mg by mouth daily., Disp: , Rfl:    hyoscyamine (LEVSIN SL) 0.125 MG SL tablet, DISSOLVE ONE TABLET UNDER THE TONGUE EVERY 4 HOURS IF NEEDED FOR NAUSEA, BLOATING, CRAMPING, OR DIARRHEA, Disp: 120 tablet, Rfl: 3   ipratropium-albuterol (DUONEB) 0.5-2.5 (3) MG/3ML SOLN, USE 1 VIAL IN NEBULIZER EVERY 4 HOURS AS NEEDED **MAX 6 DOSES A DAY**, Disp: 540 mL, Rfl: 0   levothyroxine (SYNTHROID) 125 MCG tablet, Take 1 tablet by mouth on Monday, Wednesday and Friday and 1/2 tab all other days. Take on an  empty stomach with only water for 30 minutes & no antacid calcium or magnesium for 4 hours & avoid biotin, Disp: 90 tablet, Rfl: 3   Magnesium 400 MG CAPS, Take 1 capsule by mouth daily., Disp: , Rfl:    ondansetron (ZOFRAN) 8 MG tablet, TAKE 1 TABLET BY MOUTH 3 TIMES A DAY AS NEEDED FOR NAUSEA (Patient taking differently: Take 8 mg by mouth every 8 (eight) hours as needed. TAKE 1 TABLET BY MOUTH 3 TIMES A DAY AS NEEDED FOR NAUSEA), Disp: 60 tablet, Rfl: 2   pantoprazole (PROTONIX) 20 MG tablet, Take 20 mg by mouth daily., Disp: , Rfl:    rosuvastatin (CRESTOR) 5 MG tablet, Start by taking 1 tab once a week, gradually increase to taking 3 nights a week., Disp: 36 tablet, Rfl: 0   sertraline (ZOLOFT) 100 MG tablet, Take 1 tablet by mouth daily for mood (Patient taking differently: Take 100 mg by mouth daily. Take 1 tablet by mouth daily for mood), Disp: 90 tablet, Rfl: 3   traMADol (ULTRAM) 50 MG tablet, Take 1 tablet (50 mg total) by mouth every 8 (eight) hours as needed for up to 5 days., Disp: 10 tablet, Rfl: 0   Turmeric 500 MG CAPS, Take 1 tablet by mouth daily., Disp: , Rfl:    vitamin C (ASCORBIC ACID) 500 MG tablet, Take 500 mg by mouth daily., Disp: , Rfl:    Objective There were no vitals filed for this visit.   Gen: NAD CV: S1 S2 RRR Lungs: Clear to auscultation bilaterally Abd: soft, nontender   Previous Pelvic Exam showed: POP-Q (07/04/21):    POP-Q   -3                                             Aa   -3                                           Ba   -5.5                                              C    3                                            Gh   4                                            Pb   7                                            tvl    1  Ap   1                                            Bp                                                  D       Assessment/ Plan  The patient is a 71 y.o. year old scheduled to undergo Exam under anesthesia, posterior repair, sacrospinous ligament fixation, midurethral sling, cystoscopy.   Jaquita Folds, MD

## 2021-10-24 ENCOUNTER — Telehealth: Payer: Self-pay

## 2021-10-24 DIAGNOSIS — N1831 Chronic kidney disease, stage 3a: Secondary | ICD-10-CM | POA: Diagnosis not present

## 2021-10-24 DIAGNOSIS — I10 Essential (primary) hypertension: Secondary | ICD-10-CM | POA: Diagnosis not present

## 2021-10-24 DIAGNOSIS — E663 Overweight: Secondary | ICD-10-CM | POA: Diagnosis not present

## 2021-10-24 DIAGNOSIS — E782 Mixed hyperlipidemia: Secondary | ICD-10-CM | POA: Diagnosis not present

## 2021-10-24 NOTE — Telephone Encounter (Signed)
June RPM readings  10/24/2021 Friday at 08:17 AM 166.2      10/23/2021 Thursday at 11:10 AM 165.8      10/22/2021 Wednesday at 10:10 AM 167.6      10/21/2021 Tuesday at 07:30 PM 34.2      10/21/2021 Tuesday at 03:02 PM 167.3      10/20/2021 Monday at 02:57 PM 166      10/20/2021 Monday at 02:56 PM 166      10/18/2021 Saturday at 11:26 PM 167.1      10/17/2021 Friday at 12:55 PM 165.3      10/16/2021 Thursday at 09:44 PM 166.7      10/15/2021 Wednesday at 09:22 AM 168      10/14/2021 Tuesday at 02:05 PM 168.4      10/14/2021 Tuesday at 01:24 PM 169.5      10/13/2021 Monday at 09:32 AM 167.6      10/12/2021 'Sunday at 11:23 AM 165.6      10/11/2021 Saturday at 08:17 AM 165.8      10/10/2021 Friday at 07:53 AM 165.6      10/09/2021 Thursday at 10:43 AM 165.1      10/08/2021 Wednesday at 09:47 AM 164.7      10/07/2021 Tuesday at 10:21 AM 164.9      10/06/2021 Monday at 10:39 AM 165.8      10/05/2021 Sunday at 09:52 AM 167.1      10/04/2021 Saturday at 09:21 AM 166.7      10/03/2021 Friday at 08:58 AM 168.2      10/02/2021 Thursday at 09:36 AM 168.4      10/01/2021 Wednesday at 08:36 AM 165.6      09/30/2021 Tuesday at 07:00 AM 165.1      09/29/2021 Monday at 09:18 AM 166.9      09/28/2021 Sunday at 09:42 AM 168.4      09/27/2021 Saturday at 06:57 AM 168.7      09/26/2021 Friday at 10:08 AM 167.1      06'$ /04/2021 Thursday at 08:31 AM 165.6

## 2021-10-29 ENCOUNTER — Encounter (HOSPITAL_BASED_OUTPATIENT_CLINIC_OR_DEPARTMENT_OTHER): Payer: Self-pay | Admitting: Obstetrics and Gynecology

## 2021-10-29 ENCOUNTER — Encounter: Payer: Self-pay | Admitting: Obstetrics and Gynecology

## 2021-10-30 ENCOUNTER — Other Ambulatory Visit (HOSPITAL_COMMUNITY)
Admission: RE | Admit: 2021-10-30 | Discharge: 2021-10-30 | Disposition: A | Discharge status: Home / Self Care | Payer: PPO | Source: Ambulatory Visit | Attending: Obstetrics and Gynecology | Admitting: Obstetrics and Gynecology

## 2021-10-30 ENCOUNTER — Ambulatory Visit (INDEPENDENT_AMBULATORY_CARE_PROVIDER_SITE_OTHER): Payer: PPO

## 2021-10-30 ENCOUNTER — Encounter (HOSPITAL_BASED_OUTPATIENT_CLINIC_OR_DEPARTMENT_OTHER): Payer: Self-pay | Admitting: Obstetrics & Gynecology

## 2021-10-30 DIAGNOSIS — R82998 Other abnormal findings in urine: Secondary | ICD-10-CM | POA: Insufficient documentation

## 2021-10-30 DIAGNOSIS — R350 Frequency of micturition: Secondary | ICD-10-CM | POA: Diagnosis not present

## 2021-10-30 LAB — POCT URINALYSIS DIPSTICK
Appearance: ABNORMAL
Bilirubin, UA: NEGATIVE
Glucose, UA: NEGATIVE
Ketones, UA: NEGATIVE
Nitrite, UA: NEGATIVE
Protein, UA: POSITIVE — AB
Spec Grav, UA: 1.025 (ref 1.010–1.025)
Urobilinogen, UA: 0.2 E.U./dL
pH, UA: 6.5 (ref 5.0–8.0)

## 2021-10-30 MED ORDER — PHENAZOPYRIDINE HCL 200 MG PO TABS: 200.0000 mg | ORAL_TABLET | Freq: Three times a day (TID) | ORAL | 0 refills | Status: DC | PRN

## 2021-10-30 NOTE — Patient Instructions (Signed)
Your Urine dip that was done in office was Positive. I am sending the urine off for culture. We have ordered Pyridium '200mg'$  to take three times a day for urinary discomfort.  We will contact you when the results are back between 3-5 days. If a different antibiotic is needed we will sent the order to the pharmacy and you will be notified. If you have any questions or concerns please feel free to call us at [phone]

## 2021-10-30 NOTE — Telephone Encounter (Signed)
Pt was contacted and is coming in to leave a urine sample.

## 2021-10-30 NOTE — Progress Notes (Signed)
[name] is a 71 y.o. female arrived today with UTI sx.  A urine specimen was collected and POCT Urine was done and urine culture sent to the lab. POCT Urine was positive for large leukocytes and Trace blood.  Per Dr Tommas Olp protocol Bactrum DS  800-'160mg'$  would be recommended but pt has a Sulfa allergy. The covering physician will be notified of the next recommendation.  Pyridium '200mg'$  TID for 2 days sent to the pharmacy  Pt will be contacted when the culture is back. ---  Spoke to the patient and she is insistent that she is not allergic to bactrim. Bactrim DS BID for 3 days called in to the pharmacy per patients request. Order was not entered because Epic was down.

## 2021-10-31 LAB — URINE CULTURE: Culture: <10000 — AB

## 2021-11-03 NOTE — Progress Notes (Signed)
LM on VM for the patient to call back

## 2021-11-03 NOTE — Progress Notes (Signed)
LM on the VM for the pt to call me back

## 2021-11-04 NOTE — Progress Notes (Signed)
Pt was contacted of the message. Pt said she believes it her IC.

## 2021-11-09 MED ORDER — CEFAZOLIN SODIUM-DEXTROSE 2-4 GM/100ML-% IV SOLN
INTRAVENOUS | Status: AC
  Filled 2021-11-09: qty 100

## 2021-11-10 ENCOUNTER — Telehealth: Payer: Self-pay | Admitting: Obstetrics and Gynecology

## 2021-11-10 ENCOUNTER — Other Ambulatory Visit: Payer: Self-pay

## 2021-11-10 ENCOUNTER — Encounter (HOSPITAL_BASED_OUTPATIENT_CLINIC_OR_DEPARTMENT_OTHER)
Admission: RE | Disposition: A | Discharge status: Home / Self Care | Payer: Self-pay | Source: Ambulatory Visit | Attending: Obstetrics and Gynecology

## 2021-11-10 ENCOUNTER — Encounter (HOSPITAL_BASED_OUTPATIENT_CLINIC_OR_DEPARTMENT_OTHER): Payer: Self-pay | Admitting: Obstetrics and Gynecology

## 2021-11-10 ENCOUNTER — Ambulatory Visit (HOSPITAL_BASED_OUTPATIENT_CLINIC_OR_DEPARTMENT_OTHER)
Admission: RE | Admit: 2021-11-10 | Discharge: 2021-11-10 | Disposition: A | Discharge status: Home / Self Care | Payer: PPO | Source: Ambulatory Visit | Attending: Obstetrics and Gynecology | Admitting: Obstetrics and Gynecology

## 2021-11-10 ENCOUNTER — Ambulatory Visit (HOSPITAL_BASED_OUTPATIENT_CLINIC_OR_DEPARTMENT_OTHER): Payer: PPO | Admitting: Anesthesiology

## 2021-11-10 DIAGNOSIS — K219 Gastro-esophageal reflux disease without esophagitis: Secondary | ICD-10-CM | POA: Insufficient documentation

## 2021-11-10 DIAGNOSIS — Z8673 Personal history of transient ischemic attack (TIA), and cerebral infarction without residual deficits: Secondary | ICD-10-CM | POA: Diagnosis not present

## 2021-11-10 DIAGNOSIS — J449 Chronic obstructive pulmonary disease, unspecified: Secondary | ICD-10-CM | POA: Diagnosis not present

## 2021-11-10 DIAGNOSIS — N393 Stress incontinence (female) (male): Secondary | ICD-10-CM | POA: Insufficient documentation

## 2021-11-10 DIAGNOSIS — N993 Prolapse of vaginal vault after hysterectomy: Secondary | ICD-10-CM | POA: Diagnosis not present

## 2021-11-10 DIAGNOSIS — I5032 Chronic diastolic (congestive) heart failure: Secondary | ICD-10-CM

## 2021-11-10 DIAGNOSIS — I110 Hypertensive heart disease with heart failure: Secondary | ICD-10-CM | POA: Diagnosis not present

## 2021-11-10 DIAGNOSIS — I130 Hypertensive heart and chronic kidney disease with heart failure and stage 1 through stage 4 chronic kidney disease, or unspecified chronic kidney disease: Secondary | ICD-10-CM | POA: Diagnosis not present

## 2021-11-10 DIAGNOSIS — Z90710 Acquired absence of both cervix and uterus: Secondary | ICD-10-CM | POA: Diagnosis not present

## 2021-11-10 DIAGNOSIS — Z87891 Personal history of nicotine dependence: Secondary | ICD-10-CM

## 2021-11-10 DIAGNOSIS — Z01818 Encounter for other preprocedural examination: Secondary | ICD-10-CM

## 2021-11-10 DIAGNOSIS — N183 Chronic kidney disease, stage 3 unspecified: Secondary | ICD-10-CM | POA: Diagnosis not present

## 2021-11-10 DIAGNOSIS — I2510 Atherosclerotic heart disease of native coronary artery without angina pectoris: Secondary | ICD-10-CM

## 2021-11-10 DIAGNOSIS — E039 Hypothyroidism, unspecified: Secondary | ICD-10-CM | POA: Diagnosis not present

## 2021-11-10 DIAGNOSIS — N816 Rectocele: Secondary | ICD-10-CM

## 2021-11-10 DIAGNOSIS — G4730 Sleep apnea, unspecified: Secondary | ICD-10-CM | POA: Diagnosis not present

## 2021-11-10 HISTORY — PX: BLADDER SUSPENSION: SHX72

## 2021-11-10 HISTORY — PX: CYSTOSCOPY WITH FULGERATION: SHX6638

## 2021-11-10 HISTORY — PX: ANTERIOR AND POSTERIOR REPAIR WITH SACROSPINOUS FIXATION: SHX6536

## 2021-11-10 LAB — TYPE AND SCREEN
ABO/RH(D): A NEG
Antibody Screen: NEGATIVE

## 2021-11-10 LAB — ABO/RH: ABO/RH(D): A NEG

## 2021-11-10 SURGERY — ANTERIOR AND POSTERIOR REPAIR WITH SACROSPINOUS FIXATION
Anesthesia: General | Site: Vagina

## 2021-11-10 MED ORDER — FENTANYL CITRATE (PF) 100 MCG/2ML IJ SOLN: 25.0000 ug | INTRAMUSCULAR | Status: DC | PRN

## 2021-11-10 MED ORDER — VANCOMYCIN HCL IN DEXTROSE 1-5 GM/200ML-% IV SOLN
INTRAVENOUS | Status: AC
  Filled 2021-11-10: qty 200

## 2021-11-10 MED ORDER — GLYCOPYRROLATE 0.2 MG/ML IJ SOLN
INTRAMUSCULAR | Status: DC | PRN
  Administered 2021-11-10: .2 mg via INTRAVENOUS

## 2021-11-10 MED ORDER — LIDOCAINE 2% (20 MG/ML) 5 ML SYRINGE
INTRAMUSCULAR | Status: DC | PRN
  Administered 2021-11-10: 80 mg via INTRAVENOUS

## 2021-11-10 MED ORDER — DEXAMETHASONE SODIUM PHOSPHATE 10 MG/ML IJ SOLN
INTRAMUSCULAR | Status: DC | PRN
  Administered 2021-11-10: 4 mg via INTRAVENOUS

## 2021-11-10 MED ORDER — POVIDONE-IODINE 10 % EX SWAB: 2.0000 | Freq: Once | CUTANEOUS | Status: DC

## 2021-11-10 MED ORDER — FENTANYL CITRATE (PF) 250 MCG/5ML IJ SOLN
INTRAMUSCULAR | Status: DC | PRN
Start: 2021-11-10 — End: 2021-11-10
  Administered 2021-11-10 (×2): 25 ug via INTRAVENOUS
  Administered 2021-11-10: 50 ug via INTRAVENOUS

## 2021-11-10 MED ORDER — SODIUM CHLORIDE 0.9 % IV SOLN
INTRAVENOUS | Status: DC
  Administered 2021-11-10: 1000 mL via INTRAVENOUS

## 2021-11-10 MED ORDER — PHENAZOPYRIDINE HCL 100 MG PO TABS
ORAL_TABLET | ORAL | Status: AC
  Filled 2021-11-10: qty 2

## 2021-11-10 MED ORDER — HEMOSTATIC AGENTS (NO CHARGE) OPTIME
TOPICAL | Status: DC | PRN
  Administered 2021-11-10: 1

## 2021-11-10 MED ORDER — ACETAMINOPHEN 500 MG PO TABS
ORAL_TABLET | ORAL | Status: AC
  Filled 2021-11-10: qty 2

## 2021-11-10 MED ORDER — PROPOFOL 10 MG/ML IV BOLUS
INTRAVENOUS | Status: AC
  Filled 2021-11-10: qty 20

## 2021-11-10 MED ORDER — STERILE WATER FOR IRRIGATION IR SOLN
Status: DC | PRN
  Administered 2021-11-10: 300 mL via INTRAVESICAL

## 2021-11-10 MED ORDER — PHENYLEPHRINE HCL (PRESSORS) 10 MG/ML IV SOLN
INTRAVENOUS | Status: AC
  Filled 2021-11-10: qty 1

## 2021-11-10 MED ORDER — EPHEDRINE SULFATE (PRESSORS) 50 MG/ML IJ SOLN
INTRAMUSCULAR | Status: DC | PRN
  Administered 2021-11-10: 5 mg via INTRAVENOUS

## 2021-11-10 MED ORDER — FENTANYL CITRATE (PF) 100 MCG/2ML IJ SOLN
INTRAMUSCULAR | Status: AC
  Filled 2021-11-10: qty 2

## 2021-11-10 MED ORDER — PHENYLEPHRINE HCL (PRESSORS) 10 MG/ML IV SOLN
INTRAVENOUS | Status: DC | PRN
  Administered 2021-11-10: 100 ug via INTRAVENOUS
  Administered 2021-11-10: 40 ug via INTRAVENOUS
  Administered 2021-11-10: 100 ug via INTRAVENOUS

## 2021-11-10 MED ORDER — 0.9 % SODIUM CHLORIDE (POUR BTL) OPTIME
TOPICAL | Status: DC | PRN
  Administered 2021-11-10: 500 mL

## 2021-11-10 MED ORDER — VANCOMYCIN HCL IN DEXTROSE 1-5 GM/200ML-% IV SOLN
1000.0000 mg | Freq: Once | INTRAVENOUS | Status: AC
  Administered 2021-11-10: 1000 mg via INTRAVENOUS

## 2021-11-10 MED ORDER — ONDANSETRON HCL 4 MG/2ML IJ SOLN: 4.0000 mg | Freq: Once | INTRAMUSCULAR | Status: DC | PRN

## 2021-11-10 MED ORDER — DEXMEDETOMIDINE (PRECEDEX) IN NS 20 MCG/5ML (4 MCG/ML) IV SYRINGE
PREFILLED_SYRINGE | INTRAVENOUS | Status: DC | PRN
  Administered 2021-11-10: 4 ug via INTRAVENOUS
  Administered 2021-11-10: 8 ug via INTRAVENOUS

## 2021-11-10 MED ORDER — LIDOCAINE-EPINEPHRINE 1 %-1:100000 IJ SOLN
INTRAMUSCULAR | Status: DC | PRN
  Administered 2021-11-10: 16 mL

## 2021-11-10 MED ORDER — ACETAMINOPHEN 500 MG PO TABS
1000.0000 mg | ORAL_TABLET | ORAL | Status: AC
  Administered 2021-11-10: 1000 mg via ORAL

## 2021-11-10 MED ORDER — ARTIFICIAL TEARS OPHTHALMIC OINT
TOPICAL_OINTMENT | OPHTHALMIC | Status: AC
  Filled 2021-11-10: qty 3.5

## 2021-11-10 MED ORDER — GLYCOPYRROLATE PF 0.2 MG/ML IJ SOSY
PREFILLED_SYRINGE | INTRAMUSCULAR | Status: AC
  Filled 2021-11-10: qty 1

## 2021-11-10 MED ORDER — DEXTROSE 5 % IV SOLN
5.0000 mg/kg | INTRAVENOUS | Status: AC
Start: 2021-11-10 — End: 2021-11-10
  Administered 2021-11-10: 380 mg via INTRAVENOUS
  Filled 2021-11-10: qty 9.5

## 2021-11-10 MED ORDER — ONDANSETRON HCL 4 MG/2ML IJ SOLN
INTRAMUSCULAR | Status: DC | PRN
  Administered 2021-11-10: 4 mg via INTRAVENOUS

## 2021-11-10 MED ORDER — MIDAZOLAM HCL 2 MG/2ML IJ SOLN
INTRAMUSCULAR | Status: AC
  Filled 2021-11-10: qty 2

## 2021-11-10 MED ORDER — MIDAZOLAM HCL 2 MG/2ML IJ SOLN
INTRAMUSCULAR | Status: DC | PRN
  Administered 2021-11-10: 1 mg via INTRAVENOUS
  Administered 2021-11-10: .5 mg via INTRAVENOUS

## 2021-11-10 MED ORDER — SODIUM CHLORIDE 0.9 % IR SOLN
Status: DC | PRN
  Administered 2021-11-10: 1000 mL via INTRAVESICAL
  Administered 2021-11-10: 500 mL via INTRAVESICAL

## 2021-11-10 MED ORDER — PHENAZOPYRIDINE HCL 100 MG PO TABS
200.0000 mg | ORAL_TABLET | ORAL | Status: AC
  Administered 2021-11-10: 200 mg via ORAL

## 2021-11-10 MED ORDER — PROPOFOL 10 MG/ML IV BOLUS
INTRAVENOUS | Status: DC | PRN
  Administered 2021-11-10: 10 mg via INTRAVENOUS
  Administered 2021-11-10: 20 mg via INTRAVENOUS
  Administered 2021-11-10: 140 mg via INTRAVENOUS

## 2021-11-10 MED ORDER — PHENYLEPHRINE 80 MCG/ML (10ML) SYRINGE FOR IV PUSH (FOR BLOOD PRESSURE SUPPORT)
PREFILLED_SYRINGE | INTRAVENOUS | Status: AC
  Filled 2021-11-10: qty 10

## 2021-11-10 MED ORDER — PHENYLEPHRINE HCL-NACL 20-0.9 MG/250ML-% IV SOLN
INTRAVENOUS | Status: DC | PRN
  Administered 2021-11-10: 40 ug/min via INTRAVENOUS

## 2021-11-10 SURGICAL SUPPLY — 52 items
ADH SKN CLS APL DERMABOND .7 (GAUZE/BANDAGES/DRESSINGS) ×3
AGENT HMST KT MTR STRL THRMB (HEMOSTASIS) ×3
APL ESCP 34 STRL LF DISP (HEMOSTASIS)
APPLICATOR SURGIFLO ENDO (HEMOSTASIS) IMPLANT
BLADE CLIPPER SENSICLIP SURGIC (BLADE) ×2 IMPLANT
BLADE SURG 15 STRL LF DISP TIS (BLADE) ×3 IMPLANT
BLADE SURG 15 STRL SS (BLADE) ×4
DECANTER SPIKE VIAL GLASS SM (MISCELLANEOUS) ×3 IMPLANT
DERMABOND ADVANCED (GAUZE/BANDAGES/DRESSINGS) ×1
DERMABOND ADVANCED .7 DNX12 (GAUZE/BANDAGES/DRESSINGS) ×3 IMPLANT
DEVICE CAPIO SLIM SINGLE (INSTRUMENTS) ×2 IMPLANT
ELECT REM PT RETURN 9FT ADLT (ELECTROSURGICAL) ×4
ELECTRODE REM PT RTRN 9FT ADLT (ELECTROSURGICAL) ×1 IMPLANT
GAUZE 4X4 16PLY ~~LOC~~+RFID DBL (SPONGE) ×4 IMPLANT
GLOVE BIO SURGEON STRL SZ 6 (GLOVE) ×4 IMPLANT
GLOVE BIOGEL PI IND STRL 6.5 (GLOVE) ×3 IMPLANT
GLOVE BIOGEL PI IND STRL 7.0 (GLOVE) ×3 IMPLANT
GLOVE BIOGEL PI INDICATOR 6.5 (GLOVE) ×1
GLOVE BIOGEL PI INDICATOR 7.0 (GLOVE) ×1
GLOVE ECLIPSE 6.0 STRL STRAW (GLOVE) ×4 IMPLANT
GOWN STRL REUS W/TWL LRG LVL3 (GOWN DISPOSABLE) ×4 IMPLANT
HIBICLENS CHG 4% 4OZ BTL (MISCELLANEOUS) ×4 IMPLANT
HOLDER FOLEY CATH W/STRAP (MISCELLANEOUS) ×4 IMPLANT
IV NS 1000ML (IV SOLUTION) ×8
IV NS 1000ML BAXH (IV SOLUTION) ×2 IMPLANT
KIT TURNOVER CYSTO (KITS) ×4 IMPLANT
MANIFOLD NEPTUNE II (INSTRUMENTS) ×4 IMPLANT
NDL MAYO 6 CRC TAPER PT (NEEDLE) IMPLANT
NEEDLE HYPO 22GX1.5 SAFETY (NEEDLE) ×4 IMPLANT
NEEDLE MAYO 6 CRC TAPER PT (NEEDLE) ×4 IMPLANT
NS IRRIG 1000ML POUR BTL (IV SOLUTION) ×2 IMPLANT
PACK CYSTO (CUSTOM PROCEDURE TRAY) ×4 IMPLANT
PACK VAGINAL WOMENS (CUSTOM PROCEDURE TRAY) ×4 IMPLANT
RETRACTOR LONE STAR DISPOSABLE (INSTRUMENTS) ×4 IMPLANT
RETRACTOR STAY HOOK 5MM (MISCELLANEOUS) ×4 IMPLANT
SET IRRIG Y TYPE TUR BLADDER L (SET/KITS/TRAYS/PACK) ×4 IMPLANT
SUCTION FRAZIER HANDLE 10FR (MISCELLANEOUS) ×4
SUCTION TUBE FRAZIER 10FR DISP (MISCELLANEOUS) ×3 IMPLANT
SURGIFLO W/THROMBIN 8M KIT (HEMOSTASIS) ×2 IMPLANT
SUT ABS MONO DBL WITH NDL 48IN (SUTURE) ×4 IMPLANT
SUT MON AB 2-0 SH 27 (SUTURE) IMPLANT
SUT VIC AB 0 CT1 27 (SUTURE)
SUT VIC AB 0 CT1 27XBRD ANTBC (SUTURE) IMPLANT
SUT VIC AB 2-0 SH 27 (SUTURE) ×4
SUT VIC AB 2-0 SH 27XBRD (SUTURE) ×3 IMPLANT
SUT VICRYL 2-0 SH 8X27 (SUTURE) ×4 IMPLANT
SYR BULB EAR ULCER 3OZ GRN STR (SYRINGE) ×4 IMPLANT
SYS SLING ADV FIT BLUE TRNSVAG (Sling) ×2 IMPLANT
TOWEL OR 17X26 10 PK STRL BLUE (TOWEL DISPOSABLE) ×4 IMPLANT
TRAY FOLEY W/BAG SLVR 14FR (SET/KITS/TRAYS/PACK) ×2 IMPLANT
TRAY FOLEY W/BAG SLVR 14FR LF (SET/KITS/TRAYS/PACK) ×4 IMPLANT
WATER STERILE IRR 3000ML UROMA (IV SOLUTION) ×2 IMPLANT

## 2021-11-10 NOTE — Anesthesia Preprocedure Evaluation (Signed)
Anesthesia Evaluation  Patient identified by MRN, date of birth, ID band Patient awake    Reviewed: Allergy & Precautions, NPO status , Patient's Chart, lab work & pertinent test results  Airway Mallampati: II  TM Distance: >3 FB Neck ROM: Full    Dental  (+) Teeth Intact, Dental Advisory Given   Pulmonary sleep apnea , COPD,  COPD inhaler, former smoker,    Pulmonary exam normal breath sounds clear to auscultation       Cardiovascular hypertension, + CAD and +CHF  Normal cardiovascular exam+ dysrhythmias (LBBB)  Rhythm:Regular Rate:Normal     Neuro/Psych  Headaches, PSYCHIATRIC DISORDERS Anxiety Depression TIA   GI/Hepatic Neg liver ROS, GERD  Medicated,  Endo/Other  Hypothyroidism   Renal/GU Renal InsufficiencyRenal disease Bladder dysfunction (IC)  prolapse of posterior vagina; stress urinary incontinence    Musculoskeletal negative musculoskeletal ROS (+)   Abdominal   Peds  Hematology negative hematology ROS (+)   Anesthesia Other Findings Day of surgery medications reviewed with the patient.  Reproductive/Obstetrics                             Anesthesia Physical Anesthesia Plan  ASA: 3  Anesthesia Plan: General   Post-op Pain Management: Tylenol PO (pre-op)*   Induction: Intravenous  PONV Risk Score and Plan: 4 or greater and Dexamethasone, Ondansetron and Treatment may vary due to age or medical condition  Airway Management Planned: LMA  Additional Equipment:   Intra-op Plan:   Post-operative Plan: Extubation in OR  Informed Consent: I have reviewed the patients History and Physical, chart, labs and discussed the procedure including the risks, benefits and alternatives for the proposed anesthesia with the patient or authorized representative who has indicated his/her understanding and acceptance.     Dental advisory given  Plan Discussed with: CRNA  Anesthesia  Plan Comments:         Anesthesia Quick Evaluation

## 2021-11-10 NOTE — Anesthesia Postprocedure Evaluation (Signed)
Anesthesia Post Note  Patient: TAMEYA KUZNIA  Procedure(s) Performed: POSTERIOR REPAIR WITH SACROSPINOUS FIXATION (Vagina ) TRANSVAGINAL TAPE (TVT) PROCEDURE (Vagina ) CYSTOSCOPY WITH FULGERATION (Bladder)     Patient location during evaluation: PACU Anesthesia Type: General Level of consciousness: awake and alert Pain management: pain level controlled Vital Signs Assessment: post-procedure vital signs reviewed and stable Respiratory status: spontaneous breathing, nonlabored ventilation, respiratory function stable and patient connected to nasal cannula oxygen Cardiovascular status: blood pressure returned to baseline and stable Postop Assessment: no apparent nausea or vomiting Anesthetic complications: no   No notable events documented.  Last Vitals:  Vitals:   11/10/21 1100 11/10/21 1115  BP: 106/75 102/76  Pulse: 66 68  Resp: 13 13  Temp: (!) 36.4 C (!) 36.4 C  SpO2: 91% 95%    Last Pain:  Vitals:   11/10/21 1100  TempSrc:   PainSc: (P) 0-No pain                 Santa Lighter

## 2021-11-10 NOTE — Telephone Encounter (Signed)
[name] underwent posterior repair, sacrospinous ligament fixation, sling, cystoscopy on 11/10/21.   She passed her voiding trial.  327m was backfilled into the bladder Voided 2458m PVR by bladder scan was 3035m  She was discharged without a catheter. Please call her for a routine post op check. Thanks!  [name]

## 2021-11-10 NOTE — Op Note (Addendum)
Operative Note  Preoperative Diagnosis: posterior vaginal prolapse, vaginal vault prolapse after hysterectomy, and stress urinary incontinence  Postoperative Diagnosis: same  Procedures performed:  Posterior repair, sacrospinous ligament fixation, midurethral sling, cystoscopy  Implants:  Implant Name Type Inv. Item Serial No. Manufacturer Lot No. LRB No. Used Action  SYS SLING ADV FIT BLUE TRNSVAG - [id number] Sling SYS SLING ADV FIT BLUE TRNSVAG  [place] [id number] N/A 1 Implanted    Attending Surgeon: Sherlene Shams, MD  Anesthesia: General endotracheal  Findings: 1. On vaginal exam, stage II posterior vaginal prolapse noted  2. On cystoscopy, normal urethra. Perforation in bladder mucosa on the left after initial sling placement. No evidence of perforation or mesh in bladder with replacement of sling. Brisk bilateral ureteral efflux present.    Specimens: none  Estimated blood loss: 75 mL  IV fluids: 700 mL  Urine output: 735 mL  Complications: Bladder perforation with sling occurred on the left. Corrected with repassing the sling.   Procedure in Detail:   After informed consent was obtained, the patient was taken to the operating room where anesthesia was induced and found to be adequate. She was placed in dorsal lithotomy position, taking care to avoid any traction on the extremities, and then prepped and draped in the usual sterile fashion. A self-retaining lonestar retractor was placed using four elastic blue stays.  After a foley catheter was inserted into the urethra.  Vaginal exam concluded that an apical procedure was also needed. Two Allis clamps were along the posterior vaginal wall defect. 1% lidocaine with epinephrine was injected into the vaginal mucosa.  A vertical incision was made between these two Allis clamps with a 15 blade scalpel.  Allis clamps were placed along this incision and Metzenbaum scissors were used to undermine the vaginal mucosa along the  incision.  The vaginal mucosa was then sharply dissected off to the septum bilaterally.    For the sacrospinous ligament fixation (SSLF), the ischial spine was accessed on the right side via dissection with scissors and blunt dissection.  The sacrospinous ligament was palpated. Two 0 PDS suture was then placed at the sacrospinous ligament two fingerbreadths medial to the ischial spine, in order to avoid the pudendal neurovascular bundle, using a Capio needle driver.  The PDS suture was attached to the vaginal epithelium on the ipsilateral side of the vaginal apex and held. Posterior plication of the rectovaginal septum was then performed using plicating sutures of 2-0 Vicryl. The last distal stitch incorporated the perineal body in a U stitch fashion. The vaginal mucosal edges were trimmed and the incision reapproximated with 2-0 Vicryl in a running fashion. The SSLF suture was then tied down with excellent support of the posterior and apical vagina.  The sling was performed next. The mid urethral area was located on the anterior vaginal wall.  Two Allis clamps were placed at the level of the midurethra. 1% lidocaine with epinephrine was injected into the vaginal mucosa. A vertical incision was made between the two clamps using a 15-blade scalpel.  Using sharp dissection, Metzenbaum scissors were used to make a periurethral tunnel from the vaginal incision towards the pubic rami bilaterally for the future sling tracts. The bladder was ensured to be empty. The trocar and attached sling were introduced into the right side of the periurethral vaginal incision, just inferior to the pubic symphysis on the right side. The trocar was guided through the endopelvic fascia and directly vertically.  While hugging the cephalad surface of the pubic  bone, the trocar was guided out through the abdomen 2 fingerbreadths lateral to midline at the level of the pubic symphysis on the ipsilateral side. As the left sided trocar was  being placed behind the pubic bone, the patient moved. The trocar was immediately removed and waited to replace the sling until good sedation was achieved. The trocar was placed on the left side in a similar fashion to the right.  A 70-degree cystoscope was introduced, and 360-degree inspection revealed no trocars in the bladder, but two perforations were present on the left from initial sling placement. A clot was overlying and this was removed. Active bleeding from the perforation sites was cauterized with a bugbee. Good hemostasis was noted.  Brisk bilateral ureteral efflux was present.  The bladder was drained and the cystoscope was removed.  The Foley catheter was reinserted.  The sling was brought to lie beneath the mid-urethra.  A needle driver was placed behind the sling to ensure no tension.   The plastic sheath was removed from the sling and the distal ends of the sling were trimmed just below the level of the skin incisions.  Tension-free positioning of the sling was confirmed. Suriflo was placed into the incision and good hemostasis was noted. Vaginal inspection revealed no vaginotomy or sling perforations of the mucosa.  The vaginal mucosal edges were reapproximated using 2-0 Vicryl.  The vagina was copiously irrigated.  Hemostasis was again noted. Vaginal packing not placed. The suprapubic sling incisions were closed with Dermabond. Rectal exam was performed to ensure no sutures in the rectum. The patient tolerated the procedure well.  She was awakened from anesthesia and transferred to the recovery room in stable condition. All needle and sponge counts were correct x 2.    Lauren Folds, MD

## 2021-11-10 NOTE — Interval H&P Note (Signed)
History and Physical Interval Note:  11/10/2021 7:56 AM  [name]  has presented today for surgery, with the diagnosis of prolapse of posterior vagina; stress urinary incontinence.  The various methods of treatment have been discussed with the patient and family. After consideration of risks, benefits and other options for treatment, the patient has consented to  Procedure(s) with comments: POSTERIOR REPAIR WITH SACROSPINOUS FIXATION (N/A) TRANSVAGINAL TAPE (TVT) PROCEDURE (N/A) CYSTOSCOPY (N/A) as a surgical intervention.  The patient's history has been reviewed, patient examined, no change in status, stable for surgery.  I have reviewed the patient's chart and labs.  Questions were answered to the patient's satisfaction.     [name]

## 2021-11-10 NOTE — Transfer of Care (Signed)
Immediate Anesthesia Transfer of Care Note  Patient: [name]  Procedure(s) Performed: POSTERIOR REPAIR WITH SACROSPINOUS FIXATION (Vagina ) TRANSVAGINAL TAPE (TVT) PROCEDURE (Vagina ) CYSTOSCOPY WITH FULGERATION (Bladder)  Patient Location: PACU  Anesthesia Type:General  Level of Consciousness: awake, alert , oriented and patient cooperative  Airway & Oxygen Therapy: Patient Spontanous Breathing and Patient connected to face mask oxygen  Post-op Assessment: Report given to RN and Post -op Vital signs reviewed and stable  Post vital signs: Reviewed and stable  Last Vitals:  Vitals Value Taken Time  BP 110/75 11/10/21 1028  Temp    Pulse 78 11/10/21 1029  Resp 14 11/10/21 1029  SpO2 100 % 11/10/21 1029  Vitals shown include unvalidated device data.  Last Pain:  Vitals:   11/10/21 0728  TempSrc: Oral  PainSc: 0-No pain      Patients Stated Pain Goal: 6 (80/16/55 3748)  Complications: No notable events documented.

## 2021-11-10 NOTE — Anesthesia Procedure Notes (Signed)
Procedure Name: LMA Insertion Date/Time: 11/10/2021 8:31 AM  Performed by: Garrel Ridgel, CRNAPre-anesthesia Checklist: Patient identified, Emergency Drugs available, Suction available and Patient being monitored Patient Re-evaluated:Patient Re-evaluated prior to induction Oxygen Delivery Method: Circle system utilized Preoxygenation: Pre-oxygenation with 100% oxygen Induction Type: IV induction Ventilation: Mask ventilation without difficulty LMA: LMA inserted LMA Size: 4.0 Number of attempts: 1 Placement Confirmation: positive ETCO2 Tube secured with: Tape Dental Injury: Teeth and Oropharynx as per pre-operative assessment

## 2021-11-10 NOTE — Discharge Instructions (Addendum)
POST OPERATIVE INSTRUCTIONS  General Instructions Recovery (not bed rest) will last approximately 6 weeks Walking is encouraged, but refrain from strenuous exercise/ housework/ heavy lifting. No lifting >10lbs  Nothing in the vagina- NO intercourse, tampons or douching Bathing:  Do not submerge in water (NO swimming, bath, hot tub, etc) until after your postop visit. You can shower starting the day after surgery.  No driving until you are not taking narcotic pain medicine and until your pain is well enough controlled that you can slam on the breaks or make sudden movements if needed.   Taking your medications Please take your acetaminophen and ibuprofen on a schedule for the first 48 hours. Take 600mg ibuprofen, then take 500mg acetaminophen 3 hours later, then continue to alternate ibuprofen and acetaminophen. That way you are taking each type of medication every 6 hours. Take the prescribed narcotic (oxycodone, tramadol, etc) as needed, with a maximum being every 4 hours.  Take a stool softener daily to keep your stools soft and preventing you from straining. If you have diarrhea, you decrease your stool softener. This is explained more below. We have prescribed you Miralax.  Reasons to Call the Nurse (see last page for phone numbers) Heavy Bleeding (changing your pad every 1-2 hours) Persistent nausea/vomiting Fever (100.4 degrees or more) Incision problems (pus or other fluid coming out, redness, warmth, increased pain)  Things to Expect After Surgery Mild to Moderate pain is normal during the first day or two after surgery. If prescribed, take Ibuprofen or Tylenol first and use the stronger medicine for "break-through" pain. You can overlap these medicines because they work differently.   Constipation   To Prevent Constipation:  Eat a well-balanced diet including protein, grains, fresh fruit and vegetables.  Drink plenty of fluids. Walk regularly.  Depending on specific instructions  from your physician: take Miralax daily and additionally you can add a stool softener (colace/ docusate) and fiber supplement. Continue as long as you're on pain medications.   To Treat Constipation:  If you do not have a bowel movement in 2 days after surgery, you can take 2 Tbs of Milk of Magnesia 1-2 times a day until you have a bowel movement. If diarrhea occurs, decrease the amount or stop the laxative. If no results with Milk of Magnesia, you can drink a bottle of magnesium citrate which you can purchase over the counter.  Fatigue:  This is a normal response to surgery and will improve with time.  Plan frequent rest periods throughout the day.  Gas Pain:  This is very common but can also be very painful! Drink warm liquids such as herbal teas, bouillon or soup. Walking will help you pass more gas.  Mylicon or Gas-X can be taken over the counter.  Leaking Urine:  Varying amounts of leakage may occur after surgery.  This should improve with time. Your bladder needs at least 3 months to recover from surgery. If you leak after surgery, be sure to mention this to your doctor at your post-op visit. If you were taking medications for overactive bladder prior to surgery, be sure to restart the medications immediately after surgery.  Incisions: If you have incisions on your abdomen, the skin glue will dissolve on its own over time. It is ok to gently rinse with soap and water over these incisions but do not scrub.  Catheter Approximately 50% of patients are unable to urinate after surgery and need to go home with a catheter. This allows your bladder to   rest so it can return to full function. If you go home with a catheter, the office will call to set up a voiding trial a few days after surgery. For most patients, by this visit, they are able to urinate on their own. Long term catheter use is rare.   Return to Work  As work demands and recovery times vary widely, it is hard to predict when you will want  to return to work. If you have a desk job with no strenuous physical activity, and if you would like to return sooner than generally recommended, discuss this with your provider or call our office.   Post op concerns  For non-emergent issues, please call the Urogynecology Nurse. Please leave a message and someone will contact you within one business day.  You can also send a message through MyChart.   AFTER HOURS (After 5:00 PM and on weekends):  For urgent matters that cannot wait until the next business day. Call our office [phone] and connect to the doctor on call.  Please reserve this for important issues.   **FOR ANY TRUE EMERGENCY ISSUES CALL 911 OR GO TO THE NEAREST EMERGENCY ROOM.** Please inform our office or the doctor on call of any emergency.     APPOINTMENTS: Call [phone]   Post Anesthesia Home Care Instructions  Activity: Get plenty of rest for the remainder of the day. A responsible individual must stay with you for 24 hours following the procedure.  For the next 24 hours, DO NOT: -Drive a car -Operate machinery -Drink alcoholic beverages -Take any medication unless instructed by your physician -Make any legal decisions or sign important papers.  Meals: Start with liquid foods such as gelatin or soup. Progress to regular foods as tolerated. Avoid greasy, spicy, heavy foods. If nausea and/or vomiting occur, drink only clear liquids until the nausea and/or vomiting subsides. Call your physician if vomiting continues.  Special Instructions/Symptoms: Your throat may feel dry or sore from the anesthesia or the breathing tube placed in your throat during surgery. If this causes discomfort, gargle with warm salt water. The discomfort should disappear within 24 hours.  If you had a scopolamine patch placed behind your ear for the management of post- operative nausea and/or vomiting:  1. The medication in the patch is effective for 72 hours, after which it should be  removed.  Wrap patch in a tissue and discard in the trash. Wash hands thoroughly with soap and water. 2. You may remove the patch earlier than 72 hours if you experience unpleasant side effects which may include dry mouth, dizziness or visual disturbances. 3. Avoid touching the patch. Wash your hands with soap and water after contact with the patch.     

## 2021-11-11 ENCOUNTER — Encounter: Payer: Self-pay | Admitting: *Deleted

## 2021-11-11 ENCOUNTER — Encounter (HOSPITAL_BASED_OUTPATIENT_CLINIC_OR_DEPARTMENT_OTHER): Payer: Self-pay | Admitting: Obstetrics and Gynecology

## 2021-11-18 ENCOUNTER — Ambulatory Visit
Admission: RE | Admit: 2021-11-18 | Discharge: 2021-11-18 | Disposition: A | Discharge status: Home / Self Care | Payer: PPO | Source: Ambulatory Visit | Attending: Adult Health | Admitting: Adult Health

## 2021-11-18 DIAGNOSIS — Z780 Asymptomatic menopausal state: Secondary | ICD-10-CM | POA: Diagnosis not present

## 2021-11-18 DIAGNOSIS — M8580 Other specified disorders of bone density and structure, unspecified site: Secondary | ICD-10-CM

## 2021-11-18 DIAGNOSIS — Z1231 Encounter for screening mammogram for malignant neoplasm of breast: Secondary | ICD-10-CM | POA: Diagnosis not present

## 2021-11-18 DIAGNOSIS — M8589 Other specified disorders of bone density and structure, multiple sites: Secondary | ICD-10-CM | POA: Diagnosis not present

## 2021-11-20 ENCOUNTER — Other Ambulatory Visit (HOSPITAL_COMMUNITY): Payer: Self-pay

## 2021-11-20 ENCOUNTER — Other Ambulatory Visit: Payer: Self-pay | Admitting: Internal Medicine

## 2021-11-20 MED ORDER — SERTRALINE HCL 100 MG PO TABS
ORAL_TABLET | ORAL | 3 refills | Status: DC
  Filled 2021-11-20: qty 90, 90d supply, fill #0
  Filled 2022-02-19: qty 90, 90d supply, fill #1
  Filled 2022-05-14: qty 90, 90d supply, fill #2
  Filled 2022-08-11: qty 90, 90d supply, fill #3

## 2021-11-21 ENCOUNTER — Other Ambulatory Visit: Payer: Self-pay

## 2021-11-21 MED ORDER — PANTOPRAZOLE SODIUM 20 MG PO TBEC: 20.0000 mg | DELAYED_RELEASE_TABLET | Freq: Every day | ORAL | 2 refills | Status: DC

## 2021-11-24 DIAGNOSIS — I10 Essential (primary) hypertension: Secondary | ICD-10-CM | POA: Diagnosis not present

## 2021-11-24 DIAGNOSIS — N1831 Chronic kidney disease, stage 3a: Secondary | ICD-10-CM | POA: Diagnosis not present

## 2021-11-24 DIAGNOSIS — E782 Mixed hyperlipidemia: Secondary | ICD-10-CM | POA: Diagnosis not present

## 2021-11-24 DIAGNOSIS — E663 Overweight: Secondary | ICD-10-CM | POA: Diagnosis not present

## 2021-12-01 ENCOUNTER — Telehealth: Payer: Self-pay

## 2021-12-01 NOTE — Telephone Encounter (Signed)
LM-12/01/21-LM-12/01/21-Called pt. to complete RPM unenrollment. Unable to reach pt. Left detailed message regarding her weights being stable, discuss RPM ending, and to return call when she decides if she'd like to keep her scale or return it.  Total time spent: 2 min.

## 2021-12-02 ENCOUNTER — Other Ambulatory Visit (HOSPITAL_COMMUNITY): Payer: Self-pay

## 2021-12-04 NOTE — Telephone Encounter (Signed)
LM-12/04/21- Pt. returned call and LVM on 8/10. Called patient back and informed her that CP reviewed her readings and informed her about RPM program ending and that we will not be getting her weight readings sent to [place] anymore. Pt. verbalized understanding and agreed. Offered pt. to keep device and keep a log of weight readings daily or to return the device. Pt. opted to keep device. Advised pt. to keep a log daily and provided my contact information to call if she gains 3 or more pounds in one day, or 5 pounds in 1 week. Pt. verbalized understanding and agreed. Informed pt. I will reach out to he rin few weeks, just to make sure her weight readings are within range. Pt. verbalized understanding and agreed.    Total time spent: 10 min.

## 2021-12-15 ENCOUNTER — Telehealth: Payer: Self-pay

## 2021-12-15 NOTE — Telephone Encounter (Signed)
LM-12/15/21-Chart review started. Reviewing OV, Consults, Hospital visits, Labs and medication changes. Chart review completed. Called  pt. to complete General Review Call, and pt. stated now was not a good time. Pt. was sleeping. Pt. requested I call anytime after 11AM.  Informed pt. I will reach out later today,. Pt. verbalized understanding and agreed. Apologized to pt. for waking her up. Put note in pts. chart "notes section" to call pt. after 11AM.  Total time spent: 12 min.

## 2021-12-15 NOTE — Telephone Encounter (Signed)
LM-12/15/21-Called pt. And completed General Review Call. General Review call completed. Pt. Stated she is doing well with no health concerns except she noted she has being having reflux and bladder spasms lately. PT. Denied fever, chills, N&V, or burning during urination. PT. Stated she is taking her Hyoscyamine 4 times daily to see if it helps her bladder spasms. Advised pt. To reach out to her surgeon that did her bladder sling if she gets worse. Pt. Stated she has a f/u w/ her surgeon next week.  Total time spent: 30 min.

## 2021-12-23 ENCOUNTER — Ambulatory Visit: Payer: PPO | Admitting: Adult Health

## 2021-12-23 ENCOUNTER — Ambulatory Visit (INDEPENDENT_AMBULATORY_CARE_PROVIDER_SITE_OTHER): Payer: PPO | Admitting: Obstetrics and Gynecology

## 2021-12-23 ENCOUNTER — Encounter: Payer: Self-pay | Admitting: Obstetrics and Gynecology

## 2021-12-23 ENCOUNTER — Other Ambulatory Visit (HOSPITAL_COMMUNITY)
Admission: RE | Admit: 2021-12-23 | Discharge: 2021-12-23 | Disposition: A | Discharge status: Home / Self Care | Payer: PPO | Source: Ambulatory Visit | Attending: Obstetrics and Gynecology | Admitting: Obstetrics and Gynecology

## 2021-12-23 VITALS — BP 104/67 | HR 82

## 2021-12-23 DIAGNOSIS — N898 Other specified noninflammatory disorders of vagina: Secondary | ICD-10-CM | POA: Insufficient documentation

## 2021-12-23 DIAGNOSIS — Z9889 Other specified postprocedural states: Secondary | ICD-10-CM

## 2021-12-23 DIAGNOSIS — R300 Dysuria: Secondary | ICD-10-CM | POA: Diagnosis not present

## 2021-12-23 DIAGNOSIS — R3989 Other symptoms and signs involving the genitourinary system: Secondary | ICD-10-CM

## 2021-12-23 DIAGNOSIS — N3010 Interstitial cystitis (chronic) without hematuria: Secondary | ICD-10-CM | POA: Diagnosis not present

## 2021-12-23 DIAGNOSIS — M62838 Other muscle spasm: Secondary | ICD-10-CM | POA: Diagnosis not present

## 2021-12-23 DIAGNOSIS — R82998 Other abnormal findings in urine: Secondary | ICD-10-CM

## 2021-12-23 MED ORDER — SODIUM BICARBONATE 8.4 % IV SOLN
5.0000 mL | Freq: Once | INTRAVENOUS | Status: AC
  Administered 2021-12-23: 5 mL

## 2021-12-23 MED ORDER — LIDOCAINE HCL 2 % IJ SOLN
20.0000 mL | Freq: Once | INTRAMUSCULAR | Status: AC
  Administered 2021-12-23: 400 mg

## 2021-12-23 MED ORDER — BUPIVACAINE HCL (PF) 0.25 % IJ SOLN
20.0000 mL | Freq: Once | INTRAMUSCULAR | Status: AC
  Administered 2021-12-23: 20 mL

## 2021-12-23 MED ORDER — HEPARIN SODIUM (PORCINE) 10000 UNIT/ML IJ SOLN
10000.0000 [IU] | Freq: Once | INTRAMUSCULAR | Status: AC
  Administered 2021-12-23: 10000 [IU] via INTRAVESICAL

## 2021-12-23 NOTE — Progress Notes (Signed)
[facility]  Date of Visit: 12/23/2021  History of Present Illness: [name] is a 71 y.o. female scheduled today for a post-operative visit.   Surgery: s/p Posterior repair, sacrospinous ligament fixation, midurethral sling, cystoscopy on 11/10/21  She passed her postoperative void trial.   Postoperative course has been uncomplicated.   Today she reports she is taking hyoscyamine 4 times a day for bladder spasms. She is still having pain with urination consistent with IC exacerbation. She thinks it was the pizza (with sauce) that she had a few days ago. She has taken DMSO instillations many years ago and this has helped her symptoms. She also has been using vaginal estrogen cream.   UTI in the last 6 weeks? No  Pain? No , other than with her bladder currently.  Vaginal bulge? No  Stress incontinence: Yes - but not sure if it has improved.  Urgency/frequency: Yes  Urge incontinence: Yes  Voiding dysfunction: No , empties bladder well.  Bowel issues: Yes - has been taking miralax as needed  Subjective Success: Do you usually have a bulge or something falling out that you can see or feel in the vaginal area? No  Retreatment Success: Any retreatment with surgery or pessary for any compartment? No    Medications: She has a current medication list which includes the following prescription(s): alprazolam, aspirin ec, bupropion, cetirizine, vitamin d-3, cinnamon, diphenhydramine, estradiol, guaifenesin, hyoscyamine, ipratropium-albuterol, levothyroxine, magnesium, ondansetron, pantoprazole, phenazopyridine, rosuvastatin, sertraline, and famotidine.   Allergies: Patient is allergic to ace inhibitors, cephalexin, dilaudid [hydromorphone hcl], robaxin [methocarbamol], macrobid [nitrofurantoin], levaquin [levofloxacin], neurontin [gabapentin], codeine, latex, and sulfa antibiotics.   Physical Exam: BP 104/67   Pulse 82    Pelvic Examination: Vagina: small amount of white discharge  present, aptima swab obtained. Incisions healing well. Sutures are present at incision line and there is not granulation tissue. No tenderness along the anterior or posterior vagina. No apical tenderness. No pelvic masses. No visible or palpable mesh.  POP-Q: POP-Q  -2.5                                            Aa   -2.5                                           Ba  -6                                              C   3                                            Gh  4                                            Pb  7  tvl   -3                                            Ap  -3                                            Bp                                                 D   POC urine: small leukocytes  ---------------------------------------------------------  Assessment and Plan:  1. Post-operative state   2. Leukocytes in urine   3. Vaginal irritation   4. Bladder pain    - Does not have obvious infection but will send urine for culture.  - For bladder pain/ IC exacerbation- Discussed option of bladder instillation and given today: lidocaine, bupivacaine, sodium bicarb and heparin. She will return in 2 weeks and can consider further instillations at that time if she saw improvement.  - Also sent aptima swab today due to vaginal discharge.  - Will reassess leakage once her bladder pain has improved.  - Can resume regular activity including exercise and intercourse,  if desired.  - Discussed avoidance of heavy lifting and straining long term to reduce the risk of recurrence.   Return 2 weeks  Jaquita Folds, MD

## 2021-12-23 NOTE — Telephone Encounter (Signed)
LM-12/23/21-Called pt. To check in on her weight readings since RPM unenrollment. Unable to reach pt. [place] X1.  Total time spent: 2 min.

## 2021-12-24 LAB — CERVICOVAGINAL ANCILLARY ONLY
Bacterial Vaginitis (gardnerella): NEGATIVE
Candida Glabrata: NEGATIVE
Candida Vaginitis: NEGATIVE
Comment: NEGATIVE
Comment: NEGATIVE
Comment: NEGATIVE

## 2021-12-24 LAB — URINE CULTURE: Culture: NO GROWTH

## 2021-12-25 DIAGNOSIS — N1831 Chronic kidney disease, stage 3a: Secondary | ICD-10-CM | POA: Diagnosis not present

## 2021-12-25 DIAGNOSIS — E782 Mixed hyperlipidemia: Secondary | ICD-10-CM | POA: Diagnosis not present

## 2021-12-25 DIAGNOSIS — I10 Essential (primary) hypertension: Secondary | ICD-10-CM | POA: Diagnosis not present

## 2021-12-25 DIAGNOSIS — E663 Overweight: Secondary | ICD-10-CM | POA: Diagnosis not present

## 2021-12-25 NOTE — Telephone Encounter (Signed)
LM-12/25/21-Called pt. To assess weight readings. Pt. Stated she is doing the same. Her current weight is 165lbs. Advised pt. To call if she has any questions/concerns. Pt. Verbalized understanding and agreed.  Total time spent: 5 min.

## 2022-01-04 ENCOUNTER — Other Ambulatory Visit: Payer: Self-pay | Admitting: Internal Medicine

## 2022-01-04 DIAGNOSIS — E782 Mixed hyperlipidemia: Secondary | ICD-10-CM

## 2022-01-04 DIAGNOSIS — I2510 Atherosclerotic heart disease of native coronary artery without angina pectoris: Secondary | ICD-10-CM

## 2022-01-04 MED ORDER — ROSUVASTATIN CALCIUM 5 MG PO TABS: ORAL_TABLET | ORAL | 0 refills | Status: DC

## 2022-01-07 ENCOUNTER — Encounter: Payer: Self-pay | Admitting: Obstetrics and Gynecology

## 2022-01-07 ENCOUNTER — Ambulatory Visit: Payer: PPO | Admitting: Obstetrics and Gynecology

## 2022-01-07 VITALS — BP 89/66 | HR 98

## 2022-01-07 DIAGNOSIS — R3989 Other symptoms and signs involving the genitourinary system: Secondary | ICD-10-CM | POA: Diagnosis not present

## 2022-01-07 DIAGNOSIS — N3281 Overactive bladder: Secondary | ICD-10-CM | POA: Diagnosis not present

## 2022-01-07 MED ORDER — VIBEGRON 75 MG PO TABS: 75.0000 mg | ORAL_TABLET | Freq: Every day | ORAL | 5 refills | Status: DC

## 2022-01-07 NOTE — Progress Notes (Signed)
[facility] Return Visit  SUBJECTIVE  History of Present Illness: [name] is a 71 y.o. female seen in follow-up for bladder pain and incontinence.   Surgery: s/p Posterior repair, sacrospinous ligament fixation, midurethral sling, cystoscopy on 11/10/21  At last visit, had an IC exacerbation and had a bladder instillation, but it did not help her. Urine culture was negative. Aptima swab was negative for infection. She has pain in the urethra- feeling of pinching and twisting.   As far as the urinary leakage, she has good days and bad days. Has urgency and leakage. Has some spasm pain with leakage. She has not had any leakage with coughing/ sneezing.    Past Medical History: Patient  has a past medical history of Chronic diastolic CHF (congestive heart failure) ([place]), Chronic kidney disease (CKD), stage III (moderate) ([place]), Depression, Diverticulosis of colon, Emphysema of lung ([place]), GERD (gastroesophageal reflux disease), History of acute pyelonephritis, History of Helicobacter pylori infection (2012), History of recurrent UTIs, History of TIA (transient ischemic attack), Hyperlipidemia, mixed, Hypertension, Hypothyroidism, IBS (irritable bowel syndrome), IC (interstitial cystitis), Inguinal hernia, bilateral, Irritable bowel syndrome with constipation and diarrhea, LBBB (left bundle branch block) (2015), OCD (obsessive compulsive disorder), OSA (obstructive sleep apnea), Osteopenia, Prolapsed hemorrhoids, Seasonal allergies, and Vitamin D deficiency.   Past Surgical History: She  has a past surgical history that includes Tonsillectomy; Bunionectomy (Bilateral, 1997); Hemorrhoid surgery (1976); Breast enhancement surgery (Bilateral); ORIF ankle fracture (Left, 08/30/2017); Breast implant removal (Bilateral, 03/07/2019); Esophagogastroduodenoscopy (egd) with propofol (N/A, 03/12/2019); Colonoscopy (10/2020); Cataract extraction w/ intraocular lens implant (Right, 2005);  Inguinal hernia repair (N/A, 04/10/2021); Anterior and posterior repair with sacrospinous fixation (N/A, 11/10/2021); Bladder suspension (N/A, 11/10/2021); and Cystoscopy with fulgeration (11/10/2021).   Medications: She has a current medication list which includes the following prescription(s): alprazolam, aspirin ec, bupropion, cetirizine, vitamin d-3, cinnamon, diphenhydramine, estradiol, guaifenesin, hyoscyamine, ipratropium-albuterol, levothyroxine, magnesium, ondansetron, pantoprazole, phenazopyridine, rosuvastatin, sertraline, vibegron, and famotidine.   Allergies: Patient is allergic to ace inhibitors, cephalexin, dilaudid [hydromorphone hcl], robaxin [methocarbamol], macrobid [nitrofurantoin], levaquin [levofloxacin], neurontin [gabapentin], codeine, latex, and sulfa antibiotics.   Social History: Patient  reports that she quit smoking about 19 years ago. Her smoking use included cigarettes. She has a 40.00 pack-year smoking history. She has never used smokeless tobacco. She reports current alcohol use. She reports that she does not use drugs.      OBJECTIVE     Physical Exam: Vitals:   01/07/22 1251  BP: (!) 89/66  Pulse: 98   Gen: No apparent distress, A&O x 3.  Detailed Urogynecologic Evaluation:  Deferred.    ASSESSMENT AND PLAN    [name] is a 71 y.o. with:  1. Overactive bladder   2. Bladder pain    - She feels that her OAB symptoms are more bothersome. Discussed options of medication, bladder botox, SNM and PTNS. Handouts provided and she is considering her options.  - In the meantime, she would like to try another medication (has tried Myrbetriq). Gemtesa '75mg'$  prescribed and two weeks of samples provided.   Follow up 2 months  Jaquita Folds, MD

## 2022-01-14 NOTE — Progress Notes (Signed)
Submitted PA for Gemtesa '75mg'$  on 01/12/2022 through [employer]  Request ID: [id number]    Status: Approved  Prior Auth: Coverage Start Date:01/12/2022; Coverage End Date:04/26/2022

## 2022-01-21 DIAGNOSIS — N183 Chronic kidney disease, stage 3 unspecified: Secondary | ICD-10-CM | POA: Diagnosis not present

## 2022-01-21 DIAGNOSIS — Z87448 Personal history of other diseases of urinary system: Secondary | ICD-10-CM | POA: Diagnosis not present

## 2022-01-21 DIAGNOSIS — N390 Urinary tract infection, site not specified: Secondary | ICD-10-CM | POA: Diagnosis not present

## 2022-01-21 DIAGNOSIS — E039 Hypothyroidism, unspecified: Secondary | ICD-10-CM | POA: Diagnosis not present

## 2022-01-21 DIAGNOSIS — E559 Vitamin D deficiency, unspecified: Secondary | ICD-10-CM | POA: Diagnosis not present

## 2022-01-21 DIAGNOSIS — I129 Hypertensive chronic kidney disease with stage 1 through stage 4 chronic kidney disease, or unspecified chronic kidney disease: Secondary | ICD-10-CM | POA: Diagnosis not present

## 2022-01-27 ENCOUNTER — Other Ambulatory Visit: Payer: Self-pay

## 2022-01-27 DIAGNOSIS — K580 Irritable bowel syndrome with diarrhea: Secondary | ICD-10-CM

## 2022-01-27 MED ORDER — HYOSCYAMINE SULFATE 0.125 MG SL SUBL: SUBLINGUAL_TABLET | SUBLINGUAL | 3 refills | Status: DC

## 2022-02-16 ENCOUNTER — Encounter: Payer: Self-pay | Admitting: Internal Medicine

## 2022-02-16 NOTE — Progress Notes (Signed)
Future Appointments  Date Time Provider Department  02/17/2022  9:30 AM Unk [name], MD [facility]  03/09/2022  9:20 AM Berton Mount, NP [facility]  04/28/2022 11:00 AM Carleene Mains, RPH [facility]  06/16/2022                 cpe 10:00 AM Darrol Jump, NP [facility]  09/15/2022                 wellness 11:00 AM Darrol Jump, NP [facility]    History of Present Illness:       This very nice 71 y.o. DWF presents for 3 month follow up with HTN, HLD, ADDHypothyroid, Pre-Diabetes and Vitamin D Deficiency. Patient has GERD controlled on her meds. In 2020,  patient was found to have a (+) RPR and MHA-TP with a negative CSF tap and was dx'd & treated for secondary syphilis at the [facility].    Patient hx/o inattentive type ADD & Depression for which she's on Wellbutrin with good clinical response.         Patient is followed expectantly with for HTN  (2003)  & BP has been controlled at home. Today's BP is at goal - 120/80. Patient is  followed by Dr Arty Baumgartner for  PQZ3A .  Patient has had no complaints of any cardiac type chest pain, palpitations, dyspnea /orthopnea / PND, dizziness, claudication or dependent edema.       Hyperlipidemia is not controlled with diet & Rosuvastatin .   Patient denies myalgias or other med SE's.  Last Lipids were not at goal :  Lab Results  Component Value Date   CHOL 204 (H) 10/15/2021   HDL 61 10/15/2021   LDLCALC 117 (H) 10/15/2021   TRIG 145 10/15/2021   CHOLHDL 3.3 10/15/2021     Also, the patient has history of PreDiabetes (A1c 5.8% /2011 &  6.0% /2012) and has had no symptoms of reactive hypoglycemia, diabetic polys, paresthesias or visual blurring.   Last A1c was normal & at goal :  Lab Results  Component Value Date   HGBA1C 5.4 06/13/2021                                                Patient was dx'd Hypothyroid circa 1990's and initiated on thyroid replacement.                                                      Further,  the patient also has history of Vitamin D Deficiency  ( "218" /2008) and supplements vitamin D . Last vitamin D was at goal :  Lab Results  Component Value Date   VD25OH 83 06/13/2021     Current Outpatient Medications on File Prior to Visit  Medication Sig   ALPRAZolam 0.5 MG tablet TAKE 1/2-1 TABLET ONCE A DAY IF NEEDED FOR SEVERE ANXIETY OR SLEEP   aspirin EC 81 MG tablet Take daily.    buPROPion  XL 150 MG  Take 1 tablet daily.   Cetirizine 10 MG  Take  daily.   VITAMIN D  5000 UNITS TABS Take  daily.    Cinnamon 500 MG capsule Take  daily.  diphenhydrAMINE  25 MG tablet Take every 6 hours as needed.   estradiol 0.1 MG/GM vag crm Place 0.5 g vaginally 2 times a week   famotidine 20 MG tablet Take 40 mg 2 times daily.)   guaifenesin 400 MG TABS tablet Take  daily.   hyoscyamine SL  0.125 MG SL tab DISSOLVE 1 TAB SL  EVERY 4 HRS IF NEEDED    DUONEB 0.5-2.5 (3) MG/3ML SOLN USE 1 VIAL EVERY 4 HRS AS NEEDED    levothyroxine 125 MCG tablet Take 1 tablet  on MWF \ & 1/2 tab TThSS   Magnesium 400 MG CAPS Take 1 capsule daily.   ondansetron  8 MG tablet TAKE 1 TABLET  3  x/DAY AS NEEDED    pantoprazole (PROTONIX) 20 MG tablet Take 1 tablet  daily.   phenazopyridine 200 MG tablet Take 1 tablet  3 x / daily as needed for pain.   rosuvastatin  5 MG tablet Take  1 tablet 3 x /week   sertraline 100 MG tablet Take 1 tablet daily for mood   Vibegron 75 MG TABS Take  daily.     Allergies  Allergen Reactions   Ace Inhibitors Other (See Comments)    Acute renal failure (lisinopril)   Cephalexin Anaphylaxis   Dilaudid [Hydromorphone Hcl] Anaphylaxis   Robaxin [Methocarbamol] Anaphylaxis   Macrobid [Nitrofurantoin] Other (See Comments)    Causes hyperthermia    Levaquin [Levofloxacin] Nausea And Vomiting   Neurontin [Gabapentin]     Confusion    Codeine Nausea Only   Latex Rash   Sulfa Antibiotics Other (See Comments)    Do not take regularly per nephrology.  Can use for acute  issues.      PMHx:   Past Medical History:  Diagnosis Date   Chronic diastolic CHF (congestive heart failure) ([place])    cardriologist--- dr Viona Gilmore. Audie Box;   dx in setting sepsis 11/ 2020;   echo in epic 03-10-2019 ef 55-60%, G1DD, mild to moderate TR no stenosis and normal RASP;  CTA 01-31-2021 epic   Chronic kidney disease (CKD), stage III (moderate) ([place])    nephrologist--- dr Amedeo Gory   Depression    Diverticulosis of colon    Emphysema of lung ([place])    no home oxygen   GERD (gastroesophageal reflux disease)    History of acute pyelonephritis    w/ hx sepsis   History of Helicobacter pylori infection 2012   History of recurrent UTIs    History of TIA (transient ischemic attack)    TIAs   Hyperlipidemia, mixed    Hypertension    followed by pcp and nephrologist----  (nuclear stress test in epic 06-28-2015 low risk  no ischemia, nuclear ef 60%) no meds   Hypothyroidism    IBS (irritable bowel syndrome)    IC (interstitial cystitis)    remote hx   Inguinal hernia, bilateral    Irritable bowel syndrome with constipation and diarrhea    LBBB (left bundle branch block) 2015   cardiologist --- dr Audie Box;  known chronic LBBB for several yrs   OCD (obsessive compulsive disorder)    OSA (obstructive sleep apnea)    no CPAP intolerant   Osteopenia    Prolapsed hemorrhoids    Seasonal allergies    Vitamin D deficiency      Immunization History  Administered Date(s) Administered   Influenza Split 01/31/2016, 05/12/2018, 03/17/2019   Influenza, High Dose\ 03/22/2017, 02/16/2018, 03/22/2019, 03/24/2021   Influenza 01/31/2016, 02/09/2020   PFIZER  Covid-19 Vaccine 03/31/2021   PFIZER-SARS-COV-2 Vacc 07/05/2019, 07/26/2019, 03/04/2020   Pneumococcal -13 03/22/2017, 06/29/2018   Pneumococcal -23 02/12/1999, 06/29/2018   Td 04/27/2013   Zoster Recombinat (Shingrix) 06/14/2020, 08/28/2020   Zoster, Live 11/24/2005     Past Surgical History:  Procedure Laterality Date    ANTERIOR AND POSTERIOR REPAIR WITH SACROSPINOUS FIXATION N/A 11/10/2021   Procedure: POSTERIOR REPAIR WITH SACROSPINOUS FIXATION;  Surgeon: Jaquita Folds, MD;  Location: [facility];  Service: Gynecology;  Laterality: N/A;  total time requested is 1.5 hrs   BLADDER SUSPENSION N/A 11/10/2021   Procedure: TRANSVAGINAL TAPE (TVT) PROCEDURE;  Surgeon: Jaquita Folds, MD;  Location: [facility];  Service: Gynecology;  Laterality: N/A;   BREAST ENHANCEMENT SURGERY Bilateral    1970s   BREAST IMPLANT REMOVAL Bilateral 03/07/2019   BUNIONECTOMY Bilateral 1997   CATARACT EXTRACTION W/ INTRAOCULAR LENS IMPLANT Right 2005   COLONOSCOPY  10/2020   CYSTOSCOPY WITH FULGERATION  11/10/2021   Procedure: CYSTOSCOPY WITH FULGERATION;  Surgeon: Jaquita Folds, MD;  Location[facility];  Service: Gynecology;;   ESOPHAGOGASTRODUODENOSCOPY (EGD) WITH PROPOFOL N/A 03/12/2019   Procedure: ESOPHAGOGASTRODUODENOSCOPY (EGD) WITH PROPOFOL;  Surgeon: Carol Ada, MD;  Location: [place];  Service: Endoscopy;  Laterality: N/A;   HEMORRHOID SURGERY  1976   INGUINAL HERNIA REPAIR N/A 04/10/2021   Procedure: LAPAROSCOPIC LEFT INGUINAL HERNIA REPAIR AND RIGHT INGUINAL HERNII REPAIR WITH TAP BLOCK;  Surgeon: Michael Boston, MD;  Location: [place];  Service: General;  Laterality: N/A;   ORIF ANKLE FRACTURE Left 08/30/2017   Procedure: OPEN REDUCTION INTERNAL FIXATION (ORIF) ANKLE FRACTURE;  Surgeon: Rod Can, MD;  Location: [place];  Service: Orthopedics;  Laterality: Left;   TONSILLECTOMY     CHILD    FHx:    Reviewed / unchanged  SHx:    Reviewed / unchanged   Systems Review:  Constitutional: Denies fever, chills, wt changes, headaches, insomnia, fatigue, night sweats, change in appetite. Eyes: Denies redness, blurred vision, diplopia, discharge, itchy, watery eyes.  ENT: Denies discharge, congestion, post nasal drip,  epistaxis, sore throat, earache, hearing loss, dental pain, tinnitus, vertigo, sinus pain, snoring.  CV: Denies chest pain, palpitations, irregular heartbeat, syncope, dyspnea, diaphoresis, orthopnea, PND, claudication or edema. Respiratory: denies cough, dyspnea, DOE, pleurisy, hoarseness, laryngitis, wheezing.  Gastrointestinal: Denies dysphagia, odynophagia, heartburn, reflux, water brash, abdominal pain or cramps, nausea, vomiting, bloating, diarrhea, constipation, hematemesis, melena, hematochezia  or hemorrhoids. Genitourinary: Denies dysuria, frequency, urgency, nocturia, hesitancy, discharge, hematuria or flank pain. Musculoskeletal: Denies arthralgias, myalgias, stiffness, jt. swelling, pain, limping or strain/sprain.  Skin: Denies pruritus, rash, hives, warts, acne, eczema or change in skin lesion(s). Neuro: No weakness, tremor, incoordination, spasms, paresthesia or pain. Psychiatric: Denies confusion, memory loss or sensory loss. Endo: Denies change in weight, skin or hair change.  Heme/Lymph: No excessive bleeding, bruising or enlarged lymph nodes.  Physical Exam  BP 120/80   Pulse (!) 46   Temp 98 F (36.7 C)   Resp 16   Ht '5\' 3"'$  (1.6 m)   Wt 167 lb 12.8 oz (76.1 kg)   SpO2 96%   BMI 29.72 kg/m   Appears  well nourished, well groomed  and in no distress.  Eyes: PERRLA, EOMs, conjunctiva no swelling or erythema. Sinuses: No frontal/maxillary tenderness ENT/Mouth: EAC's clear, TM's nl w/o erythema, bulging. Nares clear w/o erythema, swelling, exudates. Oropharynx clear without erythema or exudates. Oral hygiene is good. Tongue normal, non obstructing. Hearing intact.  Neck: Supple. Thyroid not palpable. Car 2+/2+ without bruits, nodes or JVD. Chest: Respirations nl with BS clear & equal w/o rales, rhonchi, wheezing or stridor.  Cor: Heart sounds normal w/ regular rate and rhythm without sig. murmurs, gallops, clicks or rubs. Peripheral pulses normal and equal  without  edema.  Abdomen: Soft & bowel sounds normal. Non-tender w/o guarding, rebound, hernias, masses or organomegaly.  Lymphatics: Unremarkable.  Musculoskeletal: Full ROM all peripheral extremities, joint stability, 5/5 strength and normal gait.  Skin: Warm, dry without exposed rashes, lesions or ecchymosis apparent.  Neuro: Cranial nerves intact, reflexes equal bilaterally. Sensory-motor testing grossly intact. Tendon reflexes grossly intact.  Pysch: Alert & oriented x 3.  Insight and judgement nl & appropriate. No ideations.  Assessment and Plan:  1. Labile hypertension  - Continue medication, monitor blood pressure at home.  - Continue DASH diet.  Reminder to go to the ER if any CP,  SOB, nausea, dizziness, severe HA, changes vision/speech.   - CBC with Differential/Platelet - COMPLETE METABOLIC PANEL WITH GFR - Magnesium - TSH  2. Hyperlipidemia, mixed  - Continue diet/meds, exercise,& lifestyle modifications.  - Continue monitor periodic cholesterol/liver & renal functions    - Lipid panel - TSH  3. Abnormal glucose  - Continue diet, exercise  - Lifestyle modifications.  - Monitor appropriate labs    - Hemoglobin A1c - Insulin, random  4. Vitamin D deficiency  - Continue supplementation    - VITAMIN D 25 Hydroxy   5. Hypothyroidism, unspecified type  - TSH  6. Atherosclerosis of aorta (HCC) mnby Chest CT scan in 2020   - Lipid panel  7. Medication management  - CBC with Differential/Platelet - COMPLETE METABOLIC PANEL WITH GFR - Magnesium - Lipid panel - TSH - Hemoglobin A1c - Insulin, random - VITAMIN D 25 Hydroxy  8. Need for immunization against influenza  - Flu vaccine HIGH DOSE PF (Fluzone High dose)         Discussed  regular exercise, BP monitoring, weight control to achieve/maintain BMI less than 25 and discussed med and SE's. Recommended labs to assess /monitor clinical status .  I discussed the assessment and treatment plan with the  patient. The patient was provided an opportunity to ask questions and all were answered. The patient agreed with the plan and demonstrated an understanding of the instructions.  I provided over 30 minutes of exam, counseling, chart review and  complex critical decision making.        The patient was advised to call back or seek an in-person evaluation if the symptoms worsen or if the condition fails to improve as anticipated.   Kirtland Bouchard, MD

## 2022-02-16 NOTE — Patient Instructions (Signed)

## 2022-02-17 ENCOUNTER — Encounter: Payer: Self-pay | Admitting: Internal Medicine

## 2022-02-17 ENCOUNTER — Ambulatory Visit (INDEPENDENT_AMBULATORY_CARE_PROVIDER_SITE_OTHER): Payer: PPO | Admitting: Internal Medicine

## 2022-02-17 VITALS — BP 120/80 | HR 46 | Temp 98.0°F | Resp 16 | Ht 63.0 in | Wt 167.8 lb

## 2022-02-17 DIAGNOSIS — I700 Atherosclerosis of aorta: Secondary | ICD-10-CM | POA: Diagnosis not present

## 2022-02-17 DIAGNOSIS — Z23 Encounter for immunization: Secondary | ICD-10-CM | POA: Diagnosis not present

## 2022-02-17 DIAGNOSIS — Z79899 Other long term (current) drug therapy: Secondary | ICD-10-CM | POA: Diagnosis not present

## 2022-02-17 DIAGNOSIS — E782 Mixed hyperlipidemia: Secondary | ICD-10-CM

## 2022-02-17 DIAGNOSIS — E559 Vitamin D deficiency, unspecified: Secondary | ICD-10-CM

## 2022-02-17 DIAGNOSIS — R7309 Other abnormal glucose: Secondary | ICD-10-CM

## 2022-02-17 DIAGNOSIS — R0989 Other specified symptoms and signs involving the circulatory and respiratory systems: Secondary | ICD-10-CM

## 2022-02-17 DIAGNOSIS — E039 Hypothyroidism, unspecified: Secondary | ICD-10-CM

## 2022-02-18 ENCOUNTER — Other Ambulatory Visit: Payer: Self-pay | Admitting: Internal Medicine

## 2022-02-18 DIAGNOSIS — E039 Hypothyroidism, unspecified: Secondary | ICD-10-CM

## 2022-02-18 DIAGNOSIS — N179 Acute kidney failure, unspecified: Secondary | ICD-10-CM

## 2022-02-18 LAB — LIPID PANEL
Cholesterol: 176 mg/dL (ref <200)
HDL: 71 mg/dL (ref >=50)
LDL Cholesterol (Calc): 87 mg/dL (calc)
Non-HDL Cholesterol (Calc): 105 mg/dL (calc) (ref <130)
Total CHOL/HDL Ratio: 2.5 (calc) (ref <5.0)
Triglycerides: 85 mg/dL (ref <150)

## 2022-02-18 LAB — CBC WITH DIFFERENTIAL/PLATELET
Absolute Monocytes: 329 cells/uL (ref 200–950)
Basophils Absolute: 38 cells/uL (ref 0–200)
Basophils Relative: 0.7 %
Eosinophils Absolute: 448 cells/uL (ref 15–500)
Eosinophils Relative: 8.3 %
HCT: 39.9 % (ref 35.0–45.0)
Hemoglobin: 13 g/dL (ref 11.7–15.5)
Lymphs Abs: 1150 cells/uL (ref 850–3900)
MCH: 30 pg (ref 27.0–33.0)
MCHC: 32.6 g/dL (ref 32.0–36.0)
MCV: 92.1 fL (ref 80.0–100.0)
MPV: 10.5 fL (ref 7.5–12.5)
Monocytes Relative: 6.1 %
Neutro Abs: 3434 cells/uL (ref 1500–7800)
Neutrophils Relative %: 63.6 %
Platelets: 313 10*3/uL (ref 140–400)
RBC: 4.33 10*6/uL (ref 3.80–5.10)
RDW: 13 % (ref 11.0–15.0)
Total Lymphocyte: 21.3 %
WBC: 5.4 10*3/uL (ref 3.8–10.8)

## 2022-02-18 LAB — COMPLETE METABOLIC PANEL WITH GFR
AG Ratio: 1.4 (calc) (ref 1.0–2.5)
ALT: 10 U/L (ref 6–29)
AST: 16 U/L (ref 10–35)
Albumin: 4 g/dL (ref 3.6–5.1)
Alkaline phosphatase (APISO): 71 U/L (ref 37–153)
BUN/Creatinine Ratio: 18 (calc) (ref 6–22)
BUN: 26 mg/dL — ABNORMAL HIGH (ref 7–25)
CO2: 28 mmol/L (ref 20–32)
Calcium: 9.6 mg/dL (ref 8.6–10.4)
Chloride: 103 mmol/L (ref 98–110)
Creat: 1.46 mg/dL — ABNORMAL HIGH (ref 0.60–1.00)
Globulin: 2.8 g/dL (calc) (ref 1.9–3.7)
Glucose, Bld: 87 mg/dL (ref 65–99)
Potassium: 4.5 mmol/L (ref 3.5–5.3)
Sodium: 139 mmol/L (ref 135–146)
Total Bilirubin: 0.5 mg/dL (ref 0.2–1.2)
Total Protein: 6.8 g/dL (ref 6.1–8.1)
eGFR: 38 mL/min/{1.73_m2} — ABNORMAL LOW (ref >=60)

## 2022-02-18 LAB — TSH: TSH: 6.68 mIU/L — ABNORMAL HIGH (ref 0.40–4.50)

## 2022-02-18 LAB — HEMOGLOBIN A1C
Hgb A1c MFr Bld: 5.6 % of total Hgb (ref <5.7)
Mean Plasma Glucose: 114 mg/dL
eAG (mmol/L): 6.3 mmol/L

## 2022-02-18 LAB — VITAMIN D 25 HYDROXY (VIT D DEFICIENCY, FRACTURES): Vit D, 25-Hydroxy: 78 ng/mL (ref 30–100)

## 2022-02-18 LAB — MAGNESIUM: Magnesium: 1.9 mg/dL (ref 1.5–2.5)

## 2022-02-18 LAB — INSULIN, RANDOM: Insulin: 4.9 u[IU]/mL

## 2022-02-18 MED ORDER — LEVOTHYROXINE SODIUM 100 MCG PO TABS
ORAL_TABLET | ORAL | 1 refills | Status: DC
  Filled 2022-02-19: qty 90, 90d supply, fill #0
  Filled 2022-05-14: qty 90, 90d supply, fill #1

## 2022-02-18 NOTE — Progress Notes (Signed)
<><><><><><><><><><><><><><><><><><><><><><><><><><><><><><><><><> <><><><><><><><><><><><><><><><><><><><><><><><><><><><><><><><><> - Test results slightly outside the reference range are not unusual. If there is anything important, I will review this with you,  otherwise it is considered normal test values.  If you have further questions,  please do not hesitate to contact me at the office or via My Chart.  <><><><><><><><><><><><><><><><><><><><><><><><><><><><><><><><><> <><><><><><><><><><><><><><><><><><><><><><><><><><><><><><><><><>  - Kidney functions  look a little dehydrated                                                      - GFR has Dropped down from 56  to    38    Very important to drink adequate amounts of fluids                                                                           to prevent permanent damage    - Recommend drink at least 6 bottles (16 ounces) of fluids /water /day                                                                                                 = 96 Oz ~100 oz  - 100 oz = 3,000 cc or 3 liters / day  - >> That's 1 &1/2 bottles                                                                                  of a 2 liter soda bottle /day !   -  Please call office to schedule Nurse Visit in 1 month                                                                                  to recheck Kidney functions  <><><><><><><><><><><><><><><><><><><><><><><><><><><><><><><><><> <><><><><><><><><><><><><><><><><><><><><><><><><><><><><><><><><>  - TSH is increased means thyroid hormone is low in blood.  Currently on levothyroxine  125 mcg                      1 tab 3 x /week - MWF  & 1/2 tab 4 x/week -  TThSS  = 5 tabs  /week  Recommend    - New RX for levothyroxine 100 mcg sent to take 1 whole tablet EVERY day    Also will re-check TSH at 1 month Nurse  Visit <><><><><><><><><><><><><><><><><><><><><><><><><><><><><><><><><> <><><><><><><><><><><><><><><><><><><><><><><><><><><><><><><><><>  - Absolutely,  Please do not take any supplements with large amounts of Biotin !  <><><><><><><><><><><><><><><><><><><><><><><><><><><><><><><><><> <><><><><><><><><><><><><><><><><><><><><><><><><><><><><><><><><>  - Total Chol = 176   and  LDL Chol = 87  - Both Much Better  ! <><><><><><><><><><><><><><><><><><><><><><><><><><><><><><><><><>  - A1c = 5.6% - Normal - Great - No Diabetes  ! <><><><><><><><><><><><><><><><><><><><><><><><><><><><><><><><><>  - Vitamin D = 78 - Great - Please Keep dose same  <><><><><><><><><><><><><><><><><><><><><><><><><><><><><><><><><>  - All Else - CBC - Electrolytes - Liver  &  Magnesium     - all  Normal / OK <><><><><><><><><><><><><><><><><><><><><><><><><><><><><><><><><> <><><><><><><><><><><><><><><><><><><><><><><><><><><><><><><><><>

## 2022-02-19 ENCOUNTER — Other Ambulatory Visit (HOSPITAL_COMMUNITY): Payer: Self-pay

## 2022-02-19 ENCOUNTER — Encounter: Payer: Self-pay | Admitting: Internal Medicine

## 2022-03-09 ENCOUNTER — Ambulatory Visit: Payer: PPO | Admitting: Obstetrics and Gynecology

## 2022-03-09 ENCOUNTER — Encounter: Payer: Self-pay | Admitting: Obstetrics and Gynecology

## 2022-03-09 VITALS — BP 111/67 | HR 68 | Ht 63.0 in | Wt 165.0 lb

## 2022-03-09 DIAGNOSIS — R3915 Urgency of urination: Secondary | ICD-10-CM

## 2022-03-09 DIAGNOSIS — N3281 Overactive bladder: Secondary | ICD-10-CM | POA: Diagnosis not present

## 2022-03-09 NOTE — Progress Notes (Signed)
[facility] Return Visit  SUBJECTIVE  History of Present Illness: [name] is a 71 y.o. female seen in follow-up for bladder pain and incontinence.   Surgery: s/p Posterior repair, sacrospinous ligament fixation, midurethral sling, cystoscopy on 11/10/21  At last visit she was given samples of Gemtesa which she reports she is not taking as it was not helping at all. She had a prior authorization for the medication approved but did not fill.   States DMSO seemed to help her IC and also helped her overactive bladder. She is inquiring if this is an option now.  States if not she would like to plan for botox.    Past Medical History: Patient  has a past medical history of Chronic diastolic CHF (congestive heart failure) ([employer]), Chronic kidney disease (CKD), stage III (moderate) ([place]), Depression, Diverticulosis of colon, Emphysema of lung ([place]), GERD (gastroesophageal reflux disease), History of acute pyelonephritis, History of Helicobacter pylori infection (2012), History of recurrent UTIs, History of TIA (transient ischemic attack), Hyperlipidemia, mixed, Hypertension, Hypothyroidism, IBS (irritable bowel syndrome), IC (interstitial cystitis), Inguinal hernia, bilateral, Irritable bowel syndrome with constipation and diarrhea, LBBB (left bundle branch block) (2015), OCD (obsessive compulsive disorder), OSA (obstructive sleep apnea), Osteopenia, Prolapsed hemorrhoids, Seasonal allergies, and Vitamin D deficiency.   Past Surgical History: She  has a past surgical history that includes Tonsillectomy; Bunionectomy (Bilateral, 1997); Hemorrhoid surgery (1976); Breast enhancement surgery (Bilateral); ORIF ankle fracture (Left, 08/30/2017); Breast implant removal (Bilateral, 03/07/2019); Esophagogastroduodenoscopy (egd) with propofol (N/A, 03/12/2019); Colonoscopy (10/2020); Cataract extraction w/ intraocular lens implant (Right, 2005); Inguinal hernia repair (N/A, 04/10/2021); Anterior  and posterior repair with sacrospinous fixation (N/A, 11/10/2021); Bladder suspension (N/A, 11/10/2021); and Cystoscopy with fulgeration (11/10/2021).   Medications: She has a current medication list which includes the following prescription(s): alprazolam, aspirin ec, bupropion, cetirizine, vitamin d-3, cinnamon, diphenhydramine, estradiol, famotidine, guaifenesin, hyoscyamine, ipratropium-albuterol, levothyroxine, magnesium, ondansetron, pantoprazole, rosuvastatin, and sertraline.   Allergies: Patient is allergic to ace inhibitors, cephalexin, dilaudid [hydromorphone hcl], robaxin [methocarbamol], macrobid [nitrofurantoin], levaquin [levofloxacin], neurontin [gabapentin], codeine, latex, and sulfa antibiotics.   Social History: Patient  reports that she quit smoking about 19 years ago. Her smoking use included cigarettes. She has a 40.00 pack-year smoking history. She has never used smokeless tobacco. She reports current alcohol use. She reports that she does not use drugs.      OBJECTIVE     Physical Exam: Vitals:   03/09/22 0931  BP: 111/67  Pulse: 68  Weight: 165 lb (74.8 kg)  Height: '5\' 3"'$  (1.6 m)    Gen: No apparent distress, A&O x 3.  Detailed Urogynecologic Evaluation:  Deferred.    ASSESSMENT AND PLAN    Ms. Molla is a 71 y.o. with:  1. Overactive bladder     Plan for bladder botox. Discussed it can take about 2 weeks following the installation to see results.   Also discussed needing self catheterization teaching prior to the bladder botox. 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   Previous Medications: Gemtesa Has previously tried Myrbetriq   Follow up as needed and procedure/self-cath teaching will be scheduled   Berton Mount, NP

## 2022-03-24 ENCOUNTER — Ambulatory Visit (INDEPENDENT_AMBULATORY_CARE_PROVIDER_SITE_OTHER): Payer: PPO

## 2022-03-24 DIAGNOSIS — N179 Acute kidney failure, unspecified: Secondary | ICD-10-CM

## 2022-03-24 DIAGNOSIS — E039 Hypothyroidism, unspecified: Secondary | ICD-10-CM

## 2022-03-24 NOTE — Progress Notes (Signed)
Patient presents to the office for a nurse visit to have TSH levels and kidney function rechecked. Patient states that she is currently taking Levothyroxine 150mg, one tablet daily. I questioned her about her water intake and she states that she currently drinks one gallon of green tea every 2-3 days.

## 2022-03-25 LAB — COMPLETE METABOLIC PANEL WITH GFR
AG Ratio: 1.2 (calc) (ref 1.0–2.5)
ALT: 16 U/L (ref 6–29)
AST: 19 U/L (ref 10–35)
Albumin: 3.9 g/dL (ref 3.6–5.1)
Alkaline phosphatase (APISO): 75 U/L (ref 37–153)
BUN/Creatinine Ratio: 20 (calc) (ref 6–22)
BUN: 27 mg/dL — ABNORMAL HIGH (ref 7–25)
CO2: 28 mmol/L (ref 20–32)
Calcium: 9.7 mg/dL (ref 8.6–10.4)
Chloride: 104 mmol/L (ref 98–110)
Creat: 1.36 mg/dL — ABNORMAL HIGH (ref 0.60–1.00)
Globulin: 3.2 g/dL (calc) (ref 1.9–3.7)
Glucose, Bld: 88 mg/dL (ref 65–99)
Potassium: 4.5 mmol/L (ref 3.5–5.3)
Sodium: 141 mmol/L (ref 135–146)
Total Bilirubin: 0.4 mg/dL (ref 0.2–1.2)
Total Protein: 7.1 g/dL (ref 6.1–8.1)
eGFR: 42 mL/min/{1.73_m2} — ABNORMAL LOW (ref >=60)

## 2022-03-25 LAB — TSH: TSH: 1.32 mIU/L (ref 0.40–4.50)

## 2022-03-25 NOTE — Progress Notes (Signed)
<><><><><><><><><><><><><><><><><><><><><><><><><><><><><><><><><> <><><><><><><><><><><><><><><><><><><><><><><><><><><><><><><><><> -   Test results slightly outside the reference range are not unusual. If there is anything important, I will review this with you,  otherwise it is considered normal test values.  If you have further questions,  please do not hesitate to contact me at the office or via My Chart.  <><><><><><><><><><><><><><><><><><><><><><><><><><><><><><><><><> <><><><><><><><><><><><><><><><><><><><><><><><><><><><><><><><><>   -  Thyroid is back in Normal range, So please continue dose same  <><><><><><><><><><><><><><><><><><><><><><><><><><><><><><><><><> <><><><><><><><><><><><><><><><><><><><><><><><><><><><><><><><><>  -  Kidney functions are a little better, but not back to where I'd like to see it, So                                                                                       please keep drinking lots of fluids   <><><><><><><><><><><><><><><><><><><><><><><><><><><><><><><><><> <><><><><><><><><><><><><><><><><><><><><><><><><><><><><><><><><>

## 2022-04-06 ENCOUNTER — Other Ambulatory Visit: Payer: Self-pay | Admitting: Internal Medicine

## 2022-04-06 DIAGNOSIS — I2510 Atherosclerotic heart disease of native coronary artery without angina pectoris: Secondary | ICD-10-CM

## 2022-04-06 DIAGNOSIS — E782 Mixed hyperlipidemia: Secondary | ICD-10-CM

## 2022-04-06 MED ORDER — ROSUVASTATIN CALCIUM 5 MG PO TABS: ORAL_TABLET | ORAL | 3 refills | Status: DC

## 2022-04-24 ENCOUNTER — Telehealth: Payer: Self-pay

## 2022-04-24 NOTE — Telephone Encounter (Signed)
LM-04/24/22-Chart review started. Reviewing OV, Consults, Hospital visits, Labs and medication changes.  Spoke to pt. And she has no health concerns besides she would like to know alternative medication for her bladder spasms. Pt. Has tried Hyoscyamine and Myrbetriq w/ no relief. Pt. Aware CP visit has been canceled for 04/30/22.  Pt. Med gaps- Rosuvastatin '5mg'$  / 84 & 90DS / 10/15/21 & 01/04/22-Still taking, denies gaps Bupropion '150mg'$  / 90DS / 10/03/20 & 06/30/21-Pt. stopped taking,  Estradiol 0.01% / 30DS / 07/24/19 & 08/20/19-Still taking, denies gaps Famotidine '20mg'$  / 90DS / 02/11/21 & 05/29/21-Still taking , denies gaps Pantoprazole '40mg'$  / 90DS / 08/22/21 & 11/21/21-Still taking, denies gaps (25 min.)

## 2022-04-28 ENCOUNTER — Telehealth: Payer: PPO | Admitting: Pharmacy Technician

## 2022-05-14 ENCOUNTER — Other Ambulatory Visit (HOSPITAL_COMMUNITY): Payer: Self-pay

## 2022-06-02 ENCOUNTER — Other Ambulatory Visit (HOSPITAL_COMMUNITY): Payer: Self-pay

## 2022-06-02 ENCOUNTER — Other Ambulatory Visit: Payer: Self-pay | Admitting: Internal Medicine

## 2022-06-02 DIAGNOSIS — F32A Depression, unspecified: Secondary | ICD-10-CM

## 2022-06-02 DIAGNOSIS — F988 Other specified behavioral and emotional disorders with onset usually occurring in childhood and adolescence: Secondary | ICD-10-CM

## 2022-06-02 MED ORDER — BUPROPION HCL ER (XL) 150 MG PO TB24
450.0000 mg | ORAL_TABLET | Freq: Every day | ORAL | 3 refills | Status: DC
  Filled 2022-06-02: qty 270, 90d supply, fill #0
  Filled 2022-09-29 – 2022-10-01 (×2): qty 270, 90d supply, fill #1
  Filled 2023-03-04: qty 270, 90d supply, fill #2

## 2022-06-03 ENCOUNTER — Other Ambulatory Visit: Payer: Self-pay

## 2022-06-16 ENCOUNTER — Ambulatory Visit (INDEPENDENT_AMBULATORY_CARE_PROVIDER_SITE_OTHER): Payer: PPO | Admitting: Nurse Practitioner

## 2022-06-16 ENCOUNTER — Encounter: Payer: Self-pay | Admitting: Nurse Practitioner

## 2022-06-16 VITALS — BP 110/60 | HR 73 | Temp 97.7°F | Ht 63.0 in | Wt 162.4 lb

## 2022-06-16 DIAGNOSIS — R0989 Other specified symptoms and signs involving the circulatory and respiratory systems: Secondary | ICD-10-CM | POA: Diagnosis not present

## 2022-06-16 DIAGNOSIS — Z0001 Encounter for general adult medical examination with abnormal findings: Secondary | ICD-10-CM | POA: Diagnosis not present

## 2022-06-16 DIAGNOSIS — I5032 Chronic diastolic (congestive) heart failure: Secondary | ICD-10-CM

## 2022-06-16 DIAGNOSIS — E039 Hypothyroidism, unspecified: Secondary | ICD-10-CM

## 2022-06-16 DIAGNOSIS — Z0000 Encounter for general adult medical examination without abnormal findings: Secondary | ICD-10-CM

## 2022-06-16 DIAGNOSIS — E559 Vitamin D deficiency, unspecified: Secondary | ICD-10-CM

## 2022-06-16 DIAGNOSIS — Z79899 Other long term (current) drug therapy: Secondary | ICD-10-CM

## 2022-06-16 DIAGNOSIS — R7309 Other abnormal glucose: Secondary | ICD-10-CM | POA: Diagnosis not present

## 2022-06-16 DIAGNOSIS — F429 Obsessive-compulsive disorder, unspecified: Secondary | ICD-10-CM

## 2022-06-16 DIAGNOSIS — Z87891 Personal history of nicotine dependence: Secondary | ICD-10-CM

## 2022-06-16 DIAGNOSIS — E782 Mixed hyperlipidemia: Secondary | ICD-10-CM | POA: Diagnosis not present

## 2022-06-16 DIAGNOSIS — I2510 Atherosclerotic heart disease of native coronary artery without angina pectoris: Secondary | ICD-10-CM

## 2022-06-16 DIAGNOSIS — K582 Mixed irritable bowel syndrome: Secondary | ICD-10-CM

## 2022-06-16 DIAGNOSIS — Z136 Encounter for screening for cardiovascular disorders: Secondary | ICD-10-CM

## 2022-06-16 DIAGNOSIS — F3342 Major depressive disorder, recurrent, in full remission: Secondary | ICD-10-CM

## 2022-06-16 DIAGNOSIS — M6289 Other specified disorders of muscle: Secondary | ICD-10-CM

## 2022-06-16 DIAGNOSIS — N1831 Chronic kidney disease, stage 3a: Secondary | ICD-10-CM | POA: Diagnosis not present

## 2022-06-16 DIAGNOSIS — N3010 Interstitial cystitis (chronic) without hematuria: Secondary | ICD-10-CM

## 2022-06-16 DIAGNOSIS — I447 Left bundle-branch block, unspecified: Secondary | ICD-10-CM

## 2022-06-16 DIAGNOSIS — K580 Irritable bowel syndrome with diarrhea: Secondary | ICD-10-CM

## 2022-06-16 DIAGNOSIS — M8580 Other specified disorders of bone density and structure, unspecified site: Secondary | ICD-10-CM

## 2022-06-16 DIAGNOSIS — J432 Centrilobular emphysema: Secondary | ICD-10-CM

## 2022-06-16 DIAGNOSIS — G43909 Migraine, unspecified, not intractable, without status migrainosus: Secondary | ICD-10-CM

## 2022-06-16 DIAGNOSIS — Z86010 Personal history of colonic polyps: Secondary | ICD-10-CM

## 2022-06-16 DIAGNOSIS — K219 Gastro-esophageal reflux disease without esophagitis: Secondary | ICD-10-CM

## 2022-06-16 DIAGNOSIS — K5730 Diverticulosis of large intestine without perforation or abscess without bleeding: Secondary | ICD-10-CM

## 2022-06-16 NOTE — Patient Instructions (Signed)

## 2022-06-16 NOTE — Progress Notes (Signed)
CPE  Assessment:   Diagnoses and all orders for this visit:  Encounter for Annual Physical Exam with abnormal findings Due annually  Health Maintenance reviewed Healthy lifestyle reviewed and goals set  Atherosclerosis of aorta (HCC)/Hyperlipidemia Per CT 2020 Continue asa, rosuvastatin Keep BG well controlled Discussed lifestyle modifications. Recommended diet heavy in fruits and veggies, omega 3's. Decrease consumption of animal meats, cheeses, and dairy products. Remain active and exercise as tolerated. Continue to monitor. Check lipids/TSH  Centrilobular emphysema ([place]) Per CT 2020, denies sx, monitor  Continue duoneb Ipratropium/Albuterol  Essential hypertension Controlled by lifestyle Discussed DASH (Dietary Approaches to Stop Hypertension) DASH diet is lower in sodium than a typical American diet. Cut back on foods that are high in saturated fat, cholesterol, and trans fats. Eat more whole-grain foods, fish, poultry, and nuts Remain active and exercise as tolerated daily.  Monitor BP at home-Call if greater than 130/80.  Check CMP/CBC  LBBB (left bundle branch block) Continue to monitor Follows with Dr. Acie Fredrickson PRN   Migraine without status migrainosus, not intractable, unspecified migraine type Resolved - reports no hx of migraine  Irritable bowel syndrome, unspecified type Stable Diet/lifestyle modification Continue monitoring   Hypothyroidism, unspecified type Controlled. Continue Levothyroxine. Reminded to take on an empty stomach 30-72mns before food.  Stop any Biotin Supplement 48-72 hours before next TSH level to reduce the risk of falsely low TSH levels. Continue to monitor.    CKD Stage 3 ([place] Discussed how what you eat and drink can aide in kidney protection. Stay well hydrated. Avoid high salt foods. Avoid NSAIDS. Keep BP and BG well controlled.   Take medications as prescribed. Remain active and exercise as tolerated daily. Maintain  weight.  Continue to monitor. Check CMP/GFR/Microablumin  Major depression, recurrent, full remission ([place] Managed by Wellbutrin and Sertraline Continue medications; limit benzo use to PRN Lifestyle discussed: diet/exerise, sleep hygiene, stress management, hydration  Obsessive-compulsive disorder, unspecified/Hx of colon polyps Continue Wellbutrin and Sertraline  Diverticulosis of intestine without bleeding, unspecified intestinal tract location Increase fiber intake Stay well hydrated Colonoscopy UTD = Next due 10/2025 High fiber diet, low red meat/processed meat  Medication management All medications discussed and reviewed in full. All questions and concerns regarding medications addressed.    Other abnormal glucose Recent A1c at goal. Continue cinnamon  Education: Reviewed 'ABCs' of diabetes management  Discussed goals to be met and/or maintained include A1C (<7) Blood pressure (<130/80) Cholesterol (LDL <70) Continue Eye Exam yearly  Continue Dental Exam Q6 mo Discussed dietary recommendations Discussed Physical Activity recommendations Check A1C   Vitamin D deficiency At goal at recent check; continue to recommend supplementation for goal of 60-100 Check vitamin D level annually and after dose change  Osteopenia DEXA UTD T Score -2.3 Pursue a combination of weight-bearing exercises and strength training. Advised on fall prevention measures including proper lighting in all rooms, removal of area rugs and floor clutter, use of walking devices as deemed appropriate, avoidance of uneven walking surfaces. Smoking cessation and moderate alcohol consumption if applicable Consume [id number] 1000 IU of vitamin D daily with a goal vitamin D serum value of 30 ng/mL or higher. Aim for 1000 to 1200 mg of elemental calcium daily through supplements and/or dietary sources.   CHF, diastolic ([place] Per ECHO while admitted 02/2019 No hx prior, denies notable sx since that  time Dr. NJohnsie Cancelrecommended no dedicated follow up unless new sx Limit sodium; monitor weight and control BP Appears euvolemic Continue to monitor  Former smoker,  40 pack year, quit 2004 CT chest 08/2020 was benign, no further per radiology  Low threshold for CXR with sx, denies today   IC/pelvic floor dysfunction Continue to follow with Uro/GYN Continue Estridiaol  IBS-M Continue Hyoscyamine/Miralax Continue Probiotic Discussed stress management Stay well hydrated  GERD Continue Pantoprazole No suspected reflux complications ([place]/stricture). Lifestyle modification:  wt loss, avoid meals 2-3h before bedtime. Consider eliminating food triggers:  chocolate, caffeine, EtOH, acid/spicy food.  Orders Placed This Encounter  Procedures  . CBC with Differential/Platelet  . COMPLETE METABOLIC PANEL WITH GFR  . Magnesium  . Lipid panel  . TSH  . Hemoglobin A1c  . Insulin, random  . VITAMIN D 25 Hydroxy (Vit-D Deficiency, Fractures)  . Urinalysis, Routine w reflex microscopic  . Microalbumin / creatinine urine ratio  . EKG 12-Lead   Notify office for further evaluation and treatment, questions or concerns if any reported s/s fail to improve.   The patient was advised to call back or seek an in-person evaluation if any symptoms worsen or if the condition fails to improve as anticipated.   Further disposition pending results of labs. Discussed med's effects and SE's.    I discussed the assessment and treatment plan with the patient. The patient was provided an opportunity to ask questions and all were answered. The patient agreed with the plan and demonstrated an understanding of the instructions.  Discussed med's effects and SE's. Screening labs and tests as requested with regular follow-up as recommended.  I provided 35 minutes of face-to-face time during this encounter including counseling, chart review, and critical decision making was preformed.  Future Appointments   Date Time Provider [place]  09/15/2022 11:00 AM Darrol Jump, NP [facility] None  06/17/2023 10:00 AM Darrol Jump, NP [facility] None    Subjective:  [name] is a 72 y.o. female who presents for annual CPE. She has Hypothyroidism; Major depression, recurrent, full remission ([place]); OCD (obsessive compulsive disorder); Abnormal glucose; Migraines; Vitamin D deficiency; LBBB (left bundle branch block); HTN (hypertension); Hyperlipidemia, mixed; Medication management; CKD Stage 3 ([place]); IBS ; History of colon polyps; Osteopenia; Chronic diastolic CHF (congestive heart failure) ([place]); Atherosclerosis of aorta ([place]) by Chest CT scan in 2020 ; Emphysema lung ([place]) - per CT 03/09/2019; Former smoker (40 pack year, quit 2004); Overweight (BMI 25.0-29.9); Interstitial cystitis; and Coronary atherosclerosis of native coronary artery (per CTA 01/31/2021)  on their problem list.  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 States ex-husband (father of children) passed unexpectedly in 04/2022.  This has affected her mood, however, she is continuing Wellbutrin and Sertraline.    Shehas had bil inguinal hernia repair by Dr. Johney Maine 04/10/2021, doing well with no issues.   She has IC, and pelvic floor dysfunction, limited benefit with pelvic floor PT.  Last seen by urogyn Dr. Hilary Hertz, 03/09/22.  She is s/p posterior repair, sacrospinous ligament fixation, midurethral sling, cystocopy on 11/10/2021.  Was provided sampes of Gem[place]a whic did not help. She has also tried [employer].  Stated that DMSO seemed to help her IC and also helped her overactive bladder.  She was acked to return for self catheterization teaching p/t bladder botox.    She is interested as well in Botox. has been doing pelvic floor PT. Has OV planned with uro/GYN, limited benefit with pelvic floor PT.  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   she has a diagnosis of depression/anxiety/OCD and is currently on wellbutrin, sertraline and xanax 0.5 mg TID PRN, reports symptoms are well controlled on current regimen. she currently reports using xanax very rarely.  BMI is Body mass index is 28.77 kg/m., she has been working on diet. Wt Readings from Last 3 Encounters:  06/16/22 162 lb 6.4 oz (73.7 kg)  03/24/22 164 lb (74.4 kg)  03/09/22 165 lb (74.8 kg)   She has aortic atherosclerosis and centrilobular emphysema per CT chest 02/2019, former smoker, quit in 2004, 40 pack year history. Inhalers only need when ill. 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   In November 2020,  patient had removal if breast implants and had post-op complications post intubation neck & facial swelling with fever & drop in O2 to 79% and all attributed to secondary to cricopharyngeal inflammation.  At this time she underwent Echo 03/10/2019 LVEF estimated 55-60%, Abnormal (paradoxical) septal motion, consistent with left bundle branch block (long history of this on EKG), Grade I diastolic dysfunctoin, she also had some elevated troponins felt to be demand related. Had follow up with Dr. Johnsie Cancel, was not recommended dedicated follow up. LBBB followed by Dr. Johnsie Cancel.    Her blood pressure has been controlled at home, today their BP is BP: 110/60 She does not workout but is doing lots of intense housework. She denies chest pain, shortness of breath, dizziness.   She is not on cholesterol medication, mild elevations treated by lifestyle modification. Declines medication. Her cholesterol is not at goal. The cholesterol last visit was:   Lab Results  Component Value Date   CHOL 176 02/17/2022   HDL 71 02/17/2022   LDLCALC 87 02/17/2022   TRIG 85 02/17/2022   CHOLHDL 2.5 02/17/2022    She has been working on diet and exercise for glucose management, and denies  increased appetite, nausea, paresthesia of the feet, polydipsia, polyuria, visual disturbances and vomiting. Last A1C in the office was:  Lab Results  Component Value Date   HGBA1C 5.6 02/17/2022   She is on thyroid medication. Her medication was not changed last visit, taking 125 mcg daily Lab Results  Component Value Date   TSH 1.32 03/24/2022   She is followed by nephrology Dr. Katrine Coho for CKD III Lab Results  Component Value Date   [id number] (L) 04/07/2021   Patient is on Vitamin D supplement and at goal at recent check:  Lab Results  Component Value Date   VD25OH 78 02/17/2022     Stopped B complex supplement last week Lab Results  Component Value Date   VITAMINB12 1,001 06/13/2021   Medication Review: Current Outpatient Medications on File Prior to Visit  Medication Sig Dispense Refill  . ALPRAZolam (XANAX) 0.5 MG tablet TAKE 1/2 TO 1 TABLET ONCE A DAY IF NEEDED FOR SEVERE ANXIETY OR SLEEP. **Use sparingly to prevent addiction and tolerance. 30 tablet 0  . aspirin EC 81 MG tablet Take 81 mg by mouth daily.     Marland Kitchen buPROPion (WELLBUTRIN XL) 150 MG 24 hr tablet Take 3 tablets (450 mg total) by mouth daily for depression. 270 tablet 3  . cetirizine (ZYRTEC) 10 MG tablet Take 10 mg by mouth daily.    . Cholecalciferol (VITAMIN D-3) 5000 UNITS TABS Take 5,000 Units by mouth daily.     . Cinnamon 500 MG capsule Take 500 mg  by mouth daily.    . diphenhydrAMINE (BENADRYL) 25 MG tablet Take 25 mg by mouth every 6 (six) hours as needed.    Marland Kitchen estradiol (ESTRACE) 0.1 MG/GM vaginal cream Place 0.5 g vaginally 2 (two) times a week. Place 0.5g nightly for two weeks then twice a week after 30 g 11  . famotidine (PEPCID) 20 MG tablet TAKE 1 TABLET BY MOUTH TWICE DAILY FOR INDIGESTION AND HEARTBURN (Patient taking differently: Take 40 mg by mouth 2 (two) times daily.) 180 tablet 3  . guaifenesin (HUMIBID E) 400 MG TABS tablet Take 400 mg by mouth daily.    . hyoscyamine (LEVSIN  SL) 0.125 MG SL tablet DISSOLVE ONE TABLET UNDER THE TONGUE EVERY 4 HOURS IF NEEDED FOR NAUSEA, BLOATING, CRAMPING, OR DIARRHEA 120 tablet 3  . ipratropium-albuterol (DUONEB) 0.5-2.5 (3) MG/3ML SOLN USE 1 VIAL IN NEBULIZER EVERY 4 HOURS AS NEEDED **MAX 6 DOSES A DAY** 540 mL 0  . levothyroxine (SYNTHROID) 100 MCG tablet Take  1 tablet by mouth once daily on an empty stomach with only water for 30 minutes. Do not take Antacid meds, Calcium or Magnesium for 4 hours & avoid Biotin. 90 tablet 1  . Magnesium 250 MG TABS Take 1 capsule by mouth daily.    . ondansetron (ZOFRAN) 8 MG tablet TAKE 1 TABLET BY MOUTH 3 TIMES A DAY AS NEEDED FOR NAUSEA (Patient taking differently: Take 8 mg by mouth every 8 (eight) hours as needed. TAKE 1 TABLET BY MOUTH 3 TIMES A DAY AS NEEDED FOR NAUSEA) 60 tablet 2  . pantoprazole (PROTONIX) 20 MG tablet Take 1 tablet (20 mg total) by mouth daily. 90 tablet 2  . rosuvastatin (CRESTOR) 5 MG tablet Take  1 tablet 3 x /week for Cholesterol                                                         /                    TAKE                                     BY                                  MOUTH 39 tablet 3  . sertraline (ZOLOFT) 100 MG tablet Take 1 tablet by mouth daily for mood 90 tablet 3  . buPROPion (WELLBUTRIN XL) 150 MG 24 hr tablet Take 1 tablet (150 mg total) by mouth daily. (Patient not taking: Reported on 06/16/2022) 90 tablet 3   No current facility-administered medications on file prior to visit.    Allergies  Allergen Reactions  . Ace Inhibitors Other (See Comments)    Acute renal failure (lisinopril)  . Cephalexin Anaphylaxis  . Dilaudid [Hydromorphone Hcl] Anaphylaxis  . Robaxin [Methocarbamol] Anaphylaxis  . Macrobid [Nitrofurantoin] Other (See Comments)    Causes hyperthermia   . Levaquin [Levofloxacin] Nausea And Vomiting  . Neurontin [Gabapentin]     Confusion   . Codeine Nausea Only  . Latex Rash  . Sulfa Antibiotics Other (See Comments)    Do  not take  regularly per nephrology.  Can use for acute issues.     Current Problems (verified) Patient Active Problem List   Diagnosis Date Noted  . Coronary atherosclerosis of native coronary artery (per CTA 01/31/2021)  06/14/2021  . Interstitial cystitis 06/14/2020  . Overweight (BMI 25.0-29.9) 06/13/2020  . Former smoker (40 pack year, quit 2004) 08/30/2019  . Atherosclerosis of aorta ([place]) by Chest CT scan in 2020  03/22/2019  . Emphysema lung ([place]) - per CT 03/09/2019   . Chronic diastolic CHF (congestive heart failure) ([place]) 03/11/2019  . Osteopenia 01/16/2019  . History of colon polyps 06/28/2018  . CKD Stage 3 ([place]) 01/10/2015  . IBS  01/10/2015  . Hyperlipidemia, mixed 06/12/2014  . Medication management 06/12/2014  . LBBB (left bundle branch block) 07/24/2013  . HTN (hypertension) 07/24/2013  . Major depression, recurrent, full remission ([place])   . OCD (obsessive compulsive disorder)   . Abnormal glucose   . Migraines   . Vitamin D deficiency   . Hypothyroidism 04/05/2013    Screening Tests Immunization History  Administered Date(s) Administered  . Influenza Split 01/31/2016, 05/12/2018, 03/17/2019  . Influenza, High Dose Seasonal PF 03/22/2017, 02/16/2018, 03/22/2019, 03/24/2021, 02/17/2022  . Influenza-Unspecified 01/31/2016, 02/09/2020  . PFIZER Comirnaty(Hosier Top)Covid-19 Tri-Sucrose Vaccine 03/31/2021  . PFIZER(Purple Top)SARS-COV-2 Vaccination 07/05/2019, 07/26/2019, 03/04/2020  . Pneumococcal Conjugate-13 03/22/2017, 06/29/2018  . Pneumococcal Polysaccharide-23 02/12/1999, 06/29/2018  . Td 04/27/2013  . Zoster Recombinat (Shingrix) 06/14/2020, 08/28/2020  . Zoster, Live 11/24/2005   Health Maintenance  Topic Date Due  . COVID-19 Vaccine (5 - 2023-24 season) 12/26/2021  . Medicare Annual Wellness (AWV)  10/16/2022  . DTaP/Tdap/Td (2 - Tdap) 04/28/2023  . MAMMOGRAM  11/19/2023  . DEXA SCAN  11/19/2023  . COLONOSCOPY (Pts 45-56yr Insurance coverage will  need to be confirmed)  11/14/2025  . Pneumonia Vaccine 72 Years old  Completed  . INFLUENZA VACCINE  Completed  . Hepatitis C Screening  Completed  . Zoster Vaccines- Shingrix  Completed  . HPV VACCINES  Aged Out   Preventative care: Last colonoscopy: 10/2020 Dr. SFuller Plan polyps, 5 year recall - Due2027 Last EGD: 02/2019  Last mammogram: annually at breast center 11/18/2021 Last breast MRI: 10/2018 Last pap smear/pelvic exam: 2018, s/p total hysterectomy, DONE  DEXA: 12/2018, R fem T -2.1  11/18/2021 T Score -2.3  Former smoker, 40 pack year, quit 2004 Last CT chest 09/17/2020, emphysema, aortic atherosclerosis, no nodule, no further follow up recommended  Names of Other Physician/Practitioners you currently use: 1. [facility] here for primary care 2. Dr. GDelman Cheadle eye doctor, last visit 2022  3. Dr. FWoodfin Ganja dentist, last visit 2022, wants to find a new provider who takes insurance   Patient Care Team: MUnk [name] MD as PCP - General (Internal Medicine) NJosue Hector MD as PCP - Cardiology (Cardiology) CDonato Heinz MD as Consulting Physician (Nephrology) GMelissa Noon [place] Referring Physician (Optometry) SLadene Artist MD as Consulting Physician (Gastroenterology) NJosue Hector MD as Consulting Physician (Cardiology) [facility] as Pharmacist (Pharmacist) PLandry Mellow Candace (Gynecology)  SURGICAL HISTORY She  has a past surgical history that includes Tonsillectomy; Bunionectomy (Bilateral, 1997); Hemorrhoid surgery (1976); Breast enhancement surgery (Bilateral); ORIF ankle fracture (Left, 08/30/2017); Breast implant removal (Bilateral, 03/07/2019); Esophagogastroduodenoscopy (egd) with propofol (N/A, 03/12/2019); Colonoscopy (10/2020); Cataract extraction w/ intraocular lens implant (Right, 2005); Inguinal hernia repair (N/A, 04/10/2021); Anterior and posterior repair with sacrospinous fixation (N/A, 11/10/2021);  Bladder suspension (N/A, 11/10/2021); and Cystoscopy with fulgeration (11/10/2021).  FAMILY HISTORY Her family history includes Colon polyps in her mother; Heart block in her maternal grandmother; Hypertension in her sister; Muscular dystrophy in her maternal grandmother and mother. SOCIAL HISTORY She  reports that she quit smoking about 20 years ago. Her smoking use included cigarettes. She has a 40.00 pack-year smoking history. She has never used smokeless tobacco. She reports current alcohol use. She reports that she does not use drugs.   Review of Systems  Constitutional:  Negative for malaise/fatigue and weight loss.  HENT:  Negative for hearing loss and tinnitus.   Eyes:  Negative for blurred vision and double vision.  Respiratory:  Negative for cough, shortness of breath and wheezing.   Cardiovascular:  Negative for chest pain, palpitations, orthopnea, claudication and leg swelling.  Gastrointestinal:  Positive for constipation and diarrhea. Negative for abdominal pain, blood in stool, heartburn, melena, nausea and vomiting.  Genitourinary: Negative.   Musculoskeletal:  Negative for falls, joint pain and myalgias.  Skin:  Negative for rash.  Neurological:  Negative for dizziness, tingling, sensory change, weakness and headaches.  Endo/Heme/Allergies:  Negative for polydipsia.  Psychiatric/Behavioral:  Negative for depression, memory loss, substance abuse and suicidal ideas. The patient is not nervous/anxious and does not have insomnia.   All other systems reviewed and are negative.    Objective:     Today's Vitals   06/16/22 1011  BP: 110/60  Pulse: 73  Temp: 97.7 F (36.5 C)  SpO2: 99%  Weight: 162 lb 6.4 oz (73.7 kg)  Height: 5' 3"$  (1.6 m)   Body mass index is 28.77 kg/m.  General appearance: alert, no distress, WD/WN, female HEENT: normocephalic, sclerae anicteric, TMs pearly, nares patent, no discharge or erythema, pharynx normal Oral cavity: MMM, no lesions Neck:  supple, no lymphadenopathy, no thyromegaly, no masses Heart: RRR, normal S1, S2, no murmurs Lungs: CTA bilaterally, no wheezes, rhonchi, or rales Abdomen: +bs, soft, non tender, non distended, no masses, no hepatomegaly, no splenomegaly, no hernias. Well healed lap incision sites.  Musculoskeletal: nontender, no swelling, no obvious deformity Extremities: no edema, no cyanosis, no clubbing.  Pulses: 2+ symmetric, upper and lower extremities, normal cap refill Neurological: alert, oriented x 3, CN2-12 intact, strength normal upper extremities and lower extremities, sensation normal throughout, DTRs 2+ throughout, no cerebellar signs, gait normal Psychiatric: normal affect, behavior normal, pleasant   EKG: Sinus brady, LBBB, NSCPT   Kimmy Parish, NP   06/16/2022

## 2022-06-17 ENCOUNTER — Other Ambulatory Visit: Payer: Self-pay | Admitting: Nurse Practitioner

## 2022-06-17 DIAGNOSIS — R82998 Other abnormal findings in urine: Secondary | ICD-10-CM

## 2022-06-17 LAB — URINALYSIS, ROUTINE W REFLEX MICROSCOPIC
Bilirubin Urine: NEGATIVE
Glucose, UA: NEGATIVE
Hyaline Cast: NONE SEEN /LPF
Ketones, ur: NEGATIVE
Nitrite: NEGATIVE
Specific Gravity, Urine: 1.021 (ref 1.001–1.035)
pH: 7.5 (ref 5.0–8.0)

## 2022-06-17 LAB — COMPLETE METABOLIC PANEL WITH GFR
AG Ratio: 1.3 (calc) (ref 1.0–2.5)
ALT: 11 U/L (ref 6–29)
AST: 16 U/L (ref 10–35)
Albumin: 3.8 g/dL (ref 3.6–5.1)
Alkaline phosphatase (APISO): 65 U/L (ref 37–153)
BUN/Creatinine Ratio: 16 (calc) (ref 6–22)
BUN: 17 mg/dL (ref 7–25)
CO2: 30 mmol/L (ref 20–32)
Calcium: 9.7 mg/dL (ref 8.6–10.4)
Chloride: 103 mmol/L (ref 98–110)
Creat: 1.05 mg/dL — ABNORMAL HIGH (ref 0.60–1.00)
Globulin: 2.9 g/dL (calc) (ref 1.9–3.7)
Glucose, Bld: 88 mg/dL (ref 65–99)
Potassium: 4.3 mmol/L (ref 3.5–5.3)
Sodium: 139 mmol/L (ref 135–146)
Total Bilirubin: 0.4 mg/dL (ref 0.2–1.2)
Total Protein: 6.7 g/dL (ref 6.1–8.1)
eGFR: 57 mL/min/{1.73_m2} — ABNORMAL LOW (ref >=60)

## 2022-06-17 LAB — MICROALBUMIN / CREATININE URINE RATIO
Creatinine, Urine: 143 mg/dL (ref 20–275)
Microalb Creat Ratio: 613 mcg/mg creat — ABNORMAL HIGH (ref <30)
Microalb, Ur: 87.6 mg/dL

## 2022-06-17 LAB — LIPID PANEL
Cholesterol: 164 mg/dL (ref <200)
HDL: 67 mg/dL (ref >=50)
LDL Cholesterol (Calc): 78 mg/dL (calc)
Non-HDL Cholesterol (Calc): 97 mg/dL (calc) (ref <130)
Total CHOL/HDL Ratio: 2.4 (calc) (ref <5.0)
Triglycerides: 105 mg/dL (ref <150)

## 2022-06-17 LAB — HEMOGLOBIN A1C
Hgb A1c MFr Bld: 5.7 % of total Hgb — ABNORMAL HIGH (ref <5.7)
Mean Plasma Glucose: 117 mg/dL
eAG (mmol/L): 6.5 mmol/L

## 2022-06-17 LAB — MICROSCOPIC MESSAGE

## 2022-06-17 LAB — CBC WITH DIFFERENTIAL/PLATELET
Absolute Monocytes: 277 cells/uL (ref 200–950)
Basophils Absolute: 31 cells/uL (ref 0–200)
Basophils Relative: 0.8 %
Eosinophils Absolute: 289 cells/uL (ref 15–500)
Eosinophils Relative: 7.4 %
HCT: 38.9 % (ref 35.0–45.0)
Hemoglobin: 12.9 g/dL (ref 11.7–15.5)
Lymphs Abs: 1162 cells/uL (ref 850–3900)
MCH: 29.4 pg (ref 27.0–33.0)
MCHC: 33.2 g/dL (ref 32.0–36.0)
MCV: 88.6 fL (ref 80.0–100.0)
MPV: 10.1 fL (ref 7.5–12.5)
Monocytes Relative: 7.1 %
Neutro Abs: 2141 cells/uL (ref 1500–7800)
Neutrophils Relative %: 54.9 %
Platelets: 295 10*3/uL (ref 140–400)
RBC: 4.39 10*6/uL (ref 3.80–5.10)
RDW: 13.5 % (ref 11.0–15.0)
Total Lymphocyte: 29.8 %
WBC: 3.9 10*3/uL (ref 3.8–10.8)

## 2022-06-17 LAB — INSULIN, RANDOM: Insulin: 4.9 u[IU]/mL

## 2022-06-17 LAB — VITAMIN D 25 HYDROXY (VIT D DEFICIENCY, FRACTURES): Vit D, 25-Hydroxy: 79 ng/mL (ref 30–100)

## 2022-06-17 LAB — MAGNESIUM: Magnesium: 1.9 mg/dL (ref 1.5–2.5)

## 2022-06-17 LAB — TSH: TSH: 0.49 mIU/L (ref 0.40–4.50)

## 2022-06-29 DIAGNOSIS — E039 Hypothyroidism, unspecified: Secondary | ICD-10-CM | POA: Diagnosis not present

## 2022-06-29 DIAGNOSIS — N183 Chronic kidney disease, stage 3 unspecified: Secondary | ICD-10-CM | POA: Diagnosis not present

## 2022-06-29 DIAGNOSIS — N390 Urinary tract infection, site not specified: Secondary | ICD-10-CM | POA: Diagnosis not present

## 2022-06-29 DIAGNOSIS — I129 Hypertensive chronic kidney disease with stage 1 through stage 4 chronic kidney disease, or unspecified chronic kidney disease: Secondary | ICD-10-CM | POA: Diagnosis not present

## 2022-07-15 ENCOUNTER — Ambulatory Visit: Payer: PPO

## 2022-07-15 NOTE — Progress Notes (Signed)
[name] is a 72 y.o. female here for a self-cath teaching. I demonstrated on the pt using a mirror how to use a 12 fr cath to drain urine from the bladder. Pt was able to demonstrate successfully using the catheter and verbalized understanding. Pt was given a self-cath bag with several catheters, lubricant, a mirror, measuring hat.

## 2022-07-16 ENCOUNTER — Encounter: Payer: Self-pay | Admitting: Internal Medicine

## 2022-08-11 ENCOUNTER — Other Ambulatory Visit (HOSPITAL_COMMUNITY)
Admission: RE | Admit: 2022-08-11 | Discharge: 2022-08-11 | Disposition: A | Discharge status: Home / Self Care | Payer: PPO | Source: Other Acute Inpatient Hospital | Attending: Obstetrics and Gynecology | Admitting: Obstetrics and Gynecology

## 2022-08-11 ENCOUNTER — Other Ambulatory Visit (HOSPITAL_COMMUNITY): Payer: Self-pay

## 2022-08-11 ENCOUNTER — Ambulatory Visit (INDEPENDENT_AMBULATORY_CARE_PROVIDER_SITE_OTHER): Payer: PPO | Admitting: Obstetrics and Gynecology

## 2022-08-11 ENCOUNTER — Encounter: Payer: Self-pay | Admitting: Obstetrics and Gynecology

## 2022-08-11 ENCOUNTER — Ambulatory Visit: Payer: PPO | Admitting: Obstetrics and Gynecology

## 2022-08-11 ENCOUNTER — Other Ambulatory Visit: Payer: Self-pay | Admitting: Internal Medicine

## 2022-08-11 DIAGNOSIS — R82998 Other abnormal findings in urine: Secondary | ICD-10-CM | POA: Diagnosis not present

## 2022-08-11 DIAGNOSIS — N390 Urinary tract infection, site not specified: Secondary | ICD-10-CM | POA: Diagnosis not present

## 2022-08-11 DIAGNOSIS — R350 Frequency of micturition: Secondary | ICD-10-CM

## 2022-08-11 DIAGNOSIS — E039 Hypothyroidism, unspecified: Secondary | ICD-10-CM

## 2022-08-11 LAB — URINALYSIS, COMPLETE (UACMP) WITH MICROSCOPIC
Bilirubin Urine: NEGATIVE
Glucose, UA: NEGATIVE mg/dL
Ketones, ur: NEGATIVE mg/dL
Nitrite: NEGATIVE
Protein, ur: 100 mg/dL — AB
Specific Gravity, Urine: 1.016 (ref 1.005–1.030)
WBC, UA: >50 WBC/hpf (ref 0–5)
pH: 5 (ref 5.0–8.0)

## 2022-08-11 LAB — POCT URINALYSIS DIPSTICK
Bilirubin, UA: NEGATIVE
Glucose, UA: NEGATIVE
Ketones, UA: NEGATIVE
Nitrite, UA: NEGATIVE
Protein, UA: POSITIVE — AB
Spec Grav, UA: >=1.03 — AB (ref 1.010–1.025)
Urobilinogen, UA: 0.2 E.U./dL
pH, UA: 6 (ref 5.0–8.0)

## 2022-08-11 MED ORDER — LEVOTHYROXINE SODIUM 100 MCG PO TABS
ORAL_TABLET | ORAL | 3 refills | Status: DC
Start: 2022-08-11 — End: 2023-05-05
  Filled 2022-08-11: qty 90, 90d supply, fill #0
  Filled 2022-11-12: qty 90, 90d supply, fill #1
  Filled 2023-03-04: qty 90, 90d supply, fill #2

## 2022-08-11 MED ORDER — SULFAMETHOXAZOLE-TRIMETHOPRIM 800-160 MG PO TABS: 1.0000 | ORAL_TABLET | Freq: Two times a day (BID) | ORAL | 0 refills | Status: AC

## 2022-08-11 NOTE — Progress Notes (Signed)
[facility] Return Visit  SUBJECTIVE  History of Present Illness: [name] is a 72 y.o. female seen in follow-up for OAB. She was planning for intravesical botox today but has some dysuria symptoms.    Past Medical History: Patient  has a past medical history of Chronic diastolic CHF (congestive heart failure) (HCC), Chronic kidney disease (CKD), stage III (moderate) (HCC), Depression, Diverticulosis of colon, Emphysema of lung (HCC), GERD (gastroesophageal reflux disease), History of acute pyelonephritis, History of Helicobacter pylori infection (2012), History of recurrent UTIs, History of TIA (transient ischemic attack), Hyperlipidemia, mixed, Hypertension, Hypothyroidism, IBS (irritable bowel syndrome), IC (interstitial cystitis), Inguinal hernia, bilateral, Irritable bowel syndrome with constipation and diarrhea, LBBB (left bundle branch block) (2015), OCD (obsessive compulsive disorder), OSA (obstructive sleep apnea), Osteopenia, Prolapsed hemorrhoids, Seasonal allergies, and Vitamin D deficiency.   Past Surgical History: She  has a past surgical history that includes Tonsillectomy; Bunionectomy (Bilateral, 1997); Hemorrhoid surgery (1976); Breast enhancement surgery (Bilateral); ORIF ankle fracture (Left, 08/30/2017); Breast implant removal (Bilateral, 03/07/2019); Esophagogastroduodenoscopy (egd) with propofol (N/A, 03/12/2019); Colonoscopy (10/2020); Cataract extraction w/ intraocular lens implant (Right, 2005); Inguinal hernia repair (N/A, 04/10/2021); Anterior and posterior repair with sacrospinous fixation (N/A, 11/10/2021); Bladder suspension (N/A, 11/10/2021); and Cystoscopy with fulgeration (11/10/2021).   Medications: She has a current medication list which includes the following prescription(s): sulfamethoxazole-trimethoprim, alprazolam, aspirin ec, bupropion, cetirizine, vitamin d-3, cinnamon, diphenhydramine, estradiol, famotidine, guaifenesin, hyoscyamine,  ipratropium-albuterol, levothyroxine, ondansetron, pantoprazole, rosuvastatin, and sertraline.   Allergies: Patient is allergic to ace inhibitors, cephalexin, dilaudid [hydromorphone hcl], robaxin [methocarbamol], macrobid [nitrofurantoin], levaquin [levofloxacin], neurontin [gabapentin], codeine, latex, and sulfa antibiotics.   Social History: Patient  reports that she quit smoking about 20 years ago. Her smoking use included cigarettes. She has a 40.00 pack-year smoking history. She has never used smokeless tobacco. She reports current alcohol use. She reports that she does not use drugs.      OBJECTIVE     Physical Exam: There were no vitals filed for this visit. Gen: No apparent distress, A&O x 3.     POC urine: large leukocytes, moderate blood  ASSESSMENT AND PLAN    [name] is a 72 y.o. with:  1. Acute UTI   2. Leukocytes in urine   3. Urinary frequency    - Will treat for UTI with bactrim DS BID x5 days, and will have her return for intravesical botox for OAB in a few days while on antibiotics.   Marguerita Beards, MD

## 2022-08-12 ENCOUNTER — Other Ambulatory Visit: Payer: Self-pay

## 2022-08-12 ENCOUNTER — Other Ambulatory Visit (HOSPITAL_COMMUNITY): Payer: Self-pay

## 2022-08-13 ENCOUNTER — Encounter: Payer: Self-pay | Admitting: Obstetrics and Gynecology

## 2022-08-13 ENCOUNTER — Ambulatory Visit (INDEPENDENT_AMBULATORY_CARE_PROVIDER_SITE_OTHER): Payer: PPO | Admitting: Obstetrics and Gynecology

## 2022-08-13 VITALS — BP 134/89 | HR 82

## 2022-08-13 DIAGNOSIS — R350 Frequency of micturition: Secondary | ICD-10-CM

## 2022-08-13 DIAGNOSIS — N3281 Overactive bladder: Secondary | ICD-10-CM | POA: Diagnosis not present

## 2022-08-13 DIAGNOSIS — R3989 Other symptoms and signs involving the genitourinary system: Secondary | ICD-10-CM

## 2022-08-13 LAB — POCT URINALYSIS DIPSTICK
Bilirubin, UA: NEGATIVE
Blood, UA: NEGATIVE
Glucose, UA: NEGATIVE
Ketones, UA: NEGATIVE
Nitrite, UA: NEGATIVE
Protein, UA: NEGATIVE
Spec Grav, UA: >=1.03 — AB (ref 1.010–1.025)
Urobilinogen, UA: 0.2 E.U./dL
pH, UA: 6 (ref 5.0–8.0)

## 2022-08-13 LAB — URINE CULTURE: Culture: 80000 — AB

## 2022-08-13 MED ORDER — ONABOTULINUMTOXINA 100 UNITS IJ SOLR
100.0000 [IU] | Freq: Once | INTRAMUSCULAR | Status: AC
Start: 2022-08-13 — End: 2022-08-13
  Administered 2022-08-13: 100 [IU] via INTRAMUSCULAR

## 2022-08-13 MED ORDER — LIDOCAINE HCL 2 % IJ SOLN
40.0000 mL | Freq: Once | INTRAMUSCULAR | Status: AC
Start: 2022-08-13 — End: 2022-08-13
  Administered 2022-08-13: 800 mg

## 2022-08-13 MED ORDER — LIDOCAINE HCL URETHRAL/MUCOSAL 2 % EX GEL
1.0000 | Freq: Once | CUTANEOUS | Status: AC
Start: 2022-08-13 — End: 2022-08-13
  Administered 2022-08-13: 1 via URETHRAL

## 2022-08-13 MED ORDER — LIDOCAINE-EPINEPHRINE 1 %-1:100000 IJ SOLN
50.0000 mL | Freq: Once | INTRAMUSCULAR | Status: DC
Start: 2022-08-13 — End: 2022-08-13

## 2022-08-13 NOTE — Addendum Note (Signed)
Addended by: Salina April on: 08/13/2022 09:40 AM   Modules accepted: Orders

## 2022-08-13 NOTE — Progress Notes (Signed)
Intravesical Botox Procedure:  Prior to the procedure, the patient took bactrim for antibiotic prophylaxis (has been on it for 3 days). Time out was performed. The bladder was catherized and 50 ml of 2% lidocaine was placed in the bladder and 10 ml of 2% lidocaine jelly placed in the urethra. After 30 minutes the lidocaine was drained. Cystoscopy was performed with sterile H2O and a 70 degree scope. Bladder mucosa was noted to be within normal limits. A total of 10 ml / 100 units of Botox A,  Lot # [id number] Exp 08/2024  was injected in the detrusor muscle via 5 injections of 2ml each. These were spaced about 1 cm apart. Care was taken to avoid the ureteral orifices and the trigone. Patient tolerated the procedure well. The patient was observed for 30 minutes and voided spontaneously prior to leaving.  Impression: 72 y.o. 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 Patient tolerated procedure well.  Plan: Post-procedure instructions given regarding bleeding, infection, urinary retention.  Self-catheterization teaching was previously performed.   Patient will follow up in 4 weeks All questions answered.  Marguerita Beards, MD

## 2022-08-13 NOTE — Patient Instructions (Signed)
Taking Care of Yourself after Urodynamics, Cystoscopy, Bulkamid Injection, or Botox Injection   Drink plenty of water for a day or two following your procedure. Try to have about 8 ounces (one cup) at a time, and do this 6 times or more per day unless you have fluid restrictitons AVOID irritative beverages such as coffee, tea, soda, alcoholic or citrus drinks for a day or two, as this may cause burning with urination.  For the first 1-2 days after the procedure, your urine may be pink or red in color. You may have some blood in your urine as a normal side effect of the procedure. Large amounts of bleeding or difficulty urinating are NOT normal. Call the nurse line if this happens or go to the nearest Emergency Room if the bleeding is heavy or you cannot urinate at all and it is after hours.  You may experience some discomfort or a burning sensation with urination after having this procedure. You can use over the counter Azo or pyridium to help with burning and follow the instructions on the packaging. If it does not improve within 1-2 days, or other symptoms appear (fever, chills, or difficulty urinating) call the office to speak to a nurse.  You may return to normal daily activities such as work, school, driving, exercising and housework on the day of the procedure. If your doctor gave you a prescription, take it as ordered.     

## 2022-08-17 ENCOUNTER — Other Ambulatory Visit: Payer: Self-pay | Admitting: Nurse Practitioner

## 2022-09-09 ENCOUNTER — Other Ambulatory Visit (HOSPITAL_COMMUNITY)
Admission: RE | Admit: 2022-09-09 | Discharge: 2022-09-09 | Disposition: A | Discharge status: Home / Self Care | Payer: PPO | Source: Other Acute Inpatient Hospital | Attending: Obstetrics and Gynecology | Admitting: Obstetrics and Gynecology

## 2022-09-09 ENCOUNTER — Encounter: Payer: Self-pay | Admitting: Obstetrics and Gynecology

## 2022-09-09 ENCOUNTER — Ambulatory Visit (INDEPENDENT_AMBULATORY_CARE_PROVIDER_SITE_OTHER): Payer: PPO | Admitting: Obstetrics and Gynecology

## 2022-09-09 VITALS — BP 125/81 | HR 73

## 2022-09-09 DIAGNOSIS — B379 Candidiasis, unspecified: Secondary | ICD-10-CM | POA: Diagnosis not present

## 2022-09-09 DIAGNOSIS — R350 Frequency of micturition: Secondary | ICD-10-CM

## 2022-09-09 DIAGNOSIS — R319 Hematuria, unspecified: Secondary | ICD-10-CM | POA: Diagnosis not present

## 2022-09-09 DIAGNOSIS — R82998 Other abnormal findings in urine: Secondary | ICD-10-CM | POA: Diagnosis not present

## 2022-09-09 LAB — POCT URINALYSIS DIPSTICK
Bilirubin, UA: NEGATIVE
Glucose, UA: NEGATIVE
Ketones, UA: NEGATIVE
Nitrite, UA: NEGATIVE
Protein, UA: POSITIVE — AB
Spec Grav, UA: >=1.03 — AB
Urobilinogen, UA: 0.2 U/dL
pH, UA: 6.5

## 2022-09-09 LAB — URINALYSIS, ROUTINE W REFLEX MICROSCOPIC
Bilirubin Urine: NEGATIVE
Glucose, UA: NEGATIVE mg/dL
Ketones, ur: NEGATIVE mg/dL
Nitrite: NEGATIVE
Protein, ur: >=300 mg/dL — AB
Specific Gravity, Urine: 1.023 (ref 1.005–1.030)
WBC, UA: >50 WBC/hpf (ref 0–5)
pH: 5 (ref 5.0–8.0)

## 2022-09-09 MED ORDER — CIPROFLOXACIN HCL 250 MG PO TABS: 250.0000 mg | ORAL_TABLET | Freq: Two times a day (BID) | ORAL | 0 refills | Status: AC

## 2022-09-09 MED ORDER — FLUCONAZOLE 150 MG PO TABS
150.0000 mg | ORAL_TABLET | Freq: Every day | ORAL | 0 refills | Status: AC
Start: 2022-09-09 — End: 2022-09-11

## 2022-09-09 NOTE — Progress Notes (Signed)
[facility] Return Visit  SUBJECTIVE  History of Present Illness: [name] is a 72 y.o. female seen in follow-up for bladder botox and UTI. Plan at last visit was F/u following botox. Patient reports she also has a yeast infection, reports she is not sexually active so not concerned about other infections in the vagina.      Past Medical History: Patient  has a past medical history of Chronic diastolic CHF (congestive heart failure) (HCC), Chronic kidney disease (CKD), stage III (moderate) (HCC), Depression, Diverticulosis of colon, Emphysema of lung (HCC), GERD (gastroesophageal reflux disease), History of acute pyelonephritis, History of Helicobacter pylori infection (2012), History of recurrent UTIs, History of TIA (transient ischemic attack), Hyperlipidemia, mixed, Hypertension, Hypothyroidism, IBS (irritable bowel syndrome), IC (interstitial cystitis), Inguinal hernia, bilateral, Irritable bowel syndrome with constipation and diarrhea, LBBB (left bundle branch block) (2015), OCD (obsessive compulsive disorder), OSA (obstructive sleep apnea), Osteopenia, Prolapsed hemorrhoids, Seasonal allergies, and Vitamin D deficiency.   Past Surgical History: She  has a past surgical history that includes Tonsillectomy; Bunionectomy (Bilateral, 1997); Hemorrhoid surgery (1976); Breast enhancement surgery (Bilateral); ORIF ankle fracture (Left, 08/30/2017); Breast implant removal (Bilateral, 03/07/2019); Esophagogastroduodenoscopy (egd) with propofol (N/A, 03/12/2019); Colonoscopy (10/2020); Cataract extraction w/ intraocular lens implant (Right, 2005); Inguinal hernia repair (N/A, 04/10/2021); Anterior and posterior repair with sacrospinous fixation (N/A, 11/10/2021); Bladder suspension (N/A, 11/10/2021); and Cystoscopy with fulgeration (11/10/2021).   Medications: She has a current medication list which includes the following prescription(s): ciprofloxacin, fluconazole, alprazolam, aspirin  ec, bupropion, cetirizine, vitamin d-3, cinnamon, diphenhydramine, estradiol, famotidine, guaifenesin, hyoscyamine, ipratropium-albuterol, levothyroxine, ondansetron, pantoprazole, rosuvastatin, and sertraline.   Allergies: Patient is allergic to ace inhibitors, cephalexin, dilaudid [hydromorphone hcl], robaxin [methocarbamol], macrobid [nitrofurantoin], levaquin [levofloxacin], neurontin [gabapentin], codeine, latex, and sulfa antibiotics.   Social History: Patient  reports that she quit smoking about 20 years ago. Her smoking use included cigarettes. She has a 40.00 pack-year smoking history. She has never used smokeless tobacco. She reports current alcohol use. She reports that she does not use drugs.      OBJECTIVE    POC Urine: Moderate blood, Positive protein, and large leukocytes   Physical Exam: Vitals:   09/09/22 1409  BP: 125/81  Pulse: 73   Gen: No apparent distress, A&O x 3.  Detailed Urogynecologic Evaluation:  Deferred.    ASSESSMENT AND PLAN    [name] is a 72 y.o. with:  1. Leukocytes in urine   2. Urinary frequency   3. Hematuria, unspecified type   4. Yeast infection    Leukocytes in urine today. Will treat as infection based on recent self-catheterizing and symptoms. Cipro 250 x2 daily for 5 days sent to pharmacy as empiric therapy and culture ordered.  Believed to be UTI. Will send for culture. Patient may need to be on prophylaxis. Considering Fosfamyacin once every 10 days if her nephrologist clears the plan.  Culture sent. Patient is concerned about the amount of protein in her urine and her related kidney function and requests this result be sent to her nephrologist Dr. Arrie Aran. Diflucan sent to pharmacy for patient for yeast infection.   Patient to follow up in 6 months or sooner if needed.

## 2022-09-09 NOTE — Patient Instructions (Signed)
I will send the urine results to your nephrologist and ask for permission about the fosfomycin.   Start the antibiotic x2 daily for 5 days. Diflucan also sent to the pharmacy for yeast.

## 2022-09-10 ENCOUNTER — Ambulatory Visit: Payer: PPO | Admitting: Obstetrics and Gynecology

## 2022-09-10 LAB — URINE CULTURE: Culture: <10000 — AB

## 2022-09-11 ENCOUNTER — Emergency Department (HOSPITAL_BASED_OUTPATIENT_CLINIC_OR_DEPARTMENT_OTHER)
Admission: EM | Admit: 2022-09-11 | Discharge: 2022-09-11 | Disposition: A | Discharge status: Home / Self Care | Payer: PPO | Attending: Emergency Medicine | Admitting: Emergency Medicine

## 2022-09-11 ENCOUNTER — Telehealth: Payer: Self-pay

## 2022-09-11 ENCOUNTER — Emergency Department (HOSPITAL_BASED_OUTPATIENT_CLINIC_OR_DEPARTMENT_OTHER): Payer: PPO

## 2022-09-11 ENCOUNTER — Encounter (HOSPITAL_BASED_OUTPATIENT_CLINIC_OR_DEPARTMENT_OTHER): Payer: Self-pay

## 2022-09-11 ENCOUNTER — Other Ambulatory Visit (HOSPITAL_BASED_OUTPATIENT_CLINIC_OR_DEPARTMENT_OTHER): Payer: Self-pay

## 2022-09-11 ENCOUNTER — Other Ambulatory Visit: Payer: Self-pay

## 2022-09-11 DIAGNOSIS — E039 Hypothyroidism, unspecified: Secondary | ICD-10-CM | POA: Insufficient documentation

## 2022-09-11 DIAGNOSIS — Z7982 Long term (current) use of aspirin: Secondary | ICD-10-CM | POA: Insufficient documentation

## 2022-09-11 DIAGNOSIS — Z0389 Encounter for observation for other suspected diseases and conditions ruled out: Secondary | ICD-10-CM | POA: Diagnosis not present

## 2022-09-11 DIAGNOSIS — Z91040 Latex allergy status: Secondary | ICD-10-CM | POA: Diagnosis not present

## 2022-09-11 DIAGNOSIS — R42 Dizziness and giddiness: Secondary | ICD-10-CM

## 2022-09-11 DIAGNOSIS — R4182 Altered mental status, unspecified: Secondary | ICD-10-CM | POA: Diagnosis not present

## 2022-09-11 DIAGNOSIS — I509 Heart failure, unspecified: Secondary | ICD-10-CM | POA: Insufficient documentation

## 2022-09-11 DIAGNOSIS — N189 Chronic kidney disease, unspecified: Secondary | ICD-10-CM | POA: Diagnosis not present

## 2022-09-11 DIAGNOSIS — R410 Disorientation, unspecified: Secondary | ICD-10-CM | POA: Diagnosis not present

## 2022-09-11 LAB — COMPREHENSIVE METABOLIC PANEL
ALT: 14 U/L (ref 0–44)
AST: 20 U/L (ref 15–41)
Albumin: 4 g/dL (ref 3.5–5.0)
Alkaline Phosphatase: 62 U/L (ref 38–126)
Anion gap: 9 (ref 5–15)
BUN: 22 mg/dL (ref 8–23)
CO2: 26 mmol/L (ref 22–32)
Calcium: 8.9 mg/dL (ref 8.9–10.3)
Chloride: 102 mmol/L (ref 98–111)
Creatinine, Ser: 1.62 mg/dL — ABNORMAL HIGH (ref 0.44–1.00)
GFR, Estimated: 34 mL/min — ABNORMAL LOW (ref >60)
Glucose, Bld: 94 mg/dL (ref 70–99)
Potassium: 4.5 mmol/L (ref 3.5–5.1)
Sodium: 137 mmol/L (ref 135–145)
Total Bilirubin: 0.3 mg/dL (ref 0.3–1.2)
Total Protein: 6.7 g/dL (ref 6.5–8.1)

## 2022-09-11 LAB — URINALYSIS, ROUTINE W REFLEX MICROSCOPIC
Bacteria, UA: NONE SEEN
Bilirubin Urine: NEGATIVE
Glucose, UA: NEGATIVE mg/dL
Ketones, ur: NEGATIVE mg/dL
Nitrite: NEGATIVE
Protein, ur: 30 mg/dL — AB
Specific Gravity, Urine: 1.017 (ref 1.005–1.030)
WBC, UA: >50 WBC/hpf (ref 0–5)
pH: 7 (ref 5.0–8.0)

## 2022-09-11 LAB — DIFFERENTIAL
Abs Immature Granulocytes: 0.03 10*3/uL (ref 0.00–0.07)
Basophils Absolute: 0 10*3/uL (ref 0.0–0.1)
Basophils Relative: 1 %
Eosinophils Absolute: 0.5 10*3/uL (ref 0.0–0.5)
Eosinophils Relative: 11 %
Immature Granulocytes: 1 %
Lymphocytes Relative: 26 %
Lymphs Abs: 1.2 10*3/uL (ref 0.7–4.0)
Monocytes Absolute: 0.5 10*3/uL (ref 0.1–1.0)
Monocytes Relative: 10 %
Neutro Abs: 2.5 10*3/uL (ref 1.7–7.7)
Neutrophils Relative %: 51 %

## 2022-09-11 LAB — PROTIME-INR
INR: 1 (ref 0.8–1.2)
Prothrombin Time: 13.3 seconds (ref 11.4–15.2)

## 2022-09-11 LAB — CBC
HCT: 37.7 % (ref 36.0–46.0)
Hemoglobin: 12.4 g/dL (ref 12.0–15.0)
MCH: 29.7 pg (ref 26.0–34.0)
MCHC: 32.9 g/dL (ref 30.0–36.0)
MCV: 90.4 fL (ref 80.0–100.0)
Platelets: 308 10*3/uL (ref 150–400)
RBC: 4.17 MIL/uL (ref 3.87–5.11)
RDW: 15.1 % (ref 11.5–15.5)
WBC: 4.7 10*3/uL (ref 4.0–10.5)
nRBC: 0 % (ref 0.0–0.2)

## 2022-09-11 LAB — APTT: aPTT: 37 seconds — ABNORMAL HIGH (ref 24–36)

## 2022-09-11 LAB — ETHANOL: Alcohol, Ethyl (B): <10 mg/dL (ref <10)

## 2022-09-11 LAB — CBG MONITORING, ED: Glucose-Capillary: 99 mg/dL (ref 70–99)

## 2022-09-11 NOTE — Telephone Encounter (Signed)
[name] is a 72 y.o. female called in saying she does not feel right. Pt said she can lift her arm but feels something is wrong and can't remember antything. Pt was advised to go to the ER.  Pt verbalized understanding.

## 2022-09-11 NOTE — Discharge Instructions (Signed)
You were seen in the emergency department for your dizziness and forgetfulness.  Your workup here showed no signs of stroke, dehydration or worsening anemia.  There is no bacteria in your urine so your antibiotics are likely working sufficiently for your UTI.  Your dizziness might be related to your infection or could be related to the Benadryl or other medications that you are on.  You should follow-up with your primary doctor next week as planned to have your symptoms rechecked.  You should return to the emergency department if your dizziness is getting worse and you are unable to walk, you pass out, you have numbness or weakness on one side of the body compared to the other or if you have any other new or concerning symptoms.

## 2022-09-11 NOTE — ED Provider Notes (Signed)
[place] EMERGENC[facility] DEPARTMENT AT [facility] Provider Note   CSN: [id number] Arrival date & time: 09/11/22  1013     History  Chief Complaint  Patient presents with   Altered Mental Status    [name] is a 72 y.o. female.  Patient is a 72 year old female with a past medical history of hypothyroidism, OCD, neurogenic bladder with recurrent UTIs, CHF, CKD presenting to the emergency department with dizziness and word finding difficulty.  The patient states that she has been feeling dizzy and off-balance for the last several days.  She states that she is also had intermittent word finding difficulties and memory issues.  She states that this acutely worsened last night.  She states that 2 days ago she did start to develop a rash around her lips and did take Benadryl last night without significant improvement.  She also reports that she was having increased bladder pain and was started on antibiotics for UTI yesterday.  She denies any fevers, numbness or weakness, nausea or vomiting.  The history is provided by the patient and a relative.  Altered Mental Status      Home Medications Prior to Admission medications   Medication Sig Start Date End Date Taking? Authorizing Provider  ALPRAZolam (XANAX) 0.5 MG tablet TAKE 1/2 TO 1 TABLET ONCE A DAY IF NEEDED FOR SEVERE ANXIETY OR SLEEP. **Use sparingly to prevent addiction and tolerance. 12/31/20   Judd Gaudier, NP  aspirin EC 81 MG tablet Take 81 mg by mouth daily.     [provider]  buPROPion (WELLBUTRIN XL) 150 MG 24 hr tablet Take 3 tablets (450 mg total) by mouth daily for depression. 06/02/22   Lucky Cowboy, MD  cetirizine (ZYRTEC) 10 MG tablet Take 10 mg by mouth daily.    [provider]  Cholecalciferol (VITAMIN D-3) 5000 UNITS TABS Take 5,000 Units by mouth daily.     [provider]  Cinnamon 500 MG capsule Take 500 mg by mouth daily.    [provider]  ciprofloxacin  (CIPRO) 250 MG tablet Take 1 tablet (250 mg total) by mouth 2 (two) times daily for 5 days. 09/09/22 09/14/22  Selmer Dominion, NP  diphenhydrAMINE (BENADRYL) 25 MG tablet Take 25 mg by mouth every 6 (six) hours as needed.    [provider]  estradiol (ESTRACE) 0.1 MG/GM vaginal cream Place 0.5 g vaginally 2 (two) times a week. Place 0.5g nightly for two weeks then twice a week after 07/07/21   Marguerita Beards, MD  famotidine (PEPCID) 20 MG tablet TAKE 1 TABLET BY MOUTH TWICE DAILY FOR INDIGESTION AND HEARTBURN Patient taking differently: Take 40 mg by mouth 2 (two) times daily. 08/27/20 06/16/22  Judd Gaudier, NP  fluconazole (DIFLUCAN) 150 MG tablet Take 1 tablet (150 mg total) by mouth daily for 2 days. 09/09/22 09/11/22  Selmer Dominion, NP  guaifenesin (HUMIBID E) 400 MG TABS tablet Take 400 mg by mouth daily.    [provider]  hyoscyamine (LEVSIN SL) 0.125 MG SL tablet DISSOLVE ONE TABLET UNDER THE TONGUE EVERY 4 HOURS IF NEEDED FOR NAUSEA, BLOATING, CRAMPING, OR DIARRHEA 01/27/22   Cranford, Archie Patten, NP  ipratropium-albuterol (DUONEB) 0.5-2.5 (3) MG/3ML SOLN USE 1 VIAL IN NEBULIZER EVERY 4 HOURS AS NEEDED **MAX 6 DOSES A DAY** 11/24/19   Judd Gaudier, NP  levothyroxine (SYNTHROID) 100 MCG tablet Take  1 tablet  Daily  on an empty stomach with only water for 30 minutes & no Antacid meds,  Calcium or Magnesium for 4 hours & avoid Biotin 08/11/22   Lucky Cowboy, MD  ondansetron (ZOFRAN) 8 MG tablet TAKE 1 TABLET BY MOUTH 3 TIMES A DAY AS NEEDED FOR NAUSEA Patient taking differently: Take 8 mg by mouth every 8 (eight) hours as needed. TAKE 1 TABLET BY MOUTH 3 TIMES A DAY AS NEEDED FOR NAUSEA 10/02/20   Judd Gaudier, NP  pantoprazole (PROTONIX) 20 MG tablet TAKE 1 TABLET BY MOUTH DAILY 08/17/22   Raynelle Dick, NP  rosuvastatin (CRESTOR) 5 MG tablet Take  1 tablet 3 x /week for Cholesterol                                                         /                    TAKE                                      BY                                  MOUTH 04/06/22   Lucky Cowboy, MD  sertraline (ZOLOFT) 100 MG tablet Take 1 tablet by mouth daily for mood 11/20/21   Raynelle Dick, NP      Allergies    Ace inhibitors, Cephalexin, Dilaudid [hydromorphone hcl], Robaxin [methocarbamol], Macrobid [nitrofurantoin], Levaquin [levofloxacin], Neurontin [gabapentin], Codeine, Latex, and Sulfa antibiotics    Review of Systems   Review of Systems  Physical Exam Updated Vital Signs BP (!) 171/141   Pulse 78   Temp 98.8 F (37.1 C) (Oral)   Resp 15   Ht 5\' 3"  (1.6 m)   Wt 74.8 kg   SpO2 93%   BMI 29.23 kg/m  Physical Exam Vitals and nursing note reviewed.  Constitutional:      General: She is not in acute distress.    Appearance: Normal appearance.  HENT:     Head: Normocephalic and atraumatic.     Nose: Nose normal.     Mouth/Throat:     Mouth: Mucous membranes are moist.     Pharynx: Oropharynx is clear.  Eyes:     Extraocular Movements: Extraocular movements intact.     Conjunctiva/sclera: Conjunctivae normal.     Pupils: Pupils are equal, round, and reactive to light.     Comments: No nystagmus  Cardiovascular:     Rate and Rhythm: Normal rate and regular rhythm.     Heart sounds: Normal heart sounds.  Pulmonary:     Effort: Pulmonary effort is normal.     Breath sounds: Normal breath sounds.  Abdominal:     General: Abdomen is flat.     Palpations: Abdomen is soft.     Tenderness: There is no abdominal tenderness.  Musculoskeletal:        General: Normal range of motion.     Cervical back: Normal range of motion and neck supple.  Skin:    General: Skin is warm and dry.  Neurological:     General: No focal deficit present.     Mental Status: She is alert and oriented to person,  place, and time.     Cranial Nerves: No cranial nerve deficit.     Sensory: No sensory deficit.     Motor: No weakness.     Coordination: Coordination normal.      Comments: Normal speech No drift in all 4 extremities  Psychiatric:        Mood and Affect: Mood normal.        Behavior: Behavior normal.     ED Results / Procedures / Treatments   Labs (all labs ordered are listed, but only abnormal results are displayed) Labs Reviewed  APTT - Abnormal; Notable for the following components:      Result Value   aPTT 37 (*)    All other components within normal limits  COMPREHENSIVE METABOLIC PANEL - Abnormal; Notable for the following components:   Creatinine, Ser 1.62 (*)    GFR, Estimated 34 (*)    All other components within normal limits  URINALYSIS, ROUTINE W REFLEX MICROSCOPIC - Abnormal; Notable for the following components:   APPearance HAZY (*)    Hgb urine dipstick TRACE (*)    Protein, ur 30 (*)    Leukocytes,Ua LARGE (*)    All other components within normal limits  PROTIME-INR  CBC  DIFFERENTIAL  ETHANOL  CBG MONITORING, ED    EKG EKG Interpretation  Date/Time:  Friday Sep 11 2022 10:33:39 EDT Ventricular Rate:  73 PR Interval:  143 QRS Duration: 143 QT Interval:  435 QTC Calculation: 480 R Axis:   -35 Text Interpretation: Sinus rhythm Left bundle branch block No significant change since last tracing Confirmed by Elayne Snare ([id number]) on 09/11/2022 10:42:06 AM  Radiology MR BRAIN WO CONTRAST  Result Date: 09/11/2022 CLINICAL DATA:  TIA EXAM: MRI HEAD WITHOUT CONTRAST TECHNIQUE: Multiplanar, multiecho pulse sequences of the brain and surrounding structures were obtained without intravenous contrast. COMPARISON:  Same day CT head FINDINGS: Brain: Negative for an acute infarct. No hemorrhage. No extra-axial fluid collection. No hydrocephalus. There is sequela of mild chronic microvascular ischemic change. Vascular: Normal flow voids. Skull and upper cervical spine: Normal marrow signal. Sinuses/Orbits: No middle ear or mastoid effusion. Paranasal sinuses are clear. Right lens replacement. Other: Known IMPRESSION: No acute  intracranial process. Electronically Signed   By: Lorenza Cambridge M.D.   On: 09/11/2022 13:20   CT Head Wo Contrast  Result Date: 09/11/2022 CLINICAL DATA:  TIA. Episode of confusion and difficulty finding words last night. Dizziness. EXAM: CT HEAD WITHOUT CONTRAST TECHNIQUE: Contiguous axial images were obtained from the base of the skull through the vertex without intravenous contrast. RADIATION DOSE REDUCTION: This exam was performed according to the departmental dose-optimization program which includes automated exposure control, adjustment of the mA and/or kV according to patient size and/or use of iterative reconstruction technique. COMPARISON:  CT head without contrast 01/31/2021 FINDINGS: Brain: No acute infarct, hemorrhage, or mass lesion is present. No significant white matter lesions are present. The ventricles are of normal size. No significant extraaxial fluid collection is present. The brainstem and cerebellum are within normal limits. Midline structures are within normal limits. Vascular: Atherosclerotic calcifications are present in the cavernous internal carotid arteries and at the dural margin of both vertebral arteries. No hyperdense vessel is present. Skull: Calvarium is intact. No focal lytic or blastic lesions are present. No significant extracranial soft tissue lesion is present. Sinuses/Orbits: The paranasal sinuses and mastoid air cells are clear. A right lens replacement is noted. Globes and orbits are otherwise within normal limits. Other: The  paranasal sinuses and mastoid air cells are clear. A right lens replacement is noted. Globes and orbits are otherwise within normal limits. IMPRESSION: 1. Normal CT appearance of the brain. 2. Atherosclerosis. Electronically Signed   By: Marin Roberts M.D.   On: 09/11/2022 11:48    Procedures Procedures    Medications Ordered in ED Medications - No data to display  ED Course/ Medical Decision Making/ A&P Clinical Course as of  09/11/22 1356  Fri Sep 11, 2022  1152 No acute disease on CT head, labs within normal range. [VK]  1324 No acute abnormality on MRI, I will have the patient ambulate and she will likely be stable for discharge with outpatient follow up. [VK]  1355 Patient was able to ambulate steadily around the room.  Romberg is negative.  She stable for discharge home with outpatient follow-up and was given strict return precautions. [VK]    Clinical Course User Index [VK] Rexford Maus, DO                             Medical Decision Making This patient presents to the ED with chief complaint(s) of dizziness, speech difficulty with pertinent past medical history of hypothyroidism, CKD, OCD, neurogenic bladder, CHF which further complicates the presenting complaint. The complaint involves an extensive differential diagnosis and also carries with it a high risk of complications and morbidity.    The differential diagnosis includes patient has no focal neurologic deficits making CVA unlikely, considering TIA, electrolyte abnormality, dehydration, arrhythmia, anemia, infection, medication side effects  Additional history obtained: Additional history obtained from family Records reviewed outpatient urogyn records  ED Course and Reassessment: On patient's arrival to the emergency department she is hemodynamically stable in no acute distress and currently has no focal neurologic deficits on exam.  The patient will undergo workup for possible TIA as well as additional causes of her dizziness.  She will be closely reassessed.  Independent labs interpretation:  The following labs were independently interpreted: Within normal range  Independent visualization of imaging: - I independently visualized the following imaging with scope of interpretation limited to determining acute life threatening conditions related to emergency care: CTH, MRI brain, which revealed no acute disease  Consultation: - Consulted  or discussed management/test interpretation w/ external professional: n/a  Consideration for admission or further workup: Patient has no emergent conditions requiring admission or further work-up at this time and is stable for discharge home with primary care follow-up  Social Determinants of health: N/A    Amount and/or Complexity of Data Reviewed Labs: ordered. Radiology: ordered.          Final Clinical Impression(s) / ED Diagnoses Final diagnoses:  Dizziness    Rx / DC Orders ED Discharge Orders     None         Rexford Maus, DO 09/11/22 1356

## 2022-09-11 NOTE — ED Notes (Signed)
Dr Franchot Heidelberg at bedside

## 2022-09-11 NOTE — ED Triage Notes (Signed)
Last night had confusion and difficulty finding works.  Unsteady on feet.  States has been unsteady on feet off and on for a couple weeks.  States also had dizziness.

## 2022-09-11 NOTE — ED Notes (Signed)
Pt's CBG result was 99. Informed Freida Busman - RN.

## 2022-09-11 NOTE — ED Notes (Signed)
Patient transported to MRI 

## 2022-09-15 ENCOUNTER — Ambulatory Visit (INDEPENDENT_AMBULATORY_CARE_PROVIDER_SITE_OTHER): Payer: PPO | Admitting: Nurse Practitioner

## 2022-09-15 ENCOUNTER — Encounter: Payer: Self-pay | Admitting: Nurse Practitioner

## 2022-09-15 VITALS — BP 100/60 | HR 60 | Temp 97.6°F | Ht 63.0 in | Wt 158.0 lb

## 2022-09-15 DIAGNOSIS — I447 Left bundle-branch block, unspecified: Secondary | ICD-10-CM | POA: Diagnosis not present

## 2022-09-15 DIAGNOSIS — Z0001 Encounter for general adult medical examination with abnormal findings: Secondary | ICD-10-CM | POA: Diagnosis not present

## 2022-09-15 DIAGNOSIS — K582 Mixed irritable bowel syndrome: Secondary | ICD-10-CM | POA: Diagnosis not present

## 2022-09-15 DIAGNOSIS — E782 Mixed hyperlipidemia: Secondary | ICD-10-CM | POA: Diagnosis not present

## 2022-09-15 DIAGNOSIS — I700 Atherosclerosis of aorta: Secondary | ICD-10-CM | POA: Diagnosis not present

## 2022-09-15 DIAGNOSIS — F429 Obsessive-compulsive disorder, unspecified: Secondary | ICD-10-CM

## 2022-09-15 DIAGNOSIS — Z79899 Other long term (current) drug therapy: Secondary | ICD-10-CM | POA: Diagnosis not present

## 2022-09-15 DIAGNOSIS — R300 Dysuria: Secondary | ICD-10-CM | POA: Diagnosis not present

## 2022-09-15 DIAGNOSIS — I5032 Chronic diastolic (congestive) heart failure: Secondary | ICD-10-CM

## 2022-09-15 DIAGNOSIS — R0989 Other specified symptoms and signs involving the circulatory and respiratory systems: Secondary | ICD-10-CM

## 2022-09-15 DIAGNOSIS — R6889 Other general symptoms and signs: Secondary | ICD-10-CM | POA: Diagnosis not present

## 2022-09-15 DIAGNOSIS — E559 Vitamin D deficiency, unspecified: Secondary | ICD-10-CM

## 2022-09-15 DIAGNOSIS — R7309 Other abnormal glucose: Secondary | ICD-10-CM

## 2022-09-15 DIAGNOSIS — N1831 Chronic kidney disease, stage 3a: Secondary | ICD-10-CM

## 2022-09-15 DIAGNOSIS — I2510 Atherosclerotic heart disease of native coronary artery without angina pectoris: Secondary | ICD-10-CM | POA: Diagnosis not present

## 2022-09-15 DIAGNOSIS — K5730 Diverticulosis of large intestine without perforation or abscess without bleeding: Secondary | ICD-10-CM

## 2022-09-15 DIAGNOSIS — J432 Centrilobular emphysema: Secondary | ICD-10-CM | POA: Diagnosis not present

## 2022-09-15 DIAGNOSIS — Z86010 Personal history of colonic polyps: Secondary | ICD-10-CM

## 2022-09-15 DIAGNOSIS — E039 Hypothyroidism, unspecified: Secondary | ICD-10-CM

## 2022-09-15 DIAGNOSIS — N3010 Interstitial cystitis (chronic) without hematuria: Secondary | ICD-10-CM | POA: Diagnosis not present

## 2022-09-15 DIAGNOSIS — Z0000 Encounter for general adult medical examination without abnormal findings: Secondary | ICD-10-CM

## 2022-09-15 DIAGNOSIS — F3342 Major depressive disorder, recurrent, in full remission: Secondary | ICD-10-CM

## 2022-09-15 DIAGNOSIS — M8580 Other specified disorders of bone density and structure, unspecified site: Secondary | ICD-10-CM

## 2022-09-15 DIAGNOSIS — Z87891 Personal history of nicotine dependence: Secondary | ICD-10-CM

## 2022-09-15 DIAGNOSIS — M6289 Other specified disorders of muscle: Secondary | ICD-10-CM

## 2022-09-15 LAB — CBC WITH DIFFERENTIAL/PLATELET
Basophils Absolute: 59 cells/uL (ref 0–200)
MCHC: 32.4 g/dL (ref 32.0–36.0)
MPV: 10 fL (ref 7.5–12.5)
Monocytes Relative: 7.1 %
Neutro Abs: 3729 cells/uL (ref 1500–7800)
Neutrophils Relative %: 56.5 %
Platelets: 355 10*3/uL (ref 140–400)

## 2022-09-15 NOTE — Patient Instructions (Signed)

## 2022-09-15 NOTE — Progress Notes (Signed)
ANNUAL WELLNESS VISIT AND FOLLOW UP   Assessment:   Diagnoses and all orders for this visit:  Annual Medicare Wellness Visit Due annually  Health maintenance reviewed  Atherosclerosis of aorta (HCC) - Per Ct 2020 Control blood pressure, cholesterol, glucose, increase exercise.   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 Cut back on foods that are high in saturated fat, cholesterol, and trans fats. Eat more whole-grain foods, fish, poultry, and nuts Remain active and exercise as tolerated daily.  Monitor BP at home-Call if greater than 130/80.  Check CMP/CBC   LBBB (left bundle branch block) Continue to monitor Cardiology Dr. Elease Hashimoto PRN per patient preference  Migraine without status migrainosus, not intractable, unspecified migraine type Resolved Remain well hydrated  Irritable bowel syndrome, unspecified type Symptoms stable/improved  Continue monitoring diet/lifestyle modification Suggest probiotic  Hypothyroidism, unspecified type Controlled. Continue Levothyroxine. Reminded to take on an empty stomach 30-80mins before food.  Stop any Biotin Supplement 48-72 hours before next TSH level to reduce the risk of falsely low TSH levels. Continue to monitor.     CKD Stage 3 (HCC) Discussed how what you eat and drink can aide in kidney protection. Stay well hydrated. Avoid high salt foods. Avoid NSAIDS. Keep BP and BG well  controlled.   Take medications as prescribed. Remain active and exercise as tolerated daily. Maintain weight.  Continue to monitor. Check CMP/GFR/Microablumin  Major depression, recurrent, full remission (HCC) Grief counseling offered - patient declines, plans to continue family support. Continue medications; rare benzo; doing well with current regimen, Zoloft, Continue Wellbutrin. Lifestyle discussed: diet/exerise, sleep hygiene, stress management, hydration  Obsessive-compulsive disorder, unspecified type Symptoms improved on SSRI - Continue Zoloft  Diverticulosis of intestine without bleeding, unspecified intestinal tract location Increase fiber intake Stay well hydrated Continue to monitor  Medication management All medications discussed and reviewed in full. All questions and concerns regarding medications addressed.     Mixed hyperlipidemia Discussed lifestyle modifications. Recommended diet heavy in fruits and veggies, omega 3's. Decrease consumption of animal meats, cheeses, and dairy products. Remain active and exercise as tolerated. Continue to monitor. Check lipids/TSH  Other abnormal glucose Education: Reviewed 'ABCs' of diabetes management  Discussed goals to be met and/or maintained include A1C (<7) Blood pressure (<130/80) Cholesterol (LDL <70) Continue Eye Exam yearly  Continue Dental Exam Q6 mo Discussed dietary recommendations Discussed Physical Activity recommendations Check A1C  Vitamin D deficiency Continue supplement for goal of 60-100 Monitor Vitamin D levels  Osteopenia Continue Bone Density screening Q2 Years Pursue a combination of weight-bearing exercises and strength training. Advised on fall prevention measures including proper lighting in all rooms, removal of area rugs and floor clutter, use of walking devices as deemed appropriate, avoidance of uneven walking surfaces. Smoking cessation and moderate alcohol consumption if  applicable Consume 800 to 1000 IU of vitamin D daily with a goal vitamin D serum value of 30 ng/mL or higher. Aim for 1000 to 1200 mg of elemental calcium daily through supplements and/or dietary sources.   CHF,  diastolic (HCC) Per ECHO while admitted 02/2019 No hx prior, denies notable sx since that time Dr. Eden Emms recommended no dedicated follow up unless new sx Limit sodium; monitor weight and control BP  Hx of colon polyps Next colonoscopy due 10/2025 High fiber diet, low red meat/processed meat  Former smoker, 40 pack year, quit 2004/Emphysema CT chest 08/2020 was benign, no further per radiology  Low threshold for CXR with sx, denies today   IC/pelvic floor dysfunction Has OV planned with uro/GYN, limited benefit with pelvic floor PT, has upcoming surgery planned  Dysuria Stay well hydrated to keep urinary system well flushed Consider cranberry supplement Will check UA today   Orders Placed This Encounter  Procedures   Urine Culture   CBC with Differential/Platelet   COMPLETE METABOLIC PANEL WITH GFR   Lipid panel   TSH   Hemoglobin A1c   VITAMIN D 25 Hydroxy (Vit-D Deficiency, Fractures)   Urinalysis, Routine w reflex microscopic    Notify office for further evaluation and treatment, questions or concerns if any reported s/s fail to improve.   The patient was advised to call back or seek an in-person evaluation if any symptoms worsen or if the condition fails to improve as anticipated.   Further disposition pending results of labs. Discussed med's effects and SE's.    I discussed the assessment and treatment plan with the patient. The patient was provided an opportunity to ask questions and all were answered. The patient agreed with the plan and demonstrated an understanding of the instructions.  Discussed med's effects and SE's. Screening labs and tests as requested with regular follow-up as recommended.  I provided 40 minutes of face-to-face time during this  encounter including counseling, chart review, and critical decision making was preformed.  Today's Plan of Care is based on a patient-centered health care approach known as shared decision making - the decisions, tests and treatments allow for patient preferences and values to be balanced with clinical evidence.     Future Appointments  Date Time Provider Department Center  12/16/2022 10:30 AM Adela Glimpse, NP [facility] None  06/17/2023 10:00 AM Adela Glimpse, NP [facility] None     Plan:   During the course of the visit the patient was educated and counseled about appropriate screening and preventive services including:   Pneumococcal vaccine  Prevnar 13 Influenza vaccine Td vaccine Screening electrocardiogram Bone densitometry screening Colorectal cancer screening Diabetes screening Glaucoma screening Nutrition counseling  Advanced directives: requested   Subjective:  [name] is a 72 y.o. female who presents for AWV and follow up. She has Hypothyroidism; Major depression, recurrent, full remission (HCC); OCD (obsessive compulsive disorder); Abnormal glucose; Migraines; Vitamin D deficiency; LBBB (left bundle branch block); HTN (hypertension); Hyperlipidemia, mixed; Medication management; CKD Stage 3 (HCC); IBS ; History of colon polyps; Osteopenia; Chronic diastolic CHF (congestive heart failure) (HCC); Atherosclerosis of aorta (HCC) by Chest CT scan in 2020 ; Emphysema lung (HCC) - per CT 03/09/2019; Former smoker (40 pack year, quit 2004); Overweight (BMI 25.0-29.9); Interstitial cystitis; and Coronary atherosclerosis of native coronary artery (per CTA 01/31/2021)  on their problem list.  She shares with me today that her youngest son, 62 yo has been diagnosed with rectal cancer stage IV.  Currently undergoing chemo and radiation in [place].  She continues to be in the grieving and acceptance stage.  Not sure if she has accepted what is going on, unable to cry.  She wants to  be with her son but financially not  feasible.  Has too many responsibilities here.  She is going to try and see him next month.  She has restarted Wellbutrin.  She defers grief counseling, feels as though she has good family support.  Denies extreme mood swings, HI/SI.  She has a diagnosis of depression/anxiety/OCD and is taking xanax 0.5 mg TID PRN,.  Of note she was see in the ER on 09/11/22 for dizziness, stroke work up which was negative.  Feels as though it could be r/t the first botox injection she received for incontinence.  She continues to self cath.  She is unsure if it is r/t overall stress of son.    She has IC, and pelvic floor dysfunction, follows  urogyn Dr. Oleh Genin, has been doing pelvic floor PT with limited benefit.   She had bil inguinal hernia repair by Dr. Michaell Cowing 04/10/2021, doing well with no issues.   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   BMI is Body mass index is 27.99 kg/m., she has not been working on diet. Wt Readings from Last 3 Encounters:  09/15/22 158 lb (71.7 kg)  09/11/22 165 lb (74.8 kg)  06/16/22 162 lb 6.4 oz (73.7 kg)   She has aortic atherosclerosis and centrilobular emphysema per CT chest 02/2019, former smoker, quit in 2004, 40 pack year history. Inhalers only need when ill. 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   In November 2020,  patient had removal if breast implants and had post-op complications post intubation neck & facial swelling with fever & drop in O2 to 79% and all attributed to secondary to cricopharyngeal inflammation.  At this time she underwent Echo 03/10/2019 LVEF estimated 55-60%, Abnormal (paradoxical) septal motion, consistent with left bundle branch block (long history of this on EKG), Grade I diastolic dysfunctoin, she also had some  elevated troponins felt to be demand related. Had follow up with Dr. Eden Emms, was not recommended dedicated follow up. LBBB followed by Dr. Eden Emms.    Her blood pressure has been controlled at home however today their BP is BP: 100/60.  She is no longer taking anti-hypertensive.  She does not workout but is doing lots of intense housework. She denies chest pain, shortness of breath, does endorse fatigue and intermittent dizziness.   She does have aortic atherosclerosis and coronary atherosclerosis per CTA 01/31/2021. Denies angina, dyspnea. She is on bASA, BB. She is currently not on statin.   She is not on cholesterol medication, mild elevations treated by lifestyle modification, has been declining lipid program and no longer on statin. Her cholesterol is not at goal. The cholesterol last visit was:   Lab Results  Component Value Date   CHOL 164 06/16/2022   HDL 67 06/16/2022   LDLCALC 78 06/16/2022   TRIG 105 06/16/2022   CHOLHDL 2.4 06/16/2022    She has been working on diet and exercise for glucose management, and denies increased appetite, nausea, paresthesia of the feet, polydipsia, polyuria, visual disturbances and vomiting. Last A1C in the office was:  Lab Results  Component Value Date   HGBA1C 5.7 (H) 06/16/2022   She is on thyroid medication. Her medication was not changed last visit, taking 125 mcg daily Lab Results  Component Value Date   TSH 0.49 06/16/2022   She is followed by  nephrology Dr. Dierdre Highman for CKD III Lab Results  Component Value Date   EGFR 57 (L) 06/16/2022   Patient is on Vitamin D supplement and at goal at recent check:  Lab Results  Component Value Date   VD25OH 79 06/16/2022     Stopped B complex supplement taking irregularly Lab Results  Component Value Date   VITAMINB12 1,001 06/13/2021     Medication Review: Current Outpatient Medications on File Prior to Visit  Medication Sig Dispense Refill   ALPRAZolam (XANAX) 0.5 MG tablet TAKE  1/2 TO 1 TABLET ONCE A DAY IF NEEDED FOR SEVERE ANXIETY OR SLEEP. **Use sparingly to prevent addiction and tolerance. 30 tablet 0   atenolol (TENORMIN) 50 MG tablet Take 50 mg by mouth daily.     aspirin EC 81 MG tablet Take 81 mg by mouth daily.      buPROPion (WELLBUTRIN XL) 150 MG 24 hr tablet Take 3 tablets (450 mg total) by mouth daily for depression. 270 tablet 3   cetirizine (ZYRTEC) 10 MG tablet Take 10 mg by mouth daily.     Cholecalciferol (VITAMIN D-3) 5000 UNITS TABS Take 5,000 Units by mouth daily.      Cinnamon 500 MG capsule Take 500 mg by mouth daily.     diphenhydrAMINE (BENADRYL) 25 MG tablet Take 25 mg by mouth every 6 (six) hours as needed.     estradiol (ESTRACE) 0.1 MG/GM vaginal cream Place 0.5 g vaginally 2 (two) times a week. Place 0.5g nightly for two weeks then twice a week after 30 g 11   famotidine (PEPCID) 20 MG tablet TAKE 1 TABLET BY MOUTH TWICE DAILY FOR INDIGESTION AND HEARTBURN (Patient taking differently: Take 40 mg by mouth 2 (two) times daily.) 180 tablet 3   guaifenesin (HUMIBID E) 400 MG TABS tablet Take 400 mg by mouth daily.     hyoscyamine (LEVSIN SL) 0.125 MG SL tablet DISSOLVE ONE TABLET UNDER THE TONGUE EVERY 4 HOURS IF NEEDED FOR NAUSEA, BLOATING, CRAMPING, OR DIARRHEA 120 tablet 3   ipratropium-albuterol (DUONEB) 0.5-2.5 (3) MG/3ML SOLN USE 1 VIAL IN NEBULIZER EVERY 4 HOURS AS NEEDED **MAX 6 DOSES A DAY** 540 mL 0   levothyroxine (SYNTHROID) 100 MCG tablet Take  1 tablet  Daily  on an empty stomach with only water for 30 minutes & no Antacid meds, Calcium or Magnesium for 4 hours & avoid Biotin 90 tablet 3   ondansetron (ZOFRAN) 8 MG tablet TAKE 1 TABLET BY MOUTH 3 TIMES A DAY AS NEEDED FOR NAUSEA (Patient taking differently: Take 8 mg by mouth every 8 (eight) hours as needed. TAKE 1 TABLET BY MOUTH 3 TIMES A DAY AS NEEDED FOR NAUSEA) 60 tablet 2   pantoprazole (PROTONIX) 20 MG tablet TAKE 1 TABLET BY MOUTH DAILY 90 tablet 2   rosuvastatin (CRESTOR) 5  MG tablet Take  1 tablet 3 x /week for Cholesterol                                                         /                    TAKE  BY                                  MOUTH (Patient not taking: Reported on 09/15/2022) 39 tablet 3   sertraline (ZOLOFT) 100 MG tablet Take 1 tablet by mouth daily for mood 90 tablet 3   No current facility-administered medications on file prior to visit.    Allergies  Allergen Reactions   Ace Inhibitors Other (See Comments)    Acute renal failure (lisinopril)   Cephalexin Anaphylaxis   Dilaudid [Hydromorphone Hcl] Anaphylaxis   Robaxin [Methocarbamol] Anaphylaxis   Macrobid [Nitrofurantoin] Other (See Comments)    Causes hyperthermia    Levaquin [Levofloxacin] Nausea And Vomiting   Neurontin [Gabapentin]     Confusion    Codeine Nausea Only   Latex Rash   Sulfa Antibiotics Other (See Comments)    Do not take regularly per nephrology.  Can use for acute issues.     Current Problems (verified) Patient Active Problem List   Diagnosis Date Noted   Coronary atherosclerosis of native coronary artery (per CTA 01/31/2021)  06/14/2021   Interstitial cystitis 06/14/2020   Overweight (BMI 25.0-29.9) 06/13/2020   Former smoker (40 pack year, quit 2004) 08/30/2019   Atherosclerosis of aorta (HCC) by Chest CT scan in 2020  03/22/2019   Emphysema lung (HCC) - per CT 03/09/2019    Chronic diastolic CHF (congestive heart failure) (HCC) 03/11/2019   Osteopenia 01/16/2019   History of colon polyps 06/28/2018   CKD Stage 3 (HCC) 01/10/2015   IBS  01/10/2015   Hyperlipidemia, mixed 06/12/2014   Medication management 06/12/2014   LBBB (left bundle branch block) 07/24/2013   HTN (hypertension) 07/24/2013   Major depression, recurrent, full remission (HCC)    OCD (obsessive compulsive disorder)    Abnormal glucose    Migraines    Vitamin D deficiency    Hypothyroidism 04/05/2013    Screening Tests Immunization History   Administered Date(s) Administered   Influenza Split 01/31/2016, 05/12/2018, 03/17/2019   Influenza, High Dose Seasonal PF 03/22/2017, 02/16/2018, 03/22/2019, 03/24/2021, 02/17/2022   Influenza-Unspecified 01/31/2016, 02/09/2020   PFIZER Comirnaty(Arenz Top)Covid-19 Tri-Sucrose Vaccine 03/31/2021   PFIZER(Purple Top)SARS-COV-2 Vaccination 07/05/2019, 07/26/2019, 03/04/2020   Pneumococcal Conjugate-13 03/22/2017, 06/29/2018   Pneumococcal Polysaccharide-23 02/12/1999, 06/29/2018   Td 04/27/2013   Zoster Recombinat (Shingrix) 06/14/2020, 08/28/2020   Zoster, Live 11/24/2005   Health Maintenance  Topic Date Due   COVID-19 Vaccine (5 - 2023-24 season) 12/26/2021   INFLUENZA VACCINE  11/26/2022   DTaP/Tdap/Td (2 - Tdap) 04/28/2023   Medicare Annual Wellness (AWV)  09/15/2023   MAMMOGRAM  11/19/2023   DEXA SCAN  11/19/2023   COLONOSCOPY (Pts 45-53yrs Insurance coverage will need to be confirmed)  11/14/2025   Pneumonia Vaccine 27+ Years old  Completed   Hepatitis C Screening  Completed   Zoster Vaccines- Shingrix  Completed   HPV VACCINES  Aged Out   Preventative care: Last colonoscopy: 10/2020 Dr. Russella Dar, polyps, 5 year recall Last EGD: 02/2019  Last mammogram: annually at breast center, has upcoming scheduled 11/18/2021 Last breast MRI: 10/2018 Last pap smear/pelvic exam: 2018, s/p total hysterectomy, DONE  DEXA:  11/18/2021 T Score -2.3  Former smoker, 40 pack year, quit 2004 Last CT chest 09/17/2020, emphysema, aortic atherosclerosis, no nodule, no further follow up recommended  Names of Other Physician/Practitioners you currently use: 1. [facility] here for primary care 2. Dr. Emily Filbert, eye  doctor, last visit 2022, encouraged to schedule 3. Dr. Freddrick March, dentist, last visit 07/2021, wants to find a new provider who takes insurance   Patient Care Team: Lucky Cowboy, MD as PCP - General (Internal Medicine) Wendall Stade, MD as PCP -  Cardiology (Cardiology) Terrial Rhodes, MD as Consulting Physician (Nephrology) Manning Charity, OD as Referring Physician (Optometry) [name] Dare, MD as Consulting Physician (Gastroenterology) Wendall Stade, MD as Consulting Physician (Cardiology) Lajoyce Corners, [facility] (Inactive) as Pharmacist (Pharmacist) Vernon Prey, Candace (Gynecology)  SURGICAL HISTORY She  has a past surgical history that includes Tonsillectomy; Bunionectomy (Bilateral, 1997); Hemorrhoid surgery (1976); Breast enhancement surgery (Bilateral); ORIF ankle fracture (Left, 08/30/2017); Breast implant removal (Bilateral, 03/07/2019); Esophagogastroduodenoscopy (egd) with propofol (N/A, 03/12/2019); Colonoscopy (10/2020); Cataract extraction w/ intraocular lens implant (Right, 2005); Inguinal hernia repair (N/A, 04/10/2021); Anterior and posterior repair with sacrospinous fixation (N/A, 11/10/2021); Bladder suspension (N/A, 11/10/2021); and Cystoscopy with fulgeration (11/10/2021). FAMILY HISTORY Her family history includes Colon polyps in her mother; Heart block in her maternal grandmother; Hypertension in her sister; Muscular dystrophy in her maternal grandmother and mother. SOCIAL HISTORY She  reports that she quit smoking about 20 years ago. Her smoking use included cigarettes. She has a 40.00 pack-year smoking history. She has never used smokeless tobacco. She reports current alcohol use. She reports that she does not use drugs.  MEDICARE WELLNESS OBJECTIVES: Physical activity:   Cardiac risk factors:   Depression/mood screen:      09/15/2022   12:58 PM  Depression screen PHQ 2/9  Decreased Interest 1  Down, Depressed, Hopeless 1  PHQ - 2 Score 2  Altered sleeping 1  Tired, decreased energy 1  Change in appetite 0  Feeling bad or failure about yourself  0  Trouble concentrating 1  Moving slowly or fidgety/restless 0  Suicidal thoughts 0  PHQ-9 Score 5  Difficult doing work/chores Somewhat difficult    ADLs:      09/15/2022   12:58 PM 02/16/2022   10:39 PM  In your present state of health, do you have any difficulty performing the following activities:  Hearing? 0 0  Vision? 0 0  Difficulty concentrating or making decisions? 0 0  Walking or climbing stairs? 0 0  Dressing or bathing?  0  Doing errands, shopping? 0 0     Cognitive Testing  Alert? Yes  Normal Appearance?Yes  Oriented to person? Yes  Place? Yes   Time? Yes  Recall of three objects?  Yes  Can perform simple calculations? Yes  Displays appropriate judgment?Yes  Can read the correct time from a watch face?Yes  EOL planning: Does Patient Have a Medical Advance Directive?: Yes Type of Advance Directive: Living will     Review of Systems  Constitutional:  Negative for malaise/fatigue and weight loss.  HENT:  Negative for hearing loss and tinnitus.   Eyes:  Negative for blurred vision and double vision.  Respiratory:  Negative for cough, shortness of breath and wheezing.   Cardiovascular:  Negative for chest pain, palpitations, orthopnea, claudication and leg swelling.  Gastrointestinal:  Positive for diarrhea (improved, intermittent with trigger foods (lactose)). Negative for abdominal pain, blood in stool, constipation, heartburn, melena, nausea and vomiting.  Genitourinary: Negative.   Musculoskeletal:  Negative for falls, joint pain and myalgias.  Skin:  Negative for rash.  Neurological:  Negative for dizziness, tingling, sensory change, weakness and headaches.  Endo/Heme/Allergies:  Negative for polydipsia.  Psychiatric/Behavioral:  Negative for depression, memory loss, substance abuse and suicidal ideas. The  patient is not nervous/anxious and does not have insomnia.   All other systems reviewed and are negative.    Objective:     Today's Vitals   09/15/22 1113  BP: 100/60  Pulse: 60  Temp: 97.6 F (36.4 C)  SpO2: 98%  Weight: 158 lb (71.7 kg)  Height: 5\' 3"  (1.6 m)   Body mass index is 27.99  kg/m.  General appearance: alert, no distress, WD/WN, female HEENT: normocephalic, sclerae anicteric, TMs pearly, nares patent, no discharge or erythema, pharynx normal Oral cavity: MMM, no lesions Neck: supple, no lymphadenopathy, no thyromegaly, no masses Heart: RRR, normal S1, S2, no murmurs Lungs: CTA bilaterally, no wheezes, rhonchi, or rales Abdomen: +bs, soft, non tender, non distended, no masses, no hepatomegaly, no splenomegaly, no hernias. Well healed lap incision sites.  Musculoskeletal: nontender, no swelling, no obvious deformity Extremities: no edema, no cyanosis, no clubbing.  Pulses: 2+ symmetric, upper and lower extremities, normal cap refill Neurological: alert, oriented x 3, CN2-12 intact, strength normal upper extremities and lower extremities, sensation normal throughout, DTRs 2+ throughout, no cerebellar signs, gait normal Psychiatric: normal affect, behavior normal, pleasant    Medicare Attestation I have personally reviewed: The patient's medical and social history Their use of alcohol, tobacco or illicit drugs Their current medications and supplements The patient's functional ability including ADLs,fall risks, home safety risks, cognitive, and hearing and visual impairment Diet and physical activities Evidence for depression or mood disorders  The patient's weight, height, BMI, and visual acuity have been recorded in the chart.  I have made referrals, counseling, and provided education to the patient based on review of the above and I have provided the patient with a written personalized care plan for preventive services.      Adela Glimpse, NP   09/15/2022

## 2022-09-16 ENCOUNTER — Telehealth: Payer: Self-pay

## 2022-09-16 LAB — COMPLETE METABOLIC PANEL WITH GFR
AG Ratio: 1.4 (calc) (ref 1.0–2.5)
ALT: 16 U/L (ref 6–29)
AST: 19 U/L (ref 10–35)
Albumin: 3.8 g/dL (ref 3.6–5.1)
Alkaline phosphatase (APISO): 67 U/L (ref 37–153)
BUN/Creatinine Ratio: 22 (calc) (ref 6–22)
BUN: 37 mg/dL — ABNORMAL HIGH (ref 7–25)
CO2: 28 mmol/L (ref 20–32)
Calcium: 9.6 mg/dL (ref 8.6–10.4)
Chloride: 104 mmol/L (ref 98–110)
Creat: 1.71 mg/dL — ABNORMAL HIGH (ref 0.60–1.00)
Globulin: 2.8 g/dL (calc) (ref 1.9–3.7)
Glucose, Bld: 98 mg/dL (ref 65–99)
Potassium: 5.4 mmol/L — ABNORMAL HIGH (ref 3.5–5.3)
Sodium: 139 mmol/L (ref 135–146)
Total Bilirubin: 0.3 mg/dL (ref 0.2–1.2)
Total Protein: 6.6 g/dL (ref 6.1–8.1)
eGFR: 32 mL/min/{1.73_m2} — ABNORMAL LOW (ref >=60)

## 2022-09-16 LAB — CBC WITH DIFFERENTIAL/PLATELET
Absolute Monocytes: 469 cells/uL (ref 200–950)
Basophils Relative: 0.9 %
Eosinophils Absolute: 581 cells/uL — ABNORMAL HIGH (ref 15–500)
Eosinophils Relative: 8.8 %
HCT: 38.9 % (ref 35.0–45.0)
Hemoglobin: 12.6 g/dL (ref 11.7–15.5)
Lymphs Abs: 1762 cells/uL (ref 850–3900)
MCH: 29.4 pg (ref 27.0–33.0)
MCV: 90.7 fL (ref 80.0–100.0)
RBC: 4.29 10*6/uL (ref 3.80–5.10)
RDW: 14 % (ref 11.0–15.0)
Total Lymphocyte: 26.7 %
WBC: 6.6 10*3/uL (ref 3.8–10.8)

## 2022-09-16 LAB — URINALYSIS, ROUTINE W REFLEX MICROSCOPIC
Bacteria, UA: NONE SEEN /HPF
Bilirubin Urine: NEGATIVE
Glucose, UA: NEGATIVE
Hyaline Cast: NONE SEEN /LPF
Ketones, ur: NEGATIVE
Nitrite: NEGATIVE
RBC / HPF: >=60 /HPF — AB (ref 0–2)
Specific Gravity, Urine: 1.022 (ref 1.001–1.035)
pH: 6 (ref 5.0–8.0)

## 2022-09-16 LAB — MICROSCOPIC MESSAGE

## 2022-09-16 LAB — URINE CULTURE
MICRO NUMBER:: 14985122
Result:: NO GROWTH
SPECIMEN QUALITY:: ADEQUATE

## 2022-09-16 LAB — TSH: TSH: 2.6 mIU/L (ref 0.40–4.50)

## 2022-09-16 LAB — HEMOGLOBIN A1C
Hgb A1c MFr Bld: 5.7 % of total Hgb — ABNORMAL HIGH (ref <5.7)
Mean Plasma Glucose: 117 mg/dL
eAG (mmol/L): 6.5 mmol/L

## 2022-09-16 LAB — LIPID PANEL
Cholesterol: 166 mg/dL (ref <200)
HDL: 64 mg/dL (ref >=50)
LDL Cholesterol (Calc): 78 mg/dL (calc)
Non-HDL Cholesterol (Calc): 102 mg/dL (calc) (ref <130)
Total CHOL/HDL Ratio: 2.6 (calc) (ref <5.0)
Triglycerides: 143 mg/dL (ref <150)

## 2022-09-16 LAB — VITAMIN D 25 HYDROXY (VIT D DEFICIENCY, FRACTURES): Vit D, 25-Hydroxy: 78 ng/mL (ref 30–100)

## 2022-09-16 NOTE — Telephone Encounter (Signed)
Transition Care Management Unsuccessful Follow-up Telephone Call  Date of discharge and from where:  09/11/2022 [facility]  Attempts:  1st Attempt  Reason for unsuccessful TCM follow-up call:  Left voice message  Alynah Schone [facility]  [facility] Population Health Community Resource Care Guide   [email or web address]  ?? [phone]   Website: [email or web address]  [email or web address]

## 2022-09-18 ENCOUNTER — Telehealth: Payer: Self-pay

## 2022-09-18 NOTE — Telephone Encounter (Signed)
Transition Care Management Unsuccessful Follow-up Telephone Call  Date of discharge and from where:  09/11/2022 [facility]  Attempts:  2nd Attempt  Reason for unsuccessful TCM follow-up call:  Left voice message  Alfard Cochrane [facility]  [facility] Population Health Community Resource Care Guide   [email or web address]  ?? [phone]   Website: [email or web address]  [email or web address]

## 2022-09-29 ENCOUNTER — Other Ambulatory Visit (HOSPITAL_COMMUNITY): Payer: Self-pay

## 2022-09-29 ENCOUNTER — Ambulatory Visit (INDEPENDENT_AMBULATORY_CARE_PROVIDER_SITE_OTHER): Payer: PPO | Admitting: Obstetrics and Gynecology

## 2022-09-29 ENCOUNTER — Encounter: Payer: Self-pay | Admitting: Obstetrics and Gynecology

## 2022-09-29 VITALS — BP 132/82 | HR 79

## 2022-09-29 DIAGNOSIS — R3989 Other symptoms and signs involving the genitourinary system: Secondary | ICD-10-CM

## 2022-09-29 LAB — POCT URINALYSIS DIPSTICK
Bilirubin, UA: NEGATIVE
Glucose, UA: NEGATIVE
Nitrite, UA: NEGATIVE
Protein, UA: POSITIVE — AB
Spec Grav, UA: >=1.03 — AB (ref 1.010–1.025)
Urobilinogen, UA: 0.2 E.U./dL
pH, UA: 6 (ref 5.0–8.0)

## 2022-09-29 MED ORDER — HEPARIN SODIUM (PORCINE) 10000 UNIT/ML IJ SOLN
10000.0000 [IU] | Freq: Once | INTRAMUSCULAR | Status: AC
Start: 2022-09-29 — End: 2022-09-29
  Administered 2022-09-29: 10000 [IU] via INTRAVESICAL

## 2022-09-29 MED ORDER — SODIUM BICARBONATE 8.4 % IV SOLN
5.0000 mL | Freq: Once | INTRAVENOUS | Status: AC
Start: 2022-09-29 — End: 2022-09-29
  Administered 2022-09-29: 5 mL

## 2022-09-29 MED ORDER — LIDOCAINE HCL 2 % IJ SOLN
20.0000 mL | Freq: Once | INTRAMUSCULAR | Status: AC
Start: 2022-09-29 — End: 2022-09-29
  Administered 2022-09-29: 400 mg

## 2022-09-29 MED ORDER — BUPIVACAINE HCL 0.25 % IJ SOLN
20.0000 mL | Freq: Once | INTRAMUSCULAR | Status: DC
Start: 2022-09-29 — End: 2022-09-29

## 2022-09-29 MED ORDER — BUPIVACAINE HCL (PF) 0.25 % IJ SOLN
20.0000 mL | Freq: Once | INTRAMUSCULAR | Status: DC
Start: 2022-09-29 — End: 2023-03-16

## 2022-09-29 MED ORDER — HYDROXYZINE HCL 25 MG PO TABS
25.0000 mg | ORAL_TABLET | Freq: Every evening | ORAL | 5 refills | Status: DC
Start: 2022-09-29 — End: 2023-01-06

## 2022-09-29 NOTE — Progress Notes (Signed)
[facility] Return Visit  SUBJECTIVE  History of Present Illness: TESSIA ZIETZ is a 72 y.o. female seen in follow-up for bladder irritation. Plan at last visit was follow up as needed.   Patient reports she has had x2 negative urine cultures. Also was recently seen in ER for possible stroke symptoms that were ruled out via head CT and MRI.    Past Medical History: Patient  has a past medical history of Chronic diastolic CHF (congestive heart failure) (HCC), Chronic kidney disease (CKD), stage III (moderate) (HCC), Depression, Diverticulosis of colon, Emphysema of lung (HCC), GERD (gastroesophageal reflux disease), History of acute pyelonephritis, History of Helicobacter pylori infection (2012), History of recurrent UTIs, History of TIA (transient ischemic attack), Hyperlipidemia, mixed, Hypertension, Hypothyroidism, IBS (irritable bowel syndrome), IC (interstitial cystitis), Inguinal hernia, bilateral, Irritable bowel syndrome with constipation and diarrhea, LBBB (left bundle branch block) (2015), OCD (obsessive compulsive disorder), OSA (obstructive sleep apnea), Osteopenia, Prolapsed hemorrhoids, Seasonal allergies, and Vitamin D deficiency.   Past Surgical History: She  has a past surgical history that includes Tonsillectomy; Bunionectomy (Bilateral, 1997); Hemorrhoid surgery (1976); Breast enhancement surgery (Bilateral); ORIF ankle fracture (Left, 08/30/2017); Breast implant removal (Bilateral, 03/07/2019); Esophagogastroduodenoscopy (egd) with propofol (N/A, 03/12/2019); Colonoscopy (10/2020); Cataract extraction w/ intraocular lens implant (Right, 2005); Inguinal hernia repair (N/A, 04/10/2021); Anterior and posterior repair with sacrospinous fixation (N/A, 11/10/2021); Bladder suspension (N/A, 11/10/2021); and Cystoscopy with fulgeration (11/10/2021).   Medications: She has a current medication list which includes the following prescription(s): hydroxyzine, alprazolam, aspirin  ec, atenolol, bupropion, cetirizine, vitamin d-3, cinnamon, diphenhydramine, estradiol, famotidine, guaifenesin, hyoscyamine, ipratropium-albuterol, levothyroxine, ondansetron, pantoprazole, rosuvastatin, and sertraline, and the following Facility-Administered Medications: bupivacaine (pf).   Allergies: Patient is allergic to ace inhibitors, cephalexin, dilaudid [hydromorphone hcl], robaxin [methocarbamol], macrobid [nitrofurantoin], levaquin [levofloxacin], neurontin [gabapentin], codeine, latex, and sulfa antibiotics.   Social History: Patient  reports that she quit smoking about 20 years ago. Her smoking use included cigarettes. She has a 40.00 pack-year smoking history. She has never used smokeless tobacco. She reports current alcohol use. She reports that she does not use drugs.      OBJECTIVE   Bladder Instillation: The patient was identified and verbally consented for the procedure. The urethra was prepped with Betadine x 3. A 16 Fr foley catheter was inserted and attached to a 60 mL syringe with the plunger removed. The medication was slowly poured into the bladder via the syringe and foley. The medication consisted of 20ml of Lidocaine 2%, 20mL of Bupivicaine 0.25%, 10,000 units/mL Heparin, 5mL Sodium Bicarbonate 8.4%. The foley was removed and the patient was asked to hold the liquids in her bladder for 30-60 minutes if possible.   Precautions were given and patient was instructed to call the office or on-call number for any concerns.  POC Urine: Positive for Large blood, 100mg /dL of protein and Small Leukocytes.   Physical Exam: Vitals:   09/29/22 1523  BP: 132/82  Pulse: 79   Gen: No apparent distress, A&O x 3.  Detailed Urogynecologic Evaluation:  Deferred.    ASSESSMENT AND PLAN    Ms. Radhakrishnan is a 72 y.o. with:  1. Bladder pain    Bladder installation completed today. Will plan to have patient repeat for 2 more weeks as she is going out of state to help her son who has  cancer. Will start patient on Hydroxizine 25mg  daily as well to see if this helps with the bladder pain. She reports she has previously had good results with  DMSO, may consider trying this if needed. Cannot take uribel as she is on an SSRI.   Patient to f/u for repeat bladder installation next week.

## 2022-09-29 NOTE — Patient Instructions (Signed)
F/u for 2nd and 3rd bladder installations

## 2022-09-29 NOTE — Addendum Note (Signed)
Addended by: Richardson Chiquito on: 09/29/2022 04:45 PM   Modules accepted: Orders

## 2022-10-01 ENCOUNTER — Encounter: Payer: Self-pay | Admitting: Obstetrics and Gynecology

## 2022-10-01 ENCOUNTER — Other Ambulatory Visit (HOSPITAL_COMMUNITY): Payer: Self-pay

## 2022-10-01 DIAGNOSIS — R3989 Other symptoms and signs involving the genitourinary system: Secondary | ICD-10-CM

## 2022-10-01 MED ORDER — FLUCONAZOLE 150 MG PO TABS: 150.0000 mg | ORAL_TABLET | Freq: Once | ORAL | 0 refills | Status: AC

## 2022-10-01 MED ORDER — DIMETHYL SULFOXIDE 50 % IS SOLN
50.0000 mL | Freq: Once | INTRAVESICAL | 0 refills | Status: AC
Start: 2022-10-01 — End: 2022-10-01

## 2022-10-02 ENCOUNTER — Encounter: Payer: Self-pay | Admitting: Obstetrics and Gynecology

## 2022-10-02 ENCOUNTER — Other Ambulatory Visit (HOSPITAL_COMMUNITY): Payer: Self-pay

## 2022-10-03 ENCOUNTER — Other Ambulatory Visit (HOSPITAL_COMMUNITY): Payer: Self-pay

## 2022-10-03 MED ORDER — FLUCONAZOLE 150 MG PO TABS
150.0000 mg | ORAL_TABLET | Freq: Once | ORAL | 0 refills | Status: AC
  Filled 2022-10-03: qty 1, 1d supply, fill #0

## 2022-10-04 ENCOUNTER — Other Ambulatory Visit: Payer: Self-pay | Admitting: Obstetrics and Gynecology

## 2022-10-04 DIAGNOSIS — R102 Pelvic and perineal pain: Secondary | ICD-10-CM

## 2022-10-04 MED ORDER — LIDOCAINE-PRILOCAINE 2.5-2.5 % EX CREA
1.0000 | TOPICAL_CREAM | CUTANEOUS | 0 refills | Status: AC | PRN
Start: 2022-10-04 — End: ?

## 2022-10-04 NOTE — Progress Notes (Signed)
Patient is having significant perineal pain and asked for topical medication. EMLA cream sent to pharmacy.

## 2022-10-05 ENCOUNTER — Encounter: Payer: Self-pay | Admitting: Pharmacist

## 2022-10-05 ENCOUNTER — Other Ambulatory Visit: Payer: Self-pay

## 2022-10-05 ENCOUNTER — Other Ambulatory Visit (HOSPITAL_COMMUNITY): Payer: Self-pay

## 2022-10-06 ENCOUNTER — Ambulatory Visit (INDEPENDENT_AMBULATORY_CARE_PROVIDER_SITE_OTHER): Payer: PPO | Admitting: Obstetrics and Gynecology

## 2022-10-06 ENCOUNTER — Other Ambulatory Visit (HOSPITAL_COMMUNITY)
Admission: RE | Admit: 2022-10-06 | Discharge: 2022-10-06 | Disposition: A | Discharge status: Home / Self Care | Payer: PPO | Source: Ambulatory Visit | Attending: Cardiovascular Disease | Admitting: Cardiovascular Disease

## 2022-10-06 VITALS — BP 126/63 | HR 52

## 2022-10-06 DIAGNOSIS — N390 Urinary tract infection, site not specified: Secondary | ICD-10-CM | POA: Diagnosis present

## 2022-10-06 DIAGNOSIS — R3989 Other symptoms and signs involving the genitourinary system: Secondary | ICD-10-CM

## 2022-10-06 DIAGNOSIS — R82998 Other abnormal findings in urine: Secondary | ICD-10-CM | POA: Diagnosis not present

## 2022-10-06 DIAGNOSIS — R350 Frequency of micturition: Secondary | ICD-10-CM

## 2022-10-06 LAB — POCT URINALYSIS DIPSTICK
Bilirubin, UA: NEGATIVE
Glucose, UA: NEGATIVE
Ketones, UA: NEGATIVE
Nitrite, UA: NEGATIVE
Protein, UA: POSITIVE — AB
Spec Grav, UA: 1.015 (ref 1.010–1.025)
Urobilinogen, UA: 0.2 E.U./dL
pH, UA: 7 (ref 5.0–8.0)

## 2022-10-06 MED ORDER — LIDOCAINE HCL 2 % IJ SOLN
20.0000 mL | Freq: Once | INTRAMUSCULAR | Status: AC
Start: 2022-10-06 — End: 2022-10-06
  Administered 2022-10-06: 400 mg

## 2022-10-06 MED ORDER — HEPARIN SODIUM (PORCINE) 10000 UNIT/ML IJ SOLN
10000.0000 [IU] | Freq: Once | INTRAMUSCULAR | Status: AC
Start: 2022-10-06 — End: 2022-10-06
  Administered 2022-10-06: 10000 [IU] via INTRAVESICAL

## 2022-10-06 MED ORDER — SODIUM BICARBONATE 8.4 % IV SOLN
5.0000 mL | Freq: Once | INTRAVENOUS | Status: AC
Start: 2022-10-06 — End: 2022-10-06
  Administered 2022-10-06: 5 mL

## 2022-10-06 MED ORDER — BUPIVACAINE HCL (PF) 0.25 % IJ SOLN
20.0000 mL | Freq: Once | INTRAMUSCULAR | Status: DC
Start: 2022-10-06 — End: 2023-03-16

## 2022-10-06 NOTE — Progress Notes (Signed)
Bladder Instillation: The patient was identified and verbally consented for the procedure. The urethra was prepped with Betadine x 3. A 16 Fr foley catheter was inserted and attached to a 60 mL syringe with the plunger removed. The medication was slowly poured into the bladder via the syringe and foley. The medication consisted of 15ml DMSO diluted to 50% with 15ml Normal Saline, 20ml of Lidocaine 2%, 20mL of Bupivicaine 0.25%, 10,000 units/mL Heparin, 5mL Sodium Bicarbonate 8.4%. The foley was removed and the patient was asked to hold the liquids in her bladder for 30-60 minutes if possible.   Precautions were given and patient was instructed to call the office or on-call number for any concerns.  POC urine positive for leukocytes and blood. Will send for culture, but previous urine samples have looked similar with no culture growth.   Selmer Dominion, NP

## 2022-10-07 ENCOUNTER — Encounter: Payer: Self-pay | Admitting: Obstetrics and Gynecology

## 2022-10-07 LAB — URINE CULTURE: Culture: NO GROWTH

## 2022-10-07 MED ORDER — LIDOCAINE-PRILOCAINE 2.5-2.5 % EX CREA: 1.0000 | TOPICAL_CREAM | CUTANEOUS | 0 refills | Status: DC | PRN

## 2022-10-07 NOTE — Addendum Note (Signed)
Addended by: Selmer Dominion on: 10/07/2022 08:13 AM   Modules accepted: Orders

## 2022-10-08 ENCOUNTER — Telehealth: Payer: Self-pay | Admitting: Gastroenterology

## 2022-10-08 NOTE — Telephone Encounter (Signed)
Inbound call from patient requesting to be directly scheduled for colon procedure due to her son being diagnosed with stage 4 rectal cancer. Offered patient an ov to some in and speak with provider but she advised to send a message instead. Please advise on scheduling.

## 2022-10-08 NOTE — Telephone Encounter (Signed)
The pt is due for colon in 2027 and would like to have a colon now due to her son being diagnosed with rectal cancer.    I have advised her that she will need to make an appt with Dr Russella Dar to discuss.  She states she will call and make an appt at a later date.

## 2022-10-12 ENCOUNTER — Encounter: Payer: Self-pay | Admitting: Obstetrics and Gynecology

## 2022-10-12 ENCOUNTER — Other Ambulatory Visit: Payer: Self-pay | Admitting: Obstetrics and Gynecology

## 2022-10-12 DIAGNOSIS — R3989 Other symptoms and signs involving the genitourinary system: Secondary | ICD-10-CM

## 2022-10-12 MED ORDER — DIMETHYL SULFOXIDE 50 % IS SOLN
50.0000 mL | Freq: Once | INTRAVESICAL | 0 refills | Status: AC
Start: 2022-10-12 — End: 2022-10-12

## 2022-10-12 NOTE — Progress Notes (Signed)
DMSO sent to pharmacy for patient to bring in for bladder installation.

## 2022-10-12 NOTE — Progress Notes (Unsigned)
Bladder Instillation: The patient was identified and verbally consented for the procedure. The urethra was prepped with Betadine x 3. A 16 Fr foley catheter was inserted and attached to a 60 mL syringe with the plunger removed. The medication was slowly poured into the bladder via the syringe and foley. The medication consisted of 20ml of Lidocaine 2%, 20mL of Bupivicaine 0.25%, 10,000 units/mL Heparin, 5mL Sodium Bicarbonate 8.4%. The foley was removed and the patient was asked to hold the liquids in her bladder for 30-60 minutes if possible.   Precautions were given and patient was instructed to call the office or on-call number for any concerns.  Theophile Harvie G Clarisse Rodriges, NP  

## 2022-10-13 ENCOUNTER — Ambulatory Visit (INDEPENDENT_AMBULATORY_CARE_PROVIDER_SITE_OTHER): Payer: PPO | Admitting: Obstetrics and Gynecology

## 2022-10-13 ENCOUNTER — Encounter: Payer: Self-pay | Admitting: Obstetrics and Gynecology

## 2022-10-13 VITALS — BP 95/59 | HR 59

## 2022-10-13 DIAGNOSIS — R3989 Other symptoms and signs involving the genitourinary system: Secondary | ICD-10-CM | POA: Diagnosis not present

## 2022-10-13 MED ORDER — BUPIVACAINE HCL (PF) 0.25 % IJ SOLN
20.0000 mL | Freq: Once | INTRAMUSCULAR | Status: DC
Start: 2022-10-13 — End: 2023-03-16

## 2022-10-13 MED ORDER — HEPARIN SODIUM (PORCINE) 10000 UNIT/ML IJ SOLN
10000.0000 [IU] | Freq: Once | INTRAMUSCULAR | Status: AC
Start: 2022-10-13 — End: 2022-10-13
  Administered 2022-10-13: 10000 [IU] via INTRAVESICAL

## 2022-10-13 MED ORDER — SODIUM BICARBONATE 8.4 % IV SOLN
5.0000 mL | Freq: Once | INTRAVENOUS | Status: AC
Start: 2022-10-13 — End: 2022-10-13
  Administered 2022-10-13: 5 mL

## 2022-10-13 MED ORDER — LIDOCAINE HCL 2 % IJ SOLN
20.0000 mL | Freq: Once | INTRAMUSCULAR | Status: AC
Start: 2022-10-13 — End: 2022-10-13
  Administered 2022-10-13: 400 mg

## 2022-10-14 ENCOUNTER — Encounter: Payer: Self-pay | Admitting: Obstetrics and Gynecology

## 2022-10-14 ENCOUNTER — Encounter: Payer: Self-pay | Admitting: Nurse Practitioner

## 2022-10-18 NOTE — Progress Notes (Unsigned)
Bladder Instillation: The patient was identified and verbally consented for the procedure. The urethra was prepped with Betadine x 3. A 16 Fr foley catheter was inserted and attached to a 60 mL syringe with the plunger removed. The medication was slowly poured into the bladder via the syringe and foley. The medication consisted of 20ml of Lidocaine 2%, 20mL of Bupivicaine 0.25%, 10,000 units/mL Heparin, 5mL Sodium Bicarbonate 8.4%. The foley was removed and the patient was asked to hold the liquids in her bladder for 30-60 minutes if possible.   Precautions were given and patient was instructed to call the office or on-call number for any concerns.  Remmy Riffe G Enes Wegener, NP  

## 2022-10-19 ENCOUNTER — Encounter: Payer: Self-pay | Admitting: Obstetrics and Gynecology

## 2022-10-19 ENCOUNTER — Ambulatory Visit (INDEPENDENT_AMBULATORY_CARE_PROVIDER_SITE_OTHER): Payer: PPO | Admitting: Obstetrics and Gynecology

## 2022-10-19 VITALS — BP 95/64 | HR 59

## 2022-10-19 DIAGNOSIS — N3010 Interstitial cystitis (chronic) without hematuria: Secondary | ICD-10-CM | POA: Diagnosis not present

## 2022-10-19 DIAGNOSIS — R3989 Other symptoms and signs involving the genitourinary system: Secondary | ICD-10-CM

## 2022-10-19 MED ORDER — BUPIVACAINE HCL 0.25 % IJ SOLN
20.0000 mL | Freq: Once | INTRAMUSCULAR | Status: AC
Start: 2022-10-19 — End: 2022-10-19
  Administered 2022-10-19: 20 mL

## 2022-10-19 MED ORDER — LIDOCAINE HCL 2 % IJ SOLN
20.0000 mL | Freq: Once | INTRAMUSCULAR | Status: AC
Start: 2022-10-19 — End: 2022-10-19
  Administered 2022-10-19: 400 mg

## 2022-10-19 MED ORDER — HEPARIN SODIUM (PORCINE) 10000 UNIT/ML IJ SOLN
10000.0000 [IU] | Freq: Once | INTRAMUSCULAR | Status: AC
Start: 2022-10-19 — End: 2022-10-19
  Administered 2022-10-19: 10000 [IU] via INTRAVESICAL

## 2022-10-19 MED ORDER — LIDOCAINE HCL URETHRAL/MUCOSAL 2 % EX GEL
1.0000 | Freq: Once | CUTANEOUS | Status: AC
Start: 2022-10-19 — End: 2022-10-19
  Administered 2022-10-19: 1 via URETHRAL

## 2022-10-19 MED ORDER — SODIUM BICARBONATE 8.4 % IV SOLN
5.0000 mL | Freq: Once | INTRAVENOUS | Status: AC
Start: 2022-10-19 — End: 2022-10-19
  Administered 2022-10-19: 5 mL

## 2022-10-19 NOTE — Addendum Note (Signed)
Addended by: Thressa Sheller on: 10/19/2022 02:14 PM   Modules accepted: Orders

## 2022-10-21 ENCOUNTER — Encounter: Payer: Self-pay | Admitting: Obstetrics and Gynecology

## 2022-10-26 ENCOUNTER — Ambulatory Visit: Payer: PPO | Admitting: Obstetrics and Gynecology

## 2022-11-01 ENCOUNTER — Encounter: Payer: Self-pay | Admitting: Obstetrics and Gynecology

## 2022-11-02 ENCOUNTER — Ambulatory Visit: Payer: PPO | Admitting: Obstetrics and Gynecology

## 2022-11-09 ENCOUNTER — Ambulatory Visit: Payer: PPO | Admitting: Internal Medicine

## 2022-11-12 ENCOUNTER — Other Ambulatory Visit: Payer: Self-pay | Admitting: Nurse Practitioner

## 2022-11-12 ENCOUNTER — Other Ambulatory Visit: Payer: Self-pay

## 2022-11-12 ENCOUNTER — Other Ambulatory Visit (HOSPITAL_COMMUNITY): Payer: Self-pay

## 2022-11-12 MED ORDER — SERTRALINE HCL 100 MG PO TABS
100.0000 mg | ORAL_TABLET | Freq: Every day | ORAL | 3 refills | Status: DC
  Filled 2022-11-12: qty 90, 90d supply, fill #0

## 2022-11-13 ENCOUNTER — Other Ambulatory Visit (HOSPITAL_COMMUNITY): Payer: Self-pay

## 2022-11-19 ENCOUNTER — Ambulatory Visit: Payer: PPO

## 2022-11-19 ENCOUNTER — Encounter: Payer: Self-pay | Admitting: Obstetrics and Gynecology

## 2022-11-19 MED ORDER — CIPROFLOXACIN HCL 250 MG PO TABS: 250.0000 mg | ORAL_TABLET | Freq: Two times a day (BID) | ORAL | 0 refills | Status: AC

## 2022-12-08 ENCOUNTER — Encounter: Payer: Self-pay | Admitting: Obstetrics and Gynecology

## 2022-12-09 ENCOUNTER — Telehealth: Payer: Self-pay

## 2022-12-09 NOTE — Telephone Encounter (Signed)
[name] is a 72 y.o. female called in for a referral to a specialist to have her hemorrhoids looked at. Pt said they have become very painful and she does not know if Dr. Florian Buff treats them

## 2022-12-09 NOTE — Telephone Encounter (Signed)
Pt was scheduled ad will f/u with her pcp for the hemorrhoids

## 2022-12-11 ENCOUNTER — Other Ambulatory Visit: Payer: Self-pay

## 2022-12-11 ENCOUNTER — Other Ambulatory Visit: Payer: Self-pay | Admitting: Obstetrics and Gynecology

## 2022-12-11 ENCOUNTER — Other Ambulatory Visit: Payer: Self-pay | Admitting: Nurse Practitioner

## 2022-12-11 ENCOUNTER — Other Ambulatory Visit (HOSPITAL_COMMUNITY): Payer: Self-pay

## 2022-12-11 DIAGNOSIS — K580 Irritable bowel syndrome with diarrhea: Secondary | ICD-10-CM

## 2022-12-15 ENCOUNTER — Other Ambulatory Visit: Payer: Self-pay | Admitting: Obstetrics and Gynecology

## 2022-12-15 DIAGNOSIS — N390 Urinary tract infection, site not specified: Secondary | ICD-10-CM

## 2022-12-16 ENCOUNTER — Encounter: Payer: Self-pay | Admitting: Nurse Practitioner

## 2022-12-16 ENCOUNTER — Other Ambulatory Visit (HOSPITAL_COMMUNITY): Payer: Self-pay

## 2022-12-16 ENCOUNTER — Ambulatory Visit (INDEPENDENT_AMBULATORY_CARE_PROVIDER_SITE_OTHER): Payer: PPO | Admitting: Nurse Practitioner

## 2022-12-16 VITALS — BP 92/58 | HR 64 | Temp 97.7°F | Ht 63.0 in | Wt 167.8 lb

## 2022-12-16 DIAGNOSIS — M6289 Other specified disorders of muscle: Secondary | ICD-10-CM

## 2022-12-16 DIAGNOSIS — I700 Atherosclerosis of aorta: Secondary | ICD-10-CM

## 2022-12-16 DIAGNOSIS — J432 Centrilobular emphysema: Secondary | ICD-10-CM

## 2022-12-16 DIAGNOSIS — Z79899 Other long term (current) drug therapy: Secondary | ICD-10-CM | POA: Diagnosis not present

## 2022-12-16 DIAGNOSIS — K582 Mixed irritable bowel syndrome: Secondary | ICD-10-CM

## 2022-12-16 DIAGNOSIS — I5032 Chronic diastolic (congestive) heart failure: Secondary | ICD-10-CM

## 2022-12-16 DIAGNOSIS — Z86010 Personal history of colonic polyps: Secondary | ICD-10-CM

## 2022-12-16 DIAGNOSIS — I10 Essential (primary) hypertension: Secondary | ICD-10-CM

## 2022-12-16 DIAGNOSIS — K5730 Diverticulosis of large intestine without perforation or abscess without bleeding: Secondary | ICD-10-CM

## 2022-12-16 DIAGNOSIS — R300 Dysuria: Secondary | ICD-10-CM

## 2022-12-16 DIAGNOSIS — E039 Hypothyroidism, unspecified: Secondary | ICD-10-CM | POA: Diagnosis not present

## 2022-12-16 DIAGNOSIS — N3010 Interstitial cystitis (chronic) without hematuria: Secondary | ICD-10-CM

## 2022-12-16 DIAGNOSIS — R102 Pelvic and perineal pain: Secondary | ICD-10-CM

## 2022-12-16 DIAGNOSIS — G43909 Migraine, unspecified, not intractable, without status migrainosus: Secondary | ICD-10-CM | POA: Diagnosis not present

## 2022-12-16 DIAGNOSIS — K649 Unspecified hemorrhoids: Secondary | ICD-10-CM

## 2022-12-16 DIAGNOSIS — F429 Obsessive-compulsive disorder, unspecified: Secondary | ICD-10-CM | POA: Diagnosis not present

## 2022-12-16 DIAGNOSIS — I447 Left bundle-branch block, unspecified: Secondary | ICD-10-CM | POA: Diagnosis not present

## 2022-12-16 DIAGNOSIS — E782 Mixed hyperlipidemia: Secondary | ICD-10-CM

## 2022-12-16 DIAGNOSIS — N1831 Chronic kidney disease, stage 3a: Secondary | ICD-10-CM

## 2022-12-16 DIAGNOSIS — E559 Vitamin D deficiency, unspecified: Secondary | ICD-10-CM

## 2022-12-16 DIAGNOSIS — R7309 Other abnormal glucose: Secondary | ICD-10-CM | POA: Diagnosis not present

## 2022-12-16 DIAGNOSIS — F3342 Major depressive disorder, recurrent, in full remission: Secondary | ICD-10-CM

## 2022-12-16 DIAGNOSIS — M8580 Other specified disorders of bone density and structure, unspecified site: Secondary | ICD-10-CM

## 2022-12-16 DIAGNOSIS — Z87891 Personal history of nicotine dependence: Secondary | ICD-10-CM

## 2022-12-16 MED ORDER — CIPROFLOXACIN HCL 250 MG PO TABS: ORAL_TABLET | ORAL | 0 refills | Status: DC

## 2022-12-16 MED ORDER — HYDROCORTISONE ACETATE 25 MG RE SUPP: RECTAL | 3 refills | Status: DC

## 2022-12-16 MED ORDER — PREDNISONE 10 MG PO TABS
ORAL_TABLET | ORAL | 0 refills | Status: DC
Start: 2022-12-16 — End: 2023-01-06

## 2022-12-16 NOTE — Patient Instructions (Signed)
Pyelonephritis, Adult Pyelonephritis is an infection that occurs in the kidney. The kidneys are the organs that filter the blood and move waste from the bloodstream to the urine. Urine passes out of the kidneys through tubes called ureters and goes into the bladder. There are two main types of this condition: Acute pyelonephritis. These are infections that come on quickly and without any warning. Chronic pyelonephritis. These infections last for a long time. In most cases, the infection clears up with treatment. In more severe cases, an infection can spread to the bloodstream or lead to other problems with the kidneys. What are the causes? This condition is often caused by bacteria. The bacteria may travel: From the bladder up to the kidney. This may happen after you have a bladder infection (cystitis) or urinary tract infection (UTI). From the bloodstream to the kidney. What increases the risk? You are more likely to develop this condition if: You are female. Your risk is even higher if you are pregnant. You are older. You have: Diabetes. Prostatitis. This is inflammation of the prostate gland. Kidney stones or bladder stones. Problems with your kidneys or ureters. Cancer. Spinal cord injury or nerve damage around the bladder. You have a soft tube (catheter) placed in your bladder. You are sexually active and use spermicides. You have or have had a UTI. What are the signs or symptoms? Symptoms of this condition include: An urge to urinate that is strong or does not go away. You may also urinate more often than normal. A burning or stinging feeling when you urinate. Pain. This may be in your abdomen, back, side, or groin. Fever or chills. Nausea or vomiting. Urine that is bloody, dark, cloudy, or smells bad. How is this diagnosed? This condition may be diagnosed based on your medical history and a physical exam. You may also have tests, such as: Urine tests. Blood tests. Imaging  tests of the kidneys. These may include an ultrasound or CT scan. How is this treated? Treatment for this condition depends on the severity of the infection. If the infection is mild and found early, you may be given antibiotics to take by mouth. You will need to drink lots of fluids. If the infection is more severe, you may need to stay in the hospital and receive antibiotics through an IV. You may also get fluids through an IV. After you leave the hospital, you may need to take antibiotics by mouth. Other treatments may be needed. These will depend on the cause of the infection. Follow these instructions at home: Eating and drinking Drink enough fluid to keep your urine pale yellow. Avoid caffeine, tea, and carbonated drinks. These can irritate the bladder. General instructions Take over-the-counter and prescription medicines as told by your health care provider. Finish your antibiotics even if you start to feel better. Urinate often. Avoid holding in urine for long periods of time. Urinate before and after sex. If you are female, cleanse from front to back after a bowel movement. Use each tissue only once. Keep all follow-up visits. Your health care provider will want to make sure your infection is gone. Contact a health care provider if: Your symptoms do not get better after 2 days of treatment. Your symptoms get worse. You have a fever or chills. You cannot take your antibiotics. Get help right away if: You vomit each time that you eat or drink. You have severe pain in your back or side. You are very weak, or you faint. This information is  not intended to replace advice given to you by your health care provider. Make sure you discuss any questions you have with your health care provider. Document Revised: 11/03/2021 Document Reviewed: 11/03/2021 Elsevier Patient Education  2024 [employer].

## 2022-12-16 NOTE — Progress Notes (Signed)
FOLLOW UP   Assessment:   Diagnoses and all orders for this visit:  Atherosclerosis of aorta (HCC) - Per Ct 2020 Control blood pressure, cholesterol, glucose, increase exercise.  Declines sx or cardiology referral/workup Will do medical management; continue bASA, no longer smoking Discussed statin, plaque stabilizing benefit -  receptive to low dose low frequency - rosuvastatin 5 mg 3 nights a week to start   Centrilobular emphysema (HCC) Per CT 2020, denies sx, monitor   Essential hypertension Discussed DASH (Dietary Approaches to Stop Hypertension) DASH diet is lower in sodium than a typical American diet. Cut back on foods that are high in saturated fat, cholesterol, and trans fats. Eat more whole-grain foods, fish, poultry, and nuts Remain active and exercise as tolerated daily.  Monitor BP at home-Call if greater than 130/80.  Check CMP/CBC  LBBB (left bundle branch block) Continue to monitor Cardiology Dr. Elease Hashimoto PRN per patient preference  Migraine without status migrainosus, not intractable, unspecified migraine type Resolved Remain well hydrated  Irritable bowel syndrome - mixed Symptoms stable/improved  Continue monitoring diet/lifestyle modification Suggest probiotic  Hypothyroidism, unspecified type Controlled. Continue Levothyroxine. Reminded to take on an empty stomach 30-14mins before food.  Stop any Biotin Supplement 48-72 hours before next TSH level to reduce the risk of falsely low TSH levels. Continue to monitor.     CKD Stage 3 (HCC) Discussed how what you eat and drink can aide in kidney protection. Stay well hydrated. Avoid high salt foods. Avoid NSAIDS. Keep BP and BG well controlled.   Take medications as prescribed. Remain active and exercise as tolerated daily. Maintain weight.  Continue to monitor. Check CMP/GFR/Microablumin  Major depression, recurrent, full remission (HCC) Grief counseling offered - patient declines, plans to  continue family support. Continue medications; rare benzo; doing well with current regimen, Zoloft, Continue Wellbutrin. Lifestyle discussed: diet/exerise, sleep hygiene, stress management, hydration  Obsessive-compulsive disorder, unspecified type Symptoms improved on SSRI - Continue Zoloft  Diverticulosis of intestine without bleeding, unspecified intestinal tract location Increase fiber intake Stay well hydrated Continue to monitor  Medication management All medications discussed and reviewed in full. All questions and concerns regarding medications addressed.    Mixed hyperlipidemia Discussed lifestyle modifications. Recommended diet heavy in fruits and veggies, omega 3's. Decrease consumption of animal meats, cheeses, and dairy products. Remain active and exercise as tolerated. Continue to monitor. Check lipids/TSH  Other abnormal glucose Education: Reviewed 'ABCs' of diabetes management  Discussed goals to be met and/or maintained include A1C (<7) Blood pressure (<130/80) Cholesterol (LDL <70) Continue Eye Exam yearly  Continue Dental Exam Q6 mo Discussed dietary recommendations Discussed Physical Activity recommendations Check A1C  Vitamin D deficiency Continue supplement for goal of 60-100 Monitor Vitamin D levels  Osteopenia Continue Bone Density screening Q2 Years Pursue a combination of weight-bearing exercises and strength training. Advised on fall prevention measures including proper lighting in all rooms, removal of area rugs and floor clutter, use of walking devices as deemed appropriate, avoidance of uneven walking surfaces. Smoking cessation and moderate alcohol consumption if applicable Consume 800 to 1000 IU of vitamin D daily with a goal vitamin D serum value of 30 ng/mL or higher. Aim for 1000 to 1200 mg of elemental calcium daily through supplements and/or dietary sources.   CHF, diastolic (HCC) Per ECHO while admitted 02/2019 No hx prior,  denies notable sx since that time Dr. Eden Emms recommended no dedicated follow up unless new sx Limit sodium; monitor weight and control BP  Hx of colon polyps  Next colonoscopy due 10/2025 High fiber diet, low red meat/processed meat  Former smoker, 40 pack year, quit 2004/Emphysema CT chest 08/2020 was benign, no further per radiology  Low threshold for CXR with sx, denies today   IC/pelvic floor dysfunction/vaginal pain Start Prednisone taper Uro/Gyn following Botox not effective Limited benefit with pelvic floor PT  Dysuria Start Ciprofloxacin as directed Stay well hydrated to keep urinary system well flushed Consider cranberry supplement Will check UA today   Hemorrhoids Start Anusol as directed. Cotninue to keep stool soft Stay well hydrated.  Orders Placed This Encounter  Procedures   Urine Culture   CBC with Differential/Platelet   COMPLETE METABOLIC PANEL WITH GFR   Lipid panel   Hemoglobin A1c   Urinalysis, Routine w reflex microscopic   Meds ordered this encounter  Medications   hydrocortisone (ANUSOL-HC) 25 MG suppository    Sig: Insert 1 rectal suppository 2 x /day    Dispense:  12 suppository    Refill:  3    Order Specific Question:   Supervising Provider    Answer:   Lucky Cowboy [[id number]]   predniSONE (DELTASONE) 10 MG tablet    Sig: 1 tab 3 x day for 2 days, then 1 tab 2 x day for 2 days, then 1 tab 1 x day for 3 days    Dispense:  13 tablet    Refill:  0    Order Specific Question:   Supervising Provider    Answer:   Lucky Cowboy [phone]   ciprofloxacin (CIPRO) 250 MG tablet    Sig: Take 1 tablet 2 x /day with Food for Infection    Dispense:  14 tablet    Refill:  0    Order Specific Question:   Supervising Provider    Answer:   Lucky Cowboy [phone]   Notify office for further evaluation and treatment, questions or concerns if any reported s/s fail to improve.   The patient was advised to call back or seek an in-person evaluation if  any symptoms worsen or if the condition fails to improve as anticipated.   Further disposition pending results of labs. Discussed med's effects and SE's.    I discussed the assessment and treatment plan with the patient. The patient was provided an opportunity to ask questions and all were answered. The patient agreed with the plan and demonstrated an understanding of the instructions.  Discussed med's effects and SE's. Screening labs and tests as requested with regular follow-up as recommended.  I provided 25 minutes of face-to-face time during this encounter including counseling, chart review, and critical decision making was preformed.  Today's Plan of Care is based on a patient-centered health care approach known as shared decision making - the decisions, tests and treatments allow for patient preferences and values to be balanced with clinical evidence.     Future Appointments  Date Time Provider Department Center  01/06/2023  8:20 AM Marguerita Beards, MD [facility]  06/17/2023 10:00 AM Adela Glimpse, NP [facility] None  09/15/2023 11:00 AM Adela Glimpse, NP [facility] None    Subjective:  [name] is a 72 y.o. female who presents for a general follow up. She has Hypothyroidism; Major depression, recurrent, full remission (HCC); OCD (obsessive compulsive disorder); Abnormal glucose; Migraines; Vitamin D deficiency; LBBB (left bundle branch block); HTN (hypertension); Hyperlipidemia, mixed; Medication management; CKD Stage 3 (HCC); IBS ; History of colon polyps; Osteopenia; Chronic diastolic CHF (congestive heart failure) (HCC); Atherosclerosis of aorta (HCC) by Chest  CT scan in 2020 ; Emphysema lung (HCC) - per CT 03/09/2019; Former smoker (40 pack year, quit 2004); Overweight (BMI 25.0-29.9); Interstitial cystitis; and Coronary atherosclerosis of native coronary artery (per CTA 01/31/2021)  on their problem list.  She is continuing to worry about her youngest son, 47 yo has  been diagnosed with rectal cancer stage IV.  Currently undergoing chemo and radiation in [place].  She continues to be in the grieving and acceptance stage.  Not sure if she has accepted what is going on, unable to cry.  She is Wellbutrin and Sertraline.  She defers grief counseling, feels as though she has good family support.  Denies extreme mood swings, HI/SI.  She has a diagnosis of depression/anxiety/OCD.  Of note she was see in the ER on 09/11/22 for dizziness, stroke work up which was negative.  Feels as though it could be r/t the first botox injection she received for incontinence.  She continues to self cath.  She is unsure if it is r/t overall stress of son. She has IC, and pelvic floor dysfunction, follows  urogyn Dr. Oleh Genin, has been doing pelvic floor PT with limited benefit. She has continued to have pain after trying Botox injections.  Today she continues to have lower pelvic pain. She also continues to have symptoms of dysuria, although she has a hx of interstitial cystitis.  She has used an in home AZO test that has shown positive for UTI.    She is also concerned with hemorrhoids.  States she has internal and external.  They are inflamed and painful.  Making it difficult for her to sit. She tries to stay well hydrated.  She takes Miralax which helps to keep her stools soft.  She has been using Preparation H without relief.    She had bil inguinal hernia repair by Dr. Michaell Cowing 04/10/2021, doing well with no issues.   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   BMI is Body mass index is 29.72 kg/m., she has not been working on diet. Wt Readings from Last 3 Encounters:  12/16/22 167 lb 12.8 oz (76.1 kg)  09/15/22 158 lb (71.7 kg)  09/11/22 165 lb (74.8 kg)   She has aortic atherosclerosis and centrilobular emphysema per CT chest 02/2019, former smoker, quit in  2004, 40 pack year history. Inhalers only need when ill. 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   In November 2020,  patient had removal if breast implants and had post-op complications post intubation neck & facial swelling with fever & drop in O2 to 79% and all attributed to secondary to cricopharyngeal inflammation.  At this time she underwent Echo 03/10/2019 LVEF estimated 55-60%, Abnormal (paradoxical) septal motion, consistent with left bundle branch block (long history of this on EKG), Grade I diastolic dysfunctoin, she also had some elevated troponins felt to be demand related. Had follow up with Dr. Eden Emms, was not recommended dedicated follow up. LBBB followed by Dr. Eden Emms.    Her blood pressure has been controlled at home however today their BP is BP: (!) 92/58.  She is no longer taking anti-hypertensive.  She does not workout but is doing lots of intense housework. She denies chest pain, shortness of breath, does endorse fatigue and intermittent dizziness.   She does have  aortic atherosclerosis and coronary atherosclerosis per CTA 01/31/2021. Denies angina, dyspnea. She is on bASA, BB. She is currently not on statin.   She is not on cholesterol medication, mild elevations treated by lifestyle modification, has been declining lipid program and no longer on statin. Her cholesterol is not at goal. The cholesterol last visit was:   Lab Results  Component Value Date   CHOL 166 09/15/2022   HDL 64 09/15/2022   LDLCALC 78 09/15/2022   TRIG 143 09/15/2022   CHOLHDL 2.6 09/15/2022    She has been working on diet and exercise for glucose management, and denies increased appetite, nausea, paresthesia of the feet, polydipsia, polyuria, visual disturbances and vomiting. Last A1C in the office was:  Lab Results  Component Value Date   HGBA1C 5.7 (H) 09/15/2022   She is on thyroid medication. Her medication was not changed last  visit, taking 125 mcg daily Lab Results  Component Value Date   TSH 2.60 09/15/2022   She is followed by nephrology Dr. Dierdre Highman for CKD III Lab Results  Component Value Date   EGFR 32 (L) 09/15/2022   Patient is on Vitamin D supplement and at goal at recent check:  Lab Results  Component Value Date   VD25OH 78 09/15/2022     Stopped B complex supplement taking irregularly Lab Results  Component Value Date   VITAMINB12 1,001 06/13/2021     Medication Review: Current Outpatient Medications on File Prior to Visit  Medication Sig Dispense Refill   ALPRAZolam (XANAX) 0.5 MG tablet TAKE 1/2 TO 1 TABLET ONCE A DAY IF NEEDED FOR SEVERE ANXIETY OR SLEEP. **Use sparingly to prevent addiction and tolerance. 30 tablet 0   aspirin EC 81 MG tablet Take 81 mg by mouth daily.      atenolol (TENORMIN) 50 MG tablet Take 50 mg by mouth daily.     buPROPion (WELLBUTRIN XL) 150 MG 24 hr tablet Take 3 tablets (450 mg total) by mouth daily for depression. 270 tablet 3   cetirizine (ZYRTEC) 10 MG tablet Take 10 mg by mouth daily.     Cholecalciferol (VITAMIN D-3) 5000 UNITS TABS Take 5,000 Units by mouth daily.      Cinnamon 500 MG capsule Take 500 mg by mouth daily.     diphenhydrAMINE (BENADRYL) 25 MG tablet Take 25 mg by mouth every 6 (six) hours as needed.     estradiol (ESTRACE) 0.1 MG/GM vaginal cream PLACE 1/2 GRAM VAGINALLY EVERY NIGHT AT BEDTIME FOR 2 WEEKS THEN USE 2 TIMES PER WEEK THEREAFTER 42.5 g 11   guaifenesin (HUMIBID E) 400 MG TABS tablet Take 400 mg by mouth daily.     hydrOXYzine (ATARAX) 25 MG tablet Take 1 tablet (25 mg total) by mouth at bedtime. 30 tablet 5   hyoscyamine (LEVSIN SL) 0.125 MG SL tablet DISSOLVE 1 TABLET UNDER THE TONGUE EVERY 4 HOURS IF NEEDED FOR NAUSEA, BLOATING, CRAMPING, OR DIARRHEA 120 tablet 3   ipratropium-albuterol (DUONEB) 0.5-2.5 (3) MG/3ML SOLN USE 1 VIAL IN NEBULIZER EVERY 4 HOURS AS NEEDED **MAX 6 DOSES A DAY** 540 mL 0   levothyroxine  (SYNTHROID) 100 MCG tablet Take  1 tablet  Daily  on an empty stomach with only water for 30 minutes & no Antacid meds, Calcium or Magnesium for 4 hours & avoid Biotin 90 tablet 3   lidocaine-prilocaine (EMLA) cream Apply 1 Application topically as needed. 30 g 0   lidocaine-prilocaine (EMLA) cream APPLY TO AFFECTED AREA(S) TO SKIN AS  NEEDED 30 g 11   ondansetron (ZOFRAN) 8 MG tablet TAKE 1 TABLET BY MOUTH 3 TIMES A DAY AS NEEDED FOR NAUSEA (Patient taking differently: Take 8 mg by mouth every 8 (eight) hours as needed. TAKE 1 TABLET BY MOUTH 3 TIMES A DAY AS NEEDED FOR NAUSEA) 60 tablet 2   pantoprazole (PROTONIX) 20 MG tablet TAKE 1 TABLET BY MOUTH DAILY 90 tablet 2   rosuvastatin (CRESTOR) 5 MG tablet Take  1 tablet 3 x /week for Cholesterol                                                         /                    TAKE                                     BY                                  MOUTH 39 tablet 3   sertraline (ZOLOFT) 100 MG tablet Take 1 tablet (100 mg total) by mouth daily for mood. 90 tablet 3   famotidine (PEPCID) 20 MG tablet TAKE 1 TABLET BY MOUTH TWICE DAILY FOR INDIGESTION AND HEARTBURN (Patient taking differently: Take 40 mg by mouth 2 (two) times daily.) 180 tablet 3   Current Facility-Administered Medications on File Prior to Visit  Medication Dose Route Frequency Provider Last Rate Last Admin   bupivacaine (PF) (MARCAINE) 0.25 % injection 20 mL  20 mL Other Once Zuleta, Kaitlin G, NP       bupivacaine (PF) (MARCAINE) 0.25 % injection 20 mL  20 mL Other Once Zuleta, Kaitlin G, NP       bupivacaine (PF) (MARCAINE) 0.25 % injection 20 mL  20 mL Other Once Selmer Dominion, NP        Allergies  Allergen Reactions   Ace Inhibitors Other (See Comments)    Acute renal failure (lisinopril)   Cephalexin Anaphylaxis   Dilaudid [Hydromorphone Hcl] Anaphylaxis   Robaxin [Methocarbamol] Anaphylaxis   Macrobid [Nitrofurantoin] Other (See Comments)    Causes hyperthermia     Levaquin [Levofloxacin] Nausea And Vomiting   Neurontin [Gabapentin]     Confusion    Codeine Nausea Only   Latex Rash   Sulfa Antibiotics Other (See Comments)    Do not take regularly per nephrology.  Can use for acute issues.     Current Problems (verified) Patient Active Problem List   Diagnosis Date Noted   Coronary atherosclerosis of native coronary artery (per CTA 01/31/2021)  06/14/2021   Interstitial cystitis 06/14/2020   Overweight (BMI 25.0-29.9) 06/13/2020   Former smoker (40 pack year, quit 2004) 08/30/2019   Atherosclerosis of aorta (HCC) by Chest CT scan in 2020  03/22/2019   Emphysema lung (HCC) - per CT 03/09/2019    Chronic diastolic CHF (congestive heart failure) (HCC) 03/11/2019   Osteopenia 01/16/2019   History of colon polyps 06/28/2018   CKD Stage 3 (HCC) 01/10/2015   IBS  01/10/2015   Hyperlipidemia, mixed 06/12/2014   Medication management 06/12/2014   LBBB (left bundle  branch block) 07/24/2013   HTN (hypertension) 07/24/2013   Major depression, recurrent, full remission (HCC)    OCD (obsessive compulsive disorder)    Abnormal glucose    Migraines    Vitamin D deficiency    Hypothyroidism 04/05/2013    Screening Tests Immunization History  Administered Date(s) Administered   Influenza Split 01/31/2016, 05/12/2018, 03/17/2019   Influenza, High Dose Seasonal PF 03/22/2017, 02/16/2018, 03/22/2019, 03/24/2021, 02/17/2022   Influenza-Unspecified 01/31/2016, 02/09/2020   PFIZER Comirnaty(Schake Top)Covid-19 Tri-Sucrose Vaccine 03/31/2021   PFIZER(Purple Top)SARS-COV-2 Vaccination 07/05/2019, 07/26/2019, 03/04/2020   Pneumococcal Conjugate-13 03/22/2017, 06/29/2018   Pneumococcal Polysaccharide-23 02/12/1999, 06/29/2018   Td 04/27/2013   Zoster Recombinant(Shingrix) 06/14/2020, 08/28/2020   Zoster, Live 11/24/2005   Health Maintenance  Topic Date Due   COVID-19 Vaccine (5 - 2023-24 season) 12/26/2021   INFLUENZA VACCINE  11/26/2022   DTaP/Tdap/Td (2  - Tdap) 04/28/2023   Medicare Annual Wellness (AWV)  09/15/2023   MAMMOGRAM  11/19/2023   DEXA SCAN  11/19/2023   Colonoscopy  11/14/2025   Pneumonia Vaccine 48+ Years old  Completed   Hepatitis C Screening  Completed   Zoster Vaccines- Shingrix  Completed   HPV VACCINES  Aged Out    Patient Care Team: Wendall Stade, MD as PCP - General (Cardiology) Wendall Stade, MD as PCP - Cardiology (Cardiology) Terrial Rhodes, MD as Consulting Physician (Nephrology) Manning Charity, OD as Referring Physician (Optometry) [name] Dare, MD as Consulting Physician (Gastroenterology) Wendall Stade, MD as Consulting Physician (Cardiology) Lajoyce Corners, [facility] (Inactive) as Pharmacist (Pharmacist) Vernon Prey, Candace (Gynecology)  SURGICAL HISTORY She  has a past surgical history that includes Tonsillectomy; Bunionectomy (Bilateral, 1997); Hemorrhoid surgery (1976); Breast enhancement surgery (Bilateral); ORIF ankle fracture (Left, 08/30/2017); Breast implant removal (Bilateral, 03/07/2019); Esophagogastroduodenoscopy (egd) with propofol (N/A, 03/12/2019); Colonoscopy (10/2020); Cataract extraction w/ intraocular lens implant (Right, 2005); Inguinal hernia repair (N/A, 04/10/2021); Anterior and posterior repair with sacrospinous fixation (N/A, 11/10/2021); Bladder suspension (N/A, 11/10/2021); and Cystoscopy with fulgeration (11/10/2021). FAMILY HISTORY Her family history includes Colon polyps in her mother; Heart block in her maternal grandmother; Hypertension in her sister; Muscular dystrophy in her maternal grandmother and mother. SOCIAL HISTORY She  reports that she quit smoking about 20 years ago. Her smoking use included cigarettes. She started smoking about 60 years ago. She has a 40 pack-year smoking history. She has never used smokeless tobacco. She reports current alcohol use. She reports that she does not use drugs.  Review of Systems  Constitutional:  Negative for malaise/fatigue and  weight loss.  HENT:  Negative for hearing loss and tinnitus.   Eyes:  Negative for blurred vision and double vision.  Respiratory:  Negative for cough, shortness of breath and wheezing.   Cardiovascular:  Negative for chest pain, palpitations, orthopnea, claudication and leg swelling.  Gastrointestinal:  Positive for diarrhea (improved, intermittent with trigger foods (lactose)). Negative for abdominal pain, blood in stool, constipation, heartburn, melena, nausea and vomiting.  Genitourinary: Negative.   Musculoskeletal:  Negative for falls, joint pain and myalgias.  Skin:  Negative for rash.  Neurological:  Negative for dizziness, tingling, sensory change, weakness and headaches.  Endo/Heme/Allergies:  Negative for polydipsia.  Psychiatric/Behavioral:  Negative for depression, memory loss, substance abuse and suicidal ideas. The patient is not nervous/anxious and does not have insomnia.   All other systems reviewed and are negative.    Objective:     Today's Vitals   12/16/22 1026  BP: (!) 92/58  Temp: 97.7 F (36.5 C)  Weight: 167 lb 12.8 oz (76.1 kg)  Height: 5\' 3"  (1.6 m)   Body mass index is 29.72 kg/m.  General appearance: alert, no distress, WD/WN, female HEENT: normocephalic, sclerae anicteric, TMs pearly, nares patent, no discharge or erythema, pharynx normal Oral cavity: MMM, no lesions Neck: supple, no lymphadenopathy, no thyromegaly, no masses Heart: RRR, normal S1, S2, no murmurs Lungs: CTA bilaterally, no wheezes, rhonchi, or rales Abdomen: +bs, soft, non tender, non distended, no masses, no hepatomegaly, no splenomegaly, no hernias. Well healed lap incision sites.  Musculoskeletal: nontender, no swelling, no obvious deformity Extremities: no edema, no cyanosis, no clubbing.  Pulses: 2+ symmetric, upper and lower extremities, normal cap refill Neurological: alert, oriented x 3, CN2-12 intact, strength normal upper extremities and lower extremities, sensation  normal throughout, DTRs 2+ throughout, no cerebellar signs, gait normal Psychiatric: normal affect, behavior normal, pleasant    Arvie Bartholomew, NP   12/16/2022

## 2022-12-17 ENCOUNTER — Encounter: Payer: Self-pay | Admitting: Nurse Practitioner

## 2022-12-17 LAB — CBC WITH DIFFERENTIAL/PLATELET
Absolute Monocytes: 469 {cells}/uL (ref 200–950)
Basophils Absolute: 62 {cells}/uL (ref 0–200)
Basophils Relative: 0.9 %
Eosinophils Absolute: 628 {cells}/uL — ABNORMAL HIGH (ref 15–500)
Eosinophils Relative: 9.1 %
HCT: 37.9 % (ref 35.0–45.0)
Hemoglobin: 12.3 g/dL (ref 11.7–15.5)
Lymphs Abs: 1628 {cells}/uL (ref 850–3900)
MCH: 30.4 pg (ref 27.0–33.0)
MCHC: 32.5 g/dL (ref 32.0–36.0)
MCV: 93.6 fL (ref 80.0–100.0)
MPV: 10.4 fL (ref 7.5–12.5)
Monocytes Relative: 6.8 %
Neutro Abs: 4112 {cells}/uL (ref 1500–7800)
Neutrophils Relative %: 59.6 %
Platelets: 305 10*3/uL (ref 140–400)
RBC: 4.05 10*6/uL (ref 3.80–5.10)
RDW: 12.6 % (ref 11.0–15.0)
Total Lymphocyte: 23.6 %
WBC: 6.9 10*3/uL (ref 3.8–10.8)

## 2022-12-17 LAB — URINE CULTURE
MICRO NUMBER:: 15363796
SPECIMEN QUALITY:: ADEQUATE

## 2022-12-17 LAB — COMPLETE METABOLIC PANEL WITH GFR
AG Ratio: 1.6 (calc) (ref 1.0–2.5)
ALT: 12 U/L (ref 6–29)
AST: 16 U/L (ref 10–35)
Albumin: 3.9 g/dL (ref 3.6–5.1)
Alkaline phosphatase (APISO): 59 U/L (ref 37–153)
BUN/Creatinine Ratio: 20 (calc) (ref 6–22)
BUN: 32 mg/dL — ABNORMAL HIGH (ref 7–25)
CO2: 28 mmol/L (ref 20–32)
Calcium: 9.7 mg/dL (ref 8.6–10.4)
Chloride: 103 mmol/L (ref 98–110)
Creat: 1.58 mg/dL — ABNORMAL HIGH (ref 0.60–1.00)
Globulin: 2.5 g/dL (ref 1.9–3.7)
Glucose, Bld: 92 mg/dL (ref 65–99)
Potassium: 5.3 mmol/L (ref 3.5–5.3)
Sodium: 139 mmol/L (ref 135–146)
Total Bilirubin: 0.3 mg/dL (ref 0.2–1.2)
Total Protein: 6.4 g/dL (ref 6.1–8.1)
eGFR: 35 mL/min/{1.73_m2} — ABNORMAL LOW (ref >=60)

## 2022-12-17 LAB — URINALYSIS, ROUTINE W REFLEX MICROSCOPIC
Bilirubin Urine: NEGATIVE
Glucose, UA: NEGATIVE
Ketones, ur: NEGATIVE
Nitrite: NEGATIVE
Specific Gravity, Urine: 1.014 (ref 1.001–1.035)
Squamous Epithelial / HPF: NONE SEEN /HPF (ref <=5)
WBC, UA: >=60 /HPF — AB (ref 0–5)
pH: 6.5 (ref 5.0–8.0)

## 2022-12-17 LAB — MICROSCOPIC MESSAGE

## 2022-12-17 LAB — HEMOGLOBIN A1C
Hgb A1c MFr Bld: 5.7 %{Hb} — ABNORMAL HIGH (ref <5.7)
Mean Plasma Glucose: 117 mg/dL
eAG (mmol/L): 6.5 mmol/L

## 2022-12-17 LAB — LIPID PANEL
Cholesterol: 197 mg/dL (ref <200)
HDL: 67 mg/dL (ref >=50)
LDL Cholesterol (Calc): 109 mg/dL — ABNORMAL HIGH
Non-HDL Cholesterol (Calc): 130 mg/dL — ABNORMAL HIGH (ref <130)
Total CHOL/HDL Ratio: 2.9 (calc) (ref <5.0)
Triglycerides: 103 mg/dL (ref <150)

## 2022-12-17 MED ORDER — ONDANSETRON 4 MG PO TBDP: 4.0000 mg | ORAL_TABLET | Freq: Three times a day (TID) | ORAL | 0 refills | Status: DC | PRN

## 2022-12-18 ENCOUNTER — Encounter: Payer: Self-pay | Admitting: Nurse Practitioner

## 2022-12-18 MED ORDER — BUSPIRONE HCL 5 MG PO TABS: ORAL_TABLET | ORAL | 0 refills | Status: DC

## 2022-12-24 ENCOUNTER — Emergency Department (HOSPITAL_BASED_OUTPATIENT_CLINIC_OR_DEPARTMENT_OTHER): Payer: PPO

## 2022-12-24 ENCOUNTER — Other Ambulatory Visit: Payer: Self-pay

## 2022-12-24 ENCOUNTER — Emergency Department (HOSPITAL_BASED_OUTPATIENT_CLINIC_OR_DEPARTMENT_OTHER)
Admission: EM | Admit: 2022-12-24 | Discharge: 2022-12-24 | Disposition: A | Discharge status: Home / Self Care | Payer: PPO | Attending: Emergency Medicine | Admitting: Emergency Medicine

## 2022-12-24 DIAGNOSIS — D72829 Elevated white blood cell count, unspecified: Secondary | ICD-10-CM | POA: Diagnosis not present

## 2022-12-24 DIAGNOSIS — R102 Pelvic and perineal pain: Secondary | ICD-10-CM

## 2022-12-24 DIAGNOSIS — N898 Other specified noninflammatory disorders of vagina: Secondary | ICD-10-CM | POA: Diagnosis not present

## 2022-12-24 DIAGNOSIS — Z7982 Long term (current) use of aspirin: Secondary | ICD-10-CM | POA: Diagnosis not present

## 2022-12-24 DIAGNOSIS — Z91040 Latex allergy status: Secondary | ICD-10-CM | POA: Insufficient documentation

## 2022-12-24 DIAGNOSIS — N3010 Interstitial cystitis (chronic) without hematuria: Secondary | ICD-10-CM | POA: Diagnosis not present

## 2022-12-24 DIAGNOSIS — N3001 Acute cystitis with hematuria: Secondary | ICD-10-CM | POA: Insufficient documentation

## 2022-12-24 DIAGNOSIS — N189 Chronic kidney disease, unspecified: Secondary | ICD-10-CM | POA: Insufficient documentation

## 2022-12-24 DIAGNOSIS — K449 Diaphragmatic hernia without obstruction or gangrene: Secondary | ICD-10-CM | POA: Diagnosis not present

## 2022-12-24 LAB — URINALYSIS, ROUTINE W REFLEX MICROSCOPIC
Bacteria, UA: NONE SEEN
Bilirubin Urine: NEGATIVE
Glucose, UA: NEGATIVE mg/dL
Ketones, ur: NEGATIVE mg/dL
Nitrite: NEGATIVE
Protein, ur: 30 mg/dL — AB
RBC / HPF: >50 RBC/hpf (ref 0–5)
Specific Gravity, Urine: 1.024 (ref 1.005–1.030)
WBC, UA: >50 WBC/hpf (ref 0–5)
pH: 7 (ref 5.0–8.0)

## 2022-12-24 LAB — COMPREHENSIVE METABOLIC PANEL
ALT: 13 U/L (ref 0–44)
AST: 15 U/L (ref 15–41)
Albumin: 3.6 g/dL (ref 3.5–5.0)
Alkaline Phosphatase: 54 U/L (ref 38–126)
Anion gap: 8 (ref 5–15)
BUN: 26 mg/dL — ABNORMAL HIGH (ref 8–23)
CO2: 29 mmol/L (ref 22–32)
Calcium: 9.1 mg/dL (ref 8.9–10.3)
Chloride: 101 mmol/L (ref 98–111)
Creatinine, Ser: 1.54 mg/dL — ABNORMAL HIGH (ref 0.44–1.00)
GFR, Estimated: 36 mL/min — ABNORMAL LOW (ref >60)
Glucose, Bld: 93 mg/dL (ref 70–99)
Potassium: 4.4 mmol/L (ref 3.5–5.1)
Sodium: 138 mmol/L (ref 135–145)
Total Bilirubin: 0.5 mg/dL (ref 0.3–1.2)
Total Protein: 6.7 g/dL (ref 6.5–8.1)

## 2022-12-24 LAB — CBC
HCT: 40 % (ref 36.0–46.0)
Hemoglobin: 13 g/dL (ref 12.0–15.0)
MCH: 30.6 pg (ref 26.0–34.0)
MCHC: 32.5 g/dL (ref 30.0–36.0)
MCV: 94.1 fL (ref 80.0–100.0)
Platelets: 355 10*3/uL (ref 150–400)
RBC: 4.25 MIL/uL (ref 3.87–5.11)
RDW: 13.7 % (ref 11.5–15.5)
WBC: 7.9 10*3/uL (ref 4.0–10.5)
nRBC: 0 % (ref 0.0–0.2)

## 2022-12-24 MED ORDER — LACTATED RINGERS IV BOLUS
1000.0000 mL | Freq: Once | INTRAVENOUS | Status: AC
  Administered 2022-12-24: 1000 mL via INTRAVENOUS

## 2022-12-24 MED ORDER — ACETAMINOPHEN 500 MG PO TABS
1000.0000 mg | ORAL_TABLET | Freq: Once | ORAL | Status: AC
  Administered 2022-12-24: 1000 mg via ORAL
  Filled 2022-12-24: qty 2

## 2022-12-24 MED ORDER — NITROFURANTOIN MONOHYD MACRO 100 MG PO CAPS: 100.0000 mg | ORAL_CAPSULE | Freq: Two times a day (BID) | ORAL | 0 refills | Status: DC

## 2022-12-24 MED ORDER — TRAMADOL HCL 50 MG PO TABS
50.0000 mg | ORAL_TABLET | Freq: Four times a day (QID) | ORAL | 0 refills | Status: AC | PRN
Start: 2022-12-24 — End: 2022-12-29

## 2022-12-24 MED ORDER — TRAMADOL HCL 50 MG PO TABS
50.0000 mg | ORAL_TABLET | Freq: Once | ORAL | Status: AC
  Administered 2022-12-24: 50 mg via ORAL
  Filled 2022-12-24: qty 1

## 2022-12-24 MED ORDER — IOHEXOL 300 MG/ML  SOLN
100.0000 mL | Freq: Once | INTRAMUSCULAR | Status: AC | PRN
  Administered 2022-12-24: 85 mL via INTRAVENOUS

## 2022-12-24 NOTE — ED Triage Notes (Signed)
Pt states she has long hx of interstitial cystitis requiring mesh surgical procedures and botox. Intermittently gets pain to the area, but today pain is severe. Pt denies N/V/D, vaginal bleeding or discharge. States that she does have some slight rectal bleeding, but also has known hemorrhoids.

## 2022-12-24 NOTE — ED Provider Notes (Signed)
EMERGENCY DEPARTMENT AT [facility] Provider Note   CSN: [id number] Arrival date & time: 12/24/22  1320     History  Chief Complaint  Patient presents with   Pelvic Pain    [name] is a 72 y.o. female with history of interstitial cystitis, CKD, H. pylori, IBS, hemorrhoids, hysterectomy, anterior and posterior repair with sacrospinous fixation (N/A, 11/10/2021); Bladder suspension (N/A, 11/10/2021); and Cystoscopy with fulgeration (11/10/2021) who presents with concern for vaginal pain that has been worsening for the last couple months but much worse today.  Her pain started getting worse after her bladder Botox injection in April.  Today her pain is much worse and she feels like there is something bulging in her vaginal area that is lumpy.  Denies any vaginal bleeding.  Reports her bowel movements have been getting more thin over the past couple of months.  Last bowel movement was yesterday at 9 AM, and last full urination was this morning at 1 AM.  Denies any fevers or chills.  She is already taken naproxen, Excedrin, and Tylenol earlier today without relief from her pain.   Pelvic Pain       Home Medications Prior to Admission medications   Medication Sig Start Date End Date Taking? Authorizing Provider  nitrofurantoin, macrocrystal-monohydrate, (MACROBID) 100 MG capsule Take 1 capsule (100 mg total) by mouth 2 (two) times daily. 12/24/22  Yes Arabella Merles, PA-C  traMADol (ULTRAM) 50 MG tablet Take 1 tablet (50 mg total) by mouth every 6 (six) hours as needed for up to 5 days for moderate pain or severe pain. 12/24/22 12/29/22 Yes Arabella Merles, PA-C  ALPRAZolam (XANAX) 0.5 MG tablet TAKE 1/2 TO 1 TABLET ONCE A DAY IF NEEDED FOR SEVERE ANXIETY OR SLEEP. **Use sparingly to prevent addiction and tolerance. 12/31/20   Judd Gaudier, NP  aspirin EC 81 MG tablet Take 81 mg by mouth daily.     [provider]  atenolol (TENORMIN) 50 MG tablet Take 50  mg by mouth daily.    [provider]  buPROPion (WELLBUTRIN XL) 150 MG 24 hr tablet Take 3 tablets (450 mg total) by mouth daily for depression. 06/02/22   Lucky Cowboy, MD  busPIRone (BUSPAR) 5 MG tablet Take     1/2 to 1 tablet     3 x /day      for Anxiety 12/18/22   Adela Glimpse, NP  cetirizine (ZYRTEC) 10 MG tablet Take 10 mg by mouth daily.    [provider]  Cholecalciferol (VITAMIN D-3) 5000 UNITS TABS Take 5,000 Units by mouth daily.     [provider]  Cinnamon 500 MG capsule Take 500 mg by mouth daily.    [provider]  ciprofloxacin (CIPRO) 250 MG tablet Take 1 tablet 2 x /day with Food for Infection 12/16/22   Adela Glimpse, NP  diphenhydrAMINE (BENADRYL) 25 MG tablet Take 25 mg by mouth every 6 (six) hours as needed.    [provider]  estradiol (ESTRACE) 0.1 MG/GM vaginal cream PLACE 1/2 GRAM VAGINALLY EVERY NIGHT AT BEDTIME FOR 2 WEEKS THEN USE 2 TIMES PER WEEK THEREAFTER 12/15/22   Marguerita Beards, MD  famotidine (PEPCID) 20 MG tablet TAKE 1 TABLET BY MOUTH TWICE DAILY FOR INDIGESTION AND HEARTBURN Patient taking differently: Take 40 mg by mouth 2 (two) times daily. 08/27/20 06/16/22  Judd Gaudier, NP  guaifenesin (HUMIBID E) 400 MG TABS tablet Take 400 mg by mouth daily.    [provider]  hydrocortisone (ANUSOL-HC) 25 MG suppository Insert 1 rectal suppository 2 x /day 12/16/22   Adela Glimpse, NP  hydrOXYzine (ATARAX) 25 MG tablet Take 1 tablet (25 mg total) by mouth at bedtime. 09/29/22   Selmer Dominion, NP  hyoscyamine (LEVSIN SL) 0.125 MG SL tablet DISSOLVE 1 TABLET UNDER THE TONGUE EVERY 4 HOURS IF NEEDED FOR NAUSEA, BLOATING, CRAMPING, OR DIARRHEA 12/11/22   Raynelle Dick, NP  ipratropium-albuterol (DUONEB) 0.5-2.5 (3) MG/3ML SOLN USE 1 VIAL IN NEBULIZER EVERY 4 HOURS AS NEEDED **MAX 6 DOSES A DAY** 11/24/19   Judd Gaudier, NP  levothyroxine (SYNTHROID) 100 MCG tablet Take  1 tablet  Daily  on an  empty stomach with only water for 30 minutes & no Antacid meds, Calcium or Magnesium for 4 hours & avoid Biotin 08/11/22   Lucky Cowboy, MD  lidocaine-prilocaine (EMLA) cream Apply 1 Application topically as needed. 10/04/22   Selmer Dominion, NP  lidocaine-prilocaine (EMLA) cream APPLY TO AFFECTED AREA(S) TO SKIN AS NEEDED 12/11/22   Marguerita Beards, MD  ondansetron (ZOFRAN) 8 MG tablet TAKE 1 TABLET BY MOUTH 3 TIMES A DAY AS NEEDED FOR NAUSEA Patient taking differently: Take 8 mg by mouth every 8 (eight) hours as needed. TAKE 1 TABLET BY MOUTH 3 TIMES A DAY AS NEEDED FOR NAUSEA 10/02/20   Judd Gaudier, NP  ondansetron (ZOFRAN-ODT) 4 MG disintegrating tablet Take 1 tablet (4 mg total) by mouth every 8 (eight) hours as needed for nausea or vomiting. 12/17/22   Adela Glimpse, NP  pantoprazole (PROTONIX) 20 MG tablet TAKE 1 TABLET BY MOUTH DAILY 08/17/22   Raynelle Dick, NP  predniSONE (DELTASONE) 10 MG tablet 1 tab 3 x day for 2 days, then 1 tab 2 x day for 2 days, then 1 tab 1 x day for 3 days 12/16/22   Adela Glimpse, NP  rosuvastatin (CRESTOR) 5 MG tablet Take  1 tablet 3 x /week for Cholesterol                                                         /                    TAKE                                     BY                                  MOUTH 04/06/22   Lucky Cowboy, MD  sertraline (ZOLOFT) 100 MG tablet Take 1 tablet (100 mg total) by mouth daily for mood. 11/12/22   Raynelle Dick, NP      Allergies    Ace inhibitors, Cephalexin, Dilaudid [hydromorphone hcl], Robaxin [methocarbamol], Macrobid [nitrofurantoin], Levaquin [levofloxacin], Neurontin [gabapentin], Ciprofloxacin, Codeine, Latex, and Sulfa antibiotics    Review of Systems   Review of Systems  Genitourinary:  Positive for pelvic pain.    Physical Exam Updated Vital Signs BP (!) 129/91   Pulse 99   Temp 98 F (36.7 C) (Oral)   Resp (!) 22   SpO2 100%  Physical Exam Vitals and  nursing note  reviewed. Exam conducted with a chaperone present.  Constitutional:      General: She is not in acute distress.    Appearance: She is well-developed.     Comments: Moving around in the bed, unable to find a comfortable position  HENT:     Head: Normocephalic and atraumatic.  Eyes:     Conjunctiva/sclera: Conjunctivae normal.  Cardiovascular:     Rate and Rhythm: Normal rate and regular rhythm.     Heart sounds: No murmur heard. Pulmonary:     Effort: Pulmonary effort is normal. No respiratory distress.     Breath sounds: Normal breath sounds.  Abdominal:     Palpations: Abdomen is soft.     Tenderness: There is no abdominal tenderness.  Genitourinary:    Comments: Chaperone present for exam Vaginal mesh appreciated erythematous excoriations superior to the cervical os Mild white discharge, no bleeding  Musculoskeletal:        General: No swelling.     Cervical back: Neck supple.  Skin:    General: Skin is warm and dry.     Capillary Refill: Capillary refill takes less than 2 seconds.  Neurological:     Mental Status: She is alert.  Psychiatric:        Mood and Affect: Mood normal.     ED Results / Procedures / Treatments   Labs (all labs ordered are listed, but only abnormal results are displayed) Labs Reviewed  COMPREHENSIVE METABOLIC PANEL - Abnormal; Notable for the following components:      Result Value   BUN 26 (*)    Creatinine, Ser 1.54 (*)    GFR, Estimated 36 (*)    All other components within normal limits  URINALYSIS, ROUTINE W REFLEX MICROSCOPIC - Abnormal; Notable for the following components:   APPearance HAZY (*)    Hgb urine dipstick MODERATE (*)    Protein, ur 30 (*)    Leukocytes,Ua LARGE (*)    All other components within normal limits  CBC    EKG None  Radiology CT ABDOMEN PELVIS W CONTRAST  Result Date: 12/24/2022 CLINICAL DATA:  Pelvic pain with history of mesh repair. History of interstitial cystitis. Severe pain today. EXAM: CT  ABDOMEN AND PELVIS WITH CONTRAST TECHNIQUE: Multidetector CT imaging of the abdomen and pelvis was performed using the standard protocol following bolus administration of intravenous contrast. RADIATION DOSE REDUCTION: This exam was performed according to the departmental dose-optimization program which includes automated exposure control, adjustment of the mA and/or kV according to patient size and/or use of iterative reconstruction technique. CONTRAST:  85mL OMNIPAQUE IOHEXOL 300 MG/ML  SOLN COMPARISON:  09/03/2020 FINDINGS: Lower chest: Subpleural fibrosis in the lung bases. Moderately large esophageal hiatal hernia. Hepatobiliary: No focal liver abnormality is seen. No gallstones, gallbladder wall thickening, or biliary dilatation. Pancreas: Unremarkable. No pancreatic ductal dilatation or surrounding inflammatory changes. Spleen: Normal in size without focal abnormality. Adrenals/Urinary Tract: Adrenal glands are unremarkable. Kidneys are normal, without renal calculi, focal lesion, or hydronephrosis. Bladder is unremarkable. Stomach/Bowel: Stomach is within normal limits. Appendix is not identified. No evidence of bowel wall thickening, distention, or inflammatory changes. Vascular/Lymphatic: Aortic atherosclerosis. No enlarged abdominal or pelvic lymph nodes. Reproductive: Status post hysterectomy. No adnexal masses. Other: No abdominal wall hernia or abnormality. No abdominopelvic ascites. Previous left abdominal wall hernia has been reduced or repair in the interval. Musculoskeletal: Normal alignment of the lumbar spine. Degenerative changes throughout. Old compression deformity of T12. IMPRESSION: 1. No acute process  demonstrated in the abdomen or pelvis. No evidence of bowel obstruction or inflammation. 2. Moderately large esophageal hiatal hernia. 3. Aortic atherosclerosis. Electronically Signed   By: Burman Nieves M.D.   On: 12/24/2022 16:43    Procedures Procedures    Medications Ordered in  ED Medications  lactated ringers bolus 1,000 mL (0 mLs Intravenous Stopped 12/24/22 1744)  acetaminophen (TYLENOL) tablet 1,000 mg (1,000 mg Oral Given 12/24/22 1516)  iohexol (OMNIPAQUE) 300 MG/ML solution 100 mL (85 mLs Intravenous Contrast Given 12/24/22 1613)  traMADol (ULTRAM) tablet 50 mg (50 mg Oral Given 12/24/22 1835)    ED Course/ Medical Decision Making/ A&P                                 Medical Decision Making Amount and/or Complexity of Data Reviewed Labs: ordered. Radiology: ordered.   72 y.o. female with pertinent past medical history of interstitial cystitis, CKD, H. pylori, IBS, hemorrhoids, hysterectomy, anterior and posterior repair with sacrospinous fixation (N/A, 11/10/2021); Bladder suspension (N/A, 11/10/2021); and Cystoscopy with fulgeration (11/10/2021) presents to the ED for concern of vaginal pain over the past couple months, worsened today  Differential diagnosis includes but is not limited to rectocele, cystocele, enterocele, vaginal mesh complication, UTI, urinary retention  ED Course:  Patient presents the ER with concern for severe vaginal pain.  She does have a history of pelvic floor dysfunction and has been following with urogyn.  She does have history of a mesh placement and intravesical Botox injections of the bladder.  Reports that since her last injection she has been having some pain in her vaginal area but has been acutely worse today.  She feels like there is a lump the protrusion in her vagina.  Denies any vaginal bleeding.  She has been having regular bowel movements and has been able to urinate, although notes she has not urinated since this morning at 1 AM.  Upon bladder scan, 65 mL noted.  No concern for urinary retention at this time.  Pelvic exam reveals some mesh near the cervical os, some areas of erythema.  Mild white discharge.  There is erythema concerning for possible irritation from her mesh.  No obvious prolapse. Given her history of mesh  placement and new onset pain, CT abdomen pelvis was obtained which showed no acute intra-abdominal pathology.  Patient was found to have leukocytes on urinalysis, suspect UTI.  Will treat her with course of Macrobid.  She states last time she took this she felt feverish, but did not have any other symptoms.  Does not know if this medication caused this feeling or she may have been sick with something else.  I discussed use of external vaginal lidocaine with the patient.  She states she already has this medication at home and it does not provide much benefit.  I will provide 5-day course of tramadol as needed for pain control.  Patient states she is taking this medication previously and has not had any adverse reactions.  She also reports taking morphine for pain control previously without any adverse reactions.  She states she took the dilaudid with Robaxin, and is unsure which medication caused her reaction last time.  Given she has taken morphine and tramadol previously without any complications, will discharge with a course of tramadol for pain control. She understands to take Macrobid for UTI.  Follow-up with soon as possible with her gynecologist for further evaluation of her symptoms.  Patient given dose of Toradol prior to discharge and tolerated this well with no reaction.  She has her daughter at bedside who was able to drive her home.   Impression: Vaginal pain Urinary tract infection  Disposition:  The patient was discharged home with instructions to take Macrobid for UTI, follow-up with gynecologist as soon as possible. Return precautions given.  Lab Tests: I Ordered, and personally interpreted labs.  The pertinent results include:   Urinalysis with leukocytes, hemoglobin CMP with elevated creatinine at 1.54, consistent with baseline CBC with no leukocytosis  Imaging Studies ordered: I ordered imaging studies including CT abdomen pelvis I independently visualized the imaging with  scope of interpretation limited to determining acute life threatening conditions related to emergency care. Imaging showed no acute abdominal pathology I agree with the radiologist interpretation  External records from outside source obtained and reviewed including gynecology notes   Co morbidities that complicate the patient evaluation  interstitial cystitis, CKD, H. pylori, IBS, hemorrhoids, hysterectomy, anterior and posterior repair with sacrospinous fixation (N/A, 11/10/2021); Bladder suspension (N/A, 11/10/2021); and Cystoscopy with fulgeration (11/10/2021)              Final Clinical Impression(s) / ED Diagnoses Final diagnoses:  Vaginal pain  Acute cystitis with hematuria    Rx / DC Orders ED Discharge Orders          Ordered    nitrofurantoin, macrocrystal-monohydrate, (MACROBID) 100 MG capsule  2 times daily        12/24/22 1818    traMADol (ULTRAM) 50 MG tablet  Every 6 hours PRN        12/24/22 1818              Arabella Merles, PA-C 12/24/22 1843    Rondel Baton, MD 12/25/22 1919

## 2022-12-24 NOTE — Discharge Instructions (Addendum)
Follow-up with your gynecologist as soon as possible for further evaluation of your mesh and vaginal pain  On your urine it seems like you have a UTI.  You have been prescribed Macrobid. Take this antibiotic 2  times a day for the next 5 days. Take the full course of your antibiotic even if you start feeling better. Antibiotics may cause you to have diarrhea.  You may take up to 1000mg  of tylenol every 6 hours as needed for pain.  Do not take more then 4g per day.  You have also been prescribed tramadol.  You may take 50 mg every 6 hours as needed for severe breakthrough pain.  Your CT results are as below: "IMPRESSION: 1. No acute process demonstrated in the abdomen or pelvis. No evidence of bowel obstruction or inflammation. 2. Moderately large esophageal hiatal hernia. 3. Aortic atherosclerosis."   In the ER if you develop worsening pain uncontrolled by your pain medications, fever or chills, any other new concerning symptoms.

## 2022-12-30 ENCOUNTER — Encounter: Payer: Self-pay | Admitting: Nurse Practitioner

## 2023-01-04 ENCOUNTER — Encounter: Payer: Self-pay | Admitting: Nurse Practitioner

## 2023-01-04 DIAGNOSIS — F3342 Major depressive disorder, recurrent, in full remission: Secondary | ICD-10-CM

## 2023-01-04 MED ORDER — AMITRIPTYLINE HCL 50 MG PO TABS: 50.0000 mg | ORAL_TABLET | Freq: Every day | ORAL | 0 refills | Status: DC

## 2023-01-06 ENCOUNTER — Other Ambulatory Visit (HOSPITAL_COMMUNITY)
Admission: RE | Admit: 2023-01-06 | Discharge: 2023-01-06 | Disposition: A | Discharge status: Home / Self Care | Payer: PPO | Source: Other Acute Inpatient Hospital | Attending: Obstetrics and Gynecology | Admitting: Obstetrics and Gynecology

## 2023-01-06 ENCOUNTER — Encounter: Payer: Self-pay | Admitting: Obstetrics and Gynecology

## 2023-01-06 ENCOUNTER — Ambulatory Visit (INDEPENDENT_AMBULATORY_CARE_PROVIDER_SITE_OTHER): Payer: PPO | Admitting: Obstetrics and Gynecology

## 2023-01-06 VITALS — BP 100/69 | HR 75

## 2023-01-06 DIAGNOSIS — R3989 Other symptoms and signs involving the genitourinary system: Secondary | ICD-10-CM | POA: Diagnosis not present

## 2023-01-06 DIAGNOSIS — R350 Frequency of micturition: Secondary | ICD-10-CM | POA: Diagnosis not present

## 2023-01-06 LAB — POCT URINALYSIS DIPSTICK
Bilirubin, UA: NEGATIVE
Glucose, UA: NEGATIVE
Nitrite, UA: NEGATIVE
Protein, UA: POSITIVE — AB
Spec Grav, UA: 1.01 (ref 1.010–1.025)
Urobilinogen, UA: 0.2 U/dL
pH, UA: 6 (ref 5.0–8.0)

## 2023-01-06 MED ORDER — HYDROXYZINE HCL 25 MG PO TABS: 25.0000 mg | ORAL_TABLET | Freq: Three times a day (TID) | ORAL | 5 refills | Status: DC | PRN

## 2023-01-06 NOTE — Progress Notes (Signed)
[facility] Return Visit  SUBJECTIVE  History of Present Illness: [name] is a 72 y.o. female seen in follow-up for bladder pain and incontinence.   Surgery: s/p Posterior repair, sacrospinous ligament fixation, midurethral sling, cystoscopy on 11/10/21 - Underwent intravesical botox in the office on 08/13/22  She has had several bladder instillations since last visit (combination of DMSO, lidocaine and heparin). She felt the first instillation recently without DMSO was helpful but not the ones after that.   Sometimes she has to strain to pee and feels this has worsened her hemorrhoids. This has worsened since the botox. She did not have urge incontinence after the botox but it has come back slightly again. She does feel she has pelvic muscle tension. Now she has burning since urination. The pain got so bad that she went to the ER on 8/29. She was treated with nitrofurantoin but a culture was not sent. She has been taking Azo and it helped some.   She was started on amitriptyline 2 days ago (prescribed by PCP) and has stopped the zoloft. She is taking hydoxyzine but it is only prescribed nightly and wants to take it more often. Also taking hyoscyamine. Also using the lidocaine cream. Has not been consistent with the estrogen cream.   Her son has stage IV colon cancer and she is very stressed over this.   Past Medical History: Patient  has a past medical history of Chronic diastolic CHF (congestive heart failure) (HCC), Chronic kidney disease (CKD), stage III (moderate) (HCC), Depression, Diverticulosis of colon, Emphysema of lung (HCC), GERD (gastroesophageal reflux disease), History of acute pyelonephritis, History of Helicobacter pylori infection (2012), History of recurrent UTIs, History of TIA (transient ischemic attack), Hyperlipidemia, mixed, Hypertension, Hypothyroidism, IBS (irritable bowel syndrome), IC (interstitial cystitis), Inguinal hernia, bilateral, Irritable  bowel syndrome with constipation and diarrhea, LBBB (left bundle branch block) (2015), OCD (obsessive compulsive disorder), OSA (obstructive sleep apnea), Osteopenia, Prolapsed hemorrhoids, Seasonal allergies, and Vitamin D deficiency.   Past Surgical History: She  has a past surgical history that includes Tonsillectomy; Bunionectomy (Bilateral, 1997); Hemorrhoid surgery (1976); Breast enhancement surgery (Bilateral); ORIF ankle fracture (Left, 08/30/2017); Breast implant removal (Bilateral, 03/07/2019); Esophagogastroduodenoscopy (egd) with propofol (N/A, 03/12/2019); Colonoscopy (10/2020); Cataract extraction w/ intraocular lens implant (Right, 2005); Inguinal hernia repair (N/A, 04/10/2021); Anterior and posterior repair with sacrospinous fixation (N/A, 11/10/2021); Bladder suspension (N/A, 11/10/2021); and Cystoscopy with fulgeration (11/10/2021).   Medications: She has a current medication list which includes the following prescription(s): amitriptyline, aspirin ec, atenolol, bupropion, cetirizine, vitamin d-3, cinnamon, estradiol, guaifenesin, hyoscyamine, ipratropium-albuterol, levothyroxine, lidocaine-prilocaine, ondansetron, pantoprazole, rosuvastatin, famotidine, and hydroxyzine, and the following Facility-Administered Medications: bupivacaine (pf), bupivacaine (pf), and bupivacaine (pf).   Allergies: Patient is allergic to ace inhibitors, cephalexin, dilaudid [hydromorphone hcl], robaxin [methocarbamol], macrobid [nitrofurantoin], levaquin [levofloxacin], neurontin [gabapentin], ciprofloxacin, codeine, latex, and sulfa antibiotics.   Social History: Patient  reports that she quit smoking about 20 years ago. Her smoking use included cigarettes. She started smoking about 60 years ago. She has a 40 pack-year smoking history. She has never used smokeless tobacco. She reports current alcohol use. She reports that she does not use drugs.      OBJECTIVE     Physical Exam: Vitals:   01/06/23 0830   BP: 100/69  Pulse: 75    Gen: No apparent distress, A&O x 3.  Detailed Urogynecologic Evaluation:  Deferred.    ASSESSMENT AND PLAN    [name] is a 72 y.o. with:  1. Urinary  frequency   2. Bladder pain     - Continue PO meds- amitriptyline, hyosciamine, atarax. Rx provided for atarax 3 times a day.  - can do Azo prn - lidocaine cream as needed. Restart estrace cream - Plan to restart pelvic PT, referral placed.  - Will have her follow up in a few months and reassess leakage symptoms. Discussed option of sacral nerve stimulation if this is a worsening issue once botox wears off.  - urine sent for culture today  Follow up 3 months  Marguerita Beards, MD

## 2023-01-07 LAB — URINE CULTURE: Culture: NO GROWTH

## 2023-01-16 ENCOUNTER — Other Ambulatory Visit (HOSPITAL_COMMUNITY): Payer: Self-pay

## 2023-01-25 ENCOUNTER — Other Ambulatory Visit: Payer: Self-pay | Admitting: Nurse Practitioner

## 2023-01-25 DIAGNOSIS — K6289 Other specified diseases of anus and rectum: Secondary | ICD-10-CM | POA: Diagnosis not present

## 2023-01-25 DIAGNOSIS — N3010 Interstitial cystitis (chronic) without hematuria: Secondary | ICD-10-CM | POA: Diagnosis not present

## 2023-01-25 DIAGNOSIS — R39198 Other difficulties with micturition: Secondary | ICD-10-CM | POA: Diagnosis not present

## 2023-01-25 DIAGNOSIS — F3342 Major depressive disorder, recurrent, in full remission: Secondary | ICD-10-CM

## 2023-01-25 DIAGNOSIS — K582 Mixed irritable bowel syndrome: Secondary | ICD-10-CM | POA: Diagnosis not present

## 2023-01-25 DIAGNOSIS — K642 Third degree hemorrhoids: Secondary | ICD-10-CM | POA: Insufficient documentation

## 2023-01-25 HISTORY — DX: Other difficulties with micturition: R39.198

## 2023-01-26 ENCOUNTER — Other Ambulatory Visit: Payer: Self-pay

## 2023-01-26 ENCOUNTER — Other Ambulatory Visit: Payer: Self-pay | Admitting: Nurse Practitioner

## 2023-01-26 DIAGNOSIS — R3989 Other symptoms and signs involving the genitourinary system: Secondary | ICD-10-CM

## 2023-01-26 NOTE — Telephone Encounter (Signed)
[name] is a 72 y.o. female called in requesting a 90 day supply of hydroxyzine

## 2023-01-27 MED ORDER — HYDROXYZINE HCL 25 MG PO TABS
25.0000 mg | ORAL_TABLET | Freq: Three times a day (TID) | ORAL | 11 refills | Status: DC | PRN
Start: 2023-01-27 — End: 2023-05-05

## 2023-02-02 DIAGNOSIS — N390 Urinary tract infection, site not specified: Secondary | ICD-10-CM | POA: Diagnosis not present

## 2023-02-02 DIAGNOSIS — J441 Chronic obstructive pulmonary disease with (acute) exacerbation: Secondary | ICD-10-CM | POA: Diagnosis not present

## 2023-02-02 DIAGNOSIS — N183 Chronic kidney disease, stage 3 unspecified: Secondary | ICD-10-CM | POA: Diagnosis not present

## 2023-02-02 DIAGNOSIS — R809 Proteinuria, unspecified: Secondary | ICD-10-CM | POA: Diagnosis not present

## 2023-02-02 DIAGNOSIS — E875 Hyperkalemia: Secondary | ICD-10-CM | POA: Diagnosis not present

## 2023-02-02 DIAGNOSIS — I129 Hypertensive chronic kidney disease with stage 1 through stage 4 chronic kidney disease, or unspecified chronic kidney disease: Secondary | ICD-10-CM | POA: Diagnosis not present

## 2023-02-10 ENCOUNTER — Other Ambulatory Visit: Payer: Self-pay | Admitting: Internal Medicine

## 2023-02-10 ENCOUNTER — Other Ambulatory Visit (HOSPITAL_COMMUNITY): Payer: Self-pay

## 2023-02-15 DIAGNOSIS — I700 Atherosclerosis of aorta: Secondary | ICD-10-CM | POA: Diagnosis not present

## 2023-02-15 DIAGNOSIS — K623 Rectal prolapse: Secondary | ICD-10-CM | POA: Diagnosis not present

## 2023-02-17 ENCOUNTER — Encounter: Payer: Self-pay | Admitting: Internal Medicine

## 2023-02-18 NOTE — Therapy (Signed)
OUTPATIENT PHYSICAL THERAPY FEMALE PELVIC EVALUATION   Patient Name: [name] MRN: [id number] DOB:[birth year], 72 y.o., female Today's Date: 02/19/2023  END OF SESSION:  PT End of Session - 02/19/23 1020     Visit Number 1    Date for PT Re-Evaluation 04/16/23    Authorization Type [employer]    Authorization - Visit Number 1    Authorization - Number of Visits 20    PT Start Time 1015    PT Stop Time 1055    PT Time Calculation (min) 40 min    Activity Tolerance Patient tolerated treatment well    Behavior During Therapy WFL for tasks assessed/performed             Past Medical History:  Diagnosis Date   Chronic diastolic CHF (congestive heart failure) (HCC)    cardriologist--- dr Lacretia Nicks. Flora Lipps;   dx in setting sepsis 11/ 2020;   echo in epic 03-10-2019 ef 55-60%, G1DD, mild to moderate TR no stenosis and normal RASP;  CTA 01-31-2021 epic   Chronic kidney disease (CKD), stage III (moderate) (HCC)    nephrologist--- dr Isa Rankin   Depression    Diverticulosis of colon    Emphysema of lung (HCC)    no home oxygen   GERD (gastroesophageal reflux disease)    History of acute pyelonephritis    w/ hx sepsis   History of Helicobacter pylori infection 2012   History of recurrent UTIs    History of TIA (transient ischemic attack)    TIAs   Hyperlipidemia, mixed    Hypertension    followed by pcp and nephrologist----  (nuclear stress test in epic 06-28-2015 low risk no ischemia, nuclear ef 60%) no meds   Hypothyroidism    IBS (irritable bowel syndrome)    IC (interstitial cystitis)    remote hx   Inguinal hernia, bilateral    Irritable bowel syndrome with constipation and diarrhea    LBBB (left bundle branch block) 2015   cardiologist --- dr Flora Lipps;  known chronic LBBB for several yrs   OCD (obsessive compulsive disorder)    OSA (obstructive sleep apnea)    no CPAP intolerant   Osteopenia    Prolapsed hemorrhoids    Seasonal allergies    Vitamin D deficiency     Past Surgical History:  Procedure Laterality Date   ANTERIOR AND POSTERIOR REPAIR WITH SACROSPINOUS FIXATION N/A 11/10/2021   Procedure: POSTERIOR REPAIR WITH SACROSPINOUS FIXATION;  Surgeon: Marguerita Beards, MD;  Location: [facility];  Service: Gynecology;  Laterality: N/A;  total time requested is 1.5 hrs   BLADDER SUSPENSION N/A 11/10/2021   Procedure: TRANSVAGINAL TAPE (TVT) PROCEDURE;  Surgeon: Marguerita Beards, MD;  Location: [facility], [place];  Service: Gynecology;  Laterality: N/A;   BREAST ENHANCEMENT SURGERY Bilateral    1970s   BREAST IMPLANT REMOVAL Bilateral 03/07/2019   BUNIONECTOMY Bilateral 1997   CATARACT EXTRACTION W/ INTRAOCULAR LENS IMPLANT Right 2005   COLONOSCOPY  10/2020   CYSTOSCOPY WITH FULGERATION  11/10/2021   Procedure: CYSTOSCOPY WITH FULGERATION;  Surgeon: Marguerita Beards, MD;  Location: [facility];  Service: Gynecology;;   ESOPHAGOGASTRODUODENOSCOPY (EGD) WITH PROPOFOL N/A 03/12/2019   Procedure: ESOPHAGOGASTRODUODENOSCOPY (EGD) WITH PROPOFOL;  Surgeon: Jeani Hawking, MD;  Location: [facility];  Service: Endoscopy;  Laterality: N/A;   HEMORRHOID SURGERY  1976   INGUINAL HERNIA REPAIR N/A 04/10/2021   Procedure: LAPAROSCOPIC LEFT INGUINAL HERNIA REPAIR AND RIGHT INGUINAL HERNII REPAIR WITH TAP BLOCK;  Surgeon: Karie Soda, MD;  Location: [facility];  Service: General;  Laterality: N/A;   ORIF ANKLE FRACTURE Left 08/30/2017   Procedure: OPEN REDUCTION INTERNAL FIXATION (ORIF) ANKLE FRACTURE;  Surgeon: Samson Frederic, MD;  Location: [facility] OR;  Service: Orthopedics;  Laterality: Left;   TONSILLECTOMY     CHILD   Patient Active Problem List   Diagnosis Date Noted   Coronary atherosclerosis of native coronary artery (per CTA 01/31/2021)  06/14/2021   Interstitial cystitis 06/14/2020   Overweight (BMI 25.0-29.9) 06/13/2020   Former smoker (40 pack year, quit 2004) 08/30/2019    Atherosclerosis of aorta (HCC) by Chest CT scan in 2020  03/22/2019   Emphysema lung (HCC) - per CT 03/09/2019    Chronic diastolic CHF (congestive heart failure) (HCC) 03/11/2019   Osteopenia 01/16/2019   History of colon polyps 06/28/2018   CKD Stage 3 (HCC) 01/10/2015   IBS  01/10/2015   Hyperlipidemia, mixed 06/12/2014   Medication management 06/12/2014   LBBB (left bundle branch block) 07/24/2013   HTN (hypertension) 07/24/2013   Major depression, recurrent, full remission (HCC)    OCD (obsessive compulsive disorder)    Abnormal glucose    Migraines    Vitamin D deficiency    Hypothyroidism 04/05/2013    PCP: Langley Adie, MD  REFERRING PROVIDER: [phone] (ICD-10-CM) - Bladder pain   REFERRING DIAG: Marguerita Beards, MD   THERAPY DIAG:  Muscle weakness (generalized)  Unspecified lack of coordination  Rationale for Evaluation and Treatment: Rehabilitation  ONSET DATE: 08/13/22  SUBJECTIVE:                                                                                                                                                                                           SUBJECTIVE STATEMENT:  Surgery: s/p Posterior repair, sacrospinous ligament fixation, midurethral sling, cystoscopy on 11/10/21 ; Underwent intravesical botox in the office on 08/13/22 . Patient had Botox to bladder. Patient found out about rectal prolapse by Dr. Maisie Fus this month.  Fluid intake: Yes: water, coffee in morning    PAIN:  Are you having pain? Yes NPRS scale: 5-10/10 Pain location: Anal and Vaginal  Pain type: aching, burning, and sharp Pain description: intermittent   Aggravating factors: food she eats, straining Relieving factors: relax, medicine  PRECAUTIONS: None  RED FLAGS: None   WEIGHT BEARING RESTRICTIONS: No  FALLS:  Has patient fallen in last 6 months? No  LIVING ENVIRONMENT: Lives with: lives alone  OCCUPATION: retired  PLOF: Independent  PATIENT  GOALS: manage rectal prolapse and pain, not strain to urinate  PERTINENT HISTORY:  Stage III kidney disease; Hemorrhoid  surgery 1976; Inguinal hernia repair; IBS; IC; OCD; Osteopenia; aterior and posterior repair with sacrospinous fixation 2023; bladder suspension 2023 Sexual abuse: No  BOWEL MOVEMENT: Pain with bowel movement: Yes, 6/10 sometimes pain is in vaginal area at the same time Type of bowel movement:Type (Bristol Stool Scale) type 3, Frequency daily, Strain Yes, and Splinting none Fully empty rectum: No Leakage: No Fiber supplement: Yes: Beneful  URINATION: Pain with urination: Yes, 8/10 Fully empty bladder: Yes:   strains to urinate Stream: Weak Urgency: Yes: but when she urinates the amount is small Frequency: night time every hour so she does not sleep; every 2-3 hours Leakage: Urge to void, Walking to the bathroom, Coughing, Sneezing, Laughing, and Exercise Pads: Yes: day 5, night 1 pad  INTERCOURSE:not active  PREGNANCY: Vaginal deliveries 3    PROLAPSE: Rectal prolapse   OBJECTIVE:  Note: Objective measures were completed at Evaluation unless otherwise noted.  DIAGNOSTIC FINDINGS:  none    COGNITION: Overall cognitive status: Within functional limits for tasks assessed     SENSATION: Light touch: Appears intact Proprioception: Appears intact    POSTURE: rounded shoulders and forward head  PELVIC ALIGNMENT:  LUMBARAROM/PROM: lumbar ROM is full   LOWER EXTREMITY ROM: Bilateral hip ROM is full   LOWER EXTREMITY MMT: bilateral hip strength is 4/5  PALPATION:   General  firmness in abdomen, able to contract the abdomin correctly, press suprapubically makes her feel like she needs to urinate, decreased mobility of lower rib cage.                 External Perineal Exam gapping anus                             Internal Pelvic Floor tenderness in the rectal canal, tenderness in bilateral levator ani, obturator internist, along the bladder and  urethra  Patient confirms identification and approves PT to assess internal pelvic floor and treatment Yes  PELVIC MMT:   MMT eval  Vaginal 1/5  Internal Anal Sphincter 2/5 with small lift  External Anal Sphincter 2/5 with small lift  Puborectalis 2/5  (Blank rows = not tested)        TONE: Increased on the right  PROLAPSE: Rectal prolapse  TODAY'S TREATMENT:                                                                                                                              DATE: 02/19/23  EVAL See below   PATIENT EDUCATION:  02/19/23 Education details: Access Code: [email or web address], educated patient to lay on her back when she feels her rectal prolapse to reset and reduce her pain Person educated: Patient Education method: Explanation, Demonstration, Tactile cues, Verbal cues, and Handouts Education comprehension: verbalized understanding, returned demonstration, verbal cues required, tactile cues required, and needs further education  HOME EXERCISE PROGRAM: 02/19/23 Access Code: [email or web address] URL: [email or web address] Date: 02/19/2023 Prepared by: Elnita Maxwell  Eugena Rhue  Exercises - Supine Pelvic Floor Contraction  - 3 x daily - 7 x weekly - 1 sets - 5 reps - 10 sec hold  ASSESSMENT:  CLINICAL IMPRESSION: Patient is a 72 y.o. female who was seen today for physical therapy evaluation and treatment for bladder pain. Patient has had bladder pain since she had botox in 07/2022. She found out she has a rectal prolapse in October. Patient reports rectal and vaginal pain 5-10/10 with bowel movements and when urinating. She has tostrain to have a bowel movement and when she is urinating. She does not feel she is emptying her rectum fully. Her anus is gapping. Rectal strength is 2/5. Vaginal strength is 1/5. She has tenderness in the rectal canal, bilateral levator ani, bilateral obturator internist, along the bladder and urethra. She leaks urine with urge to void, walking to the  bathroom, coughing, sneezing, laughing, and exercise. She will urinate every hour at night and not get enough sleep. She will be able to wait several hours to urinate during the day.she wears 5 pads during the day and 1 at night. Patient will benefit from skilled therapy to work on pelvic floor strength and reduction of pain to improve urination and bowel movements.   OBJECTIVE IMPAIRMENTS: decreased coordination, decreased strength, increased fascial restrictions, increased muscle spasms, and pain.   ACTIVITY LIMITATIONS: sitting, standing, continence, toileting, and locomotion level  PARTICIPATION LIMITATIONS: meal prep, cleaning, laundry, shopping, and community activity  PERSONAL FACTORS: Age, Fitness, Time since onset of injury/illness/exacerbation, and 1-2 comorbidities: Stage III kidney disease; Hemorrhoid surgery 1976; Inguinal hernia repair; IBS; IC; OCD; Osteopenia; aterior and posterior repair with sacrospinous fixation 2023; bladder suspension 2023  are also affecting patient's functional outcome.   REHAB POTENTIAL: Good  CLINICAL DECISION MAKING: Evolving/moderate complexity  EVALUATION COMPLEXITY: Moderate   GOALS: Goals reviewed with patient? Yes  SHORT TERM GOALS: Target date: 03/18/23  Patient independent with initial HEP for pelvic floor and core strength.  Baseline: Goal status: INITIAL  2.  Patient is independent with flexibility exercises to relax the pelvic floor muscles.  Baseline:  Goal status: INITIAL  3.  Patient is educated on ways to manage her rectal prolapse.  Baseline:  Goal status: INITIAL  4.  Patient is educated on ways to manage her pelvic pain.  Baseline:  Goal status: INITIAL   LONG TERM GOALS: Target date: 04/16/23  Patient is independent with advanced HEP for core and pelvic floor to reduce leakage.  Baseline:  Goal status: INITIAL  2.  Patient is able to have a bowel movement or urinate with pain level </= 1-2/10 due to the ability  to relax her muscles and use breath.  Baseline:  Goal status: INITIAL  3.  Patient is able to urinate </= 2 times per night due to reduction of the urge to void.  Baseline:  Goal status: INITIAL  4.  Patient reports her urinary leakage decreased >/= 75% due to improved pelvic floor strength >/= 3/5 and reduction of trigger points.  Baseline:  Goal status: INITIAL  5.  Patient is able to urinate with straining </= 75% due to improve relaxation of pelvic floor.  Baseline:  Goal status: INITIAL   PLAN:  PT FREQUENCY: 1x/week  PT DURATION: 8 weeks  PLANNED INTERVENTIONS: 97110-Therapeutic exercises, 97530- Therapeutic activity, [id number]- Neuromuscular re-education, 97140- Manual therapy, 97014- Electrical stimulation (unattended), 97035- Ultrasound, Patient/Family education, Dry Needling, Cryotherapy, Moist heat, and Biofeedback  PLAN FOR NEXT SESSION: manual work to abdomen to work  on diaphragmatic breathing, sitting on ball to massage the pelvic floor, hip stretches   Eulis Foster, PT 02/19/23 11:23 AM

## 2023-02-19 ENCOUNTER — Other Ambulatory Visit: Payer: Self-pay

## 2023-02-19 ENCOUNTER — Encounter: Payer: Self-pay | Admitting: Physical Therapy

## 2023-02-19 ENCOUNTER — Ambulatory Visit: Payer: PPO | Attending: Obstetrics and Gynecology | Admitting: Physical Therapy

## 2023-02-19 DIAGNOSIS — R279 Unspecified lack of coordination: Secondary | ICD-10-CM

## 2023-02-19 DIAGNOSIS — R102 Pelvic and perineal pain: Secondary | ICD-10-CM | POA: Diagnosis not present

## 2023-02-19 DIAGNOSIS — M6281 Muscle weakness (generalized): Secondary | ICD-10-CM

## 2023-02-24 ENCOUNTER — Other Ambulatory Visit: Payer: Self-pay

## 2023-02-24 DIAGNOSIS — F3342 Major depressive disorder, recurrent, in full remission: Secondary | ICD-10-CM

## 2023-02-24 MED ORDER — AMITRIPTYLINE HCL 50 MG PO TABS: 50.0000 mg | ORAL_TABLET | Freq: Every day | ORAL | 0 refills | Status: DC

## 2023-02-26 ENCOUNTER — Encounter: Payer: Self-pay | Admitting: Physical Therapy

## 2023-02-26 ENCOUNTER — Ambulatory Visit: Payer: PPO | Attending: Obstetrics and Gynecology | Admitting: Physical Therapy

## 2023-02-26 DIAGNOSIS — R279 Unspecified lack of coordination: Secondary | ICD-10-CM | POA: Diagnosis not present

## 2023-02-26 DIAGNOSIS — M6281 Muscle weakness (generalized): Secondary | ICD-10-CM | POA: Diagnosis not present

## 2023-02-26 DIAGNOSIS — R102 Pelvic and perineal pain: Secondary | ICD-10-CM | POA: Diagnosis not present

## 2023-02-26 NOTE — Therapy (Signed)
OUTPATIENT PHYSICAL THERAPY FEMALE PELVIC TREATMENT   Patient Name: [name] MRN: [id number] DOB:1950-10-12, 72 y.o., female Today's Date: 02/26/2023  END OF SESSION:  PT End of Session - 02/26/23 1020     Visit Number 2    Date for PT Re-Evaluation 04/16/23    Authorization Type [employer]    Authorization - Visit Number 2    Authorization - Number of Visits 20    PT Start Time 1015    PT Stop Time 1055    PT Time Calculation (min) 40 min    Activity Tolerance Patient tolerated treatment well    Behavior During Therapy WFL for tasks assessed/performed             Past Medical History:  Diagnosis Date   Chronic diastolic CHF (congestive heart failure) (HCC)    cardriologist--- dr Lacretia Nicks. Flora Lipps;   dx in setting sepsis 11/ 2020;   echo in epic 03-10-2019 ef 55-60%, G1DD, mild to moderate TR no stenosis and normal RASP;  CTA 01-31-2021 epic   Chronic kidney disease (CKD), stage III (moderate) (HCC)    nephrologist--- dr Isa Rankin   Depression    Diverticulosis of colon    Emphysema of lung (HCC)    no home oxygen   GERD (gastroesophageal reflux disease)    History of acute pyelonephritis    w/ hx sepsis   History of Helicobacter pylori infection 2012   History of recurrent UTIs    History of TIA (transient ischemic attack)    TIAs   Hyperlipidemia, mixed    Hypertension    followed by pcp and nephrologist----  (nuclear stress test in epic 06-28-2015 low risk no ischemia, nuclear ef 60%) no meds   Hypothyroidism    IBS (irritable bowel syndrome)    IC (interstitial cystitis)    remote hx   Inguinal hernia, bilateral    Irritable bowel syndrome with constipation and diarrhea    LBBB (left bundle branch block) 2015   cardiologist --- dr Flora Lipps;  known chronic LBBB for several yrs   OCD (obsessive compulsive disorder)    OSA (obstructive sleep apnea)    no CPAP intolerant   Osteopenia    Prolapsed hemorrhoids    Seasonal allergies    Vitamin D deficiency     Past Surgical History:  Procedure Laterality Date   ANTERIOR AND POSTERIOR REPAIR WITH SACROSPINOUS FIXATION N/A 11/10/2021   Procedure: POSTERIOR REPAIR WITH SACROSPINOUS FIXATION;  Surgeon: Marguerita Beards, MD;  Location: [facility];  Service: Gynecology;  Laterality: N/A;  total time requested is 1.5 hrs   BLADDER SUSPENSION N/A 11/10/2021   Procedure: TRANSVAGINAL TAPE (TVT) PROCEDURE;  Surgeon: Marguerita Beards, MD;  Location: [facility];  Service: Gynecology;  Laterality: N/A;   BREAST ENHANCEMENT SURGERY Bilateral    1970s   BREAST IMPLANT REMOVAL Bilateral 03/07/2019   BUNIONECTOMY Bilateral 1997   CATARACT EXTRACTION W/ INTRAOCULAR LENS IMPLANT Right 2005   COLONOSCOPY  10/2020   CYSTOSCOPY WITH FULGERATION  11/10/2021   Procedure: CYSTOSCOPY WITH FULGERATION;  Surgeon: Marguerita Beards, MD;  Location: [facility];  Service: Gynecology;;   ESOPHAGOGASTRODUODENOSCOPY (EGD) WITH PROPOFOL N/A 03/12/2019   Procedure: ESOPHAGOGASTRODUODENOSCOPY (EGD) WITH PROPOFOL;  Surgeon: Jeani Hawking, MD;  Location: [facility] ENDOSCOPY;  Service: Endoscopy;  Laterality: N/A;   HEMORRHOID SURGERY  1976   INGUINAL HERNIA REPAIR N/A 04/10/2021   Procedure: LAPAROSCOPIC LEFT INGUINAL HERNIA REPAIR AND RIGHT INGUINAL HERNII REPAIR WITH TAP BLOCK;  Surgeon: Karie Soda, MD;  Location: [facility];  Service: General;  Laterality: N/A;   ORIF ANKLE FRACTURE Left 08/30/2017   Procedure: OPEN REDUCTION INTERNAL FIXATION (ORIF) ANKLE FRACTURE;  Surgeon: Samson Frederic, MD;  Location: [facility] OR;  Service: Orthopedics;  Laterality: Left;   TONSILLECTOMY     CHILD   Patient Active Problem List   Diagnosis Date Noted   Coronary atherosclerosis of native coronary artery (per CTA 01/31/2021)  06/14/2021   Interstitial cystitis 06/14/2020   Overweight (BMI 25.0-29.9) 06/13/2020   Former smoker (40 pack year, quit 2004) 08/30/2019    Atherosclerosis of aorta (HCC) by Chest CT scan in 2020  03/22/2019   Emphysema lung (HCC) - per CT 03/09/2019    Chronic diastolic CHF (congestive heart failure) (HCC) 03/11/2019   Osteopenia 01/16/2019   History of colon polyps 06/28/2018   CKD Stage 3 (HCC) 01/10/2015   IBS  01/10/2015   Hyperlipidemia, mixed 06/12/2014   Medication management 06/12/2014   LBBB (left bundle branch block) 07/24/2013   HTN (hypertension) 07/24/2013   Major depression, recurrent, full remission (HCC)    OCD (obsessive compulsive disorder)    Abnormal glucose    Migraines    Vitamin D deficiency    Hypothyroidism 04/05/2013    PCP: Langley Adie, MD  REFERRING PROVIDER: [phone] (ICD-10-CM) - Bladder pain   REFERRING DIAG: Marguerita Beards, MD   THERAPY DIAG:  Muscle weakness (generalized)  Unspecified lack of coordination  Pelvic pain  Rationale for Evaluation and Treatment: Rehabilitation  ONSET DATE: 08/13/22  SUBJECTIVE:                                                                                                                                                                                           SUBJECTIVE STATEMENT: I had tomato soup and that could irritate my bladder.   Fluid intake: Yes: water, coffee in morning    PAIN:  Are you having pain? Yes NPRS scale: 4/10  on 02/26/23 Pain location: Anal and Vaginal  Pain type: aching, burning, and sharp Pain description: intermittent   Aggravating factors: food she eats, straining Relieving factors: relax, medicine  PRECAUTIONS: None  RED FLAGS: None   WEIGHT BEARING RESTRICTIONS: No  FALLS:  Has patient fallen in last 6 months? No  LIVING ENVIRONMENT: Lives with: lives alone  OCCUPATION: retired  PLOF: Independent  PATIENT GOALS: manage rectal prolapse and pain, not strain to urinate  PERTINENT HISTORY:  Stage III kidney disease; Hemorrhoid surgery 1976; Inguinal hernia repair; IBS; IC; OCD;  Osteopenia; aterior and posterior repair with sacrospinous fixation 2023; bladder suspension 2023 Sexual abuse:  No  BOWEL MOVEMENT: Pain with bowel movement: Yes, 6/10 sometimes pain is in vaginal area at the same time Type of bowel movement:Type (Bristol Stool Scale) type 3, Frequency daily, Strain Yes, and Splinting none Fully empty rectum: No Leakage: No Fiber supplement: Yes: Beneful  URINATION: Pain with urination: Yes, 8/10 Fully empty bladder: Yes:   strains to urinate Stream: Weak Urgency: Yes: but when she urinates the amount is small Frequency: night time every hour so she does not sleep; every 2-3 hours Leakage: Urge to void, Walking to the bathroom, Coughing, Sneezing, Laughing, and Exercise Pads: Yes: day 5, night 1 pad  INTERCOURSE:not active  PREGNANCY: Vaginal deliveries 3    PROLAPSE: Rectal prolapse   OBJECTIVE:  Note: Objective measures were completed at Evaluation unless otherwise noted.  DIAGNOSTIC FINDINGS:  none    COGNITION: Overall cognitive status: Within functional limits for tasks assessed     SENSATION: Light touch: Appears intact Proprioception: Appears intact    POSTURE: rounded shoulders and forward head  PELVIC ALIGNMENT:  LUMBARAROM/PROM: lumbar ROM is full   LOWER EXTREMITY ROM: Bilateral hip ROM is full   LOWER EXTREMITY MMT: bilateral hip strength is 4/5  PALPATION:   General  firmness in abdomen, able to contract the abdomin correctly, press suprapubically makes her feel like she needs to urinate, decreased mobility of lower rib cage.                 External Perineal Exam gapping anus                             Internal Pelvic Floor tenderness in the rectal canal, tenderness in bilateral levator ani, obturator internist, along the bladder and urethra  Patient confirms identification and approves PT to assess internal pelvic floor and treatment Yes  PELVIC MMT:   MMT eval  Vaginal 1/5  Internal Anal  Sphincter 2/5 with small lift  External Anal Sphincter 2/5 with small lift  Puborectalis 2/5  (Blank rows = not tested)        TONE: Increased on the right  PROLAPSE: Rectal prolapse  TODAY'S TREATMENT:    02/26/23 Manual: Soft tissue mobilization: Circular massage to the abdomen to promote peristalic motion of the intestines Manual work to the diaphragm to improve mobility Myofascial release: Fascial release around the lower abdomen to lift the intestines off the bladder, release of the sides of the abdomen to reduce the restrictions, tissue rolling of the abdomen, release of the mesenteric root Exercises: Stretches/mobility: Sit on ball and massage the pelvic floor, sometimes the patient will use her heel to massage the pelvic floor Supine butterfly holding 30 sec  Supine piriformis stretch holding 30 sec bil.    PATIENT EDUCATION:  02/26/23 Education details: Access Code: [email or web address], educated patient to lay on her back when she feels her rectal prolapse to reset and reduce her pain Person educated: Patient Education method: Explanation, Demonstration, Tactile cues, Verbal cues, and Handouts Education comprehension: verbalized understanding, returned demonstration, verbal cues required, tactile cues required, and needs further education  HOME EXERCISE PROGRAM: 02/26/23 Access Code: [email or web address] URL: [email or web address] Date: 02/26/2023 Prepared by: Eulis Foster  Program Notes sit on ball and massage the pelvic floor.   Exercises - Supine Pelvic Floor Contraction  - 3 x daily - 7 x weekly - 1 sets - 5 reps - 10 sec hold - Supine Pelvic Floor Stretch  - 1 x daily - 7 x weekly -  1 sets - 1 reps - 30 sec hold - Supine Figure 4 Piriformis Stretch with Leg Extension  - 1 x daily - 7 x weekly - 1 sets - 1 reps - 30 sec hold  ASSESSMENT:  CLINICAL IMPRESSION: Patient is a 72 y.o. female who was seen today for physical therapy  treatment for bladder pain. Patient  abdomen was softer after the manual work to put less pressure on the pelvic floor. Patient has learned stretches to help elongate the pelvic floor.  Patient will benefit from skilled therapy to work on pelvic floor strength and reduction of pain to improve urination and bowel movements.   OBJECTIVE IMPAIRMENTS: decreased coordination, decreased strength, increased fascial restrictions, increased muscle spasms, and pain.   ACTIVITY LIMITATIONS: sitting, standing, continence, toileting, and locomotion level  PARTICIPATION LIMITATIONS: meal prep, cleaning, laundry, shopping, and community activity  PERSONAL FACTORS: Age, Fitness, Time since onset of injury/illness/exacerbation, and 1-2 comorbidities: Stage III kidney disease; Hemorrhoid surgery 1976; Inguinal hernia repair; IBS; IC; OCD; Osteopenia; aterior and posterior repair with sacrospinous fixation 2023; bladder suspension 2023  are also affecting patient's functional outcome.   REHAB POTENTIAL: Good  CLINICAL DECISION MAKING: Evolving/moderate complexity  EVALUATION COMPLEXITY: Moderate   GOALS: Goals reviewed with patient? Yes  SHORT TERM GOALS: Target date: 03/18/23  Patient independent with initial HEP for pelvic floor and core strength.  Baseline: Goal status: INITIAL  2.  Patient is independent with flexibility exercises to relax the pelvic floor muscles.  Baseline:  Goal status: INITIAL  3.  Patient is educated on ways to manage her rectal prolapse.  Baseline:  Goal status: INITIAL  4.  Patient is educated on ways to manage her pelvic pain.  Baseline:  Goal status: INITIAL   LONG TERM GOALS: Target date: 04/16/23  Patient is independent with advanced HEP for core and pelvic floor to reduce leakage.  Baseline:  Goal status: INITIAL  2.  Patient is able to have a bowel movement or urinate with pain level </= 1-2/10 due to the ability to relax her muscles and use breath.  Baseline:  Goal status: INITIAL  3.   Patient is able to urinate </= 2 times per night due to reduction of the urge to void.  Baseline:  Goal status: INITIAL  4.  Patient reports her urinary leakage decreased >/= 75% due to improved pelvic floor strength >/= 3/5 and reduction of trigger points.  Baseline:  Goal status: INITIAL  5.  Patient is able to urinate with straining </= 75% due to improve relaxation of pelvic floor.  Baseline:  Goal status: INITIAL   PLAN:  PT FREQUENCY: 1x/week  PT DURATION: 8 weeks  PLANNED INTERVENTIONS: 97110-Therapeutic exercises, 97530- Therapeutic activity, 97112- Neuromuscular re-education, 97140- Manual therapy, 97014- Electrical stimulation (unattended), 97035- Ultrasound, Patient/Family education, Dry Needling, Cryotherapy, Moist heat, and Biofeedback  PLAN FOR NEXT SESSION: manual work to abdomen on left to work on diaphragmatic breathing, urge to void, manual work to pelvic floor to work on [employer], PT 02/26/23 11:00 AM

## 2023-03-04 ENCOUNTER — Other Ambulatory Visit: Payer: Self-pay

## 2023-03-04 ENCOUNTER — Other Ambulatory Visit (HOSPITAL_COMMUNITY): Payer: Self-pay

## 2023-03-05 ENCOUNTER — Ambulatory Visit: Payer: PPO | Admitting: Physical Therapy

## 2023-03-05 ENCOUNTER — Encounter: Payer: Self-pay | Admitting: Physical Therapy

## 2023-03-05 DIAGNOSIS — M6281 Muscle weakness (generalized): Secondary | ICD-10-CM

## 2023-03-05 DIAGNOSIS — R279 Unspecified lack of coordination: Secondary | ICD-10-CM

## 2023-03-05 DIAGNOSIS — R102 Pelvic and perineal pain: Secondary | ICD-10-CM

## 2023-03-05 NOTE — Patient Instructions (Signed)
Urge Incontinence  Ideal urination frequency is every 2-4 wakeful hours, which equates to 5-8 times within a 24-hour period.   Urge incontinence is leakage that occurs when the bladder muscle contracts, creating a sudden need to go before getting to the bathroom.   Going too often when your bladder isn't actually full can disrupt the body's automatic signals to store and hold urine longer, which will increase urgency/frequency.  In this case, the bladder "is running the show" and strategies can be learned to retrain this pattern.   One should be able to control the first urge to urinate, at around 155m.  The bladder can hold up to a "grande latte," or 409m To help you gain control, practice the Urge Drill below when urgency strikes.  This drill will help retrain your bladder signals and allow you to store and hold urine longer.  The overall goal is to stretch out your time between voids to reach a more manageable voiding schedule.    Practice your "quick flicks" often throughout the day (each waking hour) even when you don't need feel the urge to go.  This will help strengthen your pelvic floor muscles, making them more effective in controlling leakage.  Urge Drill  When you feel an urge to go, follow these steps to regain control: Stop what you are doing and be still Take one deep breath, directing your air into your abdomen Think an affirming thought, such as "I've got this." Do 5 quick flicks of your pelvic floor Walk with control to the bathroom to void, or delay voiding  [facility]SuTotowarLone RockNC [id number] # [phone]

## 2023-03-05 NOTE — Therapy (Signed)
OUTPATIENT PHYSICAL THERAPY FEMALE PELVIC TREATMENT   Patient Name: [name] MRN: [id number] DOB:[birth year], 72 y.o., female Today's Date: 03/05/2023  END OF SESSION:  PT End of Session - 03/05/23 1017     Visit Number 3    Date for PT Re-Evaluation 04/16/23    Authorization Type [employer]    Authorization - Visit Number 3    Authorization - Number of Visits 20    PT Start Time 1015    PT Stop Time 1055    PT Time Calculation (min) 40 min    Activity Tolerance Patient tolerated treatment well    Behavior During Therapy WFL for tasks assessed/performed             Past Medical History:  Diagnosis Date   Chronic diastolic CHF (congestive heart failure) (HCC)    cardriologist--- dr Lacretia Nicks. Flora Lipps;   dx in setting sepsis 11/ 2020;   echo in epic 03-10-2019 ef 55-60%, G1DD, mild to moderate TR no stenosis and normal RASP;  CTA 01-31-2021 epic   Chronic kidney disease (CKD), stage III (moderate) (HCC)    nephrologist--- dr Isa Rankin   Depression    Diverticulosis of colon    Emphysema of lung (HCC)    no home oxygen   GERD (gastroesophageal reflux disease)    History of acute pyelonephritis    w/ hx sepsis   History of Helicobacter pylori infection 2012   History of recurrent UTIs    History of TIA (transient ischemic attack)    TIAs   Hyperlipidemia, mixed    Hypertension    followed by pcp and nephrologist----  (nuclear stress test in epic 06-28-2015 low risk no ischemia, nuclear ef 60%) no meds   Hypothyroidism    IBS (irritable bowel syndrome)    IC (interstitial cystitis)    remote hx   Inguinal hernia, bilateral    Irritable bowel syndrome with constipation and diarrhea    LBBB (left bundle branch block) 2015   cardiologist --- dr Flora Lipps;  known chronic LBBB for several yrs   OCD (obsessive compulsive disorder)    OSA (obstructive sleep apnea)    no CPAP intolerant   Osteopenia    Prolapsed hemorrhoids    Seasonal allergies    Vitamin D deficiency     Past Surgical History:  Procedure Laterality Date   ANTERIOR AND POSTERIOR REPAIR WITH SACROSPINOUS FIXATION N/A 11/10/2021   Procedure: POSTERIOR REPAIR WITH SACROSPINOUS FIXATION;  Surgeon: Marguerita Beards, MD;  Location: [facility];  Service: Gynecology;  Laterality: N/A;  total time requested is 1.5 hrs   BLADDER SUSPENSION N/A 11/10/2021   Procedure: TRANSVAGINAL TAPE (TVT) PROCEDURE;  Surgeon: Marguerita Beards, MD;  Location: [facility];  Service: Gynecology;  Laterality: N/A;   BREAST ENHANCEMENT SURGERY Bilateral    1970s   BREAST IMPLANT REMOVAL Bilateral 03/07/2019   BUNIONECTOMY Bilateral 1997   CATARACT EXTRACTION W/ INTRAOCULAR LENS IMPLANT Right 2005   COLONOSCOPY  10/2020   CYSTOSCOPY WITH FULGERATION  11/10/2021   Procedure: CYSTOSCOPY WITH FULGERATION;  Surgeon: Marguerita Beards, MD;  Location: [facility];  Service: Gynecology;;   ESOPHAGOGASTRODUODENOSCOPY (EGD) WITH PROPOFOL N/A 03/12/2019   Procedure: ESOPHAGOGASTRODUODENOSCOPY (EGD) WITH PROPOFOL;  Surgeon: Jeani Hawking, MD;  Location: [facility] ENDOSCOPY;  Service: Endoscopy;  Laterality: N/A;   HEMORRHOID SURGERY  1976   INGUINAL HERNIA REPAIR N/A 04/10/2021   Procedure: LAPAROSCOPIC LEFT INGUINAL HERNIA REPAIR AND RIGHT INGUINAL HERNII REPAIR WITH TAP BLOCK;  Surgeon: Karie Soda, MD;  Location: [facility];  Service: General;  Laterality: N/A;   ORIF ANKLE FRACTURE Left 08/30/2017   Procedure: OPEN REDUCTION INTERNAL FIXATION (ORIF) ANKLE FRACTURE;  Surgeon: Samson Frederic, MD;  Location: [facility];  Service: Orthopedics;  Laterality: Left;   TONSILLECTOMY     CHILD   Patient Active Problem List   Diagnosis Date Noted   Coronary atherosclerosis of native coronary artery (per CTA 01/31/2021)  06/14/2021   Interstitial cystitis 06/14/2020   Overweight (BMI 25.0-29.9) 06/13/2020   Former smoker (40 pack year, quit 2004) 08/30/2019    Atherosclerosis of aorta (HCC) by Chest CT scan in 2020  03/22/2019   Emphysema lung (HCC) - per CT 03/09/2019    Chronic diastolic CHF (congestive heart failure) (HCC) 03/11/2019   Osteopenia 01/16/2019   History of colon polyps 06/28/2018   CKD Stage 3 (HCC) 01/10/2015   IBS  01/10/2015   Hyperlipidemia, mixed 06/12/2014   Medication management 06/12/2014   LBBB (left bundle branch block) 07/24/2013   HTN (hypertension) 07/24/2013   Major depression, recurrent, full remission (HCC)    OCD (obsessive compulsive disorder)    Abnormal glucose    Migraines    Vitamin D deficiency    Hypothyroidism 04/05/2013    PCP: Langley Adie, MD  REFERRING PROVIDER: [phone] (ICD-10-CM) - Bladder pain   REFERRING DIAG: Marguerita Beards, MD   THERAPY DIAG:  Muscle weakness (generalized)  Unspecified lack of coordination  Pelvic pain  Rationale for Evaluation and Treatment: Rehabilitation  ONSET DATE: 08/13/22  SUBJECTIVE:                                                                                                                                                                                           SUBJECTIVE STATEMENT: I have less urinary leakage. I have pain when I urinate. I burn while urinating. I still have the urine smell when I change the top pad.I have not wanted to do anything this week due to my depression. I keep procrastinating. 1 night last week I feel out of the bed one time last week. I have had increased swelling, trouble sleeping and more winded.   .   Fluid intake: Yes: water, coffee in morning    PAIN:  Are you having pain? Yes NPRS scale: 4/10  on 02/26/23 Pain location: Anal and Vaginal  Pain type: aching, burning, and sharp Pain description: intermittent   Aggravating factors: food she eats, straining Relieving factors: relax, medicine  PRECAUTIONS: None  RED FLAGS: None   WEIGHT BEARING RESTRICTIONS: No  FALLS:  Has patient fallen in last  6 months? No  LIVING ENVIRONMENT: Lives with: lives alone  OCCUPATION: retired  PLOF: Independent  PATIENT GOALS: manage rectal prolapse and pain, not strain to urinate  PERTINENT HISTORY:  Stage III kidney disease; Hemorrhoid surgery 1976; Inguinal hernia repair; IBS; IC; OCD; Osteopenia; aterior and posterior repair with sacrospinous fixation 2023; bladder suspension 2023 Sexual abuse: No  BOWEL MOVEMENT: Pain with bowel movement: Yes, 6/10 sometimes pain is in vaginal area at the same time Type of bowel movement:Type (Bristol Stool Scale) type 3, Frequency daily, Strain Yes, and Splinting none Fully empty rectum: No Leakage: No Fiber supplement: Yes: Beneful  URINATION: Pain with urination: Yes, 8/10 Fully empty bladder: Yes:   strains to urinate Stream: Weak Urgency: Yes: but when she urinates the amount is small Frequency: night time every hour so she does not sleep; every 2-3 hours Leakage: Urge to void, Walking to the bathroom, Coughing, Sneezing, Laughing, and Exercise Pads: Yes: day 5, night 1 pad  INTERCOURSE:not active  PREGNANCY: Vaginal deliveries 3    PROLAPSE: Rectal prolapse   OBJECTIVE:  Note: Objective measures were completed at Evaluation unless otherwise noted.  DIAGNOSTIC FINDINGS:  none    COGNITION: Overall cognitive status: Within functional limits for tasks assessed     SENSATION: Light touch: Appears intact Proprioception: Appears intact    POSTURE: rounded shoulders and forward head  PELVIC ALIGNMENT:  LUMBARAROM/PROM: lumbar ROM is full   LOWER EXTREMITY ROM: Bilateral hip ROM is full   LOWER EXTREMITY MMT: bilateral hip strength is 4/5  PALPATION:   General  firmness in abdomen, able to contract the abdomin correctly, press suprapubically makes her feel like she needs to urinate, decreased mobility of lower rib cage.                 External Perineal Exam gapping anus                             Internal Pelvic  Floor tenderness in the rectal canal, tenderness in bilateral levator ani, obturator internist, along the bladder and urethra  Patient confirms identification and approves PT to assess internal pelvic floor and treatment Yes  PELVIC MMT:   MMT eval  Vaginal 1/5  Internal Anal Sphincter 2/5 with small lift  External Anal Sphincter 2/5 with small lift  Puborectalis 2/5  (Blank rows = not tested)        TONE: Increased on the right  PROLAPSE: Rectal prolapse  TODAY'S TREATMENT:    03/05/23 Exercises: Strengthening: Education on urge to void to deter the need to urinate to increase the amount of time between urination.  Supine ball squeeze with pelvic floor contraction hold 5 sec 15 X Supine bridge with ball squeeze and pelvic floor contraction Supine clamshell with red band and pelvic floor contraction    02/26/23 Manual: Soft tissue mobilization: Circular massage to the abdomen to promote peristalic motion of the intestines Manual work to the diaphragm to improve mobility Myofascial release: Fascial release around the lower abdomen to lift the intestines off the bladder, release of the sides of the abdomen to reduce the restrictions, tissue rolling of the abdomen, release of the mesenteric root Exercises: Stretches/mobility: Sit on ball and massage the pelvic floor, sometimes the patient will use her heel to massage the pelvic floor Supine butterfly holding 30 sec  Supine piriformis stretch holding 30 sec bil.    PATIENT EDUCATION:  03/05/23 Education details: Access Code: [email or web address], educated  patient to lay on her back when she feels her rectal prolapse to reset and reduce her pain, urge to void Person educated: Patient Education method: Explanation, Demonstration, Tactile cues, Verbal cues, and Handouts Education comprehension: verbalized understanding, returned demonstration, verbal cues required, tactile cues required, and needs further education  HOME EXERCISE  PROGRAM: 03/05/23 Access Code: [email or web address] URL: [email or web address] Date: 03/05/2023 Prepared by: Eulis Foster  Program Notes sit on ball and massage the pelvic floor.   Exercises - Supine Pelvic Floor Contraction  - 3 x daily - 7 x weekly - 1 sets - 5 reps - 10 sec hold - Supine Pelvic Floor Stretch  - 1 x daily - 7 x weekly - 1 sets - 1 reps - 30 sec hold - Supine Figure 4 Piriformis Stretch with Leg Extension  - 1 x daily - 7 x weekly - 1 sets - 1 reps - 30 sec hold - Supine Hip Adduction Isometric with Ball  - 1 x daily - 7 x weekly - 1 sets - 10 reps - Supine Bridge with Mini Swiss Ball Between Knees  - 1 x daily - 7 x weekly - 1 sets - 10 reps - Hooklying Clamshell with Resistance  - 1 x daily - 7 x weekly - 1 sets - 10 reps Access Code: [email or web address] URL: [email or web address] Date: 02/26/2023 Prepared by: Eulis Foster  Program Notes sit on ball and massage the pelvic floor.   Exercises - Supine Pelvic Floor Contraction  - 3 x daily - 7 x weekly - 1 sets - 5 reps - 10 sec hold - Supine Pelvic Floor Stretch  - 1 x daily - 7 x weekly - 1 sets - 1 reps - 30 sec hold - Supine Figure 4 Piriformis Stretch with Leg Extension  - 1 x daily - 7 x weekly - 1 sets - 1 reps - 30 sec hold  ASSESSMENT:  CLINICAL IMPRESSION: Patient is a 72 y.o. female who was seen today for physical therapy  treatment for bladder pain. I have had a lot of bleeding due to increased of rectal prolapse and increased gas. Patient will push the prolapse back in when sticking out. Patient rectal prolapse is bleeding bad.   Patient is depressed and not having the motivation to exercise. Patient was advised to call her doctor due to the depression. Patient will benefit from skilled therapy to work on pelvic floor strength and reduction of pain to improve urination and bowel movements.   OBJECTIVE IMPAIRMENTS: decreased coordination, decreased strength, increased fascial restrictions, increased  muscle spasms, and pain.   ACTIVITY LIMITATIONS: sitting, standing, continence, toileting, and locomotion level  PARTICIPATION LIMITATIONS: meal prep, cleaning, laundry, shopping, and community activity  PERSONAL FACTORS: Age, Fitness, Time since onset of injury/illness/exacerbation, and 1-2 comorbidities: Stage III kidney disease; Hemorrhoid surgery 1976; Inguinal hernia repair; IBS; IC; OCD; Osteopenia; aterior and posterior repair with sacrospinous fixation 2023; bladder suspension 2023  are also affecting patient's functional outcome.   REHAB POTENTIAL: Good  CLINICAL DECISION MAKING: Evolving/moderate complexity  EVALUATION COMPLEXITY: Moderate   GOALS: Goals reviewed with patient? Yes  SHORT TERM GOALS: Target date: 03/18/23  Patient independent with initial HEP for pelvic floor and core strength.  Baseline: Goal status: Met 03/05/23  2.  Patient is independent with flexibility exercises to relax the pelvic floor muscles.  Baseline:  Goal status: INITIAL  3.  Patient is educated on ways to manage her rectal prolapse.  Baseline:  Goal status: INITIAL  4.  Patient  is educated on ways to manage her pelvic pain.  Baseline:  Goal status: INITIAL   LONG TERM GOALS: Target date: 04/16/23  Patient is independent with advanced HEP for core and pelvic floor to reduce leakage.  Baseline:  Goal status: INITIAL  2.  Patient is able to have a bowel movement or urinate with pain level </= 1-2/10 due to the ability to relax her muscles and use breath.  Baseline:  Goal status: INITIAL  3.  Patient is able to urinate </= 2 times per night due to reduction of the urge to void.  Baseline:  Goal status: INITIAL  4.  Patient reports her urinary leakage decreased >/= 75% due to improved pelvic floor strength >/= 3/5 and reduction of trigger points.  Baseline:  Goal status: INITIAL  5.  Patient is able to urinate with straining </= 75% due to improve relaxation of pelvic floor.   Baseline:  Goal status: INITIAL   PLAN:  PT FREQUENCY: 1x/week  PT DURATION: 8 weeks  PLANNED INTERVENTIONS: 97110-Therapeutic exercises, 97530- Therapeutic activity, 97112- Neuromuscular re-education, 97140- Manual therapy, 97014- Electrical stimulation (unattended), 97035- Ultrasound, Patient/Family education, Dry Needling, Cryotherapy, Moist heat, and Biofeedback  PLAN FOR NEXT SESSION: manual work to abdomen on left to work on diaphragmatic breathing, see how the urge to void, manual work to pelvic floor to work on contraction; ask to see if she saw the doctor about her depression  Britnie Sitterly, PT 03/05/23 10:57 AM

## 2023-03-08 ENCOUNTER — Other Ambulatory Visit: Payer: Self-pay

## 2023-03-08 DIAGNOSIS — F3342 Major depressive disorder, recurrent, in full remission: Secondary | ICD-10-CM

## 2023-03-08 MED ORDER — AMITRIPTYLINE HCL 50 MG PO TABS
50.0000 mg | ORAL_TABLET | Freq: Every day | ORAL | 0 refills | Status: DC
Start: 2023-03-08 — End: 2023-04-15

## 2023-03-12 ENCOUNTER — Ambulatory Visit: Payer: PPO | Admitting: Physical Therapy

## 2023-03-12 ENCOUNTER — Encounter: Payer: Self-pay | Admitting: Physical Therapy

## 2023-03-12 DIAGNOSIS — M6281 Muscle weakness (generalized): Secondary | ICD-10-CM

## 2023-03-12 DIAGNOSIS — R279 Unspecified lack of coordination: Secondary | ICD-10-CM

## 2023-03-12 DIAGNOSIS — R102 Pelvic and perineal pain: Secondary | ICD-10-CM

## 2023-03-12 NOTE — Therapy (Signed)
OUTPATIENT PHYSICAL THERAPY FEMALE PELVIC TREATMENT   Patient Name: [name] MRN: [id number] DOB:January 03, 1951, 72 y.o., female Today's Date: 03/12/2023  END OF SESSION:  PT End of Session - 03/12/23 1017     Visit Number 4    Date for PT Re-Evaluation 04/16/23    Authorization Type [employer]    Authorization - Visit Number 4    Authorization - Number of Visits 20    PT Start Time 1015    PT Stop Time 1055    PT Time Calculation (min) 40 min    Activity Tolerance Patient tolerated treatment well    Behavior During Therapy WFL for tasks assessed/performed             Past Medical History:  Diagnosis Date   Chronic diastolic CHF (congestive heart failure) (HCC)    cardriologist--- dr Lacretia Nicks. Flora Lipps;   dx in setting sepsis 11/ 2020;   echo in epic 03-10-2019 ef 55-60%, G1DD, mild to moderate TR no stenosis and normal RASP;  CTA 01-31-2021 epic   Chronic kidney disease (CKD), stage III (moderate) (HCC)    nephrologist--- dr Isa Rankin   Depression    Diverticulosis of colon    Emphysema of lung (HCC)    no home oxygen   GERD (gastroesophageal reflux disease)    History of acute pyelonephritis    w/ hx sepsis   History of Helicobacter pylori infection 2012   History of recurrent UTIs    History of TIA (transient ischemic attack)    TIAs   Hyperlipidemia, mixed    Hypertension    followed by pcp and nephrologist----  (nuclear stress test in epic 06-28-2015 low risk no ischemia, nuclear ef 60%) no meds   Hypothyroidism    IBS (irritable bowel syndrome)    IC (interstitial cystitis)    remote hx   Inguinal hernia, bilateral    Irritable bowel syndrome with constipation and diarrhea    LBBB (left bundle branch block) 2015   cardiologist --- dr Flora Lipps;  known chronic LBBB for several yrs   OCD (obsessive compulsive disorder)    OSA (obstructive sleep apnea)    no CPAP intolerant   Osteopenia    Prolapsed hemorrhoids    Seasonal allergies    Vitamin D deficiency     Past Surgical History:  Procedure Laterality Date   ANTERIOR AND POSTERIOR REPAIR WITH SACROSPINOUS FIXATION N/A 11/10/2021   Procedure: POSTERIOR REPAIR WITH SACROSPINOUS FIXATION;  Surgeon: Marguerita Beards, MD;  Location: [facility];  Service: Gynecology;  Laterality: N/A;  total time requested is 1.5 hrs   BLADDER SUSPENSION N/A 11/10/2021   Procedure: TRANSVAGINAL TAPE (TVT) PROCEDURE;  Surgeon: Marguerita Beards, MD;  Location: [facility];  Service: Gynecology;  Laterality: N/A;   BREAST ENHANCEMENT SURGERY Bilateral    1970s   BREAST IMPLANT REMOVAL Bilateral 03/07/2019   BUNIONECTOMY Bilateral 1997   CATARACT EXTRACTION W/ INTRAOCULAR LENS IMPLANT Right 2005   COLONOSCOPY  10/2020   CYSTOSCOPY WITH FULGERATION  11/10/2021   Procedure: CYSTOSCOPY WITH FULGERATION;  Surgeon: Marguerita Beards, MD;  Location: [facility];  Service: Gynecology;;   ESOPHAGOGASTRODUODENOSCOPY (EGD) WITH PROPOFOL N/A 03/12/2019   Procedure: ESOPHAGOGASTRODUODENOSCOPY (EGD) WITH PROPOFOL;  Surgeon: Jeani Hawking, MD;  Location: [facility] ENDOSCOPY;  Service: Endoscopy;  Laterality: N/A;   HEMORRHOID SURGERY  1976   INGUINAL HERNIA REPAIR N/A 04/10/2021   Procedure: LAPAROSCOPIC LEFT INGUINAL HERNIA REPAIR AND RIGHT INGUINAL HERNII REPAIR WITH TAP BLOCK;  Surgeon: Karie Soda, MD;  Location: [facility];  Service: General;  Laterality: N/A;   ORIF ANKLE FRACTURE Left 08/30/2017   Procedure: OPEN REDUCTION INTERNAL FIXATION (ORIF) ANKLE FRACTURE;  Surgeon: Samson Frederic, MD;  Location: [facility] OR;  Service: Orthopedics;  Laterality: Left;   TONSILLECTOMY     CHILD   Patient Active Problem List   Diagnosis Date Noted   Coronary atherosclerosis of native coronary artery (per CTA 01/31/2021)  06/14/2021   Interstitial cystitis 06/14/2020   Overweight (BMI 25.0-29.9) 06/13/2020   Former smoker (40 pack year, quit 2004) 08/30/2019    Atherosclerosis of aorta (HCC) by Chest CT scan in 2020  03/22/2019   Emphysema lung (HCC) - per CT 03/09/2019    Chronic diastolic CHF (congestive heart failure) (HCC) 03/11/2019   Osteopenia 01/16/2019   History of colon polyps 06/28/2018   CKD Stage 3 (HCC) 01/10/2015   IBS  01/10/2015   Hyperlipidemia, mixed 06/12/2014   Medication management 06/12/2014   LBBB (left bundle branch block) 07/24/2013   HTN (hypertension) 07/24/2013   Major depression, recurrent, full remission (HCC)    OCD (obsessive compulsive disorder)    Abnormal glucose    Migraines    Vitamin D deficiency    Hypothyroidism 04/05/2013    PCP: Langley Adie, MD  REFERRING PROVIDER: [id number] (ICD-10-CM) - Bladder pain   REFERRING DIAG: Marguerita Beards, MD   THERAPY DIAG:  Muscle weakness (generalized)  Unspecified lack of coordination  Pelvic pain  Rationale for Evaluation and Treatment: Rehabilitation  ONSET DATE: 08/13/22  SUBJECTIVE:                                                                                                                                                                                           SUBJECTIVE STATEMENT: My pelvic floor has been painful. Pain when I stand and walk. I feel like the bladder is falling out. I avoid going out due to the incontinence. I have increased pain when I urinate. Patient does not have to strain to have a bowel movement but strain to urinate. Her stools go back and forth with diarrhea and constipation.  .   Fluid intake: Yes: water, coffee in morning    PAIN:  Are you having pain? Yes NPRS scale: 6/10  on 02/26/23 Pain location: Anal and Vaginal  Pain type: aching, burning, and sharp Pain description: intermittent   Aggravating factors: food she eats, straining Relieving factors: relax, medicine  PRECAUTIONS: None  RED FLAGS: None   WEIGHT BEARING RESTRICTIONS: No  FALLS:  Has patient fallen in last 6 months? No  LIVING  ENVIRONMENT: Lives  with: lives alone  OCCUPATION: retired  PLOF: Independent  PATIENT GOALS: manage rectal prolapse and pain, not strain to urinate  PERTINENT HISTORY:  Stage III kidney disease; Hemorrhoid surgery 1976; Inguinal hernia repair; IBS; IC; OCD; Osteopenia; anterior and posterior repair with sacrospinous fixation 2023; bladder suspension 2023 Sexual abuse: No  BOWEL MOVEMENT: Pain with bowel movement: Yes, 6/10 sometimes pain is in vaginal area at the same time Type of bowel movement:Type (Bristol Stool Scale) type 3, Frequency daily, Strain Yes, and Splinting none Fully empty rectum: No Leakage: No Fiber supplement: Yes: Beneful  URINATION: Pain with urination: Yes, 8/10 Fully empty bladder: Yes:   strains to urinate Stream: Weak Urgency: Yes: but when she urinates the amount is small Frequency: night time every hour so she does not sleep; every 2-3 hours Leakage: Urge to void, Walking to the bathroom, Coughing, Sneezing, Laughing, and Exercise Pads: Yes: day 5, night 1 pad  INTERCOURSE:not active  PREGNANCY: Vaginal deliveries 3  PROLAPSE: Rectal prolapse   OBJECTIVE:  Note: Objective measures were completed at Evaluation unless otherwise noted.  DIAGNOSTIC FINDINGS:  none  COGNITION: Overall cognitive status: Within functional limits for tasks assessed     SENSATION: Light touch: Appears intact Proprioception: Appears intact    POSTURE: rounded shoulders and forward head  PELVIC ALIGNMENT:  LUMBARAROM/PROM: lumbar ROM is full   LOWER EXTREMITY ROM: Bilateral hip ROM is full   LOWER EXTREMITY MMT: bilateral hip strength is 4/5  PALPATION:   General  firmness in abdomen, able to contract the abdomin correctly, press suprapubically makes her feel like she needs to urinate, decreased mobility of lower rib cage.                 External Perineal Exam gapping anus                             Internal Pelvic Floor tenderness in the rectal  canal, tenderness in bilateral levator ani, obturator internist, along the bladder and urethra  Patient confirms identification and approves PT to assess internal pelvic floor and treatment Yes  PELVIC MMT:   MMT eval 03/12/23  Vaginal 1/5 2/5  Internal Anal Sphincter 2/5 with small lift   External Anal Sphincter 2/5 with small lift   Puborectalis 2/5   (Blank rows = not tested)        TONE: Increased on the right  PROLAPSE: Rectal prolapse  TODAY'S TREATMENT:    03/12/23 Manual: Internal pelvic floor techniques: No emotional/communication barriers or cognitive limitation. Patient is motivated to learn. Patient understands and agrees with treatment goals and plan. PT explains patient will be examined in standing, sitting, and lying down to see how their muscles and joints work. When they are ready, they will be asked to remove their underwear so PT can examine their perineum. The patient is also given the option of providing their own chaperone as one is not provided in our facility. The patient also has the right and is explained the right to defer or refuse any part of the evaluation or treatment including the internal exam. With the patient's consent, PT will use one gloved finger to gently assess the muscles of the pelvic floor, seeing how well it contracts and relaxes and if there is muscle symmetry. After, the patient will get dressed and PT and patient will discuss exam findings and plan of care. PT and patient discuss plan of care, schedule, attendance policy and  HEP activities.  Going through the vaginal canal working on the levator ani, obturator internist. Fascial release on the sides of the bladder and along the urethra with one finger internally and other suprapubic area going slowly and monitoring for pain.     03/05/23 Exercises: Strengthening: Education on urge to void to deter the need to urinate to increase the amount of time between urination.  Supine ball squeeze  with pelvic floor contraction hold 5 sec 15 X Supine bridge with ball squeeze and pelvic floor contraction Supine clamshell with red band and pelvic floor contraction    02/26/23 Manual: Soft tissue mobilization: Circular massage to the abdomen to promote peristalic motion of the intestines Manual work to the diaphragm to improve mobility Myofascial release: Fascial release around the lower abdomen to lift the intestines off the bladder, release of the sides of the abdomen to reduce the restrictions, tissue rolling of the abdomen, release of the mesenteric root Exercises: Stretches/mobility: Sit on ball and massage the pelvic floor, sometimes the patient will use her heel to massage the pelvic floor Supine butterfly holding 30 sec  Supine piriformis stretch holding 30 sec bil.    PATIENT EDUCATION:  03/05/23 Education details: Access Code: [email or web address], educated patient to lay on her back when she feels her rectal prolapse to reset and reduce her pain, urge to void Person educated: Patient Education method: Explanation, Demonstration, Tactile cues, Verbal cues, and Handouts Education comprehension: verbalized understanding, returned demonstration, verbal cues required, tactile cues required, and needs further education  HOME EXERCISE PROGRAM: 03/05/23 Access Code: [email or web address] URL: [email or web address] Date: 03/05/2023 Prepared by: Eulis Foster  Program Notes sit on ball and massage the pelvic floor.   Exercises - Supine Pelvic Floor Contraction  - 3 x daily - 7 x weekly - 1 sets - 5 reps - 10 sec hold - Supine Pelvic Floor Stretch  - 1 x daily - 7 x weekly - 1 sets - 1 reps - 30 sec hold - Supine Figure 4 Piriformis Stretch with Leg Extension  - 1 x daily - 7 x weekly - 1 sets - 1 reps - 30 sec hold - Supine Hip Adduction Isometric with Ball  - 1 x daily - 7 x weekly - 1 sets - 10 reps - Supine Bridge with Mini Swiss Ball Between Knees  - 1 x daily - 7 x weekly - 1 sets -  10 reps - Hooklying Clamshell with Resistance  - 1 x daily - 7 x weekly - 1 sets - 10 reps Access Code: [email or web address] URL: [email or web address] Date: 02/26/2023 Prepared by: Eulis Foster  Program Notes sit on ball and massage the pelvic floor.   Exercises - Supine Pelvic Floor Contraction  - 3 x daily - 7 x weekly - 1 sets - 5 reps - 10 sec hold - Supine Pelvic Floor Stretch  - 1 x daily - 7 x weekly - 1 sets - 1 reps - 30 sec hold - Supine Figure 4 Piriformis Stretch with Leg Extension  - 1 x daily - 7 x weekly - 1 sets - 1 reps - 30 sec hold  ASSESSMENT:  CLINICAL IMPRESSION: Patient is a 72 y.o. female who was seen today for physical therapy  treatment for bladder pain.Pelvic floor strength is 2/5. She had no bladder pain after the manual work. She is doing her HEP. She had many restrictions around the bladder and urethra and tightness along the puborectalis.  Patient will benefit from skilled therapy to work on  pelvic floor strength and reduction of pain to improve urination and bowel movements.   OBJECTIVE IMPAIRMENTS: decreased coordination, decreased strength, increased fascial restrictions, increased muscle spasms, and pain.   ACTIVITY LIMITATIONS: sitting, standing, continence, toileting, and locomotion level  PARTICIPATION LIMITATIONS: meal prep, cleaning, laundry, shopping, and community activity  PERSONAL FACTORS: Age, Fitness, Time since onset of injury/illness/exacerbation, and 1-2 comorbidities: Stage III kidney disease; Hemorrhoid surgery 1976; Inguinal hernia repair; IBS; IC; OCD; Osteopenia; aterior and posterior repair with sacrospinous fixation 2023; bladder suspension 2023  are also affecting patient's functional outcome.   REHAB POTENTIAL: Good  CLINICAL DECISION MAKING: Evolving/moderate complexity  EVALUATION COMPLEXITY: Moderate   GOALS: Goals reviewed with patient? Yes  SHORT TERM GOALS: Target date: 03/18/23  Patient independent with initial  HEP for pelvic floor and core strength.  Baseline: Goal status: Met 03/05/23  2.  Patient is independent with flexibility exercises to relax the pelvic floor muscles.  Baseline:  Goal status: Met 03/12/23  3.  Patient is educated on ways to manage her rectal prolapse.  Baseline:  Goal status: INITIAL  4.  Patient is educated on ways to manage her pelvic pain.  Baseline:  Goal status: Met 03/12/23   LONG TERM GOALS: Target date: 04/16/23  Patient is independent with advanced HEP for core and pelvic floor to reduce leakage.  Baseline:  Goal status: INITIAL  2.  Patient is able to have a bowel movement or urinate with pain level </= 1-2/10 due to the ability to relax her muscles and use breath.  Baseline:  Goal status: INITIAL  3.  Patient is able to urinate </= 2 times per night due to reduction of the urge to void.  Baseline:  Goal status: INITIAL  4.  Patient reports her urinary leakage decreased >/= 75% due to improved pelvic floor strength >/= 3/5 and reduction of trigger points.  Baseline:  Goal status: INITIAL  5.  Patient is able to urinate with straining </= 75% due to improve relaxation of pelvic floor.  Baseline:  Goal status: INITIAL   PLAN:  PT FREQUENCY: 1x/week  PT DURATION: 8 weeks  PLANNED INTERVENTIONS: 97110-Therapeutic exercises, 97530- Therapeutic activity, 97112- Neuromuscular re-education, 97140- Manual therapy, 97014- Electrical stimulation (unattended), 97035- Ultrasound, Patient/Family education, Dry Needling, Cryotherapy, Moist heat, and Biofeedback  PLAN FOR NEXT SESSION: manual work to abdomen on left to work on diaphragmatic breathing, see how the urge to void, manual work to pelvic floor around bladder and urethra and levator ani  Eulis Foster, PT 03/12/23 10:17 AM

## 2023-03-16 ENCOUNTER — Ambulatory Visit: Payer: PPO | Admitting: Obstetrics and Gynecology

## 2023-03-16 ENCOUNTER — Encounter: Payer: Self-pay | Admitting: Obstetrics and Gynecology

## 2023-03-16 ENCOUNTER — Other Ambulatory Visit (HOSPITAL_COMMUNITY)
Admission: RE | Admit: 2023-03-16 | Discharge: 2023-03-16 | Disposition: A | Discharge status: Home / Self Care | Payer: PPO | Source: Other Acute Inpatient Hospital | Attending: Obstetrics and Gynecology | Admitting: Obstetrics and Gynecology

## 2023-03-16 ENCOUNTER — Other Ambulatory Visit (HOSPITAL_COMMUNITY): Admission: RE | Admit: 2023-03-16 | Payer: PPO | Source: Home / Self Care

## 2023-03-16 ENCOUNTER — Other Ambulatory Visit (HOSPITAL_COMMUNITY): Payer: Self-pay

## 2023-03-16 VITALS — BP 119/77 | HR 80

## 2023-03-16 DIAGNOSIS — R319 Hematuria, unspecified: Secondary | ICD-10-CM

## 2023-03-16 DIAGNOSIS — M62838 Other muscle spasm: Secondary | ICD-10-CM | POA: Diagnosis not present

## 2023-03-16 DIAGNOSIS — R82998 Other abnormal findings in urine: Secondary | ICD-10-CM | POA: Diagnosis not present

## 2023-03-16 DIAGNOSIS — R350 Frequency of micturition: Secondary | ICD-10-CM

## 2023-03-16 LAB — URINALYSIS, COMPLETE (UACMP) WITH MICROSCOPIC
Bacteria, UA: NONE SEEN
Bilirubin Urine: NEGATIVE
Glucose, UA: NEGATIVE mg/dL
Ketones, ur: NEGATIVE mg/dL
Nitrite: NEGATIVE
Protein, ur: 100 mg/dL — AB
RBC / HPF: >50 RBC/hpf (ref 0–5)
Specific Gravity, Urine: 1.018 (ref 1.005–1.030)
WBC, UA: >50 WBC/hpf (ref 0–5)
pH: 6 (ref 5.0–8.0)

## 2023-03-16 LAB — POCT URINALYSIS DIPSTICK
Bilirubin, UA: NEGATIVE
Glucose, UA: NEGATIVE
Ketones, UA: NEGATIVE
Leukocytes, UA: NEGATIVE
Nitrite, UA: NEGATIVE
Protein, UA: NEGATIVE
Spec Grav, UA: 1.015 (ref 1.010–1.025)
Urobilinogen, UA: 0.2 U/dL
pH, UA: 7.5 (ref 5.0–8.0)

## 2023-03-16 MED ORDER — DIAZEPAM 5 MG PO TABS
ORAL_TABLET | ORAL | 1 refills | Status: AC
Start: 2023-03-16 — End: ?

## 2023-03-16 NOTE — Patient Instructions (Signed)
I will prescribe valium 5 mg pills to place vaginally up to 2 times a day for vaginal muscle spasms. Start at night and take one every night for the next several weeks to see if it improves your symptoms. Once you are improving you can taper off the medication and just use as needed. If the medication makes you drowsy then only use at bedtime and/or we can reduce the dose. Do not use gel to place it, as that will prevent the tablet from dissolving.  Instead place 1-2 drops of water on the table before inserting it in the vagina. If the pills do not dissolve well we can switch to a special compounded suppository. Let me know how you are doing on the medication and if you have any questions.     

## 2023-03-16 NOTE — Progress Notes (Signed)
[facility] Return Visit  SUBJECTIVE  History of Present Illness: [name] is a 72 y.o. female seen in follow-up for bladder pain and incontinence.   Surgery: s/p Posterior repair, sacrospinous ligament fixation, midurethral sling, cystoscopy on 11/10/21 - Underwent intravesical botox in the office on 08/13/22- no significant improvement.   She was diagnosed with rectal prolapse by Dr Maisie Fus. They recommended pelvic PT and she has started that. She has noticed an improvement with the incontinence. Pt reports her pelvic floor muscles have been very tight.   She has both bladder and rectal pain. Has a lot of pain with urination.   Still on the amitriptyline. She is taking hydoxyzine 2-3 times per day.  Not taking hyoscyamine. Not using the lidocaine cream. Using the estrogen twice a week.   Her son has stage IV colon cancer and is undergoing chemo and radiation. She went to visit him recently.   Past Medical History: Patient  has a past medical history of Chronic diastolic CHF (congestive heart failure) (HCC), Chronic kidney disease (CKD), stage III (moderate) (HCC), Depression, Diverticulosis of colon, Emphysema of lung (HCC), GERD (gastroesophageal reflux disease), History of acute pyelonephritis, History of Helicobacter pylori infection (2012), History of recurrent UTIs, History of TIA (transient ischemic attack), Hyperlipidemia, mixed, Hypertension, Hypothyroidism, IBS (irritable bowel syndrome), IC (interstitial cystitis), Inguinal hernia, bilateral, Irritable bowel syndrome with constipation and diarrhea, LBBB (left bundle branch block) (2015), OCD (obsessive compulsive disorder), OSA (obstructive sleep apnea), Osteopenia, Prolapsed hemorrhoids, Seasonal allergies, and Vitamin D deficiency.   Past Surgical History: She  has a past surgical history that includes Tonsillectomy; Bunionectomy (Bilateral, 1997); Hemorrhoid surgery (1976); Breast enhancement surgery (Bilateral);  ORIF ankle fracture (Left, 08/30/2017); Breast implant removal (Bilateral, 03/07/2019); Esophagogastroduodenoscopy (egd) with propofol (N/A, 03/12/2019); Colonoscopy (10/2020); Cataract extraction w/ intraocular lens implant (Right, 2005); Inguinal hernia repair (N/A, 04/10/2021); Anterior and posterior repair with sacrospinous fixation (N/A, 11/10/2021); Bladder suspension (N/A, 11/10/2021); and Cystoscopy with fulgeration (11/10/2021).   Medications: She has a current medication list which includes the following prescription(s): amitriptyline, aspirin ec, atenolol, bupropion, cetirizine, vitamin d-3, cinnamon, diazepam, estradiol, guaifenesin, hydroxyzine, hyoscyamine, ipratropium-albuterol, levothyroxine, lidocaine-prilocaine, ondansetron, pantoprazole, and rosuvastatin.   Allergies: Patient is allergic to ace inhibitors, cephalexin, dilaudid [hydromorphone hcl], robaxin [methocarbamol], macrobid [nitrofurantoin], levaquin [levofloxacin], neurontin [gabapentin], ciprofloxacin, codeine, latex, and sulfa antibiotics.   Social History: Patient  reports that she quit smoking about 20 years ago. Her smoking use included cigarettes. She started smoking about 60 years ago. She has a 40 pack-year smoking history. She has never used smokeless tobacco. She reports current alcohol use. She reports that she does not use drugs.      OBJECTIVE     Physical Exam: Vitals:   03/16/23 1505  BP: 119/77  Pulse: 80    Gen: No apparent distress, A&O x 3.  Detailed Urogynecologic Evaluation:  No prolapse noted. On digital exam, increased pelvic floor muscle tension with trigger points bilaterally   ASSESSMENT AND PLAN    [name] is a 72 y.o. with:  1. Levator spasm   2. Leukocytes in urine   3. Urinary frequency   4. Hematuria, unspecified type     - Continue PO meds- amitriptyline, hyosciamine, atarax.  - can do Azo prn - Continue estrace cream twice a week - Start valium for pelvic floor muscle  spasm. Place 5mg  vaginally nightly as needed. Can place two times per day if it does not cause drowsiness.  - Continue with pelvic PT - Advised  stress management with yoga and meditation - Next visit, will consider medication for bladder urgency if it does not improve with valium.  - Urine today with negative leukocytes and nitrates but blood is present so will send for UA micro and culture  Follow up 6 weeks  Marguerita Beards, MD

## 2023-03-18 ENCOUNTER — Encounter: Payer: Self-pay | Admitting: Nurse Practitioner

## 2023-03-18 ENCOUNTER — Ambulatory Visit (INDEPENDENT_AMBULATORY_CARE_PROVIDER_SITE_OTHER): Payer: PPO | Admitting: Nurse Practitioner

## 2023-03-18 VITALS — BP 100/60 | HR 73 | Temp 97.9°F | Ht 63.0 in | Wt 175.2 lb

## 2023-03-18 DIAGNOSIS — N3010 Interstitial cystitis (chronic) without hematuria: Secondary | ICD-10-CM

## 2023-03-18 DIAGNOSIS — Z860100 Personal history of colon polyps, unspecified: Secondary | ICD-10-CM

## 2023-03-18 DIAGNOSIS — I700 Atherosclerosis of aorta: Secondary | ICD-10-CM | POA: Diagnosis not present

## 2023-03-18 DIAGNOSIS — M6289 Other specified disorders of muscle: Secondary | ICD-10-CM

## 2023-03-18 DIAGNOSIS — J432 Centrilobular emphysema: Secondary | ICD-10-CM | POA: Diagnosis not present

## 2023-03-18 DIAGNOSIS — E039 Hypothyroidism, unspecified: Secondary | ICD-10-CM

## 2023-03-18 DIAGNOSIS — Z79899 Other long term (current) drug therapy: Secondary | ICD-10-CM | POA: Diagnosis not present

## 2023-03-18 DIAGNOSIS — F3342 Major depressive disorder, recurrent, in full remission: Secondary | ICD-10-CM | POA: Diagnosis not present

## 2023-03-18 DIAGNOSIS — N1831 Chronic kidney disease, stage 3a: Secondary | ICD-10-CM | POA: Diagnosis not present

## 2023-03-18 DIAGNOSIS — K582 Mixed irritable bowel syndrome: Secondary | ICD-10-CM | POA: Diagnosis not present

## 2023-03-18 DIAGNOSIS — Z87891 Personal history of nicotine dependence: Secondary | ICD-10-CM

## 2023-03-18 DIAGNOSIS — G43909 Migraine, unspecified, not intractable, without status migrainosus: Secondary | ICD-10-CM | POA: Diagnosis not present

## 2023-03-18 DIAGNOSIS — Z23 Encounter for immunization: Secondary | ICD-10-CM

## 2023-03-18 DIAGNOSIS — I447 Left bundle-branch block, unspecified: Secondary | ICD-10-CM

## 2023-03-18 DIAGNOSIS — D649 Anemia, unspecified: Secondary | ICD-10-CM | POA: Diagnosis not present

## 2023-03-18 DIAGNOSIS — R300 Dysuria: Secondary | ICD-10-CM

## 2023-03-18 DIAGNOSIS — I10 Essential (primary) hypertension: Secondary | ICD-10-CM

## 2023-03-18 DIAGNOSIS — D508 Other iron deficiency anemias: Secondary | ICD-10-CM

## 2023-03-18 DIAGNOSIS — F429 Obsessive-compulsive disorder, unspecified: Secondary | ICD-10-CM

## 2023-03-18 DIAGNOSIS — R7309 Other abnormal glucose: Secondary | ICD-10-CM | POA: Diagnosis not present

## 2023-03-18 DIAGNOSIS — K5730 Diverticulosis of large intestine without perforation or abscess without bleeding: Secondary | ICD-10-CM | POA: Diagnosis not present

## 2023-03-18 DIAGNOSIS — M8580 Other specified disorders of bone density and structure, unspecified site: Secondary | ICD-10-CM

## 2023-03-18 DIAGNOSIS — R102 Pelvic and perineal pain: Secondary | ICD-10-CM

## 2023-03-18 DIAGNOSIS — I5032 Chronic diastolic (congestive) heart failure: Secondary | ICD-10-CM

## 2023-03-18 DIAGNOSIS — E559 Vitamin D deficiency, unspecified: Secondary | ICD-10-CM

## 2023-03-18 LAB — URINE CULTURE: Culture: >=100000 — AB

## 2023-03-18 MED ORDER — SULFAMETHOXAZOLE-TRIMETHOPRIM 800-160 MG PO TABS
1.0000 | ORAL_TABLET | Freq: Two times a day (BID) | ORAL | 0 refills | Status: AC
Start: 2023-03-18 — End: 2023-03-21

## 2023-03-18 NOTE — Patient Instructions (Signed)

## 2023-03-18 NOTE — Addendum Note (Signed)
Addended by: Marguerita Beards on: 03/18/2023 08:27 AM   Modules accepted: Orders

## 2023-03-18 NOTE — Progress Notes (Signed)
Patient agreed to take the sulfa stating it was just red circles that appeared not itchy or painful. Doesn't want to go infection disease.

## 2023-03-18 NOTE — Progress Notes (Signed)
Patient stated she cannot take Bactrim, she has taken it before and reacted with circular rashes all over. Please advise.

## 2023-03-18 NOTE — Progress Notes (Addendum)
FOLLOW UP  Assessment:   Diagnoses and all orders for this visit:  Centrilobular emphysema (HCC) Per CT 2020, denies sx, monitor   Atherosclerosis of aorta (HCC) - Per Ct 2020/Atherosclerosis of coronary artery atherosclerosis of native artery without angina pectoris/Mixed Hyperlipidemia  Declines sx or cardiology referral/workup Will do medical management; continue bASA, no longer smoking Discussed statin, plaque stabilizing benefit -  receptive to low dose low frequency - rosuvastatin 5 mg 3 nights a week to start   Essential hypertension Off anti-hyypertensives Push fluids - continue ASA  Discussed DASH (Dietary Approaches to Stop Hypertension) DASH diet is lower in sodium than a typical American diet. Cut back on foods that are high in saturated fat, cholesterol, and trans fats. Eat more whole-grain foods, fish, poultry, and nuts Remain active and exercise as tolerated daily.  Monitor BP at home-Call if greater than 130/80.  Check CMP/CBC  LBBB (left bundle branch block) Continue to monitor Cardiology Dr. Elease Hashimoto PRN per patient preference  Migraine without status migrainosus, not intractable, unspecified migraine type Resolved Remain well hydrated  Irritable bowel syndrome, unspecified type Symptoms stable/improved  Continue monitoring diet/lifestyle modification Suggest probiotic  Hypothyroidism, unspecified type Controlled. Continue Levothyroxine. Reminded to take on an empty stomach 30-35mins before food.  Stop any Biotin Supplement 48-72 hours before next TSH level to reduce the risk of falsely low TSH levels. Continue to monitor.     CKD Stage 3 (HCC) Discussed how what you eat and drink can aide in kidney protection. Stay well hydrated. Avoid high salt foods. Avoid NSAIDS. Keep BP and BG well controlled.   Take medications as prescribed. Remain active and exercise as tolerated daily. Maintain weight.  Continue to monitor. Check  CMP/GFR/Microablumin  Major depression, recurrent, full remission (HCC) Grief counseling offered - patient declines, plans to continue family support. Continue medications; rare benzo; doing well with current regimen, Zoloft, Continue Wellbutrin. Lifestyle discussed: diet/exerise, sleep hygiene, stress management, hydration  Obsessive-compulsive disorder, unspecified type Symptoms improved on SSRI - Continue Zoloft  Diverticulosis of intestine without bleeding, unspecified intestinal tract location Increase fiber intake Stay well hydrated Continue to monitor  Medication management All medications discussed and reviewed in full. All questions and concerns regarding medications addressed.    Other abnormal glucose Education: Reviewed 'ABCs' of diabetes management  Discussed goals to be met and/or maintained include A1C (<7) Blood pressure (<130/80) Cholesterol (LDL <70) Continue Eye Exam yearly  Continue Dental Exam Q6 mo Discussed dietary recommendations Discussed Physical Activity recommendations Check A1C  Vitamin D deficiency Continue supplement for goal of 60-100 Monitor Vitamin D levels  Osteopenia Continue Bone Density screening Q2 Years Pursue a combination of weight-bearing exercises and strength training. Advised on fall prevention measures including proper lighting in all rooms, removal of area rugs and floor clutter, use of walking devices as deemed appropriate, avoidance of uneven walking surfaces. Smoking cessation and moderate alcohol consumption if applicable Consume 800 to 1000 IU of vitamin D daily with a goal vitamin D serum value of 30 ng/mL or higher. Aim for 1000 to 1200 mg of elemental calcium daily through supplements and/or dietary sources.  CHF, diastolic (HCC) Per ECHO while admitted 02/2019 No hx prior, denies notable sx since that time Dr. Eden Emms recommended no dedicated follow up unless new sx Limit sodium; monitor weight and control BP  Hx  of colon polyps Next colonoscopy due 10/2025 High fiber diet, low red meat/processed meat  Former smoker, 40 pack year, quit 2004/Emphysema CT chest 08/2020 was benign, no  further per radiology  Low threshold for CXR with sx, denies today   IC/pelvic floor dysfunction Has OV planned with uro/GYN, limited benefit with pelvic floor PT, discussing surgery   Flu vaccine need Administered without complication  Pelvic pain Refer to pain management  Orders Placed This Encounter  Procedures   Flu vaccine HIGH DOSE PF(Fluzone Trivalent)   CBC with Differential/Platelet   COMPLETE METABOLIC PANEL WITH GFR   Lipid panel   Hemoglobin A1c   TSH   Notify office for further evaluation and treatment, questions or concerns if any reported s/s fail to improve.   The patient was advised to call back or seek an in-person evaluation if any symptoms worsen or if the condition fails to improve as anticipated.   Further disposition pending results of labs. Discussed med's effects and SE's.    I discussed the assessment and treatment plan with the patient. The patient was provided an opportunity to ask questions and all were answered. The patient agreed with the plan and demonstrated an understanding of the instructions.  Discussed med's effects and SE's. Screening labs and tests as requested with regular follow-up as recommended.  I provided 40 minutes of face-to-face time during this encounter including counseling, chart review, and critical decision making was preformed.  Today's Plan of Care is based on a patient-centered health care approach known as shared decision making - the decisions, tests and treatments allow for patient preferences and values to be balanced with clinical evidence.     Future Appointments  Date Time Provider Department Center  03/31/2023  2:45 PM Theressa Millard, PT [facility] None  04/07/2023  2:00 PM Theressa Millard, PT [facility] None  04/27/2023 11:40 AM Marguerita Beards, MD [facility]  06/24/2023 10:00 AM Adela Glimpse, NP [facility] None  09/22/2023 10:30 AM Adela Glimpse, NP [facility] None    Subjective:  [name] is a 72 y.o. female who presents for a follow up. She has Hypothyroidism; Major depression, recurrent, full remission (HCC); OCD (obsessive compulsive disorder); Abnormal glucose; Migraines; Vitamin D deficiency; LBBB (left bundle branch block); HTN (hypertension); Hyperlipidemia, mixed; Medication management; CKD Stage 3 (HCC); IBS ; History of colon polyps; Osteopenia; Chronic diastolic CHF (congestive heart failure) (HCC); Atherosclerosis of aorta (HCC) by Chest CT scan in 2020 ; Emphysema lung (HCC) - per CT 03/09/2019; Former smoker (40 pack year, quit 2004); Overweight (BMI 25.0-29.9); Interstitial cystitis; and Coronary atherosclerosis of native coronary artery (per CTA 01/31/2021)  on their problem list.   She has IC, and pelvic floor dysfunction, follows  urogyn Dr. Oleh Genin, has been doing pelvic floor PT with limited benefit however continuing to remain symptomatic.  Was seen yesterday for a follow up and is currently being treated for a UTI.  She is considering surgical treatment.  She is unable to manage the pain.  Cannot get comfortable standing or sitting or laying.  Reports maxing out on Tylenol Arthritis as well as Tramadol.  No longer effective.  She had bil inguinal hernia repair by Dr. Michaell Cowing 04/10/2021, doing well with no issues.   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   BMI is Body mass index is 31.04 kg/m., she has not been working on diet. Wt Readings from Last 3 Encounters:  03/18/23 175 lb 3.2 oz (79.5 kg)  12/16/22 167 lb 12.8 oz (  76.1 kg)  09/15/22 158 lb (71.7 kg)   She has aortic atherosclerosis and centrilobular emphysema per CT chest 02/2019, former smoker, quit in  2004, 40 pack year history. Inhalers only need when ill. 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   In November 2020,  patient had removal of breast implants and had post-op complications post intubation neck & facial swelling with fever & drop in O2 to 79% and all attributed to secondary to cricopharyngeal inflammation.  At this time she underwent Echo 03/10/2019 LVEF estimated 55-60%, Abnormal (paradoxical) septal motion, consistent with left bundle branch block (long history of this on EKG), Grade I diastolic dysfunctoin, she also had some elevated troponins felt to be demand related. Had follow up with Dr. Eden Emms, was not recommended dedicated follow up. LBBB followed by Dr. Eden Emms.    Her blood pressure has been controlled at home however today their BP is BP: 100/60.  She is no longer taking anti-hypertensive.  She does not workout but is doing lots of intense housework. She denies chest pain, shortness of breath, does endorse fatigue and intermittent dizziness.   She does have aortic atherosclerosis and coronary atherosclerosis per CTA 01/31/2021. Denies angina, dyspnea. She is on bASA, BB. She is currently not on statin.   She is not on cholesterol medication, mild elevations treated by lifestyle modification, has been declining lipid program and no longer on statin. Her cholesterol is not at goal. The cholesterol last visit was:   Lab Results  Component Value Date   CHOL 197 12/16/2022   HDL 67 12/16/2022   LDLCALC 109 (H) 12/16/2022   TRIG 103 12/16/2022   CHOLHDL 2.9 12/16/2022    She has been working on diet and exercise for glucose management, and denies increased appetite, nausea, paresthesia of the feet, polydipsia, polyuria, visual disturbances and vomiting. Last A1C in the office was:  Lab Results  Component Value Date   HGBA1C 5.7 (H) 12/16/2022   She is on thyroid medication. Her medication was not changed last  visit, taking 125 mcg daily Lab Results  Component Value Date   TSH 2.60 09/15/2022   She is followed by nephrology Dr. Dierdre Highman for CKD III Lab Results  Component Value Date   EGFR 35 (L) 12/16/2022   Patient is on Vitamin D supplement and at goal at recent check:  Lab Results  Component Value Date   VD25OH 78 09/15/2022     Stopped B complex supplement taking irregularly Lab Results  Component Value Date   VITAMINB12 1,001 06/13/2021     Medication Review: Current Outpatient Medications on File Prior to Visit  Medication Sig Dispense Refill   amitriptyline (ELAVIL) 50 MG tablet Take 1 tablet (50 mg total) by mouth daily. 30 tablet 0   aspirin EC 81 MG tablet Take 81 mg by mouth daily.      atenolol (TENORMIN) 50 MG tablet Take 50 mg by mouth daily.     buPROPion (WELLBUTRIN XL) 150 MG 24 hr tablet Take 3 tablets (450 mg total) by mouth daily for depression. 270 tablet 3   cetirizine (ZYRTEC) 10 MG tablet Take 10 mg by mouth daily.     Cholecalciferol (VITAMIN D-3) 5000 UNITS TABS Take 5,000 Units by mouth daily.      Cinnamon 500 MG capsule Take 500 mg by mouth daily.     diazepam (VALIUM) 5 MG tablet Place 1 tablet vaginally nightly as needed  for muscle spasm/ pelvic pain. 30 tablet 1   estradiol (ESTRACE) 0.1 MG/GM vaginal cream PLACE 1/2 GRAM VAGINALLY EVERY NIGHT AT BEDTIME FOR 2 WEEKS THEN USE 2 TIMES PER WEEK THEREAFTER 42.5 g 11   guaifenesin (HUMIBID E) 400 MG TABS tablet Take 400 mg by mouth daily.     hydrOXYzine (ATARAX) 25 MG tablet Take 1 tablet (25 mg total) by mouth every 8 (eight) hours as needed for anxiety (or bladder pain). 90 tablet 11   hyoscyamine (LEVSIN SL) 0.125 MG SL tablet DISSOLVE 1 TABLET UNDER THE TONGUE EVERY 4 HOURS IF NEEDED FOR NAUSEA, BLOATING, CRAMPING, OR DIARRHEA 120 tablet 3   ipratropium-albuterol (DUONEB) 0.5-2.5 (3) MG/3ML SOLN USE 1 VIAL IN NEBULIZER EVERY 4 HOURS AS NEEDED **MAX 6 DOSES A DAY** 540 mL 0   levothyroxine  (SYNTHROID) 100 MCG tablet Take  1 tablet  Daily  on an empty stomach with only water for 30 minutes & no Antacid meds, Calcium or Magnesium for 4 hours & avoid Biotin 90 tablet 3   lidocaine-prilocaine (EMLA) cream Apply 1 Application topically as needed. 30 g 0   ondansetron (ZOFRAN) 8 MG tablet TAKE 1 TABLET BY MOUTH 3 TIMES A DAY AS NEEDED FOR NAUSEA (Patient taking differently: Take 8 mg by mouth every 8 (eight) hours as needed. TAKE 1 TABLET BY MOUTH 3 TIMES A DAY AS NEEDED FOR NAUSEA) 60 tablet 2   pantoprazole (PROTONIX) 20 MG tablet TAKE 1 TABLET BY MOUTH DAILY 90 tablet 2   rosuvastatin (CRESTOR) 5 MG tablet Take  1 tablet 3 x /week for Cholesterol                                                         /                    TAKE                                     BY                                  MOUTH 39 tablet 3   sulfamethoxazole-trimethoprim (BACTRIM DS) 800-160 MG tablet Take 1 tablet by mouth 2 (two) times daily for 3 days. 6 tablet 0   No current facility-administered medications on file prior to visit.    Allergies  Allergen Reactions   Ace Inhibitors Other (See Comments)    Acute renal failure (lisinopril)   Cephalexin Anaphylaxis   Dilaudid [Hydromorphone Hcl] Anaphylaxis   Robaxin [Methocarbamol] Anaphylaxis   Macrobid [Nitrofurantoin] Other (See Comments)    Causes hyperthermia    Levaquin [Levofloxacin] Nausea And Vomiting   Neurontin [Gabapentin]     Confusion    Ciprofloxacin Rash   Codeine Nausea Only   Latex Rash   Sulfa Antibiotics Other (See Comments)    Do not take regularly per nephrology.  Can use for acute issues.     Current Problems (verified) Patient Active Problem List   Diagnosis Date Noted   Coronary atherosclerosis of native coronary artery (per CTA 01/31/2021)  06/14/2021   Interstitial cystitis 06/14/2020   Overweight (BMI 25.0-29.9)  06/13/2020   Former smoker (40 pack year, quit 2004) 08/30/2019   Atherosclerosis of aorta (HCC) by  Chest CT scan in 2020  03/22/2019   Emphysema lung (HCC) - per CT 03/09/2019    Chronic diastolic CHF (congestive heart failure) (HCC) 03/11/2019   Osteopenia 01/16/2019   History of colon polyps 06/28/2018   CKD Stage 3 (HCC) 01/10/2015   IBS  01/10/2015   Hyperlipidemia, mixed 06/12/2014   Medication management 06/12/2014   LBBB (left bundle branch block) 07/24/2013   HTN (hypertension) 07/24/2013   Major depression, recurrent, full remission (HCC)    OCD (obsessive compulsive disorder)    Abnormal glucose    Migraines    Vitamin D deficiency    Hypothyroidism 04/05/2013    Screening Tests Immunization History  Administered Date(s) Administered   Influenza Split 01/31/2016, 05/12/2018, 03/17/2019   Influenza, High Dose Seasonal PF 03/22/2017, 02/16/2018, 03/22/2019, 03/24/2021, 02/17/2022   Influenza-Unspecified 01/31/2016, 02/09/2020   PFIZER Comirnaty(Brandis Top)Covid-19 Tri-Sucrose Vaccine 03/31/2021   PFIZER(Purple Top)SARS-COV-2 Vaccination 07/05/2019, 07/26/2019, 03/04/2020   Pneumococcal Conjugate-13 03/22/2017, 06/29/2018   Pneumococcal Polysaccharide-23 02/12/1999, 06/29/2018   Td 04/27/2013   Zoster Recombinant(Shingrix) 06/14/2020, 08/28/2020   Zoster, Live 11/24/2005   Health Maintenance  Topic Date Due   INFLUENZA VACCINE  11/26/2022   COVID-19 Vaccine (5 - 2023-24 season) 12/27/2022   DTaP/Tdap/Td (2 - Tdap) 04/28/2023   Medicare Annual Wellness (AWV)  09/15/2023   MAMMOGRAM  11/19/2023   DEXA SCAN  11/19/2023   Colonoscopy  11/14/2025   Pneumonia Vaccine 83+ Years old  Completed   Hepatitis C Screening  Completed   Zoster Vaccines- Shingrix  Completed   HPV VACCINES  Aged Out   Patient Care Team: Lucky Cowboy, MD as PCP - General (Internal Medicine) Terrial Rhodes, MD as Consulting Physician (Nephrology) Manning Charity, OD as Referring Physician (Optometry) [name] Dare, MD as Consulting Physician (Gastroenterology) Wendall Stade, MD as  Consulting Physician (Cardiology) Lajoyce Corners, [facility] (Inactive) as Pharmacist (Pharmacist) Vernon Prey, Candace (Gynecology)  SURGICAL HISTORY She  has a past surgical history that includes Tonsillectomy; Bunionectomy (Bilateral, 1997); Hemorrhoid surgery (1976); Breast enhancement surgery (Bilateral); ORIF ankle fracture (Left, 08/30/2017); Breast implant removal (Bilateral, 03/07/2019); Esophagogastroduodenoscopy (egd) with propofol (N/A, 03/12/2019); Colonoscopy (10/2020); Cataract extraction w/ intraocular lens implant (Right, 2005); Inguinal hernia repair (N/A, 04/10/2021); Anterior and posterior repair with sacrospinous fixation (N/A, 11/10/2021); Bladder suspension (N/A, 11/10/2021); and Cystoscopy with fulgeration (11/10/2021). FAMILY HISTORY Her family history includes Colon polyps in her mother; Heart block in her maternal grandmother; Hypertension in her sister; Muscular dystrophy in her maternal grandmother and mother. SOCIAL HISTORY She  reports that she quit smoking about 20 years ago. Her smoking use included cigarettes. She started smoking about 60 years ago. She has a 40 pack-year smoking history. She has never used smokeless tobacco. She reports current alcohol use. She reports that she does not use drugs.   Review of Systems  Constitutional:  Negative for malaise/fatigue and weight loss.  HENT:  Negative for hearing loss and tinnitus.   Eyes:  Negative for blurred vision and double vision.  Respiratory:  Negative for cough, shortness of breath and wheezing.   Cardiovascular:  Negative for chest pain, palpitations, orthopnea, claudication and leg swelling.  Gastrointestinal:  Positive for diarrhea (improved, intermittent with trigger foods (lactose)). Negative for abdominal pain, blood in stool, constipation, heartburn, melena, nausea and vomiting.  Genitourinary: Negative.   Musculoskeletal:  Negative for falls, joint pain and myalgias.  Skin:  Negative for rash.  Neurological:   Negative for dizziness, tingling, sensory change, weakness and headaches.  Endo/Heme/Allergies:  Negative for polydipsia.  Psychiatric/Behavioral:  Negative for depression, memory loss, substance abuse and suicidal ideas. The patient is not nervous/anxious and does not have insomnia.   All other systems reviewed and are negative.    Objective:     Today's Vitals   03/18/23 1025  BP: 100/60  Pulse: 73  Temp: 97.9 F (36.6 C)  SpO2: 99%  Weight: 175 lb 3.2 oz (79.5 kg)  Height: 5\' 3"  (1.6 m)   Body mass index is 31.04 kg/m.  General appearance: alert, no distress, WD/WN, female HEENT: normocephalic, sclerae anicteric, TMs pearly, nares patent, no discharge or erythema, pharynx normal Oral cavity: MMM, no lesions Neck: supple, no lymphadenopathy, no thyromegaly, no masses Heart: RRR, normal S1, S2, no murmurs Lungs: CTA bilaterally, no wheezes, rhonchi, or rales Abdomen: +bs, soft, non tender, non distended, no masses, no hepatomegaly, no splenomegaly, no hernias. Well healed lap incision sites.  Musculoskeletal: nontender, no swelling, no obvious deformity Extremities: no edema, no cyanosis, no clubbing.  Pulses: 2+ symmetric, upper and lower extremities, normal cap refill Neurological: alert, oriented x 3, CN2-12 intact, strength normal upper extremities and lower extremities, sensation normal throughout, DTRs 2+ throughout, no cerebellar signs, gait normal Psychiatric: normal affect, behavior normal, pleasant   Pamelia Botto, NP   03/18/2023

## 2023-03-18 NOTE — Progress Notes (Signed)
Patient stated she cannot take Bactrim, she has taken it before and reacted with circular rashes all over.

## 2023-03-19 ENCOUNTER — Encounter: Payer: Self-pay | Admitting: Nurse Practitioner

## 2023-03-19 ENCOUNTER — Other Ambulatory Visit: Payer: Self-pay | Admitting: Nurse Practitioner

## 2023-03-19 DIAGNOSIS — E039 Hypothyroidism, unspecified: Secondary | ICD-10-CM

## 2023-03-19 DIAGNOSIS — D649 Anemia, unspecified: Secondary | ICD-10-CM

## 2023-03-19 DIAGNOSIS — R7989 Other specified abnormal findings of blood chemistry: Secondary | ICD-10-CM

## 2023-03-20 LAB — IRON,TIBC AND FERRITIN PANEL
%SAT: 8 % — ABNORMAL LOW (ref 16–45)
Ferritin: 7 ng/mL — ABNORMAL LOW (ref 16–288)
Iron: 36 ug/dL — ABNORMAL LOW (ref 45–160)
TIBC: 456 ug/dL — ABNORMAL HIGH (ref 250–450)

## 2023-03-20 LAB — TEST AUTHORIZATION

## 2023-03-20 LAB — COMPLETE METABOLIC PANEL WITH GFR
AG Ratio: 1.5 (calc) (ref 1.0–2.5)
ALT: 12 U/L (ref 6–29)
AST: 17 U/L (ref 10–35)
Albumin: 3.8 g/dL (ref 3.6–5.1)
Alkaline phosphatase (APISO): 69 U/L (ref 37–153)
BUN/Creatinine Ratio: 19 (calc) (ref 6–22)
BUN: 33 mg/dL — ABNORMAL HIGH (ref 7–25)
CO2: 28 mmol/L (ref 20–32)
Calcium: 9.7 mg/dL (ref 8.6–10.4)
Chloride: 103 mmol/L (ref 98–110)
Creat: 1.72 mg/dL — ABNORMAL HIGH (ref 0.60–1.00)
Globulin: 2.6 g/dL (ref 1.9–3.7)
Glucose, Bld: 91 mg/dL (ref 65–99)
Potassium: 5.1 mmol/L (ref 3.5–5.3)
Sodium: 138 mmol/L (ref 135–146)
Total Bilirubin: 0.3 mg/dL (ref 0.2–1.2)
Total Protein: 6.4 g/dL (ref 6.1–8.1)
eGFR: 31 mL/min/{1.73_m2} — ABNORMAL LOW (ref >=60)

## 2023-03-20 LAB — CBC WITH DIFFERENTIAL/PLATELET
Absolute Lymphocytes: 1469 {cells}/uL (ref 850–3900)
Absolute Monocytes: 670 {cells}/uL (ref 200–950)
Basophils Absolute: 43 {cells}/uL (ref 0–200)
Basophils Relative: 0.6 %
Eosinophils Absolute: 598 {cells}/uL — ABNORMAL HIGH (ref 15–500)
Eosinophils Relative: 8.3 %
HCT: 35 % (ref 35.0–45.0)
Hemoglobin: 10.9 g/dL — ABNORMAL LOW (ref 11.7–15.5)
MCH: 28.6 pg (ref 27.0–33.0)
MCHC: 31.1 g/dL — ABNORMAL LOW (ref 32.0–36.0)
MCV: 91.9 fL (ref 80.0–100.0)
MPV: 9.8 fL (ref 7.5–12.5)
Monocytes Relative: 9.3 %
Neutro Abs: 4421 {cells}/uL (ref 1500–7800)
Neutrophils Relative %: 61.4 %
Platelets: 380 10*3/uL (ref 140–400)
RBC: 3.81 10*6/uL (ref 3.80–5.10)
RDW: 13 % (ref 11.0–15.0)
Total Lymphocyte: 20.4 %
WBC: 7.2 10*3/uL (ref 3.8–10.8)

## 2023-03-20 LAB — LIPID PANEL
Cholesterol: 136 mg/dL (ref <200)
HDL: 66 mg/dL (ref >=50)
LDL Cholesterol (Calc): 54 mg/dL
Non-HDL Cholesterol (Calc): 70 mg/dL (ref <130)
Total CHOL/HDL Ratio: 2.1 (calc) (ref <5.0)
Triglycerides: 77 mg/dL (ref <150)

## 2023-03-20 LAB — TSH: TSH: 37.96 m[IU]/L — ABNORMAL HIGH (ref 0.40–4.50)

## 2023-03-20 LAB — HEMOGLOBIN A1C
Hgb A1c MFr Bld: 5.7 %{Hb} — ABNORMAL HIGH (ref <5.7)
Mean Plasma Glucose: 117 mg/dL
eAG (mmol/L): 6.5 mmol/L

## 2023-03-23 NOTE — Addendum Note (Signed)
Addended by: Adela Glimpse on: 03/23/2023 01:45 PM   Modules accepted: Orders

## 2023-03-24 ENCOUNTER — Other Ambulatory Visit: Payer: Self-pay | Admitting: Nurse Practitioner

## 2023-03-24 ENCOUNTER — Encounter: Payer: Self-pay | Admitting: Nurse Practitioner

## 2023-03-24 DIAGNOSIS — D508 Other iron deficiency anemias: Secondary | ICD-10-CM

## 2023-03-24 MED ORDER — FERROUS SULFATE 325 (65 FE) MG PO TBEC
325.0000 mg | DELAYED_RELEASE_TABLET | Freq: Every day | ORAL | 3 refills | Status: DC
Start: 2023-03-24 — End: 2023-05-05

## 2023-03-24 NOTE — Progress Notes (Signed)
Started patient on iron supplement due to low iron levels.

## 2023-03-31 ENCOUNTER — Ambulatory Visit: Payer: PPO | Attending: Obstetrics and Gynecology | Admitting: Physical Therapy

## 2023-03-31 ENCOUNTER — Encounter: Payer: Self-pay | Admitting: Physical Therapy

## 2023-03-31 DIAGNOSIS — R279 Unspecified lack of coordination: Secondary | ICD-10-CM | POA: Diagnosis not present

## 2023-03-31 DIAGNOSIS — M6281 Muscle weakness (generalized): Secondary | ICD-10-CM | POA: Diagnosis not present

## 2023-03-31 DIAGNOSIS — R102 Pelvic and perineal pain: Secondary | ICD-10-CM | POA: Diagnosis not present

## 2023-03-31 NOTE — Therapy (Signed)
OUTPATIENT PHYSICAL THERAPY FEMALE PELVIC TREATMENT   Patient Name: [name] MRN: [id number] DOB:[birth year], 72 y.o., female Today's Date: 03/31/2023  END OF SESSION:  PT End of Session - 03/31/23 1445     Visit Number 5    Date for PT Re-Evaluation 04/16/23    Authorization Type [employer]    Authorization - Visit Number 5    Authorization - Number of Visits 20    PT Start Time 1445    PT Stop Time 1525    PT Time Calculation (min) 40 min    Activity Tolerance Patient tolerated treatment well    Behavior During Therapy [facility] for tasks assessed/performed             Past Medical History:  Diagnosis Date   Chronic diastolic CHF (congestive heart failure) (HCC)    cardriologist--- dr Lacretia Nicks. Flora Lipps;   dx in setting sepsis 11/ 2020;   echo in epic 03-10-2019 ef 55-60%, G1DD, mild to moderate TR no stenosis and normal RASP;  CTA 01-31-2021 epic   Chronic kidney disease (CKD), stage III (moderate) (HCC)    nephrologist--- dr Isa Rankin   Depression    Diverticulosis of colon    Emphysema of lung (HCC)    no home oxygen   GERD (gastroesophageal reflux disease)    History of acute pyelonephritis    w/ hx sepsis   History of Helicobacter pylori infection 2012   History of recurrent UTIs    History of TIA (transient ischemic attack)    TIAs   Hyperlipidemia, mixed    Hypertension    followed by pcp and nephrologist----  (nuclear stress test in epic 06-28-2015 low risk no ischemia, nuclear ef 60%) no meds   Hypothyroidism    IBS (irritable bowel syndrome)    IC (interstitial cystitis)    remote hx   Inguinal hernia, bilateral    Irritable bowel syndrome with constipation and diarrhea    LBBB (left bundle branch block) 2015   cardiologist --- dr Flora Lipps;  known chronic LBBB for several yrs   OCD (obsessive compulsive disorder)    OSA (obstructive sleep apnea)    no CPAP intolerant   Osteopenia    Prolapsed hemorrhoids    Seasonal allergies    Vitamin D deficiency     Past Surgical History:  Procedure Laterality Date   ANTERIOR AND POSTERIOR REPAIR WITH SACROSPINOUS FIXATION N/A 11/10/2021   Procedure: POSTERIOR REPAIR WITH SACROSPINOUS FIXATION;  Surgeon: Marguerita Beards, MD;  Location: [facility] [place];  Service: Gynecology;  Laterality: N/A;  total time requested is 1.5 hrs   BLADDER SUSPENSION N/A 11/10/2021   Procedure: TRANSVAGINAL TAPE (TVT) PROCEDURE;  Surgeon: Marguerita Beards, MD;  Location: [facility];  Service: Gynecology;  Laterality: N/A;   BREAST ENHANCEMENT SURGERY Bilateral    1970s   BREAST IMPLANT REMOVAL Bilateral 03/07/2019   BUNIONECTOMY Bilateral 1997   CATARACT EXTRACTION W/ INTRAOCULAR LENS IMPLANT Right 2005   COLONOSCOPY  10/2020   CYSTOSCOPY WITH FULGERATION  11/10/2021   Procedure: CYSTOSCOPY WITH FULGERATION;  Surgeon: Marguerita Beards, MD;  Location: [facility];  Service: Gynecology;;   ESOPHAGOGASTRODUODENOSCOPY (EGD) WITH PROPOFOL N/A 03/12/2019   Procedure: ESOPHAGOGASTRODUODENOSCOPY (EGD) WITH PROPOFOL;  Surgeon: Jeani Hawking, MD;  Location: [facility] ENDOSCOPY;  Service: Endoscopy;  Laterality: N/A;   HEMORRHOID SURGERY  1976   INGUINAL HERNIA REPAIR N/A 04/10/2021   Procedure: LAPAROSCOPIC LEFT INGUINAL HERNIA REPAIR AND RIGHT INGUINAL HERNII REPAIR WITH TAP BLOCK;  Surgeon: Karie Soda, MD;  Location: [facility];  Service: General;  Laterality: N/A;   ORIF ANKLE FRACTURE Left 08/30/2017   Procedure: OPEN REDUCTION INTERNAL FIXATION (ORIF) ANKLE FRACTURE;  Surgeon: Samson Frederic, MD;  Location: [facility] OR;  Service: Orthopedics;  Laterality: Left;   TONSILLECTOMY     CHILD   Patient Active Problem List   Diagnosis Date Noted   Coronary atherosclerosis of native coronary artery (per CTA 01/31/2021)  06/14/2021   Interstitial cystitis 06/14/2020   Overweight (BMI 25.0-29.9) 06/13/2020   Former smoker (40 pack year, quit 2004) 08/30/2019    Atherosclerosis of aorta (HCC) by Chest CT scan in 2020  03/22/2019   Emphysema lung (HCC) - per CT 03/09/2019    Chronic diastolic CHF (congestive heart failure) (HCC) 03/11/2019   Osteopenia 01/16/2019   History of colon polyps 06/28/2018   CKD Stage 3 (HCC) 01/10/2015   IBS  01/10/2015   Hyperlipidemia, mixed 06/12/2014   Medication management 06/12/2014   LBBB (left bundle branch block) 07/24/2013   HTN (hypertension) 07/24/2013   Major depression, recurrent, full remission (HCC)    OCD (obsessive compulsive disorder)    Abnormal glucose    Migraines    Vitamin D deficiency    Hypothyroidism 04/05/2013    PCP: Langley Adie, MD  REFERRING PROVIDER: [phone] (ICD-10-CM) - Bladder pain   REFERRING DIAG: Marguerita Beards, MD   THERAPY DIAG:  Muscle weakness (generalized)  Unspecified lack of coordination  Pelvic pain  Rationale for Evaluation and Treatment: Rehabilitation  ONSET DATE: 08/13/22  SUBJECTIVE:                                                                                                                                                                                           SUBJECTIVE STATEMENT: I have not had a full urine leakage and just a dribble. I have not had to wear a thick pad.     Fluid intake: Yes: water, coffee in morning    PAIN:  Are you having pain? Yes NPRS scale: 6/10  on 03/31/23 Pain location: Anal and Vaginal  Pain type: aching, burning, and sharp Pain description: intermittent   Aggravating factors: food she eats, straining Relieving factors: relax, medicine  PRECAUTIONS: None  RED FLAGS: None   WEIGHT BEARING RESTRICTIONS: No  FALLS:  Has patient fallen in last 6 months? No  LIVING ENVIRONMENT: Lives with: lives alone  OCCUPATION: retired  PLOF: Independent  PATIENT GOALS: manage rectal prolapse and pain, not strain to urinate  PERTINENT HISTORY:  Stage III kidney disease; Hemorrhoid surgery 1976;  Inguinal hernia repair; IBS; IC; OCD; Osteopenia;  anterior and posterior repair with sacrospinous fixation 2023; bladder suspension 2023 Sexual abuse: No  BOWEL MOVEMENT: Pain with bowel movement: Yes, 6/10 sometimes pain is in vaginal area at the same time Type of bowel movement:Type (Bristol Stool Scale) type 3, Frequency daily, Strain Yes, and Splinting none Fully empty rectum: No Leakage: No Fiber supplement: Yes: Beneful  URINATION: Pain with urination: Yes, 8/10 Fully empty bladder: Yes:   strains to urinate Stream: Weak Urgency: Yes: but when she urinates the amount is small Frequency: night time every hour so she does not sleep; every 2-3 hours Leakage: Urge to void, Walking to the bathroom, Coughing, Sneezing, Laughing, and Exercise Pads: Yes: day 5, night 1 pad  INTERCOURSE:not active  PREGNANCY: Vaginal deliveries 3  PROLAPSE: Rectal prolapse   OBJECTIVE:  Note: Objective measures were completed at Evaluation unless otherwise noted.  DIAGNOSTIC FINDINGS:  none  COGNITION: Overall cognitive status: Within functional limits for tasks assessed     SENSATION: Light touch: Appears intact Proprioception: Appears intact    POSTURE: rounded shoulders and forward head  PELVIC ALIGNMENT:  LUMBARAROM/PROM: lumbar ROM is full   LOWER EXTREMITY ROM: Bilateral hip ROM is full   LOWER EXTREMITY MMT: bilateral hip strength is 4/5  PALPATION:   General  firmness in abdomen, able to contract the abdomin correctly, press suprapubically makes her feel like she needs to urinate, decreased mobility of lower rib cage.                 External Perineal Exam gapping anus                             Internal Pelvic Floor tenderness in the rectal canal, tenderness in bilateral levator ani, obturator internist, along the bladder and urethra  Patient confirms identification and approves PT to assess internal pelvic floor and treatment Yes  PELVIC MMT:   MMT eval  03/12/23  Vaginal 1/5 2/5  Internal Anal Sphincter 2/5 with small lift   External Anal Sphincter 2/5 with small lift   Puborectalis 2/5   (Blank rows = not tested)        TONE: Increased on the right  PROLAPSE: Rectal prolapse  TODAY'S TREATMENT:    03/31/23 Manual: Soft tissue mobilization: Manual work to the abdomen and diaphragm to improve tissue mobility Circular massage to the abdomen to improve peristalic motion of the intestines.  Fascial release of the lower abdomen to improve tissue mobility around the bladder Exercises: Strengthening: Nustep for 8 minutes at level 5 while assessing patient supine ball squeeze with pelvic floor contraction hold 5 sec 15 X Supine clamshell with red band and pelvic floor contraction 15 x each leg Laying on side hip internal rotation 15 x on each side Standing alternate shoulder and hip lifting against floor.   03/12/23 Manual: Internal pelvic floor techniques: No emotional/communication barriers or cognitive limitation. Patient is motivated to learn. Patient understands and agrees with treatment goals and plan. PT explains patient will be examined in standing, sitting, and lying down to see how their muscles and joints work. When they are ready, they will be asked to remove their underwear so PT can examine their perineum. The patient is also given the option of providing their own chaperone as one is not provided in our facility. The patient also has the right and is explained the right to defer or refuse any part of the evaluation or treatment including the internal exam.  With the patient's consent, PT will use one gloved finger to gently assess the muscles of the pelvic floor, seeing how well it contracts and relaxes and if there is muscle symmetry. After, the patient will get dressed and PT and patient will discuss exam findings and plan of care. PT and patient discuss plan of care, schedule, attendance policy and HEP activities.  Going  through the vaginal canal working on the levator ani, obturator internist. Fascial release on the sides of the bladder and along the urethra with one finger internally and other suprapubic area going slowly and monitoring for pain.     03/05/23 Exercises: Strengthening: Education on urge to void to deter the need to urinate to increase the amount of time between urination.  Supine ball squeeze with pelvic floor contraction hold 5 sec 15 X Supine bridge with ball squeeze and pelvic floor contraction Supine clamshell with red band and pelvic floor contraction     PATIENT EDUCATION:  03/05/23 Education details: Access Code: [email or web address], educated patient to lay on her back when she feels her rectal prolapse to reset and reduce her pain, urge to void Person educated: Patient Education method: Explanation, Demonstration, Tactile cues, Verbal cues, and Handouts Education comprehension: verbalized understanding, returned demonstration, verbal cues required, tactile cues required, and needs further education  HOME EXERCISE PROGRAM: 03/05/23 Access Code: [email or web address] URL: [email or web address] Date: 03/05/2023 Prepared by: Eulis Foster  Program Notes sit on ball and massage the pelvic floor.   Exercises - Supine Pelvic Floor Contraction  - 3 x daily - 7 x weekly - 1 sets - 5 reps - 10 sec hold - Supine Pelvic Floor Stretch  - 1 x daily - 7 x weekly - 1 sets - 1 reps - 30 sec hold - Supine Figure 4 Piriformis Stretch with Leg Extension  - 1 x daily - 7 x weekly - 1 sets - 1 reps - 30 sec hold - Supine Hip Adduction Isometric with Ball  - 1 x daily - 7 x weekly - 1 sets - 10 reps - Supine Bridge with Mini Swiss Ball Between Knees  - 1 x daily - 7 x weekly - 1 sets - 10 reps - Hooklying Clamshell with Resistance  - 1 x daily - 7 x weekly - 1 sets - 10 reps Access Code: [email or web address] URL: [email or web address] Date: 02/26/2023 Prepared by: Eulis Foster  Program Notes sit on  ball and massage the pelvic floor.   Exercises - Supine Pelvic Floor Contraction  - 3 x daily - 7 x weekly - 1 sets - 5 reps - 10 sec hold - Supine Pelvic Floor Stretch  - 1 x daily - 7 x weekly - 1 sets - 1 reps - 30 sec hold - Supine Figure 4 Piriformis Stretch with Leg Extension  - 1 x daily - 7 x weekly - 1 sets - 1 reps - 30 sec hold  ASSESSMENT:  CLINICAL IMPRESSION: Patient is a 72 y.o. female who was seen today for physical therapy  treatment for bladder pain. She strains 10% less with urination. She is able to hold her urine to get to the bathroom this week. Patient has had dribbling of urine compared to full bladder leakage.  Patient as not had the stool incontinence in a long time. Patient needs to keep her stool loose to be able to get around her rectal prolapse.  Patient reports she is falling out of her bed when sleeping and suggested rail to put on bed to  prevent.Patient is doing her exercises at home. She had increased bladder pain after exercise.  Patient will benefit from skilled therapy to work on pelvic floor strength and reduction of pain to improve urination and bowel movements.   OBJECTIVE IMPAIRMENTS: decreased coordination, decreased strength, increased fascial restrictions, increased muscle spasms, and pain.   ACTIVITY LIMITATIONS: sitting, standing, continence, toileting, and locomotion level  PARTICIPATION LIMITATIONS: meal prep, cleaning, laundry, shopping, and community activity  PERSONAL FACTORS: Age, Fitness, Time since onset of injury/illness/exacerbation, and 1-2 comorbidities: Stage III kidney disease; Hemorrhoid surgery 1976; Inguinal hernia repair; IBS; IC; OCD; Osteopenia; aterior and posterior repair with sacrospinous fixation 2023; bladder suspension 2023  are also affecting patient's functional outcome.   REHAB POTENTIAL: Good  CLINICAL DECISION MAKING: Evolving/moderate complexity  EVALUATION COMPLEXITY: Moderate   GOALS: Goals reviewed with  patient? Yes  SHORT TERM GOALS: Target date: 03/18/23  Patient independent with initial HEP for pelvic floor and core strength.  Baseline: Goal status: Met 03/05/23  2.  Patient is independent with flexibility exercises to relax the pelvic floor muscles.  Baseline:  Goal status: Met 03/12/23  3.  Patient is educated on ways to manage her rectal prolapse.  Baseline:  Goal status: INITIAL  4.  Patient is educated on ways to manage her pelvic pain.  Baseline:  Goal status: Met 03/12/23   LONG TERM GOALS: Target date: 04/16/23  Patient is independent with advanced HEP for core and pelvic floor to reduce leakage.  Baseline:  Goal status: INITIAL  2.  Patient is able to have a bowel movement or urinate with pain level </= 1-2/10 due to the ability to relax her muscles and use breath.  Baseline:  Goal status: INITIAL  3.  Patient is able to urinate </= 2 times per night due to reduction of the urge to void.  Baseline:  Goal status: INITIAL  4.  Patient reports her urinary leakage decreased >/= 75% due to improved pelvic floor strength >/= 3/5 and reduction of trigger points.  Baseline: dribble instead of full bladder release Goal status: INITIAL  5.  Patient is able to urinate with straining </= 75% due to improve relaxation of pelvic floor.  Baseline: 10% better Goal status: INITIAL   PLAN:  PT FREQUENCY: 1x/week  PT DURATION: 8 weeks  PLANNED INTERVENTIONS: 97110-Therapeutic exercises, 97530- Therapeutic activity, 97112- Neuromuscular re-education, 97140- Manual therapy, 97014- Electrical stimulation (unattended), 97035- Ultrasound, Patient/Family education, Dry Needling, Cryotherapy, Moist heat, and Biofeedback  PLAN FOR NEXT SESSION: manual work to abdomen on left to work on diaphragmatic breathing,  manual work to pelvic floor around bladder and urethra and levator ani Ask about pain with urination and bowel movements  Sung Gavaghan, PT 03/31/23 3:29 PM

## 2023-04-02 ENCOUNTER — Ambulatory Visit (HOSPITAL_COMMUNITY)
Admission: EM | Admit: 2023-04-02 | Discharge: 2023-04-02 | Disposition: A | Discharge status: Home / Self Care | Payer: PPO

## 2023-04-02 ENCOUNTER — Encounter (HOSPITAL_COMMUNITY): Payer: Self-pay

## 2023-04-02 ENCOUNTER — Telehealth (HOSPITAL_COMMUNITY): Payer: Self-pay | Admitting: Student

## 2023-04-02 ENCOUNTER — Emergency Department (HOSPITAL_COMMUNITY)
Admission: EM | Admit: 2023-04-02 | Discharge: 2023-04-02 | Disposition: A | Discharge status: Home / Self Care | Payer: PPO | Attending: Student | Admitting: Student

## 2023-04-02 ENCOUNTER — Other Ambulatory Visit: Payer: Self-pay

## 2023-04-02 ENCOUNTER — Encounter (HOSPITAL_COMMUNITY): Payer: Self-pay | Admitting: Emergency Medicine

## 2023-04-02 DIAGNOSIS — Z7982 Long term (current) use of aspirin: Secondary | ICD-10-CM | POA: Insufficient documentation

## 2023-04-02 DIAGNOSIS — Z91040 Latex allergy status: Secondary | ICD-10-CM | POA: Diagnosis not present

## 2023-04-02 DIAGNOSIS — I130 Hypertensive heart and chronic kidney disease with heart failure and stage 1 through stage 4 chronic kidney disease, or unspecified chronic kidney disease: Secondary | ICD-10-CM | POA: Insufficient documentation

## 2023-04-02 DIAGNOSIS — K6289 Other specified diseases of anus and rectum: Secondary | ICD-10-CM | POA: Diagnosis not present

## 2023-04-02 DIAGNOSIS — I5032 Chronic diastolic (congestive) heart failure: Secondary | ICD-10-CM | POA: Insufficient documentation

## 2023-04-02 DIAGNOSIS — E039 Hypothyroidism, unspecified: Secondary | ICD-10-CM | POA: Diagnosis not present

## 2023-04-02 DIAGNOSIS — N183 Chronic kidney disease, stage 3 unspecified: Secondary | ICD-10-CM | POA: Insufficient documentation

## 2023-04-02 DIAGNOSIS — K625 Hemorrhage of anus and rectum: Secondary | ICD-10-CM

## 2023-04-02 DIAGNOSIS — Z87891 Personal history of nicotine dependence: Secondary | ICD-10-CM | POA: Insufficient documentation

## 2023-04-02 LAB — CBC WITH DIFFERENTIAL/PLATELET
Abs Immature Granulocytes: 0.03 10*3/uL (ref 0.00–0.07)
Basophils Absolute: 0 10*3/uL (ref 0.0–0.1)
Basophils Relative: 0 %
Eosinophils Absolute: 0.2 10*3/uL (ref 0.0–0.5)
Eosinophils Relative: 2 %
HCT: 34.6 % — ABNORMAL LOW (ref 36.0–46.0)
Hemoglobin: 10.7 g/dL — ABNORMAL LOW (ref 12.0–15.0)
Immature Granulocytes: 0 %
Lymphocytes Relative: 15 %
Lymphs Abs: 1.4 10*3/uL (ref 0.7–4.0)
MCH: 28.9 pg (ref 26.0–34.0)
MCHC: 30.9 g/dL (ref 30.0–36.0)
MCV: 93.5 fL (ref 80.0–100.0)
Monocytes Absolute: 0.6 10*3/uL (ref 0.1–1.0)
Monocytes Relative: 7 %
Neutro Abs: 7.1 10*3/uL (ref 1.7–7.7)
Neutrophils Relative %: 76 %
Platelets: 360 10*3/uL (ref 150–400)
RBC: 3.7 MIL/uL — ABNORMAL LOW (ref 3.87–5.11)
RDW: 14.3 % (ref 11.5–15.5)
WBC: 9.4 10*3/uL (ref 4.0–10.5)
nRBC: 0 % (ref 0.0–0.2)

## 2023-04-02 LAB — COMPREHENSIVE METABOLIC PANEL
ALT: 17 U/L (ref 0–44)
AST: 24 U/L (ref 15–41)
Albumin: 3.2 g/dL — ABNORMAL LOW (ref 3.5–5.0)
Alkaline Phosphatase: 57 U/L (ref 38–126)
Anion gap: 7 (ref 5–15)
BUN: 33 mg/dL — ABNORMAL HIGH (ref 8–23)
CO2: 25 mmol/L (ref 22–32)
Calcium: 8.7 mg/dL — ABNORMAL LOW (ref 8.9–10.3)
Chloride: 101 mmol/L (ref 98–111)
Creatinine, Ser: 1.42 mg/dL — ABNORMAL HIGH (ref 0.44–1.00)
GFR, Estimated: 40 mL/min — ABNORMAL LOW (ref >60)
Glucose, Bld: 100 mg/dL — ABNORMAL HIGH (ref 70–99)
Potassium: 4.5 mmol/L (ref 3.5–5.1)
Sodium: 133 mmol/L — ABNORMAL LOW (ref 135–145)
Total Bilirubin: 0.8 mg/dL (ref <1.2)
Total Protein: 6.3 g/dL — ABNORMAL LOW (ref 6.5–8.1)

## 2023-04-02 MED ORDER — HYDROCODONE-ACETAMINOPHEN 5-325 MG PO TABS
1.0000 | ORAL_TABLET | Freq: Once | ORAL | Status: AC
  Administered 2023-04-02: 1 via ORAL
  Filled 2023-04-02: qty 1

## 2023-04-02 MED ORDER — HYDROCODONE-ACETAMINOPHEN 5-325 MG PO TABS: 1.0000 | ORAL_TABLET | ORAL | 0 refills | Status: DC | PRN

## 2023-04-02 MED ORDER — ONDANSETRON 4 MG PO TBDP
4.0000 mg | ORAL_TABLET | Freq: Once | ORAL | Status: AC
  Administered 2023-04-02: 4 mg via ORAL
  Filled 2023-04-02: qty 1

## 2023-04-02 MED ORDER — GABAPENTIN 100 MG PO CAPS: 100.0000 mg | ORAL_CAPSULE | Freq: Three times a day (TID) | ORAL | 0 refills | Status: DC | PRN

## 2023-04-02 MED ORDER — GABAPENTIN 100 MG PO CAPS
100.0000 mg | ORAL_CAPSULE | Freq: Once | ORAL | Status: AC
  Administered 2023-04-02: 100 mg via ORAL
  Filled 2023-04-02: qty 1

## 2023-04-02 NOTE — Telephone Encounter (Signed)
Note created to send medication to pharmacy

## 2023-04-02 NOTE — ED Notes (Signed)
Patient is being discharged from the Urgent Care and sent to the Emergency Department via POV . Per [place]  Garrison, FNP, patient is in need of higher level of care due to Rectal Prolapse. Patient is aware and verbalizes understanding of plan of care.  Vitals:   04/02/23 1125  BP: 95/63  Pulse: 82  Resp: 16  Temp: (!) 97.5 F (36.4 C)  SpO2: 93%

## 2023-04-02 NOTE — ED Provider Notes (Signed)
Patient presents to clinic complaining of rectal pain.  She does have a history of rectal prolapse.  Reports normally she is able to manually replace the rectal prolapse.  Recently the prolapse just slides back out and has become very very painful.  Reports the pain is so bad that she wants to die.  She is also having bright red blood in the toilet, reports this is much more than usual.  Bowel movements are much more painful than usual as well.  She does have a hx of IDA.   Discussed due to the inability to replace her rectal prolapse that she needs to be evaluated at the emergency department with stat labs and further evaluation.  Patient verbalized understanding, will head to [facility] emergency department via POV.    [name], [place] N, [place] 04/02/23 1155

## 2023-04-02 NOTE — ED Provider Notes (Signed)
[place] [facility]ERGENCY DEPARTMENT AT [facility] Provider Note  CSN: [id number] Arrival date & time: 04/02/23 1206  Chief Complaint(s) Rectal Pain  HPI [name] is a 72 y.o. female with PMH CHF, CKD 3, peptic ulcer disease, interstitial cystitis, inguinal hernia with mesh placement, recurrent rectal prolapse who presents emergency department for evaluation of persistent rectal prolapse and rectal pain.  Patient states that she has had issues with constipation over the last few days and has been straining on the toilet.  She noticed persistent rectal prolapse and bleeding and called her surgeon who stated that she needed to be evaluated in an urgent care or emergency department.  She went to urgent care where I do not see documentation of a witnessed persistent rectal prolapse but was sent to the emergency department for further evaluation.  She currently endorses rectal pain but denies abdominal pain, nausea, vomiting, headache, fever or other systemic symptoms.   Past Medical History Past Medical History:  Diagnosis Date   Chronic diastolic CHF (congestive heart failure) (HCC)    cardriologist--- dr Lacretia Nicks. Flora Lipps;   dx in setting sepsis 11/ 2020;   echo in epic 03-10-2019 ef 55-60%, G1DD, mild to moderate TR no stenosis and normal RASP;  CTA 01-31-2021 epic   Chronic kidney disease (CKD), stage III (moderate) (HCC)    nephrologist--- dr Isa Rankin   Depression    Diverticulosis of colon    Emphysema of lung (HCC)    no home oxygen   GERD (gastroesophageal reflux disease)    History of acute pyelonephritis    w/ hx sepsis   History of Helicobacter pylori infection 2012   History of recurrent UTIs    History of TIA (transient ischemic attack)    TIAs   Hyperlipidemia, mixed    Hypertension    followed by pcp and nephrologist----  (nuclear stress test in epic 06-28-2015 low risk no ischemia, nuclear ef 60%) no meds   Hypothyroidism    IBS (irritable bowel syndrome)    IC  (interstitial cystitis)    remote hx   Inguinal hernia, bilateral    Irritable bowel syndrome with constipation and diarrhea    LBBB (left bundle branch block) 2015   cardiologist --- dr Flora Lipps;  known chronic LBBB for several yrs   OCD (obsessive compulsive disorder)    OSA (obstructive sleep apnea)    no CPAP intolerant   Osteopenia    Prolapsed hemorrhoids    Seasonal allergies    Vitamin D deficiency    Patient Active Problem List   Diagnosis Date Noted   Coronary atherosclerosis of native coronary artery (per CTA 01/31/2021)  06/14/2021   Interstitial cystitis 06/14/2020   Overweight (BMI 25.0-29.9) 06/13/2020   Former smoker (40 pack year, quit 2004) 08/30/2019   Atherosclerosis of aorta (HCC) by Chest CT scan in 2020  03/22/2019   Emphysema lung (HCC) - per CT 03/09/2019    Chronic diastolic CHF (congestive heart failure) (HCC) 03/11/2019   Osteopenia 01/16/2019   History of colon polyps 06/28/2018   CKD Stage 3 (HCC) 01/10/2015   IBS  01/10/2015   Hyperlipidemia, mixed 06/12/2014   Medication management 06/12/2014   LBBB (left bundle branch block) 07/24/2013   HTN (hypertension) 07/24/2013   Major depression, recurrent, full remission (HCC)    OCD (obsessive compulsive disorder)    Abnormal glucose    Migraines    Vitamin D deficiency    Hypothyroidism 04/05/2013   Home Medication(s) Prior to Admission medications  Medication Sig Start Date End Date Taking? Authorizing Provider  HYDROcodone-acetaminophen (NORCO/VICODIN) 5-325 MG tablet Take 1 tablet by mouth every 4 (four) hours as needed. 04/02/23  Yes Maloree Uplinger, MD  amitriptyline (ELAVIL) 50 MG tablet Take 1 tablet (50 mg total) by mouth daily. 03/08/23   Adela Glimpse, NP  aspirin EC 81 MG tablet Take 81 mg by mouth daily.     [provider]  atenolol (TENORMIN) 50 MG tablet Take 50 mg by mouth daily.    [provider]  buPROPion (WELLBUTRIN XL) 150 MG 24 hr tablet Take 3 tablets (450  mg total) by mouth daily for depression. 06/02/22   Lucky Cowboy, MD  cetirizine (ZYRTEC) 10 MG tablet Take 10 mg by mouth daily.    [provider]  Cholecalciferol (VITAMIN D-3) 5000 UNITS TABS Take 5,000 Units by mouth daily.     [provider]  Cinnamon 500 MG capsule Take 500 mg by mouth daily.    [provider]  diazepam (VALIUM) 5 MG tablet Place 1 tablet vaginally nightly as needed for muscle spasm/ pelvic pain. 03/16/23   Marguerita Beards, MD  estradiol (ESTRACE) 0.1 MG/GM vaginal cream PLACE 1/2 GRAM VAGINALLY EVERY NIGHT AT BEDTIME FOR 2 WEEKS THEN USE 2 TIMES PER WEEK THEREAFTER 12/15/22   Marguerita Beards, MD  ferrous sulfate 325 (65 FE) MG EC tablet Take 1 tablet (325 mg total) by mouth daily with breakfast. 03/24/23   Adela Glimpse, NP  gabapentin (NEURONTIN) 100 MG capsule Take 1 capsule (100 mg total) by mouth 3 (three) times daily as needed (rectal pain). 04/02/23 05/02/23  Brynleigh Sequeira, MD  guaifenesin (HUMIBID E) 400 MG TABS tablet Take 400 mg by mouth daily.    [provider]  hydrOXYzine (ATARAX) 25 MG tablet Take 1 tablet (25 mg total) by mouth every 8 (eight) hours as needed for anxiety (or bladder pain). 01/27/23   Marguerita Beards, MD  hyoscyamine (LEVSIN SL) 0.125 MG SL tablet DISSOLVE 1 TABLET UNDER THE TONGUE EVERY 4 HOURS IF NEEDED FOR NAUSEA, BLOATING, CRAMPING, OR DIARRHEA 12/11/22   Raynelle Dick, NP  ipratropium-albuterol (DUONEB) 0.5-2.5 (3) MG/3ML SOLN USE 1 VIAL IN NEBULIZER EVERY 4 HOURS AS NEEDED **MAX 6 DOSES A DAY** 11/24/19   Judd Gaudier, NP  levothyroxine (SYNTHROID) 100 MCG tablet Take  1 tablet  Daily  on an empty stomach with only water for 30 minutes & no Antacid meds, Calcium or Magnesium for 4 hours & avoid Biotin 08/11/22   Lucky Cowboy, MD  lidocaine-prilocaine (EMLA) cream Apply 1 Application topically as needed. 10/04/22   Zuleta, Joan Mayans, NP  ondansetron (ZOFRAN) 8 MG tablet TAKE 1  TABLET BY MOUTH 3 TIMES A DAY AS NEEDED FOR NAUSEA Patient taking differently: Take 8 mg by mouth every 8 (eight) hours as needed. TAKE 1 TABLET BY MOUTH 3 TIMES A DAY AS NEEDED FOR NAUSEA 10/02/20   Judd Gaudier, NP  pantoprazole (PROTONIX) 20 MG tablet TAKE 1 TABLET BY MOUTH DAILY 08/17/22   Raynelle Dick, NP  rosuvastatin (CRESTOR) 5 MG tablet Take  1 tablet 3 x /week for Cholesterol                                                         /  TAKE                                     BY                                  MOUTH 04/06/22   Lucky Cowboy, MD                                                                                                                                    Past Surgical History Past Surgical History:  Procedure Laterality Date   ANTERIOR AND POSTERIOR REPAIR WITH SACROSPINOUS FIXATION N/A 11/10/2021   Procedure: POSTERIOR REPAIR WITH SACROSPINOUS FIXATION;  Surgeon: Marguerita Beards, MD;  Location: [facility];  Service: Gynecology;  Laterality: N/A;  total time requested is 1.5 hrs   BLADDER SUSPENSION N/A 11/10/2021   Procedure: TRANSVAGINAL TAPE (TVT) PROCEDURE;  Surgeon: Marguerita Beards, MD;  Location: [facility];  Service: Gynecology;  Laterality: N/A;   BREAST ENHANCEMENT SURGERY Bilateral    1970s   BREAST IMPLANT REMOVAL Bilateral 03/07/2019   BUNIONECTOMY Bilateral 1997   CATARACT EXTRACTION W/ INTRAOCULAR LENS IMPLANT Right 2005   COLONOSCOPY  10/2020   CYSTOSCOPY WITH FULGERATION  11/10/2021   Procedure: CYSTOSCOPY WITH FULGERATION;  Surgeon: Marguerita Beards, MD;  Location: [facility];  Service: Gynecology;;   ESOPHAGOGASTRODUODENOSCOPY (EGD) WITH PROPOFOL N/A 03/12/2019   Procedure: ESOPHAGOGASTRODUODENOSCOPY (EGD) WITH PROPOFOL;  Surgeon: Jeani Hawking, MD;  Location: [facility];  Service: Endoscopy;  Laterality: N/A;   HEMORRHOID SURGERY  1976   INGUINAL HERNIA  REPAIR N/A 04/10/2021   Procedure: LAPAROSCOPIC LEFT INGUINAL HERNIA REPAIR AND RIGHT INGUINAL HERNII REPAIR WITH TAP BLOCK;  Surgeon: Karie Soda, MD;  Location: [facility];  Service: General;  Laterality: N/A;   ORIF ANKLE FRACTURE Left 08/30/2017   Procedure: OPEN REDUCTION INTERNAL FIXATION (ORIF) ANKLE FRACTURE;  Surgeon: Samson Frederic, MD;  Location: [facility];  Service: Orthopedics;  Laterality: Left;   TONSILLECTOMY     CHILD   Family History Family History  Problem Relation Age of Onset   Colon polyps Mother    Muscular dystrophy Mother        late 58s   Hypertension Sister    Muscular dystrophy Maternal Grandmother        50   Heart block Maternal Grandmother    Colon cancer Neg Hx    Esophageal cancer Neg Hx    Rectal cancer Neg Hx    Stomach cancer Neg Hx     Social History Social History   Tobacco Use   Smoking status: Former    Current packs/day: 0.00    Average packs/day: 1 pack/day for 40.0 years (40.0 ttl pk-yrs)  Types: Cigarettes    Start date: 1964    Quit date: 2004    Years since quitting: 20.9   Smokeless tobacco: Never  Vaping Use   Vaping status: Never Used  Substance Use Topics   Alcohol use: Yes    Comment: rare   Drug use: Never   Allergies Ace inhibitors, Cephalexin, Dilaudid [hydromorphone hcl], Robaxin [methocarbamol], Macrobid [nitrofurantoin], Levaquin [levofloxacin], Neurontin [gabapentin], Ciprofloxacin, Codeine, Latex, and Sulfa antibiotics  Review of Systems Review of Systems  Gastrointestinal:  Positive for blood in stool and rectal pain.    Physical Exam Vital Signs  I have reviewed the triage vital signs BP 117/80   Pulse 73   Temp 97.6 F (36.4 C) (Oral)   Resp 17   Ht 5\' 3"  (1.6 m)   Wt 79.5 kg   SpO2 98%   BMI 31.04 kg/m   Physical Exam Vitals and nursing note reviewed.  Constitutional:      General: She is not in acute distress.    Appearance: She is well-developed.  HENT:     Head:  Normocephalic and atraumatic.  Eyes:     Conjunctiva/sclera: Conjunctivae normal.  Cardiovascular:     Rate and Rhythm: Normal rate and regular rhythm.     Heart sounds: No murmur heard. Pulmonary:     Effort: Pulmonary effort is normal. No respiratory distress.     Breath sounds: Normal breath sounds.  Abdominal:     Palpations: Abdomen is soft.     Tenderness: There is no abdominal tenderness.  Genitourinary:    Rectum: Normal.  Musculoskeletal:        General: No swelling.     Cervical back: Neck supple.  Skin:    General: Skin is warm and dry.     Capillary Refill: Capillary refill takes less than 2 seconds.  Neurological:     Mental Status: She is alert.  Psychiatric:        Mood and Affect: Mood normal.     ED Results and Treatments Labs (all labs ordered are listed, but only abnormal results are displayed) Labs Reviewed  COMPREHENSIVE METABOLIC PANEL - Abnormal; Notable for the following components:      Result Value   Sodium 133 (*)    Glucose, Bld 100 (*)    BUN 33 (*)    Creatinine, Ser 1.42 (*)    Calcium 8.7 (*)    Total Protein 6.3 (*)    Albumin 3.2 (*)    GFR, Estimated 40 (*)    All other components within normal limits  CBC WITH DIFFERENTIAL/PLATELET - Abnormal; Notable for the following components:   RBC 3.70 (*)    Hemoglobin 10.7 (*)    HCT 34.6 (*)    All other components within normal limits                                                                                                                          Radiology No  results found.  Pertinent labs & imaging results that were available during my care of the patient were reviewed by me and considered in my medical decision making (see MDM for details).  Medications Ordered in ED Medications  HYDROcodone-acetaminophen (NORCO/VICODIN) 5-325 MG per tablet 1 tablet (1 tablet Oral Given 04/02/23 1244)  ondansetron (ZOFRAN-ODT) disintegrating tablet 4 mg (4 mg Oral Given 04/02/23 1244)   gabapentin (NEURONTIN) capsule 100 mg (100 mg Oral Given 04/02/23 1414)                                                                                                                                     Procedures Procedures  (including critical care time)  Medical Decision Making / ED Course   This patient presents to the ED for concern of rectal pain, this involves an extensive number of treatment options, and is a complaint that carries with it a high risk of complications and morbidity.  The differential diagnosis includes rectal prolapse, anal fissure, hemorrhoids, perianal abscess, fistula  MDM: Patient seen emergency room for evaluation of rectal pain and persistent rectal prolapse.  Exam reveals no rectal prolapse and does appear that this has self reduced.  Abdominal exam is unremarkable.  Laboratory evaluation appears improved with improving BUN and creatinine 30 at 33 and 1.42 respectively, stable hemoglobin at 10.7.  Patient pain controlled with Norco and Zofran and on reevaluation she states the pain is still persistent.  I did speak with the general surgeon on-call Dr. Azucena Cecil he states that sometimes they use gabapentin for pain control in the scenario and we did trial this with symptomatic improvement.  Dr. Azucena Cecil states she will send a message to Dr. Maurine Minister schedulers to try and speed up patient's outpatient follow-up.  At this time with symptoms improved and prolapse reduced patient does not meet inpatient criteria for admission and will be discharged with outpatient follow-up.  Return precautions given which she voiced understanding   Additional history obtained: -Additional history obtained from son -External records from outside source obtained and reviewed including: Chart review including previous notes, labs, imaging, consultation notes   Lab Tests: -I ordered, reviewed, and interpreted labs.   The pertinent results include:   Labs Reviewed  COMPREHENSIVE  METABOLIC PANEL - Abnormal; Notable for the following components:      Result Value   Sodium 133 (*)    Glucose, Bld 100 (*)    BUN 33 (*)    Creatinine, Ser 1.42 (*)    Calcium 8.7 (*)    Total Protein 6.3 (*)    Albumin 3.2 (*)    GFR, Estimated 40 (*)    All other components within normal limits  CBC WITH DIFFERENTIAL/PLATELET - Abnormal; Notable for the following components:   RBC 3.70 (*)    Hemoglobin 10.7 (*)    HCT 34.6 (*)    All other components within normal limits  Medicines ordered and prescription drug management: Meds ordered this encounter  Medications   HYDROcodone-acetaminophen (NORCO/VICODIN) 5-325 MG per tablet 1 tablet   ondansetron (ZOFRAN-ODT) disintegrating tablet 4 mg   gabapentin (NEURONTIN) capsule 100 mg   HYDROcodone-acetaminophen (NORCO/VICODIN) 5-325 MG tablet    Sig: Take 1 tablet by mouth every 4 (four) hours as needed.    Dispense:  10 tablet    Refill:  0    -I have reviewed the patients home medicines and have made adjustments as needed  Critical interventions none  Consultations Obtained: I requested consultation with the general surgeon on-call Dr. Azucena Cecil,  and discussed lab and imaging findings as well as pertinent plan - they recommend: Gabapentin outpatient follow-up   Cardiac Monitoring: The patient was maintained on a cardiac monitor.  I personally viewed and interpreted the cardiac monitored which showed an underlying rhythm of: NSR  Social Determinants of Health:  Factors impacting patients care include: none   Reevaluation: After the interventions noted above, I reevaluated the patient and found that they have :improved  Co morbidities that complicate the patient evaluation  Past Medical History:  Diagnosis Date   Chronic diastolic CHF (congestive heart failure) (HCC)    cardriologist--- dr Lacretia Nicks. Flora Lipps;   dx in setting sepsis 11/ 2020;   echo in epic 03-10-2019 ef 55-60%, G1DD, mild to moderate TR no stenosis  and normal RASP;  CTA 01-31-2021 epic   Chronic kidney disease (CKD), stage III (moderate) (HCC)    nephrologist--- dr Isa Rankin   Depression    Diverticulosis of colon    Emphysema of lung (HCC)    no home oxygen   GERD (gastroesophageal reflux disease)    History of acute pyelonephritis    w/ hx sepsis   History of Helicobacter pylori infection 2012   History of recurrent UTIs    History of TIA (transient ischemic attack)    TIAs   Hyperlipidemia, mixed    Hypertension    followed by pcp and nephrologist----  (nuclear stress test in epic 06-28-2015 low risk no ischemia, nuclear ef 60%) no meds   Hypothyroidism    IBS (irritable bowel syndrome)    IC (interstitial cystitis)    remote hx   Inguinal hernia, bilateral    Irritable bowel syndrome with constipation and diarrhea    LBBB (left bundle branch block) 2015   cardiologist --- dr Flora Lipps;  known chronic LBBB for several yrs   OCD (obsessive compulsive disorder)    OSA (obstructive sleep apnea)    no CPAP intolerant   Osteopenia    Prolapsed hemorrhoids    Seasonal allergies    Vitamin D deficiency       Dispostion: I considered admission for this patient, but at this time she does not meet inpatient criteria for admission and will be discharged with outpatient follow-up     Final Clinical Impression(s) / ED Diagnoses Final diagnoses:  Rectal pain     @PCDICTATION @    Glendora Score, MD 04/02/23 1901

## 2023-04-02 NOTE — ED Triage Notes (Addendum)
Patient sent from urgent care. Patient reports she often gets rectal prolapse, but recently she is unable to put it in and it is becoming pain full. Also reports bleeding and has been unable to have a BM.

## 2023-04-02 NOTE — ED Triage Notes (Addendum)
Pt c/o rectal pain and constipation. She was dx with rectal prolapse and interstitial cystitis in Oct. States she cannot have BM unless stool is liquid and is very painful. She has been taking stool soften as advised by GI  She has been able to push prolapse back in typically but today she cannot.   She is also concerned about swelling in leg that has been going on for a few months

## 2023-04-04 ENCOUNTER — Other Ambulatory Visit: Payer: Self-pay | Admitting: Obstetrics and Gynecology

## 2023-04-04 ENCOUNTER — Other Ambulatory Visit: Payer: Self-pay | Admitting: Nurse Practitioner

## 2023-04-04 DIAGNOSIS — K649 Unspecified hemorrhoids: Secondary | ICD-10-CM

## 2023-04-04 DIAGNOSIS — R102 Pelvic and perineal pain: Secondary | ICD-10-CM

## 2023-04-04 DIAGNOSIS — R82998 Other abnormal findings in urine: Secondary | ICD-10-CM

## 2023-04-05 ENCOUNTER — Encounter: Payer: Self-pay | Admitting: Internal Medicine

## 2023-04-05 ENCOUNTER — Other Ambulatory Visit: Payer: Self-pay

## 2023-04-05 ENCOUNTER — Emergency Department (HOSPITAL_COMMUNITY)
Admission: EM | Admit: 2023-04-05 | Discharge: 2023-04-05 | Disposition: A | Discharge status: Home / Self Care | Payer: PPO | Attending: Emergency Medicine | Admitting: Emergency Medicine

## 2023-04-05 ENCOUNTER — Encounter (HOSPITAL_COMMUNITY): Payer: Self-pay

## 2023-04-05 ENCOUNTER — Emergency Department (HOSPITAL_COMMUNITY): Payer: PPO

## 2023-04-05 ENCOUNTER — Encounter: Payer: Self-pay | Admitting: Obstetrics and Gynecology

## 2023-04-05 DIAGNOSIS — R531 Weakness: Secondary | ICD-10-CM | POA: Diagnosis not present

## 2023-04-05 DIAGNOSIS — Z91040 Latex allergy status: Secondary | ICD-10-CM | POA: Insufficient documentation

## 2023-04-05 DIAGNOSIS — K429 Umbilical hernia without obstruction or gangrene: Secondary | ICD-10-CM | POA: Diagnosis not present

## 2023-04-05 DIAGNOSIS — K6289 Other specified diseases of anus and rectum: Secondary | ICD-10-CM | POA: Diagnosis present

## 2023-04-05 DIAGNOSIS — K623 Rectal prolapse: Secondary | ICD-10-CM | POA: Insufficient documentation

## 2023-04-05 DIAGNOSIS — K449 Diaphragmatic hernia without obstruction or gangrene: Secondary | ICD-10-CM | POA: Diagnosis not present

## 2023-04-05 DIAGNOSIS — K5730 Diverticulosis of large intestine without perforation or abscess without bleeding: Secondary | ICD-10-CM | POA: Diagnosis not present

## 2023-04-05 LAB — COMPREHENSIVE METABOLIC PANEL
ALT: 16 U/L (ref 0–44)
AST: 20 U/L (ref 15–41)
Albumin: 3.2 g/dL — ABNORMAL LOW (ref 3.5–5.0)
Alkaline Phosphatase: 51 U/L (ref 38–126)
Anion gap: 9 (ref 5–15)
BUN: 31 mg/dL — ABNORMAL HIGH (ref 8–23)
CO2: 22 mmol/L (ref 22–32)
Calcium: 8.6 mg/dL — ABNORMAL LOW (ref 8.9–10.3)
Chloride: 101 mmol/L (ref 98–111)
Creatinine, Ser: 1.67 mg/dL — ABNORMAL HIGH (ref 0.44–1.00)
GFR, Estimated: 33 mL/min — ABNORMAL LOW (ref >60)
Glucose, Bld: 104 mg/dL — ABNORMAL HIGH (ref 70–99)
Potassium: 4.9 mmol/L (ref 3.5–5.1)
Sodium: 132 mmol/L — ABNORMAL LOW (ref 135–145)
Total Bilirubin: 0.5 mg/dL (ref <1.2)
Total Protein: 6.4 g/dL — ABNORMAL LOW (ref 6.5–8.1)

## 2023-04-05 LAB — CBC WITH DIFFERENTIAL/PLATELET
Abs Immature Granulocytes: 0.02 10*3/uL (ref 0.00–0.07)
Basophils Absolute: 0 10*3/uL (ref 0.0–0.1)
Basophils Relative: 0 %
Eosinophils Absolute: 0.6 10*3/uL — ABNORMAL HIGH (ref 0.0–0.5)
Eosinophils Relative: 9 %
HCT: 34 % — ABNORMAL LOW (ref 36.0–46.0)
Hemoglobin: 10.5 g/dL — ABNORMAL LOW (ref 12.0–15.0)
Immature Granulocytes: 0 %
Lymphocytes Relative: 14 %
Lymphs Abs: 1 10*3/uL (ref 0.7–4.0)
MCH: 28.8 pg (ref 26.0–34.0)
MCHC: 30.9 g/dL (ref 30.0–36.0)
MCV: 93.4 fL (ref 80.0–100.0)
Monocytes Absolute: 0.3 10*3/uL (ref 0.1–1.0)
Monocytes Relative: 5 %
Neutro Abs: 5 10*3/uL (ref 1.7–7.7)
Neutrophils Relative %: 72 %
Platelets: 385 10*3/uL (ref 150–400)
RBC: 3.64 MIL/uL — ABNORMAL LOW (ref 3.87–5.11)
RDW: 14 % (ref 11.5–15.5)
WBC: 6.9 10*3/uL (ref 4.0–10.5)
nRBC: 0 % (ref 0.0–0.2)

## 2023-04-05 MED ORDER — MORPHINE SULFATE (PF) 4 MG/ML IV SOLN
4.0000 mg | Freq: Once | INTRAVENOUS | Status: AC
  Administered 2023-04-05: 4 mg via INTRAVENOUS
  Filled 2023-04-05: qty 1

## 2023-04-05 MED ORDER — ONDANSETRON HCL 4 MG/2ML IJ SOLN
4.0000 mg | Freq: Once | INTRAMUSCULAR | Status: AC
  Administered 2023-04-05: 4 mg via INTRAVENOUS
  Filled 2023-04-05: qty 2

## 2023-04-05 NOTE — Discharge Instructions (Signed)
1.  Will need to spend most of your time lying down and keeping intra-abdominal pressure off of your lower abdomen.  The more you sit and strain, the worse you will make the prolapse. 2.  Keep your rectal area clean and dry and apply Desitin ointment to avoid any irritation of the skin.  Reduce the prolapse and hold it in place if necessary for things such as coughing or straining. 3.  You must keep your stool very soft.  Take MiraLAX daily.  Take a stool softener twice daily.  Follow all instructions for constipation.  Narcotic medications will only worsen this condition. 4.  Continue to work with Designer, industrial/product and/or discuss your condition with your other specialist.

## 2023-04-05 NOTE — ED Provider Notes (Signed)
[place] EMERGENCY DEPARTMENT AT [facility] Provider Note   CSN: [id number] Arrival date & time: 04/05/23  0750     History  Chief Complaint  Patient presents with   Rectal Pain    [name] is a 72 y.o. female.  HPI Patient reports she has had problems as rectal prolapse for about 2 months.  She reports it comes out just about every time she tries to urinate or have a bowel movement or even cough.  Reports has been out all night.  She has had a lot of pain.  She has been trying to self reduce but been unable to do so.  No fever no chills no vomiting.  Patient denies any upper abdominal pain.    Home Medications Prior to Admission medications   Medication Sig Start Date End Date Taking? Authorizing Provider  amitriptyline (ELAVIL) 50 MG tablet Take 1 tablet (50 mg total) by mouth daily. 03/08/23   Adela Glimpse, NP  aspirin EC 81 MG tablet Take 81 mg by mouth daily.     [provider]  atenolol (TENORMIN) 50 MG tablet Take 50 mg by mouth daily.    [provider]  buPROPion (WELLBUTRIN XL) 150 MG 24 hr tablet Take 3 tablets (450 mg total) by mouth daily for depression. 06/02/22   Lucky Cowboy, MD  cetirizine (ZYRTEC) 10 MG tablet Take 10 mg by mouth daily.    [provider]  Cholecalciferol (VITAMIN D-3) 5000 UNITS TABS Take 5,000 Units by mouth daily.     [provider]  Cinnamon 500 MG capsule Take 500 mg by mouth daily.    [provider]  diazepam (VALIUM) 5 MG tablet Place 1 tablet vaginally nightly as needed for muscle spasm/ pelvic pain. 03/16/23   Marguerita Beards, MD  estradiol (ESTRACE) 0.1 MG/GM vaginal cream PLACE 1/2 GRAM VAGINALLY EVERY NIGHT AT BEDTIME FOR 2 WEEKS THEN USE 2 TIMES PER WEEK THEREAFTER 12/15/22   Marguerita Beards, MD  ferrous sulfate 325 (65 FE) MG EC tablet Take 1 tablet (325 mg total) by mouth daily with breakfast. 03/24/23   Adela Glimpse, NP  gabapentin (NEURONTIN) 100  MG capsule Take 1 capsule (100 mg total) by mouth 3 (three) times daily as needed (rectal pain). 04/02/23 05/02/23  Kommor, Madison, MD  guaifenesin (HUMIBID E) 400 MG TABS tablet Take 400 mg by mouth daily.    [provider]  HYDROcodone-acetaminophen (NORCO/VICODIN) 5-325 MG tablet Take 1 tablet by mouth every 4 (four) hours as needed. 04/02/23   Kommor, Madison, MD  hydrOXYzine (ATARAX) 25 MG tablet Take 1 tablet (25 mg total) by mouth every 8 (eight) hours as needed for anxiety (or bladder pain). 01/27/23   Marguerita Beards, MD  hyoscyamine (LEVSIN SL) 0.125 MG SL tablet DISSOLVE 1 TABLET UNDER THE TONGUE EVERY 4 HOURS IF NEEDED FOR NAUSEA, BLOATING, CRAMPING, OR DIARRHEA 12/11/22   Raynelle Dick, NP  ipratropium-albuterol (DUONEB) 0.5-2.5 (3) MG/3ML SOLN USE 1 VIAL IN NEBULIZER EVERY 4 HOURS AS NEEDED **MAX 6 DOSES A DAY** 11/24/19   Judd Gaudier, NP  levothyroxine (SYNTHROID) 100 MCG tablet Take  1 tablet  Daily  on an empty stomach with only water for 30 minutes & no Antacid meds, Calcium or Magnesium for 4 hours & avoid Biotin 08/11/22   Lucky Cowboy, MD  lidocaine-prilocaine (EMLA) cream Apply 1 Application topically as needed. 10/04/22   Selmer Dominion, NP  ondansetron (ZOFRAN) 8 MG tablet TAKE  1 TABLET BY MOUTH 3 TIMES A DAY AS NEEDED FOR NAUSEA Patient taking differently: Take 8 mg by mouth every 8 (eight) hours as needed. TAKE 1 TABLET BY MOUTH 3 TIMES A DAY AS NEEDED FOR NAUSEA 10/02/20   Judd Gaudier, NP  pantoprazole (PROTONIX) 20 MG tablet TAKE 1 TABLET BY MOUTH DAILY 08/17/22   Raynelle Dick, NP  rosuvastatin (CRESTOR) 5 MG tablet Take  1 tablet 3 x /week for Cholesterol                                                         /                    TAKE                                     BY                                  MOUTH 04/06/22   Lucky Cowboy, MD      Allergies    Ace inhibitors, Cephalexin, Dilaudid [hydromorphone hcl], Robaxin [methocarbamol],  Macrobid [nitrofurantoin], Levaquin [levofloxacin], Neurontin [gabapentin], Ciprofloxacin, Codeine, Latex, and Sulfa antibiotics    Review of Systems   Review of Systems  Physical Exam Updated Vital Signs BP 113/66   Pulse 69   Temp (!) 97.4 F (36.3 C) (Oral)   Resp 16   Ht 5\' 3"  (1.6 m)   Wt 79.5 kg   SpO2 90%   BMI 31.04 kg/m  Physical Exam Constitutional:      Comments: Patient is alert nontoxic in a lot of pain.  Mental status normal.  HENT:     Mouth/Throat:     Pharynx: Oropharynx is clear.  Eyes:     Extraocular Movements: Extraocular movements intact.  Cardiovascular:     Rate and Rhythm: Normal rate and regular rhythm.  Pulmonary:     Effort: Pulmonary effort is normal.     Breath sounds: Normal breath sounds.  Abdominal:     General: There is no distension.     Palpations: Abdomen is soft.     Tenderness: There is no abdominal tenderness. There is no guarding.  Genitourinary:    Comments: Patient has a large egg size rectal prolapse.  I was able to reduce this with constant steady pressure. Musculoskeletal:        General: Swelling present.     Comments: Patient has mild swelling of the right ankle.  Calves are soft without edema nontender.  No erythema.  Skin:    General: Skin is warm and dry.  Neurological:     General: No focal deficit present.     Mental Status: She is oriented to person, place, and time.     Motor: No weakness.     Coordination: Coordination normal.     Comments: Patient is neurologically intact.  Alert.  Speech clear.  Mental status clear.  Ambulatory with coordinated function.     ED Results / Procedures / Treatments   Labs (all labs ordered are listed, but only abnormal results are displayed) Labs Reviewed  COMPREHENSIVE METABOLIC PANEL - Abnormal;  Notable for the following components:      Result Value   Sodium 132 (*)    Glucose, Bld 104 (*)    BUN 31 (*)    Creatinine, Ser 1.67 (*)    Calcium 8.6 (*)    Total Protein  6.4 (*)    Albumin 3.2 (*)    GFR, Estimated 33 (*)    All other components within normal limits  CBC WITH DIFFERENTIAL/PLATELET - Abnormal; Notable for the following components:   RBC 3.64 (*)    Hemoglobin 10.5 (*)    HCT 34.0 (*)    Eosinophils Absolute 0.6 (*)    All other components within normal limits    EKG None  Radiology CT ABDOMEN PELVIS WO CONTRAST  Result Date: 04/05/2023 CLINICAL DATA:  Perineal pain, post-menopausal low pelvic rectal pain, reduced a rectal prolapse (occurs repeatedly) R/O other pathology EXAM: CT ABDOMEN AND PELVIS WITHOUT CONTRAST TECHNIQUE: Multidetector CT imaging of the abdomen and pelvis was performed following the standard protocol without IV contrast. RADIATION DOSE REDUCTION: This exam was performed according to the departmental dose-optimization program which includes automated exposure control, adjustment of the mA and/or kV according to patient size and/or use of iterative reconstruction technique. COMPARISON:  CT scan abdomen and pelvis from 12/24/2022. FINDINGS: Lower chest: There are peripheral/subpleural reticulations in the visualized bilateral lung bases with patchy areas of bronchiectasis and honeycombing, concerning for underlying pulmonary fibrosis. No consolidation or lung collapse. No pleural effusion or pneumothorax. There is a moderate-to-large hiatal hernia containing more than half of the proximal stomach. There is small fat containing right posteromedial diaphragmatic hernia. The heart is normal in size. No pericardial effusion. There is apparent hypoattenuation of the blood pool relative to the myocardium, suggestive of anemia. Hepatobiliary: The liver is normal in size. Non-cirrhotic configuration. No suspicious mass. No intrahepatic or extrahepatic bile duct dilation. No calcified gallstones. Normal gallbladder wall thickness. No pericholecystic inflammatory changes. Pancreas: Unremarkable. No pancreatic ductal dilatation or surrounding  inflammatory changes. Spleen: Within normal limits. No focal lesion. Adrenals/Urinary Tract: Adrenal glands are unremarkable. No suspicious renal mass. Persistent fetal lobulations noted. No hydronephrosis. Note is made of extrarenal right pelvis. No renal or ureteric calculi. There is subtle asymmetric fat stranding surrounding the left aspect of the urinary bladder. No focal mass or calculi. Correlate clinically for cystitis. Stomach/Bowel: No disproportionate dilation of the small or large bowel loops. No evidence of abnormal bowel wall thickening or inflammatory changes. The appendix was not visualized; however there is no acute inflammatory process in the right lower quadrant. There are multiple diverticula mainly in the sigmoid colon, without imaging signs of diverticulitis. Note is made of mild descent of rectum below the pubococcygeal line (approximately 1.7 cm). Vascular/Lymphatic: No ascites or pneumoperitoneum. No abdominal or pelvic lymphadenopathy, by size criteria. No aneurysmal dilation of the major abdominal arteries. There are marked peripheral atherosclerotic vascular calcifications of the aorta and its major branches. Reproductive: The uterus is surgically absent. No large adnexal mass. Other: There is a small fat containing periumbilical hernia. The soft tissues and abdominal wall are otherwise unremarkable. Musculoskeletal: No suspicious osseous lesions. There are mild - moderate multilevel degenerative changes in the visualized spine. Redemonstration of moderate compression deformity of T12 vertebra. IMPRESSION: *There is subtle asymmetric fat stranding surrounding the left wall of the urinary bladder. Findings can be seen with cystitis. Correlate clinically and with urinalysis. *Otherwise, no acute inflammatory process identified within the abdomen or pelvis. No bowel obstruction. *Mild rectal prolapse. *Multiple  other nonacute observations, as described above. Aortic Atherosclerosis  (ICD10-I70.0). Electronically Signed   By: Jules Schick M.D.   On: 04/05/2023 10:28    Procedures Procedures   Reduction of rectal prolapse.  With constant steady pressure prolapse reduced fairly easily. Medications Ordered in ED Medications  morphine (PF) 4 MG/ML injection 4 mg (4 mg Intravenous Given 04/05/23 0910)  ondansetron (ZOFRAN) injection 4 mg (4 mg Intravenous Given 04/05/23 0911)    ED Course/ Medical Decision Making/ A&P                                 Medical Decision Making Amount and/or Complexity of Data Reviewed Labs: ordered. Radiology: ordered.  Risk Prescription drug management.   Patient has history of recurrent rectal prolapse.  She is expressing severe rectal and pelvic pain.  Is nontoxic and alert.  She does not have other comorbid issues of vomiting, fever.  Patient reports she chronically has discomfort with urination with interstitial cystitis.  On examination patient is tearful and trying to sit on a bedside commode.  In a semirecumbent position I was able to reduce a moderately sized rectal prolapse easily with moderate pressure.  I did ask the patient to stay in a supine position while.  However, upon recheck at 08: 50, the patient was standing at the bedside and back on the bedside commode gripping her pelvic area and tearful reporting severe pain.  She reports she has to have something for pain and would rather be dead than live with this pain.  At this time we will proceed with IV pain medication of morphine and a dose of Zofran.  Will obtain basic lab work and a CT abdomen to rule out inflammatory/obstructive or acute process in the pelvis.  GFR 33.  Normal potassium 4.9 white count 6.9 H&H 10.5 and 34  CT abdomen pelvis visually reviewed by myself and reviewed by radiology, will multiple chronic findings.  No acute findings present.  Consult: Reviewed with Dr. Maisie Fus.  She advises patient needs to keep the area clean and dry and apply Desitin  ointment.  At this time with reducible prolapse and no acute CT findings, patient needs to avoid narcotics, continue to manage constipation and follow-up in the office.  The patient reported good pain control with morphine.  We discussed that she cannot be treated with narcotics on outpatient basis.  After I had reduced the prolapse, patient felt most comfortable sitting on the bedside commode, reporting that that is her position of comfort.  Patient advises that she can successfully reduce the prolapse at home but it is just happening so frequently and she never feels comfortable.  She is very frustrated and wants better pain control and treatment sooner.  We discussed the ways to manage this at home and to anticipate that strict management at home will effectively keep it mostly reduced and will help with pain.  We discussed the fact that surgical treatment is likely the only definitive manage that is going to resolve the prolapse, and she needs to do all the preparations as described by Dr. Maisie Fus. Or, as she mentioned, she is free to discuss referral to other colorectal specialist if she is able to get a surgical date sooner.  I empathized with the difficulty of managing with this condition but encouraged the patient to be very strict in her activities and following all measures to avoid constipation.  She did wish  to have narcotic pain control due to the mount of pain she has but I have reiterated that that will really only worsen the condition overall.         Final Clinical Impression(s) / ED Diagnoses Final diagnoses:  Rectal prolapse    Rx / DC Orders ED Discharge Orders     None         Arby Barrette, MD 04/05/23 1128

## 2023-04-05 NOTE — ED Triage Notes (Addendum)
Pt is coming from home by [facility] for rectal pain that started at 3am. Pt reported no relief with prescription pain meds. No bowel movement in 3 days. Painful urination. Ambulatory. Patient seen recently for same.  BP 148/100

## 2023-04-06 ENCOUNTER — Other Ambulatory Visit: Payer: Self-pay | Admitting: Internal Medicine

## 2023-04-06 MED ORDER — GABAPENTIN 600 MG PO TABS: ORAL_TABLET | ORAL | 1 refills | Status: DC

## 2023-04-07 ENCOUNTER — Encounter: Payer: Self-pay | Admitting: Physical Therapy

## 2023-04-07 ENCOUNTER — Ambulatory Visit: Payer: PPO | Admitting: Physical Therapy

## 2023-04-07 DIAGNOSIS — R102 Pelvic and perineal pain: Secondary | ICD-10-CM

## 2023-04-07 DIAGNOSIS — R279 Unspecified lack of coordination: Secondary | ICD-10-CM

## 2023-04-07 DIAGNOSIS — M6281 Muscle weakness (generalized): Secondary | ICD-10-CM | POA: Diagnosis not present

## 2023-04-07 NOTE — Therapy (Signed)
OUTPATIENT PHYSICAL THERAPY FEMALE PELVIC TREATMENT   Patient Name: [name] MRN: [id number] DOB:1950/12/10, 72 y.o., female Today's Date: 04/07/2023  END OF SESSION:  PT End of Session - 04/07/23 1400     Visit Number 6    Date for PT Re-Evaluation 04/16/23    Authorization Type [employer]    Authorization - Visit Number 6    Authorization - Number of Visits 20    PT Start Time 1400    PT Stop Time 1440    PT Time Calculation (min) 40 min    Activity Tolerance Patient tolerated treatment well    Behavior During Therapy [facility] for tasks assessed/performed             Past Medical History:  Diagnosis Date   Chronic diastolic CHF (congestive heart failure) (HCC)    cardriologist--- dr Lacretia Nicks. Flora Lipps;   dx in setting sepsis 11/ 2020;   echo in epic 03-10-2019 ef 55-60%, G1DD, mild to moderate TR no stenosis and normal RASP;  CTA 01-31-2021 epic   Chronic kidney disease (CKD), stage III (moderate) (HCC)    nephrologist--- dr Isa Rankin   Depression    Diverticulosis of colon    Emphysema of lung (HCC)    no home oxygen   GERD (gastroesophageal reflux disease)    History of acute pyelonephritis    w/ hx sepsis   History of Helicobacter pylori infection 2012   History of recurrent UTIs    History of TIA (transient ischemic attack)    TIAs   Hyperlipidemia, mixed    Hypertension    followed by pcp and nephrologist----  (nuclear stress test in epic 06-28-2015 low risk no ischemia, nuclear ef 60%) no meds   Hypothyroidism    IBS (irritable bowel syndrome)    IC (interstitial cystitis)    remote hx   Inguinal hernia, bilateral    Irritable bowel syndrome with constipation and diarrhea    LBBB (left bundle branch block) 2015   cardiologist --- dr Flora Lipps;  known chronic LBBB for several yrs   OCD (obsessive compulsive disorder)    OSA (obstructive sleep apnea)    no CPAP intolerant   Osteopenia    Prolapsed hemorrhoids    Seasonal allergies    Vitamin D deficiency     Past Surgical History:  Procedure Laterality Date   ANTERIOR AND POSTERIOR REPAIR WITH SACROSPINOUS FIXATION N/A 11/10/2021   Procedure: POSTERIOR REPAIR WITH SACROSPINOUS FIXATION;  Surgeon: Marguerita Beards, MD;  Location: [facility];  Service: Gynecology;  Laterality: N/A;  total time requested is 1.5 hrs   BLADDER SUSPENSION N/A 11/10/2021   Procedure: TRANSVAGINAL TAPE (TVT) PROCEDURE;  Surgeon: Marguerita Beards, MD;  Location: [facility];  Service: Gynecology;  Laterality: N/A;   BREAST ENHANCEMENT SURGERY Bilateral    1970s   BREAST IMPLANT REMOVAL Bilateral 03/07/2019   BUNIONECTOMY Bilateral 1997   CATARACT EXTRACTION W/ INTRAOCULAR LENS IMPLANT Right 2005   COLONOSCOPY  10/2020   CYSTOSCOPY WITH FULGERATION  11/10/2021   Procedure: CYSTOSCOPY WITH FULGERATION;  Surgeon: Marguerita Beards, MD;  Location: [facility];  Service: Gynecology;;   ESOPHAGOGASTRODUODENOSCOPY (EGD) WITH PROPOFOL N/A 03/12/2019   Procedure: ESOPHAGOGASTRODUODENOSCOPY (EGD) WITH PROPOFOL;  Surgeon: Jeani Hawking, MD;  Location: [facility] ENDOSCOPY;  Service: Endoscopy;  Laterality: N/A;   HEMORRHOID SURGERY  1976   INGUINAL HERNIA REPAIR N/A 04/10/2021   Procedure: LAPAROSCOPIC LEFT INGUINAL HERNIA REPAIR AND RIGHT INGUINAL HERNII REPAIR WITH TAP BLOCK;  Surgeon: Karie Soda, MD;  Location: [facility];  Service: General;  Laterality: N/A;   ORIF ANKLE FRACTURE Left 08/30/2017   Procedure: OPEN REDUCTION INTERNAL FIXATION (ORIF) ANKLE FRACTURE;  Surgeon: Samson Frederic, MD;  Location: [facility] OR;  Service: Orthopedics;  Laterality: Left;   TONSILLECTOMY     CHILD   Patient Active Problem List   Diagnosis Date Noted   Coronary atherosclerosis of native coronary artery (per CTA 01/31/2021)  06/14/2021   Interstitial cystitis 06/14/2020   Overweight (BMI 25.0-29.9) 06/13/2020   Former smoker (40 pack year, quit 2004) 08/30/2019    Atherosclerosis of aorta (HCC) by Chest CT scan in 2020  03/22/2019   Emphysema lung (HCC) - per CT 03/09/2019    Chronic diastolic CHF (congestive heart failure) (HCC) 03/11/2019   Osteopenia 01/16/2019   History of colon polyps 06/28/2018   CKD Stage 3 (HCC) 01/10/2015   IBS  01/10/2015   Hyperlipidemia, mixed 06/12/2014   Medication management 06/12/2014   LBBB (left bundle branch block) 07/24/2013   HTN (hypertension) 07/24/2013   Major depression, recurrent, full remission (HCC)    OCD (obsessive compulsive disorder)    Abnormal glucose    Migraines    Vitamin D deficiency    Hypothyroidism 04/05/2013    PCP: Langley Adie, MD  REFERRING PROVIDER: [phone] (ICD-10-CM) - Bladder pain   REFERRING DIAG: Marguerita Beards, MD   THERAPY DIAG:  Muscle weakness (generalized)  Unspecified lack of coordination  Pelvic pain  Rationale for Evaluation and Treatment: Rehabilitation  ONSET DATE: 08/13/22  SUBJECTIVE:                                                                                                                                                                                           SUBJECTIVE STATEMENT: I went to the ER for my prolapse. I feel better today. I am using the metamucil. I am having burning when I urinate. I am having trouble breathing and feel swollen all over. Patient has not had a urinary incontinence episode this week.   Fluid intake: Yes: water, coffee in morning    PAIN:  Are you having pain? Yes NPRS scale: 6/10  on 03/31/23 Pain location: Anal and Vaginal  Pain type: aching, burning, and sharp Pain description: intermittent   Aggravating factors: food she eats, straining Relieving factors: relax, medicine  PRECAUTIONS: None  RED FLAGS: None   WEIGHT BEARING RESTRICTIONS: No  FALLS:  Has patient fallen in last 6 months? No  LIVING ENVIRONMENT: Lives with: lives alone  OCCUPATION: retired  PLOF: Independent  PATIENT  GOALS: manage rectal prolapse and pain, not  strain to urinate  PERTINENT HISTORY:  Stage III kidney disease; Hemorrhoid surgery 1976; Inguinal hernia repair; IBS; IC; OCD; Osteopenia; anterior and posterior repair with sacrospinous fixation 2023; bladder suspension 2023 Sexual abuse: No  BOWEL MOVEMENT: Pain with bowel movement: Yes, 6/10 sometimes pain is in vaginal area at the same time Type of bowel movement:Type (Bristol Stool Scale) type 3, Frequency daily, Strain Yes, and Splinting none Fully empty rectum: No Leakage: No Fiber supplement: Yes: Beneful  URINATION: Pain with urination: Yes, 8/10 Fully empty bladder: Yes:   strains to urinate Stream: Weak Urgency: Yes: but when she urinates the amount is small Frequency: night time every hour so she does not sleep; every 2-3 hours Leakage: Urge to void, Walking to the bathroom, Coughing, Sneezing, Laughing, and Exercise Pads: Yes: day 5, night 1 pad  INTERCOURSE:not active  PREGNANCY: Vaginal deliveries 3  PROLAPSE: Rectal prolapse   OBJECTIVE:  Note: Objective measures were completed at Evaluation unless otherwise noted.  DIAGNOSTIC FINDINGS:  none  COGNITION: Overall cognitive status: Within functional limits for tasks assessed     SENSATION: Light touch: Appears intact Proprioception: Appears intact    POSTURE: rounded shoulders and forward head  PELVIC ALIGNMENT:  LUMBARAROM/PROM: lumbar ROM is full   LOWER EXTREMITY ROM: Bilateral hip ROM is full   LOWER EXTREMITY MMT: bilateral hip strength is 4/5  PALPATION:   General  firmness in abdomen, able to contract the abdomin correctly, press suprapubically makes her feel like she needs to urinate, decreased mobility of lower rib cage.                 External Perineal Exam gapping anus                             Internal Pelvic Floor tenderness in the rectal canal, tenderness in bilateral levator ani, obturator internist, along the bladder and  urethra  Patient confirms identification and approves PT to assess internal pelvic floor and treatment Yes  PELVIC MMT:   MMT eval 03/12/23 04/07/23  Vaginal 1/5 2/5 3/5  Internal Anal Sphincter 2/5 with small lift    External Anal Sphincter 2/5 with small lift    Puborectalis 2/5    (Blank rows = not tested)        TONE: Increased on the right  PROLAPSE: Rectal prolapse  TODAY'S TREATMENT:    04/07/23 Manual: Internal pelvic floor techniques: No emotional/communication barriers or cognitive limitation. Patient is motivated to learn. Patient understands and agrees with treatment goals and plan. PT explains patient will be examined in standing, sitting, and lying down to see how their muscles and joints work. When they are ready, they will be asked to remove their underwear so PT can examine their perineum. The patient is also given the option of providing their own chaperone as one is not provided in our facility. The patient also has the right and is explained the right to defer or refuse any part of the evaluation or treatment including the internal exam. With the patient's consent, PT will use one gloved finger to gently assess the muscles of the pelvic floor, seeing how well it contracts and relaxes and if there is muscle symmetry. After, the patient will get dressed and PT and patient will discuss exam findings and plan of care. PT and patient discuss plan of care, schedule, attendance policy and HEP activities.  Going through vaginally working on the sides of the introitus.  Along the levator ani, sides of the bladder and posterior vaginal canal Neuromuscular re-education: Pelvic floor contraction training: Therapist finger in the vaginal canal working on 10 second contraction and quick contractions    03/31/23 Manual: Soft tissue mobilization: Manual work to the abdomen and diaphragm to improve tissue mobility Circular massage to the abdomen to improve peristalic motion of the  intestines.  Fascial release of the lower abdomen to improve tissue mobility around the bladder Exercises: Strengthening: Nustep for 8 minutes at level 5 while assessing patient supine ball squeeze with pelvic floor contraction hold 5 sec 15 X Supine clamshell with red band and pelvic floor contraction 15 x each leg Laying on side hip internal rotation 15 x on each side Standing alternate shoulder and hip lifting against floor.   03/12/23 Manual: Internal pelvic floor techniques: No emotional/communication barriers or cognitive limitation. Patient is motivated to learn. Patient understands and agrees with treatment goals and plan. PT explains patient will be examined in standing, sitting, and lying down to see how their muscles and joints work. When they are ready, they will be asked to remove their underwear so PT can examine their perineum. The patient is also given the option of providing their own chaperone as one is not provided in our facility. The patient also has the right and is explained the right to defer or refuse any part of the evaluation or treatment including the internal exam. With the patient's consent, PT will use one gloved finger to gently assess the muscles of the pelvic floor, seeing how well it contracts and relaxes and if there is muscle symmetry. After, the patient will get dressed and PT and patient will discuss exam findings and plan of care. PT and patient discuss plan of care, schedule, attendance policy and HEP activities.  Going through the vaginal canal working on the levator ani, obturator internist. Fascial release on the sides of the bladder and along the urethra with one finger internally and other suprapubic area going slowly and monitoring for pain.      PATIENT EDUCATION:  03/05/23 Education details: Access Code: [email or web address], educated patient to lay on her back when she feels her rectal prolapse to reset and reduce her pain, urge to void Person educated:  Patient Education method: Explanation, Demonstration, Tactile cues, Verbal cues, and Handouts Education comprehension: verbalized understanding, returned demonstration, verbal cues required, tactile cues required, and needs further education  HOME EXERCISE PROGRAM: 03/05/23 Access Code: [email or web address] URL: [email or web address] Date: 03/05/2023 Prepared by: Eulis Foster  Program Notes sit on ball and massage the pelvic floor.   Exercises - Supine Pelvic Floor Contraction  - 3 x daily - 7 x weekly - 1 sets - 5 reps - 10 sec hold - Supine Pelvic Floor Stretch  - 1 x daily - 7 x weekly - 1 sets - 1 reps - 30 sec hold - Supine Figure 4 Piriformis Stretch with Leg Extension  - 1 x daily - 7 x weekly - 1 sets - 1 reps - 30 sec hold - Supine Hip Adduction Isometric with Ball  - 1 x daily - 7 x weekly - 1 sets - 10 reps - Supine Bridge with Mini Swiss Ball Between Knees  - 1 x daily - 7 x weekly - 1 sets - 10 reps - Hooklying Clamshell with Resistance  - 1 x daily - 7 x weekly - 1 sets - 10 reps Access Code: [email or web address] URL: [email or web address] Date: 02/26/2023 Prepared by: Eulis Foster  Program Notes  sit on ball and massage the pelvic floor.   Exercises - Supine Pelvic Floor Contraction  - 3 x daily - 7 x weekly - 1 sets - 5 reps - 10 sec hold - Supine Pelvic Floor Stretch  - 1 x daily - 7 x weekly - 1 sets - 1 reps - 30 sec hold - Supine Figure 4 Piriformis Stretch with Leg Extension  - 1 x daily - 7 x weekly - 1 sets - 1 reps - 30 sec hold  ASSESSMENT:  CLINICAL IMPRESSION: Patient is a 72 y.o. female who was seen today for physical therapy  treatment for bladder pain. Pelvic floor strength is 3/5 holding for 10 sec and able to quick contraction.  She has not had urinary leakage this week. She has had issues with her rectal prolapse and had to go to the emergency room. She is starting to use the metamucil and has helped loosen her stool. Patient will benefit from  skilled therapy to work on pelvic floor strength and reduction of pain to improve urination and bowel movements.   OBJECTIVE IMPAIRMENTS: decreased coordination, decreased strength, increased fascial restrictions, increased muscle spasms, and pain.   ACTIVITY LIMITATIONS: sitting, standing, continence, toileting, and locomotion level  PARTICIPATION LIMITATIONS: meal prep, cleaning, laundry, shopping, and community activity  PERSONAL FACTORS: Age, Fitness, Time since onset of injury/illness/exacerbation, and 1-2 comorbidities: Stage III kidney disease; Hemorrhoid surgery 1976; Inguinal hernia repair; IBS; IC; OCD; Osteopenia; aterior and posterior repair with sacrospinous fixation 2023; bladder suspension 2023  are also affecting patient's functional outcome.   REHAB POTENTIAL: Good  CLINICAL DECISION MAKING: Evolving/moderate complexity  EVALUATION COMPLEXITY: Moderate   GOALS: Goals reviewed with patient? Yes  SHORT TERM GOALS: Target date: 03/18/23  Patient independent with initial HEP for pelvic floor and core strength.  Baseline: Goal status: Met 03/05/23  2.  Patient is independent with flexibility exercises to relax the pelvic floor muscles.  Baseline:  Goal status: Met 03/12/23  3.  Patient is educated on ways to manage her rectal prolapse.  Baseline:  Goal status: Met 04/07/23  4.  Patient is educated on ways to manage her pelvic pain.  Baseline:  Goal status: Met 03/12/23   LONG TERM GOALS: Target date: 04/16/23  Patient is independent with advanced HEP for core and pelvic floor to reduce leakage.  Baseline:  Goal status: INITIAL  2.  Patient is able to have a bowel movement or urinate with pain level </= 1-2/10 due to the ability to relax her muscles and use breath.  Baseline:  Goal status: INITIAL  3.  Patient is able to urinate </= 2 times per night due to reduction of the urge to void.  Baseline:  Goal status: INITIAL  4.  Patient reports her urinary  leakage decreased >/= 75% due to improved pelvic floor strength >/= 3/5 and reduction of trigger points.  Baseline: dribble instead of full bladder release Goal status: ongoing 04/07/23  5.  Patient is able to urinate with straining </= 75% due to improve relaxation of pelvic floor.  Baseline: 10% better Goal status: INITIAL   PLAN:  PT FREQUENCY: 1x/week  PT DURATION: 8 weeks  PLANNED INTERVENTIONS: 97110-Therapeutic exercises, 97530- Therapeutic activity, [id number]- Neuromuscular re-education, 97140- Manual therapy, 97014- Electrical stimulation (unattended), 97035- Ultrasound, Patient/Family education, Dry Needling, Cryotherapy, Moist heat, and Biofeedback  PLAN FOR NEXT SESSION: manual work to abdomen on left to work on diaphragmatic breathing,  manual work to pelvic floor around bladder and urethra and  levator ani  Eulis Foster, PT 04/07/23 2:43 PM

## 2023-04-12 ENCOUNTER — Encounter: Payer: Self-pay | Admitting: Nurse Practitioner

## 2023-04-14 ENCOUNTER — Encounter: Payer: Self-pay | Admitting: Physical Therapy

## 2023-04-14 ENCOUNTER — Ambulatory Visit: Payer: PPO | Admitting: Physical Therapy

## 2023-04-14 ENCOUNTER — Other Ambulatory Visit: Payer: Self-pay

## 2023-04-14 ENCOUNTER — Ambulatory Visit (INDEPENDENT_AMBULATORY_CARE_PROVIDER_SITE_OTHER): Payer: PPO

## 2023-04-14 VITALS — BP 102/60 | HR 66 | Temp 97.8°F | Ht 63.0 in | Wt 179.0 lb

## 2023-04-14 DIAGNOSIS — E038 Other specified hypothyroidism: Secondary | ICD-10-CM | POA: Diagnosis not present

## 2023-04-14 DIAGNOSIS — M6281 Muscle weakness (generalized): Secondary | ICD-10-CM | POA: Diagnosis not present

## 2023-04-14 DIAGNOSIS — R102 Pelvic and perineal pain: Secondary | ICD-10-CM

## 2023-04-14 DIAGNOSIS — E039 Hypothyroidism, unspecified: Secondary | ICD-10-CM | POA: Diagnosis not present

## 2023-04-14 DIAGNOSIS — R7989 Other specified abnormal findings of blood chemistry: Secondary | ICD-10-CM | POA: Diagnosis not present

## 2023-04-14 DIAGNOSIS — R279 Unspecified lack of coordination: Secondary | ICD-10-CM

## 2023-04-14 NOTE — Progress Notes (Signed)
Patient presents to the office for a nurse visit to have labs done to recheck a TSH. States that she is taking Levothyroxine, once daily and takes it at 3 in the morning.

## 2023-04-14 NOTE — Therapy (Signed)
OUTPATIENT PHYSICAL THERAPY FEMALE PELVIC TREATMENT   Patient Name: [name] MRN: [id number] DOB:March 29, 1951, 72 y.o., female Today's Date: 04/14/2023  END OF SESSION:  PT End of Session - 04/14/23 1150     Visit Number 7    Date for PT Re-Evaluation 04/16/23    Authorization Type [employer]    Authorization - Visit Number 7    Authorization - Number of Visits 20    PT Start Time 1145    PT Stop Time 1225    PT Time Calculation (min) 40 min    Activity Tolerance Patient tolerated treatment well    Behavior During Therapy WFL for tasks assessed/performed             Past Medical History:  Diagnosis Date   Chronic diastolic CHF (congestive heart failure) (HCC)    cardriologist--- dr Lacretia Nicks. Flora Lipps;   dx in setting sepsis 11/ 2020;   echo in epic 03-10-2019 ef 55-60%, G1DD, mild to moderate TR no stenosis and normal RASP;  CTA 01-31-2021 epic   Chronic kidney disease (CKD), stage III (moderate) (HCC)    nephrologist--- dr Isa Rankin   Depression    Diverticulosis of colon    Emphysema of lung (HCC)    no home oxygen   GERD (gastroesophageal reflux disease)    History of acute pyelonephritis    w/ hx sepsis   History of Helicobacter pylori infection 2012   History of recurrent UTIs    History of TIA (transient ischemic attack)    TIAs   Hyperlipidemia, mixed    Hypertension    followed by pcp and nephrologist----  (nuclear stress test in epic 06-28-2015 low risk no ischemia, nuclear ef 60%) no meds   Hypothyroidism    IBS (irritable bowel syndrome)    IC (interstitial cystitis)    remote hx   Inguinal hernia, bilateral    Irritable bowel syndrome with constipation and diarrhea    LBBB (left bundle branch block) 2015   cardiologist --- dr Flora Lipps;  known chronic LBBB for several yrs   OCD (obsessive compulsive disorder)    OSA (obstructive sleep apnea)    no CPAP intolerant   Osteopenia    Prolapsed hemorrhoids    Seasonal allergies    Vitamin D deficiency     Past Surgical History:  Procedure Laterality Date   ANTERIOR AND POSTERIOR REPAIR WITH SACROSPINOUS FIXATION N/A 11/10/2021   Procedure: POSTERIOR REPAIR WITH SACROSPINOUS FIXATION;  Surgeon: Marguerita Beards, MD;  Location: [facility];  Service: Gynecology;  Laterality: N/A;  total time requested is 1.5 hrs   BLADDER SUSPENSION N/A 11/10/2021   Procedure: TRANSVAGINAL TAPE (TVT) PROCEDURE;  Surgeon: Marguerita Beards, MD;  Location: [facility];  Service: Gynecology;  Laterality: N/A;   BREAST ENHANCEMENT SURGERY Bilateral    1970s   BREAST IMPLANT REMOVAL Bilateral 03/07/2019   BUNIONECTOMY Bilateral 1997   CATARACT EXTRACTION W/ INTRAOCULAR LENS IMPLANT Right 2005   COLONOSCOPY  10/2020   CYSTOSCOPY WITH FULGERATION  11/10/2021   Procedure: CYSTOSCOPY WITH FULGERATION;  Surgeon: Marguerita Beards, MD;  Location: [facility];  Service: Gynecology;;   ESOPHAGOGASTRODUODENOSCOPY (EGD) WITH PROPOFOL N/A 03/12/2019   Procedure: ESOPHAGOGASTRODUODENOSCOPY (EGD) WITH PROPOFOL;  Surgeon: Jeani Hawking, MD;  Location: [facility] ENDOSCOPY;  Service: Endoscopy;  Laterality: N/A;   HEMORRHOID SURGERY  1976   INGUINAL HERNIA REPAIR N/A 04/10/2021   Procedure: LAPAROSCOPIC LEFT INGUINAL HERNIA REPAIR AND RIGHT INGUINAL HERNII REPAIR WITH TAP BLOCK;  Surgeon: Karie Soda, MD;  Location: [facility];  Service: General;  Laterality: N/A;   ORIF ANKLE FRACTURE Left 08/30/2017   Procedure: OPEN REDUCTION INTERNAL FIXATION (ORIF) ANKLE FRACTURE;  Surgeon: Samson Frederic, MD;  Location: [facility];  Service: Orthopedics;  Laterality: Left;   TONSILLECTOMY     CHILD   Patient Active Problem List   Diagnosis Date Noted   Coronary atherosclerosis of native coronary artery (per CTA 01/31/2021)  06/14/2021   Interstitial cystitis 06/14/2020   Overweight (BMI 25.0-29.9) 06/13/2020   Former smoker (40 pack year, quit 2004) 08/30/2019    Atherosclerosis of aorta (HCC) by Chest CT scan in 2020  03/22/2019   Emphysema lung (HCC) - per CT 03/09/2019    Chronic diastolic CHF (congestive heart failure) (HCC) 03/11/2019   Osteopenia 01/16/2019   History of colon polyps 06/28/2018   CKD Stage 3 (HCC) 01/10/2015   IBS  01/10/2015   Hyperlipidemia, mixed 06/12/2014   Medication management 06/12/2014   LBBB (left bundle branch block) 07/24/2013   HTN (hypertension) 07/24/2013   Major depression, recurrent, full remission (HCC)    OCD (obsessive compulsive disorder)    Abnormal glucose    Migraines    Vitamin D deficiency    Hypothyroidism 04/05/2013    PCP: Langley Adie, MD  REFERRING PROVIDER: [phone] (ICD-10-CM) - Bladder pain   REFERRING DIAG: Marguerita Beards, MD   THERAPY DIAG:  Muscle weakness (generalized)  Unspecified lack of coordination  Pelvic pain  Rationale for Evaluation and Treatment: Rehabilitation  ONSET DATE: 08/13/22  SUBJECTIVE:                                                                                                                                                                                           SUBJECTIVE STATEMENT: I am incontinence again. I see the nurse today. I took Lasik but did not help with the fluid build up.   Fluid intake: Yes: water, coffee in morning    PAIN:  Are you having pain? Yes NPRS scale: 3/10  on 03/31/23 Pain location: Anal and Vaginal  Pain type: aching, burning, and sharp Pain description: intermittent   Aggravating factors: food she eats, straining Relieving factors: relax, medicine  PRECAUTIONS: None  RED FLAGS: None   WEIGHT BEARING RESTRICTIONS: No  FALLS:  Has patient fallen in last 6 months? No  LIVING ENVIRONMENT: Lives with: lives alone  OCCUPATION: retired  PLOF: Independent  PATIENT GOALS: manage rectal prolapse and pain, not strain to urinate  PERTINENT HISTORY:  Stage III kidney disease; Hemorrhoid surgery  1976; Inguinal hernia repair; IBS; IC; OCD; Osteopenia; anterior and  posterior repair with sacrospinous fixation 2023; bladder suspension 2023 Sexual abuse: No  BOWEL MOVEMENT: Pain with bowel movement: Yes, 6/10 sometimes pain is in vaginal area at the same time Type of bowel movement:Type (Bristol Stool Scale) type 3, Frequency daily, Strain Yes, and Splinting none Fully empty rectum: No Leakage: No Fiber supplement: Yes: Beneful  URINATION: Pain with urination: Yes, 8/10 Fully empty bladder: Yes:   strains to urinate Stream: Weak Urgency: Yes: but when she urinates the amount is small Frequency: night time every hour so she does not sleep Leakage: Urge to void, Walking to the bathroom, Coughing, Sneezing, Laughing, and Exercise Pads: Yes: day 5, night 1 pad  INTERCOURSE:not active  PREGNANCY: Vaginal deliveries 3  PROLAPSE: Rectal prolapse   OBJECTIVE:  Note: Objective measures were completed at Evaluation unless otherwise noted.  DIAGNOSTIC FINDINGS:  none  COGNITION: Overall cognitive status: Within functional limits for tasks assessed     SENSATION: Light touch: Appears intact Proprioception: Appears intact    POSTURE: rounded shoulders and forward head  PELVIC ALIGNMENT:  LUMBARAROM/PROM: lumbar ROM is full   LOWER EXTREMITY ROM: Bilateral hip ROM is full   LOWER EXTREMITY MMT: bilateral hip strength is 4/5  PALPATION:   General  firmness in abdomen, able to contract the abdomin correctly, press suprapubically makes her feel like she needs to urinate, decreased mobility of lower rib cage.                 External Perineal Exam gapping anus                             Internal Pelvic Floor tenderness in the rectal canal, tenderness in bilateral levator ani, obturator internist, along the bladder and urethra  Patient confirms identification and approves PT to assess internal pelvic floor and treatment Yes  PELVIC MMT:   MMT eval 03/12/23 04/07/23  04/14/23  Vaginal 1/5 2/5 3/5 3/5 supine; 2/5 standing  Internal Anal Sphincter 2/5 with small lift     External Anal Sphincter 2/5 with small lift     Puborectalis 2/5     (Blank rows = not tested)        TONE: Increased on the right  PROLAPSE: Rectal prolapse  TODAY'S TREATMENT:    04/14/23 Manual: Internal pelvic floor techniques: No emotional/communication barriers or cognitive limitation. Patient is motivated to learn. Patient understands and agrees with treatment goals and plan. PT explains patient will be examined in standing, sitting, and lying down to see how their muscles and joints work. When they are ready, they will be asked to remove their underwear so PT can examine their perineum. The patient is also given the option of providing their own chaperone as one is not provided in our facility. The patient also has the right and is explained the right to defer or refuse any part of the evaluation or treatment including the internal exam. With the patient's consent, PT will use one gloved finger to gently assess the muscles of the pelvic floor, seeing how well it contracts and relaxes and if there is muscle symmetry. After, the patient will get dressed and PT and patient will discuss exam findings and plan of care. PT and patient discuss plan of care, schedule, attendance policy and HEP activities.  Going through the vaginal canal performing manual work to the levator ani, obturator internist, along the introitus, and the right side of the bladder Neuromuscular re-education: Pelvic floor  contraction training: Therapist finger in the vaginal canal in supine working on pelvic floor contraction and holding Therapist finger in the vaginal canal working on pelvic floor contraction in standing with tactile and verbal cues on contracting and lifting the pelvic floor.  Standing with pressing feet into floor and pull together to contract the pelvic floor and use hands on the lower abdomen to  contract the pelvic floor.     04/07/23 Manual: Internal pelvic floor techniques: No emotional/communication barriers or cognitive limitation. Patient is motivated to learn. Patient understands and agrees with treatment goals and plan. PT explains patient will be examined in standing, sitting, and lying down to see how their muscles and joints work. When they are ready, they will be asked to remove their underwear so PT can examine their perineum. The patient is also given the option of providing their own chaperone as one is not provided in our facility. The patient also has the right and is explained the right to defer or refuse any part of the evaluation or treatment including the internal exam. With the patient's consent, PT will use one gloved finger to gently assess the muscles of the pelvic floor, seeing how well it contracts and relaxes and if there is muscle symmetry. After, the patient will get dressed and PT and patient will discuss exam findings and plan of care. PT and patient discuss plan of care, schedule, attendance policy and HEP activities.  Going through vaginally working on the sides of the introitus. Along the levator ani, sides of the bladder and posterior vaginal canal Neuromuscular re-education: Pelvic floor contraction training: Therapist finger in the vaginal canal working on 10 second contraction and quick contractions    03/31/23 Manual: Soft tissue mobilization: Manual work to the abdomen and diaphragm to improve tissue mobility Circular massage to the abdomen to improve peristalic motion of the intestines.  Fascial release of the lower abdomen to improve tissue mobility around the bladder Exercises: Strengthening: Nustep for 8 minutes at level 5 while assessing patient supine ball squeeze with pelvic floor contraction hold 5 sec 15 X Supine clamshell with red band and pelvic floor contraction 15 x each leg Laying on side hip internal rotation 15 x on each  side Standing alternate shoulder and hip lifting against floor.   PATIENT EDUCATION:  03/05/23 Education details: Access Code: [email or web address], educated patient to lay on her back when she feels her rectal prolapse to reset and reduce her pain, urge to void Person educated: Patient Education method: Explanation, Demonstration, Tactile cues, Verbal cues, and Handouts Education comprehension: verbalized understanding, returned demonstration, verbal cues required, tactile cues required, and needs further education  HOME EXERCISE PROGRAM: 03/05/23 Access Code: [email or web address] URL: [email or web address] Date: 03/05/2023 Prepared by: Eulis Foster  Program Notes sit on ball and massage the pelvic floor.   Exercises - Supine Pelvic Floor Contraction  - 3 x daily - 7 x weekly - 1 sets - 5 reps - 10 sec hold - Supine Pelvic Floor Stretch  - 1 x daily - 7 x weekly - 1 sets - 1 reps - 30 sec hold - Supine Figure 4 Piriformis Stretch with Leg Extension  - 1 x daily - 7 x weekly - 1 sets - 1 reps - 30 sec hold - Supine Hip Adduction Isometric with Ball  - 1 x daily - 7 x weekly - 1 sets - 10 reps - Supine Bridge with Mini Swiss Ball Between Knees  - 1 x daily - 7 x weekly -  1 sets - 10 reps - Hooklying Clamshell with Resistance  - 1 x daily - 7 x weekly - 1 sets - 10 reps Access Code: [email or web address] URL: [email or web address] Date: 02/26/2023 Prepared by: Eulis Foster  Program Notes sit on ball and massage the pelvic floor.   Exercises - Supine Pelvic Floor Contraction  - 3 x daily - 7 x weekly - 1 sets - 5 reps - 10 sec hold - Supine Pelvic Floor Stretch  - 1 x daily - 7 x weekly - 1 sets - 1 reps - 30 sec hold - Supine Figure 4 Piriformis Stretch with Leg Extension  - 1 x daily - 7 x weekly - 1 sets - 1 reps - 30 sec hold  ASSESSMENT:  CLINICAL IMPRESSION: Patient is a 72 y.o. female who was seen today for physical therapy  treatment for bladder pain. Pelvic floor strength is 3/5  holding for 10 sec and able to quick contraction.  Rectal and vaginal pain is now 3/10 compared to 6/10. Patient is learning how to deal with her rectal prolapse and working on the consistency of the stool. She has not had urinary leakage this week. She gets up 5 times at night and sometimes more.  Patient has not strained to urinate this week. Patient leaks urine with urge to void, walking to the bathroom, coughing, sneezing, laughing, and exercise. Patient rectal prolapse will come out with urination or any pressure in the pelvic floor. Not able to assess the rectal strength due to her not feeling well in the rectal area. Patient continues to have rectal bleeding but not every day. Vaginal strength in supine is 3/5 and standing is 2/5 with verbal and tactile cues. Patient will benefit from skilled therapy to work on pelvic floor strength and reduction of pain to improve urination and bowel movements.   OBJECTIVE IMPAIRMENTS: decreased coordination, decreased strength, increased fascial restrictions, increased muscle spasms, and pain.   ACTIVITY LIMITATIONS: sitting, standing, continence, toileting, and locomotion level  PARTICIPATION LIMITATIONS: meal prep, cleaning, laundry, shopping, and community activity  PERSONAL FACTORS: Age, Fitness, Time since onset of injury/illness/exacerbation, and 1-2 comorbidities: Stage III kidney disease; Hemorrhoid surgery 1976; Inguinal hernia repair; IBS; IC; OCD; Osteopenia; aterior and posterior repair with sacrospinous fixation 2023; bladder suspension 2023  are also affecting patient's functional outcome.   REHAB POTENTIAL: Good  CLINICAL DECISION MAKING: Evolving/moderate complexity  EVALUATION COMPLEXITY: Moderate   GOALS: Goals reviewed with patient? Yes  SHORT TERM GOALS: Target date: 03/18/23  Patient independent with initial HEP for pelvic floor and core strength.  Baseline: Goal status: Met 03/05/23  2.  Patient is independent with flexibility  exercises to relax the pelvic floor muscles.  Baseline:  Goal status: Met 03/12/23  3.  Patient is educated on ways to manage her rectal prolapse.  Baseline:  Goal status: Met 04/07/23  4.  Patient is educated on ways to manage her pelvic pain.  Baseline:  Goal status: Met 03/12/23   LONG TERM GOALS: Target date: 06/11/23  Patient is independent with advanced HEP for core and pelvic floor to reduce leakage.  Baseline:  Goal status: ongoing 04/14/23  2.  Patient is able to have a bowel movement or urinate with pain level </= 1-2/10 due to the ability to relax her muscles and use breath.  Baseline: pain level is 3-4/10 due to spasms Goal status: ongoing 04/14/23  3.  Patient is able to urinate </= 2 times per night due to reduction of the urge  to void.  Baseline: gets up 5 times at night and sometimes more, every 2 hours Goal status: ongoing 04/14/23  4.  Patient reports her urinary leakage decreased >/= 75% due to improved pelvic floor strength >/= 3/5 and reduction of trigger points.  Baseline: dribble instead of full bladder release Goal status: ongoing 04/07/23  5.  Patient is able to urinate with straining </= 75% due to improve relaxation of pelvic floor.  Baseline: no straining to urinate this week Goal status: ongoing 04/14/23   PLAN:  PT FREQUENCY: 1x/week  PT DURATION: 8 weeks  PLANNED INTERVENTIONS: 97110-Therapeutic exercises, 97530- Therapeutic activity, 97112- Neuromuscular re-education, 97140- Manual therapy, 97014- Electrical stimulation (unattended), 97035- Ultrasound, Patient/Family education, Dry Needling, Cryotherapy, Moist heat, and Biofeedback  PLAN FOR NEXT SESSION: work pelvic floor contraction in standing  Eulis Foster, PT 04/14/23 11:50 AM

## 2023-04-15 ENCOUNTER — Ambulatory Visit (INDEPENDENT_AMBULATORY_CARE_PROVIDER_SITE_OTHER): Payer: PPO | Admitting: Nurse Practitioner

## 2023-04-15 ENCOUNTER — Encounter: Payer: Self-pay | Admitting: Nurse Practitioner

## 2023-04-15 VITALS — BP 104/70 | HR 73 | Temp 97.9°F | Ht 63.0 in | Wt 179.4 lb

## 2023-04-15 DIAGNOSIS — I5030 Unspecified diastolic (congestive) heart failure: Secondary | ICD-10-CM | POA: Diagnosis not present

## 2023-04-15 DIAGNOSIS — R7989 Other specified abnormal findings of blood chemistry: Secondary | ICD-10-CM

## 2023-04-15 DIAGNOSIS — F3342 Major depressive disorder, recurrent, in full remission: Secondary | ICD-10-CM | POA: Diagnosis not present

## 2023-04-15 DIAGNOSIS — R600 Localized edema: Secondary | ICD-10-CM | POA: Diagnosis not present

## 2023-04-15 DIAGNOSIS — F5102 Adjustment insomnia: Secondary | ICD-10-CM | POA: Diagnosis not present

## 2023-04-15 DIAGNOSIS — I10 Essential (primary) hypertension: Secondary | ICD-10-CM | POA: Diagnosis not present

## 2023-04-15 DIAGNOSIS — Z79899 Other long term (current) drug therapy: Secondary | ICD-10-CM | POA: Diagnosis not present

## 2023-04-15 LAB — TSH: TSH: 27.12 m[IU]/L — ABNORMAL HIGH (ref 0.40–4.50)

## 2023-04-15 MED ORDER — LEVOTHYROXINE SODIUM 50 MCG PO TABS: 50.0000 ug | ORAL_TABLET | Freq: Every day | ORAL | 11 refills | Status: DC

## 2023-04-15 MED ORDER — TRAZODONE HCL 50 MG PO TABS: 50.0000 mg | ORAL_TABLET | Freq: Every day | ORAL | 1 refills | Status: DC

## 2023-04-15 MED ORDER — FUROSEMIDE 40 MG PO TABS: 40.0000 mg | ORAL_TABLET | Freq: Every day | ORAL | 2 refills | Status: DC | PRN

## 2023-04-15 NOTE — Progress Notes (Signed)
Assessment and Plan:  [name] was seen today for an episodic visit.  Diagnoses and all order for this visit:  Diastolic congestive heart failure, unspecified HF chronicity (HCC) (Primary)/BLE edema Possible secondary increase in fluid retention related to elevated TSH levels.  Continue Furosemide 40 mg daily x 3-5 days until swelling reduced,then take 20 mg PRN to control any increase in fluid retention. Elevated feet/compression stockings.  Disease process and medications discussed.  Emphasized salt restriction, less than 2000mg  a day. Encouraged daily monitoring of the patient's weight, call office if 5 lb weight loss or gain in a day.  Encouraged regular exercise. Monitor Blood Pressure - assess for any increase in dizziness, syncope. If any increasing shortness of breath, swelling, or chest pressure go to ER immediately.    - furosemide (LASIX) 40 MG tablet; Take 1 tablet (40 mg total) by mouth daily as needed.  Dispense: 90 tablet; Refill: 2  Essential hypertension Discussed DASH (Dietary Approaches to Stop Hypertension) DASH diet is lower in sodium than a typical American diet. Cut back on foods that are high in saturated fat, cholesterol, and trans fats. Eat more whole-grain foods, fish, poultry, and nuts Remain active and exercise as tolerated daily.   Elevated TSH Increase Levothyroxine dose to 150 mcg daily to continue reduction to TSH levels not responding to daily dose of Levothyroxine 100 mcg . Reminded to take on an empty stomach 30-57mins before food.  Stop any Biotin Supplement 48-72 hours before next TSH level to reduce the risk of falsely low TSH levels. Continue to monitor.    - levothyroxine (SYNTHROID) 50 MCG tablet; Take 1 tablet (50 mcg total) by mouth daily.  Dispense: 30 tablet; Refill: 11  Major depression, recurrent, full remission (HCC) Wean Amitriptyline d/t SE of "bad dreams." Start Trazodone 50 mg HS Reviewed relaxation techniques.  Sleep  hygiene. Recommended Cognitive Behavioral Therapy (CBT). Encouraged personality growth wand development through coping techniques and problem-solving skills. Limit/Decrease/Monitor any drug/alcohol intake.    Adjustment insomnia Start Trazodone as directed. Discussed good sleep hygiene. Establish bed and wake times. Sleep restriction-only sleep estimated hrs sleep. Bed only for sleep, only sleep when sleepy, out of bed if anxious (stimulus control). Reviewed relaxation techniques, mindful meditations. Expected sleep duration. Addressed worries about not sleeping.   - traZODone (DESYREL) 50 MG tablet; Take 1 tablet (50 mg total) by mouth at bedtime.  Dispense: 30 tablet; Refill: 1  Medication management All medications discussed and reviewed in full. All questions and concerns regarding medications addressed.    Notify office for further evaluation and treatment, questions or concerns if s/s fail to improve. The risks and benefits of my recommendations, as well as other treatment options were discussed with the patient today. Questions were answered.  Further disposition pending results of labs. Discussed med's effects and SE's.    Over 30 minutes of exam, counseling, chart review, and critical decision making was performed.   Future Appointments  Date Time Provider Department Center  04/19/2023 12:30 PM Theressa Millard, PT [facility] None  04/26/2023  3:30 PM Theressa Millard, PT [facility] None  04/27/2023 11:40 AM Marguerita Beards, MD [facility]  05/17/2023  4:15 PM Theressa Millard, PT [facility] None  05/24/2023  4:15 PM Theressa Millard, PT [facility] None  06/23/2023 10:15 AM Theressa Millard, PT [facility] None  06/24/2023 10:00 AM Adela Glimpse, NP [facility] None  06/30/2023 11:45 AM Theressa Millard, PT [facility] None  09/22/2023 10:30 AM Adela Glimpse, NP  [facility] None     ------------------------------------------------------------------------------------------------------------------   HPI BP 104/70   Pulse 73   Temp 97.9 F (36.6 C)   Ht 5\' 3"  (1.6 m)   Wt 179 lb 6.4 oz (81.4 kg)   SpO2 98%   BMI 31.78 kg/m   72 y.o.female presents for evaluation of BLE edema.  She does have a hx of diastolic CHF that has been well controlled over the past several years. Followed with Cardiology in the past, but not recent as well controlled. However, over the last month she has noticed increase in BLE edema.  Denies crackles, noticed some wheezing.  She has an older Rx of Lasix at home to which she has been taking 20 mg with minimal benefit.    Of note her thyroid level (TSH) has been elevated over the last month.  She reports medication compliance with Synthroid 100 mcg daily.    She has been experiencing interstitial cystitis and started treatment with amitriptyline, she felt this could be a contributor to the swelling.  Shares that amitriptyline induces bad dreams and she has fallen out of the bed twice feeling as though she is "working" in her sleep.  She also shares that has gets up several times throughout the night due to the symptoms of the interstitial cystitis.    She has also been diagnosed with a rectal prolapse.  She feels as though the stress has contributed to her elevation in thyroid hormone.  She does follow with UroGyn.    Past Medical History:  Diagnosis Date   Chronic diastolic CHF (congestive heart failure) (HCC)    cardriologist--- dr Lacretia Nicks. Flora Lipps;   dx in setting sepsis 11/ 2020;   echo in epic 03-10-2019 ef 55-60%, G1DD, mild to moderate TR no stenosis and normal RASP;  CTA 01-31-2021 epic   Chronic kidney disease (CKD), stage III (moderate) (HCC)    nephrologist--- dr Isa Rankin   Depression    Diverticulosis of colon    Emphysema of lung (HCC)    no home oxygen   GERD (gastroesophageal reflux disease)    History of acute pyelonephritis     w/ hx sepsis   History of Helicobacter pylori infection 2012   History of recurrent UTIs    History of TIA (transient ischemic attack)    TIAs   Hyperlipidemia, mixed    Hypertension    followed by pcp and nephrologist----  (nuclear stress test in epic 06-28-2015 low risk no ischemia, nuclear ef 60%) no meds   Hypothyroidism    IBS (irritable bowel syndrome)    IC (interstitial cystitis)    remote hx   Inguinal hernia, bilateral    Irritable bowel syndrome with constipation and diarrhea    LBBB (left bundle branch block) 2015   cardiologist --- dr Flora Lipps;  known chronic LBBB for several yrs   OCD (obsessive compulsive disorder)    OSA (obstructive sleep apnea)    no CPAP intolerant   Osteopenia    Prolapsed hemorrhoids    Seasonal allergies    Vitamin D deficiency      Allergies  Allergen Reactions   Ace Inhibitors Other (See Comments)    Acute renal failure (lisinopril)   Cephalexin Anaphylaxis   Dilaudid [Hydromorphone Hcl] Anaphylaxis   Robaxin [Methocarbamol] Anaphylaxis   Macrobid [Nitrofurantoin] Other (See Comments)    Causes hyperthermia    Levaquin [Levofloxacin] Nausea And Vomiting   Ciprofloxacin Rash   Codeine Nausea Only   Latex Rash   Sulfa Antibiotics  Other (See Comments)    Do not take regularly per nephrology.  Can use for acute issues.     Current Outpatient Medications on File Prior to Visit  Medication Sig   aspirin EC 81 MG tablet Take 81 mg by mouth daily.    buPROPion (WELLBUTRIN XL) 150 MG 24 hr tablet Take 3 tablets (450 mg total) by mouth daily for depression.   cetirizine (ZYRTEC) 10 MG tablet Take 10 mg by mouth daily.   Cholecalciferol (VITAMIN D-3) 5000 UNITS TABS Take 5,000 Units by mouth daily.    diazepam (VALIUM) 5 MG tablet Place 1 tablet vaginally nightly as needed for muscle spasm/ pelvic pain.   estradiol (ESTRACE) 0.1 MG/GM vaginal cream PLACE 1/2 GRAM VAGINALLY EVERY NIGHT AT BEDTIME FOR 2 WEEKS THEN USE 2 TIMES PER WEEK  THEREAFTER   gabapentin (NEURONTIN) 600 MG tablet Take 1/2 to 1 tablet 2 to 3 x /Daily as needed for Pain   guaifenesin (HUMIBID E) 400 MG TABS tablet Take 400 mg by mouth daily.   hydrOXYzine (ATARAX) 25 MG tablet Take 1 tablet (25 mg total) by mouth every 8 (eight) hours as needed for anxiety (or bladder pain).   hyoscyamine (LEVSIN SL) 0.125 MG SL tablet DISSOLVE 1 TABLET UNDER THE TONGUE EVERY 4 HOURS IF NEEDED FOR NAUSEA, BLOATING, CRAMPING, OR DIARRHEA   ipratropium-albuterol (DUONEB) 0.5-2.5 (3) MG/3ML SOLN USE 1 VIAL IN NEBULIZER EVERY 4 HOURS AS NEEDED **MAX 6 DOSES A DAY**   levothyroxine (SYNTHROID) 100 MCG tablet Take  1 tablet  Daily  on an empty stomach with only water for 30 minutes & no Antacid meds, Calcium or Magnesium for 4 hours & avoid Biotin   lidocaine-prilocaine (EMLA) cream Apply 1 Application topically as needed.   ondansetron (ZOFRAN) 8 MG tablet TAKE 1 TABLET BY MOUTH 3 TIMES A DAY AS NEEDED FOR NAUSEA (Patient taking differently: Take 8 mg by mouth every 8 (eight) hours as needed. TAKE 1 TABLET BY MOUTH 3 TIMES A DAY AS NEEDED FOR NAUSEA)   pantoprazole (PROTONIX) 20 MG tablet TAKE 1 TABLET BY MOUTH DAILY   rosuvastatin (CRESTOR) 5 MG tablet Take  1 tablet 3 x /week for Cholesterol                                                         /                    TAKE                                     BY                                  MOUTH   atenolol (TENORMIN) 50 MG tablet Take 50 mg by mouth daily. (Patient not taking: Reported on 04/15/2023)   Cinnamon 500 MG capsule Take 500 mg by mouth daily. (Patient not taking: Reported on 04/15/2023)   ferrous sulfate 325 (65 FE) MG EC tablet Take 1 tablet (325 mg total) by mouth daily with breakfast. (Patient not taking: Reported on 04/15/2023)   HYDROcodone-acetaminophen (NORCO/VICODIN) 5-325 MG tablet Take 1 tablet by  mouth every 4 (four) hours as needed. (Patient not taking: Reported on 04/15/2023)   No current  facility-administered medications on file prior to visit.    ROS: all negative except what is noted in the HPI.   Physical Exam:  BP 104/70   Pulse 73   Temp 97.9 F (36.6 C)   Ht 5\' 3"  (1.6 m)   Wt 179 lb 6.4 oz (81.4 kg)   SpO2 98%   BMI 31.78 kg/m   General Appearance: NAD.  Awake, conversant and cooperative. Eyes: PERRLA, EOMs intact.  Sclera white.  Conjunctiva without erythema. Sinuses: No frontal/maxillary tenderness.  No nasal discharge. Nares patent.  ENT/Mouth: Ext aud canals clear.  Bilateral TMs w/DOL and without erythema or bulging. Hearing intact.  Posterior pharynx without swelling or exudate.  Tonsils without swelling or erythema.  Neck: Supple.  No masses, nodules or thyromegaly. Respiratory: Effort is regular with non-labored breathing. Breath sounds are equal bilaterally without rales, rhonchi, wheezing or stridor.  Cardio: RRR with no MRGs. Brisk peripheral pulses without edema.  Abdomen: Active BS in all four quadrants.  Soft and non-tender without guarding, rebound tenderness, hernias or masses. Lymphatics: Non tender without lymphadenopathy.  Musculoskeletal: Full ROM, 5/5 strength, normal ambulation.  No clubbing or cyanosis. Skin: Appropriate color for ethnicity. Warm without rashes, lesions, ecchymosis, ulcers.  Neuro: CN II-XII grossly normal. Normal muscle tone without cerebellar symptoms and intact sensation.   Psych: AO X 3,  appropriate mood and affect, insight and judgment.     Adela Glimpse, NP 3:23 PM [facility]

## 2023-04-15 NOTE — Patient Instructions (Signed)
Trazodone Tablets What is this medication? TRAZODONE (TRAZ oh done) treats depression. It increases the amount of serotonin in the brain, a hormone that helps regulate mood. This medicine may be used for other purposes; ask your health care provider or pharmacist if you have questions. COMMON BRAND NAME(S): Desyrel What should I tell my care team before I take this medication? They need to know if you have any of these conditions: Bipolar disorder Bleeding disorder Glaucoma Heart disease, or previous heart attack Irregular heartbeat or rhythm Kidney disease Liver disease Low levels of sodium in the blood Suicidal thoughts, plans, or attempt by you or a family member An unusual or allergic reaction to trazodone, other medications, foods, dyes, or preservatives Pregnant or trying to get pregnant Breastfeeding How should I use this medication? Take this medication by mouth with a glass of water. Take it as directed on the prescription label at the same time every day. Take this medication shortly after a meal or a light snack. Keep taking this medication unless your care team tells you to stop. Stopping it too quickly can cause serious side effects. It can also make your condition worse. A special MedGuide will be given to you by the pharmacist with each prescription and refill. Be sure to read this information carefully each time. Talk to your care team about the use of this medication in children. Special care may be needed. Overdosage: If you think you have taken too much of this medicine contact a poison control center or emergency room at once. NOTE: This medicine is only for you. Do not share this medicine with others. What if I miss a dose? If you miss a dose, take it as soon as you can. If it is almost time for your next dose, take only that dose. Do not take double or extra doses. What may interact with this medication? Do not take this medication with any of the following: Certain  medications for fungal infections, such as fluconazole, itraconazole, ketoconazole, posaconazole, voriconazole Cisapride Dronedarone Linezolid MAOIs, such as Carbex, Eldepryl, Marplan, Nardil, and Parnate Mesoridazine Methylene blue (injected into a vein) Pimozide Saquinavir Thioridazine This medication may also interact with the following: Alcohol Antiviral medications for HIV or AIDS Aspirin and aspirin-like medications Barbiturates, such as phenobarbital Certain medications for blood pressure, heart disease, irregular heart beat Certain medications for mental health conditions Certain medications for migraine headache, such as almotriptan, eletriptan, frovatriptan, naratriptan, rizatriptan, sumatriptan, zolmitriptan Certain medications for seizures, such as carbamazepine and phenytoin Certain medications for sleep Certain medications that treat or prevent blood clots, such as dalteparin, enoxaparin, warfarin Digoxin Fentanyl Lithium NSAIDS, medications for pain and inflammation, such as ibuprofen or naproxen Other medications that cause heart rhythm changes Rasagiline Supplements, such as St. John's wort, kava kava, valerian Tramadol Tryptophan This list may not describe all possible interactions. Give your health care provider a list of all the medicines, herbs, non-prescription drugs, or dietary supplements you use. Also tell them if you smoke, drink alcohol, or use illegal drugs. Some items may interact with your medicine. What should I watch for while using this medication? Visit your care team for regular checks on your progress. Tell your care team if your symptoms do not start to get better or if they get worse. Because it may take several weeks to see the full effects of this medication, it is important to continue your treatment as prescribed by your care team. Watch for new or worsening thoughts of suicide or depression. This   includes sudden changes in mood, behaviors,  or thoughts. These changes can happen at any time but are more common in the beginning of treatment or after a change in dose. Call your care team right away if you experience these thoughts or worsening depression. This medication may cause mood and behavior changes, such as anxiety, nervousness, irritability, hostility, restlessness, excitability, hyperactivity, or trouble sleeping. These changes can happen at any time but are more common in the beginning of treatment or after a change in dose. Call your care team right away if you notice any of these symptoms. This medication may affect your coordination, reaction time, or judgment. Do not drive or operate machinery until you know how this medication affects you. Sit up or stand slowly to reduce the risk of dizzy or fainting spells. Drinking alcohol with this medication can increase the risk of these side effects. This medication may cause dry eyes and blurred vision. If you wear contact lenses you may feel some discomfort. Lubricating drops may help. See your care team if the problem does not go away or is severe. Your mouth may get dry. Chewing sugarless gum or sucking hard candy and drinking plenty of water may help. Contact your care team if the problem does not go away or is severe. What side effects may I notice from receiving this medication? Side effects that you should report to your care team as soon as possible: Allergic reactions--skin rash, itching, hives, swelling of the face, lips, tongue, or throat Bleeding--bloody or Theroux, tar-like stools, red or dark brown urine, vomiting blood or brown material that looks like coffee grounds, small, red or purple spots on skin, unusual bleeding or bruising Heart rhythm changes--fast or irregular heartbeat, dizziness, feeling faint or lightheaded, chest pain, trouble breathing Low blood pressure--dizziness, feeling faint or lightheaded, blurry vision Low sodium level--muscle weakness, fatigue,  dizziness, headache, confusion Prolonged or painful erection Serotonin syndrome--irritability, confusion, fast or irregular heartbeat, muscle stiffness, twitching muscles, sweating, high fever, seizures, chills, vomiting, diarrhea Sudden eye pain or change in vision such as blurry vision, seeing halos around lights, vision loss Thoughts of suicide or self-harm, worsening mood, feelings of depression Side effects that usually do not require medical attention (report to your care team if they continue or are bothersome): Change in sex drive or performance Constipation Dizziness Drowsiness Dry mouth This list may not describe all possible side effects. Call your doctor for medical advice about side effects. You may report side effects to FDA at 1-800-FDA-1088. Where should I keep my medication? Keep out of the reach of children and pets. Store at room temperature between 15 and 30 degrees C (59 to 86 degrees F). Protect from light. Keep container tightly closed. Throw away any unused medication after the expiration date. NOTE: This sheet is a summary. It may not cover all possible information. If you have questions about this medicine, talk to your doctor, pharmacist, or health care provider.  2024 Elsevier/Gold Standard (2022-04-09 00:00:00)  

## 2023-04-19 ENCOUNTER — Ambulatory Visit: Payer: PPO | Admitting: Physical Therapy

## 2023-04-19 ENCOUNTER — Telehealth: Payer: Self-pay | Admitting: Physical Therapy

## 2023-04-19 NOTE — Telephone Encounter (Signed)
Called patient about her missed appointment today at 12:30. Left a message.  Eulis Foster, PT @12 /23/24@ 12:50 PM

## 2023-04-22 ENCOUNTER — Telehealth: Payer: Self-pay | Admitting: Physical Therapy

## 2023-04-22 NOTE — Telephone Encounter (Signed)
Returned patient phone call. And left a message.  Eulis Foster, PT @12 /26/24@ 2:05 PM

## 2023-04-26 ENCOUNTER — Ambulatory Visit: Payer: PPO | Admitting: Physical Therapy

## 2023-04-27 ENCOUNTER — Ambulatory Visit: Payer: PPO | Admitting: Obstetrics and Gynecology

## 2023-04-29 ENCOUNTER — Encounter: Payer: Self-pay | Admitting: Obstetrics and Gynecology

## 2023-04-29 ENCOUNTER — Telehealth: Payer: Self-pay | Admitting: Cardiovascular Disease

## 2023-04-29 DIAGNOSIS — K623 Rectal prolapse: Secondary | ICD-10-CM | POA: Insufficient documentation

## 2023-04-29 NOTE — Telephone Encounter (Signed)
 Called patient back about message. Patient complaining of LE edema. Patient stated that herPRN lasix  is not helping. Patient has chronic SOB, but stated it gets worse when her legs are swollen. Patient is trying to follow-up with her nephrologist as well, because this might be more of a kidney issue. Encouraged patient to elevate her legs and reduce salt intake. Made patient an appointment with Dr. Delford on DOD day, since it is the first available. Will forward to Dr. Nishan for further advisement.

## 2023-04-29 NOTE — Telephone Encounter (Signed)
 Pt c/o swelling/edema: STAT if pt has developed SOB within 24 hours  If swelling, where is the swelling located?   Feet, ankles and legs  How much weight have you gained and in what time span?   Yes  Have you gained 2 pounds in a day or 5 pounds in a week?   Yes  Do you have a log of your daily weights (if so, list)?   Jan 2 - 174.2 Jan 1 - Not done Dec 26 - Not done Dec 25 - 172.4 Dec 24 - 172.4 Dec 23 - Not done Dec 15 - 177.8  Are you currently taking a fluid pill?   No  Are you currently SOB?   Yes  Have you traveled recently in a car or plane for an extended period of time?  No  Patient stated she is concerned she has been having swelling in her legs, feet and ankles and wants to know next steps.

## 2023-04-30 ENCOUNTER — Telehealth: Payer: Self-pay | Admitting: Cardiovascular Disease

## 2023-04-30 NOTE — Telephone Encounter (Signed)
   Pre-operative Risk Assessment    Patient Name: [name]  DOB: 12/17/1950 MRN: [id number]   Date of last office visit: 12/07/19 Date of next office visit: 05/05/23   Request for Surgical Clearance    Procedure:   Robotic rectopexy  Date of Surgery:  Clearance TBD                                Surgeon:  Dr. LOISE. [place] Sur[facility] Group or Practice Name:  [facility]  Phone number:  [phone] Fax number:  [phone]   Type of Clearance Requested:   - Medical    Type of Anesthesia:  General    Additional requests/questions:      [name]   04/30/2023, 8:35 AM

## 2023-04-30 NOTE — Progress Notes (Signed)
 CARDIOLOGY CONSULT NOTE       Patient ID: [name] MRN: [id number] DOB/AGE: [birth year] 72 y.o.  Referring Physician: [facility]  Primary Physician: Tonita Fallow, MD Primary Cardiologist: [facility] Reason for Consultation: Peripheral edema   HPI:  73 y.o. added on to DOD schedule. I have never seen [name] seen by Dr Mona and Burton 12/07/19. Seen in 2017 by Dr Maranda for preoperative clearance. TTE EF 55-60% Trivial AR/MR normal PA pressures Normal myovue EF 60% no ischemia. Prior smoker with chest CT showing emphysema Quit smoking in 2004  She has chronic LBBB. She has CRF with Cr baseline in 1.7 range. Seen by Hilty/Oneal 11/2019 for ? Diastolic CHF after breast implant rupture. Sepsis with volume resuscitation.   Has had increasing LE edema [name] few weeks and Lasix  not effective TSH has been elevated 37-> 27 despite increase in synthroid  [name] Cr 04/05/23 1.67 with K 4.9 and BUN 31 Albumin 3.2 Hct 34 She also has been bothered by rectal prolapse   This is not CHF. She has varicose veins marked in right ankle/foot and mild lymphedema. She needs rectal prolapse surgery. Discussed updating her TTEPlus   ROS All other systems reviewed and negative except as noted above  Past Medical History:  Diagnosis Date   Chronic diastolic CHF (congestive heart failure) (HCC)    cardriologist--- dr lelon. barbaraann;   dx in setting sepsis 11/ 2020;   echo in epic 03-10-2019 ef 55-60%, G1DD, mild to moderate TR no stenosis and normal RASP;  CTA 01-31-2021 epic   Chronic kidney disease (CKD), stage III (moderate) (HCC)    nephrologist--- dr val   Depression    Diverticulosis of colon    Emphysema of lung (HCC)    no home oxygen   GERD (gastroesophageal reflux disease)    History of acute pyelonephritis    w/ hx sepsis   History of Helicobacter pylori infection 2012   History of recurrent UTIs    History of TIA (transient ischemic attack)    TIAs   Hyperlipidemia, mixed     Hypertension    followed by pcp and nephrologist----  (nuclear stress test in epic 06-28-2015 low risk no ischemia, nuclear ef 60%) no meds   Hypothyroidism    IBS (irritable bowel syndrome)    IC (interstitial cystitis)    remote hx   Inguinal hernia, bilateral    Irritable bowel syndrome with constipation and diarrhea    LBBB (left bundle branch block) 2015   cardiologist --- dr barbaraann;  known chronic LBBB for several yrs   OCD (obsessive compulsive disorder)    OSA (obstructive sleep apnea)    no CPAP intolerant   Osteopenia    Prolapsed hemorrhoids    Seasonal allergies    Vitamin D  deficiency     Family History  Problem Relation Age of Onset   Colon polyps Mother    Muscular dystrophy Mother        late 1s   Hypertension Sister    Muscular dystrophy Maternal Grandmother        50   Heart block Maternal Grandmother    Colon cancer Neg Hx    Esophageal cancer Neg Hx    Rectal cancer Neg Hx    Stomach cancer Neg Hx     Social History   Socioeconomic History   Marital status: Divorced    Spouse name: Not on file   Number of children: Not on file   Years of education: Not  on file   Highest education level: Not on file  Occupational History   Not on file  Tobacco Use   Smoking status: Former    Current packs/day: 0.00    Average packs/day: 1 pack/day for 40.0 years (40.0 ttl pk-yrs)    Types: Cigarettes    Start date: 23    Quit date: 2004    Years since quitting: 21.0   Smokeless tobacco: Never  Vaping Use   Vaping status: Never Used  Substance and Sexual Activity   Alcohol use: Yes    Comment: rare   Drug use: Never   Sexual activity: Not Currently    Birth control/protection: Post-menopausal  Other Topics Concern   Not on file  Social History Narrative   Not on file   Social Drivers of Health   Financial Resource Strain: Medium Risk (04/28/2023)   Received from [facility]   Overall Financial Resource Strain (CARDIA)    Difficulty of Paying  Living Expenses: Somewhat hard  Food Insecurity: No Food Insecurity (04/28/2023)   Received from [facility]   Hunger Vital Sign    Worried About Running Out of Food in the [name] Year: Never true    Ran Out of Food in the [name] Year: Never true  Transportation Needs: No Transportation Needs (04/28/2023)   Received from [facility] - Transportation    Lack of Transportation (Medical): No    Lack of Transportation (Non-Medical): No  Physical Activity: Insufficiently Active (04/28/2023)   Received from [facility]   Exercise Vital Sign    Days of Exercise per Week: 2 days    Minutes of Exercise per Session: 20 min  Stress: Stress Concern Present (04/28/2023)   Received [facility] - Occupational Stress Questionnaire    Feeling of Stress : To some extent  Social Connections: Moderately Integrated (04/28/2023)   Received from [facility]   Social Network    How would you rate your social network (family, work, friends)?: Adequate participation with social networks  Intimate Partner Violence: Patient Declined (04/28/2023)   Received from [facility]   HITS    Over the [name] 12 months how often did your partner physically hurt you?: Patient declined    Over the [name] 12 months how often did your partner insult you or talk down to you?: Patient declined    Over the [name] 12 months how often did your partner threaten you with physical harm?: Patient declined    Over the [name] 12 months how often did your partner scream or curse at you?: Patient declined    Past Surgical History:  Procedure Laterality Date   ANTERIOR AND POSTERIOR REPAIR WITH SACROSPINOUS FIXATION N/A 11/10/2021   Procedure: POSTERIOR REPAIR WITH SACROSPINOUS FIXATION;  Surgeon: Marilynne Rosaline SAILOR, MD;  Location: [facility];  Service: Gynecology;  Laterality: N/A;  total time requested is 1.5 hrs   BLADDER SUSPENSION N/A 11/10/2021   Procedure:  TRANSVAGINAL TAPE (TVT) PROCEDURE;  Surgeon: Marilynne Rosaline SAILOR, MD;  Location: [facility];  Service: Gynecology;  Laterality: N/A;   BREAST ENHANCEMENT SURGERY Bilateral    1970s   BREAST IMPLANT REMOVAL Bilateral 03/07/2019   BUNIONECTOMY Bilateral 1997   CATARACT EXTRACTION W/ INTRAOCULAR LENS IMPLANT Right 2005   COLONOSCOPY  10/2020   CYSTOSCOPY WITH FULGERATION  11/10/2021   Procedure: CYSTOSCOPY WITH FULGERATION;  Surgeon: Marilynne Rosaline SAILOR, MD;  Location: [facility];  Service: Gynecology;;   ESOPHAGOGASTRODUODENOSCOPY (EGD) WITH PROPOFOL  N/A 03/12/2019   Procedure: ESOPHAGOGASTRODUODENOSCOPY (EGD) WITH PROPOFOL ;  Surgeon: Rollin Dover, MD;  Location: [facility] ENDOSCOPY;  Service: Endoscopy;  Laterality: N/A;   HEMORRHOID SURGERY  1976   INGUINAL HERNIA REPAIR N/A 04/10/2021   Procedure: LAPAROSCOPIC LEFT INGUINAL HERNIA REPAIR AND RIGHT INGUINAL HERNII REPAIR WITH TAP BLOCK;  Surgeon: Sheldon Standing, MD;  Location: [facility];  Service: General;  Laterality: N/A;   ORIF ANKLE FRACTURE Left 08/30/2017   Procedure: OPEN REDUCTION INTERNAL FIXATION (ORIF) ANKLE FRACTURE;  Surgeon: Fidel Rogue, MD;  Location: [facility] OR;  Service: Orthopedics;  Laterality: Left;   TONSILLECTOMY     CHILD      Current Outpatient Medications:    aspirin  EC 81 MG tablet, Take 81 mg by mouth daily. , Disp: , Rfl:    buPROPion  (WELLBUTRIN  XL) 150 MG 24 hr tablet, Take 3 tablets (450 mg total) by mouth daily for depression., Disp: 270 tablet, Rfl: 3   cetirizine (ZYRTEC) 10 MG tablet, Take 10 mg by mouth daily., Disp: , Rfl:    Cholecalciferol (VITAMIN D -3) 5000 UNITS TABS, Take 5,000 Units by mouth daily. , Disp: , Rfl:    diazepam  (VALIUM ) 5 MG tablet, Place 1 tablet vaginally nightly as needed for muscle spasm/ pelvic pain., Disp: 30 tablet, Rfl: 1   estradiol  (ESTRACE ) 0.1 MG/GM vaginal cream, PLACE 1/2 GRAM VAGINALLY EVERY NIGHT AT BEDTIME FOR 2 WEEKS THEN  USE 2 TIMES PER WEEK THEREAFTER, Disp: 42.5 g, Rfl: 11   gabapentin  (NEURONTIN ) 600 MG tablet, Take 1/2 to 1 tablet 2 to 3 x /Daily as needed for Pain, Disp: 270 tablet, Rfl: 1   guaifenesin  (HUMIBID E) 400 MG TABS tablet, Take 400 mg by mouth daily., Disp: , Rfl:    HYDROcodone -acetaminophen  (NORCO/VICODIN) 5-325 MG tablet, Take 1 tablet by mouth every 4 (four) hours as needed., Disp: 10 tablet, Rfl: 0   hyoscyamine  (LEVSIN  SL) 0.125 MG SL tablet, DISSOLVE 1 TABLET UNDER THE TONGUE EVERY 4 HOURS IF NEEDED FOR NAUSEA, BLOATING, CRAMPING, OR DIARRHEA, Disp: 120 tablet, Rfl: 3   ipratropium-albuterol  (DUONEB) 0.5-2.5 (3) MG/3ML SOLN, USE 1 VIAL IN NEBULIZER EVERY 4 HOURS AS NEEDED **MAX 6 DOSES A DAY**, Disp: 540 mL, Rfl: 0   levothyroxine  (SYNTHROID ) 150 MCG tablet, Take 150 mcg by mouth daily before breakfast., Disp: , Rfl:    levothyroxine  (SYNTHROID ) 50 MCG tablet, Take 1 tablet (50 mcg total) by mouth daily., Disp: 30 tablet, Rfl: 11   lidocaine -prilocaine  (EMLA ) cream, Apply 1 Application topically as needed., Disp: 30 g, Rfl: 0   ondansetron  (ZOFRAN ) 8 MG tablet, TAKE 1 TABLET BY MOUTH 3 TIMES A DAY AS NEEDED FOR NAUSEA (Patient taking differently: Take 8 mg by mouth every 8 (eight) hours as needed. TAKE 1 TABLET BY MOUTH 3 TIMES A DAY AS NEEDED FOR NAUSEA), Disp: 60 tablet, Rfl: 2   pantoprazole  (PROTONIX ) 20 MG tablet, TAKE 1 TABLET BY MOUTH DAILY, Disp: 90 tablet, Rfl: 2   rosuvastatin  (CRESTOR ) 5 MG tablet, Take  1 tablet 3 x /week for Cholesterol                                                         /  TAKE                                     BY                                  MOUTH, Disp: 39 tablet, Rfl: 3    Physical Exam: Blood pressure 124/82, pulse 74, resp. rate 16, height 5' 3 (1.6 m), weight 168 lb 9.6 oz (76.5 kg), SpO2 96%.   Affect appropriate Healthy:  appears stated age HEENT: normal Neck supple with no adenopathy JVP normal no bruits no  thyromegaly Lungs clear with no wheezing and good diaphragmatic motion Heart:  S1/S2 no murmur, no rub, gallop or click PMI normal Abdomen: benighn, BS positve, no tenderness, no AAA no bruit.  No HSM or HJR Distal pulses intact with no bruits Plus 1-2  edema worse on left with varicosities  Neuro non-focal Skin warm and dry No muscular weakness   Labs:   Lab Results  Component Value Date   WBC 6.9 04/05/2023   HGB 10.5 (L) 04/05/2023   HCT 34.0 (L) 04/05/2023   MCV 93.4 04/05/2023   PLT 385 04/05/2023   No results for input(s): NA, K, CL, CO2, BUN, CREATININE, CALCIUM , PROT, BILITOT, ALKPHOS, ALT, AST, GLUCOSE in the [name] 168 hours.  Invalid input(s): LABALBU Lab Results  Component Value Date   CKTOTAL 60 02/06/2021    Lab Results  Component Value Date   CHOL 136 03/18/2023   CHOL 197 12/16/2022   CHOL 166 09/15/2022   Lab Results  Component Value Date   HDL 66 03/18/2023   HDL 67 12/16/2022   HDL 64 09/15/2022   Lab Results  Component Value Date   LDLCALC 54 03/18/2023   LDLCALC 109 (H) 12/16/2022   LDLCALC 78 09/15/2022   Lab Results  Component Value Date   TRIG 77 03/18/2023   TRIG 103 12/16/2022   TRIG 143 09/15/2022   Lab Results  Component Value Date   CHOLHDL 2.1 03/18/2023   CHOLHDL 2.9 12/16/2022   CHOLHDL 2.6 09/15/2022   No results found for: LDLDIRECT    Radiology: No results found.   EKG: SR chronic LBBB   ASSESSMENT AND PLAN:   Peripheral edema:  not clear that this is from diastolic dysfunction Update TTE check BMET/BNP. Likely more related to hypothyroidism and CRF, and varicosities  HLD:  continue statin labs with primary CRF: baseline CR 1.7 follow with increased diuretic F/U nephrology Hypothyroidism. Synthroid  dose increase by primary TSH still elevated HTN:  continue beta blocker  Emphysema:  former smoker has duonep no active wheezing Check PA pressures on TTE Rectal Prolapse  f/u general  surgery and primary mild on CT 04/05/23 Preoperative; Clear to have rectal prolapse surgery   TTE BMET/BNP/TSH  F/U with Dr Rafe in 4-6 weeks   Signed: Maude Emmer 05/05/2023, 10:53 AM

## 2023-04-30 NOTE — Telephone Encounter (Signed)
 Primary Cardiologist:Peter Delford, MD  Chart reviewed as part of pre-operative protocol coverage. Because of [name] past medical history and time since last visit, [name] will require a follow-up visit in order to better assess preoperative cardiovascular risk.  Pre-op covering staff: -Patient has an appointment with Dr. Delford on 05/05/2023 at which time clearance can be addressed.  Appointment notes have been updated. - Please contact requesting surgeon's office via preferred method (i.e, phone, fax) to inform them of need for appointment prior to surgery.    Rosaline EMERSON Bane, NP-C  04/30/2023, 10:00 AM [address], [address] Office [phone] Fax [phone]

## 2023-04-30 NOTE — Telephone Encounter (Signed)
 Preop added to notes on 1/8

## 2023-05-03 NOTE — Telephone Encounter (Signed)
 Called patient to let her know Dr. Delford stated she needed to see her nephrologist to adjust her lasix . Patient stated she is not taking lasix  due to making her dizzy. Patient stated that her nephrologist to her she did not need to be seen by them. Patient is also seeing Dr. Nishan this week to be cleared for surgery. Encouraged patient to go to ED if her symptoms did not improve.

## 2023-05-05 ENCOUNTER — Ambulatory Visit: Payer: PPO | Attending: Cardiovascular Disease | Admitting: Cardiovascular Disease

## 2023-05-05 ENCOUNTER — Encounter: Payer: Self-pay | Admitting: Cardiovascular Disease

## 2023-05-05 VITALS — BP 124/82 | HR 74 | Resp 16 | Ht 63.0 in | Wt 168.6 lb

## 2023-05-05 DIAGNOSIS — M7989 Other specified soft tissue disorders: Secondary | ICD-10-CM

## 2023-05-05 DIAGNOSIS — R0602 Shortness of breath: Secondary | ICD-10-CM

## 2023-05-05 NOTE — Patient Instructions (Addendum)
 Medication Instructions:  Your physician recommends that you continue on your current medications as directed. Please refer to the Current Medication list given to you today.  *If you need a refill on your cardiac medications before your next appointment, please call your pharmacy*   Lab Work: BMET and BNP and TSH If you have labs (blood work) drawn today and your tests are completely normal, you will receive your results only by: MyChart Message (if you have MyChart) OR A paper copy in the mail If you have any lab test that is abnormal or we need to change your treatment, we will call you to review the results.   Testing/Procedures: Your physician has requested that you have an echocardiogram. Echocardiography is a painless test that uses sound waves to create images of your heart. It provides your doctor with information about the size and shape of your heart and how well your heart's chambers and valves are working. This procedure takes approximately one hour. There are no restrictions for this procedure. Please do NOT wear cologne, perfume, aftershave, or lotions (deodorant is allowed). Please arrive 15 minutes prior to your appointment time.  Please note: We ask at that you not bring children with you during ultrasound (echo/ vascular) testing. Due to room size and safety concerns, children are not allowed in the ultrasound rooms during exams. Our front office staff cannot provide observation of children in our lobby area while testing is being conducted. An adult accompanying a patient to their appointment will only be allowed in the ultrasound room at the discretion of the ultrasound technician under special circumstances. We apologize for any inconvenience.    Follow-Up: At [facility], you and your health needs are our priority.  As part of our continuing mission to provide you with exceptional heart care, we have created designated Provider Care Teams.  These Care Teams  include your primary Cardiologist (physician) and Advanced Practice Providers (APPs -  Physician Assistants and Nurse Practitioners) who all work together to provide you with the care you need, when you need it.  We recommend signing up for the patient portal called MyChart.  Sign up information is provided on this After Visit Summary.  MyChart is used to connect with patients for Virtual Visits (Telemedicine).  Patients are able to view lab/test results, encounter notes, upcoming appointments, etc.  Non-urgent messages can be sent to your provider as well.   To learn more about what you can do with MyChart, go to [email or web address].    Your next appointment:   6 week(s)  Provider:   Darryle ONEIDA Decent, MD

## 2023-05-06 ENCOUNTER — Telehealth: Payer: Self-pay

## 2023-05-06 DIAGNOSIS — Z79899 Other long term (current) drug therapy: Secondary | ICD-10-CM

## 2023-05-06 DIAGNOSIS — R0602 Shortness of breath: Secondary | ICD-10-CM

## 2023-05-06 DIAGNOSIS — M7989 Other specified soft tissue disorders: Secondary | ICD-10-CM

## 2023-05-06 LAB — BASIC METABOLIC PANEL
BUN/Creatinine Ratio: 20 (ref 12–28)
BUN: 32 mg/dL — ABNORMAL HIGH (ref 8–27)
CO2: 24 mmol/L (ref 20–29)
Calcium: 9.6 mg/dL (ref 8.7–10.3)
Chloride: 103 mmol/L (ref 96–106)
Creatinine, Ser: 1.63 mg/dL — ABNORMAL HIGH (ref 0.57–1.00)
Glucose: 97 mg/dL (ref 70–99)
Potassium: 5.3 mmol/L — ABNORMAL HIGH (ref 3.5–5.2)
Sodium: 138 mmol/L (ref 134–144)
eGFR: 34 mL/min/{1.73_m2} — ABNORMAL LOW (ref >59)

## 2023-05-06 LAB — PRO B NATRIURETIC PEPTIDE: NT-Pro BNP: 993 pg/mL — ABNORMAL HIGH (ref 0–301)

## 2023-05-06 LAB — TSH: TSH: 5.18 u[IU]/mL — ABNORMAL HIGH (ref 0.450–4.500)

## 2023-05-06 MED ORDER — TORSEMIDE 10 MG PO TABS: ORAL_TABLET | ORAL | 3 refills | Status: DC

## 2023-05-06 NOTE — Telephone Encounter (Signed)
 Called patient with Dr. Fabio Bering advisement. Patient will take demadex 10 mg by mouth every other day and then take daily. Patient will go to [facility] for BMET in 3 weeks. Patient verbalized understanding.

## 2023-05-06 NOTE — Telephone Encounter (Signed)
-----   Message from [name] sent at 05/06/2023 10:06 AM EST ----- Call her in demedex Take 10 mg every other day for two weeks then daily check BMET in 3 weeks ----- Message ----- From: Drena Noel Males, RN Sent: 05/06/2023   9:42 AM EST To: Lauren JAYSON Emmer, MD  Called patient with lab results. Patient stated that she is not taking anything right now. Patient stated she has taken lasix  in the past, but it made her dizzy and she could not tolerate it. Will forward to Dr. Emmer to see if he wants to put patient on a milder diuretic.

## 2023-05-10 ENCOUNTER — Encounter: Payer: Self-pay | Admitting: Nurse Practitioner

## 2023-05-10 ENCOUNTER — Telehealth: Payer: Self-pay | Admitting: Cardiovascular Disease

## 2023-05-10 ENCOUNTER — Encounter: Payer: Self-pay | Admitting: Obstetrics and Gynecology

## 2023-05-10 NOTE — Telephone Encounter (Signed)
 Called patient with no answer. Left vm to call back.

## 2023-05-10 NOTE — Telephone Encounter (Signed)
 Pt c/o medication issue:  1. Name of Medication:   torsemide  (DEMADEX ) 10 MG tablet    2. How are you currently taking this medication (dosage and times per day)? Not taking anymore  3. Are you having a reaction (difficulty breathing--STAT)? yes  4. What is your medication issue? Patient was put on this medication but Dr. Nishan but is scheduled for follow up with Dr. Barbaraann. Patient states that she fell 3 times last night, her BP was 87/56. Patient states that she is going to stop taking this medication.

## 2023-05-10 NOTE — Telephone Encounter (Signed)
 Called and spoke to patient. Verified name and DOB. Patient called to report that she has stopped taking her Torsemide  for about a week and a half. She states that she has Interstitial Cystitis and with the urinary frequency along with the pain she stopped it for now. Patient stated she has had numerous fall over the last 3 weeks. Her BP has also been low. She deny dizziness, lightheadedness, CP or any other symptoms at this time. She fell 3 times last night. The first time she was sitting on the side of the bed and she slid off. The second and third time she was in the bathroom. She report she do not know she is falling until she is on her was down. She do not feel faint nor loss consciousness.

## 2023-05-10 NOTE — Telephone Encounter (Signed)
 Patient returned RN's call regarding medication issue.

## 2023-05-11 ENCOUNTER — Encounter: Payer: Self-pay | Admitting: Nurse Practitioner

## 2023-05-11 ENCOUNTER — Ambulatory Visit (INDEPENDENT_AMBULATORY_CARE_PROVIDER_SITE_OTHER): Payer: PPO | Admitting: Nurse Practitioner

## 2023-05-11 VITALS — BP 110/70 | HR 93 | Temp 98.0°F | Ht 63.0 in | Wt 171.0 lb

## 2023-05-11 DIAGNOSIS — R3911 Hesitancy of micturition: Secondary | ICD-10-CM

## 2023-05-11 DIAGNOSIS — R300 Dysuria: Secondary | ICD-10-CM

## 2023-05-11 DIAGNOSIS — R350 Frequency of micturition: Secondary | ICD-10-CM

## 2023-05-11 DIAGNOSIS — R600 Localized edema: Secondary | ICD-10-CM

## 2023-05-11 DIAGNOSIS — N3010 Interstitial cystitis (chronic) without hematuria: Secondary | ICD-10-CM | POA: Diagnosis not present

## 2023-05-11 DIAGNOSIS — K623 Rectal prolapse: Secondary | ICD-10-CM

## 2023-05-11 MED ORDER — DOXYCYCLINE HYCLATE 100 MG PO TABS: 100.0000 mg | ORAL_TABLET | Freq: Two times a day (BID) | ORAL | 0 refills | Status: AC

## 2023-05-11 NOTE — Telephone Encounter (Signed)
 Spoke with pt, she has a UTI and is going to get an appointment with her medical doctor today. She feels the falls are related to being asleep and walking around. She does not want to make an appointment right now. She will let us  know by the end of the week if she would like to be seen.

## 2023-05-11 NOTE — Progress Notes (Signed)
 Assessment and Plan:  [name] was seen today for an episodic visit.  Diagnoses and all order for this visit:  Dysuria (Primary)/Interstitial cystitis/rectal prolapse Follows with new UroGyn Dr. Darol and Rectal Clinic Dr. Lang - pending surgical intervention per cardiac clearance which was given 05/05/23. Start prophylactic treatment wit Doxycycline  give multiple allergies to antibiotic medication  Monitor for any increase in fever, chills, N/V, abdominal pain, hematuria.   Contact office or report to ER for further evaluation if s/s fail to improve or any sign of worsening infection as noted above.   - Urinalysis, Routine w reflex microscopic - Urine Culture - doxycycline  (VIBRA -TABS) 100 MG tablet; Take 1 tablet (100 mg total) by mouth 2 (two) times daily for 7 days.  Dispense: 14 tablet; Refill: 0  Urinary frequency/hesitance of micturition Continue wearing pads/briefs and change often  Localized edema Consider lasix  - patient addiment about not taking d/t increase in urination and dysuria.    Continue to monitor for any increase in fever, chills, N/V, diarrhea, changes to bowel habits, blood in stool.  Notify office for further evaluation and treatment, questions or concerns if s/s fail to improve. The risks and benefits of my recommendations, as well as other treatment options were discussed with the patient today. Questions were answered.  Further disposition pending results of labs. Discussed med's effects and SE's.    Over 30 minutes of exam, counseling, chart review, and critical decision making was performed.   Future Appointments  Date Time Provider Department Center  05/27/2023  2:05 PM [facility] ECHO 5 [id number]  06/10/2023  8:00 AM O'Neal, Darryle Ned, MD [facility] None  06/24/2023 10:00 AM [name], NP [facility] None  09/22/2023 10:30 AM [name], NP [facility] None     ------------------------------------------------------------------------------------------------------------------   HPI BP 110/70   Pulse 93   Temp 98 F (36.7 C)   Ht 5' 3 (1.6 m)   Wt 171 lb (77.6 kg)   SpO2 98%   BMI 30.29 kg/m   73 y.o.female presents for evaluation of contanimated urine given hx of rectal and vaginal prolpase with associated interstitial cystitis.  She is in mild distress today due pain. Unable to take narcotic pain medications due to increase in constipation, causeing increase in overall pain.  She is currently following with Dr. Lang pending surgical intervention however, awating consutation apt with Dr. Darol Coffee who, per Dr. Darol anthony, may be able to assist with surghery.  However, pt states Dr. Darol is not availabie until March and she will be unable to deal with the paiin for that long as it is excrutiating and nothing helps. Pain is >10/10.  She did follow with Cardiology Dr. Delford who has cleared her for surgery.  Does not feel as though BLE edma is r/t CHF but lymphadema and vascualr varicose veins.  Patient is not taking Lasix  d/t increase in urination.  She denies fever, chill, N/V.    Past Medical History:  Diagnosis Date   Chronic diastolic CHF (congestive heart failure) (HCC)    cardriologist--- dr lelon. barbaraann;   dx in setting sepsis 11/ 2020;   echo in epic 03-10-2019 ef 55-60%, G1DD, mild to moderate TR no stenosis and normal RASP;  CTA 01-31-2021 epic   Chronic kidney disease (CKD), stage III (moderate) [facility])    nephrologist--- dr val   Depression    Diverticulosis of colon    Emphysema of lung (HCC)    no home oxygen   GERD (gastroesophageal  reflux disease)    History of acute pyelonephritis    w/ hx sepsis   History of Helicobacter pylori infection 2012   History of recurrent UTIs    History of TIA (transient ischemic attack)    TIAs   Hyperlipidemia, mixed    Hypertension    followed by pcp and  nephrologist----  (nuclear stress test in epic 06-28-2015 low risk no ischemia, nuclear ef 60%) no meds   Hypothyroidism    IBS (irritable bowel syndrome)    IC (interstitial cystitis)    remote hx   Inguinal hernia, bilateral    Irritable bowel syndrome with constipation and diarrhea    LBBB (left bundle branch block) 2015   cardiologist --- dr barbaraann;  known chronic LBBB for several yrs   OCD (obsessive compulsive disorder)    OSA (obstructive sleep apnea)    no CPAP intolerant   Osteopenia    Prolapsed hemorrhoids    Seasonal allergies    Vitamin D  deficiency      Allergies  Allergen Reactions   Ace Inhibitors Other (See Comments)    Acute renal failure (lisinopril )   Cephalexin Anaphylaxis   Dilaudid  [Hydromorphone  Hcl] Anaphylaxis   Robaxin  [Methocarbamol ] Anaphylaxis   Macrobid  [Nitrofurantoin ] Other (See Comments)    Causes hyperthermia    Levaquin  [Levofloxacin ] Nausea And Vomiting   Ciprofloxacin  Rash   Codeine Nausea Only   Latex Rash   Sulfa  Antibiotics Other (See Comments)    Do not take regularly per nephrology.  Can use for acute issues.     Current Outpatient Medications on File Prior to Visit  Medication Sig   aspirin  EC 81 MG tablet Take 81 mg by mouth daily.    buPROPion  (WELLBUTRIN  XL) 150 MG 24 hr tablet Take 3 tablets (450 mg total) by mouth daily for depression.   cetirizine (ZYRTEC) 10 MG tablet Take 10 mg by mouth daily.   Cholecalciferol (VITAMIN D -3) 5000 UNITS TABS Take 5,000 Units by mouth daily.    diazepam  (VALIUM ) 5 MG tablet Place 1 tablet vaginally nightly as needed for muscle spasm/ pelvic pain.   estradiol  (ESTRACE ) 0.1 MG/GM vaginal cream PLACE 1/2 GRAM VAGINALLY EVERY NIGHT AT BEDTIME FOR 2 WEEKS THEN USE 2 TIMES PER WEEK THEREAFTER   gabapentin  (NEURONTIN ) 600 MG tablet Take 1/2 to 1 tablet 2 to 3 x /Daily as needed for Pain   guaifenesin  (HUMIBID E) 400 MG TABS tablet Take 400 mg by mouth daily.   HYDROcodone -acetaminophen   (NORCO/VICODIN) 5-325 MG tablet Take 1 tablet by mouth every 4 (four) hours as needed.   hyoscyamine  (LEVSIN  SL) 0.125 MG SL tablet DISSOLVE 1 TABLET UNDER THE TONGUE EVERY 4 HOURS IF NEEDED FOR NAUSEA, BLOATING, CRAMPING, OR DIARRHEA   ipratropium-albuterol  (DUONEB) 0.5-2.5 (3) MG/3ML SOLN USE 1 VIAL IN NEBULIZER EVERY 4 HOURS AS NEEDED **MAX 6 DOSES A DAY**   levothyroxine  (SYNTHROID ) 150 MCG tablet Take 150 mcg by mouth daily before breakfast.   levothyroxine  (SYNTHROID ) 50 MCG tablet Take 1 tablet (50 mcg total) by mouth daily.   lidocaine -prilocaine  (EMLA ) cream Apply 1 Application topically as needed.   ondansetron  (ZOFRAN ) 8 MG tablet TAKE 1 TABLET BY MOUTH 3 TIMES A DAY AS NEEDED FOR NAUSEA (Patient taking differently: Take 8 mg by mouth every 8 (eight) hours as needed. TAKE 1 TABLET BY MOUTH 3 TIMES A DAY AS NEEDED FOR NAUSEA)   pantoprazole  (PROTONIX ) 20 MG tablet TAKE 1 TABLET BY MOUTH DAILY   rosuvastatin  (CRESTOR ) 5 MG tablet  Take  1 tablet 3 x /week for Cholesterol                                                         /                    TAKE                                     BY                                  MOUTH   torsemide  (DEMADEX ) 10 MG tablet Take one tablet by mouth every other day for two weeks, then take one tablet by mouth daily.   No current facility-administered medications on file prior to visit.    ROS: all negative except what is noted in the HPI.   Physical Exam:  BP 110/70   Pulse 93   Temp 98 F (36.7 C)   Ht 5' 3 (1.6 m)   Wt 171 lb (77.6 kg)   SpO2 98%   BMI 30.29 kg/m   General Appearance: NAD.  Awake, conversant and cooperative. Eyes: PERRLA, EOMs intact.  Sclera white.  Conjunctiva without erythema. Sinuses: No frontal/maxillary tenderness.  No nasal discharge. Nares patent.  ENT/Mouth: Ext aud canals clear.  Bilateral TMs w/DOL and without erythema or bulging. Hearing intact.  Posterior pharynx without swelling or exudate.  Tonsils without  swelling or erythema.  Neck: Supple.  No masses, nodules or thyromegaly. Respiratory: Effort is regular with non-labored breathing. Breath sounds are equal bilaterally without rales, rhonchi, wheezing or stridor.  Cardio: RRR with no MRGs. Brisk peripheral pulses without edema.  Abdomen: Active BS in all four quadrants.  Soft and non-tender without guarding, rebound tenderness, hernias or masses. Lymphatics: Non tender without lymphadenopathy.  Musculoskeletal: Full ROM, 5/5 strength, normal ambulation.  No clubbing or cyanosis. Skin: Appropriate color for ethnicity. Warm without rashes, lesions, ecchymosis, ulcers.  Neuro: CN II-XII grossly normal. Normal muscle tone without cerebellar symptoms and intact sensation.   Psych: AO X 3,  appropriate mood and affect, insight and judgment.     BASCOM NECESSARY, NP 1:19 PM [facility]

## 2023-05-11 NOTE — Patient Instructions (Signed)
Urinary Tract Infection, Adult A urinary tract infection (UTI) is an infection of any part of the urinary tract. The urinary tract includes: The kidneys. The ureters. The bladder. The urethra. These organs make, store, and get rid of pee (urine) in the body. What are the causes? This infection is caused by germs (bacteria) in your genital area. These germs grow and cause swelling (inflammation) of your urinary tract. What increases the risk? The following factors may make you more likely to develop this condition: Using a small, thin tube (catheter) to drain pee. Not being able to control when you pee or poop (incontinence). Being female. If you are female, these things can increase the risk: Using these methods to prevent pregnancy: A medicine that kills sperm (spermicide). A device that blocks sperm (diaphragm). Having low levels of a female hormone (estrogen). Being pregnant. You are more likely to develop this condition if: You have genes that add to your risk. You are sexually active. You take antibiotic medicines. You have trouble peeing because of: A prostate that is bigger than normal, if you are female. A blockage in the part of your body that drains pee from the bladder. A kidney stone. A nerve condition that affects your bladder. Not getting enough to drink. Not peeing often enough. You have other conditions, such as: Diabetes. A weak disease-fighting system (immune system). Sickle cell disease. Gout. Injury of the spine. What are the signs or symptoms? Symptoms of this condition include: Needing to pee right away. Peeing small amounts often. Pain or burning when peeing. Blood in the pee. Pee that smells bad or not like normal. Trouble peeing. Pee that is cloudy. Fluid coming from the vagina, if you are female. Pain in the belly or lower back. Other symptoms include: Vomiting. Not feeling hungry. Feeling mixed up (confused). This may be the first symptom in  older adults. Being tired and grouchy (irritable). A fever. Watery poop (diarrhea). How is this treated? Taking antibiotic medicine. Taking other medicines. Drinking enough water. In some cases, you may need to see a specialist. Follow these instructions at home:  Medicines Take over-the-counter and prescription medicines only as told by your doctor. If you were prescribed an antibiotic medicine, take it as told by your doctor. Do not stop taking it even if you start to feel better. General instructions Make sure you: Pee until your bladder is empty. Do not hold pee for a long time. Empty your bladder after sex. Wipe from front to back after peeing or pooping if you are a female. Use each tissue one time when you wipe. Drink enough fluid to keep your pee pale yellow. Keep all follow-up visits. Contact a doctor if: You do not get better after 1-2 days. Your symptoms go away and then come back. Get help right away if: You have very bad back pain. You have very bad pain in your lower belly. You have a fever. You have chills. You feeling like you will vomit or you vomit. Summary A urinary tract infection (UTI) is an infection of any part of the urinary tract. This condition is caused by germs in your genital area. There are many risk factors for a UTI. Treatment includes antibiotic medicines. Drink enough fluid to keep your pee pale yellow. This information is not intended to replace advice given to you by your health care provider. Make sure you discuss any questions you have with your health care provider. Document Revised: 11/19/2019 Document Reviewed: 11/24/2019 Elsevier Patient Education    2024 Elsevier Inc.  

## 2023-05-13 LAB — URINE CULTURE
MICRO NUMBER:: 15953335
SPECIMEN QUALITY:: ADEQUATE

## 2023-05-13 LAB — URINALYSIS, ROUTINE W REFLEX MICROSCOPIC
Bilirubin Urine: NEGATIVE
Glucose, UA: NEGATIVE
Hyaline Cast: NONE SEEN /LPF
Ketones, ur: NEGATIVE
Nitrite: NEGATIVE
Specific Gravity, Urine: 1.019 (ref 1.001–1.035)
Squamous Epithelial / HPF: NONE SEEN /HPF (ref <=5)
pH: 6 (ref 5.0–8.0)

## 2023-05-13 LAB — MICROSCOPIC MESSAGE

## 2023-05-13 NOTE — Telephone Encounter (Signed)
Calling to back for the clearance and to get the notes from her office visit on 1/8. Please advise. Fax #[phone]

## 2023-05-13 NOTE — Telephone Encounter (Signed)
I tried to call the requesting office to confirm if ASA needed to be held as well as I did not see this noted ok to hold. I was being transferred when the call was dropped.   I will run this past the pre op APP today as well to review notes if pt has been cleared. I do see in Dr. Fabio Bering notes pt has been cleared though I do not see any notes if ok to hold ASA.

## 2023-05-14 ENCOUNTER — Encounter: Payer: Self-pay | Admitting: Physical Therapy

## 2023-05-14 NOTE — Telephone Encounter (Signed)
   Patient Name: [name]  DOB: May 03, 1950 MRN: [id number]  Primary Cardiologist: Reatha Harps, MD  Chart reviewed as part of pre-operative protocol coverage. Given past medical history and time since last visit, based on ACC/AHA guidelines, [name] is at acceptable risk for the planned procedure without further cardiovascular testing.   The patient was advised that if she develops new symptoms prior to surgery to contact our office to arrange for a follow-up visit, and she verbalized understanding.  Per protocol if needed patient can hold ASA 81 mg 5 to 7 days prior to procedure and should restart postprocedure when surgically safe and advised by her surgeon.  I will route this recommendation to the requesting party via Epic fax function and remove from pre-op pool.  Please call with questions.  Napoleon Form, Leodis Rains, NP 05/14/2023, 8:56 AM

## 2023-05-17 ENCOUNTER — Ambulatory Visit: Payer: PPO | Admitting: Physical Therapy

## 2023-05-20 DIAGNOSIS — N3010 Interstitial cystitis (chronic) without hematuria: Secondary | ICD-10-CM | POA: Diagnosis not present

## 2023-05-24 ENCOUNTER — Encounter: Payer: PPO | Admitting: Physical Therapy

## 2023-05-27 ENCOUNTER — Ambulatory Visit (HOSPITAL_COMMUNITY): Payer: PPO | Attending: Cardiovascular Disease

## 2023-05-27 DIAGNOSIS — R0602 Shortness of breath: Secondary | ICD-10-CM | POA: Diagnosis present

## 2023-05-27 DIAGNOSIS — M7989 Other specified soft tissue disorders: Secondary | ICD-10-CM | POA: Diagnosis present

## 2023-05-27 LAB — ECHOCARDIOGRAM COMPLETE
AR max vel: 2.42 cm2
AV Area VTI: 2.44 cm2
AV Area mean vel: 2.48 cm2
AV Mean grad: 6.5 mm[Hg]
AV Peak grad: 11.6 mm[Hg]
Ao pk vel: 1.71 m/s
Area-P 1/2: 4.52 cm2
Est EF: 55
P 1/2 time: 399 ms
S' Lateral: 3.15 cm

## 2023-05-31 ENCOUNTER — Encounter: Payer: Self-pay | Admitting: Internal Medicine

## 2023-05-31 MED ORDER — PANTOPRAZOLE SODIUM 20 MG PO TBEC: 20.0000 mg | DELAYED_RELEASE_TABLET | Freq: Every day | ORAL | 2 refills | Status: DC

## 2023-06-05 ENCOUNTER — Other Ambulatory Visit: Payer: Self-pay | Admitting: Internal Medicine

## 2023-06-05 DIAGNOSIS — F32A Depression, unspecified: Secondary | ICD-10-CM

## 2023-06-05 DIAGNOSIS — F988 Other specified behavioral and emotional disorders with onset usually occurring in childhood and adolescence: Secondary | ICD-10-CM

## 2023-06-07 ENCOUNTER — Other Ambulatory Visit: Payer: Self-pay

## 2023-06-07 ENCOUNTER — Other Ambulatory Visit (HOSPITAL_COMMUNITY): Payer: Self-pay

## 2023-06-07 DIAGNOSIS — F32A Depression, unspecified: Secondary | ICD-10-CM

## 2023-06-07 DIAGNOSIS — F988 Other specified behavioral and emotional disorders with onset usually occurring in childhood and adolescence: Secondary | ICD-10-CM

## 2023-06-07 MED ORDER — BUPROPION HCL ER (XL) 150 MG PO TB24
450.0000 mg | ORAL_TABLET | Freq: Every day | ORAL | 0 refills | Status: DC
Start: 2023-06-07 — End: 2023-06-08

## 2023-06-08 ENCOUNTER — Other Ambulatory Visit: Payer: Self-pay

## 2023-06-08 DIAGNOSIS — F32A Depression, unspecified: Secondary | ICD-10-CM

## 2023-06-08 DIAGNOSIS — F988 Other specified behavioral and emotional disorders with onset usually occurring in childhood and adolescence: Secondary | ICD-10-CM

## 2023-06-08 MED ORDER — LEVOTHYROXINE SODIUM 150 MCG PO TABS: 150.0000 ug | ORAL_TABLET | Freq: Every day | ORAL | 0 refills | Status: DC

## 2023-06-08 MED ORDER — BUPROPION HCL ER (XL) 150 MG PO TB24: 450.0000 mg | ORAL_TABLET | Freq: Every day | ORAL | 0 refills | Status: DC

## 2023-06-09 NOTE — Progress Notes (Unsigned)
Cardiology Office Note:  .   Date:  06/10/2023  ID:  [name], DOB Jan 14, 1951, MRN [id number] PCP: Linus Galas, NP  [facility] Providers Cardiologist:  Reatha Harps, MD    History of Present Illness: .    Chief Complaint  Patient presents with   Leg Swelling    [name] is a 73 y.o. female with history of LBBB, HFpEF who presents for follow-up.    History of Present Illness   [name] "[name]" is a 73 year old female with heart failure with preserved ejection fraction and CKD stage 3B who presents for follow-up of lower extremity edema.  She notes improvement in swelling but persistent soreness in her lower extremities. She attributes some of the swelling to venous insufficiency and manages it by elevating her legs and monitoring her salt intake. Her weight fluctuates between 165 and 175 pounds within a week.  Her breathing is generally stable unless she overexerts herself. She has a history of chronic obstructive pulmonary disease and acknowledges a tendency to retain fluid. She was previously prescribed torsemide but discontinued it due to lack of efficacy and adverse effects, including frequent urination every fifteen minutes.  She is currently on rosuvastatin 5 mg three times a week, which has improved her cholesterol levels, with an LDL of 54. She is not diabetic but has been told she is prediabetic. She follows a special diet due to interstitial cystitis and is cautious about salt intake, avoiding processed foods and bread.  She has a history of a transient ischemic attack and is on a baby aspirin. She is transitioning her primary care to a nurse practitioner at [facility] after the passing of her previous primary care physician.  She experiences difficulty with exercise due to a rectal prolapse, which causes significant discomfort and interferes with her activities.          Problem List 1. LBBB -EF 55-60% 2. COPD 3.  HFpEF -Dx 02/2019 in setting of sepsis and anaphylactic reaction 4.  HLD -T chol 136, HDL 66, LDL 54, TG 77 5.  CKD stage IIIb -eGFR 34 6. TIA    ROS: All other ROS reviewed and negative. Pertinent positives noted in the HPI.     Studies Reviewed: Marland Kitchen       TTE 05/27/2023  1. Left ventricular ejection fraction, by estimation, is 55%. The left  ventricle has normal function. The left ventricle demonstrates regional  wall motion abnormalities with septal-lateral dyssynchrony consistent with  LBBB. There is mild concentric left   ventricular hypertrophy. Left ventricular diastolic parameters are  consistent with Grade I diastolic dysfunction (impaired relaxation). The  average left ventricular global longitudinal strain is -21.5 %. The global  longitudinal strain is normal.   2. Right ventricular systolic function is normal. The right ventricular  size is normal. There is normal pulmonary artery systolic pressure. The  estimated right ventricular systolic pressure is 32.6 mmHg.   3. Left atrial size was mildly dilated.   4. In the subcostal view, there was initial concern for a mass in the  right atrium. On further review, this appears to be an enfolding of the  atrial wall.   5. The mitral valve is normal in structure. Trivial mitral valve  regurgitation.   6. The aortic valve is tricuspid. Aortic valve regurgitation is trivial.  No aortic stenosis is present.   7. The inferior vena cava is normal in size with greater than 50%  respiratory variability, suggesting right atrial pressure of 3 mmHg.  Physical Exam:   VS:  BP 106/72   Pulse 76   Ht 5\' 3"  (1.6 m)   Wt 169 lb 6.4 oz (76.8 kg)   SpO2 94%   BMI 30.01 kg/m    Wt Readings from Last 3 Encounters:  06/10/23 169 lb 6.4 oz (76.8 kg)  05/11/23 171 lb (77.6 kg)  05/05/23 168 lb 9.6 oz (76.5 kg)    GEN: Well nourished, well developed in no acute distress NECK: No JVD; No carotid bruits CARDIAC: RRR, no murmurs, rubs,  gallops RESPIRATORY:  Clear to auscultation without rales, wheezing or rhonchi  ABDOMEN: Soft, non-tender, non-distended EXTREMITIES: trace edema ASSESSMENT AND PLAN: .   Assessment and Plan    Lower Extremity Edema HFpEF Improved with conservative management (elevation, salt restriction). Evidence of venous insufficiency. Torsemide was previously tried but discontinued due to lack of improvement and patient's preference. -Continue conservative management. -Consider use of standard compression stockings. -Resume Torsemide as needed. -salt reduction recommended   Hyperlipidemia LDL 54 on Rosuvastatin 5mg  three times a week. -Continue Rosuvastatin 5mg  three times a week.  Chronic Kidney Disease (CKD) Stage 3B Stable. -Continue current management.  History of Transient Ischemic Attack (TIA) On Aspirin. -Continue Aspirin.  Follow-up in 1 year or sooner if any changes.              Follow-up: Return in about 1 year (around 06/09/2024).   Signed, Lenna Gilford. Flora Lipps, MD, [facility]  [facility]  [address] [place], [place] [address] [phone]  8:15 AM

## 2023-06-10 ENCOUNTER — Encounter: Payer: Self-pay | Admitting: Cardiovascular Disease

## 2023-06-10 ENCOUNTER — Ambulatory Visit: Payer: PPO | Attending: Cardiovascular Disease | Admitting: Cardiovascular Disease

## 2023-06-10 VITALS — BP 106/72 | HR 76 | Ht 63.0 in | Wt 169.4 lb

## 2023-06-10 DIAGNOSIS — I5032 Chronic diastolic (congestive) heart failure: Secondary | ICD-10-CM

## 2023-06-10 DIAGNOSIS — M7989 Other specified soft tissue disorders: Secondary | ICD-10-CM | POA: Diagnosis not present

## 2023-06-10 DIAGNOSIS — I872 Venous insufficiency (chronic) (peripheral): Secondary | ICD-10-CM

## 2023-06-10 DIAGNOSIS — I447 Left bundle-branch block, unspecified: Secondary | ICD-10-CM

## 2023-06-10 NOTE — Patient Instructions (Signed)
Medication Instructions:  NO changes.  *If you need a refill on your cardiac medications before your next appointment, please call your pharmacy*   Follow-Up: At [facility], you and your health needs are our priority.  As part of our continuing mission to provide you with exceptional heart care, we have created designated Provider Care Teams.  These Care Teams include your primary Cardiologist (physician) and Advanced Practice Providers (APPs -  Physician Assistants and Nurse Practitioners) who all work together to provide you with the care you need, when you need it.  We recommend signing up for the patient portal called "MyChart".  Sign up information is provided on this After Visit Summary.  MyChart is used to connect with patients for Virtual Visits (Telemedicine).  Patients are able to view lab/test results, encounter notes, upcoming appointments, etc.  Non-urgent messages can be sent to your provider as well.   To learn more about what you can do with MyChart, go to [email or web address].    Your next appointment:   1 year(s)  Provider:   Bernadene Person, NP      Other Instructions

## 2023-06-17 ENCOUNTER — Encounter: Payer: PPO | Admitting: Nurse Practitioner

## 2023-06-23 ENCOUNTER — Encounter: Payer: PPO | Admitting: Physical Therapy

## 2023-06-24 ENCOUNTER — Encounter: Payer: PPO | Admitting: Nurse Practitioner

## 2023-06-30 ENCOUNTER — Encounter: Payer: PPO | Admitting: Physical Therapy

## 2023-07-01 ENCOUNTER — Ambulatory Visit (INDEPENDENT_AMBULATORY_CARE_PROVIDER_SITE_OTHER): Payer: PPO | Admitting: Family Medicine

## 2023-07-01 ENCOUNTER — Encounter (HOSPITAL_BASED_OUTPATIENT_CLINIC_OR_DEPARTMENT_OTHER): Payer: Self-pay | Admitting: Family Medicine

## 2023-07-01 ENCOUNTER — Other Ambulatory Visit: Payer: Self-pay

## 2023-07-01 VITALS — BP 118/67 | HR 71 | Ht 63.0 in | Wt 164.2 lb

## 2023-07-01 DIAGNOSIS — E039 Hypothyroidism, unspecified: Secondary | ICD-10-CM | POA: Diagnosis not present

## 2023-07-01 DIAGNOSIS — F3342 Major depressive disorder, recurrent, in full remission: Secondary | ICD-10-CM

## 2023-07-01 DIAGNOSIS — K5790 Diverticulosis of intestine, part unspecified, without perforation or abscess without bleeding: Secondary | ICD-10-CM | POA: Insufficient documentation

## 2023-07-01 MED ORDER — AMITRIPTYLINE HCL 50 MG PO TABS: 50.0000 mg | ORAL_TABLET | Freq: Every day | ORAL | 0 refills | Status: DC

## 2023-07-01 NOTE — Patient Instructions (Signed)
   Medication Instructions:  Your physician recommends that you continue on your current medications as directed. Please refer to the Current Medication list given to you today. --If you need a refill on any your medications before your next appointment, please call your pharmacy first. If no refills are authorized on file call the office.-- Lab Work: Your physician has recommended that you have lab work today: today If you have labs (blood work) drawn today and your tests are completely normal, you will receive your results via MyChart message OR a phone call from our staff.  Please ensure you check your voicemail in the event that you authorized detailed messages to be left on a delegated number. If you have any lab test that is abnormal or we need to change your treatment, we will call you to review the results.    Follow-Up: Your next appointment:   Your physician recommends that you schedule a follow-up appointment in: 3-4 month follow up  with Dr. de [place]  You will receive a text message or e-mail with a link to a survey about your care and experience with [place] today! We would greatly appreciate your feedback!   Thanks for letting us be apart of your health journey!!  Primary Care and Sports Medicine   Dr. Ceasar Mons [place]   We encourage you to activate your patient portal called "MyChart".  Sign up information is provided on this After Visit Summary.  MyChart is used to connect with patients for Virtual Visits (Telemedicine).  Patients are able to view lab/test results, encounter notes, upcoming appointments, etc.  Non-urgent messages can be sent to your provider as well. To learn more about what you can do with MyChart, please visit --  [email or web address].

## 2023-07-01 NOTE — Progress Notes (Signed)
 New Patient Office Visit  Subjective   Patient ID: [name], female    DOB: [birth year]  Age: 73 y.o. MRN: [id number]  CC:  Chief Complaint  Patient presents with   New Patient (Initial Visit)    New patient was seeing dr Astrid Divine     HPI [name] presents to establish care Last PCP - Dr. Oneta Rack  Hypothyroidism: Was being managed by her PCP.  Currently taking levothyroxine 150 mcg daily.  Has had notably elevated TSH previously, has been gradually improving with adjusting dose of levothyroxine.  She has been taking current dose of levothyroxine for more than 2 months.  Last TSH was checked about 2 months ago and was slightly above reference range.  Patient currently is clinically euthyroid.  Depression: Currently utilizing Wellbutrin.  She was also started on trazodone for associated insomnia.  Primary concerns have been related to interstitial cystitis which has been an issue for her since Botox bladder injections.  She does follow with urogynecology related to ongoing issues  Patient does follow with cardiology related to underlying chronic diastolic heart failure.  Last appointment with Dr. Flora Lipps was last month.  No current issues in this regard.  She does have occasional lower extremity edema.  Does have diuretic available for use as needed.  Patient is originally form [employer].   Outpatient Encounter Medications as of 07/01/2023  Medication Sig   aspirin EC 81 MG tablet Take 81 mg by mouth daily.    buPROPion (WELLBUTRIN XL) 150 MG 24 hr tablet Take 3 tablets (450 mg total) by mouth daily for depression.   cetirizine (ZYRTEC) 10 MG tablet Take 10 mg by mouth daily.   Cholecalciferol (VITAMIN D-3) 5000 UNITS TABS Take 5,000 Units by mouth daily.    diazepam (VALIUM) 5 MG tablet Place 1 tablet vaginally nightly as needed for muscle spasm/ pelvic pain.   estradiol (ESTRACE) 0.1 MG/GM vaginal cream PLACE 1/2 GRAM VAGINALLY EVERY NIGHT AT BEDTIME FOR 2 WEEKS THEN USE 2  TIMES PER WEEK THEREAFTER   gabapentin (NEURONTIN) 600 MG tablet Take 1/2 to 1 tablet 2 to 3 x /Daily as needed for Pain   guaifenesin (HUMIBID E) 400 MG TABS tablet Take 400 mg by mouth daily.   HYDROcodone-acetaminophen (NORCO/VICODIN) 5-325 MG tablet Take 1 tablet by mouth every 4 (four) hours as needed.   hyoscyamine (LEVSIN SL) 0.125 MG SL tablet DISSOLVE 1 TABLET UNDER THE TONGUE EVERY 4 HOURS IF NEEDED FOR NAUSEA, BLOATING, CRAMPING, OR DIARRHEA   ipratropium-albuterol (DUONEB) 0.5-2.5 (3) MG/3ML SOLN USE 1 VIAL IN NEBULIZER EVERY 4 HOURS AS NEEDED **MAX 6 DOSES A DAY**   levothyroxine (SYNTHROID) 150 MCG tablet Take 1 tablet (150 mcg total) by mouth daily before breakfast.   lidocaine-prilocaine (EMLA) cream Apply 1 Application topically as needed.   ondansetron (ZOFRAN) 8 MG tablet TAKE 1 TABLET BY MOUTH 3 TIMES A DAY AS NEEDED FOR NAUSEA (Patient taking differently: Take 8 mg by mouth every 8 (eight) hours as needed. TAKE 1 TABLET BY MOUTH 3 TIMES A DAY AS NEEDED FOR NAUSEA)   pantoprazole (PROTONIX) 20 MG tablet Take 1 tablet (20 mg total) by mouth daily.   rosuvastatin (CRESTOR) 5 MG tablet Take  1 tablet 3 x /week for Cholesterol                                                         /  TAKE                                     BY                                  MOUTH   traZODone (DESYREL) 50 MG tablet Take 50 mg by mouth at bedtime.   [DISCONTINUED] amitriptyline (ELAVIL) 50 MG tablet Take 1 tablet (50 mg total) by mouth daily. (Patient not taking: Reported on 07/01/2023)   [DISCONTINUED] levothyroxine (SYNTHROID) 50 MCG tablet Take 1 tablet (50 mcg total) by mouth daily.   [DISCONTINUED] torsemide (DEMADEX) 10 MG tablet Take one tablet by mouth every other day for two weeks, then take one tablet by mouth daily.   No facility-administered encounter medications on file as of 07/01/2023.    Past Medical History:  Diagnosis Date   ADHD (attention deficit hyperactivity  disorder) 07/24/2019   Chronic diastolic CHF (congestive heart failure) (HCC)    cardriologist--- dr Lacretia Nicks. Flora Lipps;   dx in setting sepsis 11/ 2020;   echo in epic 03-10-2019 ef 55-60%, G1DD, mild to moderate TR no stenosis and normal RASP;  CTA 01-31-2021 epic   Chronic kidney disease (CKD), stage III (moderate) (HCC)    nephrologist--- dr Isa Rankin   Depression    Diverticulosis of colon    Emphysema of lung (HCC)    no home oxygen   GERD (gastroesophageal reflux disease)    History of acute pyelonephritis    w/ hx sepsis   History of Helicobacter pylori infection 2012   History of recurrent UTIs    History of TIA (transient ischemic attack)    TIAs   Hyperlipidemia, mixed    Hypertension    followed by pcp and nephrologist----  (nuclear stress test in epic 06-28-2015 low risk no ischemia, nuclear ef 60%) no meds   Hypothyroidism    IBS (irritable bowel syndrome)    IC (interstitial cystitis)    remote hx   Inguinal hernia, bilateral    Irritable bowel syndrome with constipation and diarrhea    LBBB (left bundle branch block) 2015   cardiologist --- dr Flora Lipps;  known chronic LBBB for several yrs   OCD (obsessive compulsive disorder)    OSA (obstructive sleep apnea)    no CPAP intolerant   Osteopenia    Prolapsed hemorrhoids    Seasonal allergies    Vitamin D deficiency     Past Surgical History:  Procedure Laterality Date   ANTERIOR AND POSTERIOR REPAIR WITH SACROSPINOUS FIXATION N/A 11/10/2021   Procedure: POSTERIOR REPAIR WITH SACROSPINOUS FIXATION;  Surgeon: Marguerita Beards, MD;  Location: [facility];  Service: Gynecology;  Laterality: N/A;  total time requested is 1.5 hrs   BLADDER SUSPENSION N/A 11/10/2021   Procedure: TRANSVAGINAL TAPE (TVT) PROCEDURE;  Surgeon: Marguerita Beards, MD;  Location: [facility];  Service: Gynecology;  Laterality: N/A;   BREAST ENHANCEMENT SURGERY Bilateral    1970s   BREAST IMPLANT REMOVAL Bilateral  03/07/2019   BUNIONECTOMY Bilateral 1997   CATARACT EXTRACTION W/ INTRAOCULAR LENS IMPLANT Right 2005   COLONOSCOPY  10/2020   CYSTOSCOPY WITH FULGERATION  11/10/2021   Procedure: CYSTOSCOPY WITH FULGERATION;  Surgeon: Marguerita Beards, MD;  Location: [facility];  Service: Gynecology;;   ESOPHAGOGASTRODUODENOSCOPY (EGD) WITH PROPOFOL N/A 03/12/2019  Procedure: ESOPHAGOGASTRODUODENOSCOPY (EGD) WITH PROPOFOL;  Surgeon: Jeani Hawking, MD;  Location[facility];  Service: Endoscopy;  Laterality: N/A;   HEMORRHOID SURGERY  1976   INGUINAL HERNIA REPAIR N/A 04/10/2021   Procedure: LAPAROSCOPIC LEFT INGUINAL HERNIA REPAIR AND RIGHT INGUINAL HERNII REPAIR WITH TAP BLOCK;  Surgeon: Karie Soda, MD;  Location: [facility];  Service: General;  Laterality: N/A;   ORIF ANKLE FRACTURE Left 08/30/2017   Procedure: OPEN REDUCTION INTERNAL FIXATION (ORIF) ANKLE FRACTURE;  Surgeon: Samson Frederic, MD;  Location: [facility];  Service: Orthopedics;  Laterality: Left;   TONSILLECTOMY     CHILD    Family History  Problem Relation Age of Onset   Colon polyps Mother    Muscular dystrophy Mother        late 83s   Hypertension Sister    Muscular dystrophy Maternal Grandmother        50   Heart block Maternal Grandmother    Colon cancer Neg Hx    Esophageal cancer Neg Hx    Rectal cancer Neg Hx    Stomach cancer Neg Hx     Social History   Socioeconomic History   Marital status: Divorced    Spouse name: Not on file   Number of children: Not on file   Years of education: Not on file   Highest education level: Associate degree: occupational, Scientist, product/process development, or vocational program  Occupational History   Not on file  Tobacco Use   Smoking status: Former    Current packs/day: 0.00    Average packs/day: 1 pack/day for 40.0 years (40.0 ttl pk-yrs)    Types: Cigarettes    Start date: 1964    Quit date: 2004    Years since quitting: 21.1    Passive exposure: Past    Smokeless tobacco: Never  Vaping Use   Vaping status: Never Used  Substance and Sexual Activity   Alcohol use: Yes    Comment: rare   Drug use: Never   Sexual activity: Not Currently    Birth control/protection: Post-menopausal  Other Topics Concern   Not on file  Social History Narrative   Not on file   Social Drivers of Health   Financial Resource Strain: Medium Risk (06/29/2023)   Overall Financial Resource Strain (CARDIA)    Difficulty of Paying Living Expenses: Somewhat hard  Food Insecurity: No Food Insecurity (06/29/2023)   Hunger Vital Sign    Worried About Running Out of Food in the Last Year: Never true    Ran Out of Food in the Last Year: Never true  Transportation Needs: No Transportation Needs (06/29/2023)   PRAPARE - Administrator, Civil Service (Medical): No    Lack of Transportation (Non-Medical): No  Physical Activity: Unknown (06/29/2023)   Exercise Vital Sign    Days of Exercise per Week: Patient declined    Minutes of Exercise per Session: Not on file  Recent Concern: Physical Activity - Insufficiently Active (04/28/2023)   [facility]   Exercise Vital Sign    Days of Exercise per Week: 2 days    Minutes of Exercise per Session: 20 min  Stress: Stress Concern Present (06/29/2023)   [employer] of Occupational Health - Occupational Stress Questionnaire    Feeling of Stress : Rather much  Social Connections: Socially Isolated (06/29/2023)   Social Connection and Isolation Panel [NHANES]    Frequency of Communication with Friends and Family: More than three times a week  Frequency of Social Gatherings with Friends and Family: Once a week    Attends Religious Services: Never    Database administrator or Organizations: No    Attends Engineer, structural: Not on file    Marital Status: Divorced  Intimate Partner Violence: Patient Declined (04/28/2023)   Received from [facility]   HITS    Over the last 12 months how often  did your partner physically hurt you?: Patient declined    Over the last 12 months how often did your partner insult you or talk down to you?: Patient declined    Over the last 12 months how often did your partner threaten you with physical harm?: Patient declined    Over the last 12 months how often did your partner scream or curse at you?: Patient declined    Objective   BP 118/67 (BP Location: Left Arm, Patient Position: Sitting, Cuff Size: Normal)   Pulse 71   Ht 5\' 3"  (1.6 m)   Wt 164 lb 3.2 oz (74.5 kg)   SpO2 98%   BMI 29.09 kg/m   Physical Exam  73 year old female in no acute distress Cardiovascular exam with regular rate and rhythm Lungs clear to auscultation bilaterally  Assessment & Plan:   Hypothyroidism, unspecified type Assessment & Plan: We will check TSH today to assess current status.  For now, can continue with levothyroxine dose.  Can plan to make adjustments based on lab results which should return tomorrow  Orders: -     TSH  Major depression, recurrent, full remission (HCC) Assessment & Plan: Can continue with current medication regimen.  Unfortunately, symptoms have been affected by ongoing issues related to bladder.  She does have upcoming procedure planned for next month.  Hopefully this does provide patient with relief of current symptoms   Return in about 3 months (around 10/01/2023) for hypothyroidism.    ___________________________________________ Yamina Lenis de [place], MD, ABFM, CAQSM Primary Care and Sports Medicine [facility]

## 2023-07-01 NOTE — Assessment & Plan Note (Signed)
 Can continue with current medication regimen.  Unfortunately, symptoms have been affected by ongoing issues related to bladder.  She does have upcoming procedure planned for next month.  Hopefully this does provide patient with relief of current symptoms

## 2023-07-01 NOTE — Assessment & Plan Note (Signed)
 We will check TSH today to assess current status.  For now, can continue with levothyroxine dose.  Can plan to make adjustments based on lab results which should return tomorrow

## 2023-07-02 LAB — TSH: TSH: 0.674 u[IU]/mL (ref 0.450–4.500)

## 2023-07-06 ENCOUNTER — Other Ambulatory Visit: Payer: Self-pay

## 2023-07-06 DIAGNOSIS — E782 Mixed hyperlipidemia: Secondary | ICD-10-CM

## 2023-07-06 DIAGNOSIS — I2510 Atherosclerotic heart disease of native coronary artery without angina pectoris: Secondary | ICD-10-CM

## 2023-07-06 MED ORDER — ROSUVASTATIN CALCIUM 5 MG PO TABS: ORAL_TABLET | ORAL | 3 refills | Status: DC

## 2023-07-07 ENCOUNTER — Encounter (HOSPITAL_BASED_OUTPATIENT_CLINIC_OR_DEPARTMENT_OTHER): Payer: Self-pay | Admitting: Family Medicine

## 2023-07-11 ENCOUNTER — Encounter (HOSPITAL_BASED_OUTPATIENT_CLINIC_OR_DEPARTMENT_OTHER): Payer: Self-pay | Admitting: Family Medicine

## 2023-07-16 ENCOUNTER — Other Ambulatory Visit: Payer: Self-pay

## 2023-07-16 ENCOUNTER — Emergency Department (HOSPITAL_BASED_OUTPATIENT_CLINIC_OR_DEPARTMENT_OTHER)
Admission: EM | Admit: 2023-07-16 | Discharge: 2023-07-17 | Disposition: A | Discharge status: Home / Self Care | Attending: Emergency Medicine | Admitting: Emergency Medicine

## 2023-07-16 ENCOUNTER — Encounter (HOSPITAL_BASED_OUTPATIENT_CLINIC_OR_DEPARTMENT_OTHER): Payer: Self-pay | Admitting: Emergency Medicine

## 2023-07-16 DIAGNOSIS — Z91040 Latex allergy status: Secondary | ICD-10-CM | POA: Diagnosis not present

## 2023-07-16 DIAGNOSIS — Z7982 Long term (current) use of aspirin: Secondary | ICD-10-CM | POA: Diagnosis not present

## 2023-07-16 DIAGNOSIS — N3000 Acute cystitis without hematuria: Secondary | ICD-10-CM | POA: Diagnosis not present

## 2023-07-16 DIAGNOSIS — N390 Urinary tract infection, site not specified: Secondary | ICD-10-CM | POA: Diagnosis present

## 2023-07-16 LAB — URINALYSIS, ROUTINE W REFLEX MICROSCOPIC
Bilirubin Urine: NEGATIVE
Glucose, UA: NEGATIVE mg/dL
Ketones, ur: NEGATIVE mg/dL
Nitrite: NEGATIVE
Protein, ur: 100 mg/dL — AB
Specific Gravity, Urine: 1.016 (ref 1.005–1.030)
WBC, UA: >50 WBC/hpf (ref 0–5)
pH: 6 (ref 5.0–8.0)

## 2023-07-16 MED ORDER — NITROFURANTOIN MONOHYD MACRO 100 MG PO CAPS: 100.0000 mg | ORAL_CAPSULE | Freq: Two times a day (BID) | ORAL | 0 refills | Status: DC

## 2023-07-16 MED ORDER — HYDROCODONE-ACETAMINOPHEN 5-325 MG PO TABS
1.0000 | ORAL_TABLET | Freq: Once | ORAL | Status: AC
  Administered 2023-07-17: 1 via ORAL
  Filled 2023-07-16: qty 1

## 2023-07-16 NOTE — ED Provider Notes (Signed)
 [place] EMERGENCY DEPARTMENT AT [facility] Provider Note   CSN: [id number] Arrival date & time: 07/16/23  2018     History  Chief Complaint  Patient presents with   Urinary Tract Infection    [name] is a 73 y.o. female.  Patient with history of interstitial cystitis, rectal prolapse, recurrent urinary tract infections presents with UTI.  She reports chronic pain from the prolapsed rectum and is pending surgery. She also has chronic bladder pain since botox injections last year. She regularly checks her urine with a test strip and reports this is the only way she knows whether she has an infection which she did today and found it to be positive. No fever, vomiting, flank pain, hematuria.   The history is provided by the patient. No language interpreter was used.  Urinary Tract Infection      Home Medications Prior to Admission medications   Medication Sig Start Date End Date Taking? Authorizing Provider  HYDROcodone-acetaminophen (NORCO/VICODIN) 5-325 MG tablet Take 1 tablet by mouth every 4 (four) hours as needed. 07/17/23  Yes Hanah Moultry, Melvenia Beam, PA-C  nitrofurantoin, macrocrystal-monohydrate, (MACROBID) 100 MG capsule Take 1 capsule (100 mg total) by mouth 2 (two) times daily. 07/16/23  Yes Elpidio Anis, PA-C  aspirin EC 81 MG tablet Take 81 mg by mouth daily.     [provider]  buPROPion (WELLBUTRIN XL) 150 MG 24 hr tablet Take 3 tablets (450 mg total) by mouth daily for depression. 06/08/23   Eulis Foster, FNP  cetirizine (ZYRTEC) 10 MG tablet Take 10 mg by mouth daily.    [provider]  Cholecalciferol (VITAMIN D-3) 5000 UNITS TABS Take 5,000 Units by mouth daily.     [provider]  diazepam (VALIUM) 5 MG tablet Place 1 tablet vaginally nightly as needed for muscle spasm/ pelvic pain. 03/16/23   Marguerita Beards, MD  estradiol (ESTRACE) 0.1 MG/GM vaginal cream PLACE 1/2 GRAM VAGINALLY EVERY NIGHT AT BEDTIME FOR 2 WEEKS  THEN USE 2 TIMES PER WEEK THEREAFTER 12/15/22   Marguerita Beards, MD  gabapentin (NEURONTIN) 600 MG tablet Take 1/2 to 1 tablet 2 to 3 x /Daily as needed for Pain 04/06/23   Lucky Cowboy, MD  guaifenesin (HUMIBID E) 400 MG TABS tablet Take 400 mg by mouth daily.    [provider]  hyoscyamine (LEVSIN SL) 0.125 MG SL tablet DISSOLVE 1 TABLET UNDER THE TONGUE EVERY 4 HOURS IF NEEDED FOR NAUSEA, BLOATING, CRAMPING, OR DIARRHEA 12/11/22   Raynelle Dick, NP  ipratropium-albuterol (DUONEB) 0.5-2.5 (3) MG/3ML SOLN USE 1 VIAL IN NEBULIZER EVERY 4 HOURS AS NEEDED **MAX 6 DOSES A DAY** 11/24/19   Judd Gaudier, NP  levothyroxine (SYNTHROID) 150 MCG tablet Take 1 tablet (150 mcg total) by mouth daily before breakfast. 06/08/23   Worthy Rancher B, FNP  lidocaine-prilocaine (EMLA) cream Apply 1 Application topically as needed. 10/04/22   Zuleta, Joan Mayans, NP  ondansetron (ZOFRAN) 8 MG tablet TAKE 1 TABLET BY MOUTH 3 TIMES A DAY AS NEEDED FOR NAUSEA Patient taking differently: Take 8 mg by mouth every 8 (eight) hours as needed. TAKE 1 TABLET BY MOUTH 3 TIMES A DAY AS NEEDED FOR NAUSEA 10/02/20   Judd Gaudier, NP  pantoprazole (PROTONIX) 20 MG tablet Take 1 tablet (20 mg total) by mouth daily. 05/31/23   Adela Glimpse, NP  rosuvastatin (CRESTOR) 5 MG tablet Take  1 tablet 3 x /week for Cholesterol                                                         /  TAKE                                     BY                                  MOUTH 07/06/23   Worthy Rancher B, FNP  traZODone (DESYREL) 50 MG tablet Take 50 mg by mouth at bedtime.    [provider]      Allergies    Ace inhibitors, Cephalexin, Dilaudid [hydromorphone hcl], Robaxin [methocarbamol], Macrobid [nitrofurantoin], Levaquin [levofloxacin], Ciprofloxacin, Codeine, and Latex    Review of Systems   Review of Systems  Physical Exam Updated Vital Signs BP 113/83   Pulse 88   Temp 98.2 F (36.8 C)   Resp  20   Wt 77.1 kg   SpO2 96%   BMI 30.11 kg/m  Physical Exam Constitutional:      General: She is not in acute distress.    Appearance: She is well-developed. She is not ill-appearing.  Cardiovascular:     Rate and Rhythm: Normal rate.  Pulmonary:     Effort: Pulmonary effort is normal.  Abdominal:     General: There is no distension.     Palpations: Abdomen is soft.     Tenderness: There is no right CVA tenderness or left CVA tenderness.  Musculoskeletal:        General: Normal range of motion.     Cervical back: Normal range of motion.  Skin:    General: Skin is warm and dry.  Neurological:     Mental Status: She is alert and oriented to person, place, and time.     ED Results / Procedures / Treatments   Labs (all labs ordered are listed, but only abnormal results are displayed) Labs Reviewed  URINALYSIS, ROUTINE W REFLEX MICROSCOPIC - Abnormal; Notable for the following components:      Result Value   APPearance CLOUDY (*)    Hgb urine dipstick MODERATE (*)    Protein, ur 100 (*)    Leukocytes,Ua LARGE (*)    Bacteria, UA FEW (*)    All other components within normal limits    EKG None  Radiology No results found.  Procedures Procedures    Medications Ordered in ED Medications  HYDROcodone-acetaminophen (NORCO/VICODIN) 5-325 MG per tablet 1 tablet (1 tablet Oral Given 07/17/23 0003)  nitrofurantoin (macrocrystal-monohydrate) (MACROBID) capsule 100 mg (100 mg Oral Given 07/17/23 0020)    ED Course/ Medical Decision Making/ A&P Clinical Course as of 07/17/23 0024  Sat Jul 17, 2023  0022 Patient with evidence of UTI on UA tonight. Afebrile, appears nontoxic. Chart reviewed showing previous positive urine cultures with sensitivity to Macrobid.  She has multiple allergies to antibiotics but reports she can take Macrobid without significant problems. Abx prescribed, Norco for pain.  [SU]    Clinical Course User Index [SU] Elpidio Anis, PA-C                                  Medical Decision Making Risk Prescription drug management.           Final Clinical Impression(s) / ED Diagnoses Final diagnoses:  Acute cystitis without hematuria    Rx / DC Orders ED Discharge Orders  Ordered    HYDROcodone-acetaminophen (NORCO/VICODIN) 5-325 MG tablet  Every 4 hours PRN        07/17/23 0006    nitrofurantoin, macrocrystal-monohydrate, (MACROBID) 100 MG capsule  2 times daily        07/16/23 2356              Elpidio Anis, PA-C 07/17/23 0025    Wilkie Aye Mayer Masker, MD 07/17/23 [phone]

## 2023-07-16 NOTE — ED Triage Notes (Signed)
 Patient endorses UTI symptoms, oliguria, pelvic pain x 2 days.

## 2023-07-17 MED ORDER — HYDROCODONE-ACETAMINOPHEN 5-325 MG PO TABS: 1.0000 | ORAL_TABLET | ORAL | 0 refills | Status: DC | PRN

## 2023-07-17 MED ORDER — NITROFURANTOIN MONOHYD MACRO 100 MG PO CAPS
100.0000 mg | ORAL_CAPSULE | Freq: Once | ORAL | Status: AC
  Administered 2023-07-17: 100 mg via ORAL
  Filled 2023-07-17: qty 1

## 2023-07-17 NOTE — Discharge Instructions (Signed)
 Take the medications as prescribed for the urinary tract infection. Take Norco (hydrocodone) for pain as needed.   Return to the ED if you develop any fever, vomiting or new symptoms of concern.

## 2023-07-22 DIAGNOSIS — R102 Pelvic and perineal pain: Secondary | ICD-10-CM | POA: Diagnosis not present

## 2023-07-22 DIAGNOSIS — G8929 Other chronic pain: Secondary | ICD-10-CM | POA: Diagnosis not present

## 2023-07-22 DIAGNOSIS — N3010 Interstitial cystitis (chronic) without hematuria: Secondary | ICD-10-CM | POA: Diagnosis not present

## 2023-07-26 ENCOUNTER — Other Ambulatory Visit (HOSPITAL_BASED_OUTPATIENT_CLINIC_OR_DEPARTMENT_OTHER): Payer: Self-pay | Admitting: Family Medicine

## 2023-07-26 ENCOUNTER — Encounter (HOSPITAL_BASED_OUTPATIENT_CLINIC_OR_DEPARTMENT_OTHER): Payer: Self-pay | Admitting: Family Medicine

## 2023-07-26 DIAGNOSIS — I10 Essential (primary) hypertension: Secondary | ICD-10-CM | POA: Diagnosis not present

## 2023-07-26 DIAGNOSIS — Z8673 Personal history of transient ischemic attack (TIA), and cerebral infarction without residual deficits: Secondary | ICD-10-CM | POA: Diagnosis not present

## 2023-07-26 DIAGNOSIS — K589 Irritable bowel syndrome without diarrhea: Secondary | ICD-10-CM | POA: Diagnosis not present

## 2023-07-26 DIAGNOSIS — I5030 Unspecified diastolic (congestive) heart failure: Secondary | ICD-10-CM | POA: Diagnosis not present

## 2023-07-26 DIAGNOSIS — N189 Chronic kidney disease, unspecified: Secondary | ICD-10-CM | POA: Diagnosis not present

## 2023-07-26 DIAGNOSIS — Z01818 Encounter for other preprocedural examination: Secondary | ICD-10-CM | POA: Diagnosis not present

## 2023-07-26 DIAGNOSIS — Z01810 Encounter for preprocedural cardiovascular examination: Secondary | ICD-10-CM | POA: Diagnosis not present

## 2023-07-26 DIAGNOSIS — G459 Transient cerebral ischemic attack, unspecified: Secondary | ICD-10-CM | POA: Diagnosis not present

## 2023-07-26 DIAGNOSIS — E782 Mixed hyperlipidemia: Secondary | ICD-10-CM | POA: Diagnosis not present

## 2023-07-26 DIAGNOSIS — I5031 Acute diastolic (congestive) heart failure: Secondary | ICD-10-CM | POA: Diagnosis not present

## 2023-07-26 DIAGNOSIS — D631 Anemia in chronic kidney disease: Secondary | ICD-10-CM | POA: Diagnosis not present

## 2023-07-26 DIAGNOSIS — K623 Rectal prolapse: Secondary | ICD-10-CM | POA: Diagnosis not present

## 2023-07-26 DIAGNOSIS — N3010 Interstitial cystitis (chronic) without hematuria: Secondary | ICD-10-CM | POA: Diagnosis not present

## 2023-07-26 DIAGNOSIS — I130 Hypertensive heart and chronic kidney disease with heart failure and stage 1 through stage 4 chronic kidney disease, or unspecified chronic kidney disease: Secondary | ICD-10-CM | POA: Diagnosis not present

## 2023-07-26 DIAGNOSIS — Z7982 Long term (current) use of aspirin: Secondary | ICD-10-CM | POA: Diagnosis not present

## 2023-07-26 DIAGNOSIS — E039 Hypothyroidism, unspecified: Secondary | ICD-10-CM | POA: Diagnosis not present

## 2023-07-26 DIAGNOSIS — Z79890 Hormone replacement therapy (postmenopausal): Secondary | ICD-10-CM | POA: Diagnosis not present

## 2023-07-26 DIAGNOSIS — N1831 Chronic kidney disease, stage 3a: Secondary | ICD-10-CM | POA: Diagnosis not present

## 2023-07-26 DIAGNOSIS — J449 Chronic obstructive pulmonary disease, unspecified: Secondary | ICD-10-CM | POA: Diagnosis not present

## 2023-07-26 DIAGNOSIS — K581 Irritable bowel syndrome with constipation: Secondary | ICD-10-CM | POA: Diagnosis not present

## 2023-07-26 DIAGNOSIS — D649 Anemia, unspecified: Secondary | ICD-10-CM | POA: Diagnosis not present

## 2023-07-26 DIAGNOSIS — Z01812 Encounter for preprocedural laboratory examination: Secondary | ICD-10-CM | POA: Diagnosis not present

## 2023-07-26 DIAGNOSIS — K219 Gastro-esophageal reflux disease without esophagitis: Secondary | ICD-10-CM | POA: Diagnosis not present

## 2023-07-26 DIAGNOSIS — D509 Iron deficiency anemia, unspecified: Secondary | ICD-10-CM | POA: Diagnosis not present

## 2023-07-26 DIAGNOSIS — Z79899 Other long term (current) drug therapy: Secondary | ICD-10-CM | POA: Diagnosis not present

## 2023-07-26 DIAGNOSIS — E785 Hyperlipidemia, unspecified: Secondary | ICD-10-CM | POA: Diagnosis not present

## 2023-07-26 DIAGNOSIS — Z87891 Personal history of nicotine dependence: Secondary | ICD-10-CM | POA: Diagnosis not present

## 2023-07-27 DIAGNOSIS — D509 Iron deficiency anemia, unspecified: Secondary | ICD-10-CM | POA: Insufficient documentation

## 2023-07-28 DIAGNOSIS — N3010 Interstitial cystitis (chronic) without hematuria: Secondary | ICD-10-CM | POA: Diagnosis not present

## 2023-07-28 DIAGNOSIS — N390 Urinary tract infection, site not specified: Secondary | ICD-10-CM | POA: Diagnosis not present

## 2023-07-28 DIAGNOSIS — K623 Rectal prolapse: Secondary | ICD-10-CM | POA: Diagnosis not present

## 2023-07-28 DIAGNOSIS — G8929 Other chronic pain: Secondary | ICD-10-CM | POA: Diagnosis not present

## 2023-07-28 DIAGNOSIS — R102 Pelvic and perineal pain: Secondary | ICD-10-CM | POA: Diagnosis not present

## 2023-07-29 ENCOUNTER — Other Ambulatory Visit: Payer: Self-pay

## 2023-07-29 ENCOUNTER — Other Ambulatory Visit (HOSPITAL_COMMUNITY): Payer: Self-pay

## 2023-07-29 MED ORDER — TRAZODONE HCL 50 MG PO TABS
50.0000 mg | ORAL_TABLET | Freq: Every day | ORAL | 1 refills | Status: DC
  Filled 2023-07-29 (×2): qty 90, 90d supply, fill #0
  Filled 2023-10-26: qty 90, 90d supply, fill #1

## 2023-08-02 DIAGNOSIS — D509 Iron deficiency anemia, unspecified: Secondary | ICD-10-CM | POA: Diagnosis not present

## 2023-08-02 DIAGNOSIS — R8281 Pyuria: Secondary | ICD-10-CM | POA: Diagnosis not present

## 2023-08-04 ENCOUNTER — Ambulatory Visit (HOSPITAL_BASED_OUTPATIENT_CLINIC_OR_DEPARTMENT_OTHER): Payer: PPO | Admitting: Family Medicine

## 2023-08-09 DIAGNOSIS — Z881 Allergy status to other antibiotic agents status: Secondary | ICD-10-CM | POA: Diagnosis not present

## 2023-08-09 DIAGNOSIS — Z7982 Long term (current) use of aspirin: Secondary | ICD-10-CM | POA: Diagnosis not present

## 2023-08-09 DIAGNOSIS — J449 Chronic obstructive pulmonary disease, unspecified: Secondary | ICD-10-CM | POA: Diagnosis not present

## 2023-08-09 DIAGNOSIS — E039 Hypothyroidism, unspecified: Secondary | ICD-10-CM | POA: Diagnosis not present

## 2023-08-09 DIAGNOSIS — Z8673 Personal history of transient ischemic attack (TIA), and cerebral infarction without residual deficits: Secondary | ICD-10-CM | POA: Diagnosis not present

## 2023-08-09 DIAGNOSIS — D631 Anemia in chronic kidney disease: Secondary | ICD-10-CM | POA: Diagnosis not present

## 2023-08-09 DIAGNOSIS — K623 Rectal prolapse: Secondary | ICD-10-CM | POA: Diagnosis not present

## 2023-08-09 DIAGNOSIS — Z91040 Latex allergy status: Secondary | ICD-10-CM | POA: Diagnosis not present

## 2023-08-09 DIAGNOSIS — F32A Depression, unspecified: Secondary | ICD-10-CM | POA: Diagnosis not present

## 2023-08-09 DIAGNOSIS — K219 Gastro-esophageal reflux disease without esophagitis: Secondary | ICD-10-CM | POA: Diagnosis not present

## 2023-08-09 DIAGNOSIS — Z87891 Personal history of nicotine dependence: Secondary | ICD-10-CM | POA: Diagnosis not present

## 2023-08-09 DIAGNOSIS — I129 Hypertensive chronic kidney disease with stage 1 through stage 4 chronic kidney disease, or unspecified chronic kidney disease: Secondary | ICD-10-CM | POA: Diagnosis not present

## 2023-08-09 DIAGNOSIS — Z885 Allergy status to narcotic agent status: Secondary | ICD-10-CM | POA: Diagnosis not present

## 2023-08-09 DIAGNOSIS — N3010 Interstitial cystitis (chronic) without hematuria: Secondary | ICD-10-CM | POA: Diagnosis not present

## 2023-08-09 DIAGNOSIS — Z79890 Hormone replacement therapy (postmenopausal): Secondary | ICD-10-CM | POA: Diagnosis not present

## 2023-08-09 DIAGNOSIS — N189 Chronic kidney disease, unspecified: Secondary | ICD-10-CM | POA: Diagnosis not present

## 2023-08-09 DIAGNOSIS — F419 Anxiety disorder, unspecified: Secondary | ICD-10-CM | POA: Diagnosis not present

## 2023-08-09 DIAGNOSIS — Z882 Allergy status to sulfonamides status: Secondary | ICD-10-CM | POA: Diagnosis not present

## 2023-08-19 DIAGNOSIS — N3010 Interstitial cystitis (chronic) without hematuria: Secondary | ICD-10-CM | POA: Diagnosis not present

## 2023-08-19 DIAGNOSIS — G8929 Other chronic pain: Secondary | ICD-10-CM | POA: Diagnosis not present

## 2023-08-19 DIAGNOSIS — R8281 Pyuria: Secondary | ICD-10-CM | POA: Diagnosis not present

## 2023-08-19 DIAGNOSIS — R102 Pelvic and perineal pain: Secondary | ICD-10-CM | POA: Diagnosis not present

## 2023-08-31 ENCOUNTER — Other Ambulatory Visit (HOSPITAL_BASED_OUTPATIENT_CLINIC_OR_DEPARTMENT_OTHER): Payer: Self-pay | Admitting: *Deleted

## 2023-08-31 MED ORDER — GABAPENTIN 600 MG PO TABS: ORAL_TABLET | ORAL | 1 refills | Status: AC

## 2023-09-01 DIAGNOSIS — N390 Urinary tract infection, site not specified: Secondary | ICD-10-CM | POA: Diagnosis not present

## 2023-09-03 DIAGNOSIS — N390 Urinary tract infection, site not specified: Secondary | ICD-10-CM | POA: Diagnosis not present

## 2023-09-06 DIAGNOSIS — N390 Urinary tract infection, site not specified: Secondary | ICD-10-CM | POA: Diagnosis not present

## 2023-09-09 ENCOUNTER — Other Ambulatory Visit (HOSPITAL_BASED_OUTPATIENT_CLINIC_OR_DEPARTMENT_OTHER): Payer: Self-pay | Admitting: Family Medicine

## 2023-09-09 MED ORDER — LEVOTHYROXINE SODIUM 150 MCG PO TABS
150.0000 ug | ORAL_TABLET | Freq: Every day | ORAL | 0 refills | Status: DC
Start: 2023-09-09 — End: 2023-12-07

## 2023-09-09 NOTE — Telephone Encounter (Signed)
 Copied from CRM [id number]. Topic: Clinical - Medication Refill >> Sep 09, 2023  9:23 AM Ivette P wrote: Medication: levothyroxine  (SYNTHROID ) 150 MCG tablet  Has the patient contacted their pharmacy? Yes (Agent: If no, request that the patient contact the pharmacy for the refill. If patient does not wish to contact the pharmacy document the reason why and proceed with request.) (Agent: If yes, when and what did the pharmacy advise?)  This is the patient's preferred pharmacy:  [facility] [id number] - [place], [place] - [address] [place] [address] Phone: [phone] Fax: [phone]   Is this the correct pharmacy for this prescription? Yes If no, delete pharmacy and type the correct one.   Has the prescription been filled recently? No, 06/08/2023  Is the patient out of the medication? Yes, pt has 3 pills left.   Has the patient been seen for an appointment in the last year OR does the patient have an upcoming appointment? Yes  Can we respond through MyChart? Yes  Agent: Please be advised that Rx refills may take up to 3 business days. We ask that you follow-up with your pharmacy.

## 2023-09-15 ENCOUNTER — Ambulatory Visit: Payer: PPO | Admitting: Nurse Practitioner

## 2023-09-22 ENCOUNTER — Ambulatory Visit: Payer: PPO | Admitting: Nurse Practitioner

## 2023-09-27 DIAGNOSIS — N390 Urinary tract infection, site not specified: Secondary | ICD-10-CM | POA: Diagnosis not present

## 2023-09-27 DIAGNOSIS — N183 Chronic kidney disease, stage 3 unspecified: Secondary | ICD-10-CM | POA: Diagnosis not present

## 2023-09-27 DIAGNOSIS — I129 Hypertensive chronic kidney disease with stage 1 through stage 4 chronic kidney disease, or unspecified chronic kidney disease: Secondary | ICD-10-CM | POA: Diagnosis not present

## 2023-10-04 ENCOUNTER — Ambulatory Visit (HOSPITAL_BASED_OUTPATIENT_CLINIC_OR_DEPARTMENT_OTHER): Admitting: Family Medicine

## 2023-10-04 ENCOUNTER — Ambulatory Visit (HOSPITAL_BASED_OUTPATIENT_CLINIC_OR_DEPARTMENT_OTHER): Payer: Self-pay | Admitting: Family Medicine

## 2023-10-04 ENCOUNTER — Encounter (HOSPITAL_BASED_OUTPATIENT_CLINIC_OR_DEPARTMENT_OTHER): Payer: Self-pay | Admitting: Family Medicine

## 2023-10-04 ENCOUNTER — Telehealth (HOSPITAL_BASED_OUTPATIENT_CLINIC_OR_DEPARTMENT_OTHER): Payer: Self-pay | Admitting: Family Medicine

## 2023-10-04 VITALS — BP 94/67 | HR 88 | Ht 63.0 in | Wt 149.8 lb

## 2023-10-04 DIAGNOSIS — R300 Dysuria: Secondary | ICD-10-CM | POA: Insufficient documentation

## 2023-10-04 DIAGNOSIS — E039 Hypothyroidism, unspecified: Secondary | ICD-10-CM | POA: Diagnosis not present

## 2023-10-04 DIAGNOSIS — K580 Irritable bowel syndrome with diarrhea: Secondary | ICD-10-CM | POA: Diagnosis not present

## 2023-10-04 DIAGNOSIS — D509 Iron deficiency anemia, unspecified: Secondary | ICD-10-CM

## 2023-10-04 HISTORY — DX: Dysuria: R30.0

## 2023-10-04 LAB — POCT URINALYSIS DIP (CLINITEK)
Bilirubin, UA: NEGATIVE
Glucose, UA: NEGATIVE mg/dL
Ketones, POC UA: NEGATIVE mg/dL
Nitrite, UA: NEGATIVE
POC PROTEIN,UA: 30 — AB
Spec Grav, UA: 1.015 (ref 1.010–1.025)
Urobilinogen, UA: 0.2 U/dL
pH, UA: 5.5 (ref 5.0–8.0)

## 2023-10-04 MED ORDER — FLUCONAZOLE 150 MG PO TABS: 150.0000 mg | ORAL_TABLET | Freq: Once | ORAL | 0 refills | Status: AC

## 2023-10-04 MED ORDER — FOSFOMYCIN TROMETHAMINE 3 G PO PACK: 3.0000 g | PACK | Freq: Once | ORAL | 0 refills | Status: AC

## 2023-10-04 MED ORDER — HYOSCYAMINE SULFATE 0.125 MG SL SUBL: SUBLINGUAL_TABLET | SUBLINGUAL | 1 refills | Status: DC

## 2023-10-04 NOTE — Progress Notes (Unsigned)
    Procedures performed today:    None.  Independent interpretation of notes and tests performed by another provider:   None.  Brief History, Exam, Impression, and Recommendations:    BP 94/67 (BP Location: Right Arm, Patient Position: Sitting, Cuff Size: Normal)   Pulse 88   Ht 5\' 3"  (1.6 m)   Wt 149 lb 12.8 oz (67.9 kg)   SpO2 95%   BMI 26.54 kg/m   Dysuria Assessment & Plan: Acute on chronic issue for patient.  She reports experiencing some dysuria over the past couple weeks.  She has not had any associated fever or chills.  No new abdominal pain, no change in back pain.  She previously has been following with urogynecologist, has not followed up with Dr. Frutoso Jing who she was seeing locally, did see provider associated with [facility] due to simultaneous surgical procedures.  She reports that she is now in between providers after being "released" by [facility] provider and has not scheduled appointment with Dr. Frutoso Jing locally. Discussed considerations today.  Can proceed with urine testing at this time and we will manage accordingly based on results.  She also is requesting prescription for fluconazole  as she feels that she has some associated itching and that this would likely represent ongoing fungal issue for her and "women know when they have yeast infection".  She currently denies any discharge. Recommend that she arrange for follow-up with your gynecologist given prior issues with UTIs as well as interstitial cystitis.  She indicates that she will reach out to Dr. Sunnie England office to schedule this follow-up   Iron deficiency anemia, unspecified iron deficiency anemia type Assessment & Plan: Noted on labs recently, was more severe around time of recent surgery/procedures.  She did receive 1 IV iron infusion perioperatively.  She feels that she has been eating better after the surgery and thus thinks that her numbers will be improved.  We did discuss possibility of iron  supplementation as well as possible referral to hematology if still with notable anemia and iron deficiency.  She indicates that she does not tolerate oral iron supplementation and feels that referral to hematology will not be necessary as she thinks that numbers will be greatly improved with repeat of labs  Orders: -     CBC with Differential/Platelet -     Iron, TIBC and Ferritin Panel  Hypothyroidism, unspecified type Assessment & Plan: Previously has been having adjustment in levothyroxine  dose with gradual improvement in TSH.  Most recent TSH was within normal range, however at lower end.  We will proceed with recheck of labs today for monitoring.  Consider dose reduction if TSH remains near lower limit of normal.  Orders: -     TSH  Other orders -     Fluconazole ; Take 1 tablet (150 mg total) by mouth once for 1 dose.  Dispense: 1 tablet; Refill: 0  Return in about 4 months (around 02/03/2024).   ___________________________________________ Lorra Freeman de [place], MD, ABFM, CAQSM Primary Care and Sports Medicine [facility]

## 2023-10-04 NOTE — Patient Instructions (Signed)
   Medication Instructions:  Your physician recommends that you continue on your current medications as directed. Please refer to the Current Medication list given to you today. --If you need a refill on any your medications before your next appointment, please call your pharmacy first. If no refills are authorized on file call the office.-- Lab Work: Your physician has recommended that you have lab work today: today If you have labs (blood work) drawn today and your tests are completely normal, you will receive your results via MyChart message OR a phone call from our staff.  Please ensure you check your voicemail in the event that you authorized detailed messages to be left on a delegated number. If you have any lab test that is abnormal or we need to change your treatment, we will call you to review the results.    Follow-Up: Your next appointment:   Your physician recommends that you schedule a follow-up appointment in: 3-4 month follow up  with Dr. de [place]  You will receive a text message or e-mail with a link to a survey about your care and experience with [place] today! We would greatly appreciate your feedback!   Thanks for letting us be apart of your health journey!!  Primary Care and Sports Medicine   Dr. Ceasar Mons [place]   We encourage you to activate your patient portal called "MyChart".  Sign up information is provided on this After Visit Summary.  MyChart is used to connect with patients for Virtual Visits (Telemedicine).  Patients are able to view lab/test results, encounter notes, upcoming appointments, etc.  Non-urgent messages can be sent to your provider as well. To learn more about what you can do with MyChart, please visit --  [email or web address].

## 2023-10-04 NOTE — Telephone Encounter (Signed)
 Patient came back after the appointment and forgot to mention she needs a refill on hyoscynamine and diflucan  she already called the pharmacy and they said they are waiting on the doctors approval. She would like to get them picked up today.

## 2023-10-04 NOTE — Assessment & Plan Note (Signed)
 Noted on labs recently, was more severe around time of recent surgery/procedures.  She did receive 1 IV iron infusion perioperatively.  She feels that she has been eating better after the surgery and thus thinks that her numbers will be improved.  We did discuss possibility of iron supplementation as well as possible referral to hematology if still with notable anemia and iron deficiency.  She indicates that she does not tolerate oral iron supplementation and feels that referral to hematology will not be necessary as she thinks that numbers will be greatly improved with repeat of labs

## 2023-10-04 NOTE — Assessment & Plan Note (Signed)
 Acute on chronic issue for patient.  She reports experiencing some dysuria over the past couple weeks.  She has not had any associated fever or chills.  No new abdominal pain, no change in back pain.  She previously has been following with urogynecologist, has not followed up with Dr. Frutoso Jing who she was seeing locally, did see provider associated with [facility] due to simultaneous surgical procedures.  She reports that she is now in between providers after being "released" by [facility] provider and has not scheduled appointment with Dr. Frutoso Jing locally. Discussed considerations today.  Can proceed with urine testing at this time and we will manage accordingly based on results.  She also is requesting prescription for fluconazole  as she feels that she has some associated itching and that this would likely represent ongoing fungal issue for her and "women know when they have yeast infection".  She currently denies any discharge. Recommend that she arrange for follow-up with your gynecologist given prior issues with UTIs as well as interstitial cystitis.  She indicates that she will reach out to Dr. Sunnie England office to schedule this follow-up

## 2023-10-04 NOTE — Assessment & Plan Note (Signed)
 Previously has been having adjustment in levothyroxine  dose with gradual improvement in TSH.  Most recent TSH was within normal range, however at lower end.  We will proceed with recheck of labs today for monitoring.  Consider dose reduction if TSH remains near lower limit of normal.

## 2023-10-05 LAB — TSH: TSH: 0.054 u[IU]/mL — ABNORMAL LOW (ref 0.450–4.500)

## 2023-10-05 LAB — CBC WITH DIFFERENTIAL/PLATELET
Basophils Absolute: 0 10*3/uL (ref 0.0–0.2)
Basos: 1 %
EOS (ABSOLUTE): 0.4 10*3/uL (ref 0.0–0.4)
Eos: 7 %
Hematocrit: 40 % (ref 34.0–46.6)
Hemoglobin: 12.8 g/dL (ref 11.1–15.9)
Immature Grans (Abs): 0 10*3/uL (ref 0.0–0.1)
Immature Granulocytes: 0 %
Lymphocytes Absolute: 1.4 10*3/uL (ref 0.7–3.1)
Lymphs: 27 %
MCH: 28.9 pg (ref 26.6–33.0)
MCHC: 32 g/dL (ref 31.5–35.7)
MCV: 90 fL (ref 79–97)
Monocytes Absolute: 0.4 10*3/uL (ref 0.1–0.9)
Monocytes: 8 %
Neutrophils Absolute: 3 10*3/uL (ref 1.4–7.0)
Neutrophils: 57 %
Platelets: 318 10*3/uL (ref 150–450)
RBC: 4.43 x10E6/uL (ref 3.77–5.28)
RDW: 17.1 % — ABNORMAL HIGH (ref 11.7–15.4)
WBC: 5.2 10*3/uL (ref 3.4–10.8)

## 2023-10-05 LAB — IRON,TIBC AND FERRITIN PANEL
Ferritin: 54 ng/mL (ref 15–150)
Iron Saturation: 27 % (ref 15–55)
Iron: 76 ug/dL (ref 27–139)
Total Iron Binding Capacity: 286 ug/dL (ref 250–450)
UIBC: 210 ug/dL (ref 118–369)

## 2023-10-05 NOTE — Telephone Encounter (Signed)
 Dr. De Peru, please see mychart sent by pt and advise.

## 2023-10-06 LAB — URINE CULTURE

## 2023-10-07 ENCOUNTER — Ambulatory Visit: Payer: Self-pay

## 2023-10-07 ENCOUNTER — Encounter (HOSPITAL_BASED_OUTPATIENT_CLINIC_OR_DEPARTMENT_OTHER): Payer: Self-pay | Admitting: *Deleted

## 2023-10-07 NOTE — Telephone Encounter (Signed)
 Mychart msg sent

## 2023-10-07 NOTE — Telephone Encounter (Signed)
 FYI Only or Action Required?: Action required by provider  Patient was last seen in primary care on 10/04/2023 by de Peru, Alonza Jansky, MD. Called Nurse Triage reporting Results. Symptoms began today. Interventions attempted: Nothing. Symptoms are: unchanged.  Triage Disposition: Home Care  Patient/caregiver understands and will follow disposition?: No, wishes to speak with PCP   Copied from CRM [id number] [id number]. Topic: Clinical - Lab/Test Results >> Oct 07, 2023  8:22 AM [name] wrote: Reason for CRM: Lab results Reason for Disposition  Nursing judgment or information in reference  Answer Assessment - Initial Assessment Questions 1. REASON FOR CALL: What is your main concern right now?     Patient states per her research, the antibiotic she is on is not appropriate for her UTI and would like a different antibiotic.  Protocols used: No Guideline Available-A-AH

## 2023-10-08 MED ORDER — SULFAMETHOXAZOLE-TRIMETHOPRIM 800-160 MG PO TABS: 1.0000 | ORAL_TABLET | Freq: Two times a day (BID) | ORAL | 0 refills | Status: AC

## 2023-10-20 ENCOUNTER — Encounter (HOSPITAL_BASED_OUTPATIENT_CLINIC_OR_DEPARTMENT_OTHER): Payer: Self-pay

## 2023-10-20 ENCOUNTER — Telehealth (HOSPITAL_BASED_OUTPATIENT_CLINIC_OR_DEPARTMENT_OTHER): Payer: Self-pay | Admitting: *Deleted

## 2023-10-20 ENCOUNTER — Ambulatory Visit (HOSPITAL_BASED_OUTPATIENT_CLINIC_OR_DEPARTMENT_OTHER): Admitting: *Deleted

## 2023-10-20 DIAGNOSIS — Z1231 Encounter for screening mammogram for malignant neoplasm of breast: Secondary | ICD-10-CM | POA: Diagnosis not present

## 2023-10-20 DIAGNOSIS — Z0000 Encounter for general adult medical examination without abnormal findings: Secondary | ICD-10-CM

## 2023-10-20 DIAGNOSIS — Z0100 Encounter for examination of eyes and vision without abnormal findings: Secondary | ICD-10-CM

## 2023-10-20 DIAGNOSIS — Z780 Asymptomatic menopausal state: Secondary | ICD-10-CM | POA: Diagnosis not present

## 2023-10-20 NOTE — Telephone Encounter (Signed)
 Patient still having pain when she is urinating. Does have appt with urogyn 7/9 didn't know if there was something she could do in the mean time

## 2023-10-20 NOTE — Progress Notes (Signed)
 Subjective:   [name] is a 73 y.o. female who presents for Medicare Annual (Subsequent) preventive examination.  Visit Complete: Virtual I connected with  [name] on 10/20/23 by a audio enabled telemedicine application and verified that I am speaking with the correct person using two identifiers.  Patient Location: Home  Provider Location: Home Office  I discussed the limitations of evaluation and management by telemedicine. The patient expressed understanding and agreed to proceed.  Vital Signs: Because this visit was a virtual/telehealth visit, some criteria may be missing or patient reported. Any vitals not documented were not able to be obtained and vitals that have been documented are patient reported.  Patient Medicare AWV questionnaire was completed by the patient on 10/20/23; I have confirmed that all information answered by patient is correct and no changes since this date.  Cardiac Risk Factors include: advanced age (>73men, >40 women);sedentary lifestyle     Objective:    Today's Vitals   10/20/23 1112  PainSc: 5    There is no height or weight on file to calculate BMI.     10/20/2023   11:24 AM 07/16/2023    8:28 PM 04/05/2023    8:04 AM 04/02/2023   12:14 PM 02/19/2023   10:20 AM 12/24/2022    1:34 PM 09/15/2022   12:57 PM  Advanced Directives  Does Patient Have a Medical Advance Directive? No No No No Yes Yes Yes  Type of Agricultural consultant;Living will Healthcare Power of Attorney Living will  Does patient want to make changes to medical advance directive?     No - Patient declined    Copy of Healthcare Power of Attorney in Chart?     No - copy requested    Would patient like information on creating a medical advance directive? No - Patient declined          Current Medications (verified) Outpatient Encounter Medications as of 10/20/2023  Medication Sig   aspirin  EC 81 MG tablet Take 81 mg by mouth daily.     buPROPion  (WELLBUTRIN  XL) 150 MG 24 hr tablet Take 3 tablets (450 mg total) by mouth daily for depression.   Cholecalciferol (VITAMIN D -3) 5000 UNITS TABS Take 5,000 Units by mouth daily.    diazepam  (VALIUM ) 5 MG tablet Place 1 tablet vaginally nightly as needed for muscle spasm/ pelvic pain.   estradiol  (ESTRACE ) 0.1 MG/GM vaginal cream PLACE 1/2 GRAM VAGINALLY EVERY NIGHT AT BEDTIME FOR 2 WEEKS THEN USE 2 TIMES PER WEEK THEREAFTER   gabapentin  (NEURONTIN ) 600 MG tablet Take 1/2 to 1 tablet 2 to 3 x /Daily as needed for Pain   guaifenesin  (HUMIBID E) 400 MG TABS tablet Take 400 mg by mouth daily.   hydrOXYzine  (ATARAX ) 25 MG tablet Take 25 mg by mouth 3 (three) times daily.   hyoscyamine  (LEVSIN  SL) 0.125 MG SL tablet DISSOLVE 1 TABLET UNDER THE TONGUE EVERY 4 HOURS IF NEEDED FOR NAUSEA, BLOATING, CRAMPING, OR DIARRHEA   ipratropium-albuterol  (DUONEB) 0.5-2.5 (3) MG/3ML SOLN USE 1 VIAL IN NEBULIZER EVERY 4 HOURS AS NEEDED **MAX 6 DOSES A DAY**   levothyroxine  (SYNTHROID ) 150 MCG tablet Take 1 tablet (150 mcg total) by mouth daily before breakfast.   lidocaine -prilocaine  (EMLA ) cream Apply 1 Application topically as needed.   ondansetron  (ZOFRAN ) 8 MG tablet TAKE 1 TABLET BY MOUTH 3 TIMES A DAY AS NEEDED FOR NAUSEA   pantoprazole  (PROTONIX ) 20 MG tablet Take 1 tablet (  20 mg total) by mouth daily.   traZODone  (DESYREL ) 50 MG tablet Take 1 tablet (50 mg total) by mouth at bedtime.   rosuvastatin  (CRESTOR ) 5 MG tablet Take  1 tablet 3 x /week for Cholesterol                                                         /                    TAKE                                     BY                                  MOUTH (Patient not taking: Reported on 10/20/2023)   No facility-administered encounter medications on file as of 10/20/2023.    Allergies (verified) Ace inhibitors, Cephalexin, Levaquin  [levofloxacin ], Ciprofloxacin , Codeine, and Latex   History: Past Medical History:  Diagnosis Date    ADHD (attention deficit hyperactivity disorder) 07/24/2019   Allergy    Anemia 2024   Anxiety    Cataract    Chronic diastolic CHF (congestive heart failure) (HCC)    cardriologist--- dr lelon. barbaraann;   dx in setting sepsis 11/ 2020;   echo in epic 03-10-2019 ef 55-60%, G1DD, mild to moderate TR no stenosis and normal RASP;  CTA 01-31-2021 epic   Chronic kidney disease (CKD), stage III (moderate) (HCC)    nephrologist--- dr val   Depression    Diverticulosis of colon    Emphysema of lung (HCC)    no home oxygen   GERD (gastroesophageal reflux disease)    History of acute pyelonephritis    w/ hx sepsis   History of Helicobacter pylori infection 2012   History of recurrent UTIs    History of TIA (transient ischemic attack)    TIAs   Hyperlipidemia, mixed    Hypertension    followed by pcp and nephrologist----  (nuclear stress test in epic 06-28-2015 low risk no ischemia, nuclear ef 60%) no meds   Hypothyroidism    IBS (irritable bowel syndrome)    IC (interstitial cystitis)    remote hx   Inguinal hernia, bilateral    Irritable bowel syndrome with constipation and diarrhea    LBBB (left bundle branch block) 2015   cardiologist --- dr barbaraann;  known chronic LBBB for several yrs   OCD (obsessive compulsive disorder)    OSA (obstructive sleep apnea)    no CPAP intolerant   Osteopenia    Prolapsed hemorrhoids    Seasonal allergies    Sleep apnea    Vitamin D  deficiency    Past Surgical History:  Procedure Laterality Date   ABDOMINAL HYSTERECTOMY  2000   ANTERIOR AND POSTERIOR REPAIR WITH SACROSPINOUS FIXATION N/A 11/10/2021   Procedure: POSTERIOR REPAIR WITH SACROSPINOUS FIXATION;  Surgeon: Marilynne Rosaline SAILOR, MD;  Location: [facility] [place];  Service: Gynecology;  Laterality: N/A;  total time requested is 1.5 hrs   BLADDER SUSPENSION N/A 11/10/2021   Procedure: TRANSVAGINAL TAPE (TVT) PROCEDURE;  Surgeon: Marilynne Rosaline SAILOR, MD;  Location: [facility];  Service: Gynecology;  Laterality: N/A;   BREAST ENHANCEMENT SURGERY Bilateral    1970s   BREAST IMPLANT REMOVAL Bilateral 03/07/2019   BREAST SURGERY  2020   Removal breast implants   BUNIONECTOMY Bilateral 1997   CATARACT EXTRACTION W/ INTRAOCULAR LENS IMPLANT Right 2005   COLONOSCOPY  10/2020   COSMETIC SURGERY  1976   Breast implants/now removed   CYSTOSCOPY WITH FULGERATION  11/10/2021   Procedure: CYSTOSCOPY WITH FULGERATION;  Surgeon: Marilynne Rosaline SAILOR, MD;  Location: [facility];  Service: Gynecology;;   ESOPHAGOGASTRODUODENOSCOPY (EGD) WITH PROPOFOL  N/A 03/12/2019   Procedure: ESOPHAGOGASTRODUODENOSCOPY (EGD) WITH PROPOFOL ;  Surgeon: Rollin Dover, MD;  Location: [facility];  Service: Endoscopy;  Laterality: N/A;   EYE SURGERY     Cataract r eye   FRACTURE SURGERY  2019   Leg ankle foot left   HEMORRHOID SURGERY  1976   HERNIA REPAIR  2020   R and L inguinal   INGUINAL HERNIA REPAIR N/A 04/10/2021   Procedure: LAPAROSCOPIC LEFT INGUINAL HERNIA REPAIR AND RIGHT INGUINAL HERNII REPAIR WITH TAP BLOCK;  Surgeon: Sheldon Standing, MD;  Location: [facility] [place];  Service: General;  Laterality: N/A;   ORIF ANKLE FRACTURE Left 08/30/2017   Procedure: OPEN REDUCTION INTERNAL FIXATION (ORIF) ANKLE FRACTURE;  Surgeon: Fidel Rogue, MD;  Location: [facility];  Service: Orthopedics;  Laterality: Left;   TONSILLECTOMY     CHILD   TUBAL LIGATION  1976   Family History  Problem Relation Age of Onset   Colon polyps Mother    Muscular dystrophy Mother        late 24s   Depression Mother    Early death Mother    Obesity Mother    Alcohol abuse Father    Depression Father    Early death Father    Hypertension Sister    Alcohol abuse Sister    Miscarriages / Stillbirths Sister    Muscular dystrophy Maternal Grandmother        50   Heart block Maternal Grandmother    Heart disease Maternal Grandmother    Stroke Maternal Grandmother     Diabetes Maternal Grandfather    ADD / ADHD Son    Cancer Son    Colon cancer Neg Hx    Esophageal cancer Neg Hx    Rectal cancer Neg Hx    Stomach cancer Neg Hx    Social History   Socioeconomic History   Marital status: Divorced    Spouse name: Not on file   Number of children: Not on file   Years of education: Not on file   Highest education level: Associate degree: occupational, Scientist, product/process development, or vocational program  Occupational History   Not on file  Tobacco Use   Smoking status: Former    Current packs/day: 0.00    Average packs/day: 1 pack/day for 40.0 years (40.0 ttl pk-yrs)    Types: Cigarettes    Start date: 1964    Quit date: 2004    Years since quitting: 21.4    Passive exposure: Past   Smokeless tobacco: Never  Vaping Use   Vaping status: Never Used  Substance and Sexual Activity   Alcohol use: Yes    Comment: rare   Drug use: Never   Sexual activity: Not Currently    Birth control/protection: Post-menopausal  Other Topics Concern   Not on file  Social History Narrative   Not on file   Social Drivers of Health   Financial  Resource Strain: Low Risk  (10/20/2023)   Overall Financial Resource Strain (CARDIA)    Difficulty of Paying Living Expenses: Not hard at all  Food Insecurity: No Food Insecurity (10/20/2023)   Hunger Vital Sign    Worried About Running Out of Food in the Last Year: Never true    Ran Out of Food in the Last Year: Never true  Transportation Needs: No Transportation Needs (10/20/2023)   PRAPARE - Administrator, Civil Service (Medical): No    Lack of Transportation (Non-Medical): No  Physical Activity: Insufficiently Active (10/20/2023)   Exercise Vital Sign    Days of Exercise per Week: 3 days    Minutes of Exercise per Session: 10 min  Stress: No Stress Concern Present (10/20/2023)   [employer] of Occupational Health - Occupational Stress Questionnaire    Feeling of Stress: Not at all  Recent Concern: Stress - Stress  Concern Present (08/09/2023)   Received from [facility] of Occupational Health - Occupational Stress Questionnaire    Feeling of Stress : To some extent  Social Connections: Socially Isolated (10/20/2023)   Social Connection and Isolation Panel    Frequency of Communication with Friends and Family: More than three times a week    Frequency of Social Gatherings with Friends and Family: Once a week    Attends Religious Services: Never    Database administrator or Organizations: No    Attends Engineer, structural: Never    Marital Status: Divorced    Tobacco Counseling Counseling given: Not Answered   Clinical Intake:  Pre-visit preparation completed: Yes  Pain : 0-10 Pain Score: 5  Pain Type: Acute pain Pain Location: Vagina Pain Onset: Other (comment) Pain Frequency: Constant     BMI - recorded: 26.54 Nutritional Status: BMI 25 -29 Overweight Diabetes: No  How often do you need to have someone help you when you read instructions, pamphlets, or other written materials from your doctor or pharmacy?: 1 - Never What is the last grade level you completed in school?: associates degree in nursing  Interpreter Needed?: No      Activities of Daily Living    10/20/2023   11:14 AM  In your present state of health, do you have any difficulty performing the following activities:  Hearing? 0  Vision? 0  Difficulty concentrating or making decisions? 0  Walking or climbing stairs? 1  Dressing or bathing? 0  Doing errands, shopping? 0  Preparing Food and eating ? N  Using the Toilet? N  In the past six months, have you accidently leaked urine? N  Do you have problems with loss of bowel control? N  Managing your Medications? N  Managing your Finances? N  Housekeeping or managing your Housekeeping? N    Patient Care Team: de Peru, Quintin PARAS, MD as PCP - General (Family Medicine) O'Neal, Darryle Ned, MD as PCP - Cardiology  (Cardiology) Rayburn Pac, MD as Consulting Physician (Nephrology) Robinson Mayo, OD as Referring Physician (Optometry) Aneita Gwendlyn DASEN, MD (Inactive) as Consulting Physician (Gastroenterology) Delford Maude BROCKS, MD as Consulting Physician (Cardiology) Nathanael Davies, [facility] (Inactive) as Pharmacist (Pharmacist) Katharina Pellet (Gynecology)  Indicate any recent Medical Services you may have received from other than [facility] providers in the past year (date may be approximate).     Assessment:   This is a routine wellness examination for [place].  Hearing/Vision screen No results found.   Goals Addressed  This Visit's Progress     patient stated (pt-stated)        Would like to come off a lot of medications she is taking at the moment       Depression Screen    10/20/2023   11:26 AM 10/20/2023   11:22 AM 10/04/2023    1:36 PM 07/01/2023    1:56 PM 09/15/2022   12:58 PM 02/16/2022   10:38 PM 10/15/2021   12:04 PM  PHQ 2/9 Scores  PHQ - 2 Score 1 1 3 3 2  0 0  PHQ- 9 Score 7 7 12 15 5  1     Fall Risk    10/20/2023   11:24 AM 10/04/2023    1:36 PM 07/01/2023    1:55 PM 09/15/2022   12:58 PM 02/16/2022   10:38 PM  Fall Risk   Falls in the past year? 0 1 0 0 0  Number falls in past yr: 0 0 0    Injury with Fall? 0 0 0    Risk for fall due to : No Fall Risks History of fall(s) No Fall Risks  No Fall Risks  Follow up Falls evaluation completed Falls evaluation completed;Education provided;Falls prevention discussed Falls evaluation completed  Falls prevention discussed;Education provided;Falls evaluation completed      Data saved with a previous flowsheet row definition     MEDICARE RISK AT HOME: Medicare Risk at Home Any stairs in or around the home?: Yes If so, are there any without handrails?: No Home free of loose throw rugs in walkways, pet beds, electrical cords, etc?: Yes Adequate lighting in your home to reduce risk of falls?: Yes Life alert?:  No Use of a cane, walker or w/c?: No Grab bars in the bathroom?: No Shower chair or bench in shower?: Yes Elevated toilet seat or a handicapped toilet?: Yes   Cognitive Function:    10/20/2023   11:26 AM  MMSE - Mini Mental State Exam  Not completed: Unable to complete        10/20/2023   11:26 AM  6CIT Screen  What Year? 0 points  What month? 0 points  What time? 0 points  Count back from 20 0 points  Months in reverse 0 points  Repeat phrase 0 points  Total Score 0 points    Immunizations Immunization History  Administered Date(s) Administered   Influenza Split 01/31/2016, 05/12/2018, 03/17/2019   Influenza, High Dose Seasonal PF 03/22/2017, 02/16/2018, 03/22/2019, 03/24/2021, 02/17/2022, 03/18/2023   Influenza-Unspecified 01/31/2016, 05/12/2018, 03/17/2019, 02/09/2020   PFIZER Comirnaty(Furness Top)Covid-19 Tri-Sucrose Vaccine 03/31/2021   PFIZER(Purple Top)SARS-COV-2 Vaccination 07/05/2019, 07/26/2019, 03/04/2020   Pneumococcal Conjugate-13 03/22/2017, 06/29/2018   Pneumococcal Polysaccharide-23 02/12/1999, 06/29/2018   Td 04/27/2013   Td (Adult),5 Lf Tetanus Toxid, Preservative Free 04/27/2013   Zoster Recombinant(Shingrix) 06/14/2020, 08/28/2020   Zoster, Live 11/24/2005    TDAP status: Due, Education has been provided regarding the importance of this vaccine. Advised may receive this vaccine at local pharmacy or Health Dept. Aware to provide a copy of the vaccination record if obtained from local pharmacy or Health Dept. Verbalized acceptance and understanding.  Flu Vaccine status: Up to date  Pneumococcal vaccine status: Up to date  Covid-19 vaccine status: Completed vaccines  Qualifies for Shingles Vaccine? Yes   Zostavax completed Yes   Shingrix Completed?: Yes  Screening Tests Health Maintenance  Topic Date Due   COVID-19 Vaccine (5 - 2024-25 season) 12/27/2022   DTaP/Tdap/Td (3 - Tdap) 04/28/2023   MAMMOGRAM  11/19/2023  DEXA SCAN  11/19/2023    INFLUENZA VACCINE  11/26/2023   Medicare Annual Wellness (AWV)  10/19/2024   Colonoscopy  11/14/2025   Pneumococcal Vaccine: 50+ Years  Completed   Hepatitis C Screening  Completed   Zoster Vaccines- Shingrix  Completed   Hepatitis B Vaccines  Aged Out   HPV VACCINES  Aged Out   Meningococcal B Vaccine  Aged Out    Health Maintenance  Health Maintenance Due  Topic Date Due   COVID-19 Vaccine (5 - 2024-25 season) 12/27/2022   DTaP/Tdap/Td (3 - Tdap) 04/28/2023    Colorectal cancer screening: Type of screening: Colonoscopy. Completed 11/14/20. Repeat every 5 years  Mammogram status: Ordered 10/20/23. Pt provided with contact info and advised to call to schedule appt.   Bone Density status: Ordered 10/20/23. Pt provided with contact info and advised to call to schedule appt.  Lung Cancer Screening: (Low Dose CT Chest recommended if Age 2-80 years, 20 pack-year currently smoking OR have quit w/in 15years.) does not qualify.     Additional Screening:  Hepatitis C Screening: does qualify; Completed 03/11/19  Vision Screening: Recommended annual ophthalmology exams for early detection of glaucoma and other disorders of the eye. Is the patient up to date with their annual eye exam?  No  Who is the provider or what is the name of the office in which the patient attends annual eye exams? Does not have one If pt is not established with a provider, would they like to be referred to a provider to establish care? Yes .   Dental Screening: Recommended annual dental exams for proper oral hygiene    Community Resource Referral / Chronic Care Management: CRR required this visit?  No   CCM required this visit?  No     Plan:     I have personally reviewed and noted the following in the patient's chart:   Medical and social history Use of alcohol, tobacco or illicit drugs  Current medications and supplements including opioid prescriptions. Patient is not currently taking opioid  prescriptions. Functional ability and status Nutritional status Physical activity Advanced directives List of other physicians Hospitalizations, surgeries, and ER visits in previous 12 months Vitals Screenings to include cognitive, depression, and falls Referrals and appointments  In addition, I have reviewed and discussed with patient certain preventive protocols, quality metrics, and best practice recommendations. A written personalized care plan for preventive services as well as general preventive health recommendations were provided to patient.     Kerri JONELLE Fuel, CMA   10/20/2023   After Visit Summary: (MyChart) Due to this being a telephonic visit, the after visit summary with patients personalized plan was offered to patient via MyChart   Nurse Notes:  Ms. Yokum , Thank you for taking time to come for your Medicare Wellness Visit. I appreciate your ongoing commitment to your health goals. Please review the following plan we discussed and let me know if I can assist you in the future.   These are the goals we discussed:  Goals       Blood Pressure < 130/80      Exercise 150 min/wk Moderate Activity      LDL CALC < 100      patient stated (pt-stated)      Would like to come off a lot of medications she is taking at the moment      Track and Manage Fluids and Swelling-Heart Failure      Timeframe:  Long-Range Goal Priority:  High Start Date:                             Expected End Date:                       Follow Up Date 04/29/2021    - track weight in diary - use salt in moderation - watch for swelling in feet, ankles and legs every day - weigh myself daily    Why is this important?   It is important to check your weight daily and watch how much salt and liquids you have.  It will help you to manage your heart failure.    Notes:         This is a list of the screening recommended for you and due dates:  Health Maintenance  Topic Date Due   COVID-19  Vaccine (5 - 2024-25 season) 12/27/2022   DTaP/Tdap/Td vaccine (3 - Tdap) 04/28/2023   Mammogram  11/19/2023   DEXA scan (bone density measurement)  11/19/2023   Flu Shot  11/26/2023   Medicare Annual Wellness Visit  10/19/2024   Colon Cancer Screening  11/14/2025   Pneumococcal Vaccine for age over 76  Completed   Hepatitis C Screening  Completed   Zoster (Shingles) Vaccine  Completed   Hepatitis B Vaccine  Aged Out   HPV Vaccine  Aged Out   Meningitis B Vaccine  Aged Out

## 2023-10-20 NOTE — Patient Instructions (Signed)
 [name] , Thank you for taking time to come for your Medicare Wellness Visit. I appreciate your ongoing commitment to your health goals. Please review the following plan we discussed and let me know if I can assist you in the future.   Screening recommendations/referrals: Colonoscopy: due 11/19/23 Mammogram: due now Bone Density: due now Recommended yearly ophthalmology/optometry visit for glaucoma screening and checkup Recommended yearly dental visit for hygiene and checkup  Vaccinations: Influenza vaccine: due fall of the year  Pneumococcal vaccine: completed Tdap vaccine: due Shingles vaccine: completed    Advanced directives: copy is in chart  Conditions/risks identified: falls  Next appointment: 1 year AWV   Preventive Care 65 Years and Older, Female Preventive care refers to lifestyle choices and visits with your health care provider that can promote health and wellness. What does preventive care include? A yearly physical exam. This is also called an annual well check. Dental exams once or twice a year. Routine eye exams. Ask your health care provider how often you should have your eyes checked. Personal lifestyle choices, including: Daily care of your teeth and gums. Regular physical activity. Eating a healthy diet. Avoiding tobacco and drug use. Limiting alcohol use. Practicing safe sex. Taking low-dose aspirin  every day. Taking vitamin and mineral supplements as recommended by your health care provider. What happens during an annual well check? The services and screenings done by your health care provider during your annual well check will depend on your age, overall health, lifestyle risk factors, and family history of disease. Counseling  Your health care provider may ask you questions about your: Alcohol use. Tobacco use. Drug use. Emotional well-being. Home and relationship well-being. Sexual activity. Eating habits. History of falls. Memory and ability  to understand (cognition). Work and work Astronomer. Reproductive health. Screening  You may have the following tests or measurements: Height, weight, and BMI. Blood pressure. Lipid and cholesterol levels. These may be checked every 5 years, or more frequently if you are over 12 years old. Skin check. Lung cancer screening. You may have this screening every year starting at age 48 if you have a 30-pack-year history of smoking and currently smoke or have quit within the past 15 years. Fecal occult blood test (FOBT) of the stool. You may have this test every year starting at age 76. Flexible sigmoidoscopy or colonoscopy. You may have a sigmoidoscopy every 5 years or a colonoscopy every 10 years starting at age 48. Hepatitis C blood test. Hepatitis B blood test. Sexually transmitted disease (STD) testing. Diabetes screening. This is done by checking your blood sugar (glucose) after you have not eaten for a while (fasting). You may have this done every 1-3 years. Bone density scan. This is done to screen for osteoporosis. You may have this done starting at age 83. Mammogram. This may be done every 1-2 years. Talk to your health care provider about how often you should have regular mammograms. Talk with your health care provider about your test results, treatment options, and if necessary, the need for more tests. Vaccines  Your health care provider may recommend certain vaccines, such as: Influenza vaccine. This is recommended every year. Tetanus, diphtheria, and acellular pertussis (Tdap, Td) vaccine. You may need a Td booster every 10 years. Zoster vaccine. You may need this after age 56. Pneumococcal 13-valent conjugate (PCV13) vaccine. One dose is recommended after age 40. Pneumococcal polysaccharide (PPSV23) vaccine. One dose is recommended after age 8. Talk to your health care provider about which screenings and  vaccines you need and how often you need them. This information is not  intended to replace advice given to you by your health care provider. Make sure you discuss any questions you have with your health care provider. Document Released: 05/10/2015 Document Revised: 01/01/2016 Document Reviewed: 02/12/2015 Elsevier Interactive Patient Education  2017 [employer].  Fall Prevention in the Home Falls can cause injuries. They can happen to people of all ages. There are many things you can do to make your home safe and to help prevent falls. What can I do on the outside of my home? Regularly fix the edges of walkways and driveways and fix any cracks. Remove anything that might make you trip as you walk through a door, such as a raised step or threshold. Trim any bushes or trees on the path to your home. Use bright outdoor lighting. Clear any walking paths of anything that might make someone trip, such as rocks or tools. Regularly check to see if handrails are loose or broken. Make sure that both sides of any steps have handrails. Any raised decks and porches should have guardrails on the edges. Have any leaves, snow, or ice cleared regularly. Use sand or salt on walking paths during winter. Clean up any spills in your garage right away. This includes oil or grease spills. What can I do in the bathroom? Use night lights. Install grab bars by the toilet and in the tub and shower. Do not use towel bars as grab bars. Use non-skid mats or decals in the tub or shower. If you need to sit down in the shower, use a plastic, non-slip stool. Keep the floor dry. Clean up any water  that spills on the floor as soon as it happens. Remove soap buildup in the tub or shower regularly. Attach bath mats securely with double-sided non-slip rug tape. Do not have throw rugs and other things on the floor that can make you trip. What can I do in the bedroom? Use night lights. Make sure that you have a light by your bed that is easy to reach. Do not use any sheets or blankets that are  too big for your bed. They should not hang down onto the floor. Have a firm chair that has side arms. You can use this for support while you get dressed. Do not have throw rugs and other things on the floor that can make you trip. What can I do in the kitchen? Clean up any spills right away. Avoid walking on wet floors. Keep items that you use a lot in easy-to-reach places. If you need to reach something above you, use a strong step stool that has a grab bar. Keep electrical cords out of the way. Do not use floor polish or wax that makes floors slippery. If you must use wax, use non-skid floor wax. Do not have throw rugs and other things on the floor that can make you trip. What can I do with my stairs? Do not leave any items on the stairs. Make sure that there are handrails on both sides of the stairs and use them. Fix handrails that are broken or loose. Make sure that handrails are as long as the stairways. Check any carpeting to make sure that it is firmly attached to the stairs. Fix any carpet that is loose or worn. Avoid having throw rugs at the top or bottom of the stairs. If you do have throw rugs, attach them to the floor with carpet tape. Make  sure that you have a light switch at the top of the stairs and the bottom of the stairs. If you do not have them, ask someone to add them for you. What else can I do to help prevent falls? Wear shoes that: Do not have high heels. Have rubber bottoms. Are comfortable and fit you well. Are closed at the toe. Do not wear sandals. If you use a stepladder: Make sure that it is fully opened. Do not climb a closed stepladder. Make sure that both sides of the stepladder are locked into place. Ask someone to hold it for you, if possible. Clearly mark and make sure that you can see: Any grab bars or handrails. First and last steps. Where the edge of each step is. Use tools that help you move around (mobility aids) if they are needed. These  include: Canes. Walkers. Scooters. Crutches. Turn on the lights when you go into a dark area. Replace any light bulbs as soon as they burn out. Set up your furniture so you have a clear path. Avoid moving your furniture around. If any of your floors are uneven, fix them. If there are any pets around you, be aware of where they are. Review your medicines with your doctor. Some medicines can make you feel dizzy. This can increase your chance of falling. Ask your doctor what other things that you can do to help prevent falls. This information is not intended to replace advice given to you by your health care provider. Make sure you discuss any questions you have with your health care provider. Document Released: 02/07/2009 Document Revised: 09/19/2015 Document Reviewed: 05/18/2014 Elsevier Interactive Patient Education  2017 [employer].

## 2023-10-26 ENCOUNTER — Other Ambulatory Visit (HOSPITAL_COMMUNITY): Payer: Self-pay

## 2023-10-30 ENCOUNTER — Encounter: Payer: Self-pay | Admitting: Obstetrics and Gynecology

## 2023-11-01 ENCOUNTER — Other Ambulatory Visit: Payer: Self-pay

## 2023-11-02 ENCOUNTER — Other Ambulatory Visit: Payer: Self-pay

## 2023-11-03 ENCOUNTER — Ambulatory Visit: Admitting: Obstetrics and Gynecology

## 2023-11-03 ENCOUNTER — Encounter: Payer: Self-pay | Admitting: Obstetrics and Gynecology

## 2023-11-03 VITALS — BP 112/73 | HR 85

## 2023-11-03 DIAGNOSIS — N3281 Overactive bladder: Secondary | ICD-10-CM

## 2023-11-03 DIAGNOSIS — Z87440 Personal history of urinary (tract) infections: Secondary | ICD-10-CM | POA: Diagnosis not present

## 2023-11-03 DIAGNOSIS — N390 Urinary tract infection, site not specified: Secondary | ICD-10-CM

## 2023-11-03 DIAGNOSIS — N3010 Interstitial cystitis (chronic) without hematuria: Secondary | ICD-10-CM | POA: Diagnosis not present

## 2023-11-03 MED ORDER — TROSPIUM CHLORIDE ER 60 MG PO CP24: 1.0000 | ORAL_CAPSULE | Freq: Every day | ORAL | 11 refills | Status: DC

## 2023-11-03 MED ORDER — METHENAMINE HIPPURATE 1 G PO TABS: 1.0000 g | ORAL_TABLET | Freq: Two times a day (BID) | ORAL | 11 refills | Status: DC

## 2023-11-03 NOTE — Progress Notes (Signed)
 [facility] Return Visit  SUBJECTIVE  History of Present Illness: SUSSIE MINOR is a 73 y.o. female seen in follow-up for bladder pain and incontinence.   Surgery: s/p Posterior repair, sacrospinous ligament fixation, midurethral sling, cystoscopy on 11/10/21 - Underwent intravesical botox  in the office on 08/13/22- no significant improvement.   Had a robotic rectopexy with Dr Lang at [facility] on 08/09/23. At the same time had cystoscopy with hydrodistention with Dr Darol. She also had gentamicin  instillations x3. Was also treated for a few urinary tract infections with fosfomycin.   She does not feel the hydrodistention helped with her bladder pain. For the last few days her pain has been less than 3/10. Continues to have bladder urgency. Having incontinence at night and increased nighttime frequency. Having some burning with urination off and on but does not feel like UTI. She is using vaginal estrogen cream.   Stopped the amitriptyline  because she did not like the way she felt.  Also stopped her antidepressant. She started trazodone . She is still taking the hyosciamine and atarax .  She tried the vaginal valium  but could not get it to dissolve much.   Has also tried DMSO instillations but this did not help.   Past Medical History: Patient  has a past medical history of ADHD (attention deficit hyperactivity disorder) (07/24/2019), Allergy, Anemia (2024), Anxiety, Cataract, Chronic diastolic CHF (congestive heart failure) (HCC), Chronic kidney disease (CKD), stage III (moderate) (HCC), Depression, Diverticulosis of colon, Emphysema of lung (HCC), GERD (gastroesophageal reflux disease), History of acute pyelonephritis, History of Helicobacter pylori infection (2012), History of recurrent UTIs, History of TIA (transient ischemic attack), Hyperlipidemia, mixed, Hypertension, Hypothyroidism, IBS (irritable bowel syndrome), IC (interstitial cystitis), Inguinal hernia, bilateral,  Irritable bowel syndrome with constipation and diarrhea, LBBB (left bundle branch block) (2015), OCD (obsessive compulsive disorder), OSA (obstructive sleep apnea), Osteopenia, Prolapsed hemorrhoids, Seasonal allergies, Sleep apnea, and Vitamin D  deficiency.   Past Surgical History: She  has a past surgical history that includes Tonsillectomy; Bunionectomy (Bilateral, 1997); Hemorrhoid surgery (1976); Breast enhancement surgery (Bilateral); ORIF ankle fracture (Left, 08/30/2017); Breast implant removal (Bilateral, 03/07/2019); Esophagogastroduodenoscopy (egd) with propofol  (N/A, 03/12/2019); Colonoscopy (10/2020); Cataract extraction w/ intraocular lens implant (Right, 2005); Inguinal hernia repair (N/A, 04/10/2021); Anterior and posterior repair with sacrospinous fixation (N/A, 11/10/2021); Bladder suspension (N/A, 11/10/2021); Cystoscopy with fulgeration (11/10/2021); Hernia repair (2020); Tubal ligation (1976); Abdominal hysterectomy (2000); Fracture surgery (2019); Eye surgery; Cosmetic surgery (1976); and Breast surgery (2020).   Medications: She has a current medication list which includes the following prescription(s): acetaminophen , aloe, arginine, aspirin  ec, bupropion , vitamin d -3, co-enzyme q-10, cranberry-d mannose, dha-epa-vitamin e, diazepam , dihydroxyaluminum sod carb, estradiol , gabapentin , guaifenesin , hydroxyzine , hyoscyamine , ipratropium-albuterol , levothyroxine , lidocaine -prilocaine , loratadine, magnesium , methenamine , ondansetron , OVER THE COUNTER MEDICATION, OVER THE COUNTER MEDICATION, pantoprazole , phenazopyridine , probiotic product, rosuvastatin , trazodone , and trospium  chloride.   Allergies: Patient is allergic to ace inhibitors, cephalexin, levaquin  [levofloxacin ], bactrim  [sulfamethoxazole -trimethoprim ], ciprofloxacin , codeine, and latex.   Social History: Patient  reports that she quit smoking about 21 years ago. Her smoking use included cigarettes. She started smoking about  61 years ago. She has a 40 pack-year smoking history. She has been exposed to tobacco smoke. She has never used smokeless tobacco. She reports current alcohol use. She reports that she does not use drugs.      OBJECTIVE     Physical Exam: Vitals:   11/03/23 1527  BP: 112/73  Pulse: 85     Gen: No apparent distress, A&O x 3.  Detailed Urogynecologic Evaluation:  deferred   ASSESSMENT AND PLAN    Ms. Ferrick is a 73 y.o. with:  1. Overactive bladder   2. Recurrent urinary tract infection   3. Interstitial cystitis     - Continue PO meds- hyoscyamine , atarax .  - can do Azo prn - Continue estrace  cream twice a week -  For bladder urgency and incontinence, start trospium  60mg  ER daily. Has previously failed myrbetriq  and gemtesa .  - Will also start methenamine  BID for UTI prevention since she has been on several rounds of antibiotics since she was last seen.   Follow up 2 months  Rosaline LOISE Caper, MD

## 2023-11-07 ENCOUNTER — Encounter: Payer: Self-pay | Admitting: Obstetrics and Gynecology

## 2023-11-08 ENCOUNTER — Encounter (HOSPITAL_COMMUNITY): Payer: Self-pay

## 2023-11-08 ENCOUNTER — Ambulatory Visit (HOSPITAL_COMMUNITY)
Admission: EM | Admit: 2023-11-08 | Discharge: 2023-11-08 | Disposition: A | Discharge status: Home / Self Care | Attending: Internal Medicine | Admitting: Internal Medicine

## 2023-11-08 ENCOUNTER — Encounter: Payer: Self-pay | Admitting: Obstetrics and Gynecology

## 2023-11-08 DIAGNOSIS — N390 Urinary tract infection, site not specified: Secondary | ICD-10-CM | POA: Diagnosis not present

## 2023-11-08 DIAGNOSIS — N3001 Acute cystitis with hematuria: Secondary | ICD-10-CM | POA: Insufficient documentation

## 2023-11-08 LAB — POCT URINALYSIS DIP (MANUAL ENTRY)
Bilirubin, UA: NEGATIVE
Glucose, UA: NEGATIVE mg/dL
Ketones, POC UA: NEGATIVE mg/dL
Nitrite, UA: NEGATIVE
Spec Grav, UA: 1.015 (ref 1.010–1.025)
Urobilinogen, UA: 0.2 U/dL
pH, UA: 6 (ref 5.0–8.0)

## 2023-11-08 MED ORDER — NITROFURANTOIN MONOHYD MACRO 100 MG PO CAPS: 100.0000 mg | ORAL_CAPSULE | Freq: Two times a day (BID) | ORAL | 0 refills | Status: DC

## 2023-11-08 NOTE — ED Triage Notes (Signed)
 Patient here today with c/o vaginal discomfort and pain in urination X 2 days after placing a catheter in herself.

## 2023-11-08 NOTE — ED Provider Notes (Signed)
 [facility]    CSN: [id number] Arrival date & time: 11/08/23  1656      History   Chief Complaint Chief Complaint  Patient presents with   Dysuria    HPI [name] is a 73 y.o. female.   [name] is a 73 y.o. female presenting for chief complaint of dysuria that started 2 days ago.  She self cathed 2 days ago due to bladder pressure and inability to void.  She additionally had 2 manually insert one of her fingers into her vagina to put pressure on her bladder and attempt to urinate prior to self cathing.  This is caused some irritation to the vaginal region and she believes that she now has a urinary tract infection due to contaminated self cath.  She does not usually have to self cath, however has this in case of emergency if she is not able to void.  She was recently seen by her urologist to put her on 2 different medications for UTI prevention due to chronic recurrent UTIs and overactive bladder.  She took 2 pills of the methenamine  and 1 pill of the Trospium  2 days ago, she believes these medications are what caused her inability to void requiring self cath.  When she has self cath in the past, she has had acute UTI afterwards due to contamination.  She states she cleaned herself very well and was able to relieve her bladder herself at home.  She has been able to void since self cath, however she experiences dysuria with each void.  Denies gross hematuria, nausea, vomiting, abdominal pain, flank pain, dizziness, fever, chills, urinary frequency, urinary hesitancy, and urinary urgency. Denies recent antibiotic use.  She was last treated for urinary tract infection with fosfomycin on October 04, 2023.  Urine culture grew Klebsiella pneumoniae.    Dysuria   Past Medical History:  Diagnosis Date   ADHD (attention deficit hyperactivity disorder) 07/24/2019   Allergy    Anemia 2024   Anxiety    Cataract    Chronic diastolic CHF (congestive heart failure) (HCC)     cardriologist--- dr lelon. barbaraann;   dx in setting sepsis 11/ 2020;   echo in epic 03-10-2019 ef 55-60%, G1DD, mild to moderate TR no stenosis and normal RASP;  CTA 01-31-2021 epic   Chronic kidney disease (CKD), stage III (moderate) (HCC)    nephrologist--- dr val   Depression    Diverticulosis of colon    Emphysema of lung (HCC)    no home oxygen   GERD (gastroesophageal reflux disease)    History of acute pyelonephritis    w/ hx sepsis   History of Helicobacter pylori infection 2012   History of recurrent UTIs    History of TIA (transient ischemic attack)    TIAs   Hyperlipidemia, mixed    Hypertension    followed by pcp and nephrologist----  (nuclear stress test in epic 06-28-2015 low risk no ischemia, nuclear ef 60%) no meds   Hypothyroidism    IBS (irritable bowel syndrome)    IC (interstitial cystitis)    remote hx   Inguinal hernia, bilateral    Irritable bowel syndrome with constipation and diarrhea    LBBB (left bundle branch block) 2015   cardiologist --- dr barbaraann;  known chronic LBBB for several yrs   OCD (obsessive compulsive disorder)    OSA (obstructive sleep apnea)    no CPAP intolerant   Osteopenia    Prolapsed hemorrhoids  Seasonal allergies    Sleep apnea    Vitamin D  deficiency     Patient Active Problem List   Diagnosis Date Noted   Dysuria 10/04/2023   Iron deficiency anemia 07/27/2023   Diverticular disease 07/01/2023   Rectal prolapse 04/29/2023   Decreased anal sphincter tone 01/25/2023   Difficulty urinating 01/25/2023   Prolapsed internal hemorrhoids, grade 3 01/25/2023   Coronary atherosclerosis of native coronary artery (per CTA 01/31/2021)  06/14/2021   Inguinal hernia, left 05/05/2021   Interstitial cystitis 06/14/2020   Overweight (BMI 25.0-29.9) 06/13/2020   Former smoker (40 pack year, quit 2004) 08/30/2019   ADHD (attention deficit hyperactivity disorder) 07/24/2019   Depression, controlled 07/24/2019   Atherosclerosis of  aorta (HCC) by Chest CT scan in 2020  03/22/2019   Emphysema lung (HCC) - per CT 03/09/2019    Chronic diastolic CHF (congestive heart failure) (HCC) 03/11/2019   Transaminitis 03/11/2019   Post-operative state 03/09/2019   Mechanical complication of breast prosthesis, subsequent encounter 03/01/2019   Osteopenia 01/16/2019   History of colon polyps 06/28/2018   Colon polyps 06/28/2018   Closed bimalleolar fracture 08/25/2017   Fracture of ankle 08/23/2017   Thrombocytopenia (HCC) 06/19/2015   CKD Stage 3 (HCC) 01/10/2015   IBS  01/10/2015   Hyperlipidemia, mixed 06/12/2014   Medication management 06/12/2014   LBBB (left bundle branch block) 07/24/2013   Major depression, recurrent, full remission (HCC)    OCD (obsessive compulsive disorder)    Hypothyroidism 04/05/2013    Past Surgical History:  Procedure Laterality Date   ABDOMINAL HYSTERECTOMY  2000   ANTERIOR AND POSTERIOR REPAIR WITH SACROSPINOUS FIXATION N/A 11/10/2021   Procedure: POSTERIOR REPAIR WITH SACROSPINOUS FIXATION;  Surgeon: Marilynne Rosaline SAILOR, MD;  Location: [facility];  Service: Gynecology;  Laterality: N/A;  total time requested is 1.5 hrs   BLADDER SUSPENSION N/A 11/10/2021   Procedure: TRANSVAGINAL TAPE (TVT) PROCEDURE;  Surgeon: Marilynne Rosaline SAILOR, MD;  Location: [facility];  Service: Gynecology;  Laterality: N/A;   BREAST ENHANCEMENT SURGERY Bilateral    1970s   BREAST IMPLANT REMOVAL Bilateral 03/07/2019   BREAST SURGERY  2020   Removal breast implants   BUNIONECTOMY Bilateral 1997   CATARACT EXTRACTION W/ INTRAOCULAR LENS IMPLANT Right 2005   COLONOSCOPY  10/2020   COSMETIC SURGERY  1976   Breast implants/now removed   CYSTOSCOPY WITH FULGERATION  11/10/2021   Procedure: CYSTOSCOPY WITH FULGERATION;  Surgeon: Marilynne Rosaline SAILOR, MD;  Location: [facility];  Service: Gynecology;;   ESOPHAGOGASTRODUODENOSCOPY (EGD) WITH PROPOFOL  N/A 03/12/2019    Procedure: ESOPHAGOGASTRODUODENOSCOPY (EGD) WITH PROPOFOL ;  Surgeon: Rollin Dover, MD;  Location[facility];  Service: Endoscopy;  Laterality: N/A;   EYE SURGERY     Cataract r eye   FRACTURE SURGERY  2019   Leg ankle foot left   HEMORRHOID SURGERY  1976   HERNIA REPAIR  2020   R and L inguinal   INGUINAL HERNIA REPAIR N/A 04/10/2021   Procedure: LAPAROSCOPIC LEFT INGUINAL HERNIA REPAIR AND RIGHT INGUINAL HERNII REPAIR WITH TAP BLOCK;  Surgeon: Sheldon Standing, MD;  Location: [facility];  Service: General;  Laterality: N/A;   ORIF ANKLE FRACTURE Left 08/30/2017   Procedure: OPEN REDUCTION INTERNAL FIXATION (ORIF) ANKLE FRACTURE;  Surgeon: Fidel Rogue, MD;  Location: [facility];  Service: Orthopedics;  Laterality: Left;   TONSILLECTOMY     CHILD   TUBAL LIGATION  1976    OB History  Gravida  3   Para      Term      Preterm      AB      Living  3      SAB      IAB      Ectopic      Multiple      Live Births  3            Home Medications    Prior to Admission medications   Medication Sig Start Date End Date Taking? Authorizing Provider  nitrofurantoin , macrocrystal-monohydrate, (MACROBID ) 100 MG capsule Take 1 capsule (100 mg total) by mouth 2 (two) times daily. 11/08/23  Yes Enedelia Dorna HERO, FNP  acetaminophen  (TYLENOL ) 325 MG tablet Take 650 mg by mouth every 6 (six) hours as needed.    [provider]  ALOE PO Take by mouth.    [provider]  Arginine 1000 MG TABS Take by mouth.    [provider]  aspirin  EC 81 MG tablet Take 81 mg by mouth daily.     [provider]  buPROPion  (WELLBUTRIN  XL) 150 MG 24 hr tablet Take 3 tablets (450 mg total) by mouth daily for depression. 06/08/23   Webb, Padonda B, FNP  Cholecalciferol (VITAMIN D -3) 5000 UNITS TABS Take 5,000 Units by mouth daily.     [provider]  co-enzyme Q-10 30 MG capsule Take 30 mg by mouth 3 (three) times daily.     [provider]  CRANBERRY-D MANNOSE PO Take by mouth.    [provider]  DHA-EPA-Vitamin E (OMEGA-3 COMPLEX PO) Take by mouth.    [provider]  diazepam  (VALIUM ) 5 MG tablet Place 1 tablet vaginally nightly as needed for muscle spasm/ pelvic pain. 03/16/23   Marilynne Rosaline SAILOR, MD  Dihydroxyaluminum Sod Carb (ROLAIDS PO) Take by mouth daily as needed.    [provider]  estradiol  (ESTRACE ) 0.1 MG/GM vaginal cream PLACE 1/2 GRAM VAGINALLY EVERY NIGHT AT BEDTIME FOR 2 WEEKS THEN USE 2 TIMES PER WEEK THEREAFTER 12/15/22   Marilynne Rosaline SAILOR, MD  gabapentin  (NEURONTIN ) 600 MG tablet Take 1/2 to 1 tablet 2 to 3 x /Daily as needed for Pain 08/31/23   de [place], Quintin PARAS, MD  guaifenesin  (HUMIBID E) 400 MG TABS tablet Take 400 mg by mouth daily.    [provider]  hydrOXYzine  (ATARAX ) 25 MG tablet Take 25 mg by mouth 3 (three) times daily.    [provider]  hyoscyamine  (LEVSIN  SL) 0.125 MG SL tablet DISSOLVE 1 TABLET UNDER THE TONGUE EVERY 4 HOURS IF NEEDED FOR NAUSEA, BLOATING, CRAMPING, OR DIARRHEA 10/04/23   de [place], Quintin PARAS, MD  ipratropium-albuterol  (DUONEB) 0.5-2.5 (3) MG/3ML SOLN USE 1 VIAL IN NEBULIZER EVERY 4 HOURS AS NEEDED **MAX 6 DOSES A DAY** 11/24/19   Jeanine Knee, NP  levothyroxine  (SYNTHROID ) 150 MCG tablet Take 1 tablet (150 mcg total) by mouth daily before breakfast. 09/09/23   de [place], Quintin PARAS, MD  lidocaine -prilocaine  (EMLA ) cream Apply 1 Application topically as needed. 10/04/22   Zuleta, Kaitlin G, NP  loratadine (CLARITIN) 10 MG tablet Take 10 mg by mouth daily.    [provider]  Magnesium  250 MG CAPS Take by mouth.    [provider]  ondansetron  (ZOFRAN ) 8 MG tablet TAKE 1 TABLET BY MOUTH 3 TIMES A DAY AS NEEDED FOR NAUSEA 10/02/20   Jeanine Knee, NP  OVER THE COUNTER MEDICATION Take 500 mg by  mouth daily at 2 PM. D-Mannose    [provider]  OVER THE COUNTER MEDICATION Climetidine     [provider]  pantoprazole  (PROTONIX ) 20 MG tablet Take 1 tablet (20 mg total) by mouth daily. Patient taking differently: Take 20 mg by mouth daily. Taking 1/2 tab a day 05/31/23   Cranford, Tonya, NP  phenazopyridine  (PYRIDIUM ) 95 MG tablet Take 95 mg by mouth 3 (three) times daily as needed for pain.    [provider]  Probiotic Product (PROBIOTIC BLEND PO)     [provider]  rosuvastatin  (CRESTOR ) 5 MG tablet Take  1 tablet 3 x /week for Cholesterol                                                         /                    TAKE                                     BY                                  MOUTH Patient taking differently: Taking 1/2 tablet 3 x week 07/06/23   Webb, Padonda B, FNP  traZODone  (DESYREL ) 50 MG tablet Take 1 tablet (50 mg total) by mouth at bedtime. 07/29/23   de [place], Raymond J, MD  Trospium  Chloride 60 MG CP24 Take 1 capsule (60 mg total) by mouth daily. 11/03/23   Marilynne Rosaline SAILOR, MD    Family History Family History  Problem Relation Age of Onset   Colon polyps Mother    Muscular dystrophy Mother        late 42s   Depression Mother    Early death Mother    Obesity Mother    Alcohol abuse Father    Depression Father    Early death Father    Hypertension Sister    Alcohol abuse Sister    Miscarriages / Stillbirths Sister    Muscular dystrophy Maternal Grandmother        50   Heart block Maternal Grandmother    Heart disease Maternal Grandmother    Stroke Maternal Grandmother    Diabetes Maternal Grandfather    ADD / ADHD Son    Cancer Son    Colon cancer Neg Hx    Esophageal cancer Neg Hx    Rectal cancer Neg Hx    Stomach cancer Neg Hx     Social History Social History   Tobacco Use   Smoking status: Former    Current packs/day: 0.00    Average packs/day: 1 pack/day for 40.0 years (40.0 ttl pk-yrs)    Types: Cigarettes    Start date: 1964    Quit date: 2004    Years since quitting: 21.5    Passive  exposure: Past   Smokeless tobacco: Never  Vaping Use   Vaping status: Never Used  Substance Use Topics   Alcohol use: Not Currently    Comment: rare   Drug use: Never     Allergies   Ace inhibitors, Cephalexin, Levaquin  [levofloxacin ], Bactrim  [  sulfamethoxazole -trimethoprim ], Ciprofloxacin , Codeine, and Latex   Review of Systems Review of Systems  Genitourinary:  Positive for dysuria.  Per HPI   Physical Exam Triage Vital Signs ED Triage Vitals  Encounter Vitals Group     BP 11/08/23 1718 120/75     Girls Systolic BP Percentile --      Girls Diastolic BP Percentile --      Boys Systolic BP Percentile --      Boys Diastolic BP Percentile --      Pulse Rate 11/08/23 1718 87     Resp 11/08/23 1718 16     Temp 11/08/23 1718 98.1 F (36.7 C)     Temp Source 11/08/23 1718 Oral     SpO2 11/08/23 1718 92 %     Weight --      Height --      Head Circumference --      Peak Flow --      Pain Score 11/08/23 1719 7     Pain Loc --      Pain Education --      Exclude from Growth Chart --    No data found.  Updated Vital Signs BP 120/75 (BP Location: Right Arm)   Pulse 87   Temp 98.1 F (36.7 C) (Oral)   Resp 16   SpO2 92%   Visual Acuity Right Eye Distance:   Left Eye Distance:   Bilateral Distance:    Right Eye Near:   Left Eye Near:    Bilateral Near:     Physical Exam Vitals and nursing note reviewed.  Constitutional:      Appearance: She is not ill-appearing or toxic-appearing.  HENT:     Head: Normocephalic and atraumatic.     Right Ear: Hearing and external ear normal.     Left Ear: Hearing and external ear normal.     Nose: Nose normal.     Mouth/Throat:     Lips: Pink.     Mouth: Mucous membranes are moist.     Pharynx: No posterior oropharyngeal erythema.  Eyes:     General: Lids are normal. Vision grossly intact. Gaze aligned appropriately.     Extraocular Movements: Extraocular movements intact.     Conjunctiva/sclera: Conjunctivae normal.   Pulmonary:     Effort: Pulmonary effort is normal.  Abdominal:     General: Abdomen is flat. Bowel sounds are normal. There is no distension.     Palpations: Abdomen is soft. There is no mass.     Tenderness: There is no abdominal tenderness. There is no right CVA tenderness, left CVA tenderness, guarding or rebound.  Musculoskeletal:     Cervical back: Neck supple.  Skin:    General: Skin is warm and dry.     Capillary Refill: Capillary refill takes less than 2 seconds.     Findings: No rash.  Neurological:     General: No focal deficit present.     Mental Status: She is alert and oriented to person, place, and time. Mental status is at baseline.     Cranial Nerves: No dysarthria or facial asymmetry.  Psychiatric:        Mood and Affect: Mood normal.        Speech: Speech normal.        Behavior: Behavior normal.        Thought Content: Thought content normal.        Judgment: Judgment normal.      UC Treatments / Results  Labs (all labs ordered are listed, but only abnormal results are displayed) Labs Reviewed  POCT URINALYSIS DIP (MANUAL ENTRY) - Abnormal; Notable for the following components:      Result Value   Clarity, UA cloudy (*)    Blood, UA moderate (*)    Protein Ur, POC trace (*)    Leukocytes, UA Small (1+) (*)    All other components within normal limits  URINE CULTURE    EKG   Radiology No results found.  Procedures Procedures (including critical care time)  Medications Ordered in UC Medications - No data to display  Initial Impression / Assessment and Plan / UC Course  I have reviewed the triage vital signs and the nursing notes.  Pertinent labs & imaging results that were available during my care of the patient were reviewed by me and considered in my medical decision making (see chart for details).   1.  Acute cystitis with hematuria Presentation is consistent with acute cystitis with hematuria likely secondary to self-catheterization at  home.  Patient is allergic to multiple antibiotics including cephalexin (anaphylaxis), ciprofloxacin  (rash), Bactrim  (itching and rash), and Levaquin  (nausea and vomiting). She states she has been able to tolerate Macrobid  in the past.  She most recently took fosfomycin but states this did not work very well to help her urinary symptoms and clear of her UTI.  GFR is greater than 60 based on most recent blood work from April 2025. I am comfortable prescribing her Macrobid  100 mg twice daily for 5 days to treat acute cystitis since this will be used in a short term fashion.  Advised to avoid urinary irritants, increase water  intake, and follow-up with her urogynecologist.  I would like for her to discuss medication management with her urogynecologist.  No signs of pyelonephritis or complicated UTI.  Counseled patient on potential for adverse effects with medications prescribed/recommended today, strict ER and return-to-clinic precautions discussed, patient verbalized understanding.    Final Clinical Impressions(s) / UC Diagnoses   Final diagnoses:  Acute cystitis with hematuria  Recurrent UTI     Discharge Instructions      Your urine shows you likely have a urinary tract infection.  I have sent your urine for culture to confirm this.  We will call you if we need to change your antibiotic when we find out the type of bacteria growing in your bladder.  Take antibiotic as directed with a snack/food to avoid stomach upset. To avoid GI upset please take this medication with food.   Avoid drinking beverages that irritate the urinary tract like sodas, tea, coffee, or juice. Drink plenty of water  to stay well hydrated and prevent severe infection.  If you develop any new or worsening symptoms or if your symptoms do not start to improve, pleases return here or follow-up with your primary care provider. If your symptoms are severe, please go to the emergency room.    ED Prescriptions      Medication Sig Dispense Auth. Provider   nitrofurantoin , macrocrystal-monohydrate, (MACROBID ) 100 MG capsule Take 1 capsule (100 mg total) by mouth 2 (two) times daily. 10 capsule Enedelia Dorna HERO, FNP      PDMP not reviewed this encounter.   Enedelia Dorna HERO, [place] 11/08/23 1858

## 2023-11-08 NOTE — Discharge Instructions (Signed)

## 2023-11-09 ENCOUNTER — Ambulatory Visit

## 2023-11-10 ENCOUNTER — Other Ambulatory Visit (HOSPITAL_BASED_OUTPATIENT_CLINIC_OR_DEPARTMENT_OTHER): Payer: Self-pay

## 2023-11-10 ENCOUNTER — Other Ambulatory Visit: Payer: Self-pay

## 2023-11-10 ENCOUNTER — Emergency Department (HOSPITAL_BASED_OUTPATIENT_CLINIC_OR_DEPARTMENT_OTHER)
Admission: EM | Admit: 2023-11-10 | Discharge: 2023-11-10 | Disposition: A | Discharge status: Home / Self Care | Attending: Emergency Medicine | Admitting: Emergency Medicine

## 2023-11-10 ENCOUNTER — Encounter (HOSPITAL_BASED_OUTPATIENT_CLINIC_OR_DEPARTMENT_OTHER): Payer: Self-pay | Admitting: Emergency Medicine

## 2023-11-10 ENCOUNTER — Ambulatory Visit (HOSPITAL_COMMUNITY): Payer: Self-pay

## 2023-11-10 DIAGNOSIS — E039 Hypothyroidism, unspecified: Secondary | ICD-10-CM | POA: Insufficient documentation

## 2023-11-10 DIAGNOSIS — R300 Dysuria: Secondary | ICD-10-CM | POA: Diagnosis not present

## 2023-11-10 DIAGNOSIS — Z91040 Latex allergy status: Secondary | ICD-10-CM | POA: Diagnosis not present

## 2023-11-10 DIAGNOSIS — Z7982 Long term (current) use of aspirin: Secondary | ICD-10-CM | POA: Insufficient documentation

## 2023-11-10 DIAGNOSIS — I130 Hypertensive heart and chronic kidney disease with heart failure and stage 1 through stage 4 chronic kidney disease, or unspecified chronic kidney disease: Secondary | ICD-10-CM | POA: Diagnosis not present

## 2023-11-10 DIAGNOSIS — B9620 Unspecified Escherichia coli [E. coli] as the cause of diseases classified elsewhere: Secondary | ICD-10-CM | POA: Diagnosis not present

## 2023-11-10 DIAGNOSIS — I509 Heart failure, unspecified: Secondary | ICD-10-CM | POA: Insufficient documentation

## 2023-11-10 DIAGNOSIS — N183 Chronic kidney disease, stage 3 unspecified: Secondary | ICD-10-CM | POA: Diagnosis not present

## 2023-11-10 DIAGNOSIS — R339 Retention of urine, unspecified: Secondary | ICD-10-CM | POA: Diagnosis present

## 2023-11-10 LAB — URINALYSIS, ROUTINE W REFLEX MICROSCOPIC
Bacteria, UA: NONE SEEN
Bilirubin Urine: NEGATIVE
Glucose, UA: NEGATIVE mg/dL
Ketones, ur: 15 mg/dL — AB
Nitrite: NEGATIVE
Specific Gravity, Urine: 1.012 (ref 1.005–1.030)
WBC, UA: >50 WBC/hpf (ref 0–5)
pH: 6.5 (ref 5.0–8.0)

## 2023-11-10 LAB — I-STAT VENOUS BLOOD GAS, ED
Acid-Base Excess: 0 mmol/L (ref 0.0–2.0)
Bicarbonate: 26.2 mmol/L (ref 20.0–28.0)
Calcium, Ion: 1.22 mmol/L (ref 1.15–1.40)
HCT: 31 % — ABNORMAL LOW (ref 36.0–46.0)
Hemoglobin: 10.5 g/dL — ABNORMAL LOW (ref 12.0–15.0)
O2 Saturation: 30 %
Patient temperature: 98.9
Potassium: 3.2 mmol/L — ABNORMAL LOW (ref 3.5–5.1)
Sodium: 140 mmol/L (ref 135–145)
TCO2: 28 mmol/L (ref 22–32)
pCO2, Ven: 47 mmHg (ref 44–60)
pH, Ven: 7.355 (ref 7.25–7.43)
pO2, Ven: 20 mmHg — CL (ref 32–45)

## 2023-11-10 LAB — BASIC METABOLIC PANEL WITH GFR
Anion gap: 11 (ref 5–15)
BUN: 22 mg/dL (ref 8–23)
CO2: 26 mmol/L (ref 22–32)
Calcium: 9.6 mg/dL (ref 8.9–10.3)
Chloride: 97 mmol/L — ABNORMAL LOW (ref 98–111)
Creatinine, Ser: 1.17 mg/dL — ABNORMAL HIGH (ref 0.44–1.00)
GFR, Estimated: 49 mL/min — ABNORMAL LOW (ref >60)
Glucose, Bld: 106 mg/dL — ABNORMAL HIGH (ref 70–99)
Potassium: 3.7 mmol/L (ref 3.5–5.1)
Sodium: 135 mmol/L (ref 135–145)

## 2023-11-10 LAB — CBC
HCT: 38.8 % (ref 36.0–46.0)
Hemoglobin: 13 g/dL (ref 12.0–15.0)
MCH: 30 pg (ref 26.0–34.0)
MCHC: 33.5 g/dL (ref 30.0–36.0)
MCV: 89.4 fL (ref 80.0–100.0)
Platelets: 249 K/uL (ref 150–400)
RBC: 4.34 MIL/uL (ref 3.87–5.11)
RDW: 13.5 % (ref 11.5–15.5)
WBC: 10.4 K/uL (ref 4.0–10.5)
nRBC: 0 % (ref 0.0–0.2)

## 2023-11-10 LAB — URINE CULTURE: Culture: 30000 — AB

## 2023-11-10 MED ORDER — FENTANYL CITRATE PF 50 MCG/ML IJ SOSY
50.0000 ug | PREFILLED_SYRINGE | Freq: Once | INTRAMUSCULAR | Status: AC
  Administered 2023-11-10: 50 ug via INTRAVENOUS
  Filled 2023-11-10: qty 1

## 2023-11-10 MED ORDER — METHOCARBAMOL 500 MG PO TABS
500.0000 mg | ORAL_TABLET | Freq: Three times a day (TID) | ORAL | 0 refills | Status: DC | PRN
  Filled 2023-11-10: qty 8, 3d supply, fill #0

## 2023-11-10 NOTE — Discharge Instructions (Signed)
 Continue the antibiotic.  You can try the muscle relaxer and see if it changes any symptoms.

## 2023-11-10 NOTE — ED Notes (Addendum)
 Non-latex foley catheter inserted per MD order. Pt reports increased pain and burning sensation in bladder area after insertion. Requested for foley catheter to be removed after insertion. of urine was drained from bag.

## 2023-11-10 NOTE — ED Notes (Signed)
Reviewed discharge instructions, medications, and home care with pt. Pt verbalized understanding and had no further questions. Pt exited ED without complications.

## 2023-11-10 NOTE — ED Notes (Signed)
 ED Provider at bedside.

## 2023-11-10 NOTE — ED Provider Notes (Signed)
 [place] [facility] DEPARTMENT AT [facility] Provider Note   CSN: [id number] Arrival date & time: 11/10/23  1157     Patient presents with: Urinary Retention   [name] is a 73 y.o. female.   HPI Patient presents urinary symptoms.  Potential urinary retention.  States only 200 cc has come out since midnight.  States that she has self cathed.  Thinks she may have given herself UTI.  Recently seen at urgent care 2 days ago.  Culture came back as E. coli.  Started on Macrobid . Had recently started trospium  and methenamine .  States she could not urinate after that.  Sees Dr. Werner from urogynecology.  He has had chills at home.   Past Medical History:  Diagnosis Date   ADHD (attention deficit hyperactivity disorder) 07/24/2019   Allergy    Anemia 2024   Anxiety    Cataract    Chronic diastolic CHF (congestive heart failure) (HCC)    cardriologist--- dr lelon. barbaraann;   dx in setting sepsis 11/ 2020;   echo in epic 03-10-2019 ef 55-60%, G1DD, mild to moderate TR no stenosis and normal RASP;  CTA 01-31-2021 epic   Chronic kidney disease (CKD), stage III (moderate) (HCC)    nephrologist--- dr val   Depression    Diverticulosis of colon    Emphysema of lung (HCC)    no home oxygen   GERD (gastroesophageal reflux disease)    History of acute pyelonephritis    w/ hx sepsis   History of Helicobacter pylori infection 2012   History of recurrent UTIs    History of TIA (transient ischemic attack)    TIAs   Hyperlipidemia, mixed    Hypertension    followed by pcp and nephrologist----  (nuclear stress test in epic 06-28-2015 low risk no ischemia, nuclear ef 60%) no meds   Hypothyroidism    IBS (irritable bowel syndrome)    IC (interstitial cystitis)    remote hx   Inguinal hernia, bilateral    Irritable bowel syndrome with constipation and diarrhea    LBBB (left bundle branch block) 2015   cardiologist --- dr barbaraann;  known chronic LBBB for several yrs    OCD (obsessive compulsive disorder)    OSA (obstructive sleep apnea)    no CPAP intolerant   Osteopenia    Prolapsed hemorrhoids    Seasonal allergies    Sleep apnea    Vitamin D  deficiency     Prior to Admission medications   Medication Sig Start Date End Date Taking? Authorizing Provider  methocarbamol  (ROBAXIN ) 500 MG tablet Take 1 tablet (500 mg total) by mouth every 8 (eight) hours as needed. 11/10/23  Yes Patsey Lot, MD  acetaminophen  (TYLENOL ) 325 MG tablet Take 650 mg by mouth every 6 (six) hours as needed.    [provider]  ALOE PO Take by mouth.    [provider]  Arginine 1000 MG TABS Take by mouth.    [provider]  aspirin  EC 81 MG tablet Take 81 mg by mouth daily.     [provider]  buPROPion  (WELLBUTRIN  XL) 150 MG 24 hr tablet Take 3 tablets (450 mg total) by mouth daily for depression. 06/08/23   Webb, Padonda B, FNP  Cholecalciferol (VITAMIN D -3) 5000 UNITS TABS Take 5,000 Units by mouth daily.     [provider]  co-enzyme Q-10 30 MG capsule Take 30 mg by mouth 3 (three) times daily.    [provider]  CRANBERRY-D MANNOSE PO Take by mouth.    [provider]  DHA-EPA-Vitamin E (OMEGA-3 COMPLEX PO) Take by mouth.    [provider]  diazepam  (VALIUM ) 5 MG tablet Place 1 tablet vaginally nightly as needed for muscle spasm/ pelvic pain. 03/16/23   Marilynne Rosaline SAILOR, MD  Dihydroxyaluminum Sod Carb (ROLAIDS PO) Take by mouth daily as needed.    [provider]  estradiol  (ESTRACE ) 0.1 MG/GM vaginal cream PLACE 1/2 GRAM VAGINALLY EVERY NIGHT AT BEDTIME FOR 2 WEEKS THEN USE 2 TIMES PER WEEK THEREAFTER 12/15/22   Marilynne Rosaline SAILOR, MD  gabapentin  (NEURONTIN ) 600 MG tablet Take 1/2 to 1 tablet 2 to 3 x /Daily as needed for Pain 08/31/23   de [place], Quintin PARAS, MD  guaifenesin  (HUMIBID E) 400 MG TABS tablet Take 400 mg by mouth daily.    [provider]  hydrOXYzine  (ATARAX )  25 MG tablet Take 25 mg by mouth 3 (three) times daily.    [provider]  hyoscyamine  (LEVSIN  SL) 0.125 MG SL tablet DISSOLVE 1 TABLET UNDER THE TONGUE EVERY 4 HOURS IF NEEDED FOR NAUSEA, BLOATING, CRAMPING, OR DIARRHEA 10/04/23   de [place], Quintin PARAS, MD  ipratropium-albuterol  (DUONEB) 0.5-2.5 (3) MG/3ML SOLN USE 1 VIAL IN NEBULIZER EVERY 4 HOURS AS NEEDED **MAX 6 DOSES A DAY** 11/24/19   Jeanine Knee, NP  levothyroxine  (SYNTHROID ) 150 MCG tablet Take 1 tablet (150 mcg total) by mouth daily before breakfast. 09/09/23   de [place], Quintin PARAS, MD  lidocaine -prilocaine  (EMLA ) cream Apply 1 Application topically as needed. 10/04/22   Zuleta, Kaitlin G, NP  loratadine (CLARITIN) 10 MG tablet Take 10 mg by mouth daily.    [provider]  Magnesium  250 MG CAPS Take by mouth.    [provider]  nitrofurantoin , macrocrystal-monohydrate, (MACROBID ) 100 MG capsule Take 1 capsule (100 mg total) by mouth 2 (two) times daily. 11/08/23   Enedelia Dorna HERO, FNP  ondansetron  (ZOFRAN ) 8 MG tablet TAKE 1 TABLET BY MOUTH 3 TIMES A DAY AS NEEDED FOR NAUSEA 10/02/20   Jeanine Knee, NP  OVER THE COUNTER MEDICATION Take 500 mg by mouth daily at 2 PM. D-Mannose    [provider]  OVER THE COUNTER MEDICATION Climetidine    [provider]  pantoprazole  (PROTONIX ) 20 MG tablet Take 1 tablet (20 mg total) by mouth daily. Patient taking differently: Take 20 mg by mouth daily. Taking 1/2 tab a day 05/31/23   Cranford, Tonya, NP  phenazopyridine  (PYRIDIUM ) 95 MG tablet Take 95 mg by mouth 3 (three) times daily as needed for pain.    [provider]  Probiotic Product (PROBIOTIC BLEND PO)     [provider]  rosuvastatin  (CRESTOR ) 5 MG tablet Take  1 tablet 3 x /week for Cholesterol                                                         /                    TAKE                                     BY  MOUTH Patient taking differently:  Taking 1/2 tablet 3 x week 07/06/23   Webb, Padonda B, FNP  traZODone  (DESYREL ) 50 MG tablet Take 1 tablet (50 mg total) by mouth at bedtime. 07/29/23   de [place], Raymond J, MD    Allergies: Ace inhibitors, Cephalexin, Levaquin  [levofloxacin ], Bactrim  [sulfamethoxazole -trimethoprim ], Ciprofloxacin , Codeine, and Latex    Review of Systems  Updated Vital Signs BP 108/75   Pulse 88   Temp 99.1 F (37.3 C) (Oral)   Resp 20   Ht 5' 3 (1.6 m)   Wt 67.9 kg   SpO2 93%   BMI 26.52 kg/m   Physical Exam Vitals and nursing note reviewed.  HENT:     Head: Normocephalic.  Cardiovascular:     Rate and Rhythm: Normal rate.  Abdominal:     Tenderness: There is abdominal tenderness.     Comments: Suprapubic tenderness without rebound or guarding.  No hernia palpated.  Neurological:     Mental Status: She is alert and oriented to person, place, and time.     (all labs ordered are listed, but only abnormal results are displayed) Labs Reviewed  URINALYSIS, ROUTINE W REFLEX MICROSCOPIC - Abnormal; Notable for the following components:      Result Value   Hgb urine dipstick TRACE (*)    Ketones, ur 15 (*)    Protein, ur TRACE (*)    Leukocytes,Ua LARGE (*)    All other components within normal limits  BASIC METABOLIC PANEL WITH GFR - Abnormal; Notable for the following components:   Chloride 97 (*)    Glucose, Bld 106 (*)    Creatinine, Ser 1.17 (*)    GFR, Estimated 49 (*)    All other components within normal limits  CBC    EKG: None  Radiology: No results found.   Procedures   Medications Ordered in the ED  fentaNYL  (SUBLIMAZE ) injection 50 mcg (50 mcg Intravenous Given 11/10/23 1401)                                    Medical Decision Making Amount and/or Complexity of Data Reviewed Labs: ordered.  Risk Prescription drug management.   Patient with dysuria.  History of interstitial cystitis and potential urinary retention.  Also had recent urine culture that was  positive.  Has had Klebsiella previously but now E. coli.  Differential diagnosis includes interstitial cystitis and also UTI.  Will get blood work and place catheter.  States she wants a catheter so she does not need to straight cath.  I have reviewed gynurology note.  Kidney function actually improved from prior.  Urine shows white cells but no bacteria.  For now continue antibiotics that she had been on but also could be interstitial cystitis.  Patient had Foley catheter in but states she could not tolerate it.  Had taken out.  Had given about 200 cc of urine.  Patient thinks muscle relaxers may help.  Will give small dose of Robaxin  to see.  Can follow-up with her urologist.     Final diagnoses:  Dysuria    ED Discharge Orders          Ordered    methocarbamol  (ROBAXIN ) 500 MG tablet  Every 8 hours PRN        11/10/23 1430               Patsey Lot, MD 11/10/23 1449

## 2023-11-10 NOTE — ED Triage Notes (Signed)
 Pt via pov from home with urinary retention intermittently since Friday. Pt has strugged for about a year with cystitis and retention. Pt states she has cathed herself and gave herself a UTI by doing it herself. Pt alert & tearful during triage.  NAD noted.

## 2023-11-11 ENCOUNTER — Encounter: Payer: Self-pay | Admitting: Obstetrics and Gynecology

## 2023-11-11 ENCOUNTER — Encounter (HOSPITAL_BASED_OUTPATIENT_CLINIC_OR_DEPARTMENT_OTHER): Payer: Self-pay | Admitting: Family Medicine

## 2023-11-12 ENCOUNTER — Telehealth: Payer: Self-pay

## 2023-11-12 DIAGNOSIS — M62838 Other muscle spasm: Secondary | ICD-10-CM

## 2023-11-12 MED ORDER — METHOCARBAMOL 500 MG PO TABS: 500.0000 mg | ORAL_TABLET | Freq: Three times a day (TID) | ORAL | 1 refills | Status: DC | PRN

## 2023-11-12 NOTE — Addendum Note (Signed)
 Addended by: MARILYNNE ROSALINE SAILOR on: 11/12/2023 02:04 PM   Modules accepted: Orders

## 2023-11-12 NOTE — Telephone Encounter (Signed)
 Pt of Dr. Everitt Cuba's. Thersia, please see mychart message sent by pt and advise.

## 2023-11-12 NOTE — Telephone Encounter (Signed)
 Patient's daughter called stating her mother was in the hospital and was giving Robaxin  due to her bladder pain but she only received a small amount with the prescription. She wants to know if youcan prescribe her more due to her being in so much pain. Please advise.

## 2023-11-12 NOTE — Telephone Encounter (Signed)
 Rx for Robaxin  provided.

## 2023-11-17 ENCOUNTER — Ambulatory Visit: Admitting: Obstetrics and Gynecology

## 2023-11-17 ENCOUNTER — Encounter: Payer: Self-pay | Admitting: Obstetrics and Gynecology

## 2023-11-17 VITALS — BP 114/76 | HR 89

## 2023-11-17 DIAGNOSIS — N3281 Overactive bladder: Secondary | ICD-10-CM

## 2023-11-17 DIAGNOSIS — N3010 Interstitial cystitis (chronic) without hematuria: Secondary | ICD-10-CM

## 2023-11-17 DIAGNOSIS — Z87440 Personal history of urinary (tract) infections: Secondary | ICD-10-CM

## 2023-11-17 DIAGNOSIS — R102 Pelvic and perineal pain: Secondary | ICD-10-CM | POA: Diagnosis not present

## 2023-11-17 DIAGNOSIS — N390 Urinary tract infection, site not specified: Secondary | ICD-10-CM

## 2023-11-17 DIAGNOSIS — M62838 Other muscle spasm: Secondary | ICD-10-CM

## 2023-11-17 NOTE — Progress Notes (Signed)
 [facility] Return Visit  SUBJECTIVE  History of Present Illness: [name] is a 73 y.o. female seen in follow-up for IC/ bladder pain, OAB with incontinence  Surgery: s/p Posterior repair, sacrospinous ligament fixation, midurethral sling, cystoscopy on 11/10/21 - intravesical botox  in the office on 08/13/22- no significant improvement.  - robotic rectopexy with Dr Lang at [place] on 08/09/23 along with cystoscopy with hydrodistention with Dr Darol. Tried gentamicin  instillations x3. No improvement with bladder symptoms.  - DMSO instillations- no improvement - Stopped the amitriptyline  because she did not like the way she felt.  Also stopped her antidepressant. She started trazodone . She is still taking the hyosciamine and atarax .  She tried the vaginal valium  but could not get it to dissolve much and took it orally.  - Has tried many natural supplements but has stopped: probiotics, aloe vera, marshmallow root, L-arginine, turmeric - For OAB- failed trospium , gemtesa , myrbetriq  - Last visit with pelvic PT was Dec 2024.   Last visit was started on trospium  for OAB symptoms and methenamine  for UTI prevention. She reports that after taking the trospium  then she was unable to urinate for about 10 hours. She catheterized herself and had a lot of urine come out. She had stopped the medication after that. But continued to have difficulty voiding. She went to urgent care and was treated for a UTI. She was catheterized for at that visit. Urine culture returned with 30,000 E.Coli and she was treated with macrobid .    Past Medical History: Patient  has a past medical history of ADHD (attention deficit hyperactivity disorder) (07/24/2019), Allergy, Anemia (2024), Anxiety, Cataract, Chronic diastolic CHF (congestive heart failure) (HCC), Chronic kidney disease (CKD), stage III (moderate) (HCC), Depression, Diverticulosis of colon, Emphysema of lung (HCC), GERD (gastroesophageal  reflux disease), History of acute pyelonephritis, History of Helicobacter pylori infection (2012), History of recurrent UTIs, History of TIA (transient ischemic attack), Hyperlipidemia, mixed, Hypertension, Hypothyroidism, IBS (irritable bowel syndrome), IC (interstitial cystitis), Inguinal hernia, bilateral, Irritable bowel syndrome with constipation and diarrhea, LBBB (left bundle branch block) (2015), OCD (obsessive compulsive disorder), OSA (obstructive sleep apnea), Osteopenia, Prolapsed hemorrhoids, Seasonal allergies, Sleep apnea, and Vitamin D  deficiency.   Past Surgical History: She  has a past surgical history that includes Tonsillectomy; Bunionectomy (Bilateral, 1997); Hemorrhoid surgery (1976); Breast enhancement surgery (Bilateral); ORIF ankle fracture (Left, 08/30/2017); Breast implant removal (Bilateral, 03/07/2019); Esophagogastroduodenoscopy (egd) with propofol  (N/A, 03/12/2019); Colonoscopy (10/2020); Cataract extraction w/ intraocular lens implant (Right, 2005); Inguinal hernia repair (N/A, 04/10/2021); Anterior and posterior repair with sacrospinous fixation (N/A, 11/10/2021); Bladder suspension (N/A, 11/10/2021); Cystoscopy with fulgeration (11/10/2021); Hernia repair (2020); Tubal ligation (1976); Abdominal hysterectomy (2000); Fracture surgery (2019); Eye surgery; Cosmetic surgery (1976); and Breast surgery (2020).   Medications: She has a current medication list which includes the following prescription(s): arginine, gabapentin , hydroxyzine , methocarbamol , trazodone , acetaminophen , aloe, aspirin  ec, bupropion , vitamin d -3, co-enzyme q-10, cranberry-d mannose, dha-epa-vitamin e, diazepam , dihydroxyaluminum sod carb, estradiol , guaifenesin , hyoscyamine , ipratropium-albuterol , levothyroxine , lidocaine -prilocaine , loratadine, magnesium , nitrofurantoin  (macrocrystal-monohydrate), ondansetron , OVER THE COUNTER MEDICATION, OVER THE COUNTER MEDICATION, phenazopyridine , probiotic product, and  rosuvastatin .   Allergies: Patient is allergic to ace inhibitors, cephalexin, levaquin  [levofloxacin ], bactrim  [sulfamethoxazole -trimethoprim ], ciprofloxacin , codeine, and latex.   Social History: Patient  reports that she quit smoking about 21 years ago. Her smoking use included cigarettes. She started smoking about 61 years ago. She has a 40 pack-year smoking history. She has been exposed to tobacco smoke. She has never used smokeless tobacco. She reports that she does  not currently use alcohol. She reports that she does not use drugs.      OBJECTIVE     Physical Exam: Vitals:   11/17/23 1524  BP: 114/76  Pulse: 89   Gen: No apparent distress, A&O x 3.  Detailed Urogynecologic Evaluation:  deferred   ASSESSMENT AND PLAN    [name] is a 73 y.o. with:  1. Overactive bladder   2. Interstitial cystitis   3. Pelvic pain   4. Levator spasm   5. Recurrent UTI     Bladder pain/ IC/ levator spasm - Continue PO meds- hyoscyamine , atarax .  - can do Azo prn - We discussed that pelvic floor muscle spasm is likely contributing based on her prior exams. She has not been to PT since last year and has not done any exercises on her own. She prefers to do some self pelvic floor massage with a pelvic wand. She may be open to seeing a pelvic PT again, but wants to try a different therapist. Referral placed to [facility].  - We also discussed management of pelvic floor muscle spasm with trigger point injections. Would recommend trying the pelvic wand / PT first as this is likely to work longer term.  - Will also refer to [facility] for discussion of alternative pain treatments  2. OAB -  Has failed several medications and intravesical botox  - Discussed options of tibial nerve stimulation vs a sacral nerve stimulator. She is interested in trying a less invasive approach first so will try PTNS. She was given a bladder diary to fill out for her baseline prior to her first session.   3.  UTI - We discussed that it is unclear if the low bacterial count on culture was a true UTI. She likely had an IC flare due to urinary retention with the trospium . However, has had other recent UTIs in the last few months.  - Continue methenamine  BID and estrace  cream twice weekly for UTI prevention.  - At her request, will refer to infectious disease to see if there are further recommendations for UTIs.   Will follow up for PTNS  Rosaline LOISE Caper, MD  Time spent: I spent 70 minutes dedicated to the care of this patient on the date of this encounter to include pre-visit review of records, face-to-face time with the patient and post visit documentation and ordering medication/ testing.

## 2023-11-17 NOTE — Patient Instructions (Addendum)
 Additional treatments to try for relief of IC symptoms:  Over the counter natural supplements: -Bladder Ease by [employer], Bladder Rest or Bladder Builder (all contain L-arginine) - helps to rebuild protective bladder layer and reduce bladder pain -Marshmallow Root (relieves inflammation in the urinary tract) - pills available or drink as a tea -Aloe Vera: [employer] Aloe Vera capsules,  AloePath (also contains L-arginine and calcium  cabonate)- can take several months to see improvement -Fish oil - 4 g daily -Tumeric (standardized to 95% curcuminoids) - 500 mg three times daily - Probiotics  -Mindfulness practice (yoga, meditation, deep breathing)

## 2023-11-22 ENCOUNTER — Encounter: Payer: Self-pay | Admitting: Obstetrics and Gynecology

## 2023-11-22 ENCOUNTER — Telehealth (HOSPITAL_BASED_OUTPATIENT_CLINIC_OR_DEPARTMENT_OTHER): Payer: Self-pay | Admitting: *Deleted

## 2023-11-22 ENCOUNTER — Encounter: Payer: Self-pay | Admitting: Cardiovascular Disease

## 2023-11-22 NOTE — Telephone Encounter (Signed)
 noted

## 2023-11-22 NOTE — Telephone Encounter (Signed)
 Copied from CRM [id number]. Topic: Appointments - Transfer of Care >> Nov 22, 2023  1:46 PM [place] E wrote: Pt is requesting to transfer FROM: Raymond De Peru, MD Pt is requesting to transfer TO: Corean Comment, NP Reason for requested transfer: Patient wanted a provider that more aligned with her needs. It is the responsibility of the team the patient would like to transfer to [facility], NP) to reach out to the patient if for any reason this transfer is not acceptable.

## 2023-11-23 ENCOUNTER — Encounter: Payer: Self-pay | Admitting: Obstetrics and Gynecology

## 2023-11-29 ENCOUNTER — Ambulatory Visit: Admitting: Infectious Diseases

## 2023-11-29 ENCOUNTER — Other Ambulatory Visit: Payer: Self-pay

## 2023-11-29 ENCOUNTER — Encounter: Payer: Self-pay | Admitting: Infectious Diseases

## 2023-11-29 VITALS — BP 135/65 | HR 57 | Temp 98.8°F | Wt 151.0 lb

## 2023-11-29 DIAGNOSIS — Z79899 Other long term (current) drug therapy: Secondary | ICD-10-CM

## 2023-11-29 DIAGNOSIS — R300 Dysuria: Secondary | ICD-10-CM | POA: Diagnosis not present

## 2023-11-29 DIAGNOSIS — N3281 Overactive bladder: Secondary | ICD-10-CM | POA: Diagnosis not present

## 2023-11-29 DIAGNOSIS — N390 Urinary tract infection, site not specified: Secondary | ICD-10-CM | POA: Diagnosis not present

## 2023-11-29 NOTE — Progress Notes (Incomplete)
 Patient Active Problem List   Diagnosis Date Noted   Dysuria 10/04/2023   Iron deficiency anemia 07/27/2023   Diverticular disease 07/01/2023   Rectal prolapse 04/29/2023   Decreased anal sphincter tone 01/25/2023   Difficulty urinating 01/25/2023   Prolapsed internal hemorrhoids, grade 3 01/25/2023   Coronary atherosclerosis of native coronary artery (per CTA 01/31/2021)  06/14/2021   Inguinal hernia, left 05/05/2021   Interstitial cystitis 06/14/2020   Overweight (BMI 25.0-29.9) 06/13/2020   Former smoker (40 pack year, quit 2004) 08/30/2019   ADHD (attention deficit hyperactivity disorder) 07/24/2019   Depression, controlled 07/24/2019   Atherosclerosis of aorta (HCC) by Chest CT scan in 2020  03/22/2019   Emphysema lung (HCC) - per CT 03/09/2019    Chronic diastolic CHF (congestive heart failure) (HCC) 03/11/2019   Transaminitis 03/11/2019   Post-operative state 03/09/2019   Mechanical complication of breast prosthesis, subsequent encounter 03/01/2019   Osteopenia 01/16/2019   History of colon polyps 06/28/2018   Colon polyps 06/28/2018   Closed bimalleolar fracture 08/25/2017   Fracture of ankle 08/23/2017   Thrombocytopenia (HCC) 06/19/2015   CKD Stage 3 (HCC) 01/10/2015   IBS  01/10/2015   Hyperlipidemia, mixed 06/12/2014   Medication management 06/12/2014   LBBB (left bundle branch block) 07/24/2013   Major depression, recurrent, full remission (HCC)    OCD (obsessive compulsive disorder)    Hypothyroidism 04/05/2013   Current Outpatient Medications on File Prior to Visit  Medication Sig Dispense Refill   acetaminophen  (TYLENOL ) 325 MG tablet Take 650 mg by mouth every 6 (six) hours as needed. (Patient not taking: Reported on 11/17/2023)     aspirin  EC 81 MG tablet Take 81 mg by mouth daily.  (Patient not taking: Reported on 11/17/2023)     DHA-EPA-Vitamin E (OMEGA-3 COMPLEX PO) Take by mouth. (Patient not taking: Reported on 11/17/2023)     diazepam  (VALIUM ) 5 MG  tablet Place 1 tablet vaginally nightly as needed for muscle spasm/ pelvic pain. (Patient not taking: Reported on 11/17/2023) 30 tablet 1   Dihydroxyaluminum Sod Carb (ROLAIDS PO) Take by mouth daily as needed. (Patient not taking: Reported on 11/17/2023)     estradiol  (ESTRACE ) 0.1 MG/GM vaginal cream PLACE 1/2 GRAM VAGINALLY EVERY NIGHT AT BEDTIME FOR 2 WEEKS THEN USE 2 TIMES PER WEEK THEREAFTER (Patient not taking: Reported on 11/17/2023) 42.5 g 11   gabapentin  (NEURONTIN ) 600 MG tablet Take 1/2 to 1 tablet 2 to 3 x /Daily as needed for Pain 270 tablet 1   guaifenesin  (HUMIBID E) 400 MG TABS tablet Take 400 mg by mouth daily. (Patient not taking: Reported on 11/17/2023)     hydrOXYzine  (ATARAX ) 25 MG tablet Take 25 mg by mouth 3 (three) times daily.     hyoscyamine  (LEVSIN  SL) 0.125 MG SL tablet DISSOLVE 1 TABLET UNDER THE TONGUE EVERY 4 HOURS IF NEEDED FOR NAUSEA, BLOATING, CRAMPING, OR DIARRHEA (Patient not taking: Reported on 11/17/2023) 120 tablet 1   ipratropium-albuterol  (DUONEB) 0.5-2.5 (3) MG/3ML SOLN USE 1 VIAL IN NEBULIZER EVERY 4 HOURS AS NEEDED **MAX 6 DOSES A DAY** (Patient not taking: Reported on 11/17/2023) 540 mL 0   levothyroxine  (SYNTHROID ) 150 MCG tablet Take 1 tablet (150 mcg total) by mouth daily before breakfast. (Patient not taking: Reported on 11/17/2023) 90 tablet 0   lidocaine -prilocaine  (EMLA ) cream Apply 1 Application topically as needed. (Patient not taking: Reported on 11/17/2023) 30 g 0   Magnesium  250 MG CAPS Take by mouth. (Patient not taking: Reported on 11/17/2023)  methocarbamol  (ROBAXIN ) 500 MG tablet Take 1 tablet (500 mg total) by mouth every 8 (eight) hours as needed for muscle spasms. 90 tablet 1   ondansetron  (ZOFRAN ) 8 MG tablet TAKE 1 TABLET BY MOUTH 3 TIMES A DAY AS NEEDED FOR NAUSEA (Patient not taking: Reported on 11/17/2023) 60 tablet 2   phenazopyridine  (PYRIDIUM ) 95 MG tablet Take 95 mg by mouth 3 (three) times daily as needed for pain. (Patient not taking:  Reported on 11/17/2023)     Probiotic Product (PROBIOTIC BLEND PO)  (Patient not taking: Reported on 11/17/2023)     rosuvastatin  (CRESTOR ) 5 MG tablet Take  1 tablet 3 x /week for Cholesterol                                                         /                    TAKE                                     BY                                  MOUTH (Patient taking differently: Taking 1/2 tablet 3 x week) 39 tablet 3   traZODone  (DESYREL ) 50 MG tablet Take 1 tablet (50 mg total) by mouth at bedtime. 90 tablet 1   No current facility-administered medications on file prior to visit.   Subjective: Discussed the use of AI scribe software for clinical note transcription with the patient, who gave verbal consent to proceed.   73 Y O Female with prior h/o as below including anxiety/adhd/depression, CHF, emphysema, GERD, Hypothyroidism, HLD, HTN, TIAs, H pylori infection, OSA, IBS, IC, Overactive bladder who is referred from OBG for concerns of recurrent UTIs.  She has prior h/o posterior repair, sacrospinous ligament fixation, midurethral sling, cystoscopy in 11/10/21, intra-vesical botox  on 08/13/22 with no significant improvement. She also had rectopexy done on 08/09/23 with cystoscopy with hydrodistension with Dr Darol at [place] with no improvement of symptoms. H/o DMSO instillations with no improvement.   Accompanied by daughter. She reports having experienced approximately eight episodes of urinary tract infections since January. She was recently seen in the ED 7/16  for urinary retention, which she attributes to starting trospium . UA with large leukocytes, negative nitrite, LE+. Urine cx grew 30,000 colonies of E coli. She was seen in the ED on 7/14 for cystitis and was discharged on macrobid .  She described interstitial cystitis symptoms include burning, urinary incontinence, and variable ability to urinate, with recent episodes of incontinence and burning.  She had difficulty urinating last 2 days,  urgency and feeling of incomplete bladder emptying but today seems OK.   She stopped taking all medications due to adverse effects from trospium  and has not resumed methenamine . She reports she cannot take cranberry due to her IC diet.    Denies fever, chills, nausea, vomiting, back pain, or flank pain. She has not had bladder surgeries other than rectopexy and hydrodistention. Denies h/o kidney stones.   Denies fevers, chills. Denies nausea, vomiting and malaise.   Uses weeds on and off but  no smoking, alcohol and recreational drugs.   She is asking for analgesics for her pain management   Review of Systems: all systems reviewed with pertinent positives and negatives as listed above   Past Medical History:  Diagnosis Date   ADHD (attention deficit hyperactivity disorder) 07/24/2019   Allergy    Anemia 2024   Anxiety    Cataract    Chronic diastolic CHF (congestive heart failure) (HCC)    cardriologist--- dr lelon. barbaraann;   dx in setting sepsis 11/ 2020;   echo in epic 03-10-2019 ef 55-60%, G1DD, mild to moderate TR no stenosis and normal RASP;  CTA 01-31-2021 epic   Chronic kidney disease (CKD), stage III (moderate) (HCC)    nephrologist--- dr val   Depression    Diverticulosis of colon    Emphysema of lung (HCC)    no home oxygen   GERD (gastroesophageal reflux disease)    History of acute pyelonephritis    w/ hx sepsis   History of Helicobacter pylori infection 2012   History of recurrent UTIs    History of TIA (transient ischemic attack)    TIAs   Hyperlipidemia, mixed    Hypertension    followed by pcp and nephrologist----  (nuclear stress test in epic 06-28-2015 low risk no ischemia, nuclear ef 60%) no meds   Hypothyroidism    IBS (irritable bowel syndrome)    IC (interstitial cystitis)    remote hx   Inguinal hernia, bilateral    Irritable bowel syndrome with constipation and diarrhea    LBBB (left bundle branch block) 2015   cardiologist --- dr barbaraann;  known  chronic LBBB for several yrs   OCD (obsessive compulsive disorder)    OSA (obstructive sleep apnea)    no CPAP intolerant   Osteopenia    Prolapsed hemorrhoids    Seasonal allergies    Sleep apnea    Vitamin D  deficiency    Past Surgical History:  Procedure Laterality Date   ABDOMINAL HYSTERECTOMY  2000   ANTERIOR AND POSTERIOR REPAIR WITH SACROSPINOUS FIXATION N/A 11/10/2021   Procedure: POSTERIOR REPAIR WITH SACROSPINOUS FIXATION;  Surgeon: Marilynne Rosaline SAILOR, MD;  Location: [facility];  Service: Gynecology;  Laterality: N/A;  total time requested is 1.5 hrs   BLADDER SUSPENSION N/A 11/10/2021   Procedure: TRANSVAGINAL TAPE (TVT) PROCEDURE;  Surgeon: Marilynne Rosaline SAILOR, MD;  Location: [facility];  Service: Gynecology;  Laterality: N/A;   BREAST ENHANCEMENT SURGERY Bilateral    1970s   BREAST IMPLANT REMOVAL Bilateral 03/07/2019   BREAST SURGERY  2020   Removal breast implants   BUNIONECTOMY Bilateral 1997   CATARACT EXTRACTION W/ INTRAOCULAR LENS IMPLANT Right 2005   COLONOSCOPY  10/2020   COSMETIC SURGERY  1976   Breast implants/now removed   CYSTOSCOPY WITH FULGERATION  11/10/2021   Procedure: CYSTOSCOPY WITH FULGERATION;  Surgeon: Marilynne Rosaline SAILOR, MD;  Location: [facility];  Service: Gynecology;;   ESOPHAGOGASTRODUODENOSCOPY (EGD) WITH PROPOFOL  N/A 03/12/2019   Procedure: ESOPHAGOGASTRODUODENOSCOPY (EGD) WITH PROPOFOL ;  Surgeon: Rollin Dover, MD;  Location: [facility] ENDOSCOPY;  Service: Endoscopy;  Laterality: N/A;   EYE SURGERY     Cataract r eye   FRACTURE SURGERY  2019   Leg ankle foot left   HEMORRHOID SURGERY  1976   HERNIA REPAIR  2020   R and L inguinal   INGUINAL HERNIA REPAIR N/A 04/10/2021   Procedure: LAPAROSCOPIC LEFT INGUINAL HERNIA REPAIR AND RIGHT INGUINAL HERNII REPAIR WITH TAP  BLOCK;  Surgeon: Sheldon Standing, MD;  Location: [facility];  Service: General;  Laterality: N/A;   ORIF ANKLE  FRACTURE Left 08/30/2017   Procedure: OPEN REDUCTION INTERNAL FIXATION (ORIF) ANKLE FRACTURE;  Surgeon: Fidel Rogue, MD;  Location: [facility] OR;  Service: Orthopedics;  Laterality: Left;   TONSILLECTOMY     CHILD   TUBAL LIGATION  1976     Social History   Tobacco Use   Smoking status: Former    Current packs/day: 0.00    Average packs/day: 1 pack/day for 40.0 years (40.0 ttl pk-yrs)    Types: Cigarettes    Start date: 1964    Quit date: 2004    Years since quitting: 21.6    Passive exposure: Past   Smokeless tobacco: Never  Vaping Use   Vaping status: Never Used  Substance Use Topics   Alcohol use: Not Currently    Comment: rare   Drug use: Never    Family History  Problem Relation Age of Onset   Colon polyps Mother    Muscular dystrophy Mother        late 36s   Depression Mother    Early death Mother    Obesity Mother    Alcohol abuse Father    Depression Father    Early death Father    Hypertension Sister    Alcohol abuse Sister    Miscarriages / Stillbirths Sister    Muscular dystrophy Maternal Grandmother        50   Heart block Maternal Grandmother    Heart disease Maternal Grandmother    Stroke Maternal Grandmother    Diabetes Maternal Grandfather    ADD / ADHD Son    Cancer Son    Colon cancer Neg Hx    Esophageal cancer Neg Hx    Rectal cancer Neg Hx    Stomach cancer Neg Hx     Allergies  Allergen Reactions   Ace Inhibitors Other (See Comments)    Acute renal failure (lisinopril )   Cephalexin Anaphylaxis   Levaquin  [Levofloxacin ] Nausea And Vomiting   Bactrim  [Sulfamethoxazole -Trimethoprim ] Itching and Rash   Ciprofloxacin  Rash   Codeine Nausea Only   Latex Rash    Health Maintenance  Topic Date Due   COVID-19 Vaccine (5 - 2024-25 season) 12/27/2022   DTaP/Tdap/Td (3 - Tdap) 04/28/2023   MAMMOGRAM  11/19/2023   DEXA SCAN  11/19/2023   INFLUENZA VACCINE  11/26/2023   Medicare Annual Wellness (AWV)  10/19/2024   Colonoscopy  11/14/2025    Pneumococcal Vaccine: 50+ Years  Completed   Hepatitis C Screening  Completed   Zoster Vaccines- Shingrix  Completed   Hepatitis B Vaccines  Aged Out   HPV VACCINES  Aged Out   Meningococcal B Vaccine  Aged Out    Objective: BP 135/65   Pulse (!) 57   Temp 98.8 F (37.1 C) (Oral)   Wt 151 lb (68.5 kg)   SpO2 96%   BMI 26.75 kg/m    Physical Exam Constitutional:      Appearance: Normal appearance.  HENT:     Head: Normocephalic and atraumatic.      Mouth: Mucous membranes are moist.  Eyes:    Conjunctiva/sclera: Conjunctivae normal.     Pupils: Pupils are equal, round, and b/l symmetrical    Cardiovascular:     Rate and Rhythm: Normal rate and regular rhythm.     Heart sounds: s1s2  Pulmonary:     Effort: Pulmonary effort is  normal.     Breath sounds: Normal breath sounds.   Abdominal:     General: Non distended     Palpations: soft.   Musculoskeletal:        General: Normal range of motion.   Skin:    General: Skin is warm and dry.     Comments:  Neurological:     General: grossly non focal     Mental Status: awake, alert and oriented to person, place, and time.   Psychiatric:        Mood and Affect: Mood normal.   Lab Results Lab Results  Component Value Date   WBC 10.4 11/10/2023   HGB 10.5 (L) 11/10/2023   HCT 31.0 (L) 11/10/2023   MCV 89.4 11/10/2023   PLT 249 11/10/2023    Lab Results  Component Value Date   CREATININE 1.17 (H) 11/10/2023   BUN 22 11/10/2023   NA 140 11/10/2023   K 3.2 (L) 11/10/2023   CL 97 (L) 11/10/2023   CO2 26 11/10/2023    Lab Results  Component Value Date   ALT 16 04/05/2023   AST 20 04/05/2023   ALKPHOS 51 04/05/2023   BILITOT 0.5 04/05/2023    Lab Results  Component Value Date   CHOL 136 03/18/2023   HDL 66 03/18/2023   LDLCALC 54 03/18/2023   TRIG 77 03/18/2023   CHOLHDL 2.1 03/18/2023   Lab Results  Component Value Date   LABRPR REACTIVE (A) 12/23/2020   RPRTITER 1:2 (H) 12/23/2020   No  results found for: HIV1RNAQUANT, HIV1RNAVL, CD4TABS   Microbiology Results for orders placed or performed during the hospital encounter of 11/08/23  Urine Culture     Status: Abnormal   Collection Time: 11/08/23  6:39 PM   Specimen: Urine, Clean Catch  Result Value Ref Range Status   Specimen Description URINE, CLEAN CATCH  Final   Special Requests   Final    NONE Performed at [facility], [address] [place], [place] [address]    Culture 30,000 COLONIES/mL ESCHERICHIA COLI (A)  Final   Report Status 11/10/2023 FINAL  Final   Organism ID, Bacteria ESCHERICHIA COLI (A)  Final      Susceptibility   Escherichia coli - MIC*    AMPICILLIN 4 SENSITIVE Sensitive     CEFAZOLIN  <=4 SENSITIVE Sensitive     CEFEPIME <=0.12 SENSITIVE Sensitive     CEFTRIAXONE <=0.25 SENSITIVE Sensitive     CIPROFLOXACIN  <=0.25 SENSITIVE Sensitive     GENTAMICIN  <=1 SENSITIVE Sensitive     IMIPENEM <=0.25 SENSITIVE Sensitive     NITROFURANTOIN  <=16 SENSITIVE Sensitive     TRIMETH /SULFA  <=20 SENSITIVE Sensitive     AMPICILLIN/SULBACTAM <=2 SENSITIVE Sensitive     PIP/TAZO <=4 SENSITIVE Sensitive ug/mL    * 30,000 COLONIES/mL ESCHERICHIA COLI   Imaging 04/05/23 CT abdomen/pelvis  IMPRESSION: *There is subtle asymmetric fat stranding surrounding the left wall of the urinary bladder. Findings can be seen with cystitis. Correlate clinically and with urinalysis. *Otherwise, no acute inflammatory process identified within the abdomen or pelvis. No bowel obstruction. *Mild rectal prolapse. *Multiple other nonacute observations, as described above.  Aortic Atherosclerosis (ICD10-I70.0).   Assessment/Plan # Dysuria/Burning  Plan  - Unclear if her symptoms are due to UTI or IC. Will get UA and urine cx today - Will hold off on antibiotics pending UA as hard to tell if symptoms are truly due to UTI or underlying IC/other urinary issues - Discussed about preventive measures  like adequate water   intake, vaginal estrogen cream, management of IC etc  - Agree with methenamine  for UTI prevention but will need to take with Vit C for good effect - Defer PO suppressive antibiotics  - fu pending above    # Interstitial cystitis # Overactive bladder with incontinence  - following Dr Marilynne, referred for pelvic PT for pelvic floor muscle spasm, referred to [facility] for pain management, plan for PTNS noted  - Pain management per them   I spent 60 minutes involved in face-to-face and non-face-to-face activities for this patient on the day of the visit. Professional time spent includes the following activities: Preparing to see the patient (review of tests), Obtaining and reviewing separately obtained history ( ED notes 7/14, 7/16, Dr Marilynne note 7/23), Performing a medically appropriate examination and evaluation , Ordering labs, Documenting clinical information in the EMR, Independently interpreting results (not separately reported), Communicating results to the patient/daughter, Counseling and educating the patient and Care coordination (not separately reported).   Of note, portions of this note may have been created with voice recognition software. While this note has been edited for accuracy, occasional wrong-word or 'sound-a-like' substitutions may have occurred due to the inherent limitations of voice recognition software.   Annalee Joseph, MD [facility] 11/29/2023, 7:55 AM

## 2023-12-01 ENCOUNTER — Ambulatory Visit: Payer: Self-pay | Admitting: Infectious Diseases

## 2023-12-01 LAB — URINALYSIS W MICROSCOPIC + REFLEX CULTURE
Bacteria, UA: NONE SEEN /HPF
Bilirubin Urine: NEGATIVE
Glucose, UA: NEGATIVE
Hyaline Cast: NONE SEEN /LPF
Ketones, ur: NEGATIVE
Nitrites, Initial: NEGATIVE
Specific Gravity, Urine: 1.015 (ref 1.001–1.035)
Squamous Epithelial / HPF: NONE SEEN /HPF (ref <=5)
WBC, UA: >=60 /HPF — AB (ref 0–5)
pH: 5.5 (ref 5.0–8.0)

## 2023-12-01 LAB — URINE CULTURE
MICRO NUMBER:: 16784056
Result:: NO GROWTH
SPECIMEN QUALITY:: ADEQUATE

## 2023-12-01 LAB — CULTURE INDICATED

## 2023-12-07 ENCOUNTER — Ambulatory Visit: Admitting: Obstetrics and Gynecology

## 2023-12-07 ENCOUNTER — Other Ambulatory Visit (HOSPITAL_BASED_OUTPATIENT_CLINIC_OR_DEPARTMENT_OTHER): Payer: Self-pay | Admitting: Family Medicine

## 2023-12-07 ENCOUNTER — Encounter: Payer: Self-pay | Admitting: Obstetrics and Gynecology

## 2023-12-07 VITALS — BP 110/66 | HR 69

## 2023-12-07 DIAGNOSIS — N3281 Overactive bladder: Secondary | ICD-10-CM

## 2023-12-07 DIAGNOSIS — N3010 Interstitial cystitis (chronic) without hematuria: Secondary | ICD-10-CM

## 2023-12-07 DIAGNOSIS — R350 Frequency of micturition: Secondary | ICD-10-CM

## 2023-12-07 NOTE — Progress Notes (Signed)
  Urogynecology  PTNS VISIT  CC:  Overactive bladder  73 y.o. with refractory overactive bladder who presents for percutaneous tibial nerve stimulation. The patient presents for PTNS session # 1.  Average leakage 7 times per day Urgency hourly 9 pads on average daily       Procedure: The patient was placed in the sitting position and the right lower extremity was prepped in the usual fashion. The PTNS needle was then inserted at a 60 degree angle, 5 cm cephalad and 2 cm posterior to the medial malleolus. The PTNS unit was then programmed and an optimal response was noted at 6 milliamps. The PTNS stimulation was then performed at this setting for 30 minutes without incident and the patient tolerated the procedure well. The needle was removed and hemostasis was noted.   The pt will return in 1 week for PTNS session # 2. All questions were answered.

## 2023-12-15 ENCOUNTER — Encounter: Payer: Self-pay | Admitting: Obstetrics and Gynecology

## 2023-12-15 ENCOUNTER — Ambulatory Visit: Admitting: Obstetrics and Gynecology

## 2023-12-15 VITALS — BP 118/70 | HR 78

## 2023-12-15 DIAGNOSIS — N3281 Overactive bladder: Secondary | ICD-10-CM

## 2023-12-15 DIAGNOSIS — R350 Frequency of micturition: Secondary | ICD-10-CM

## 2023-12-15 DIAGNOSIS — N3010 Interstitial cystitis (chronic) without hematuria: Secondary | ICD-10-CM

## 2023-12-15 NOTE — Progress Notes (Signed)
 [facility]  PTNS VISIT  CC:  Overactive bladder  73 y.o. with refractory overactive bladder who presents for percutaneous tibial nerve stimulation. The patient presents for PTNS session # 2.   Procedure: The patient was placed in the sitting position and the right lower extremity was prepped in the usual fashion. The PTNS needle was then inserted at a 60 degree angle, 5 cm cephalad and 2 cm posterior to the medial malleolus. The PTNS unit was then programmed and an optimal response was noted at 19 milliamps. The PTNS stimulation was then performed at this setting for 30 minutes without incident and the patient tolerated the procedure well. The needle was removed and hemostasis was noted.   The pt will return in 1 week for PTNS session # 3. All questions were answered.

## 2023-12-17 ENCOUNTER — Telehealth: Payer: Self-pay | Admitting: Family Medicine

## 2023-12-17 NOTE — Telephone Encounter (Signed)
ok with me

## 2023-12-17 NOTE — Telephone Encounter (Signed)
 Patient requesting TOC from Dr. Penne to [facility]. Please advise

## 2023-12-28 ENCOUNTER — Ambulatory Visit (INDEPENDENT_AMBULATORY_CARE_PROVIDER_SITE_OTHER): Admitting: Obstetrics and Gynecology

## 2023-12-28 ENCOUNTER — Encounter: Payer: Self-pay | Admitting: Obstetrics and Gynecology

## 2023-12-28 VITALS — BP 122/74 | HR 57

## 2023-12-28 DIAGNOSIS — N3010 Interstitial cystitis (chronic) without hematuria: Secondary | ICD-10-CM

## 2023-12-28 DIAGNOSIS — N3281 Overactive bladder: Secondary | ICD-10-CM | POA: Diagnosis not present

## 2023-12-28 NOTE — Progress Notes (Signed)
 [facility]  PTNS VISIT  CC:  Overactive bladder  73 y.o. with refractory overactive bladder who presents for percutaneous tibial nerve stimulation. The patient presents for PTNS session # 3.  Patient reports decrease in her urgency and accident numbers and is seeing slight improvement.   Procedure: The patient was placed in the sitting position and the right lower extremity was prepped in the usual fashion. The PTNS needle was then inserted at a 60 degree angle, 5 cm cephalad and 2 cm posterior to the medial malleolus. The PTNS unit was then programmed and an optimal response was noted at 17 milliamps. The PTNS stimulation was then performed at this setting for 30 minutes without incident and the patient tolerated the procedure well. The needle was removed and hemostasis was noted.   The pt will return in 1 week for PTNS session # 4. All questions were answered.

## 2024-01-04 ENCOUNTER — Ambulatory Visit (INDEPENDENT_AMBULATORY_CARE_PROVIDER_SITE_OTHER): Admitting: Obstetrics and Gynecology

## 2024-01-04 VITALS — BP 100/66 | HR 89

## 2024-01-04 DIAGNOSIS — N3010 Interstitial cystitis (chronic) without hematuria: Secondary | ICD-10-CM

## 2024-01-04 DIAGNOSIS — N3281 Overactive bladder: Secondary | ICD-10-CM

## 2024-01-04 DIAGNOSIS — R350 Frequency of micturition: Secondary | ICD-10-CM

## 2024-01-04 DIAGNOSIS — M62838 Other muscle spasm: Secondary | ICD-10-CM

## 2024-01-04 MED ORDER — CYCLOBENZAPRINE HCL 5 MG PO TABS: 5.0000 mg | ORAL_TABLET | Freq: Three times a day (TID) | ORAL | 1 refills | Status: DC | PRN

## 2024-01-04 NOTE — Progress Notes (Signed)
 [facility]  PTNS VISIT  CC:  Overactive bladder  72 y.o. with refractory overactive bladder who presents for percutaneous tibial nerve stimulation. The patient presents for PTNS session # 4.  She reports still a mild improvement but is still having IC pain.   Patient is not getting support for the robaxin  and we discussed switching to flexeril . New prescription for flexeril  sent in to pharmacy.   Procedure: The patient was placed in the sitting position and the right lower extremity was prepped in the usual fashion. The PTNS needle was then inserted at a 60 degree angle, 5 cm cephalad and 2 cm posterior to the medial malleolus. The PTNS unit was then programmed and an optimal response was noted at 10 milliamps. The PTNS stimulation was then performed at this setting for 30 minutes without incident and the patient tolerated the procedure well. The needle was removed and hemostasis was noted.   The pt will return in 1 week for PTNS session # 5. All questions were answered.

## 2024-01-05 ENCOUNTER — Ambulatory Visit: Admitting: Obstetrics and Gynecology

## 2024-01-07 DIAGNOSIS — M7918 Myalgia, other site: Secondary | ICD-10-CM | POA: Diagnosis not present

## 2024-01-07 DIAGNOSIS — N958 Other specified menopausal and perimenopausal disorders: Secondary | ICD-10-CM | POA: Diagnosis not present

## 2024-01-07 DIAGNOSIS — R350 Frequency of micturition: Secondary | ICD-10-CM | POA: Diagnosis not present

## 2024-01-11 ENCOUNTER — Ambulatory Visit (INDEPENDENT_AMBULATORY_CARE_PROVIDER_SITE_OTHER): Admitting: Obstetrics and Gynecology

## 2024-01-11 ENCOUNTER — Encounter: Payer: Self-pay | Admitting: Obstetrics and Gynecology

## 2024-01-11 VITALS — BP 101/70 | HR 69

## 2024-01-11 DIAGNOSIS — N3281 Overactive bladder: Secondary | ICD-10-CM | POA: Diagnosis not present

## 2024-01-11 DIAGNOSIS — R350 Frequency of micturition: Secondary | ICD-10-CM

## 2024-01-11 DIAGNOSIS — N3010 Interstitial cystitis (chronic) without hematuria: Secondary | ICD-10-CM

## 2024-01-11 NOTE — Progress Notes (Signed)
 [facility]  PTNS VISIT  CC:  Overactive bladder  73 y.o. with refractory overactive bladder who presents for percutaneous tibial nerve stimulation. The patient presents for PTNS session # 5.   Procedure: The patient was placed in the sitting position and the right lower extremity was prepped in the usual fashion. The PTNS needle was then inserted at a 60 degree angle, 5 cm cephalad and 2 cm posterior to the medial malleolus. The PTNS unit was then programmed and an optimal response was noted at 3 milliamps. The PTNS stimulation was then performed at this setting for 30 minutes without incident and the patient tolerated the procedure well. The needle was removed and hemostasis was noted.   The pt will return in 1 week for PTNS session # 6. All questions were answered.

## 2024-01-18 ENCOUNTER — Ambulatory Visit (INDEPENDENT_AMBULATORY_CARE_PROVIDER_SITE_OTHER): Admitting: Obstetrics and Gynecology

## 2024-01-18 ENCOUNTER — Encounter: Payer: Self-pay | Admitting: Obstetrics and Gynecology

## 2024-01-18 ENCOUNTER — Ambulatory Visit (INDEPENDENT_AMBULATORY_CARE_PROVIDER_SITE_OTHER): Admitting: Family

## 2024-01-18 ENCOUNTER — Encounter: Payer: Self-pay | Admitting: Family

## 2024-01-18 VITALS — BP 108/69 | HR 89 | Temp 97.3°F | Ht 63.0 in | Wt 142.6 lb

## 2024-01-18 VITALS — BP 97/65 | HR 80

## 2024-01-18 DIAGNOSIS — N3281 Overactive bladder: Secondary | ICD-10-CM | POA: Diagnosis not present

## 2024-01-18 DIAGNOSIS — F32A Depression, unspecified: Secondary | ICD-10-CM | POA: Diagnosis not present

## 2024-01-18 DIAGNOSIS — E861 Hypovolemia: Secondary | ICD-10-CM | POA: Diagnosis not present

## 2024-01-18 DIAGNOSIS — N3010 Interstitial cystitis (chronic) without hematuria: Secondary | ICD-10-CM | POA: Diagnosis not present

## 2024-01-18 DIAGNOSIS — R350 Frequency of micturition: Secondary | ICD-10-CM

## 2024-01-18 DIAGNOSIS — E039 Hypothyroidism, unspecified: Secondary | ICD-10-CM

## 2024-01-18 LAB — TSH: TSH: 0.12 u[IU]/mL — ABNORMAL LOW (ref 0.35–5.50)

## 2024-01-18 LAB — T4, FREE: Free T4: 0.88 ng/dL (ref 0.60–1.60)

## 2024-01-18 NOTE — Patient Instructions (Addendum)
 Welcome to [facility], It was a pleasure meeting you today!  I will review your lab results via MyChart in a few days.  As discussed, I will send your thyroid  med after I review your labs.  Please schedule a physical with fasting labs when convenient.   PLEASE NOTE: If you had any LAB tests please let us  know if you have not heard back within a few days. You may see your results on MyChart before we have a chance to review them but we will give you a call once they are reviewed by us . If we ordered any REFERRALS today, please let us  know if you have not heard from their office within the next week.  Let us  know through MyChart if you are needing REFILLS, or have your pharmacy send us  the request. You can also use MyChart to communicate with me or any office staff.  Please try these tips to maintain a healthy lifestyle: It is important that you exercise regularly at least 30 minutes 5 times a week. Think about what you will eat, plan ahead. Choose whole foods, & think  clean, green, fresh or frozen over canned, processed or packaged foods which are more sugary, salty, and fatty. 70 to 75% of food eaten should be fresh vegetables and protein. 2-3  meals daily with healthy snacks between meals, but must be whole fruit, protein or vegetables. Aim to eat over a 10 hour period when you are active, for example, 7am to 5pm, and then STOP after your last meal of the day, drinking only water .  Shorter eating windows, 6-8 hours, are showing benefits in heart disease and blood sugar regulation. Drink water  every day! Shoot for 64 ounces daily = 8 cups, no other drink is as healthy! Fruit juice is best enjoyed in a healthy way, by EATING the fruit.

## 2024-01-18 NOTE — Assessment & Plan Note (Signed)
 Hypothyroidism with recent low TSH. Weight loss may affect thyroid  function. Currently taking older levothyroxine  tablets, last one yesterday, 125mcg, normal dose is 150mcg. - Order TSH and free T4. - Reassess levothyroxine  dosage based on lab results and send refill. - F/U in 1 year

## 2024-01-18 NOTE — Progress Notes (Signed)
 [facility]  PTNS VISIT  CC:  Overactive bladder  73 y.o. with refractory overactive bladder who presents for percutaneous tibial nerve stimulation. The patient presents for PTNS session # 6.   Procedure: The patient was placed in the sitting position and the right lower extremity was prepped in the usual fashion. The PTNS needle was then inserted at a 60 degree angle, 5 cm cephalad and 2 cm posterior to the medial malleolus. The PTNS unit was then programmed and an optimal response was noted at 8 milliamps. The PTNS stimulation was then performed at this setting for 30 minutes without incident and the patient tolerated the procedure well. The needle was removed and hemostasis was noted.   The pt will return in 1 week for PTNS session # 7. All questions were answered.

## 2024-01-18 NOTE — Assessment & Plan Note (Signed)
 Depression, currently on trazodone , also for sleep. Reports previously on a different antidepressant but advised to stop to avoid duplicate therapy? Currently taking a reduced dose. Reports not needing it as much but continues due to its antidepressant properties.

## 2024-01-18 NOTE — Progress Notes (Signed)
 New Patient Office Visit  Subjective:  Patient ID: [name], female    DOB: 1950/06/11  Age: 73 y.o. MRN: [id number]  CC:  Chief Complaint  Patient presents with   Transfer Of Care   HPI [name] presents for establishing care today.  Discussed the use of AI scribe software for clinical note transcription with the patient, who gave verbal consent to proceed.  History of Present Illness   [name] is a 73 year old female with interstitial cystitis who presents to establish care and for medication management.  [name] has experienced significant pain following Botox  treatment for interstitial cystitis, which left her bedridden for eight months. She had adverse reactions to several meds for IC, including urinary retention and swelling, necessitating an emergency room visit. She currently uses Pyridium  for pain during urination and experiences discomfort with physical activities due to pressure.  She recently switched to vaginal estradiol  tablets after finding the cream ineffective. This change led to increased blood pressure and hot flashes. Her blood pressure has fluctuated, reaching as high as 163/127, and last week 137-150/89-99 but has recently been lower. She monitors her blood pressure at home and notes occasional irregular heartbeats.  [name] has a history of kidney disease and has had six to eight UTIs in the past year. She takes D-mannose, aloe vera, and l-arginine to prevent UTIs and uses hydroxyzine  for histamine release associated with interstitial cystitis.  She has experienced significant weight loss, resulting in loose skin and muscle loss, and maintains an exercise routine by engaging in physical activities like gardening & recently cutting down trees at her home.  She has a history of rectal prolapse, surgically corrected, and thyroid  issues. She is currently out of her levothyroxine  medication and has been taking older 125 mcg tablets for the past two  weeks. Her family history includes a son with stage four colorectal cancer. She is divorced, lives alone, and has a daughter and son nearby.     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 Recurrent UTIs with 6-8 episodes in the past year. Concerns about antibiotic over-treatment. D-mannose and supplements used for prevention. Symptoms worsened post-Botox  treatment. Difficulty with medication absorption due to frequent urination. Physical therapy causes discomfort. Hydroxyzine  used for histamine management. - Continue estradiol  vaginal tablets. - Use pyridium  as needed for pain. - Continue hydroxyzine  for histamine management. - Continue pelvic floor physical therapy. - Consider pudendal nerve block if pain persists.  Hypertension with labile blood pressure Labile hypertension with episodes of high and low blood pressure. Possible irregular heartbeat noted. Recent physical activity and weight loss may contribute to changes. Not currently on any medication.  - Monitor blood pressure at home, notify office of any continued abnormal readings.  Chronic kidney disease, unspecified stage with history of hypokalemia Chronic kidney disease with history of hypokalemia. Recent labs in July showed potassium was a little low. - Order lab work to assess kidney function and electrolytes at upcoming CPE  Hypothyroidism Hypothyroidism with recent low TSH. Weight loss may affect thyroid  function. Currently taking older levothyroxine  tablets, last one yesterday, 125mcg, normal dose is 150mcg. - Order TSH and free T4. - Reassess levothyroxine  dosage based on lab results and send refill. - F/U in 1 year  Postmenopausal symptoms She believes her menopausal symptoms are exacerbated by recently started estradiol  vaginal tablets, causing hot flashes and increased blood pressure. Symptoms unusual as  vaginal  estradiol  is typically not absorbed systemically or only in very minimal amounts. - Monitor symptoms and blood pressure.  Prediabetes Prediabetes with last A1c at 5.7. Weight loss may have improved glycemic control. Concern about potential diabetes due to increased urination. - Consider rechecking A1c during next physical.  Depression Depression, currently on trazodone , also for sleep. Reports previously on a different antidepressant but advised to stop to avoid duplicate therapy? Currently taking a reduced dose. Reports not needing it as much but continues due to its antidepressant properties.      Subjective:    Outpatient Medications Prior to Visit  Medication Sig Dispense Refill   acetaminophen  (TYLENOL ) 325 MG tablet Take 650 mg by mouth every 6 (six) hours as needed.     ALOE VERA PO Take 1 capsule by mouth daily at 12 noon.     aspirin  EC 81 MG tablet Take 81 mg by mouth daily.      cyclobenzaprine  (FLEXERIL ) 5 MG tablet Take 1 tablet (5 mg total) by mouth 3 (three) times daily as needed for muscle spasms. 60 tablet 1   D-Mannose 500 MG CAPS Take 1,000 mg by mouth daily.     diazepam  (VALIUM ) 5 MG tablet Place 1 tablet vaginally nightly as needed for muscle spasm/ pelvic pain. 30 tablet 1   Dihydroxyaluminum Sod Carb (ROLAIDS PO) Take by mouth daily as needed.     Estradiol  10 MCG TABS vaginal tablet Place vaginally.     gabapentin  (NEURONTIN ) 600 MG tablet Take 1/2 to 1 tablet 2 to 3 x /Daily as needed for Pain 270 tablet 1   guaifenesin  (HUMIBID E) 400 MG TABS tablet Take 400 mg by mouth daily.     hydrOXYzine  (ATARAX ) 25 MG tablet Take 25 mg by mouth 3 (three) times daily.     hyoscyamine  (LEVSIN  SL) 0.125 MG SL tablet DISSOLVE 1 TABLET UNDER THE TONGUE EVERY 4 HOURS IF NEEDED FOR NAUSEA, BLOATING, CRAMPING, OR DIARRHEA 120 tablet 1   ipratropium-albuterol  (DUONEB) 0.5-2.5 (3) MG/3ML SOLN USE 1 VIAL IN NEBULIZER EVERY 4 HOURS AS NEEDED **MAX 6 DOSES A DAY** 540 mL 0    levothyroxine  (SYNTHROID ) 150 MCG tablet TAKE 1 TABLET BY MOUTH DAILY BEFORE BREAKFAST 90 tablet 0   lidocaine -prilocaine  (EMLA ) cream Apply 1 Application topically as needed. 30 g 0   Loratadine (CLARITIN PO) Take by mouth.     Magnesium  250 MG CAPS Take by mouth.     ondansetron  (ZOFRAN ) 8 MG tablet TAKE 1 TABLET BY MOUTH 3 TIMES A DAY AS NEEDED FOR NAUSEA 60 tablet 2   OVER THE COUNTER MEDICATION L -arginine     OVER THE COUNTER MEDICATION 5,000 Units. VITAMIN D      phenazopyridine  (PYRIDIUM ) 95 MG tablet Take 95 mg by mouth 3 (three) times daily as needed for pain.     Probiotic Product (PROBIOTIC BLEND PO)      traZODone  (DESYREL ) 50 MG tablet Take 1 tablet (50 mg total) by mouth at bedtime. 90 tablet 1   No facility-administered medications prior to visit.   Past Medical History:  Diagnosis Date   ADHD (attention deficit hyperactivity disorder) 07/24/2019   Allergy    Anemia 2024   Anxiety    Cataract    Chronic diastolic CHF (congestive heart failure) (HCC)    cardriologist--- dr lelon. barbaraann;   dx in setting sepsis 11/ 2020;   echo in epic 03-10-2019 ef 55-60%, G1DD, mild to moderate TR no stenosis and normal RASP;  CTA 01-31-2021 epic   Chronic kidney disease (CKD), stage III (moderate) (HCC)    nephrologist--- dr val   Depression    Difficulty urinating 01/25/2023   Diverticulosis of colon    Emphysema of lung (HCC)    no home oxygen   GERD (gastroesophageal reflux disease)    History of acute pyelonephritis    w/ hx sepsis   History of Helicobacter pylori infection 2012   History of recurrent UTIs    History of TIA (transient ischemic attack)    TIAs   Hyperlipidemia, mixed    Hypertension    followed by pcp and nephrologist----  (nuclear stress test in epic 06-28-2015 low risk no ischemia, nuclear ef 60%) no meds   Hypothyroidism    IBS (irritable bowel syndrome)    IC (interstitial cystitis)    remote hx   Inguinal hernia, bilateral    Irritable bowel  syndrome with constipation and diarrhea    LBBB (left bundle branch block) 2015   cardiologist --- dr barbaraann;  known chronic LBBB for several yrs   Medication management 06/12/2014   OCD (obsessive compulsive disorder)    OSA (obstructive sleep apnea)    no CPAP intolerant   Osteopenia    Prolapsed hemorrhoids    Seasonal allergies    Sleep apnea    Vitamin D  deficiency    Past Surgical History:  Procedure Laterality Date   ABDOMINAL HYSTERECTOMY  2000   ANTERIOR AND POSTERIOR REPAIR WITH SACROSPINOUS FIXATION N/A 11/10/2021   Procedure: POSTERIOR REPAIR WITH SACROSPINOUS FIXATION;  Surgeon: Marilynne Rosaline SAILOR, MD;  Location: [facility];  Service: Gynecology;  Laterality: N/A;  total time requested is 1.5 hrs   BLADDER SUSPENSION N/A 11/10/2021   Procedure: TRANSVAGINAL TAPE (TVT) PROCEDURE;  Surgeon: Marilynne Rosaline SAILOR, MD;  Location: [facility];  Service: Gynecology;  Laterality: N/A;   BREAST ENHANCEMENT SURGERY Bilateral    1970s   BREAST IMPLANT REMOVAL Bilateral 03/07/2019   BREAST SURGERY  2020   Removal breast implants   BUNIONECTOMY Bilateral 1997   CATARACT EXTRACTION W/ INTRAOCULAR LENS IMPLANT Right 2005   COLONOSCOPY  10/2020   COSMETIC SURGERY  1976   Breast implants/now removed   CYSTOSCOPY WITH FULGERATION  11/10/2021   Procedure: CYSTOSCOPY WITH FULGERATION;  Surgeon: Marilynne Rosaline SAILOR, MD;  Location: [facility];  Service: Gynecology;;   ESOPHAGOGASTRODUODENOSCOPY (EGD) WITH PROPOFOL  N/A 03/12/2019   Procedure: ESOPHAGOGASTRODUODENOSCOPY (EGD) WITH PROPOFOL ;  Surgeon: Rollin Dover, MD;  Location: [facility];  Service: Endoscopy;  Laterality: N/A;   EYE SURGERY     Cataract r eye   FRACTURE SURGERY  2019   Leg ankle foot left   HEMORRHOID SURGERY  1976   HERNIA REPAIR  2020   R and L inguinal   INGUINAL HERNIA REPAIR N/A 04/10/2021   Procedure: LAPAROSCOPIC LEFT INGUINAL HERNIA REPAIR AND RIGHT  INGUINAL HERNII REPAIR WITH TAP BLOCK;  Surgeon: Sheldon Standing, MD;  Location: [facility] ;  Service: General;  Laterality: N/A;   ORIF ANKLE FRACTURE Left 08/30/2017   Procedure: OPEN REDUCTION INTERNAL FIXATION (ORIF) ANKLE FRACTURE;  Surgeon: Fidel Rogue, MD;  Location: [facility] OR;  Service: Orthopedics;  Laterality: Left;   TONSILLECTOMY     CHILD   TUBAL LIGATION  1976    Objective:   Today's Vitals: BP 108/69 (BP Location: Left Arm, Patient Position: Sitting, Cuff Size: Normal)   Pulse 89   Temp (!) 97.3 F (36.3 C) (Temporal)  Ht 5' 3 (1.6 m)   Wt 142 lb 9.6 oz (64.7 kg)   SpO2 97%   BMI 25.26 kg/m   Physical Exam Vitals and nursing note reviewed.  Constitutional:      Appearance: Normal appearance.  Cardiovascular:     Rate and Rhythm: Normal rate and regular rhythm.  Pulmonary:     Effort: Pulmonary effort is normal.     Breath sounds: Normal breath sounds.  Musculoskeletal:        General: Normal range of motion.  Skin:    General: Skin is warm and dry.  Neurological:     Mental Status: She is alert.  Psychiatric:        Mood and Affect: Mood normal.        Behavior: Behavior normal.    Lucius Krabbe, NP

## 2024-01-18 NOTE — Assessment & Plan Note (Signed)
 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 Recurrent UTIs with 6-8 episodes in the past year. Concerns about antibiotic over-treatment. D-mannose and supplements used for prevention. Symptoms worsened post-Botox  treatment. Difficulty with medication absorption due to frequent urination. Physical therapy causes discomfort. Hydroxyzine  used for histamine management. - Continue estradiol  vaginal tablets. - Use pyridium  as needed for pain. - Continue hydroxyzine  for histamine management. - Continue pelvic floor physical therapy. - Consider pudendal nerve block if pain persists.

## 2024-01-20 ENCOUNTER — Encounter: Payer: Self-pay | Admitting: Family

## 2024-01-21 ENCOUNTER — Ambulatory Visit: Payer: Self-pay | Admitting: Family

## 2024-01-25 ENCOUNTER — Encounter: Payer: Self-pay | Admitting: Obstetrics and Gynecology

## 2024-01-25 ENCOUNTER — Ambulatory Visit (INDEPENDENT_AMBULATORY_CARE_PROVIDER_SITE_OTHER): Admitting: Obstetrics and Gynecology

## 2024-01-25 VITALS — BP 122/74 | HR 71

## 2024-01-25 DIAGNOSIS — N3010 Interstitial cystitis (chronic) without hematuria: Secondary | ICD-10-CM

## 2024-01-25 DIAGNOSIS — N3281 Overactive bladder: Secondary | ICD-10-CM

## 2024-01-25 DIAGNOSIS — R350 Frequency of micturition: Secondary | ICD-10-CM

## 2024-01-25 NOTE — Progress Notes (Signed)
 [facility]  PTNS VISIT  CC:  Overactive bladder  73 y.o. with refractory overactive bladder who presents for percutaneous tibial nerve stimulation. The patient presents for PTNS session # 7.  This week has not been great pain wise for patient and she believes she is having a flare of pain. She reports her OAB symptoms have still overall had a good impact with PTNS.   Procedure: The patient was placed in the sitting position and the right lower extremity was prepped in the usual fashion. The PTNS needle was then inserted at a 60 degree angle, 5 cm cephalad and 2 cm posterior to the medial malleolus. The PTNS unit was then programmed and an optimal response was noted at 8 milliamps. The PTNS stimulation was then performed at this setting for 30 minutes without incident and the patient tolerated the procedure well. The needle was removed and hemostasis was noted.   The pt will return in 1 week for PTNS session # 8. All questions were answered.

## 2024-02-09 ENCOUNTER — Ambulatory Visit: Admitting: Obstetrics and Gynecology

## 2024-02-09 VITALS — BP 118/73 | HR 67

## 2024-02-09 DIAGNOSIS — N3281 Overactive bladder: Secondary | ICD-10-CM | POA: Diagnosis not present

## 2024-02-09 DIAGNOSIS — N3010 Interstitial cystitis (chronic) without hematuria: Secondary | ICD-10-CM

## 2024-02-09 DIAGNOSIS — R350 Frequency of micturition: Secondary | ICD-10-CM

## 2024-02-09 NOTE — Progress Notes (Signed)
 [facility]  PTNS VISIT  CC:  Overactive bladder  72 y.o. with refractory overactive bladder who presents for percutaneous tibial nerve stimulation. The patient presents for PTNS session # 8.  Patient reports she recently re-started her [place] aloe vera. She states she has not had pain in x4 days and she is also off her psychiatric medications and trazodone  for sleep. She states she is going to the bathroom more frequently again, but she is happy with the pain results.    Procedure: The patient was placed in the sitting position and the right lower extremity was prepped in the usual fashion. The PTNS needle was then inserted at a 60 degree angle, 5 cm cephalad and 2 cm posterior to the medial malleolus. The PTNS unit was then programmed and an optimal response was noted at 8 milliamps. The PTNS stimulation was then performed at this setting for 30 minutes without incident and the patient tolerated the procedure well. The needle was removed and hemostasis was noted.   The pt will return in 1 week for PTNS session # 9. All questions were answered.

## 2024-02-10 ENCOUNTER — Encounter: Payer: Self-pay | Admitting: Family

## 2024-02-10 ENCOUNTER — Ambulatory Visit (INDEPENDENT_AMBULATORY_CARE_PROVIDER_SITE_OTHER): Admitting: Family

## 2024-02-10 VITALS — BP 124/82 | HR 74 | Temp 97.3°F | Ht 63.0 in | Wt 147.5 lb

## 2024-02-10 DIAGNOSIS — N3010 Interstitial cystitis (chronic) without hematuria: Secondary | ICD-10-CM

## 2024-02-10 DIAGNOSIS — Z0000 Encounter for general adult medical examination without abnormal findings: Secondary | ICD-10-CM

## 2024-02-10 DIAGNOSIS — Z780 Asymptomatic menopausal state: Secondary | ICD-10-CM

## 2024-02-10 DIAGNOSIS — Z1231 Encounter for screening mammogram for malignant neoplasm of breast: Secondary | ICD-10-CM

## 2024-02-10 DIAGNOSIS — G5603 Carpal tunnel syndrome, bilateral upper limbs: Secondary | ICD-10-CM | POA: Diagnosis not present

## 2024-02-10 LAB — COMPREHENSIVE METABOLIC PANEL WITH GFR
ALT: 13 U/L (ref 0–35)
AST: 16 U/L (ref 0–37)
Albumin: 3.7 g/dL (ref 3.5–5.2)
Alkaline Phosphatase: 67 U/L (ref 39–117)
BUN: 36 mg/dL — ABNORMAL HIGH (ref 6–23)
CO2: 28 meq/L (ref 19–32)
Calcium: 9.1 mg/dL (ref 8.4–10.5)
Chloride: 103 meq/L (ref 96–112)
Creatinine, Ser: 1.07 mg/dL (ref 0.40–1.20)
GFR: 51.85 mL/min — ABNORMAL LOW (ref >60.00)
Glucose, Bld: 89 mg/dL (ref 70–99)
Potassium: 4 meq/L (ref 3.5–5.1)
Sodium: 137 meq/L (ref 135–145)
Total Bilirubin: 0.5 mg/dL (ref 0.2–1.2)
Total Protein: 6.7 g/dL (ref 6.0–8.3)

## 2024-02-10 LAB — CBC WITH DIFFERENTIAL/PLATELET
Basophils Absolute: 0 K/uL (ref 0.0–0.1)
Basophils Relative: 0.9 % (ref 0.0–3.0)
Eosinophils Absolute: 0.3 K/uL (ref 0.0–0.7)
Eosinophils Relative: 8.5 % — ABNORMAL HIGH (ref 0.0–5.0)
HCT: 36 % (ref 36.0–46.0)
Hemoglobin: 12 g/dL (ref 12.0–15.0)
Lymphocytes Relative: 37.9 % (ref 12.0–46.0)
Lymphs Abs: 1.5 K/uL (ref 0.7–4.0)
MCHC: 33.2 g/dL (ref 30.0–36.0)
MCV: 94.5 fl (ref 78.0–100.0)
Monocytes Absolute: 0.3 K/uL (ref 0.1–1.0)
Monocytes Relative: 7.6 % (ref 3.0–12.0)
Neutro Abs: 1.8 K/uL (ref 1.4–7.7)
Neutrophils Relative %: 45.1 % (ref 43.0–77.0)
Platelets: 274 K/uL (ref 150.0–400.0)
RBC: 3.81 Mil/uL — ABNORMAL LOW (ref 3.87–5.11)
RDW: 14 % (ref 11.5–15.5)
WBC: 4 K/uL (ref 4.0–10.5)

## 2024-02-10 LAB — POCT URINALYSIS DIPSTICK
Bilirubin, UA: NEGATIVE
Blood, UA: POSITIVE — AB
Glucose, UA: NEGATIVE
Ketones, UA: NEGATIVE
Nitrite, UA: NEGATIVE
Protein, UA: NEGATIVE
Spec Grav, UA: 1.015 (ref 1.010–1.025)
Urobilinogen, UA: 0.2 U/dL
pH, UA: 6 (ref 5.0–8.0)

## 2024-02-10 LAB — LIPID PANEL
Cholesterol: 167 mg/dL (ref 0–200)
HDL: 60.6 mg/dL (ref >39.00)
LDL Cholesterol: 98 mg/dL (ref 0–99)
NonHDL: 106.21
Total CHOL/HDL Ratio: 3
Triglycerides: 42 mg/dL (ref 0.0–149.0)
VLDL: 8.4 mg/dL (ref 0.0–40.0)

## 2024-02-10 LAB — VITAMIN D 25 HYDROXY (VIT D DEFICIENCY, FRACTURES): VITD: 64.93 ng/mL (ref 30.00–100.00)

## 2024-02-10 NOTE — Patient Instructions (Addendum)
 It was very nice to see you today!   I will review your lab results via MyChart or phone call in a few days.  Enjoy the fall weather :-)     PLEASE NOTE:  If you had any lab tests please let us  know if you have not heard back within a few days. You may see your results on MyChart before we have a chance to review them but we will give you a call once they are reviewed by us . If we ordered any referrals today, please let us  know if you have not heard from their office within the next week.

## 2024-02-10 NOTE — Progress Notes (Signed)
 Phone [phone]  Subjective:   Patient is a 73 y.o. female presenting for annual physical.    Chief Complaint  Patient presents with   Annual Exam    Fasting w/ labs    Interstitial cystitis    Pt c/o vaginal pain, urinary frequency and dysuria. Present since last night. Has tried oxycodone , takes pyridium  daily.   Discussed the use of AI scribe software for clinical note transcription with the patient, who gave verbal consent to proceed.  History of Present Illness [name] is a 73 year old female with interstitial cystitis who presents with worsening urinary pain and frequency and to get her physical with labs.  She experiences severe urinary pain and frequency, which worsened last night, causing her to be up all night. The pain is excruciating, leading her to take oxycodone  for relief. She uses Pyridium  daily to manage the pain, but had stopped 3 days ago to not affect her urinalysis today. The pain sometimes subsides but returns with a vengeance, especially after consuming certain foods. Previous treatments include Elavil , amitriptyline , and trospium , which led to urinary retention and infection. Vaginal Valium  and estrogen tablets have been used, but Pyridium  is most effective. She has experienced urinary retention requiring catheterization, which was painful and led to an infection.  She has symptoms of carpal tunnel syndrome, including tingling and numbness in her hands, especially at night.  Soreness in her fingers and a trigger finger, which she manually adjusts, are noted. Symptoms are exacerbated by activities involving gripping and manual labor.  She has varicose veins and spider veins, which are unsightly but not painful. She has hypothyroidism with a previously elevated TSH level of 40, associated with severe symptoms. She has a history of breast cancer and is due for a mammogram and a bone density scan. She is overdue for a tetanus shot and flu vaccination. She  abstains from alcohol due to its effects on her IC. No fever, headache, or rash.  See problem oriented charting- ROS- full  review of systems was completed and negative except for what is noted in HPI above.  The following were reviewed and entered/updated in epic: Past Medical History:  Diagnosis Date   ADHD (attention deficit hyperactivity disorder) 07/24/2019   Allergy    Anemia 2024   Anxiety    Cataract    Chronic diastolic CHF (congestive heart failure) (HCC)    cardriologist--- dr lelon. barbaraann;   dx in setting sepsis 11/ 2020;   echo in epic 03-10-2019 ef 55-60%, G1DD, mild to moderate TR no stenosis and normal RASP;  CTA 01-31-2021 epic   Chronic kidney disease (CKD), stage III (moderate) (HCC)    nephrologist--- dr val   Depression    Difficulty urinating 01/25/2023   Diverticulosis of colon    Emphysema of lung (HCC)    no home oxygen   GERD (gastroesophageal reflux disease)    History of acute pyelonephritis    w/ hx sepsis   History of Helicobacter pylori infection 2012   History of recurrent UTIs    History of TIA (transient ischemic attack)    TIAs   Hyperlipidemia, mixed    Hypertension    followed by pcp and nephrologist----  (nuclear stress test in epic 06-28-2015 low risk no ischemia, nuclear ef 60%) no meds   Hypothyroidism    IBS (irritable bowel syndrome)    IC (interstitial cystitis)    remote hx   Inguinal hernia, bilateral    Irritable bowel syndrome  with constipation and diarrhea    LBBB (left bundle branch block) 2015   cardiologist --- dr barbaraann;  known chronic LBBB for several yrs   Medication management 06/12/2014   OCD (obsessive compulsive disorder)    OSA (obstructive sleep apnea)    no CPAP intolerant   Osteopenia    Prolapsed hemorrhoids    Seasonal allergies    Sleep apnea    Vitamin D  deficiency    Patient Active Problem List   Diagnosis Date Noted   Recurrent UTI 11/29/2023   Overactive bladder 11/29/2023   Dysuria  10/04/2023   Iron deficiency anemia 07/27/2023   Diverticular disease 07/01/2023   Decreased anal sphincter tone 01/25/2023   Prolapsed internal hemorrhoids, grade 3 01/25/2023   Coronary atherosclerosis of native coronary artery (per CTA 01/31/2021)  06/14/2021   Inguinal hernia, left 05/05/2021   Interstitial cystitis 06/14/2020   Overweight (BMI 25.0-29.9) 06/13/2020   Former smoker (40 pack year, quit 2004) 08/30/2019   ADHD (attention deficit hyperactivity disorder) 07/24/2019   Depression, controlled 07/24/2019   Atherosclerosis of aorta (HCC) by Chest CT scan in 2020  03/22/2019   Emphysema lung (HCC) - per CT 03/09/2019    Chronic diastolic CHF (congestive heart failure) (HCC) 03/11/2019   Mechanical complication of breast prosthesis, subsequent encounter 03/01/2019   Osteopenia 01/16/2019   History of colon polyps 06/28/2018   Closed bimalleolar fracture 08/25/2017   Fracture of ankle 08/23/2017   CKD Stage 3 (HCC) 01/10/2015   IBS  01/10/2015   LBBB (left bundle branch block) 07/24/2013   OCD (obsessive compulsive disorder)    Hypothyroidism 04/05/2013   Past Surgical History:  Procedure Laterality Date   ABDOMINAL HYSTERECTOMY  2000   ANTERIOR AND POSTERIOR REPAIR WITH SACROSPINOUS FIXATION N/A 11/10/2021   Procedure: POSTERIOR REPAIR WITH SACROSPINOUS FIXATION;  Surgeon: Marilynne Rosaline SAILOR, MD;  Location: [facility];  Service: Gynecology;  Laterality: N/A;  total time requested is 1.5 hrs   BLADDER SUSPENSION N/A 11/10/2021   Procedure: TRANSVAGINAL TAPE (TVT) PROCEDURE;  Surgeon: Marilynne Rosaline SAILOR, MD;  Location: [facility];  Service: Gynecology;  Laterality: N/A;   BREAST ENHANCEMENT SURGERY Bilateral    1970s   BREAST IMPLANT REMOVAL Bilateral 03/07/2019   BREAST SURGERY  2020   Removal breast implants   BUNIONECTOMY Bilateral 1997   CATARACT EXTRACTION W/ INTRAOCULAR LENS IMPLANT Right 2005   COLONOSCOPY  10/2020    COSMETIC SURGERY  1976   Breast implants/now removed   CYSTOSCOPY WITH FULGERATION  11/10/2021   Procedure: CYSTOSCOPY WITH FULGERATION;  Surgeon: Marilynne Rosaline SAILOR, MD;  Location: [facility];  Service: Gynecology;;   ESOPHAGOGASTRODUODENOSCOPY (EGD) WITH PROPOFOL  N/A 03/12/2019   Procedure: ESOPHAGOGASTRODUODENOSCOPY (EGD) WITH PROPOFOL ;  Surgeon: Rollin Dover, MD;  Location: [facility];  Service: Endoscopy;  Laterality: N/A;   EYE SURGERY     Cataract r eye   FRACTURE SURGERY  2019   Leg ankle foot left   HEMORRHOID SURGERY  1976   HERNIA REPAIR  2020   R and L inguinal   INGUINAL HERNIA REPAIR N/A 04/10/2021   Procedure: LAPAROSCOPIC LEFT INGUINAL HERNIA REPAIR AND RIGHT INGUINAL HERNII REPAIR WITH TAP BLOCK;  Surgeon: Sheldon Standing, MD;  Location: [facility];  Service: General;  Laterality: N/A;   ORIF ANKLE FRACTURE Left 08/30/2017   Procedure: OPEN REDUCTION INTERNAL FIXATION (ORIF) ANKLE FRACTURE;  Surgeon: Fidel Rogue, MD;  Location: [facility];  Service: Orthopedics;  Laterality: Left;  TONSILLECTOMY     CHILD   TUBAL LIGATION  1976    Family History  Problem Relation Age of Onset   Colon polyps Mother    Muscular dystrophy Mother        late 102s   Depression Mother    Early death Mother    Obesity Mother    Alcohol abuse Father    Depression Father    Early death Father    Hypertension Sister    Alcohol abuse Sister    Miscarriages / Stillbirths Sister    Muscular dystrophy Maternal Grandmother        50   Heart block Maternal Grandmother    Heart disease Maternal Grandmother    Stroke Maternal Grandmother    Diabetes Maternal Grandfather    ADD / ADHD Son    Cancer Son    Colon cancer Neg Hx    Esophageal cancer Neg Hx    Rectal cancer Neg Hx    Stomach cancer Neg Hx     Medications- reviewed and updated Current Outpatient Medications  Medication Sig Dispense Refill   acetaminophen  (TYLENOL ) 325 MG tablet Take 650  mg by mouth every 6 (six) hours as needed.     ALOE VERA PO Take by mouth.     aspirin  EC 81 MG tablet Take 81 mg by mouth daily.      COENZYME Q10 PO      cyclobenzaprine  (FLEXERIL ) 5 MG tablet Take 1 tablet (5 mg total) by mouth 3 (three) times daily as needed for muscle spasms. 60 tablet 1   D-Mannose 500 MG CAPS Take 1,000 mg by mouth daily.     diazepam  (VALIUM ) 5 MG tablet Place 1 tablet vaginally nightly as needed for muscle spasm/ pelvic pain. 30 tablet 1   Dihydroxyaluminum Sod Carb (ROLAIDS PO) Take by mouth daily as needed.     Estradiol  10 MCG TABS vaginal tablet Place vaginally.     gabapentin  (NEURONTIN ) 600 MG tablet Take 1/2 to 1 tablet 2 to 3 x /Daily as needed for Pain 270 tablet 1   guaifenesin  (HUMIBID E) 400 MG TABS tablet Take 400 mg by mouth daily.     hydrOXYzine  (ATARAX ) 25 MG tablet Take 25 mg by mouth 3 (three) times daily.     hyoscyamine  (LEVSIN  SL) 0.125 MG SL tablet DISSOLVE 1 TABLET UNDER THE TONGUE EVERY 4 HOURS IF NEEDED FOR NAUSEA, BLOATING, CRAMPING, OR DIARRHEA 120 tablet 1   ipratropium-albuterol  (DUONEB) 0.5-2.5 (3) MG/3ML SOLN USE 1 VIAL IN NEBULIZER EVERY 4 HOURS AS NEEDED **MAX 6 DOSES A DAY** 540 mL 0   levothyroxine  (SYNTHROID ) 150 MCG tablet TAKE 1 TABLET BY MOUTH DAILY BEFORE BREAKFAST 90 tablet 0   lidocaine -prilocaine  (EMLA ) cream Apply 1 Application topically as needed. 30 g 0   Loratadine (CLARITIN PO) Take by mouth.     Magnesium  250 MG CAPS Take by mouth.     ondansetron  (ZOFRAN ) 8 MG tablet TAKE 1 TABLET BY MOUTH 3 TIMES A DAY AS NEEDED FOR NAUSEA 60 tablet 2   OVER THE COUNTER MEDICATION L -arginine     OVER THE COUNTER MEDICATION 5,000 Units. VITAMIN D      phenazopyridine  (PYRIDIUM ) 95 MG tablet Take 95 mg by mouth 3 (three) times daily as needed for pain.     Probiotic Product (PROBIOTIC BLEND PO)      traZODone  (DESYREL ) 50 MG tablet Take 1 tablet (50 mg total) by mouth at bedtime. (Patient not taking: Reported on  02/09/2024) 90 tablet 1    No current facility-administered medications for this visit.    Allergies-reviewed and updated Allergies  Allergen Reactions   Ace Inhibitors Other (See Comments)    Acute renal failure (lisinopril )  Other Reaction(s): Other (See Comments)    Acute renal failure (lisinopril ) Acute renal failure (lisinopril )  Acute renal failure (lisinopril )    Acute renal failure (lisinopril )    Acute renal failure (lisinopril ) Acute renal failure (lisinopril )   Cephalexin Anaphylaxis   Cephalosporins Anaphylaxis   Codeine Nausea Only and Other (See Comments)    Other Reaction(s): GI Intolerance   Latex Rash, Dermatitis, Hives, Itching, Nausea And Vomiting and Swelling   Levaquin  [Levofloxacin ] Nausea And Vomiting   Bactrim  [Sulfamethoxazole -Trimethoprim ] Itching and Rash   Ciprofloxacin  Rash and Itching   Trospium  Palpitations    Social History   Social History Narrative   Not on file   Objective:  BP 124/82 (BP Location: Left Arm, Patient Position: Sitting, Cuff Size: Normal)   Pulse 74   Temp (!) 97.3 F (36.3 C) (Temporal)   Ht 5' 3 (1.6 m)   Wt 147 lb 8 oz (66.9 kg)   SpO2 93%   BMI 26.13 kg/m  Physical Exam Vitals and nursing note reviewed.  Constitutional:      Appearance: Normal appearance.  HENT:     Head: Normocephalic.     Right Ear: Tympanic membrane normal.     Left Ear: Tympanic membrane normal.     Nose: Nose normal.     Mouth/Throat:     Mouth: Mucous membranes are moist.  Eyes:     Pupils: Pupils are equal, round, and reactive to light.  Cardiovascular:     Rate and Rhythm: Normal rate and regular rhythm.  Pulmonary:     Effort: Pulmonary effort is normal.     Breath sounds: Normal breath sounds.  Musculoskeletal:        General: Normal range of motion.     Cervical back: Normal range of motion.  Lymphadenopathy:     Cervical: No cervical adenopathy.  Skin:    General: Skin is warm and dry.  Neurological:     Mental Status: She is alert.   Psychiatric:        Mood and Affect: Mood normal.        Behavior: Behavior normal.     Assessment and Plan   Health Maintenance counseling: 1. Anticipatory guidance: Patient counseled regarding regular dental exams q6 months, eye exams,  avoiding smoking and second hand smoke, limiting alcohol to 1 beverage per day, no illicit drugs.   2. Risk factor reduction:  Advised patient of need for regular exercise and diet rich with fruits and vegetables to reduce risk of heart attack and stroke.  Wt Readings from Last 3 Encounters:  02/10/24 147 lb 8 oz (66.9 kg)  01/18/24 142 lb 9.6 oz (64.7 kg)  11/29/23 151 lb (68.5 kg)   3. Immunizations/screenings/ancillary studies Immunization History  Administered Date(s) Administered   INFLUENZA, HIGH DOSE SEASONAL PF 03/22/2017, 02/16/2018, 03/22/2019, 03/24/2021, 02/17/2022, 03/18/2023   Influenza Split 01/31/2016, 05/12/2018, 03/17/2019   Influenza-Unspecified 01/31/2016, 05/12/2018, 03/17/2019, 02/09/2020   PFIZER Comirnaty(Keefe Top)Covid-19 Tri-Sucrose Vaccine 03/31/2021   PFIZER(Purple Top)SARS-COV-2 Vaccination 07/05/2019, 07/26/2019, 03/04/2020   Pneumococcal Conjugate-13 03/22/2017, 06/29/2018   Pneumococcal Polysaccharide-23 02/12/1999, 06/29/2018   Td 04/27/2013   Td (Adult),5 Lf Tetanus Toxid, Preservative Free 04/27/2013   Zoster Recombinant(Shingrix) 06/14/2020, 08/28/2020   Zoster, Live 11/24/2005   There are no preventive care  reminders to display for this patient.  4. Cervical cancer screening-  no longer needed 5. Breast cancer screening-  mammogram  6. Colon cancer screening -  7. Skin cancer screening- advised regular sunscreen use. Denies worrisome, changing, or new skin lesions.  8. Birth control/STD check- N/A  9. Osteoporosis screening- ordering DEXA scan today 10. Alcohol screening: none 11. Smoking associated screening (lung cancer screening, AAA screen 65-75, UA)- non- smoker 12. Exercise-  gardening    Assessment & Plan Adult Wellness Visit Routine wellness visit with well-controlled blood pressure. She declined COVID vaccinations but agrees to get flu and TDAP vaccinations at her pharmacy. - Order full CPE lab panel. - Order mammogram. - Order DEXA scan for bone density. - Follow up with pharmacy for flu and TdaP vaccines.  Interstitial cystitis (chronic) without hematuria Chronic interstitial cystitis with severe pain and frequent urination. Pyridium  provides relief. Previous treatments were ineffective or had adverse effects. Considered naltrexone for pain management, but concerned about delayed onset. Recent severe pain required oxycodone . - Continue Pyridium  with regular lab monitoring. - Consider naltrexone for pain management with urogynecologist. - Follow up with [facility] pain clinic for gynecologic reasons.  Peripheral neuropathy Sx are in line with carpal tunnel syndrome with tingling and numbness in fingers, worsened by manual labor or upon awakening in am. Trigger finger requires manual manipulation. Wrist braces can provide relief. She prefers to avoid surgery. - Recommend use of wrist braces with hard under wrist plate while sleeping for 1-2 weeks at time and during day as able. - Avoid activities that exacerbate symptoms, avoid gripping tools too tightly.  Recommended follow up:  Return for any future concerns, Complete physical w/fasting labs. Future Appointments  Date Time Provider Department Center  02/17/2024 12:40 PM Zuleta, Kaitlin G, NP [facility]  02/22/2024 12:40 PM Zuleta, Kaitlin G, NP [facility]  02/29/2024 12:40 PM Zuleta, Kaitlin G, NP [facility]  03/07/2024 12:40 PM Zuleta, Kaitlin G, NP [facility]   Lab/Order associations:  fasting  Lucius Krabbe, NP

## 2024-02-11 ENCOUNTER — Encounter: Payer: Self-pay | Admitting: Family

## 2024-02-11 DIAGNOSIS — G5603 Carpal tunnel syndrome, bilateral upper limbs: Secondary | ICD-10-CM | POA: Insufficient documentation

## 2024-02-11 DIAGNOSIS — Z780 Asymptomatic menopausal state: Secondary | ICD-10-CM | POA: Insufficient documentation

## 2024-02-14 ENCOUNTER — Encounter: Payer: Self-pay | Admitting: Family

## 2024-02-14 ENCOUNTER — Ambulatory Visit: Payer: Self-pay | Admitting: Family

## 2024-02-14 ENCOUNTER — Other Ambulatory Visit: Payer: Self-pay | Admitting: Obstetrics and Gynecology

## 2024-02-15 NOTE — Telephone Encounter (Signed)
 Let her know chart has been corrected, thx.

## 2024-02-17 ENCOUNTER — Encounter: Payer: Self-pay | Admitting: Obstetrics and Gynecology

## 2024-02-17 ENCOUNTER — Ambulatory Visit (INDEPENDENT_AMBULATORY_CARE_PROVIDER_SITE_OTHER): Admitting: Obstetrics and Gynecology

## 2024-02-17 VITALS — BP 105/71 | HR 75

## 2024-02-17 DIAGNOSIS — N3281 Overactive bladder: Secondary | ICD-10-CM | POA: Diagnosis not present

## 2024-02-17 DIAGNOSIS — K580 Irritable bowel syndrome with diarrhea: Secondary | ICD-10-CM

## 2024-02-17 DIAGNOSIS — R350 Frequency of micturition: Secondary | ICD-10-CM

## 2024-02-17 DIAGNOSIS — N3010 Interstitial cystitis (chronic) without hematuria: Secondary | ICD-10-CM

## 2024-02-17 MED ORDER — HYOSCYAMINE SULFATE 0.125 MG SL SUBL: SUBLINGUAL_TABLET | SUBLINGUAL | 1 refills | Status: DC

## 2024-02-17 MED ORDER — HYDROXYZINE HCL 25 MG PO TABS: 25.0000 mg | ORAL_TABLET | Freq: Three times a day (TID) | ORAL | 5 refills | Status: AC

## 2024-02-17 NOTE — Progress Notes (Signed)
 [facility]  PTNS VISIT  CC:  Overactive bladder  73 y.o. with refractory overactive bladder who presents for percutaneous tibial nerve stimulation. The patient presents for PTNS session # 9.   Procedure: The patient was placed in the sitting position and the right lower extremity was prepped in the usual fashion. The PTNS needle was then inserted at a 60 degree angle, 5 cm cephalad and 2 cm posterior to the medial malleolus. The PTNS unit was then programmed and an optimal response was noted at 9 milliamps. The PTNS stimulation was then performed at this setting for 30 minutes without incident and the patient tolerated the procedure well. The needle was removed and hemostasis was noted.   The pt will return in 1 week for PTNS session # 10. All questions were answered.

## 2024-02-22 ENCOUNTER — Ambulatory Visit: Admitting: Obstetrics and Gynecology

## 2024-02-22 ENCOUNTER — Encounter: Payer: Self-pay | Admitting: Obstetrics and Gynecology

## 2024-02-22 VITALS — BP 101/69 | HR 88

## 2024-02-22 DIAGNOSIS — N3281 Overactive bladder: Secondary | ICD-10-CM | POA: Diagnosis not present

## 2024-02-22 DIAGNOSIS — N3010 Interstitial cystitis (chronic) without hematuria: Secondary | ICD-10-CM

## 2024-02-22 NOTE — Progress Notes (Signed)
 [facility]  PTNS VISIT  CC:  Overactive bladder  73 y.o. with refractory overactive bladder who presents for percutaneous tibial nerve stimulation. The patient presents for PTNS session # 10.   Procedure: The patient was placed in the sitting position and the right lower extremity was prepped in the usual fashion. The PTNS needle was then inserted at a 60 degree angle, 5 cm cephalad and 2 cm posterior to the medial malleolus. The PTNS unit was then programmed and an optimal response was noted at 9 milliamps. The PTNS stimulation was then performed at this setting for 30 minutes without incident and the patient tolerated the procedure well. The needle was removed and hemostasis was noted.    Patient reports she would like to have a urine checked to rule out Ureaplasma. Will send a urine sample for pathnostics.     The pt will return in 1 week for PTNS session # 11. All questions were answered.

## 2024-02-25 ENCOUNTER — Encounter: Payer: Self-pay | Admitting: Obstetrics and Gynecology

## 2024-02-29 ENCOUNTER — Ambulatory Visit (INDEPENDENT_AMBULATORY_CARE_PROVIDER_SITE_OTHER): Admitting: Obstetrics and Gynecology

## 2024-02-29 ENCOUNTER — Encounter: Payer: Self-pay | Admitting: Obstetrics and Gynecology

## 2024-02-29 VITALS — BP 111/69 | HR 70

## 2024-02-29 DIAGNOSIS — N3281 Overactive bladder: Secondary | ICD-10-CM | POA: Diagnosis not present

## 2024-02-29 DIAGNOSIS — N3010 Interstitial cystitis (chronic) without hematuria: Secondary | ICD-10-CM

## 2024-02-29 NOTE — Progress Notes (Signed)
 [facility]  PTNS VISIT  CC:  Overactive bladder  73 y.o. with refractory overactive bladder who presents for percutaneous tibial nerve stimulation. The patient presents for PTNS session # 11.  Pain and frequency have recently gotten worse again. She reports today is not a good day.   Procedure: The patient was placed in the sitting position and the right lower extremity was prepped in the usual fashion. The PTNS needle was then inserted at a 60 degree angle, 5 cm cephalad and 2 cm posterior to the medial malleolus. The PTNS unit was then programmed and an optimal response was noted at 10 milliamps. The PTNS stimulation was then performed at this setting for 30 minutes without incident and the patient tolerated the procedure well. The needle was removed and hemostasis was noted.    The pt will return in 1 week for PTNS session # 12. All questions were answered.

## 2024-03-02 ENCOUNTER — Other Ambulatory Visit: Payer: Self-pay | Admitting: Obstetrics and Gynecology

## 2024-03-02 DIAGNOSIS — N390 Urinary tract infection, site not specified: Secondary | ICD-10-CM

## 2024-03-02 NOTE — Progress Notes (Signed)
 Erroneous encounter

## 2024-03-03 ENCOUNTER — Ambulatory Visit (INDEPENDENT_AMBULATORY_CARE_PROVIDER_SITE_OTHER): Admitting: Obstetrics and Gynecology

## 2024-03-03 ENCOUNTER — Encounter: Payer: Self-pay | Admitting: Obstetrics and Gynecology

## 2024-03-03 VITALS — BP 129/79 | HR 68

## 2024-03-03 DIAGNOSIS — R102 Pelvic and perineal pain unspecified side: Secondary | ICD-10-CM

## 2024-03-03 MED ORDER — LIDOCAINE HCL URETHRAL/MUCOSAL 2 % EX GEL: 1.0000 | Freq: Once | CUTANEOUS | Status: AC

## 2024-03-03 NOTE — Progress Notes (Signed)
 [facility] Return Visit  SUBJECTIVE  History of Present Illness: [name] is a 73 y.o. female seen in follow-up for IC/ bladder pain, OAB with incontinence Treatment summary:   Surgery: s/p Posterior repair, sacrospinous ligament fixation, midurethral sling, cystoscopy on 11/10/21 - intravesical botox  in the office on 08/13/22- no significant improvement.  - robotic rectopexy with Dr Lang at [facility] on 08/09/23 along with cystoscopy with hydrodistention with Dr Darol. Tried gentamicin  instillations x3. No improvement with bladder symptoms.  - DMSO instillations- no improvement - Stopped the amitriptyline  because she did not like the way she felt.  Also stopped her antidepressant. She started trazodone . She is still taking the hyosciamine and atarax .  She tried the vaginal valium  but could not get it to dissolve much and took it orally.  - Has tried many natural supplements but has stopped: probiotics, aloe vera, marshmallow root, L-arginine, turmeric - For OAB- failed trospium , gemtesa , myrbetriq  - Last visit with pelvic PT was Dec 2024.   She was pulling up a tree root yesterday and she felt something tearing in the vagina. She is having a lot of pain. She took a bath, 3 advil and 650 tylenol . She also took CBD. She did not try vaginal valium  but felt it was not helping previously. Denies any UTI symptoms.   She has one more visit of PTNS.   Past Medical History: Patient  has a past medical history of ADHD (attention deficit hyperactivity disorder) (07/24/2019), Allergy, Anemia (2024), Anxiety, Cataract, Chronic diastolic CHF (congestive heart failure) (HCC), Chronic kidney disease (CKD), stage III (moderate) (HCC), Depression, Difficulty urinating (01/25/2023), Diverticulosis of colon, Dysuria (10/04/2023), Emphysema of lung (HCC), GERD (gastroesophageal reflux disease), History of acute pyelonephritis, History of Helicobacter pylori infection (2012), History of  recurrent UTIs, History of TIA (transient ischemic attack), Hyperlipidemia, mixed, Hypertension, Hypothyroidism, IBS (irritable bowel syndrome), IC (interstitial cystitis), Inguinal hernia, bilateral, Irritable bowel syndrome with constipation and diarrhea, LBBB (left bundle branch block) (2015), Medication management (06/12/2014), OCD (obsessive compulsive disorder), OSA (obstructive sleep apnea), Osteopenia, Prolapsed hemorrhoids, Seasonal allergies, Sleep apnea, and Vitamin D  deficiency.   Past Surgical History: She  has a past surgical history that includes Tonsillectomy; Bunionectomy (Bilateral, 1997); Hemorrhoid surgery (1976); Breast enhancement surgery (Bilateral); ORIF ankle fracture (Left, 08/30/2017); Breast implant removal (Bilateral, 03/07/2019); Esophagogastroduodenoscopy (egd) with propofol  (N/A, 03/12/2019); Colonoscopy (10/2020); Cataract extraction w/ intraocular lens implant (Right, 2005); Inguinal hernia repair (N/A, 04/10/2021); Anterior and posterior repair with sacrospinous fixation (N/A, 11/10/2021); Bladder suspension (N/A, 11/10/2021); Cystoscopy with fulgeration (11/10/2021); Hernia repair (2020); Tubal ligation (1976); Abdominal hysterectomy (2000); Fracture surgery (2019); Eye surgery; Cosmetic surgery (1976); and Breast surgery (2020).   Medications: She has a current medication list which includes the following prescription(s): acetaminophen , aloe vera, aspirin  ec, coenzyme q10, cyclobenzaprine , d-mannose, diazepam , dihydroxyaluminum sod carb, estradiol , gabapentin , guaifenesin , hydroxyzine , hyoscyamine , ipratropium-albuterol , levothyroxine , lidocaine -prilocaine , loratadine, magnesium , ondansetron , OVER THE COUNTER MEDICATION, OVER THE COUNTER MEDICATION, phenazopyridine , and probiotic product.   Allergies: Patient is allergic to ace inhibitors, cephalexin, cephalosporins, codeine, latex, levaquin  [levofloxacin ], bactrim  [sulfamethoxazole -trimethoprim ], ciprofloxacin , and  trospium .   Social History: Patient  reports that she quit smoking about 21 years ago. Her smoking use included cigarettes. She started smoking about 61 years ago. She has a 40 pack-year smoking history. She has been exposed to tobacco smoke. She has never used smokeless tobacco. She reports that she does not currently use alcohol. She reports that she does not use drugs.      OBJECTIVE  Physical Exam: Vitals:   03/03/24 0950  BP: 129/79  Pulse: 68   Gen: No apparent distress, A&O x 3.  Detailed Urogynecologic Evaluation:  Patient requested topical lidocaine  placement prior to exam. Normal external genitalia. On speculum, normal vaginal mucosa. On bimanual, no masses present. Slight anterior vaginal wall prolapse but does not extend to hymen. Slight tenderness on right obturator internus.     ASSESSMENT AND PLAN    [name] is a 73 y.o. with:  1. Pelvic pain    - Advised to avoid heavy lifting or straining - Try a vaginal valium  to help with any muscle tension.  - She has follow up with [facility] for pain next month  Will follow up for PTNS  Lauren LOISE Caper, MD  Time spent: I spent 25 minutes dedicated to the care of this patient on the date of this encounter to include pre-visit review of records, face-to-face time with the patient and post visit documentation and ordering medication/ testing.

## 2024-03-07 ENCOUNTER — Ambulatory Visit: Admitting: Obstetrics and Gynecology

## 2024-03-07 ENCOUNTER — Encounter: Payer: Self-pay | Admitting: Obstetrics and Gynecology

## 2024-03-07 VITALS — BP 112/74 | HR 83

## 2024-03-07 DIAGNOSIS — N3010 Interstitial cystitis (chronic) without hematuria: Secondary | ICD-10-CM

## 2024-03-07 DIAGNOSIS — R102 Pelvic and perineal pain unspecified side: Secondary | ICD-10-CM

## 2024-03-07 DIAGNOSIS — N3281 Overactive bladder: Secondary | ICD-10-CM

## 2024-03-07 DIAGNOSIS — N390 Urinary tract infection, site not specified: Secondary | ICD-10-CM

## 2024-03-07 NOTE — Progress Notes (Signed)
 [facility]  PTNS VISIT  CC:  Overactive bladder  73 y.o. with refractory overactive bladder who presents for percutaneous tibial nerve stimulation. The patient presents for PTNS session # 12.  Patient reports there has been some improvement in her OAB but she has good days and bad days and recently.   Procedure: The patient was placed in the sitting position and the right lower extremity was prepped in the usual fashion. The PTNS needle was then inserted at a 60 degree angle, 5 cm cephalad and 2 cm posterior to the medial malleolus. The PTNS unit was then programmed and an optimal response was noted at 6 milliamps. The PTNS stimulation was then performed at this setting for 30 minutes without incident and the patient tolerated the procedure well. The needle was removed and hemostasis was noted.   Patient is planning to follow up with [facility] for management of her pain and hormonal support. Her goal she reports is to become better managed and then follow back up with us .   Patient is open to trying PT again with a different therapist. She would like to see if Lupe Offer at [facility] would be a good fit for her. Will send this referral.

## 2024-03-08 ENCOUNTER — Encounter: Payer: Self-pay | Admitting: Obstetrics and Gynecology

## 2024-03-19 ENCOUNTER — Encounter: Payer: Self-pay | Admitting: Obstetrics and Gynecology

## 2024-03-20 ENCOUNTER — Other Ambulatory Visit: Payer: Self-pay | Admitting: Obstetrics and Gynecology

## 2024-03-20 MED ORDER — CYCLOBENZAPRINE HCL 5 MG PO TABS: 5.0000 mg | ORAL_TABLET | Freq: Three times a day (TID) | ORAL | 1 refills | Status: AC | PRN

## 2024-03-22 ENCOUNTER — Other Ambulatory Visit: Payer: Self-pay

## 2024-03-22 DIAGNOSIS — N3010 Interstitial cystitis (chronic) without hematuria: Secondary | ICD-10-CM

## 2024-03-22 LAB — PATHNOSTICS MOLECULAR TEST

## 2024-03-31 DIAGNOSIS — N958 Other specified menopausal and perimenopausal disorders: Secondary | ICD-10-CM | POA: Diagnosis not present

## 2024-03-31 DIAGNOSIS — N94819 Vulvodynia, unspecified: Secondary | ICD-10-CM | POA: Diagnosis not present

## 2024-03-31 DIAGNOSIS — M7918 Myalgia, other site: Secondary | ICD-10-CM | POA: Diagnosis not present

## 2024-03-31 DIAGNOSIS — G8929 Other chronic pain: Secondary | ICD-10-CM | POA: Diagnosis not present

## 2024-04-01 ENCOUNTER — Other Ambulatory Visit: Payer: Self-pay | Admitting: Medical Genetics

## 2024-04-06 ENCOUNTER — Other Ambulatory Visit

## 2024-04-06 DIAGNOSIS — Z006 Encounter for examination for normal comparison and control in clinical research program: Secondary | ICD-10-CM

## 2024-04-07 ENCOUNTER — Inpatient Hospital Stay (HOSPITAL_BASED_OUTPATIENT_CLINIC_OR_DEPARTMENT_OTHER)
Admission: RE | Admit: 2024-04-07 | Discharge: 2024-04-07 | Disposition: A | Source: Ambulatory Visit | Attending: Family | Admitting: Family

## 2024-04-07 ENCOUNTER — Encounter (HOSPITAL_BASED_OUTPATIENT_CLINIC_OR_DEPARTMENT_OTHER): Payer: Self-pay | Admitting: Radiology

## 2024-04-07 DIAGNOSIS — Z1231 Encounter for screening mammogram for malignant neoplasm of breast: Secondary | ICD-10-CM

## 2024-04-15 ENCOUNTER — Other Ambulatory Visit (HOSPITAL_BASED_OUTPATIENT_CLINIC_OR_DEPARTMENT_OTHER): Payer: Self-pay | Admitting: Family Medicine

## 2024-04-16 LAB — GENECONNECT MOLECULAR SCREEN

## 2024-04-19 ENCOUNTER — Other Ambulatory Visit (HOSPITAL_BASED_OUTPATIENT_CLINIC_OR_DEPARTMENT_OTHER): Payer: Self-pay | Admitting: Family

## 2024-04-28 ENCOUNTER — Ambulatory Visit (HOSPITAL_BASED_OUTPATIENT_CLINIC_OR_DEPARTMENT_OTHER)
Admission: RE | Admit: 2024-04-28 | Discharge: 2024-04-28 | Disposition: A | Discharge status: Home / Self Care | Source: Ambulatory Visit | Attending: Family | Admitting: Family

## 2024-04-28 ENCOUNTER — Other Ambulatory Visit (HOSPITAL_BASED_OUTPATIENT_CLINIC_OR_DEPARTMENT_OTHER): Payer: Self-pay

## 2024-04-28 DIAGNOSIS — Z780 Asymptomatic menopausal state: Secondary | ICD-10-CM | POA: Diagnosis present

## 2024-04-28 MED ORDER — ADACEL 5-2-15.5 LF-MCG/0.5 IM SUSY
PREFILLED_SYRINGE | INTRAMUSCULAR | 0 refills | Status: AC
  Filled 2024-04-28: qty 0.5, 1d supply, fill #0

## 2024-05-01 ENCOUNTER — Ambulatory Visit: Attending: Obstetrics and Gynecology | Admitting: Physical Therapy

## 2024-05-01 DIAGNOSIS — M6281 Muscle weakness (generalized): Secondary | ICD-10-CM | POA: Insufficient documentation

## 2024-05-01 DIAGNOSIS — M4125 Other idiopathic scoliosis, thoracolumbar region: Secondary | ICD-10-CM | POA: Insufficient documentation

## 2024-05-01 DIAGNOSIS — R2689 Other abnormalities of gait and mobility: Secondary | ICD-10-CM | POA: Insufficient documentation

## 2024-05-01 DIAGNOSIS — M533 Sacrococcygeal disorders, not elsewhere classified: Secondary | ICD-10-CM | POA: Insufficient documentation

## 2024-05-01 DIAGNOSIS — R279 Unspecified lack of coordination: Secondary | ICD-10-CM | POA: Insufficient documentation

## 2024-05-01 DIAGNOSIS — R102 Pelvic and perineal pain unspecified side: Secondary | ICD-10-CM | POA: Insufficient documentation

## 2024-05-01 NOTE — Patient Instructions (Signed)
 Avoid straining pelvic floor, abdominal muscles , spine  Use log rolling technique instead of getting out of bed with your neck or the sit-up  Log rolling into and out of bed Log rolling into and out of bed If getting out of bed on R side, Bent knees, scoot hips/ shoulder to L  Raise R arm completely overhead, rolling onto armpit  Then lower bent knees to bed to get into complete side lying position  Then drop legs off bed, and push up onto R elbow/forearm, and use L hand to push onto the bed __ Proper body mechanics with getting out of a chair to decrease strain  on back &pelvic floor   Avoid holding your breath when Getting out of the chair:  Scoot to front part of chair chair Heels behind knees, feet are hip width apart, nose over toes  Inhale like you are smelling roses Exhale to stand  ___  Sitting with feet on ground, four points of contact Catch yourself crossing ankles and thighs   __   ___ Lengthen Back rib by R  shoulder   ( winging)  Lie on L  side , pillow between knees and under head  Pull  R arm overhead over mattress, grab the edge of mattress,pull it upward, drawing elbow away from ears  Breathing 10 reps Brushing arm with 3/4 turn onto pillow behind back  Lying on L  side ,Pillow/ Block between knees  dragging top forearm across ribs below breast rotating 3/4 turn,  rotating  _R_ only this week ,  relax onto the pillow behind the back  and then back to other palm , maintain top palm on body whole top and not lift shoulder Do this side this week       Wait do both sides until we have levelled out your spine

## 2024-05-02 ENCOUNTER — Encounter: Payer: Self-pay | Admitting: Physical Therapy

## 2024-05-02 NOTE — Therapy (Signed)
 "  OUTPATIENT PHYSICAL THERAPY EVALUATION   Patient Name: [name] MRN: [id number] DOB:1950-08-29, 74 y.o., female Today's Date: 05/02/2024   PT End of Session - 05/01/24 0826     Visit Number 1    Number of Visits 10    Date for Recertification  07/10/24    PT Start Time 0809    PT Stop Time 0850    PT Time Calculation (min) 41 min    Activity Tolerance Patient tolerated treatment well;No increased pain    Behavior During Therapy [facility] for tasks assessed/performed          Past Medical History:  Diagnosis Date   ADHD (attention deficit hyperactivity disorder) 07/24/2019   Allergy    Anemia 2024   Anxiety    Cataract    Chronic diastolic CHF (congestive heart failure) (HCC)    cardriologist--- dr lelon. barbaraann;   dx in setting sepsis 11/ 2020;   echo in epic 03-10-2019 ef 55-60%, G1DD, mild to moderate TR no stenosis and normal RASP;  CTA 01-31-2021 epic   Chronic kidney disease (CKD), stage III (moderate) (HCC)    nephrologist--- dr val   Depression    Difficulty urinating 01/25/2023   Diverticulosis of colon    Dysuria 10/04/2023   Emphysema of lung (HCC)    no home oxygen   GERD (gastroesophageal reflux disease)    History of acute pyelonephritis    w/ hx sepsis   History of Helicobacter pylori infection 2012   History of recurrent UTIs    History of TIA (transient ischemic attack)    TIAs   Hyperlipidemia, mixed    Hypertension    followed by pcp and nephrologist----  (nuclear stress test in epic 06-28-2015 low risk no ischemia, nuclear ef 60%) no meds   Hypothyroidism    IBS (irritable bowel syndrome)    IC (interstitial cystitis)    remote hx   Inguinal hernia, bilateral    Irritable bowel syndrome with constipation and diarrhea    LBBB (left bundle branch block) 2015   cardiologist --- dr barbaraann;  known chronic LBBB for several yrs   Medication management 06/12/2014   OCD (obsessive compulsive disorder)    OSA (obstructive sleep apnea)    no CPAP  intolerant   Osteopenia    Prolapsed hemorrhoids    Seasonal allergies    Sleep apnea    Vitamin D  deficiency    Past Surgical History:  Procedure Laterality Date   ABDOMINAL HYSTERECTOMY  2000   ANTERIOR AND POSTERIOR REPAIR WITH SACROSPINOUS FIXATION N/A 11/10/2021   Procedure: POSTERIOR REPAIR WITH SACROSPINOUS FIXATION;  Surgeon: Marilynne Rosaline SAILOR, MD;  Location: [facility];  Service: Gynecology;  Laterality: N/A;  total time requested is 1.5 hrs   BLADDER SUSPENSION N/A 11/10/2021   Procedure: TRANSVAGINAL TAPE (TVT) PROCEDURE;  Surgeon: Marilynne Rosaline SAILOR, MD;  Location: [facility];  Service: Gynecology;  Laterality: N/A;   BREAST ENHANCEMENT SURGERY Bilateral    1970s   BREAST IMPLANT REMOVAL Bilateral 03/07/2019   BREAST SURGERY  2020   Removal breast implants   BUNIONECTOMY Bilateral 1997   CATARACT EXTRACTION W/ INTRAOCULAR LENS IMPLANT Right 2005   COLONOSCOPY  10/2020   COSMETIC SURGERY  1976   Breast implants/now removed   CYSTOSCOPY WITH FULGERATION  11/10/2021   Procedure: CYSTOSCOPY WITH FULGERATION;  Surgeon: Marilynne Rosaline SAILOR, MD;  Location: [facility];  Service: Gynecology;;   ESOPHAGOGASTRODUODENOSCOPY (EGD) WITH PROPOFOL  N/A 03/12/2019  Procedure: ESOPHAGOGASTRODUODENOSCOPY (EGD) WITH PROPOFOL ;  Surgeon: Rollin Dover, MD;  Location: [facility]COPY;  Service: Endoscopy;  Laterality: N/A;   EYE SURGERY     Cataract r eye   FRACTURE SURGERY  2019   Leg ankle foot left   HEMORRHOID SURGERY  1976   HERNIA REPAIR  2020   R and L inguinal   INGUINAL HERNIA REPAIR N/A 04/10/2021   Procedure: LAPAROSCOPIC LEFT INGUINAL HERNIA REPAIR AND RIGHT INGUINAL HERNII REPAIR WITH TAP BLOCK;  Surgeon: Sheldon Standing, MD;  Location: [facility];  Service: General;  Laterality: N/A;   ORIF ANKLE FRACTURE Left 08/30/2017   Procedure: OPEN REDUCTION INTERNAL FIXATION (ORIF) ANKLE FRACTURE;  Surgeon: Fidel Rogue, MD;  Location: [facility] OR;  Service: Orthopedics;  Laterality: Left;   TONSILLECTOMY     CHILD   TUBAL LIGATION  1976   Patient Active Problem List   Diagnosis Date Noted   Carpal tunnel syndrome on both sides 02/11/2024   Postmenopausal estrogen deficiency 02/11/2024   Recurrent UTI 11/29/2023   Overactive bladder 11/29/2023   Iron deficiency anemia 07/27/2023   Diverticular disease 07/01/2023   Decreased anal sphincter tone 01/25/2023   Prolapsed internal hemorrhoids, grade 3 01/25/2023   Coronary atherosclerosis of native coronary artery (per CTA 01/31/2021)  06/14/2021   Inguinal hernia, left 05/05/2021   Interstitial cystitis 06/14/2020   Overweight (BMI 25.0-29.9) 06/13/2020   Former smoker (40 pack year, quit 2004) 08/30/2019   Depression, controlled 07/24/2019   Atherosclerosis of aorta (HCC) by Chest CT scan in 2020  03/22/2019   Emphysema lung (HCC) - per CT 03/09/2019    Chronic diastolic CHF (congestive heart failure) (HCC) 03/11/2019   Mechanical complication of breast prosthesis, subsequent encounter 03/01/2019   Osteopenia 01/16/2019   History of colon polyps 06/28/2018   Closed bimalleolar fracture 08/25/2017   Fracture of ankle 08/23/2017   CKD Stage 3 (HCC) 01/10/2015   IBS  01/10/2015   LBBB (left bundle branch block) 07/24/2013   OCD (obsessive compulsive disorder)    Hypothyroidism 04/05/2013    PCP: Lucius Krabbe  REFERRING PROVIDER: Zuleta, Kailtin, NP  REFERRING DIAG: R10.20 (ICD-10-CM) - Pelvic pain   Rationale for Evaluation and Treatment Rehabilitation  THERAPY DIAG:  Other abnormalities of gait and mobility  Sacrococcygeal disorders, not elsewhere classified  Other idiopathic scoliosis, thoracolumbar region  ONSET DATE: after her botox  injections   SUBJECTIVE:  SUBJECTIVE STATEMENT TODAY:  See below  SUBJECTIVE STATEMENT ON EVAL 05/02/24 :  1) urinary leakage: Pt changes pads 6 x a day. Tried pelvic PT twice once at hospital setting and private setting along with PTNS with [facility] but these Tx did not help her urinary leakage issue.              One Pelvic PT who recommended using pelvic wand but pt did not enjoy using the wand and felt she was raw in her vaginal area.                 2) pelvic pain started about receiving botox  Tx for urinary frequency, urgency, and IC related Sx  . Pain is worst  when she has BMs and when she delays urination. Pain is relieved after bowel movements and urination. She notices a pressure sensation in her vaginal area but no bulging. Pressure sensation is worst with standing    3) constipation / diarrhea:  stool type 2 occurs 80% of the time, Type 4 across 20% of the time . Drinks 4 ( 16 fl oz) of water , Drinks coffe alternative Caffix , no soda  . Takes Immodium,  Fecal leakage resolved with hemorrhoid surgery    Interdisciplinary Notes:   ED visit 11/10/23 with ED note stating:  Patient with dysuria.  History of interstitial cystitis and potential urinary retention.  Also had recent urine culture that was positive.  Has had Klebsiella previously but now E. coli.Differential diagnosis includes interstitial cystitis and also UTI.  F/u with urogynologist Dr. Janina 11/17/23: who recommended trying Pelvic PT again and also discussed management of pelvic floor muscle spasm with trigger point injections.    PERTINENT HISTORY:  OSA  -no CPAP, intolerant LBBB CHF CAD IC IBS  Dysuria Recurrent UTI  L inguinal hernia  History of TIA (transient ischemic attack)  Hyperlipidemia, mixed Hypertension  Hypothyroidism  Osteopenia Closed bimalleolar Fx Fx of ankle   ADHD Anxiety Depression Diverticulosis of colon Prolapsed hermorrhoids,  Surgeries: Robotic Rectopexy 07/2023  Anterior and  posterior repair with sacrospinous fixation, bladder suspension, Hysterectomy Hemorrhoid surgery B Bunionectomy ORIF ankle L  Breast enhancement and later implants removed    PAIN:  Are you having pain? Yes: see above  PRECAUTIONS: osteopenia, Hx of TIA, BBBB  WEIGHT BEARING RESTRICTIONS:   FALLS:  Has patient fallen in last 6 months?   LIVING ENVIRONMENT: Lives with:  Lives in:  Stairs:  Has following equipment at home:   OCCUPATION: retired [employer]   PLOF: IND  PATIENT GOALS:   Improve leakage and pelvic pain   [facility] PT Assessment - 05/02/24 0821       Sit to Stand   Comments genu valgus, adducted knees      Posture/Postural Control   Posture Comments slumped posture, ankled crossed      Palpation   SI assessment  R shoulder/ iliac crest higher ( 1 finger width between iliacc rest and low rib), L side ( 2 fingers width),      Ambulation/Gait   Gait Comments 1.03 m/s, R pelvis sway, L scissoring , minimal trunk rotation, limited posterior rotation of L shoulder             Self-Care   Self-Care Other Self-Care Comments    Other Self-Care Comments  explained regional interdependent approach to address pain at different body parts, explained upcoming visits to realign posture and spine.pelvis to improve Sx and achieve goals, showed anatomy images of deep  core system ,      Therapeutic Activites    Therapeutic Activities Other Therapeutic Activities    Other Therapeutic Activities inquired about meaningful tasks and role of learning proper body mechanics, future sessions to help realign pelvis and spine and to learn co-activation of deep core system when performing against gravity tasks . These improvements will help address pain and Sx     Neuro Re-ed    Neuro Re-ed Details  cued for alignment and proprioception of pelvis and LKC and spine in  sit to stand and for proper sitting posture to activate deep core system          HOME EXERCISE PROGRAM: See pt  instruction section    ASSESSMENT:  CLINICAL IMPRESSION: Pt is a  74 yo  who presents with  following issues which impact QOL, ADL,  fitness, social and community activities:   1) urinary leakage             2) pelvic pain/ pressure 3) constipation / diarrhea  Pt's musculoskeletal assessment revealed uneven pelvic girdle and shoulder height, asymmetries to gait pattern, limited spinal /pelvic mobility, poor body mechanics which places strain on the abdominal/pelvic floor mm. These are deficits that indicate an ineffective intraabdominal pressure system associated with increased risk for pt's Sx.     Pt will benefit from propioception/ coordination/ body mechanics training and education with gravity-loaded tasks at work and home and  fitness modifications in order to gain a more effective intraabdominal pressure system to minimize Sx. Advised pt to not perform sit-ups and crunches as these movement patterns lead to more downward forces on the pelvic floor, negatively impacting abdominopelvic/spinal dysfunctions.   Pt was provided education on etiology of Sx with anatomy, physiology explanation with images along with the benefits of customized pelvic PT Tx based on pt's medical conditions and musculoskeletal deficits.  Explained the physiology of deep core mm coordination and roles of pelvic floor function/  lower kinetic chain ( LKC) , spinal/pelvic alignment for urination, defecation, sexual function, and postural control with deep core mm system, and the role of posture and alignment to help pelvic issues.    Following Tx today which pt tolerated without complaints,  pt demo'd proper body mechanics to minimize straining pelvic floor.  Plan to address realignment of spine/ pelvis at next session to help promote optimize intra-abdominal pressure system (IAP)  for improved pelvic floor function, trunk stability, gait, balance, stabilization with mobility tasks.  Plan to address pelvic floor issues  once pelvis and spine are realigned to yield better outcomes.    Regional interdependent approaches will yield greater benefits in pt's POC due to the complexity of pt's medical Hx and the significant impact their Sx have had on their QOL. Pt would benefit from a biopsychosocial approach to yield optimal outcomes. Plan to build interdisciplinary team with pt's providers to optimize patient-centered care as needed.                                                      Pt benefits from skilled PT to address: 1) urinary leakage             2) pelvic pain/ pressure 3) constipation / diarrhea   OBJECTIVE IMPAIRMENTS decreased activity tolerance, decreased coordination, decreased endurance, decreased mobility, difficulty walking, decreased ROM, decreased strength,  decreased safety awareness, hypomobility, increased muscle spasms, impaired flexibility, improper body mechanics, postural dysfunction, and pain. scar restrictions   ACTIVITY LIMITATIONS  self-care,  community activities,  home chores, work tasks    PARTICIPATION LIMITATIONS:  community, activities    PERSONAL FACTORS   affecting patient's functional outcome:    REHAB POTENTIAL: Good   CLINICAL DECISION MAKING: Evolving/moderate complexity   EVALUATION COMPLEXITY: Moderate    PATIENT EDUCATION:    Education details: Showed pt anatomy images. Explained muscles attachments/ connection, physiology of deep core system/ spinal- thoracic-pelvis-lower kinetic chain as they relate to pt's presentation, Sx, and past Hx. Explained what and how these areas of deficits need to be restored to balance and function    See Therapeutic activity / neuromuscular re-education section  Answered pt's questions.   Person educated: Patient Education method: Explanation, Demonstration, Tactile cues, Verbal cues, and Handouts Education comprehension: verbalized understanding, returned demonstration, verbal cues required, tactile cues required, and  needs further education     PLAN: PT FREQUENCY: 1x/week   PT DURATION: 10 weeks   PLANNED INTERVENTIONS:   Gait training;Stair training;Functional mobility training;DME Instruction;Therapeutic activities;Therapeutic exercise;Balance training;Neuromuscular re-education;Patient/family education;Vestibular;Visual/perceptual remediation/compensation;Passive range of motion;Moist Heat;Cryotherapy;Traction;Canalith Repostioning;Joint Manipulations;Manual lymph drainage;Manual techniques;Scar mobilization;Energy conservation;Dry needling;ADLs/Self Care Home Management;Biofeedback;Electrical Stimulation;Taping    PLAN FOR NEXT SESSION: See clinical impression for plan     GOALS: Goals reviewed with patient? Yes  SHORT TERM GOALS: Target date: 05/30/2024    Pt will demo IND with HEP                    Baseline: Not IND            Goal status: INITIAL   LONG TERM GOALS: Target date: 07/10/2024    1.Pt will demo proper deep core coordination without chest breathing and optimal excursion of diaphragm/pelvic floor in order to promote spinal stability and pelvic floor function  Baseline: dyscoordination Goal status: INITIAL  2.  Pt will demo proper body mechanics in against gravity tasks and ADLs  work tasks, fitness  to minimize straining pelvic floor / back    Baseline: not IND, improper form that places strain on pelvic floor  Goal status: INITIAL    3. Pt will demo increased gait speed > 1.3 m/s with reciprocal gait pattern, longer stride length  in order to ambulate safely in community and return to fitness routine  Baseline: 1.03 m/s, R pelvis sway, L scissoring , minimal trunk rotation, limited posterior rotation of L shoulder  Goal status: INITIAL    4. Pt will demo levelled pelvic girdle and shoulder height in order to progress to deep core strengthening HEP and restore mobility at spine, pelvis, gait, posture minimize falls, and improve balance  Baseline: R shoulder/ iliac  crest higher ( 1 finger width between iliacc rest and low rib), L side ( 2 fingers width),  Goal status: INITIAL   5. Pt will improve PFDI-7 questionnaire to  pts  score change  to demo improved QOL  Baseline:    ( greater pts indicate greater negative impact on QOL)    134 pts  ( total)   48 pts  ( UIQ-7 )   48 pts  ( CRAIQ-7 )   39  pts  ( POPIQ-7 )  Baseline:  Goal status: INITIAL  6.  Pt wil report decreased urinary pads changes to< 4 pads a day to improve QOL Baseline: changes pads 6 x a day Goal status: INITIAL  7.  Pt will report: Stool type 4   from _0% of the time to > 50% of the time   BMs occurring daily to every other day   Baseline:  stool type 2 occurs 80% of the time, Type 4 across 20% of the time .  BMs occur _ a week   Goal status: INITIAL  8. Pt will report no more pressure /pelvic pain with standing > 30 min in order to cook and shop Baseline: worst with standing  Goal status: INITIAL     Pia Lupe Plump, PT 05/02/2024, 8:28 AM   "

## 2024-05-08 ENCOUNTER — Encounter: Payer: Self-pay | Admitting: *Deleted

## 2024-05-11 ENCOUNTER — Ambulatory Visit: Admitting: Physical Therapy

## 2024-05-11 DIAGNOSIS — M4125 Other idiopathic scoliosis, thoracolumbar region: Secondary | ICD-10-CM

## 2024-05-11 DIAGNOSIS — R102 Pelvic and perineal pain unspecified side: Secondary | ICD-10-CM

## 2024-05-11 DIAGNOSIS — R279 Unspecified lack of coordination: Secondary | ICD-10-CM

## 2024-05-11 DIAGNOSIS — M533 Sacrococcygeal disorders, not elsewhere classified: Secondary | ICD-10-CM

## 2024-05-11 DIAGNOSIS — R2689 Other abnormalities of gait and mobility: Secondary | ICD-10-CM | POA: Diagnosis not present

## 2024-05-11 DIAGNOSIS — M6281 Muscle weakness (generalized): Secondary | ICD-10-CM

## 2024-05-11 NOTE — Patient Instructions (Signed)
  ZigZag stretch  Reclined twist for hips and side of the hips/ legs  Lay on your back, knees bend, dig elbows and feet into bed to exhale and lift buttocks up to  Scoot hips and feet  to the L , leave shoulders in place Wobble knees to the R side 45 deg and to midline  10 reps   Then reach for R thigh with R hand cross body, straighten knee and point toes toward face, bend knee and repeat straigthen 10 reps to lengthen hamstring and ITband (side of hip)   Repeat on other side: Scoot hip and feet  R ,  leave shoulders in place Wobble knees to the L side 45 deg and to midline  10 reps

## 2024-05-11 NOTE — Therapy (Signed)
 "  OUTPATIENT PHYSICAL THERAPY  TREATMENT    Patient Name: [name] MRN: [id number] DOB:January 13, 1951, 74 y.o., female Today's Date: 05/11/2024   PT End of Session - 05/11/24 0901     Visit Number 2    Number of Visits 10    Date for Recertification  07/10/24    PT Start Time 0850    PT Stop Time 0930    PT Time Calculation (min) 40 min    Activity Tolerance Patient tolerated treatment well;No increased pain    Behavior During Therapy [facility] for tasks assessed/performed          Past Medical History:  Diagnosis Date   ADHD (attention deficit hyperactivity disorder) 07/24/2019   Allergy    Anemia 2024   Anxiety    Cataract    Chronic diastolic CHF (congestive heart failure) (HCC)    cardriologist--- dr lelon. barbaraann;   dx in setting sepsis 11/ 2020;   echo in epic 03-10-2019 ef 55-60%, G1DD, mild to moderate TR no stenosis and normal RASP;  CTA 01-31-2021 epic   Chronic kidney disease (CKD), stage III (moderate) (HCC)    nephrologist--- dr val   Depression    Difficulty urinating 01/25/2023   Diverticulosis of colon    Dysuria 10/04/2023   Emphysema of lung (HCC)    no home oxygen   GERD (gastroesophageal reflux disease)    History of acute pyelonephritis    w/ hx sepsis   History of Helicobacter pylori infection 2012   History of recurrent UTIs    History of TIA (transient ischemic attack)    TIAs   Hyperlipidemia, mixed    Hypertension    followed by pcp and nephrologist----  (nuclear stress test in epic 06-28-2015 low risk no ischemia, nuclear ef 60%) no meds   Hypothyroidism    IBS (irritable bowel syndrome)    IC (interstitial cystitis)    remote hx   Inguinal hernia, bilateral    Irritable bowel syndrome with constipation and diarrhea    LBBB (left bundle branch block) 2015   cardiologist --- dr barbaraann;  known chronic LBBB for several yrs   Medication management 06/12/2014   OCD (obsessive compulsive disorder)    OSA (obstructive sleep apnea)    no  CPAP intolerant   Osteopenia    Prolapsed hemorrhoids    Seasonal allergies    Sleep apnea    Vitamin D  deficiency    Past Surgical History:  Procedure Laterality Date   ABDOMINAL HYSTERECTOMY  2000   ANTERIOR AND POSTERIOR REPAIR WITH SACROSPINOUS FIXATION N/A 11/10/2021   Procedure: POSTERIOR REPAIR WITH SACROSPINOUS FIXATION;  Surgeon: Marilynne Rosaline SAILOR, MD;  Location: [facility];  Service: Gynecology;  Laterality: N/A;  total time requested is 1.5 hrs   BLADDER SUSPENSION N/A 11/10/2021   Procedure: TRANSVAGINAL TAPE (TVT) PROCEDURE;  Surgeon: Marilynne Rosaline SAILOR, MD;  Location: [facility];  Service: Gynecology;  Laterality: N/A;   BREAST ENHANCEMENT SURGERY Bilateral    1970s   BREAST IMPLANT REMOVAL Bilateral 03/07/2019   BREAST SURGERY  2020   Removal breast implants   BUNIONECTOMY Bilateral 1997   CATARACT EXTRACTION W/ INTRAOCULAR LENS IMPLANT Right 2005   COLONOSCOPY  10/2020   COSMETIC SURGERY  1976   Breast implants/now removed   CYSTOSCOPY WITH FULGERATION  11/10/2021   Procedure: CYSTOSCOPY WITH FULGERATION;  Surgeon: Marilynne Rosaline SAILOR, MD;  Location: [facility];  Service: Gynecology;;   ESOPHAGOGASTRODUODENOSCOPY (EGD) WITH PROPOFOL   N/A 03/12/2019   Procedure: ESOPHAGOGASTRODUODENOSCOPY (EGD) WITH PROPOFOL ;  Surgeon: Rollin Dover, MD;  Location: [facility];  Service: Endoscopy;  Laterality: N/A;   EYE SURGERY     Cataract r eye   FRACTURE SURGERY  2019   Leg ankle foot left   HEMORRHOID SURGERY  1976   HERNIA REPAIR  2020   R and L inguinal   INGUINAL HERNIA REPAIR N/A 04/10/2021   Procedure: LAPAROSCOPIC LEFT INGUINAL HERNIA REPAIR AND RIGHT INGUINAL HERNII REPAIR WITH TAP BLOCK;  Surgeon: Sheldon Standing, MD;  Location: [facility];  Service: General;  Laterality: N/A;   ORIF ANKLE FRACTURE Left 08/30/2017   Procedure: OPEN REDUCTION INTERNAL FIXATION (ORIF) ANKLE FRACTURE;  Surgeon:  Fidel Rogue, MD;  Location: [facility] OR;  Service: Orthopedics;  Laterality: Left;   TONSILLECTOMY     CHILD   TUBAL LIGATION  1976   Patient Active Problem List   Diagnosis Date Noted   Carpal tunnel syndrome on both sides 02/11/2024   Postmenopausal estrogen deficiency 02/11/2024   Recurrent UTI 11/29/2023   Overactive bladder 11/29/2023   Iron deficiency anemia 07/27/2023   Diverticular disease 07/01/2023   Decreased anal sphincter tone 01/25/2023   Prolapsed internal hemorrhoids, grade 3 01/25/2023   Coronary atherosclerosis of native coronary artery (per CTA 01/31/2021)  06/14/2021   Inguinal hernia, left 05/05/2021   Interstitial cystitis 06/14/2020   Overweight (BMI 25.0-29.9) 06/13/2020   Former smoker (40 pack year, quit 2004) 08/30/2019   Depression, controlled 07/24/2019   Atherosclerosis of aorta (HCC) by Chest CT scan in 2020  03/22/2019   Emphysema lung (HCC) - per CT 03/09/2019    Chronic diastolic CHF (congestive heart failure) (HCC) 03/11/2019   Mechanical complication of breast prosthesis, subsequent encounter 03/01/2019   Osteopenia 01/16/2019   History of colon polyps 06/28/2018   Closed bimalleolar fracture 08/25/2017   Fracture of ankle 08/23/2017   CKD Stage 3 (HCC) 01/10/2015   IBS  01/10/2015   LBBB (left bundle branch block) 07/24/2013   OCD (obsessive compulsive disorder)    Hypothyroidism 04/05/2013    PCP: Lucius Krabbe  REFERRING PROVIDER: Zuleta, Kailtin, NP  REFERRING DIAG: R10.20 (ICD-10-CM) - Pelvic pain   Rationale for Evaluation and Treatment Rehabilitation  THERAPY DIAG:  Other abnormalities of gait and mobility  Sacrococcygeal disorders, not elsewhere classified  Other idiopathic scoliosis, thoracolumbar region  Muscle weakness (generalized)  Unspecified lack of coordination  Pelvic pain  ONSET DATE: after her botox  injections   SUBJECTIVE:  SUBJECTIVE STATEMENT TODAY:  Pt reported she did yard work and aftrerwards, she felt R shoulder pain. Pt has been doing her HEP and has no issues. She has a hard time remembering it  SUBJECTIVE STATEMENT ON EVAL 05/02/24 :  1) urinary leakage: Pt changes pads 6 x a day. Tried pelvic PT twice once at hospital setting and private setting along with PTNS with [facility] but these Tx did not help her urinary leakage issue.              One Pelvic PT who recommended using pelvic wand but pt did not enjoy using the wand and felt she was raw in her vaginal area.                 2) pelvic pain started about receiving botox  Tx for urinary frequency, urgency, and IC related Sx  . Pain is worst  when she has BMs and when she delays urination. Pain is relieved after bowel movements and urination. She notices a pressure sensation in her vaginal area but no bulging. Pressure sensation is worst with standing    3) constipation / diarrhea:  stool type 2 occurs 80% of the time, Type 4 across 20% of the time . Drinks 4 ( 16 fl oz) of water , Drinks coffe alternative Caffix , no soda  . Takes Immodium,  Fecal leakage resolved with hemorrhoid surgery    Interdisciplinary Notes:   ED visit 11/10/23 with ED note stating:  Patient with dysuria.  History of interstitial cystitis and potential urinary retention.  Also had recent urine culture that was positive.  Has had Klebsiella previously but now E. coli.Differential diagnosis includes interstitial cystitis and also UTI.  F/u with urogynologist Dr. Janina 11/17/23: who recommended trying Pelvic PT again and also discussed management of pelvic floor muscle spasm with trigger point injections.    PERTINENT HISTORY:  OSA  -no CPAP, intolerant LBBB CHF CAD IC IBS  Dysuria Recurrent UTI  L inguinal hernia  History of TIA (transient ischemic attack)   Hyperlipidemia, mixed Hypertension  Hypothyroidism  Osteopenia Closed bimalleolar Fx Fx of ankle   ADHD Anxiety Depression Diverticulosis of colon Prolapsed hermorrhoids,  Surgeries: Robotic Rectopexy 07/2023  Anterior and posterior repair with sacrospinous fixation, bladder suspension, Hysterectomy Hemorrhoid surgery B Bunionectomy ORIF ankle L  Breast enhancement and later implants removed    PAIN:  Are you having pain? Yes: see above  PRECAUTIONS: osteopenia, Hx of TIA, BBBB  WEIGHT BEARING RESTRICTIONS:   FALLS:  Has patient fallen in last 6 months?   LIVING ENVIRONMENT: Lives with:  Lives in:  Stairs:  Has following equipment at home:   OCCUPATION: retired [employer]   PLOF: IND  PATIENT GOALS:   Improve leakage and pelvic pain  OBJECTIVE:   [facility] PT Assessment - 05/11/24 1031       Other:   Other/ Comments difficulty with logrolling with head lift      Palpation   SI assessment  pelvis levelled, R shoulder lowered    Palpation comment deviated L T3, 7, tightness and tenderness along L interspinal, intercostals T10-12 ( post Tx: pelvic and shoulder aligned)          [facility] Adult PT Treatment/Exercise - 05/11/24 1037       Self-Care   Self-Care Other Self-Care Comments    Other Self-Care Comments  explained the role of logrolling and scooting with less downward forces upon pelvic area, neck strain,      Neuro Re-ed  Neuro Re-ed Details  provided propiocetive cues for cervical retraction, trunk rotation HEP to help with realignment of spine      Modalities   Modalities Moist Heat  (5 min) under R back , guided relaxation practice     Manual Therapy   Manual therapy comments STM/MWM at problem areas noted in assessment to promote realignment of spine, levelled shoulder alignment,  and optimal diaphragmatic excursion             HOME EXERCISE PROGRAM: See pt instruction section    ASSESSMENT:  CLINICAL IMPRESSION:   Pt showed  good carry o ver with realigned pelvis but R shoulder remained lowered which  manual Tx addressed> Post Tx, deviations to C/T, T/L junction  gained mobility and shoulders were levelled which will help with optimal diaphragmatic excursion for deep core coordination.  provided propioceptive cues for cervical retraction, trunk rotation HEP to help with realignment of spine .  Plan to reassess spine and progress to cervicoscapular stabilization HEP next session and then progress to deep core training at upcoming session  Following Tx today which pt tolerated without complaints,  pt demo'd proper body mechanics to minimize straining pelvic floor with logrolling technique.  Plan to address realignment of spine/ pelvis at next session to help promote optimize intra-abdominal pressure system (IAP)  for improved pelvic floor function, trunk stability, gait, balance, stabilization with mobility tasks.  Plan to address pelvic floor issues once pelvis and spine are realigned to yield better outcomes.    Regional interdependent approaches will yield greater benefits in pt's POC due to the complexity of pt's medical Hx and the significant impact their Sx have had on their QOL. Pt would benefit from a biopsychosocial approach to yield optimal outcomes. Plan to build interdisciplinary team with pt's providers to optimize patient-centered care as needed.                                                      Pt benefits from skilled PT to address: 1) urinary leakage             2) pelvic pain/ pressure 3) constipation / diarrhea   OBJECTIVE IMPAIRMENTS decreased activity tolerance, decreased coordination, decreased endurance, decreased mobility, difficulty walking, decreased ROM, decreased strength, decreased safety awareness, hypomobility, increased muscle spasms, impaired flexibility, improper body mechanics, postural dysfunction, and pain. scar restrictions   ACTIVITY LIMITATIONS  self-care,  community  activities,  home chores, work tasks    PARTICIPATION LIMITATIONS:  community, activities    PERSONAL FACTORS   affecting patient's functional outcome:    REHAB POTENTIAL: Good   CLINICAL DECISION MAKING: Evolving/moderate complexity   EVALUATION COMPLEXITY: Moderate    PATIENT EDUCATION:    Education details: Showed pt anatomy images. Explained muscles attachments/ connection, physiology of deep core system/ spinal- thoracic-pelvis-lower kinetic chain as they relate to pt's presentation, Sx, and past Hx. Explained what and how these areas of deficits need to be restored to balance and function    See Therapeutic activity / neuromuscular re-education section  Answered pt's questions.   Person educated: Patient Education method: Explanation, Demonstration, Tactile cues, Verbal cues, and Handouts Education comprehension: verbalized understanding, returned demonstration, verbal cues required, tactile cues required, and needs further education     PLAN: PT FREQUENCY: 1x/week   PT DURATION: 10 weeks  PLANNED INTERVENTIONS:   Gait training;Stair training;Functional mobility training;DME Instruction;Therapeutic activities;Therapeutic exercise;Balance training;Neuromuscular re-education;Patient/family education;Vestibular;Visual/perceptual remediation/compensation;Passive range of motion;Moist Heat;Cryotherapy;Traction;Canalith Repostioning;Joint Manipulations;Manual lymph drainage;Manual techniques;Scar mobilization;Energy conservation;Dry needling;ADLs/Self Care Home Management;Biofeedback;Electrical Stimulation;Taping    PLAN FOR NEXT SESSION: See clinical impression for plan     GOALS: Goals reviewed with patient? Yes  SHORT TERM GOALS: Target date: 05/30/2024    Pt will demo IND with HEP                    Baseline: Not IND            Goal status: INITIAL   LONG TERM GOALS: Target date: 07/10/2024    1.Pt will demo proper deep core coordination without chest  breathing and optimal excursion of diaphragm/pelvic floor in order to promote spinal stability and pelvic floor function  Baseline: dyscoordination Goal status: INITIAL  2.  Pt will demo proper body mechanics in against gravity tasks and ADLs  work tasks, fitness  to minimize straining pelvic floor / back    Baseline: not IND, improper form that places strain on pelvic floor  Goal status: INITIAL    3. Pt will demo increased gait speed > 1.3 m/s with reciprocal gait pattern, longer stride length  in order to ambulate safely in community and return to fitness routine  Baseline: 1.03 m/s, R pelvis sway, L scissoring , minimal trunk rotation, limited posterior rotation of L shoulder  Goal status: INITIAL    4. Pt will demo levelled pelvic girdle and shoulder height in order to progress to deep core strengthening HEP and restore mobility at spine, pelvis, gait, posture minimize falls, and improve balance  Baseline: R shoulder/ iliac crest higher ( 1 finger width between iliacc rest and low rib), L side ( 2 fingers width),  Goal status: INITIAL   5. Pt will improve PFDI-7 questionnaire to  pts  score change  to demo improved QOL  Baseline:    ( greater pts indicate greater negative impact on QOL)    134 pts  ( total)   48 pts  ( UIQ-7 )   48 pts  ( CRAIQ-7 )   39  pts  ( POPIQ-7 )  Baseline:  Goal status: INITIAL  6.  Pt wil report decreased urinary pads changes to< 4 pads a day to improve QOL Baseline: changes pads 6 x a day Goal status: INITIAL  7.  Pt will report: Stool type 4   from _0% of the time to > 50% of the time   BMs occurring daily to every other day   Baseline:  stool type 2 occurs 80% of the time, Type 4 across 20% of the time .  BMs occur _ a week   Goal status: INITIAL  8. Pt will report no more pressure /pelvic pain with standing > 30 min in order to cook and shop Baseline: worst with standing  Goal status: INITIAL     Pia Lupe Plump,  PT 05/11/2024, 9:02 AM   "

## 2024-05-18 ENCOUNTER — Ambulatory Visit: Admitting: Physical Therapy

## 2024-05-18 DIAGNOSIS — M6281 Muscle weakness (generalized): Secondary | ICD-10-CM

## 2024-05-18 DIAGNOSIS — R2689 Other abnormalities of gait and mobility: Secondary | ICD-10-CM | POA: Diagnosis not present

## 2024-05-18 DIAGNOSIS — R279 Unspecified lack of coordination: Secondary | ICD-10-CM

## 2024-05-18 DIAGNOSIS — M4125 Other idiopathic scoliosis, thoracolumbar region: Secondary | ICD-10-CM

## 2024-05-18 DIAGNOSIS — M533 Sacrococcygeal disorders, not elsewhere classified: Secondary | ICD-10-CM

## 2024-05-18 DIAGNOSIS — R102 Pelvic and perineal pain unspecified side: Secondary | ICD-10-CM
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
## 2024-05-18 NOTE — Patient Instructions (Signed)
" °  Lengthen Back rib by R  shoulder   ( winging)  Lie on L  side , pillow between knees and under head  Pull  R arm overhead over mattress, grab the edge of mattress,pull it upward, drawing elbow away from ears  Breathing 10 reps Brushing arm with 3/4 turn onto pillow behind back  Lying on L  side ,Pillow/ Block between knees  dragging top forearm across ribs below breast rotating 3/4 turn,  rotating  _R_ only this week ,  relax onto the pillow behind the back, catch yourself when wanting to tighten and roll forward, make sure to keep chin tucked   and then back to other palm , maintain top palm on body whole top and not lift shoulder Do this side this week       Wait do both sides until we have levelled out your spine  __  Shoulder roll backwards when seating  __   Avoid straining pelvic floor, abdominal muscles , spine  Use log rolling technique instead of getting out of bed with your neck or the sit-up     Log rolling into and out of bed   Log rolling into and out of bed If getting out of bed on R side, Bent knees, scoot hips/ shoulder to L  Raise R arm completely overhead, rolling onto armpit  Then lower bent knees to bed to get into complete side lying position  Then drop legs off bed, and push up onto R elbow/forearm, and use L hand to push onto the bed    Dig elbows and feet to lift hte buttocks and scoot without lifting head   "

## 2024-05-25 ENCOUNTER — Other Ambulatory Visit: Payer: Self-pay | Admitting: Obstetrics and Gynecology

## 2024-05-25 ENCOUNTER — Ambulatory Visit: Admitting: Physical Therapy

## 2024-05-25 DIAGNOSIS — K580 Irritable bowel syndrome with diarrhea: Secondary | ICD-10-CM

## 2024-06-01 ENCOUNTER — Ambulatory Visit: Admitting: Physical Therapy

## 2024-06-01 DIAGNOSIS — M4125 Other idiopathic scoliosis, thoracolumbar region: Secondary | ICD-10-CM

## 2024-06-01 DIAGNOSIS — R279 Unspecified lack of coordination: Secondary | ICD-10-CM

## 2024-06-01 DIAGNOSIS — R102 Pelvic and perineal pain unspecified side: Secondary | ICD-10-CM

## 2024-06-01 DIAGNOSIS — R2689 Other abnormalities of gait and mobility: Secondary | ICD-10-CM

## 2024-06-01 DIAGNOSIS — M6281 Muscle weakness (generalized): Secondary | ICD-10-CM

## 2024-06-01 DIAGNOSIS — M533 Sacrococcygeal disorders, not elsewhere classified: Secondary | ICD-10-CM

## 2024-06-01 NOTE — Patient Instructions (Signed)
" °  ZigZag stretch  Reclined twist for hips and side of the hips/ legs  Lay on your back, knees bend, dig elbows and feet into bed to exhale and lift buttocks up to  Scoot hips and feet  to the L , leave shoulders in place Wobble knees to the R side 45 deg and to midline  10 reps   Then reach for R thigh with R hand cross body, straighten knee and point toes toward face, bend knee and repeat straigthen 10 reps to lengthen hamstring and ITband (side of hip)   Repeat on other side: Scoot hip and feet  R ,  leave shoulders in place Wobble knees to the L side 45 deg and to midline  10 reps   Then reach for L thigh with R hand cross body, straighten knee and point toes toward face, bend knee and repeat straigthen 10 reps to lengthen hamstring and ITband (side of hip)   __  Angel wings:  Elbows bent, dragging forearm and wrists and arm on the bed   10 reps  "

## 2024-06-01 NOTE — Therapy (Signed)
 "  OUTPATIENT PHYSICAL THERAPY  TREATMENT    Patient Name: [name] MRN: [id number] DOB:1950-08-01, 74 y.o., female Today's Date: 06/01/2024   PT End of Session - 06/01/24 1020     Visit Number 4    Number of Visits 10    Date for Recertification  07/10/24    PT Start Time 1020    PT Stop Time 1100    PT Time Calculation (min) 40 min    Activity Tolerance Patient tolerated treatment well;No increased pain    Behavior During Therapy [facility] for tasks assessed/performed          Past Medical History:  Diagnosis Date   ADHD (attention deficit hyperactivity disorder) 07/24/2019   Allergy    Anemia 2024   Anxiety    Cataract    Chronic diastolic CHF (congestive heart failure) (HCC)    cardriologist--- dr lelon. barbaraann;   dx in setting sepsis 11/ 2020;   echo in epic 03-10-2019 ef 55-60%, G1DD, mild to moderate TR no stenosis and normal RASP;  CTA 01-31-2021 epic   Chronic kidney disease (CKD), stage III (moderate) (HCC)    nephrologist--- dr val   Depression    Difficulty urinating 01/25/2023   Diverticulosis of colon    Dysuria 10/04/2023   Emphysema of lung (HCC)    no home oxygen   GERD (gastroesophageal reflux disease)    History of acute pyelonephritis    w/ hx sepsis   History of Helicobacter pylori infection 2012   History of recurrent UTIs    History of TIA (transient ischemic attack)    TIAs   Hyperlipidemia, mixed    Hypertension    followed by pcp and nephrologist----  (nuclear stress test in epic 06-28-2015 low risk no ischemia, nuclear ef 60%) no meds   Hypothyroidism    IBS (irritable bowel syndrome)    IC (interstitial cystitis)    remote hx   Inguinal hernia, bilateral    Irritable bowel syndrome with constipation and diarrhea    LBBB (left bundle branch block) 2015   cardiologist --- dr barbaraann;  known chronic LBBB for several yrs   Medication management 06/12/2014   OCD (obsessive compulsive disorder)    OSA (obstructive sleep apnea)    no  CPAP intolerant   Osteopenia    Prolapsed hemorrhoids    Seasonal allergies    Sleep apnea    Vitamin D  deficiency    Past Surgical History:  Procedure Laterality Date   ABDOMINAL HYSTERECTOMY  2000   ANTERIOR AND POSTERIOR REPAIR WITH SACROSPINOUS FIXATION N/A 11/10/2021   Procedure: POSTERIOR REPAIR WITH SACROSPINOUS FIXATION;  Surgeon: Marilynne Rosaline SAILOR, MD;  Location: [facility];  Service: Gynecology;  Laterality: N/A;  total time requested is 1.5 hrs   BLADDER SUSPENSION N/A 11/10/2021   Procedure: TRANSVAGINAL TAPE (TVT) PROCEDURE;  Surgeon: Marilynne Rosaline SAILOR, MD;  Location: [facility];  Service: Gynecology;  Laterality: N/A;   BREAST ENHANCEMENT SURGERY Bilateral    1970s   BREAST IMPLANT REMOVAL Bilateral 03/07/2019   BREAST SURGERY  2020   Removal breast implants   BUNIONECTOMY Bilateral 1997   CATARACT EXTRACTION W/ INTRAOCULAR LENS IMPLANT Right 2005   COLONOSCOPY  10/2020   COSMETIC SURGERY  1976   Breast implants/now removed   CYSTOSCOPY WITH FULGERATION  11/10/2021   Procedure: CYSTOSCOPY WITH FULGERATION;  Surgeon: Marilynne Rosaline SAILOR, MD;  Location: [facility];  Service: Gynecology;;   ESOPHAGOGASTRODUODENOSCOPY (EGD) WITH PROPOFOL   N/A 03/12/2019   Procedure: ESOPHAGOGASTRODUODENOSCOPY (EGD) WITH PROPOFOL ;  Surgeon: Rollin Dover, MD;  Location: [facility];  Service: Endoscopy;  Laterality: N/A;   EYE SURGERY     Cataract r eye   FRACTURE SURGERY  2019   Leg ankle foot left   HEMORRHOID SURGERY  1976   HERNIA REPAIR  2020   R and L inguinal   INGUINAL HERNIA REPAIR N/A 04/10/2021   Procedure: LAPAROSCOPIC LEFT INGUINAL HERNIA REPAIR AND RIGHT INGUINAL HERNII REPAIR WITH TAP BLOCK;  Surgeon: Sheldon Standing, MD;  Location: [facility] ;  Service: General;  Laterality: N/A;   ORIF ANKLE FRACTURE Left 08/30/2017   Procedure: OPEN REDUCTION INTERNAL FIXATION (ORIF) ANKLE FRACTURE;  Surgeon:  Fidel Rogue, MD;  Location: [facility] OR;  Service: Orthopedics;  Laterality: Left;   TONSILLECTOMY     CHILD   TUBAL LIGATION  1976   Patient Active Problem List   Diagnosis Date Noted   Carpal tunnel syndrome on both sides 02/11/2024   Postmenopausal estrogen deficiency 02/11/2024   Recurrent UTI 11/29/2023   Overactive bladder 11/29/2023   Iron deficiency anemia 07/27/2023   Diverticular disease 07/01/2023   Decreased anal sphincter tone 01/25/2023   Prolapsed internal hemorrhoids, grade 3 01/25/2023   Coronary atherosclerosis of native coronary artery (per CTA 01/31/2021)  06/14/2021   Inguinal hernia, left 05/05/2021   Interstitial cystitis 06/14/2020   Overweight (BMI 25.0-29.9) 06/13/2020   Former smoker (40 pack year, quit 2004) 08/30/2019   Depression, controlled 07/24/2019   Atherosclerosis of aorta (HCC) by Chest CT scan in 2020  03/22/2019   Emphysema lung (HCC) - per CT 03/09/2019    Chronic diastolic CHF (congestive heart failure) (HCC) 03/11/2019   Mechanical complication of breast prosthesis, subsequent encounter 03/01/2019   Osteopenia 01/16/2019   History of colon polyps 06/28/2018   Closed bimalleolar fracture 08/25/2017   Fracture of ankle 08/23/2017   CKD Stage 3 (HCC) 01/10/2015   IBS  01/10/2015   LBBB (left bundle branch block) 07/24/2013   OCD (obsessive compulsive disorder)    Hypothyroidism 04/05/2013    PCP: Lucius Krabbe  REFERRING PROVIDER: Zuleta, Kailtin, NP  REFERRING DIAG: R10.20 (ICD-10-CM) - Pelvic pain   Rationale for Evaluation and Treatment Rehabilitation  THERAPY DIAG:  Other abnormalities of gait and mobility  Other idiopathic scoliosis, thoracolumbar region  Muscle weakness (generalized)  Sacrococcygeal disorders, not elsewhere classified  Unspecified lack of coordination  Pelvic pain  ONSET DATE: after her botox  injections   SUBJECTIVE:  SUBJECTIVE STATEMENT TODAY:  Pt reported she shoveled snow for a total of 15 hours to clear her driveway . Pt felt her R shoulder bothered her after shoveling. Pt noticed more diarrhea with the anxiety of whether or not she can clear her driveway so she can get her mattress delivered.   SUBJECTIVE STATEMENT ON EVAL 05/02/24 :  1) urinary leakage: Pt changes pads 6 x a day. Tried pelvic PT twice once at hospital setting and private setting along with PTNS with [facility] but these Tx did not help her urinary leakage issue.              One Pelvic PT who recommended using pelvic wand but pt did not enjoy using the wand and felt she was raw in her vaginal area.                 2) pelvic pain started about receiving botox  Tx for urinary frequency, urgency, and IC related Sx  . Pain is worst  when she has BMs and when she delays urination. Pain is relieved after bowel movements and urination. She notices a pressure sensation in her vaginal area but no bulging. Pressure sensation is worst with standing    3) constipation / diarrhea:  stool type 2 occurs 80% of the time, Type 4 across 20% of the time . Drinks 4 ( 16 fl oz) of water , Drinks coffe alternative Caffix , no soda  . Takes Immodium,  Fecal leakage resolved with hemorrhoid surgery    Interdisciplinary Notes:   ED visit 11/10/23 with ED note stating:  Patient with dysuria.  History of interstitial cystitis and potential urinary retention.  Also had recent urine culture that was positive.  Has had Klebsiella previously but now E. coli.Differential diagnosis includes interstitial cystitis and also UTI.  F/u with urogynologist Dr. Janina 11/17/23: who recommended trying Pelvic PT again and also discussed management of pelvic floor muscle spasm with trigger point injections.    PERTINENT HISTORY:  OSA  -no CPAP,  intolerant LBBB CHF CAD IC IBS  Dysuria Recurrent UTI  L inguinal hernia  History of TIA (transient ischemic attack)  Hyperlipidemia, mixed Hypertension  Hypothyroidism  Osteopenia Closed bimalleolar Fx Fx of ankle   ADHD Anxiety Depression Diverticulosis of colon Prolapsed hermorrhoids,  Surgeries: Robotic Rectopexy 07/2023  Anterior and posterior repair with sacrospinous fixation, bladder suspension, Hysterectomy Hemorrhoid surgery B Bunionectomy ORIF ankle L  Breast enhancement and later implants removed    PAIN:  Are you having pain? Yes: see above  PRECAUTIONS: osteopenia, Hx of TIA, BBBB  WEIGHT BEARING RESTRICTIONS:   FALLS:  Has patient fallen in last 6 months?   LIVING ENVIRONMENT: Lives with:  Lives in:  Stairs:  Has following equipment at home:   OCCUPATION: retired [employer]   PLOF: IND  PATIENT GOALS:   Improve leakage and pelvic pain  OBJECTIVE:   [facility] PT Assessment - 06/01/24 1025       Palpation   SI assessment  levelled pelvis, R shoudler slightly lowered    Palpation comment tightness along L parapinals          [facility] Adult PT Treatment/Exercise - 06/01/24 1329       Neuro Re-ed    Neuro Re-ed Details  provided propicoeption cues and motor planning to stretch back to shoulders and to improve shoulder alignment      Manual Therapy   Manual therapy comments STM/MWM to promote levelled spine and promote diaphragmatic excursion to progress  towards deep core training               HOME EXERCISE PROGRAM: See pt instruction section    ASSESSMENT:  CLINICAL IMPRESSION:   Pt showed good carry over with realigned pelvis but R shoulder was lowered. Pt had shoveled snow for total of 15 hours the past week.   Applied manual Tx to C/T and scapular, thoracic area to promote excursion of diaphragm for optimal deep core function    Provided propicoeption cues and motor planning to stretch back to shoulders and to improve  shoulder alignment.   Plan to progress to cervicoscapular stabilization HEP next session and then progress to deep core training at upcoming session  Continued to provide cues for logrolling technique.  Plan to address realignment of spine/ pelvis at next session to help promote optimize intra-abdominal pressure system (IAP)  for improved pelvic floor function, trunk stability, gait, balance, stabilization with mobility tasks.  Plan to address pelvic floor issues once pelvis and spine are realigned to yield better outcomes.    Regional interdependent approaches will yield greater benefits in pt's POC due to the complexity of pt's medical Hx and the significant impact their Sx have had on their QOL. Pt would benefit from a biopsychosocial approach to yield optimal outcomes. Plan to build interdisciplinary team with pt's providers to optimize patient-centered care as needed.                                                      Pt benefits from skilled PT to address: 1) urinary leakage             2) pelvic pain/ pressure 3) constipation / diarrhea   OBJECTIVE IMPAIRMENTS decreased activity tolerance, decreased coordination, decreased endurance, decreased mobility, difficulty walking, decreased ROM, decreased strength, decreased safety awareness, hypomobility, increased muscle spasms, impaired flexibility, improper body mechanics, postural dysfunction, and pain. scar restrictions   ACTIVITY LIMITATIONS  self-care,  community activities,  home chores, work tasks    PARTICIPATION LIMITATIONS:  community, activities    PERSONAL FACTORS   affecting patient's functional outcome:    REHAB POTENTIAL: Good   CLINICAL DECISION MAKING: Evolving/moderate complexity   EVALUATION COMPLEXITY: Moderate    PATIENT EDUCATION:    Education details: Showed pt anatomy images. Explained muscles attachments/ connection, physiology of deep core system/ spinal- thoracic-pelvis-lower kinetic chain as they  relate to pt's presentation, Sx, and past Hx. Explained what and how these areas of deficits need to be restored to balance and function    See Therapeutic activity / neuromuscular re-education section  Answered pt's questions.   Person educated: Patient Education method: Explanation, Demonstration, Tactile cues, Verbal cues, and Handouts Education comprehension: verbalized understanding, returned demonstration, verbal cues required, tactile cues required, and needs further education     PLAN: PT FREQUENCY: 1x/week   PT DURATION: 10 weeks   PLANNED INTERVENTIONS:   Gait training;Stair training;Functional mobility training;DME Instruction;Therapeutic activities;Therapeutic exercise;Balance training;Neuromuscular re-education;Patient/family education;Vestibular;Visual/perceptual remediation/compensation;Passive range of motion;Moist Heat;Cryotherapy;Traction;Canalith Repostioning;Joint Manipulations;Manual lymph drainage;Manual techniques;Scar mobilization;Energy conservation;Dry needling;ADLs/Self Care Home Management;Biofeedback;Electrical Stimulation;Taping    PLAN FOR NEXT SESSION: See clinical impression for plan     GOALS: Goals reviewed with patient? Yes  SHORT TERM GOALS: Target date: 05/30/2024    Pt will demo IND with HEP  Baseline: Not IND            Goal status: INITIAL   LONG TERM GOALS: Target date: 07/10/2024    1.Pt will demo proper deep core coordination without chest breathing and optimal excursion of diaphragm/pelvic floor in order to promote spinal stability and pelvic floor function  Baseline: dyscoordination Goal status: INITIAL  2.  Pt will demo proper body mechanics in against gravity tasks and ADLs  work tasks, fitness  to minimize straining pelvic floor / back    Baseline: not IND, improper form that places strain on pelvic floor  Goal status: INITIAL    3. Pt will demo increased gait speed > 1.3 m/s with reciprocal gait  pattern, longer stride length  in order to ambulate safely in community and return to fitness routine  Baseline: 1.03 m/s, R pelvis sway, L scissoring , minimal trunk rotation, limited posterior rotation of L shoulder  Goal status: INITIAL    4. Pt will demo levelled pelvic girdle and shoulder height in order to progress to deep core strengthening HEP and restore mobility at spine, pelvis, gait, posture minimize falls, and improve balance  Baseline: R shoulder/ iliac crest higher ( 1 finger width between iliacc rest and low rib), L side ( 2 fingers width),  Goal status: INITIAL   5. Pt will improve PFDI-7 questionnaire to  pts  score change  to demo improved QOL  Baseline:    ( greater pts indicate greater negative impact on QOL)    134 pts  ( total)   48 pts  ( UIQ-7 )   48 pts  ( CRAIQ-7 )   39  pts  ( POPIQ-7 )  Baseline:  Goal status: INITIAL  6.  Pt wil report decreased urinary pads changes to< 4 pads a day to improve QOL Baseline: changes pads 6 x a day Goal status: INITIAL  7.  Pt will report: Stool type 4   from _0% of the time to > 50% of the time   BMs occurring daily to every other day   Baseline:  stool type 2 occurs 80% of the time, Type 4 across 20% of the time .  BMs occur _ a week   Goal status: INITIAL  8. Pt will report no more pressure /pelvic pain with standing > 30 min in order to cook and shop Baseline: worst with standing  Goal status: INITIAL     Pia Lupe Plump, PT 06/01/2024, 10:29 AM   "

## 2024-06-08 ENCOUNTER — Ambulatory Visit: Admitting: Physical Therapy

## 2024-06-15 ENCOUNTER — Ambulatory Visit: Admitting: Physical Therapy

## 2024-06-22 ENCOUNTER — Ambulatory Visit: Admitting: Physical Therapy

## 2024-06-29 ENCOUNTER — Ambulatory Visit: Admitting: Physical Therapy

## 2024-07-06 ENCOUNTER — Ambulatory Visit: Admitting: Physical Therapy

## 2024-07-13 ENCOUNTER — Ambulatory Visit: Admitting: Physical Therapy
